# Patient Record
Sex: Male | Born: 1945 | Race: White | Hispanic: No | Marital: Married | Smoking: Never smoker
Health system: Southern US, Community
[De-identification: ages and names within clinical notes are randomized; demographics above are authoritative.]

## PROBLEM LIST (undated history)

## (undated) DIAGNOSIS — I10 Essential (primary) hypertension: Secondary | ICD-10-CM

## (undated) DIAGNOSIS — I509 Heart failure, unspecified: Secondary | ICD-10-CM

## (undated) DIAGNOSIS — K602 Anal fissure, unspecified: Secondary | ICD-10-CM

## (undated) DIAGNOSIS — K5900 Constipation, unspecified: Secondary | ICD-10-CM

## (undated) DIAGNOSIS — G4733 Obstructive sleep apnea (adult) (pediatric): Secondary | ICD-10-CM

## (undated) DIAGNOSIS — G4730 Sleep apnea, unspecified: Secondary | ICD-10-CM

## (undated) DIAGNOSIS — L4050 Arthropathic psoriasis, unspecified: Secondary | ICD-10-CM

## (undated) DIAGNOSIS — H9319 Tinnitus, unspecified ear: Secondary | ICD-10-CM

## (undated) DIAGNOSIS — Z8739 Personal history of other diseases of the musculoskeletal system and connective tissue: Secondary | ICD-10-CM

## (undated) DIAGNOSIS — R011 Cardiac murmur, unspecified: Secondary | ICD-10-CM

## (undated) DIAGNOSIS — M1990 Unspecified osteoarthritis, unspecified site: Secondary | ICD-10-CM

## (undated) DIAGNOSIS — Z85828 Personal history of other malignant neoplasm of skin: Secondary | ICD-10-CM

## (undated) HISTORY — PX: OTHER SURGICAL HISTORY: SHX169

## (undated) HISTORY — DX: Essential (primary) hypertension: I10

## (undated) HISTORY — DX: Anal fissure, unspecified: K60.2

## (undated) HISTORY — DX: Sleep apnea, unspecified: G47.30

## (undated) HISTORY — DX: Cardiac murmur, unspecified: R01.1

## (undated) HISTORY — PX: TONSILLECTOMY: SUR1361

## (undated) HISTORY — DX: Obstructive sleep apnea (adult) (pediatric): G47.33

## (undated) HISTORY — DX: Unspecified osteoarthritis, unspecified site: M19.90

## (undated) HISTORY — PX: LUNG BIOPSY: SHX232

---

## 1997-09-30 ENCOUNTER — Ambulatory Visit (HOSPITAL_COMMUNITY): Admission: RE | Admit: 1997-09-30 | Discharge: 1997-09-30 | Payer: Self-pay | Admitting: Pulmonary Disease

## 1997-09-30 ENCOUNTER — Encounter: Payer: Self-pay | Admitting: Pulmonary Disease

## 1998-01-24 ENCOUNTER — Encounter: Payer: Self-pay | Admitting: Pulmonary Disease

## 1998-01-24 ENCOUNTER — Ambulatory Visit: Admission: RE | Admit: 1998-01-24 | Discharge: 1998-01-24 | Payer: Self-pay | Admitting: Pulmonary Disease

## 2001-07-09 ENCOUNTER — Ambulatory Visit: Admission: RE | Admit: 2001-07-09 | Discharge: 2001-07-09 | Payer: Self-pay | Admitting: Internal Medicine

## 2005-01-16 ENCOUNTER — Ambulatory Visit (HOSPITAL_COMMUNITY): Admission: RE | Admit: 2005-01-16 | Discharge: 2005-01-16 | Payer: Self-pay | Admitting: Internal Medicine

## 2005-01-17 ENCOUNTER — Ambulatory Visit (HOSPITAL_COMMUNITY): Admission: RE | Admit: 2005-01-17 | Discharge: 2005-01-17 | Payer: Self-pay | Admitting: Internal Medicine

## 2006-02-22 ENCOUNTER — Encounter: Admission: RE | Admit: 2006-02-22 | Discharge: 2006-02-22 | Payer: Self-pay | Admitting: Internal Medicine

## 2006-02-28 ENCOUNTER — Encounter: Admission: RE | Admit: 2006-02-28 | Discharge: 2006-02-28 | Payer: Self-pay | Admitting: Internal Medicine

## 2006-11-21 ENCOUNTER — Encounter: Admission: RE | Admit: 2006-11-21 | Discharge: 2006-11-21 | Payer: Self-pay | Admitting: Internal Medicine

## 2008-06-14 ENCOUNTER — Ambulatory Visit (HOSPITAL_COMMUNITY): Admission: RE | Admit: 2008-06-14 | Discharge: 2008-06-14 | Payer: Self-pay | Admitting: Pulmonary Disease

## 2009-04-04 DIAGNOSIS — D869 Sarcoidosis, unspecified: Secondary | ICD-10-CM | POA: Insufficient documentation

## 2009-04-04 DIAGNOSIS — I10 Essential (primary) hypertension: Secondary | ICD-10-CM | POA: Insufficient documentation

## 2009-04-04 DIAGNOSIS — G4733 Obstructive sleep apnea (adult) (pediatric): Secondary | ICD-10-CM | POA: Insufficient documentation

## 2009-04-04 DIAGNOSIS — E119 Type 2 diabetes mellitus without complications: Secondary | ICD-10-CM | POA: Insufficient documentation

## 2009-04-05 ENCOUNTER — Ambulatory Visit: Payer: Self-pay | Admitting: Pulmonary Disease

## 2009-05-16 ENCOUNTER — Encounter: Payer: Self-pay | Admitting: Pulmonary Disease

## 2009-05-17 ENCOUNTER — Encounter: Payer: Self-pay | Admitting: Pulmonary Disease

## 2009-12-27 ENCOUNTER — Ambulatory Visit: Payer: Self-pay | Admitting: Cardiology

## 2010-01-02 ENCOUNTER — Telehealth (INDEPENDENT_AMBULATORY_CARE_PROVIDER_SITE_OTHER): Payer: Self-pay | Admitting: *Deleted

## 2010-01-03 ENCOUNTER — Encounter (HOSPITAL_COMMUNITY)
Admission: RE | Admit: 2010-01-03 | Discharge: 2010-03-14 | Payer: Self-pay | Source: Home / Self Care | Attending: Cardiology | Admitting: Cardiology

## 2010-01-03 ENCOUNTER — Encounter: Payer: Self-pay | Admitting: Internal Medicine

## 2010-01-03 ENCOUNTER — Ambulatory Visit: Payer: Self-pay

## 2010-01-03 ENCOUNTER — Ambulatory Visit: Payer: Self-pay | Admitting: Internal Medicine

## 2010-01-04 ENCOUNTER — Ambulatory Visit: Payer: Self-pay

## 2010-01-27 ENCOUNTER — Ambulatory Visit (HOSPITAL_BASED_OUTPATIENT_CLINIC_OR_DEPARTMENT_OTHER): Payer: BC Managed Care – PPO | Admitting: Oncology

## 2010-02-04 ENCOUNTER — Emergency Department (HOSPITAL_BASED_OUTPATIENT_CLINIC_OR_DEPARTMENT_OTHER)
Admission: EM | Admit: 2010-02-04 | Discharge: 2010-02-04 | Payer: Self-pay | Source: Home / Self Care | Admitting: Emergency Medicine

## 2010-02-12 HISTORY — PX: JOINT REPLACEMENT: SHX530

## 2010-02-23 LAB — CBC WITH DIFFERENTIAL/PLATELET
BASO%: 0.4 % (ref 0.0–2.0)
Basophils Absolute: 0 10*3/uL (ref 0.0–0.1)
EOS%: 3.8 % (ref 0.0–7.0)
Eosinophils Absolute: 0.3 10*3/uL (ref 0.0–0.5)
HCT: 50 % — ABNORMAL HIGH (ref 38.4–49.9)
HGB: 17.4 g/dL — ABNORMAL HIGH (ref 13.0–17.1)
LYMPH%: 15.9 % (ref 14.0–49.0)
MCH: 30.6 pg (ref 27.2–33.4)
MCHC: 34.8 g/dL (ref 32.0–36.0)
MCV: 87.9 fL (ref 79.3–98.0)
MONO#: 0.6 10*3/uL (ref 0.1–0.9)
MONO%: 7.9 % (ref 0.0–14.0)
NEUT#: 5.1 10*3/uL (ref 1.5–6.5)
NEUT%: 72 % (ref 39.0–75.0)
Platelets: 157 10*3/uL (ref 140–400)
RBC: 5.69 10*6/uL (ref 4.20–5.82)
RDW: 13.6 % (ref 11.0–14.6)
WBC: 7.1 10*3/uL (ref 4.0–10.3)
lymph#: 1.1 10*3/uL (ref 0.9–3.3)
nRBC: 0 % (ref 0–0)

## 2010-02-23 LAB — MORPHOLOGY: PLT EST: ADEQUATE

## 2010-02-23 LAB — CHCC SMEAR

## 2010-02-24 LAB — COMPREHENSIVE METABOLIC PANEL
ALT: 16 U/L (ref 0–53)
AST: 19 U/L (ref 0–37)
Albumin: 4.3 g/dL (ref 3.5–5.2)
Alkaline Phosphatase: 59 U/L (ref 39–117)
BUN: 16 mg/dL (ref 6–23)
CO2: 27 mEq/L (ref 19–32)
Calcium: 9.4 mg/dL (ref 8.4–10.5)
Chloride: 100 mEq/L (ref 96–112)
Creatinine, Ser: 1.06 mg/dL (ref 0.40–1.50)
Glucose, Bld: 111 mg/dL — ABNORMAL HIGH (ref 70–99)
Potassium: 3.8 mEq/L (ref 3.5–5.3)
Sodium: 137 mEq/L (ref 135–145)
Total Bilirubin: 1.3 mg/dL — ABNORMAL HIGH (ref 0.3–1.2)
Total Protein: 6.3 g/dL (ref 6.0–8.3)

## 2010-02-24 LAB — LACTATE DEHYDROGENASE: LDH: 171 U/L (ref 94–250)

## 2010-02-24 LAB — URIC ACID: Uric Acid, Serum: 8.1 mg/dL — ABNORMAL HIGH (ref 4.0–7.8)

## 2010-02-24 LAB — ERYTHROPOIETIN: Erythropoietin: 18.4 m[IU]/mL (ref 2.6–34.0)

## 2010-02-28 LAB — JAK-2 V617F

## 2010-03-14 NOTE — Progress Notes (Signed)
Summary: Nuclear pre procedure  Phone Note Outgoing Call Call back at [facility] Phone [phone]   Call placed by: Rea College, CMA,  January 02, 2010 4:30 PM Call placed to: Patient Summary of Call: Left message with information on [employer] Information Sheet (see scanned document for details).      Nuclear Med Background Indications for Stress Test: Evaluation for Ischemia, Surgical Clearance  Indications Comments: Pending (R) TKR by Dr. Dimas Aguas  History: Echo, Myocardial Perfusion Study  History Comments: '02 [id number], EF=51%  Symptoms: SOB    Nuclear Pre-Procedure Cardiac Risk Factors: Hypertension, NIDDM, Obesity Height (in): 71

## 2010-03-14 NOTE — Miscellaneous (Signed)
Summary: Orders Update  Clinical Lists Changes  Orders: Added new Referral order of DME Referral (DME) - Signed 

## 2010-03-14 NOTE — Letter (Signed)
Summary: External Correspondence  External Correspondence   Imported By: Valinda Hoar 05/17/2009 08:38:32  _____________________________________________________________________  External Attachment:    Type:   Image     Comment:   External Document

## 2010-03-14 NOTE — Assessment & Plan Note (Signed)
Summary: re-establish for management of osa   Copy to:  [place] Primary Provider/Referring Provider:  Marisue Brooklyn  CC:  Sleep Apnea Cons - Former pt - Pt still wears CPAP.  History of Present Illness: The pt comes in today to re-establish for management of his osa.  He was diagnosed with severe osa in 1999, and has been on cpap since that time.  He was last seen in 2005, but has been very compliant with his device.  He has kept up with supplies and mask exchanges.  He has lost 35 pounds since the last visit with me.  He feels that he is sleeping well, and that he is getting restorative sleep, but may not be sleeping quite as well as in past.   Current Medications (verified): 1)  Aspirin Low Dose 81 Mg Tabs (Aspirin) .... Take 1 Tablet By Mouth Once A Day 2)  Actos 30 Mg Tabs (Pioglitazone Hcl) .... Take 1 Tablet By Mouth Once A Day 3)  Atenolol 50 Mg Tabs (Atenolol) .... Take 1 Tablet By Mouth Once A Day 4)  Ramipril 5 Mg Caps (Ramipril) .... Take One By Mouth Once Daily 5)  Maxzide 75-50 Mg Tabs (Triamterene-Hctz) .... Take 1 Tablet By Mouth Once A Day 6)  Zocor 40 Mg Tabs (Simvastatin) .... Take 1 Tablet By Mouth Once A Day 7)  Vitamin D 2000 Unit Caps (Cholecalciferol) .... Take One By Mouth Once Daily Monday Through Friday 8)  Potassium Chloride 20 Meq Pack (Potassium Chloride) .... Take One  By Mouth Once Daily 9)  Hyalgan 20 Mg/31ml Soln (Sodium Hyaluronate (Viscosup)) .... Injection Into Knee As Needed 10)  Aleve 220 Mg Tabs (Naproxen Sodium) .... Take As Needed  Allergies (verified): 1)  ! Levaquin 2)  ! Penicillin  Past History:  Past Medical History: Last updated: 04/04/2009 Current Problems:  DM (ICD-250.00) HYPERTENSION (ICD-401.9) SARCOIDOSIS (ICD-135) OBSTRUCTIVE SLEEP APNEA (ICD-327.23)    Family History: emphysema: father clotting disorder: father  heart disease : mother  Social History: Pt retired.  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    Married  Children not living at home. Alcohol - 1 or 2 per mth  Review of Systems       The patient complains of hand/feet swelling.  The patient denies shortness of breath with activity, shortness of breath at rest, productive cough, non-productive cough, coughing up blood, chest pain, irregular heartbeats, acid heartburn, indigestion, loss of appetite, weight change, abdominal pain, difficulty swallowing, sore throat, tooth/dental problems, headaches, nasal congestion/difficulty breathing through nose, sneezing, itching, ear ache, anxiety, depression, joint stiffness or pain, rash, change in color of mucus, and fever.    Vital Signs:  Patient profile:   65 year old male Height:      71 inches Weight:      330.13 pounds BMI:     46.21 O2 Sat:      97 % on Room air Temp:     98.1 degrees F oral Pulse rate:   65 / minute BP sitting:   102 / 66  (right arm) Cuff size:   large  Vitals Entered By: Abigail Miyamoto RN (April 05, 2009 11:33 AM)  O2 Flow:  Room air  Physical Exam  General:  obese male in nad Nose:  no skin breakdown or pressure necrosis from cpap mask Lungs:  clear Heart:  rrr Extremities:  mild edema, but no cyanosis Neurologic:  alert, but mildly sleepy, moves all 4.   Impression & Recommendations:  Problem # 1:  OBSTRUCTIVE SLEEP APNEA (ICD-327.23) the pt has a known h/o severe osa, and has been wearing cpap compliantly.  He feels that he sleep well, but not as good as in the past.  His weight is actually down since the last visit here.  It is unclear if he could be losing pressure from mouth opening, or whether his pressure needs have changed.  He has not had his pressure checked in a long time, and would take this opportunity to do so with an autotitrating device at home.  I will let him know his optimal pressure.  I have also encouraged him to continue working aggressively on weight loss.  Medications Added to Medication List This Visit: 1)  Ramipril 5 Mg  Caps (Ramipril) .... Take one by mouth once daily 2)  Vitamin D 2000 Unit Caps (Cholecalciferol) .... Take one by mouth once daily monday through friday 3)  Potassium Chloride 20 Meq Pack (Potassium chloride) .... Take one  by mouth once daily 4)  Hyalgan 20 Mg/62ml Soln (Sodium hyaluronate (viscosup)) .... Injection into knee as needed 5)  Aleve 220 Mg Tabs (Naproxen sodium) .... Take as needed  Other Orders: Est. Patient Level IV (29562) DME Referral (DME)  Patient Instructions: 1)  will recheck your pressure with an auto device at home 2)  pay attention to whether you could be "mouth opening" while wearing cpap. 3)  will call you with results of autotitration. 4)  work on weight loss 5)  followup with me in one year.

## 2010-03-16 NOTE — Assessment & Plan Note (Signed)
Summary: Cardiology Nuclear Testing  Nuclear Med Background Indications for Stress Test: Evaluation for Ischemia, Surgical Clearance  Indications Comments: Pending (R) TKR by Dr. Dimas Aguas  History: Echo, Myocardial Perfusion Study  History Comments: '02 [id number], EF=51%  Symptoms: DOE, SOB    Nuclear Pre-Procedure Cardiac Risk Factors: Hypertension, NIDDM, Obesity Caffeine/Decaff Intake: none NPO After: 7:00 PM Lungs: clear IV 0.9% NS with Angio Cath: 22g     IV Site: R Hand IV Started by: Cathlyn Parsons, RN Chest Size (in) 58     Height (in): 71 Weight (lb): 324 BMI: 45.35 Tech Comments: Tenormin held x 24hrs  Nuclear Med Study 1 or 2 day study:  2 day     Stress Test Type:  Treadmill/Lexiscan Reading MD:  Marca Ancona, MD     Referring MD:  S.Tennant Resting Radionuclide:  Technetium 79m Tetrofosmin     Resting Radionuclide Dose:  33 mCi  Stress Radionuclide:  Technetium 30m Tetrofosmin     Stress Radionuclide Dose:  33 mCi   Stress Protocol  Max Systolic BP: 129 mm Hg Lexiscan: 0.4 mg   Stress Test Technologist:  Milana Na, EMT-P     Nuclear Technologist:  Doyne Keel, CNMT  Rest Procedure  Myocardial perfusion imaging was performed at rest 45 minutes following the intravenous administration of Technetium 63m Tetrofosmin.  Stress Procedure  The patient received IV Lexiscan 0.4 mg over 15-seconds.  Technetium 71m Tetrofosmin injected at 30-seconds.  There were no significant changes with infusion.  Quantitative spect images were obtained after a 45 minute delay.  QPS Raw Data Images:  Normal; no motion artifact; normal heart/lung ratio. Stress Images:  Mild thinning of the inferior wall Rest Images:  Mild thinning of the inferior wall Subtraction (SDS):  There is thinning of the inferior wall worse of rest than stress. Suspect diaphragmatic attenuation. Cannot rule out previous infarct. No ischemia. Transient Ischemic Dilatation:  1.13  (Normal  <1.22)  Lung/Heart Ratio:  0.48  (Normal <0.45)  Quantitative Gated Spect Images QGS EDV:  110 ml QGS ESV:  43 ml QGS EF:  61 % QGS cine images:  Normal  Findings Normal nuclear study      Overall Impression  Exercise Capacity: Lexiscan with no exercise. ECG Impression: No significant ST segment change suggestive of ischemia. Overall Impression: Normal stress nuclear study. Overall Impression Comments: There is thinning of the inferior wall worse on rest than stress. Suspect diaphragmatic attenuation. Cannot rule out previous infarct. No ischemia.

## 2010-03-31 ENCOUNTER — Encounter: Payer: Medicare Other | Admitting: Oncology

## 2010-03-31 DIAGNOSIS — D751 Secondary polycythemia: Secondary | ICD-10-CM

## 2010-03-31 LAB — CBC WITH DIFFERENTIAL/PLATELET
BASO%: 1.3 % (ref 0.0–2.0)
Basophils Absolute: 0.1 10*3/uL (ref 0.0–0.1)
EOS%: 3.8 % (ref 0.0–7.0)
Eosinophils Absolute: 0.2 10*3/uL (ref 0.0–0.5)
HCT: 47.7 % (ref 38.4–49.9)
HGB: 16.7 g/dL (ref 13.0–17.1)
LYMPH%: 17.9 % (ref 14.0–49.0)
MCH: 31.5 pg (ref 27.2–33.4)
MCHC: 35 g/dL (ref 32.0–36.0)
MCV: 90.1 fL (ref 79.3–98.0)
MONO#: 0.5 10*3/uL (ref 0.1–0.9)
MONO%: 7.9 % (ref 0.0–14.0)
NEUT#: 4.5 10*3/uL (ref 1.5–6.5)
NEUT%: 69.1 % (ref 39.0–75.0)
Platelets: 167 10*3/uL (ref 140–400)
RBC: 5.3 10*6/uL (ref 4.20–5.82)
RDW: 14.2 % (ref 11.0–14.6)
WBC: 6.4 10*3/uL (ref 4.0–10.3)
lymph#: 1.2 10*3/uL (ref 0.9–3.3)

## 2010-04-04 ENCOUNTER — Ambulatory Visit: Payer: Self-pay | Admitting: Pulmonary Disease

## 2010-05-17 ENCOUNTER — Emergency Department (HOSPITAL_BASED_OUTPATIENT_CLINIC_OR_DEPARTMENT_OTHER)
Admission: EM | Admit: 2010-05-17 | Discharge: 2010-05-17 | Disposition: A | Discharge status: Home / Self Care | Payer: Medicare Other | Attending: Emergency Medicine | Admitting: Emergency Medicine

## 2010-05-17 DIAGNOSIS — G4730 Sleep apnea, unspecified: Secondary | ICD-10-CM | POA: Insufficient documentation

## 2010-05-17 DIAGNOSIS — E119 Type 2 diabetes mellitus without complications: Secondary | ICD-10-CM | POA: Insufficient documentation

## 2010-05-17 DIAGNOSIS — I10 Essential (primary) hypertension: Secondary | ICD-10-CM | POA: Insufficient documentation

## 2010-05-17 DIAGNOSIS — M25579 Pain in unspecified ankle and joints of unspecified foot: Secondary | ICD-10-CM | POA: Insufficient documentation

## 2010-05-17 DIAGNOSIS — IMO0002 Reserved for concepts with insufficient information to code with codable children: Secondary | ICD-10-CM | POA: Insufficient documentation

## 2010-05-17 DIAGNOSIS — Z79899 Other long term (current) drug therapy: Secondary | ICD-10-CM | POA: Insufficient documentation

## 2010-05-23 ENCOUNTER — Other Ambulatory Visit (HOSPITAL_COMMUNITY): Payer: Self-pay | Admitting: Oncology

## 2010-05-23 ENCOUNTER — Encounter (HOSPITAL_BASED_OUTPATIENT_CLINIC_OR_DEPARTMENT_OTHER): Payer: Medicare Other | Admitting: Oncology

## 2010-05-23 DIAGNOSIS — Z96659 Presence of unspecified artificial knee joint: Secondary | ICD-10-CM

## 2010-05-23 DIAGNOSIS — D751 Secondary polycythemia: Secondary | ICD-10-CM

## 2010-05-23 LAB — COMPREHENSIVE METABOLIC PANEL
ALT: 13 U/L (ref 0–53)
AST: 18 U/L (ref 0–37)
Albumin: 3.6 g/dL (ref 3.5–5.2)
Alkaline Phosphatase: 61 U/L (ref 39–117)
BUN: 15 mg/dL (ref 6–23)
CO2: 27 mEq/L (ref 19–32)
Calcium: 9.2 mg/dL (ref 8.4–10.5)
Chloride: 102 mEq/L (ref 96–112)
Creatinine, Ser: 1.07 mg/dL (ref 0.40–1.50)
Glucose, Bld: 145 mg/dL — ABNORMAL HIGH (ref 70–99)
Potassium: 4 mEq/L (ref 3.5–5.3)
Sodium: 140 mEq/L (ref 135–145)
Total Bilirubin: 1 mg/dL (ref 0.3–1.2)
Total Protein: 5.6 g/dL — ABNORMAL LOW (ref 6.0–8.3)

## 2010-05-23 LAB — CBC WITH DIFFERENTIAL/PLATELET
BASO%: 0.6 % (ref 0.0–2.0)
Basophils Absolute: 0 10*3/uL (ref 0.0–0.1)
EOS%: 6.1 % (ref 0.0–7.0)
Eosinophils Absolute: 0.3 10*3/uL (ref 0.0–0.5)
HCT: 43.1 % (ref 38.4–49.9)
HGB: 14.5 g/dL (ref 13.0–17.1)
LYMPH%: 14.3 % (ref 14.0–49.0)
MCH: 30.4 pg (ref 27.2–33.4)
MCHC: 33.7 g/dL (ref 32.0–36.0)
MCV: 90.2 fL (ref 79.3–98.0)
MONO#: 0.3 10*3/uL (ref 0.1–0.9)
MONO%: 6.4 % (ref 0.0–14.0)
NEUT#: 3.9 10*3/uL (ref 1.5–6.5)
NEUT%: 72.6 % (ref 39.0–75.0)
Platelets: 194 10*3/uL (ref 140–400)
RBC: 4.78 10*6/uL (ref 4.20–5.82)
RDW: 14.1 % (ref 11.0–14.6)
WBC: 5.4 10*3/uL (ref 4.0–10.3)
lymph#: 0.8 10*3/uL — ABNORMAL LOW (ref 0.9–3.3)

## 2010-05-23 LAB — LACTATE DEHYDROGENASE: LDH: 164 U/L (ref 94–250)

## 2010-05-29 ENCOUNTER — Encounter: Payer: Self-pay | Admitting: Pulmonary Disease

## 2010-05-30 ENCOUNTER — Encounter: Payer: Self-pay | Admitting: Pulmonary Disease

## 2010-05-30 ENCOUNTER — Ambulatory Visit (INDEPENDENT_AMBULATORY_CARE_PROVIDER_SITE_OTHER): Payer: Medicare Other | Admitting: Pulmonary Disease

## 2010-05-30 VITALS — BP 124/70 | HR 81 | Temp 97.6°F | Ht 72.0 in | Wt 349.8 lb

## 2010-05-30 DIAGNOSIS — G4733 Obstructive sleep apnea (adult) (pediatric): Secondary | ICD-10-CM

## 2010-05-30 NOTE — Assessment & Plan Note (Signed)
The pt is wearing cpap compliantly, but has been noted to have polycythemia.  His weight has increased almost 20 pounds since last visit, and I suspect his pressure needs have increased.  He is also due for a new machine.  At this point, will get him a new machine and set on auto to reoptimize his pressure.  Once this is done, will do ONO on cpap to verify we are maintaining adequate sats.

## 2010-05-30 NOTE — Patient Instructions (Signed)
Will get you a new cpap machine, and set on auto for the next 2 weeks to optimize your pressure.  Call us when download done to give [place] a heads up. After optimizing pressure, will then check oxygen level at night. Work on weight loss followup with me in one year, or sooner if having issues.

## 2010-05-30 NOTE — Progress Notes (Signed)
  Subjective:    Patient ID: Gary Butler, male    DOB: 05-10-1945, 65 y.o.   MRN: [id number]  HPI The pt comes in today for f/u of his known osa.  He is wearing cpap compliantly, and is having no issues with mask fit or pressure.  He feels he is sleeping well, and is satisfied with his daytime alertness.  He is due for a new mask and a new machine.  A new issue for him is polycythemia, and he is being followed by hematology.  The question was raised whether his pressure may be adequate, and whether he may be having desats at night.  He also has been having some LE edema.    Review of Systems  Constitutional: Negative for fever and unexpected weight change.  HENT: Negative for ear pain, nosebleeds, congestion, sore throat, rhinorrhea, sneezing, trouble swallowing, dental problem, postnasal drip and sinus pressure.   Eyes: Negative for redness and itching.  Respiratory: Negative for cough, chest tightness, shortness of breath and wheezing.   Cardiovascular: Positive for leg swelling. Negative for palpitations.  Gastrointestinal: Negative for nausea and vomiting.  Genitourinary: Negative for dysuria.  Musculoskeletal: Positive for joint swelling.  Skin: Negative for rash.  Neurological: Negative for headaches.  Hematological: Does not bruise/bleed easily.  Psychiatric/Behavioral: Negative for dysphoric mood. The patient is not nervous/anxious.        Objective:   Physical Exam Obese male in nad No skin breakdown or pressure necrosis from cpap mask. LE with 1+ edema bilat, no cyanosis Alert and oriented, moves all 4.  Does not appear sleepy.       Assessment & Plan:

## 2010-07-18 ENCOUNTER — Encounter (HOSPITAL_BASED_OUTPATIENT_CLINIC_OR_DEPARTMENT_OTHER): Payer: Medicare Other | Admitting: Oncology

## 2010-07-18 ENCOUNTER — Other Ambulatory Visit (HOSPITAL_COMMUNITY): Payer: Self-pay | Admitting: Oncology

## 2010-07-18 DIAGNOSIS — D751 Secondary polycythemia: Secondary | ICD-10-CM

## 2010-07-18 LAB — CBC WITH DIFFERENTIAL/PLATELET
BASO%: 0.3 % (ref 0.0–2.0)
Basophils Absolute: 0 10*3/uL (ref 0.0–0.1)
EOS%: 3.5 % (ref 0.0–7.0)
Eosinophils Absolute: 0.3 10*3/uL (ref 0.0–0.5)
HCT: 45.8 % (ref 38.4–49.9)
HGB: 15.5 g/dL (ref 13.0–17.1)
LYMPH%: 15.3 % (ref 14.0–49.0)
MCH: 29.1 pg (ref 27.2–33.4)
MCHC: 33.8 g/dL (ref 32.0–36.0)
MCV: 86.1 fL (ref 79.3–98.0)
MONO#: 0.5 10*3/uL (ref 0.1–0.9)
MONO%: 6.2 % (ref 0.0–14.0)
NEUT#: 5.4 10*3/uL (ref 1.5–6.5)
NEUT%: 74.7 % (ref 39.0–75.0)
Platelets: 168 10*3/uL (ref 140–400)
RBC: 5.32 10*6/uL (ref 4.20–5.82)
RDW: 14.5 % (ref 11.0–14.6)
WBC: 7.2 10*3/uL (ref 4.0–10.3)
lymph#: 1.1 10*3/uL (ref 0.9–3.3)
nRBC: 0 % (ref 0–0)

## 2010-07-24 ENCOUNTER — Telehealth: Payer: Self-pay | Admitting: Pulmonary Disease

## 2010-07-24 NOTE — Telephone Encounter (Signed)
Spoke with pt.  He states needing CPAP download results. I advised KC out of the office this wk, and that I do not see where results are back yet, but will forward him the msg and he will address it next wk when returns. Pt okay with this.

## 2010-07-31 ENCOUNTER — Other Ambulatory Visit: Payer: Self-pay | Admitting: Pulmonary Disease

## 2010-07-31 DIAGNOSIS — G4733 Obstructive sleep apnea (adult) (pediatric): Secondary | ICD-10-CM

## 2010-07-31 NOTE — Telephone Encounter (Signed)
Let pt know that his optimal pressure is 15cm Will get cpap set to that pressure, then check ONO to make sure we are keeping his oxygen level up to an acceptable level.  Have him call us as soon as the dme picks up his ONO box, so we can stay on top of getting download in a timely fashion.

## 2010-08-01 NOTE — Telephone Encounter (Signed)
Pt aware. Jennifer Castillo, CMA  

## 2010-10-22 ENCOUNTER — Emergency Department (HOSPITAL_BASED_OUTPATIENT_CLINIC_OR_DEPARTMENT_OTHER)
Admission: EM | Admit: 2010-10-22 | Discharge: 2010-10-22 | Disposition: A | Discharge status: Home / Self Care | Payer: Medicare Other | Attending: Emergency Medicine | Admitting: Emergency Medicine

## 2010-10-22 ENCOUNTER — Encounter (HOSPITAL_BASED_OUTPATIENT_CLINIC_OR_DEPARTMENT_OTHER): Payer: Self-pay | Admitting: *Deleted

## 2010-10-22 ENCOUNTER — Emergency Department (INDEPENDENT_AMBULATORY_CARE_PROVIDER_SITE_OTHER): Payer: Medicare Other

## 2010-10-22 DIAGNOSIS — I10 Essential (primary) hypertension: Secondary | ICD-10-CM | POA: Insufficient documentation

## 2010-10-22 DIAGNOSIS — L089 Local infection of the skin and subcutaneous tissue, unspecified: Secondary | ICD-10-CM | POA: Insufficient documentation

## 2010-10-22 DIAGNOSIS — R609 Edema, unspecified: Secondary | ICD-10-CM

## 2010-10-22 DIAGNOSIS — G4733 Obstructive sleep apnea (adult) (pediatric): Secondary | ICD-10-CM | POA: Insufficient documentation

## 2010-10-22 DIAGNOSIS — E119 Type 2 diabetes mellitus without complications: Secondary | ICD-10-CM | POA: Insufficient documentation

## 2010-10-22 DIAGNOSIS — L539 Erythematous condition, unspecified: Secondary | ICD-10-CM

## 2010-10-22 DIAGNOSIS — B369 Superficial mycosis, unspecified: Secondary | ICD-10-CM

## 2010-10-22 DIAGNOSIS — M79609 Pain in unspecified limb: Secondary | ICD-10-CM | POA: Insufficient documentation

## 2010-10-22 LAB — DIFFERENTIAL
Basophils Absolute: 0 10*3/uL (ref 0.0–0.1)
Basophils Relative: 0 % (ref 0–1)
Eosinophils Absolute: 0.3 10*3/uL (ref 0.0–0.7)
Eosinophils Relative: 4 % (ref 0–5)
Lymphocytes Relative: 13 % (ref 12–46)
Lymphs Abs: 0.9 10*3/uL (ref 0.7–4.0)
Monocytes Absolute: 0.5 10*3/uL (ref 0.1–1.0)
Monocytes Relative: 7 % (ref 3–12)
Neutro Abs: 5.3 10*3/uL (ref 1.7–7.7)
Neutrophils Relative %: 75 % (ref 43–77)

## 2010-10-22 LAB — COMPREHENSIVE METABOLIC PANEL
ALT: 15 U/L (ref 0–53)
AST: 24 U/L (ref 0–37)
Albumin: 3.7 g/dL (ref 3.5–5.2)
Alkaline Phosphatase: 79 U/L (ref 39–117)
BUN: 16 mg/dL (ref 6–23)
CO2: 29 mEq/L (ref 19–32)
Calcium: 9.7 mg/dL (ref 8.4–10.5)
Chloride: 99 mEq/L (ref 96–112)
Creatinine, Ser: 0.8 mg/dL (ref 0.50–1.35)
GFR calc Af Amer: >60 mL/min (ref >60)
GFR calc non Af Amer: >60 mL/min (ref >60)
Glucose, Bld: 148 mg/dL — ABNORMAL HIGH (ref 70–99)
Potassium: 3.3 mEq/L — ABNORMAL LOW (ref 3.5–5.1)
Sodium: 138 mEq/L (ref 135–145)
Total Bilirubin: 1 mg/dL (ref 0.3–1.2)
Total Protein: 6.5 g/dL (ref 6.0–8.3)

## 2010-10-22 LAB — CBC
HCT: 47.2 % (ref 39.0–52.0)
Hemoglobin: 16.7 g/dL (ref 13.0–17.0)
MCH: 30.6 pg (ref 26.0–34.0)
MCHC: 35.4 g/dL (ref 30.0–36.0)
MCV: 86.6 fL (ref 78.0–100.0)
Platelets: 160 10*3/uL (ref 150–400)
RBC: 5.45 MIL/uL (ref 4.22–5.81)
RDW: 15.1 % (ref 11.5–15.5)
WBC: 7.1 10*3/uL (ref 4.0–10.5)

## 2010-10-22 MED ORDER — DOXYCYCLINE HYCLATE 100 MG PO TABS: 100.0000 mg | ORAL_TABLET | Freq: Once | ORAL | Status: DC

## 2010-10-22 MED ORDER — HYDROCODONE-ACETAMINOPHEN 5-325 MG PO TABS: 2.0000 | ORAL_TABLET | Freq: Once | ORAL | Status: DC

## 2010-10-22 MED ORDER — DOXYCYCLINE HYCLATE 100 MG PO CAPS: 100.0000 mg | ORAL_CAPSULE | Freq: Two times a day (BID) | ORAL | Status: AC

## 2010-10-22 MED ORDER — DEXTROSE 5 % IV SOLN
1.0000 g | INTRAVENOUS | Status: DC
  Administered 2010-10-22 (×2): 1 g via INTRAVENOUS
  Filled 2010-10-22: qty 1

## 2010-10-22 MED ORDER — CEPHALEXIN 500 MG PO CAPS: 500.0000 mg | ORAL_CAPSULE | Freq: Four times a day (QID) | ORAL | Status: DC

## 2010-10-22 MED ORDER — SODIUM CHLORIDE 0.9 % IV SOLN
Freq: Once | INTRAVENOUS | Status: AC
  Administered 2010-10-22: 17:00:00 via INTRAVENOUS

## 2010-10-22 MED ORDER — CEPHALEXIN 500 MG PO CAPS: 500.0000 mg | ORAL_CAPSULE | Freq: Four times a day (QID) | ORAL | Status: AC

## 2010-10-22 MED ORDER — HYDROCODONE-ACETAMINOPHEN 5-325 MG PO TABS: 2.0000 | ORAL_TABLET | ORAL | Status: AC | PRN

## 2010-10-22 MED ORDER — VANCOMYCIN HCL IN DEXTROSE 1-5 GM/200ML-% IV SOLN
1000.0000 mg | Freq: Once | INTRAVENOUS | Status: AC
  Administered 2010-10-22: 1000 mg via INTRAVENOUS
  Filled 2010-10-22: qty 200

## 2010-10-22 MED ORDER — CEPHALEXIN 250 MG PO CAPS: 250.0000 mg | ORAL_CAPSULE | Freq: Once | ORAL | Status: DC

## 2010-10-22 MED ORDER — DOXYCYCLINE HYCLATE 100 MG PO CAPS: 100.0000 mg | ORAL_CAPSULE | Freq: Two times a day (BID) | ORAL | Status: DC

## 2010-10-22 NOTE — ED Notes (Signed)
Orders discontinued for PO abx and additional doses of IV Rocephin q24hr.  PO abx were supposed to be discharge prescriptions and IV rocephin was one time dose.

## 2010-10-22 NOTE — ED Notes (Signed)
R great toe raised red painful to touch

## 2010-10-22 NOTE — ED Notes (Signed)
Pt states he had a fungus in his right great toe that he was treating with OTC meds. Now toe is red, swollen and painful. Pt is diabetic.

## 2010-10-22 NOTE — ED Provider Notes (Signed)
History     CSN: [id number] Arrival date & time: 10/22/2010  3:36 PM  Chief Complaint  Patient presents with  . Toe Pain   Patient is a 65 y.o. male presenting with toe pain. The history is provided by the patient.  Toe Pain This is a new problem. The current episode started 1 to 4 weeks ago. The problem occurs constantly. The problem has been gradually worsening. Associated symptoms include joint swelling. Pertinent negatives include no rash. The symptoms are aggravated by nothing. He has tried nothing for the symptoms. The treatment provided no relief.  Pt complains of redness and swelling to right 1st toe.  Pt reports area began swelling last week and progressively worsened.    Past Medical History  Diagnosis Date  . Diabetes mellitus   . Hypertension   . Sarcoidosis   . OSA (obstructive sleep apnea)     History reviewed. No pertinent past surgical history.  Family History  Problem Relation Age of Onset  . Emphysema Father   . Clotting disorder Father   . Heart disease Mother     History  Substance Use Topics  . Smoking status: Never Smoker   . Smokeless tobacco: Not on file  . Alcohol Use: Not on file      Review of Systems  Musculoskeletal: Positive for joint swelling.  Skin: Negative for rash and wound.  All other systems reviewed and are negative.    Physical Exam  BP 128/74  Pulse 68  Temp(Src) 98 F (36.7 C) (Oral)  Resp 20  Ht 5\' 11"  (1.803 m)  Wt 312 lb (141.522 kg)  BMI 43.52 kg/m2  SpO2 95%  Physical Exam  Nursing note and vitals reviewed. Constitutional: He is oriented to person, place, and time. He appears well-developed and well-nourished.  Musculoskeletal: He exhibits edema and tenderness.       Swollen red right 1st toe,  Warm to touch,  Neurological: He is alert and oriented to person, place, and time. He has normal reflexes.  Skin: There is erythema.  Psychiatric: He has a normal mood and affect.    ED Course   Procedures  MDM Pt given Rocephin and Vancomycin IV.  Pt advised to see primary Md or recheck here tommorow.      Langston Masker, [place] 10/22/10 [phone]

## 2010-10-22 NOTE — ED Notes (Signed)
Patient ambulates to restroom with assistance from his cane. He has no additional needs at this time

## 2010-10-22 NOTE — ED Notes (Signed)
Patient is resting comfortably.Sterile drsg applied to R great Toe with bacitracin ointment Wound care reviewed with pt Pulse intact brisk capillary refill

## 2010-10-23 NOTE — ED Provider Notes (Signed)
Medical screening examination/treatment/procedure(s) were performed by non-physician practitioner and as supervising physician I was immediately available for consultation/collaboration.   Leigh-Ann Belmont Valli, MD 10/23/10 0133 

## 2010-11-07 ENCOUNTER — Other Ambulatory Visit (HOSPITAL_COMMUNITY): Payer: Self-pay | Admitting: Oncology

## 2010-11-07 ENCOUNTER — Encounter (HOSPITAL_BASED_OUTPATIENT_CLINIC_OR_DEPARTMENT_OTHER): Payer: Medicare Other | Admitting: Oncology

## 2010-11-07 DIAGNOSIS — D751 Secondary polycythemia: Secondary | ICD-10-CM

## 2010-11-07 LAB — CBC WITH DIFFERENTIAL/PLATELET
BASO%: 0.4 % (ref 0.0–2.0)
Basophils Absolute: 0 10*3/uL (ref 0.0–0.1)
EOS%: 5.2 % (ref 0.0–7.0)
Eosinophils Absolute: 0.4 10*3/uL (ref 0.0–0.5)
HCT: 48 % (ref 38.4–49.9)
HGB: 16.5 g/dL (ref 13.0–17.1)
LYMPH%: 13 % — ABNORMAL LOW (ref 14.0–49.0)
MCH: 31.3 pg (ref 27.2–33.4)
MCHC: 34.5 g/dL (ref 32.0–36.0)
MCV: 90.8 fL (ref 79.3–98.0)
MONO#: 0.4 10*3/uL (ref 0.1–0.9)
MONO%: 6.3 % (ref 0.0–14.0)
NEUT#: 5 10*3/uL (ref 1.5–6.5)
NEUT%: 75.1 % — ABNORMAL HIGH (ref 39.0–75.0)
Platelets: 163 10*3/uL (ref 140–400)
RBC: 5.28 10*6/uL (ref 4.20–5.82)
RDW: 15 % — ABNORMAL HIGH (ref 11.0–14.6)
WBC: 6.7 10*3/uL (ref 4.0–10.3)
lymph#: 0.9 10*3/uL (ref 0.9–3.3)

## 2010-11-17 ENCOUNTER — Other Ambulatory Visit (HOSPITAL_COMMUNITY): Payer: Self-pay | Admitting: Oncology

## 2010-11-17 ENCOUNTER — Encounter (HOSPITAL_BASED_OUTPATIENT_CLINIC_OR_DEPARTMENT_OTHER): Payer: Medicare Other | Admitting: Oncology

## 2010-11-17 DIAGNOSIS — D751 Secondary polycythemia: Secondary | ICD-10-CM

## 2010-11-17 LAB — CBC WITH DIFFERENTIAL/PLATELET
BASO%: 0.5 % (ref 0.0–2.0)
Basophils Absolute: 0 10*3/uL (ref 0.0–0.1)
EOS%: 5.3 % (ref 0.0–7.0)
Eosinophils Absolute: 0.4 10*3/uL (ref 0.0–0.5)
HCT: 49.5 % (ref 38.4–49.9)
HGB: 17.1 g/dL (ref 13.0–17.1)
LYMPH%: 15.3 % (ref 14.0–49.0)
MCH: 30.5 pg (ref 27.2–33.4)
MCHC: 34.5 g/dL (ref 32.0–36.0)
MCV: 88.2 fL (ref 79.3–98.0)
MONO#: 0.3 10*3/uL (ref 0.1–0.9)
MONO%: 4.6 % (ref 0.0–14.0)
NEUT#: 4.9 10*3/uL (ref 1.5–6.5)
NEUT%: 74.3 % (ref 39.0–75.0)
Platelets: 141 10*3/uL (ref 140–400)
RBC: 5.61 10*6/uL (ref 4.20–5.82)
RDW: 14.4 % (ref 11.0–14.6)
WBC: 6.6 10*3/uL (ref 4.0–10.3)
lymph#: 1 10*3/uL (ref 0.9–3.3)

## 2010-11-20 ENCOUNTER — Telehealth: Payer: Self-pay | Admitting: Pulmonary Disease

## 2010-11-20 NOTE — Telephone Encounter (Signed)
[email or web address].Jennifer Castillo, CMA  

## 2010-11-21 NOTE — Telephone Encounter (Signed)
Pt states he took his chip from machine into [employer]  for a download 3 months ago and needs this report sent to dr Arline Asp at the fax number he left.  Will look for download, if not here need to call [employer] and get report have dr clance look at it and then fax to dr Arline Asp, once all this is done please call pt and let him know it has been completed.

## 2010-11-23 NOTE — Telephone Encounter (Signed)
Called and spoke with [employer] and had them refax download.  Received download and put in KC's very important look at folder for him to review.

## 2010-11-24 ENCOUNTER — Other Ambulatory Visit: Payer: Self-pay | Admitting: Pulmonary Disease

## 2010-11-24 DIAGNOSIS — G4733 Obstructive sleep apnea (adult) (pediatric): Secondary | ICD-10-CM

## 2010-11-24 NOTE — Telephone Encounter (Signed)
[facility] reviewed download and it was faxed to Dr. Mamie Levers office.  LMOM to inform pt of this.

## 2010-11-27 NOTE — Telephone Encounter (Signed)
Pt returned call & can be reached at [phone].  Gary Butler

## 2010-11-27 NOTE — Telephone Encounter (Signed)
Called, spoke with pt.  I informed him download was received and reviewed by [facility] and was faxed to Dr. Mamie Levers office.  He verbalized understanding of this.  Nothing further needed at this time.

## 2010-11-27 NOTE — Telephone Encounter (Signed)
[email or web address]  

## 2010-12-08 ENCOUNTER — Encounter: Payer: Self-pay | Admitting: Pulmonary Disease

## 2010-12-21 ENCOUNTER — Encounter: Payer: Self-pay | Admitting: Internal Medicine

## 2010-12-22 ENCOUNTER — Encounter: Payer: Self-pay | Admitting: Family Medicine

## 2010-12-22 ENCOUNTER — Ambulatory Visit (INDEPENDENT_AMBULATORY_CARE_PROVIDER_SITE_OTHER): Payer: Medicare Other | Admitting: Family Medicine

## 2010-12-22 DIAGNOSIS — N400 Enlarged prostate without lower urinary tract symptoms: Secondary | ICD-10-CM

## 2010-12-22 DIAGNOSIS — I10 Essential (primary) hypertension: Secondary | ICD-10-CM

## 2010-12-22 DIAGNOSIS — E119 Type 2 diabetes mellitus without complications: Secondary | ICD-10-CM

## 2010-12-22 DIAGNOSIS — M109 Gout, unspecified: Secondary | ICD-10-CM

## 2010-12-22 DIAGNOSIS — E669 Obesity, unspecified: Secondary | ICD-10-CM

## 2010-12-22 DIAGNOSIS — Z96659 Presence of unspecified artificial knee joint: Secondary | ICD-10-CM

## 2010-12-22 DIAGNOSIS — E785 Hyperlipidemia, unspecified: Secondary | ICD-10-CM

## 2010-12-22 DIAGNOSIS — G4733 Obstructive sleep apnea (adult) (pediatric): Secondary | ICD-10-CM

## 2010-12-22 NOTE — Progress Notes (Signed)
  Subjective:    Patient ID: Gary Butler, male    DOB: 1945/03/17, 65 y.o.   MRN: [id number]  HPI New to establish.  Previous MD- Elisabeth Most.  DM- dx'd 15 yrs ago, well controlled by pt report.  A1C running 5.1-6.1.  Following w/ Dr Sharl Ma.  On Actos.  UTD on eye exam (Dr Emily Filbert, sees yearly in June).  No retinopathy.  HTN- chronic problem, on Atenolol (for both BP and migraines), Ramipril, Maxzide.  Was seeing Dr Burgess Estelle ([facility]).  [facility] has since retired.  Typically only sees every 4-5 yrs.  Had recent normal stress test.  Denies CP, SOB, HAs, visual changes, edema.  Hyperlipidemia- chronic problem, on Zocor.  Denies abd pain, N/V, myalgias.  Typically has labs done w/ Dr Sharl Ma at same time as A1C  Gout- infrequent flares, on Allopurinol daily.  Indomethacin as needed.  BPH- on Rapaflo prn for 'flow issues'.  Following w/ Dr Vernie Ammons.  R knee pain- s/p TKR, seeing Dr Dimas Aguas in [place].  Some days requires cane to ambulate.  GI- Medoff (due for colonscopy)  OSA- sees Dr Shelle Iron, using CPAP.  Feels sxs are well controlled.  Obesity- has not seen nutrition in 5-6 yrs, difficulty w/ exercise b/c of diffuse arthritis.  Currently on weight watchers.   Review of Systems For ROS see HPI     Objective:   Physical Exam  Vitals reviewed. Constitutional: He is oriented to person, place, and time. He appears well-developed and well-nourished. No distress.       Morbidly obese  HENT:  Head: Normocephalic and atraumatic.  Eyes: Conjunctivae and EOM are normal. Pupils are equal, round, and reactive to light.  Neck: Normal range of motion. Neck supple. No thyromegaly present.  Cardiovascular: Normal rate, regular rhythm, normal heart sounds and intact distal pulses.   No murmur heard. Pulmonary/Chest: Effort normal and breath sounds normal. No respiratory distress.  Abdominal: Soft. Bowel sounds are normal. He exhibits no distension.  Musculoskeletal: He exhibits no edema.  Lymphadenopathy:    He has  no cervical adenopathy.  Neurological: He is alert and oriented to person, place, and time. No cranial nerve deficit.  Skin: Skin is warm and dry.  Psychiatric: He has a normal mood and affect. His behavior is normal.          Assessment & Plan:

## 2010-12-22 NOTE — Patient Instructions (Signed)
Schedule your complete physical at your convenience Please start to have your specialist send me their notes Call with any questions or concerns Welcome!!  We're glad to have you!!!

## 2010-12-31 ENCOUNTER — Encounter: Payer: Self-pay | Admitting: Family Medicine

## 2010-12-31 DIAGNOSIS — E669 Obesity, unspecified: Secondary | ICD-10-CM | POA: Insufficient documentation

## 2010-12-31 DIAGNOSIS — Z96659 Presence of unspecified artificial knee joint: Secondary | ICD-10-CM | POA: Insufficient documentation

## 2010-12-31 NOTE — Assessment & Plan Note (Signed)
Taking allopurinol daily.  When he has infrequent flare will take Indomethacin.

## 2010-12-31 NOTE — Assessment & Plan Note (Signed)
Chronic problem, following w/ Endo.  UTD on eye exam, taking meds as directed.  Asymptomatic.

## 2010-12-31 NOTE — Assessment & Plan Note (Signed)
Serious problem for pt.  This is compounded by his inability to exercise regularly due to knee pain.  Has recently joined [employer].  Will follow progress closely.

## 2010-12-31 NOTE — Assessment & Plan Note (Signed)
Still has pain.  Will need cane to ambulate at times.  Following w/ ortho.

## 2010-12-31 NOTE — Assessment & Plan Note (Signed)
Following w/ Dr Vernie Ammons regularly.

## 2010-12-31 NOTE — Assessment & Plan Note (Signed)
Following w/ Dr Shelle Iron.  Wearing CPAP nightly.

## 2010-12-31 NOTE — Assessment & Plan Note (Signed)
Chronic problem, on statin.  Pt reports that Endo gets labs regularly.

## 2010-12-31 NOTE — Assessment & Plan Note (Signed)
Chronic problem.  Well controlled on current meds.  Asymptomatic.  Has recently seen cards and had normal stress test.  No changes.

## 2011-01-08 ENCOUNTER — Ambulatory Visit (INDEPENDENT_AMBULATORY_CARE_PROVIDER_SITE_OTHER): Payer: Medicare Other | Admitting: Internal Medicine

## 2011-01-08 ENCOUNTER — Encounter: Payer: Self-pay | Admitting: Internal Medicine

## 2011-01-08 VITALS — BP 110/64 | HR 67 | Temp 97.8°F | Wt 330.0 lb

## 2011-01-08 DIAGNOSIS — J069 Acute upper respiratory infection, unspecified: Secondary | ICD-10-CM

## 2011-01-08 MED ORDER — AZITHROMYCIN 250 MG PO TABS: ORAL_TABLET | ORAL | Status: AC

## 2011-01-08 NOTE — Progress Notes (Signed)
  Subjective:    Patient ID: Gary Butler, male    DOB: [birth year], 65 y.o.   MRN: [id number]  HPI Sick x 3 days: Head, face congestion, sx " going to the chest",  ++ cough. Has a h/o pneumonia, no h/o asthma or tobacco use   Past Medical History  Diagnosis Date  . Diabetes mellitus   . Hypertension   . Sarcoidosis   . OSA (obstructive sleep apnea)   . Arthritis   . Bronchitis, acute   . Heart murmur   . Sleep apnea     Review of Systems No wheezing occ yellow sputum production No  N-V-D No myalgias     Objective:   Physical Exam  Constitutional: He is oriented to person, place, and time. He appears well-developed. No distress.  HENT:  Head: Normocephalic and atraumatic.  Right Ear: External ear normal.  Left Ear: External ear normal.  Mouth/Throat: No oropharyngeal exudate.       Nose congested , face NTTP  Cardiovascular: Normal rate, regular rhythm and normal heart sounds.   No murmur heard. Pulmonary/Chest:       Few ronchi w/cough, no wheezing   Neurological: He is alert and oriented to person, place, and time.  Skin: He is not diaphoretic.  Psychiatric: He has a normal mood and affect. His behavior is normal. Judgment and thought content normal.       Assessment & Plan:  URI: URI-early bronchitis, see instructions

## 2011-01-08 NOTE — Patient Instructions (Signed)
Rest, fluids , tylenol For cough, take Mucinex DM twice a day as needed  If no better in 2 - 3 days, start the antibiotic as prescribed ----> Azithromax Call if no better in few days Call anytime if the symptoms are severe, you have high fever, short of breath

## 2011-01-12 ENCOUNTER — Other Ambulatory Visit: Payer: Medicare Other | Admitting: Lab

## 2011-02-09 ENCOUNTER — Other Ambulatory Visit (HOSPITAL_BASED_OUTPATIENT_CLINIC_OR_DEPARTMENT_OTHER): Payer: Medicare Other | Admitting: Lab

## 2011-02-09 ENCOUNTER — Other Ambulatory Visit (HOSPITAL_COMMUNITY): Payer: Self-pay | Admitting: Oncology

## 2011-02-09 DIAGNOSIS — Z96659 Presence of unspecified artificial knee joint: Secondary | ICD-10-CM

## 2011-02-09 DIAGNOSIS — D751 Secondary polycythemia: Secondary | ICD-10-CM

## 2011-02-09 LAB — CBC WITH DIFFERENTIAL/PLATELET
BASO%: 0.3 % (ref 0.0–2.0)
Basophils Absolute: 0 10*3/uL (ref 0.0–0.1)
EOS%: 4.3 % (ref 0.0–7.0)
Eosinophils Absolute: 0.3 10*3/uL (ref 0.0–0.5)
HCT: 48.8 % (ref 38.4–49.9)
HGB: 16.6 g/dL (ref 13.0–17.1)
LYMPH%: 20 % (ref 14.0–49.0)
MCH: 31.1 pg (ref 27.2–33.4)
MCHC: 33.9 g/dL (ref 32.0–36.0)
MCV: 91.6 fL (ref 79.3–98.0)
MONO#: 0.7 10*3/uL (ref 0.1–0.9)
MONO%: 9.7 % (ref 0.0–14.0)
NEUT#: 4.4 10*3/uL (ref 1.5–6.5)
NEUT%: 65.7 % (ref 39.0–75.0)
Platelets: 177 10*3/uL (ref 140–400)
RBC: 5.33 10*6/uL (ref 4.20–5.82)
RDW: 14.6 % (ref 11.0–14.6)
WBC: 6.8 10*3/uL (ref 4.0–10.3)
lymph#: 1.4 10*3/uL (ref 0.9–3.3)

## 2011-03-14 ENCOUNTER — Encounter: Payer: Self-pay | Admitting: Family Medicine

## 2011-03-14 ENCOUNTER — Ambulatory Visit (INDEPENDENT_AMBULATORY_CARE_PROVIDER_SITE_OTHER): Payer: Medicare Other | Admitting: Family Medicine

## 2011-03-14 VITALS — BP 120/79 | HR 82 | Temp 98.0°F | Ht 70.75 in | Wt 328.6 lb

## 2011-03-14 DIAGNOSIS — J019 Acute sinusitis, unspecified: Secondary | ICD-10-CM

## 2011-03-14 MED ORDER — AZITHROMYCIN 250 MG PO TABS: 250.0000 mg | ORAL_TABLET | Freq: Every day | ORAL | Status: AC

## 2011-03-14 NOTE — Patient Instructions (Signed)
This is a sinus infection and early ear infection Start the Azithromycin as directed Continue the Robitussin as needed Add Mucinex to thin your congestion Drink plenty of fluids REST! Hang in there!!!

## 2011-03-14 NOTE — Progress Notes (Signed)
  Subjective:    Patient ID: Gary Butler, male    DOB: 02-10-1946, 66 y.o.   MRN: [id number]  HPI Sinusitis- sxs started 10 days ago.  + nasal congestion, PND, productive cough- particularly in AM.  + HA, mild facial pain.  No ear pain.  No fevers.  + sick contacts.  No N/V/D.   Review of Systems For ROS see HPI     Objective:   Physical Exam  Vitals reviewed. Constitutional: He appears well-developed and well-nourished. No distress.  HENT:  Head: Normocephalic and atraumatic.  Nose: Mucosal edema and rhinorrhea present. Right sinus exhibits maxillary sinus tenderness and frontal sinus tenderness. Left sinus exhibits maxillary sinus tenderness and frontal sinus tenderness.  Mouth/Throat: Mucous membranes are normal. Oropharyngeal exudate and posterior oropharyngeal erythema present. No posterior oropharyngeal edema.       + PND TMs dull, erythematous, poor landmarks bilaterally  Eyes: Conjunctivae and EOM are normal. Pupils are equal, round, and reactive to light.  Neck: Normal range of motion. Neck supple.  Cardiovascular: Normal rate, regular rhythm and normal heart sounds.   Pulmonary/Chest: Effort normal and breath sounds normal. No respiratory distress. He has no wheezes.       + hacking cough  Lymphadenopathy:    He has no cervical adenopathy.  Skin: Skin is warm and dry.          Assessment & Plan:

## 2011-03-20 NOTE — Assessment & Plan Note (Signed)
Pt's sxs and PE consistent w/ infxn.  Also shows signs of early OM.  Start abx.  Reviewed supportive care and red flags that should prompt return.  Pt expressed understanding and is in agreement w/ plan.

## 2011-04-18 ENCOUNTER — Telehealth: Payer: Self-pay | Admitting: Internal Medicine

## 2011-04-18 NOTE — Telephone Encounter (Signed)
Refill: Potassium cl er 10 meq capsule. 30 day supply. (# 120). Last fill 12.28.12  Refill: Allopurinol 300 mg tablet. #30 Last fill 1.2.13

## 2011-04-19 MED ORDER — POTASSIUM CHLORIDE CRYS ER 20 MEQ PO TBCR: 20.0000 meq | EXTENDED_RELEASE_TABLET | Freq: Every day | ORAL | Status: DC

## 2011-04-19 MED ORDER — ALLOPURINOL 100 MG PO TABS: 50.0000 mg | ORAL_TABLET | Freq: Every day | ORAL | Status: DC

## 2011-04-19 NOTE — Telephone Encounter (Signed)
rx sent to pharmacy by e-script  

## 2011-04-30 ENCOUNTER — Telehealth: Payer: Self-pay | Admitting: Family Medicine

## 2011-04-30 NOTE — Telephone Encounter (Signed)
Refills for   1-triamterene-HCTZ 75-50MG  Tab Qty 30 Last date filled 2.15.13  2-Ramipril 5MG  Capsule  Qty 30 Last filled 2.15.13

## 2011-05-01 MED ORDER — RAMIPRIL 5 MG PO CAPS: 5.0000 mg | ORAL_CAPSULE | Freq: Every day | ORAL | Status: DC

## 2011-05-01 MED ORDER — TRIAMTERENE-HCTZ 75-50 MG PO TABS: 1.0000 | ORAL_TABLET | Freq: Every day | ORAL | Status: DC

## 2011-05-18 ENCOUNTER — Other Ambulatory Visit (HOSPITAL_BASED_OUTPATIENT_CLINIC_OR_DEPARTMENT_OTHER): Payer: Medicare Other | Admitting: Lab

## 2011-05-18 ENCOUNTER — Encounter: Payer: Self-pay | Admitting: Oncology

## 2011-05-18 ENCOUNTER — Other Ambulatory Visit: Payer: Self-pay

## 2011-05-18 ENCOUNTER — Telehealth: Payer: Self-pay | Admitting: Oncology

## 2011-05-18 ENCOUNTER — Ambulatory Visit (HOSPITAL_BASED_OUTPATIENT_CLINIC_OR_DEPARTMENT_OTHER): Payer: Medicare Other | Admitting: Oncology

## 2011-05-18 VITALS — BP 130/80 | HR 64 | Temp 96.7°F | Ht 70.7 in | Wt 331.2 lb

## 2011-05-18 DIAGNOSIS — E119 Type 2 diabetes mellitus without complications: Secondary | ICD-10-CM

## 2011-05-18 DIAGNOSIS — D869 Sarcoidosis, unspecified: Secondary | ICD-10-CM

## 2011-05-18 DIAGNOSIS — D751 Secondary polycythemia: Secondary | ICD-10-CM | POA: Insufficient documentation

## 2011-05-18 DIAGNOSIS — Z96659 Presence of unspecified artificial knee joint: Secondary | ICD-10-CM

## 2011-05-18 DIAGNOSIS — D582 Other hemoglobinopathies: Secondary | ICD-10-CM

## 2011-05-18 DIAGNOSIS — D750 Familial erythrocytosis: Secondary | ICD-10-CM

## 2011-05-18 LAB — COMPREHENSIVE METABOLIC PANEL
ALT: 15 U/L (ref 0–53)
AST: 20 U/L (ref 0–37)
Albumin: 3.7 g/dL (ref 3.5–5.2)
Alkaline Phosphatase: 63 U/L (ref 39–117)
BUN: 13 mg/dL (ref 6–23)
CO2: 26 mEq/L (ref 19–32)
Calcium: 9 mg/dL (ref 8.4–10.5)
Chloride: 102 mEq/L (ref 96–112)
Creatinine, Ser: 1.01 mg/dL (ref 0.50–1.35)
Glucose, Bld: 169 mg/dL — ABNORMAL HIGH (ref 70–99)
Potassium: 3.6 mEq/L (ref 3.5–5.3)
Sodium: 140 mEq/L (ref 135–145)
Total Bilirubin: 1 mg/dL (ref 0.3–1.2)
Total Protein: 5.7 g/dL — ABNORMAL LOW (ref 6.0–8.3)

## 2011-05-18 LAB — CBC WITH DIFFERENTIAL/PLATELET
BASO%: 0.8 % (ref 0.0–2.0)
Basophils Absolute: 0 10*3/uL (ref 0.0–0.1)
EOS%: 4.7 % (ref 0.0–7.0)
Eosinophils Absolute: 0.3 10*3/uL (ref 0.0–0.5)
HCT: 50.1 % — ABNORMAL HIGH (ref 38.4–49.9)
HGB: 17 g/dL (ref 13.0–17.1)
LYMPH%: 16.2 % (ref 14.0–49.0)
MCH: 31.7 pg (ref 27.2–33.4)
MCHC: 33.9 g/dL (ref 32.0–36.0)
MCV: 93.3 fL (ref 79.3–98.0)
MONO#: 0.5 10*3/uL (ref 0.1–0.9)
MONO%: 8.5 % (ref 0.0–14.0)
NEUT#: 4.1 10*3/uL (ref 1.5–6.5)
NEUT%: 69.8 % (ref 39.0–75.0)
Platelets: 152 10*3/uL (ref 140–400)
RBC: 5.37 10*6/uL (ref 4.20–5.82)
RDW: 14.3 % (ref 11.0–14.6)
WBC: 5.9 10*3/uL (ref 4.0–10.3)
lymph#: 1 10*3/uL (ref 0.9–3.3)
nRBC: 0 % (ref 0–0)

## 2011-05-18 LAB — LACTATE DEHYDROGENASE: LDH: 162 U/L (ref 94–250)

## 2011-05-18 NOTE — Telephone Encounter (Signed)
appts made and printed for pt ans pt will have cxr on Monday 4/8    aom

## 2011-05-18 NOTE — Progress Notes (Signed)
CC:   Neena Rhymes, M.D. Tonita Cong, M.D. Barbaraann Share, MD,FCCP  PROBLEM LIST: 1. 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 2. Hypertension. 3. Dyslipidemia. 4. Diabetes mellitus type 2. 5. 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 6. Morbid obesity. 7. Obstructive sleep apnea treated with CPAP for the past 10 years. 8. BPH. 9. 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 10.Bilateral tinnitus with onset approximately 2008. 11.Vertigo.  MEDICATIONS: 1. Allopurinol 50 mg daily. 2. Aspirin 81 mg daily. 3. Atenolol 50 mg daily. 4. Vitamin D 2000 units once daily Monday through Friday. 5. Ibuprofen 400 mg 3 times a day as needed. 6. Multivitamins 1 daily. 7. Actos 30 mg daily. 8. K-Dur 20 mEq daily. 9. Altace 5 mg daily. 10.Zocor 40 mg at bedtime. 11.Maxzide 75/50 mg 1/2 tablet daily.  HISTORY:  I am seeing Gary Butler today for followup of his erythrocytosis.  Gary Butler was last seen by [place] on 11/17/2010.  He was referred to me about a year ago for evaluation of his erythrocytosis prior to right total knee replacement that he underwent on 04/03/2010. Preliminary evaluation with JAK-2 mutation was negative.  It was my suspicion that Gary Butler might be having some desaturation at night.  He does have obstructive sleep apnea but did not sleep with oxygen.  O2 sat in the office was initially 96%.  Subsequently, Gary Butler underwent a nocturnal oximetry ordered by Dr. Shelle Iron on 11/23/2010.  There were 38 desaturation events of less than 3 minutes duration, during which the mean high was 95.8% and the mean low was 91.1%.  There was no time spent with a saturation level less than or equal to 88.  These results would therefore make it seem unlikely that Gary Butler's mild erythrocytosis is secondary to nocturnal desaturation.  He does not smoke.  Our feeling was that Gary Butler did not have polycythemia vera.  We decided to  follow him conservatively.  In looking over his labs, the highest hematocrit that we see over the past year is 50.1.  The highest hemoglobin was 17.4, which occurred back on 02/23/2010.  The lowest hemoglobin and hematocrit we see were 14.5 and 43.1 on 05/23/2010.  As stated, Gary Butler was last seen by [place] approximately 6 months ago.  There have been no medical problems during this time.  His main problem is pain in his knees, particularly his right knee where he had the knee replacement.  Gary Butler remains quite active with involvement in a number of volunteer capacities with a variety of organizations.  PHYSICAL EXAMINATION:  General:  Gary Butler looks well.  He is somewhat cherubic with facial plethora.  His weight is 331.2 pounds, which is generally stable for him.  Height is 5 feet 10.7 inches.  Body surface area 2.74 sq m.  Vital Signs:  Blood pressure 130/80.  Other vital signs are normal.  HEENT:  There is no scleral icterus.  Mouth and pharynx are benign.  Lymphatic:  There is no peripheral adenopathy palpable.  Heart and Lungs:  Normal.  Abdomen:  Massively obese.  I am unable to appreciate any organ enlargement, masses, etc.  Extremities:  He has 1+ to 2+ ankle edema.  Neurologic Exam:  Grossly normal. O2 sat was 97%.  LABORATORY DATA:  Today, white count 5.9, ANC 4.1, hemoglobin 17.0, hematocrit 50.1, platelets 152,000.  On 02/09/2011, hemoglobin was 16.6, hematocrit 48.8, and on 10/05, hemoglobin was 17.1, hematocrit 49.5. These values are basically  at the upper limits of normal or minimally increased.  Chemistries from 10/22/2010 notable for a potassium of 3.3, BUN 16, creatinine 0.80, glucose 148.  Uric acid back on 02/23/2010 was 8.1.  Albumin on 10/22/2010 3.7, LDH 164.  IMAGING STUDIES:  1. Chest x-ray, 2 view, from 06/14/2008 showed no acute cardiopulmonary disease with a stable appearance when compared with the chest x-ray of 01/17/2005.  2. X-ray of the right toe, 2 views,  on 10/22/2010 showed degenerative findings of the 1st metatarsophalangeal joint; otherwise, no significant abnormalities noted.  IMPRESSION AND PLAN:  Again, our clinical impression is that Gary Butler most likely has mild erythrocytosis that is either a normal variant or perhaps secondary to something other than the polycythemia vera.  In looking over his chart, we do not have a recent chest x-ray.  I am also trying to order an ultrasound to assess spleen size, which will be very difficult to assess in this gentleman who weighs 330 pounds.  Gary Butler was reassured.  It appears that he does not have nocturnal desaturation.  We will go ahead and order a chest x-ray for today.  I am trying to order an ultrasound of the abdomen to assess spleen size; however, EPIC will not allow me to do that for the diagnosis of  erythrocytosis.  We will check CBC in 6 months and plan to see Gary Butler again in 1 year, at which time we will check CBC and chemistries.    ______________________________ Samul Dada, M.D. DSM/MEDQ  D:  05/18/2011  T:  05/18/2011  Job:  [id number]

## 2011-05-18 NOTE — Progress Notes (Signed)
This office note has been dictated.  #[id number]

## 2011-05-22 ENCOUNTER — Other Ambulatory Visit: Payer: Self-pay | Admitting: Oncology

## 2011-05-22 DIAGNOSIS — R161 Splenomegaly, not elsewhere classified: Secondary | ICD-10-CM

## 2011-05-22 DIAGNOSIS — D739 Disease of spleen, unspecified: Secondary | ICD-10-CM

## 2011-05-22 DIAGNOSIS — D751 Secondary polycythemia: Secondary | ICD-10-CM

## 2011-05-30 ENCOUNTER — Ambulatory Visit: Payer: Medicare Other | Admitting: Pulmonary Disease

## 2011-06-01 ENCOUNTER — Telehealth: Payer: Self-pay | Admitting: Oncology

## 2011-06-01 NOTE — Telephone Encounter (Signed)
called pt with u/s appt  for 4/23  aom

## 2011-06-05 ENCOUNTER — Ambulatory Visit (HOSPITAL_COMMUNITY)
Admission: RE | Admit: 2011-06-05 | Discharge: 2011-06-05 | Disposition: A | Discharge status: Home / Self Care | Payer: Medicare Other | Source: Ambulatory Visit | Attending: Oncology | Admitting: Oncology

## 2011-06-05 ENCOUNTER — Other Ambulatory Visit (HOSPITAL_COMMUNITY): Payer: Medicare Other

## 2011-06-05 DIAGNOSIS — D751 Secondary polycythemia: Secondary | ICD-10-CM | POA: Insufficient documentation

## 2011-06-05 DIAGNOSIS — R161 Splenomegaly, not elsewhere classified: Secondary | ICD-10-CM | POA: Insufficient documentation

## 2011-06-07 ENCOUNTER — Ambulatory Visit (HOSPITAL_COMMUNITY)
Admission: RE | Admit: 2011-06-07 | Discharge: 2011-06-07 | Disposition: A | Discharge status: Home / Self Care | Payer: Medicare Other | Source: Ambulatory Visit | Attending: Oncology | Admitting: Oncology

## 2011-06-07 DIAGNOSIS — D45 Polycythemia vera: Secondary | ICD-10-CM | POA: Insufficient documentation

## 2011-06-07 DIAGNOSIS — E119 Type 2 diabetes mellitus without complications: Secondary | ICD-10-CM | POA: Insufficient documentation

## 2011-06-07 DIAGNOSIS — D869 Sarcoidosis, unspecified: Secondary | ICD-10-CM | POA: Insufficient documentation

## 2011-06-07 DIAGNOSIS — I10 Essential (primary) hypertension: Secondary | ICD-10-CM | POA: Insufficient documentation

## 2011-06-07 DIAGNOSIS — D751 Secondary polycythemia: Secondary | ICD-10-CM

## 2011-06-07 NOTE — Progress Notes (Signed)
Quick Note:  Please notify patient and call/fax these results to patient's doctors. ______ 

## 2011-06-08 ENCOUNTER — Encounter: Payer: Self-pay | Admitting: Pulmonary Disease

## 2011-06-08 ENCOUNTER — Ambulatory Visit (INDEPENDENT_AMBULATORY_CARE_PROVIDER_SITE_OTHER): Payer: Medicare Other | Admitting: Pulmonary Disease

## 2011-06-08 VITALS — BP 126/70 | HR 67 | Temp 97.4°F | Ht 72.0 in | Wt 340.8 lb

## 2011-06-08 DIAGNOSIS — G4733 Obstructive sleep apnea (adult) (pediatric): Secondary | ICD-10-CM

## 2011-06-08 NOTE — Progress Notes (Signed)
  Subjective:    Patient ID: Gary Butler, male    DOB: 1945-08-10, 66 y.o.   MRN: [id number]  HPI Patient comes in today for followup of his known obstructive sleep apnea.  He is wearing CPAP compliantly, is having no issues with his mask fit or pressure.  He feels that he sleeps well, and denies any alertness issues during the day.  He got a new CPAP machine last year, and we optimized his pressure to 15 cm.   Review of Systems  Constitutional: Negative for fever and unexpected weight change.  HENT: Positive for rhinorrhea, sneezing and postnasal drip. Negative for ear pain, nosebleeds, congestion, sore throat, trouble swallowing, dental problem and sinus pressure.   Eyes: Negative for redness and itching.  Respiratory: Negative for cough, chest tightness, shortness of breath and wheezing.   Cardiovascular: Positive for leg swelling. Negative for palpitations.  Gastrointestinal: Negative for nausea and vomiting.  Genitourinary: Negative for dysuria.  Musculoskeletal: Negative for joint swelling.  Skin: Negative for rash.  Neurological: Negative for headaches.  Hematological: Does not bruise/bleed easily.  Psychiatric/Behavioral: Negative for dysphoric mood. The patient is not nervous/anxious.        Objective:   Physical Exam Obese male in no acute distress No skin breakdown or pressure necrosis from the CPAP mask Lower extremities with mild edema, no cyanosis Alert and oriented, moves all 4 extremities.      Assessment & Plan:

## 2011-06-08 NOTE — Patient Instructions (Signed)
Continue with cpap, keep up with supplies and mask changes Work on weight loss followup with me in 1 year if doing well.

## 2011-06-08 NOTE — Assessment & Plan Note (Signed)
The patient is doing very well with CPAP, and feels that he sleeps well with adequate daytime alertness.  He just received a new machine last year, and has been keeping up with his supplies and mask changes.  I've encouraged him to work aggressively on weight loss, and to followup with me in one year.

## 2011-06-18 ENCOUNTER — Other Ambulatory Visit: Payer: Self-pay | Admitting: *Deleted

## 2011-06-18 ENCOUNTER — Telehealth: Payer: Self-pay

## 2011-06-18 MED ORDER — SIMVASTATIN 40 MG PO TABS: 40.0000 mg | ORAL_TABLET | Freq: Every day | ORAL | Status: DC

## 2011-06-18 NOTE — Telephone Encounter (Signed)
Rx sent 

## 2011-06-18 NOTE — Telephone Encounter (Signed)
Pt called at [phone] requesting cxr and splenic US results. LVM that they were essentially normal with mild splenomegaly on [place]

## 2011-06-19 ENCOUNTER — Other Ambulatory Visit: Payer: Self-pay

## 2011-06-19 ENCOUNTER — Telehealth: Payer: Self-pay

## 2011-06-19 DIAGNOSIS — D751 Secondary polycythemia: Secondary | ICD-10-CM

## 2011-06-19 NOTE — Telephone Encounter (Signed)
S/w pt that DSM wants him to have labs this week. Explained this was due to enlarged spleen on ultrasound and DSM wants to evaluate more for polycythemia vera. Pt expressed understanding and will call in AM for lab appt.

## 2011-06-20 ENCOUNTER — Ambulatory Visit (INDEPENDENT_AMBULATORY_CARE_PROVIDER_SITE_OTHER): Payer: Medicare Other | Admitting: Family Medicine

## 2011-06-20 ENCOUNTER — Telehealth: Payer: Self-pay | Admitting: Oncology

## 2011-06-20 ENCOUNTER — Encounter: Payer: Self-pay | Admitting: Family Medicine

## 2011-06-20 ENCOUNTER — Other Ambulatory Visit: Payer: Self-pay | Admitting: Nurse Practitioner

## 2011-06-20 VITALS — BP 120/84 | HR 69 | Temp 98.7°F | Ht 70.5 in | Wt 335.8 lb

## 2011-06-20 DIAGNOSIS — K5792 Diverticulitis of intestine, part unspecified, without perforation or abscess without bleeding: Secondary | ICD-10-CM

## 2011-06-20 DIAGNOSIS — D751 Secondary polycythemia: Secondary | ICD-10-CM

## 2011-06-20 DIAGNOSIS — D869 Sarcoidosis, unspecified: Secondary | ICD-10-CM

## 2011-06-20 DIAGNOSIS — K5732 Diverticulitis of large intestine without perforation or abscess without bleeding: Secondary | ICD-10-CM

## 2011-06-20 MED ORDER — CIPROFLOXACIN HCL 500 MG PO TABS: 500.0000 mg | ORAL_TABLET | Freq: Two times a day (BID) | ORAL | Status: AC

## 2011-06-20 MED ORDER — METRONIDAZOLE 500 MG PO TABS: 500.0000 mg | ORAL_TABLET | Freq: Three times a day (TID) | ORAL | Status: AC

## 2011-06-20 NOTE — Telephone Encounter (Signed)
pt aware of appt on 5/10   aom

## 2011-06-20 NOTE — Patient Instructions (Signed)
We'll notify you of your lab results Start the Cipro and Flagyl as directed for presumed diverticulitis If your symptoms change or worsen- call and/or go to ER Clear liquid diet until you are pain free Miralax as needed for constipation (in clear liquid) Hang in there!

## 2011-06-20 NOTE — Progress Notes (Signed)
  Subjective:    Patient ID: Gary Butler, male    DOB: 1945/12/14, 66 y.o.   MRN: [id number]  HPI sxs started Sunday night w/ 'terrible stomach cramps'.  Has not had BM in 3 days.  Vomiting 'white thick liquid' w/ 'a lot of gagging'.  Tm 101.  + cough- productive.  No known sick contacts but does a lot of hospital and hospice visits.  No facial pain/pressure.  Abdomen is tender to touch.   Review of Systems For ROS see HPI     Objective:   Physical Exam  Vitals reviewed. Constitutional: He appears well-developed and well-nourished. He appears distressed (obviously uncomfortable).  HENT:  Head: Normocephalic and atraumatic.  Nose: Nose normal.  Mouth/Throat: Oropharynx is clear and moist. No oropharyngeal exudate.       TMs normal bilaterally No TTP over sinuses  Neck: Normal range of motion. Neck supple.  Cardiovascular: Normal rate, regular rhythm, normal heart sounds and intact distal pulses.   Pulmonary/Chest: Effort normal and breath sounds normal. No respiratory distress. He has no wheezes. He has no rales.  Abdominal: Soft. Bowel sounds are normal. He exhibits no distension. There is tenderness (diffusely tender but more prominent in LLQ). There is no rebound and no guarding.          Assessment & Plan:

## 2011-06-21 LAB — CBC WITH DIFFERENTIAL/PLATELET
Basophils Absolute: 0 10*3/uL (ref 0.0–0.1)
Basophils Relative: 0.1 % (ref 0.0–3.0)
Eosinophils Absolute: 0.2 10*3/uL (ref 0.0–0.7)
Eosinophils Relative: 2.1 % (ref 0.0–5.0)
HCT: 50.8 % (ref 39.0–52.0)
Hemoglobin: 17.4 g/dL — ABNORMAL HIGH (ref 13.0–17.0)
Lymphocytes Relative: 18 % (ref 12.0–46.0)
Lymphs Abs: 1.6 10*3/uL (ref 0.7–4.0)
MCHC: 34.3 g/dL (ref 30.0–36.0)
MCV: 95.1 fl (ref 78.0–100.0)
Monocytes Absolute: 0.8 10*3/uL (ref 0.1–1.0)
Monocytes Relative: 9.7 % (ref 3.0–12.0)
Neutro Abs: 6 10*3/uL (ref 1.4–7.7)
Neutrophils Relative %: 70.1 % (ref 43.0–77.0)
Platelets: 176 10*3/uL (ref 150.0–400.0)
RBC: 5.34 Mil/uL (ref 4.22–5.81)
RDW: 14.3 % (ref 11.5–14.6)
WBC: 8.6 10*3/uL (ref 4.5–10.5)

## 2011-06-21 LAB — HEPATIC FUNCTION PANEL
ALT: 16 U/L (ref 0–53)
AST: 26 U/L (ref 0–37)
Albumin: 3.9 g/dL (ref 3.5–5.2)
Alkaline Phosphatase: 60 U/L (ref 39–117)
Bilirubin, Direct: 0.3 mg/dL (ref 0.0–0.3)
Total Bilirubin: 1.8 mg/dL — ABNORMAL HIGH (ref 0.3–1.2)
Total Protein: 6.9 g/dL (ref 6.0–8.3)

## 2011-06-21 LAB — BASIC METABOLIC PANEL
BUN: 12 mg/dL (ref 6–23)
CO2: 26 mEq/L (ref 19–32)
Calcium: 9.4 mg/dL (ref 8.4–10.5)
Chloride: 99 mEq/L (ref 96–112)
Creatinine, Ser: 0.9 mg/dL (ref 0.4–1.5)
GFR: 90.74 mL/min (ref >60.00)
Glucose, Bld: 101 mg/dL — ABNORMAL HIGH (ref 70–99)
Potassium: 4.1 mEq/L (ref 3.5–5.1)
Sodium: 140 mEq/L (ref 135–145)

## 2011-06-22 ENCOUNTER — Other Ambulatory Visit (HOSPITAL_BASED_OUTPATIENT_CLINIC_OR_DEPARTMENT_OTHER): Payer: Medicare Other | Admitting: Lab

## 2011-06-22 DIAGNOSIS — D751 Secondary polycythemia: Secondary | ICD-10-CM

## 2011-06-22 DIAGNOSIS — Z96659 Presence of unspecified artificial knee joint: Secondary | ICD-10-CM

## 2011-06-24 NOTE — Assessment & Plan Note (Signed)
New.  Given pain out of proportion to exam in LLQ suspect diverticulitis.  Start Cipro and Flagyl, clear diet.  Check labs.  Will hold on imaging at this time- pt told previously that he has diverticula.  Reviewed supportive care and red flags that should prompt return.  Pt expressed understanding and is in agreement w/ plan.

## 2011-06-25 LAB — CARBOXYHEMOGLOBIN: Carboxyhemoglobin: 3 %TOTAL HGB (ref <=12)

## 2011-06-25 LAB — ERYTHROPOIETIN: Erythropoietin: 14.1 m[IU]/mL (ref 2.6–34.0)

## 2011-06-27 ENCOUNTER — Telehealth: Payer: Self-pay | Admitting: Family Medicine

## 2011-06-27 NOTE — Telephone Encounter (Signed)
.  left message to have patient return my call.  

## 2011-06-27 NOTE — Telephone Encounter (Signed)
Caller: Lyn/Patient; PCP: Sheliah Hatch.; CB#: [phone];  Call regarding Diarrhea Taking Rx Cipro and Metronidazole;  Onset: 06/24/11.  Reports 2-3 episodes of explosive diarrhea qd resulting in incontinence.  Afebrile. On antibiotics for diverticulitis.  Call interrupted d/t emergency.  Called back; wife/Sue reports has pt left home; unable to triage symptoms.  States blood sugars are under control but actual value unknown.  OK to leave message on phone or can reach pt after 1400.  Requests RX be changed or stopped d/t diarrhea.  Info noted and sent to Dr Beverely Low for adult with questions about RX meds not covered by available resources per Med Question Call Guideline.  Please call back.

## 2011-06-27 NOTE — Telephone Encounter (Signed)
If pt's pain has improved he can stop all abx.  If he is still having pain will need imaging and GI referral.

## 2011-06-27 NOTE — Telephone Encounter (Signed)
Noted  

## 2011-06-27 NOTE — Telephone Encounter (Signed)
Caller: Cordel/Patient is calling with a question about Abx Question in EPIC, See Notes.The medication was written by Sheliah Hatch Advised of MD message below that if pain has improved, may stop all antiboitics; but if still has pain, will need imaging and GI referral.   Reports FBS 106 this morning.  Reports no more abdominal pain since 2 days after office visit.  MD message relayed r/t follow up to recent contact, info only, no triage required per Info Only Call Guideline.

## 2011-06-27 NOTE — Telephone Encounter (Signed)
[phone] 

## 2011-06-28 ENCOUNTER — Other Ambulatory Visit: Payer: Self-pay | Admitting: Family Medicine

## 2011-06-28 ENCOUNTER — Other Ambulatory Visit: Payer: Medicare Other | Admitting: Lab

## 2011-06-28 LAB — JAK2 EXONS 12-15

## 2011-06-28 MED ORDER — RAMIPRIL 5 MG PO CAPS: 5.0000 mg | ORAL_CAPSULE | Freq: Every day | ORAL | Status: DC

## 2011-06-28 NOTE — Telephone Encounter (Signed)
Refill Ramipril 5MG  Capsule  Qty 30 take one capsule by mouth once daily Last filled 4.20.13 Last ov 5.8.13

## 2011-06-28 NOTE — Telephone Encounter (Signed)
Rx sent 

## 2011-07-02 ENCOUNTER — Encounter: Payer: Self-pay | Admitting: Oncology

## 2011-07-02 NOTE — Progress Notes (Signed)
JAK2 exon 12 region mutation analysis was negative on 06/22/11.

## 2011-07-03 ENCOUNTER — Encounter: Payer: Self-pay | Admitting: Oncology

## 2011-07-03 LAB — OTHER SOLSTAS TEST

## 2011-07-03 NOTE — Progress Notes (Signed)
P50 on 06/28/2011 was 27.1 mmHg (22.0-28.0)  EPO level was 14.1 on 06/22/2011 and 18.4 on 02/23/2010, both in the normal range, i.e. not low.  JAK2 exon 12 mutation was not detected.  Hemoglobin 17.4 and hematocrit 50.8 on 06/20/2011.   I am inclined to be conservative.  I can't explain the ultrasound showing a slightly enlarged spleen, but everything else points to secondary mild erythrocytosis.  At some point we may want to repeat the abdominal US.  We will hold off on a bone marrow for now.

## 2011-07-09 ENCOUNTER — Encounter: Payer: Self-pay | Admitting: Oncology

## 2011-07-09 NOTE — Progress Notes (Unsigned)
Carboxyhemoglobin on 06/22/2011 was 3  (average smoker 4-5).

## 2011-07-17 ENCOUNTER — Emergency Department (HOSPITAL_BASED_OUTPATIENT_CLINIC_OR_DEPARTMENT_OTHER)
Admission: EM | Admit: 2011-07-17 | Discharge: 2011-07-17 | Disposition: A | Discharge status: Home / Self Care | Payer: Medicare Other | Attending: Emergency Medicine | Admitting: Emergency Medicine

## 2011-07-17 ENCOUNTER — Encounter (HOSPITAL_BASED_OUTPATIENT_CLINIC_OR_DEPARTMENT_OTHER): Payer: Self-pay | Admitting: *Deleted

## 2011-07-17 ENCOUNTER — Emergency Department (HOSPITAL_BASED_OUTPATIENT_CLINIC_OR_DEPARTMENT_OTHER): Payer: Medicare Other

## 2011-07-17 DIAGNOSIS — G4733 Obstructive sleep apnea (adult) (pediatric): Secondary | ICD-10-CM | POA: Insufficient documentation

## 2011-07-17 DIAGNOSIS — E119 Type 2 diabetes mellitus without complications: Secondary | ICD-10-CM | POA: Insufficient documentation

## 2011-07-17 DIAGNOSIS — R05 Cough: Secondary | ICD-10-CM | POA: Insufficient documentation

## 2011-07-17 DIAGNOSIS — R059 Cough, unspecified: Secondary | ICD-10-CM | POA: Insufficient documentation

## 2011-07-17 DIAGNOSIS — Z7982 Long term (current) use of aspirin: Secondary | ICD-10-CM | POA: Insufficient documentation

## 2011-07-17 DIAGNOSIS — Z79899 Other long term (current) drug therapy: Secondary | ICD-10-CM | POA: Insufficient documentation

## 2011-07-17 DIAGNOSIS — I10 Essential (primary) hypertension: Secondary | ICD-10-CM | POA: Insufficient documentation

## 2011-07-17 DIAGNOSIS — M129 Arthropathy, unspecified: Secondary | ICD-10-CM | POA: Insufficient documentation

## 2011-07-17 MED ORDER — AZITHROMYCIN 250 MG PO TABS: 250.0000 mg | ORAL_TABLET | Freq: Every day | ORAL | Status: AC

## 2011-07-17 MED ORDER — ALBUTEROL SULFATE (5 MG/ML) 0.5% IN NEBU
5.0000 mg | INHALATION_SOLUTION | Freq: Once | RESPIRATORY_TRACT | Status: AC
  Administered 2011-07-17: 5 mg via RESPIRATORY_TRACT
  Filled 2011-07-17: qty 1

## 2011-07-17 MED ORDER — HYDROCOD POLST-CHLORPHEN POLST 10-8 MG/5ML PO LQCR: 5.0000 mL | Freq: Two times a day (BID) | ORAL | Status: DC

## 2011-07-17 NOTE — ED Provider Notes (Signed)
History     CSN: [id number]  Arrival date & time 07/17/11  1312   First MD Initiated Contact with Patient 07/17/11 1323      Chief Complaint  Patient presents with  . Cough  . Nasal Congestion    (Consider location/radiation/quality/duration/timing/severity/associated sxs/prior treatment) Patient is a 66 y.o. male presenting with cough. The history is provided by the patient. No language interpreter was used.  Cough This is a new problem. The current episode started more than 1 week ago. The problem occurs constantly. The problem has not changed since onset.The cough is productive of sputum. There has been no fever. Associated symptoms include rhinorrhea. Pertinent negatives include no headaches. He is not a smoker. His past medical history is significant for pneumonia.    Past Medical History  Diagnosis Date  . Diabetes mellitus   . Hypertension   . Sarcoidosis   . OSA (obstructive sleep apnea)   . Arthritis   . Bronchitis, acute   . Heart murmur   . Sleep apnea     Past Surgical History  Procedure Date  . Addenoids   . Replacement total knee 2012  . Joint replacement   . Tonsillectomy     Family History  Problem Relation Age of Onset  . Emphysema Father   . Clotting disorder Father   . Arthritis Father   . Heart disease Mother   . Arthritis Mother   . Diabetes Maternal Aunt   . Diabetes Paternal Aunt   . Arthritis Maternal Grandmother   . Hypertension Maternal Grandmother   . Diabetes Maternal Grandmother   . Arthritis Paternal Grandmother     History  Substance Use Topics  . Smoking status: Never Smoker   . Smokeless tobacco: Not on file  . Alcohol Use: Yes     rare      Review of Systems  Constitutional: Negative for fever.  HENT: Positive for rhinorrhea.   Respiratory: Positive for cough.   Neurological: Negative for headaches.    Allergies  Lyrica; Penicillins; and Levofloxacin  Home Medications   Current Outpatient Rx  Name Route  Sig Dispense Refill  . ALLOPURINOL 100 MG PO TABS Oral Take 0.5 tablets (50 mg total) by mouth daily. 30 tablet 3  . ASPIRIN 81 MG PO TABS Oral Take 81 mg by mouth daily.     . ATENOLOL 50 MG PO TABS Oral Take 50 mg by mouth daily.      Marland Kitchen CETIRIZINE HCL 10 MG PO TABS Oral Take 10 mg by mouth daily.    Marland Kitchen VITAMIN D 2000 UNITS PO CAPS  Take 1 tab by mouth once daily Mondays through Fridays.     Marland Kitchen CLOBETASOL PROPIONATE 0.05 % EX OINT  as needed.     . IBUPROFEN 200 MG PO TABS Oral Take 400 mg by mouth 3 (three) times daily as needed. For pain      . ICAPS PO CAPS Oral Take 1 capsule by mouth daily.      Marland Kitchen NAPROXEN SODIUM 220 MG PO CAPS  as needed.      . OXYCODONE HCL PO Oral Take by mouth as needed.    Marland Kitchen PIOGLITAZONE HCL 30 MG PO TABS Oral Take 30 mg by mouth daily.      Marland Kitchen POTASSIUM CHLORIDE CRYS ER 20 MEQ PO TBCR Oral Take 1 tablet (20 mEq total) by mouth daily. 30 tablet 3  . RAMIPRIL 5 MG PO CAPS Oral Take 1 capsule (5 mg total) by  mouth daily. 30 capsule 3  . SIMVASTATIN 40 MG PO TABS Oral Take 1 tablet (40 mg total) by mouth at bedtime. 30 tablet 0  . SODIUM HYALURONATE (VISCOSUP) 20 MG/2ML IX SOLN  Injection into the knee as needed.     Marland Kitchen TRAMADOL HCL 50 MG PO TABS  as needed.     . TRIAMTERENE-HCTZ 75-50 MG PO TABS Oral Take 0.5 tablets by mouth daily.      BP 138/77  Pulse 65  Temp 97.7 F (36.5 C)  Resp 18  Ht 5\' 11"  (1.803 m)  Wt 322 lb (146.058 kg)  BMI 44.91 kg/m2  SpO2 96%  Physical Exam  Nursing note and vitals reviewed. Constitutional: He appears well-developed and well-nourished.  HENT:  Head: Normocephalic and atraumatic.  Right Ear: External ear normal.  Left Ear: External ear normal.  Nose: Rhinorrhea present.  Mouth/Throat: Posterior oropharyngeal erythema present.  Eyes: Conjunctivae and EOM are normal.  Neck: Normal range of motion. Neck supple.  Cardiovascular: Normal rate and regular rhythm.   Pulmonary/Chest: He has wheezes.  Musculoskeletal: Normal  range of motion.    ED Course  Procedures (including critical care time)  Labs Reviewed - No data to display Dg Chest 2 View  07/17/2011  *RADIOLOGY REPORT*  Clinical Data: Cough, congestion, history of sarcoidosis  CHEST - 2 VIEW  Comparison: 06/07/2011; 06/14/2020  Findings: Grossly unchanged borderline enlarged cardiac silhouette and mediastinal contours.  Lung volumes are persistently reduced. Bilateral infrahilar heterogeneous opacities, right greater than left.  No pleural effusion or pneumothorax.  Redemonstrated multilevel flowing osteophytosis with preservation of disc spaces suggestive of DISH.  IMPRESSION: Bilateral infrahilar heterogeneous opacities, right greater than left, similar to the 01/2005 examination, favored to represent subsegmental atelectasis.  Original Report Authenticated By: Waynard Reeds, M.D.     1. Cough       MDM  Pt feeling better after the treatment:will treat with antibiotics related to findings:pt has had biopsies related to sarcoidosis, but  Pt states that they agreed his scar tissue is related to second hand smoke        Teressa Lower, NP 07/17/11 1506

## 2011-07-17 NOTE — Discharge Instructions (Signed)
Cough, Adult  A cough is a reflex. It helps you clear your throat and airways. A cough can help heal your body. A cough can last 2 or 3 weeks (acute) or may last more than 8 weeks (chronic). Some common causes of a cough can include an infection, allergy, or a cold. HOME CARE  Only take medicine as told by your doctor.   If given, take your medicines (antibiotics) as told. Finish them even if you start to feel better.   Use a cold steam vaporizer or humidier in your home. This can help loosen thick spit (secretions).   Sleep so you are almost sitting up (semi-upright). Use pillows to do this. This helps reduce coughing.   Rest as needed.   Stop smoking if you smoke.  GET HELP RIGHT AWAY IF:  You have yellowish-white fluid (pus) in your thick spit.   Your cough gets worse.   Your medicine does not reduce coughing, and you are losing sleep.   You cough up blood.   You have trouble breathing.   Your pain gets worse and medicine does not help.   You have a fever.  MAKE SURE YOU:   Understand these instructions.   Will watch your condition.   Will get help right away if you are not doing well or get worse.  Document Released: 10/12/2010 Document Revised: 01/18/2011 Document Reviewed: 10/12/2010 [employer] Patient Information 2012 [employer]. 

## 2011-07-17 NOTE — ED Notes (Signed)
Pt c/o pro cough and congestion x 3 weeks

## 2011-07-18 ENCOUNTER — Other Ambulatory Visit: Payer: Self-pay | Admitting: Family Medicine

## 2011-07-18 ENCOUNTER — Ambulatory Visit (INDEPENDENT_AMBULATORY_CARE_PROVIDER_SITE_OTHER): Payer: Medicare Other | Admitting: Family Medicine

## 2011-07-18 ENCOUNTER — Encounter: Payer: Self-pay | Admitting: Family Medicine

## 2011-07-18 VITALS — BP 122/80 | HR 70 | Temp 98.6°F | Ht 71.0 in | Wt 332.4 lb

## 2011-07-18 DIAGNOSIS — J209 Acute bronchitis, unspecified: Secondary | ICD-10-CM

## 2011-07-18 MED ORDER — ALBUTEROL SULFATE HFA 108 (90 BASE) MCG/ACT IN AERS: 2.0000 | INHALATION_SPRAY | Freq: Four times a day (QID) | RESPIRATORY_TRACT | Status: DC | PRN

## 2011-07-18 MED ORDER — SIMVASTATIN 40 MG PO TABS: 40.0000 mg | ORAL_TABLET | Freq: Every day | ORAL | Status: DC

## 2011-07-18 NOTE — Progress Notes (Signed)
  Subjective:    Patient ID: Gary Butler, male    DOB: 04/28/1945, 66 y.o.   MRN: [id number]  HPI Cough- pt went to ER yesterday for cough, SOB.  sxs initially started 3 weeks ago- unable to come in b/c was on [place] cruise.  In ER had negative CXR.  Started on Zpack, Tussionex.  Pt reports hx of similar which has progressed to PNA which is why pt didn't wait for appt after developing SOB.  Pt reports albuterol tx was very helpful in stopping coughing fit yesterday.  Did not get inhaler at time of d/c- has never used inhaler.  Denies SOB today- which is what prompted the ER visit.  No fevers.  Today is feeling much improved- 'very little coughing today'.  No N/V/D.   Review of Systems For ROS see HPI     Objective:   Physical Exam  Vitals reviewed. Constitutional: He appears well-developed and well-nourished. No distress.  HENT:  Head: Normocephalic and atraumatic.  Right Ear: Tympanic membrane normal.  Left Ear: Tympanic membrane normal.  Nose: No mucosal edema or rhinorrhea. Right sinus exhibits no maxillary sinus tenderness and no frontal sinus tenderness. Left sinus exhibits no maxillary sinus tenderness and no frontal sinus tenderness.  Mouth/Throat: Mucous membranes are normal. No oropharyngeal exudate, posterior oropharyngeal edema or posterior oropharyngeal erythema.  Eyes: Conjunctivae and EOM are normal. Pupils are equal, round, and reactive to light.  Neck: Normal range of motion. Neck supple.  Cardiovascular: Normal rate, regular rhythm and normal heart sounds.   Pulmonary/Chest: Effort normal and breath sounds normal. No respiratory distress. He has no wheezes.       Dry cough  Lymphadenopathy:    He has no cervical adenopathy.  Skin: Skin is warm and dry.          Assessment & Plan:

## 2011-07-18 NOTE — Assessment & Plan Note (Signed)
New.  Reviewed ER records, CXR.  Agree w/ Zpack and cough syrup.  Add albuterol inhaler PRN for SOB/cough/wheezing.  Reviewed supportive care and red flags that should prompt return.  Pt expressed understanding and is in agreement w/ plan.

## 2011-07-18 NOTE — Telephone Encounter (Signed)
refill simvastatin 40mg  tablet Qty 30 Take one tablet by mouth at bedtime Last fill 5.6.13 Last ov 6.5.13

## 2011-07-18 NOTE — Patient Instructions (Signed)
Continue the Zpack as directed for the bronchitis Use the cough syrup as needed Use the inhaler- 2 puffs- every 4 hrs as needed for cough, shortness of breath, or wheezing Call with any questions or concerns Hang in there!!!

## 2011-07-18 NOTE — Telephone Encounter (Signed)
rx sent to pharmacy by e-script  

## 2011-07-23 NOTE — ED Provider Notes (Signed)
Medical screening examination/treatment/procedure(s) were performed by non-physician practitioner and as supervising physician I was immediately available for consultation/collaboration.    Ranferi Clingan L Janeva Peaster, MD 07/23/11 1134 

## 2011-08-13 ENCOUNTER — Telehealth: Payer: Self-pay | Admitting: Family Medicine

## 2011-08-13 MED ORDER — ALLOPURINOL 100 MG PO TABS: 50.0000 mg | ORAL_TABLET | Freq: Every day | ORAL | Status: DC

## 2011-08-13 NOTE — Telephone Encounter (Signed)
rx sent to pharmacy by e-script  

## 2011-08-13 NOTE — Telephone Encounter (Signed)
Refill: Allopurinol 300mg  tablet. Take 1 tablet by mouth once daily. Qty 30. Last fill 07-15-11

## 2011-08-23 MED ORDER — ALLOPURINOL 300 MG PO TABS: 300.0000 mg | ORAL_TABLET | Freq: Every day | ORAL | Status: DC

## 2011-08-23 NOTE — Addendum Note (Signed)
Addended by: Derry Lory A on: 08/23/2011 01:17 PM   Modules accepted: Orders

## 2011-08-23 NOTE — Telephone Encounter (Signed)
Please send 300mg 

## 2011-08-23 NOTE — Telephone Encounter (Signed)
Pt called back stating that he was previously taking 300 mg of the allopurinol but 100 mg was sent in.Please advise

## 2011-08-23 NOTE — Telephone Encounter (Signed)
Correct dosage for 300mg  sent to pharmacy via escribe, 100mg  has been deleted in chart due to new RX

## 2011-09-12 ENCOUNTER — Encounter: Payer: Self-pay | Admitting: Family Medicine

## 2011-09-12 ENCOUNTER — Ambulatory Visit (INDEPENDENT_AMBULATORY_CARE_PROVIDER_SITE_OTHER): Payer: Medicare Other | Admitting: Family Medicine

## 2011-09-12 VITALS — BP 128/80 | HR 67 | Temp 97.6°F | Ht 71.0 in | Wt 336.8 lb

## 2011-09-12 DIAGNOSIS — H00039 Abscess of eyelid unspecified eye, unspecified eyelid: Secondary | ICD-10-CM

## 2011-09-12 DIAGNOSIS — R52 Pain, unspecified: Secondary | ICD-10-CM

## 2011-09-12 MED ORDER — DOXYCYCLINE HYCLATE 100 MG PO CAPS: 100.0000 mg | ORAL_CAPSULE | Freq: Two times a day (BID) | ORAL | Status: AC

## 2011-09-12 MED ORDER — METHYLPREDNISOLONE ACETATE 80 MG/ML IJ SUSP
80.0000 mg | Freq: Once | INTRAMUSCULAR | Status: AC
  Administered 2011-09-12: 80 mg via INTRAMUSCULAR

## 2011-09-12 MED ORDER — PREDNISONE 20 MG PO TABS: ORAL_TABLET | ORAL | Status: DC

## 2011-09-12 NOTE — Patient Instructions (Addendum)
Start the prednisone tomorrow morning- 2 tabs at the same time w/ food Start the Doxy tonight w/ dinner Cool compress to the eyes If worsening pain or redness- call me! Hang in there!!!

## 2011-09-12 NOTE — Progress Notes (Signed)
  Subjective:    Patient ID: Gary Butler, male    DOB: 01/08/1946, 66 y.o.   MRN: [id number]  HPI Eye infxn- sxs started as a scalp lesion 1 week ago that was very itchy.  Now red and bumpy.  Eyes became red and swollen 3 days ago.  + drainage- thin and watery primarily from R eye.  Nasal bridge, both eyes and forehead are swollen.  No fevers.  Eyes are painful to touch.  No visual changes.     Review of Systems For ROS see HPI     Objective:   Physical Exam  Vitals reviewed. Constitutional: He appears well-developed and well-nourished. No distress.  Eyes: Conjunctivae and EOM are normal. Pupils are equal, round, and reactive to light. Right eye exhibits no discharge. Left eye exhibits no discharge.       R eyelid markedly erythematous and swollen, TTP Globe itself is not tender Vision intact w/ exception of overhanging upper lid  Skin: Skin is warm and dry. There is erythema (erythema and swelling of R scalp at the hairline, R forehead and nasal bridge).          Assessment & Plan:

## 2011-09-25 ENCOUNTER — Telehealth: Payer: Self-pay | Admitting: *Deleted

## 2011-09-25 DIAGNOSIS — H00039 Abscess of eyelid unspecified eye, unspecified eyelid: Secondary | ICD-10-CM | POA: Insufficient documentation

## 2011-09-25 NOTE — Assessment & Plan Note (Signed)
New.  Eyes themselves are clear and unaffected but lids, forehead, and scalp are inflammed, tender, and swollen.  Start Doxy to cover for possible MRSA and prednisone to decrease the marked edema.  Reviewed supportive care and red flags that should prompt return.  Pt expressed understanding and is in agreement w/ plan.

## 2011-09-25 NOTE — Telephone Encounter (Signed)
Outgoing call to clarify his current sxs, pt declined his eye is worse, however notes this is better but does want to review the scalp that started prior to the eye incident, pt noted he is experiencing back pain and will need ortho referral as well per recent right knee replacement did not work well and wants a second opinion, advised pt we will see him during his apt, pt understood and MD Beverely Low made aware verbally

## 2011-09-26 ENCOUNTER — Ambulatory Visit (INDEPENDENT_AMBULATORY_CARE_PROVIDER_SITE_OTHER): Payer: Medicare Other | Admitting: Family Medicine

## 2011-09-26 ENCOUNTER — Encounter: Payer: Self-pay | Admitting: Family Medicine

## 2011-09-26 VITALS — BP 121/76 | HR 65 | Temp 98.2°F | Ht 69.5 in | Wt 331.4 lb

## 2011-09-26 DIAGNOSIS — Z96659 Presence of unspecified artificial knee joint: Secondary | ICD-10-CM

## 2011-09-26 NOTE — Progress Notes (Signed)
  Subjective:    Patient ID: Gary Butler, male    DOB: [birth year], 66 y.o.   MRN: [id number]  HPI Knee pain- s/p R knee replacement w/ Dr Dimas Aguas.  Pain has never stopped and swelling has never improved even though surgery was 18+ months ago.  Also needs L knee replaced but is not willing to proceed w/ additional surgery until he knows what's going on w/ R.  [facility].  Now is fearful of falling.   Review of Systems For ROS see HPI     Objective:   Physical Exam  Vitals reviewed. Constitutional: He is oriented to person, place, and time. He appears well-developed and well-nourished. No distress.       Morbidly obese  Neurological: He is alert and oriented to person, place, and time.  Psychiatric: He has a normal mood and affect. His behavior is normal.          Assessment & Plan:

## 2011-09-26 NOTE — Assessment & Plan Note (Signed)
Pain and swelling from R TKR have not improved in the 18 months since surgery.  Would like 2nd opinion on R knee and L knee assessed prior to surgery.  Referral made.  Will follow.

## 2011-09-26 NOTE — Patient Instructions (Addendum)
We'll call you with your ortho appt Call with any questions or concerns Hang in there!!!

## 2011-10-05 ENCOUNTER — Other Ambulatory Visit: Payer: Self-pay | Admitting: Family Medicine

## 2011-10-05 MED ORDER — POTASSIUM CHLORIDE CRYS ER 20 MEQ PO TBCR: 20.0000 meq | EXTENDED_RELEASE_TABLET | Freq: Every day | ORAL | Status: DC

## 2011-10-05 NOTE — Telephone Encounter (Signed)
Refill K-DUR,KLOR-CON 20 MEQ Take 1 tablet (20 mEq total) by mouth daily.#30-wt3-refills Last fill 7.31.13 Last ov 8.14.13 surgical f/u

## 2011-10-05 NOTE — Telephone Encounter (Signed)
rx sent to pharmacy by e-script  

## 2011-10-22 ENCOUNTER — Other Ambulatory Visit: Payer: Self-pay | Admitting: Family Medicine

## 2011-10-22 MED ORDER — RAMIPRIL 5 MG PO CAPS: 5.0000 mg | ORAL_CAPSULE | Freq: Every day | ORAL | Status: DC

## 2011-10-22 NOTE — Telephone Encounter (Signed)
Refill Ramipril (Cap) 5 MG Take 1 capsule (5 mg total) by mouth daily #30 wt/3-refills  Last ov 8.14.13 f/u

## 2011-10-22 NOTE — Telephone Encounter (Signed)
rx sent to pharmacy by e-script  

## 2011-11-12 ENCOUNTER — Other Ambulatory Visit: Payer: Self-pay | Admitting: Family Medicine

## 2011-11-12 NOTE — Telephone Encounter (Signed)
Ok for #30, 3 refills.  Please ask him to schedule CPE.  Also, endo is likely checking cholesterol but I need a copy of these labs if I'm going to fill his meds.

## 2011-11-12 NOTE — Telephone Encounter (Signed)
Pt was advised on 12-22-10 to schedule CPE asap, noted pt established that day and has been seen for acute OV since that OV, please advised if follow up needed prior to CPE per far out schedule per request for zocor and unable to locate indicating labs

## 2011-11-12 NOTE — Telephone Encounter (Signed)
refill Simvastatin (Tab) 40 MG Take 1 tablet (40 mg total) by mouth at bedtime # 30 wt/3-refills --last ov 8.14.13, last fill 8.31.13

## 2011-11-13 MED ORDER — SIMVASTATIN 40 MG PO TABS: 40.0000 mg | ORAL_TABLET | Freq: Every day | ORAL | Status: DC

## 2011-11-13 NOTE — Telephone Encounter (Signed)
rx sent to pharmacy by e-script, pt given fax number for the endo office to fax recent labs to MD Tabori, pt will call back to office to schedule his CPE, advised that he is still waiting on a call back from [facility] office for the apt concerning his total knee replacement, advised that someone from this office will call him concerning the apt, noted in chart several calls have been made from this office to MD Charlann Boxer and last notation advised that they will contact our office about any openings per MD Charlann Boxer is to open up schedule, MD Beverely Low made aware verbally

## 2011-11-15 ENCOUNTER — Other Ambulatory Visit: Payer: Medicare Other | Admitting: Lab

## 2011-11-27 ENCOUNTER — Other Ambulatory Visit (HOSPITAL_COMMUNITY): Payer: Self-pay | Admitting: Orthopedic Surgery

## 2011-11-27 DIAGNOSIS — M25561 Pain in right knee: Secondary | ICD-10-CM

## 2011-12-10 ENCOUNTER — Other Ambulatory Visit: Payer: Self-pay | Admitting: Family Medicine

## 2011-12-10 ENCOUNTER — Encounter (HOSPITAL_COMMUNITY)
Admission: RE | Admit: 2011-12-10 | Discharge: 2011-12-10 | Disposition: A | Discharge status: Home / Self Care | Payer: Medicare Other | Source: Ambulatory Visit | Attending: Orthopedic Surgery | Admitting: Orthopedic Surgery

## 2011-12-10 DIAGNOSIS — M25569 Pain in unspecified knee: Secondary | ICD-10-CM | POA: Insufficient documentation

## 2011-12-10 DIAGNOSIS — M25561 Pain in right knee: Secondary | ICD-10-CM

## 2011-12-10 DIAGNOSIS — Z96659 Presence of unspecified artificial knee joint: Secondary | ICD-10-CM | POA: Insufficient documentation

## 2011-12-10 MED ORDER — TECHNETIUM TC 99M MEDRONATE IV KIT
25.0000 | PACK | Freq: Once | INTRAVENOUS | Status: AC | PRN
  Administered 2011-12-10: 25 via INTRAVENOUS

## 2011-12-10 MED ORDER — TRIAMTERENE-HCTZ 75-50 MG PO TABS: 0.5000 | ORAL_TABLET | Freq: Every day | ORAL | Status: DC

## 2011-12-10 NOTE — Telephone Encounter (Signed)
Refill done.  

## 2011-12-10 NOTE — Telephone Encounter (Signed)
REFILL Triamterene-HCTZ (Tab) 75-50 MG Take 0.5 tablets by mouth daily #30 WT/1-REFILL LAST fill 09.18.13 last ov 8.14.13 follow up

## 2012-01-02 ENCOUNTER — Ambulatory Visit (INDEPENDENT_AMBULATORY_CARE_PROVIDER_SITE_OTHER): Payer: Medicare Other | Admitting: Family Medicine

## 2012-01-02 ENCOUNTER — Encounter: Payer: Self-pay | Admitting: Family Medicine

## 2012-01-02 VITALS — BP 130/68 | HR 72 | Temp 98.1°F | Wt 339.0 lb

## 2012-01-02 DIAGNOSIS — J209 Acute bronchitis, unspecified: Secondary | ICD-10-CM

## 2012-01-02 MED ORDER — AZITHROMYCIN 250 MG PO TABS: ORAL_TABLET | ORAL | Status: DC

## 2012-01-02 MED ORDER — GUAIFENESIN-CODEINE 100-10 MG/5ML PO SYRP: 10.0000 mL | ORAL_SOLUTION | Freq: Three times a day (TID) | ORAL | Status: DC | PRN

## 2012-01-02 NOTE — Progress Notes (Signed)
  Subjective:    Patient ID: Gary Butler, male    DOB: 02-Apr-1945, 66 y.o.   MRN: [id number]  HPI Cough- sxs started 2 weeks ago.  Initially thought it was allergies.  2 days ago felt that it was progressing to chest.  Denies HA, facial pain/pressure.  + chills.  + nasal congestion, sneezing, frequent tearing.  Cough is productive in the AM but otherwise dry.  No fevers.  + sick contacts.   Review of Systems For ROS see HPI     Objective:   Physical Exam  Vitals reviewed. Constitutional: He appears well-developed and well-nourished. No distress.  HENT:  Head: Normocephalic and atraumatic.  Right Ear: Tympanic membrane normal.  Left Ear: Tympanic membrane normal.  Nose: No mucosal edema or rhinorrhea. Right sinus exhibits no maxillary sinus tenderness and no frontal sinus tenderness. Left sinus exhibits no maxillary sinus tenderness and no frontal sinus tenderness.  Mouth/Throat: Mucous membranes are normal. No oropharyngeal exudate, posterior oropharyngeal edema or posterior oropharyngeal erythema.  Eyes: Conjunctivae normal and EOM are normal. Pupils are equal, round, and reactive to light.  Neck: Normal range of motion. Neck supple.  Cardiovascular: Normal rate, regular rhythm and normal heart sounds.   Pulmonary/Chest: Effort normal and breath sounds normal. No respiratory distress. He has no wheezes.       + hacking cough  Lymphadenopathy:    He has no cervical adenopathy.  Skin: Skin is warm and dry.          Assessment & Plan:

## 2012-01-02 NOTE — Patient Instructions (Addendum)
This is bronchitis Start the Zpack as directed Drink plenty of fluids Cough syrup as needed- this may make you drowsy Call with any questions or concerns Happy Holidays!!!

## 2012-01-03 NOTE — Assessment & Plan Note (Signed)
New.  Start abx due to upcoming holiday and current sxs.  Reviewed supportive care and red flags that should prompt return.  Pt expressed understanding and is in agreement w/ plan.

## 2012-01-24 ENCOUNTER — Ambulatory Visit: Payer: Medicare Other | Admitting: Family Medicine

## 2012-01-28 ENCOUNTER — Encounter: Payer: Self-pay | Admitting: Family Medicine

## 2012-01-28 ENCOUNTER — Ambulatory Visit (INDEPENDENT_AMBULATORY_CARE_PROVIDER_SITE_OTHER): Payer: Medicare Other | Admitting: Family Medicine

## 2012-01-28 VITALS — BP 100/68 | HR 68 | Temp 97.7°F | Wt 340.0 lb

## 2012-01-28 DIAGNOSIS — Z0000 Encounter for general adult medical examination without abnormal findings: Secondary | ICD-10-CM

## 2012-01-28 DIAGNOSIS — I10 Essential (primary) hypertension: Secondary | ICD-10-CM

## 2012-01-28 LAB — CBC WITH DIFFERENTIAL/PLATELET
Basophils Absolute: 0 10*3/uL (ref 0.0–0.1)
Basophils Relative: 0.2 % (ref 0.0–3.0)
Eosinophils Absolute: 0 10*3/uL (ref 0.0–0.7)
Eosinophils Relative: 0.4 % (ref 0.0–5.0)
HCT: 53.7 % — ABNORMAL HIGH (ref 39.0–52.0)
Hemoglobin: 17.8 g/dL — ABNORMAL HIGH (ref 13.0–17.0)
Lymphocytes Relative: 8.2 % — ABNORMAL LOW (ref 12.0–46.0)
Lymphs Abs: 0.9 10*3/uL (ref 0.7–4.0)
MCHC: 33.1 g/dL (ref 30.0–36.0)
MCV: 95.4 fl (ref 78.0–100.0)
Monocytes Absolute: 0.6 10*3/uL (ref 0.1–1.0)
Monocytes Relative: 5.5 % (ref 3.0–12.0)
Neutro Abs: 8.9 10*3/uL — ABNORMAL HIGH (ref 1.4–7.7)
Neutrophils Relative %: 85.7 % — ABNORMAL HIGH (ref 43.0–77.0)
Platelets: 192 10*3/uL (ref 150.0–400.0)
RBC: 5.63 Mil/uL (ref 4.22–5.81)
RDW: 14.4 % (ref 11.5–14.6)
WBC: 10.4 10*3/uL (ref 4.5–10.5)

## 2012-01-28 LAB — BASIC METABOLIC PANEL
BUN: 16 mg/dL (ref 6–23)
CO2: 27 mEq/L (ref 19–32)
Calcium: 9.4 mg/dL (ref 8.4–10.5)
Chloride: 97 mEq/L (ref 96–112)
Creatinine, Ser: 1 mg/dL (ref 0.4–1.5)
GFR: 75.67 mL/min (ref >60.00)
Glucose, Bld: 195 mg/dL — ABNORMAL HIGH (ref 70–99)
Potassium: 3.6 mEq/L (ref 3.5–5.1)
Sodium: 136 mEq/L (ref 135–145)

## 2012-01-28 NOTE — Progress Notes (Signed)
  Subjective:    Patient ID: Gary Butler, male    DOB: 20-Jul-1945, 66 y.o.   MRN: [id number]  HPI Here today for CPE.  Risk Factors: HTN- chronic problem, on Maxzide, Altace, atenolol.  Denies CP, SOB, HAs, visual changes, edema. DM- following w/ Endo regularly Hyperlipidemia- chronic problem, on Zocor daily.  No abd pain, N/V, myalgias.  Had labs done in Aug and lipids were at goal Physical Activity: limited due to severe OA Fall Risk: low risk Depression: denies current sxs Hearing: wearing hearing aides bilaterally ADL's: independent Cognitive: normal linear thought process, memory and attention intact Home Safety: safe at home, lives w/ wife Height, Weight, BMI, Visual Acuity: see vitals, vision corrected to 20/20 w/ glasses Counseling: colonoscopy UTD w/ Dr Kinnie Scales, PSA w/ urology Labs Ordered: See A&P Care Plan: See A&P   Review of Systems Patient reports no vision/hearing changes, anorexia, fever ,adenopathy, persistant/recurrent hoarseness, swallowing issues, chest pain, palpitations, edema, persistant/recurrent cough, hemoptysis, dyspnea (rest,exertional, paroxysmal nocturnal), gastrointestinal  bleeding (melena, rectal bleeding), abdominal pain, excessive heart burn, GU symptoms (dysuria, hematuria, voiding/incontinence issues) syncope, focal weakness, memory loss, numbness & tingling, skin/hair/nail changes, depression, anxiety, abnormal bruising/bleeding, musculoskeletal symptoms/signs.     Objective:   Physical Exam BP 100/68  Pulse 68  Temp 97.7 F (36.5 C) (Oral)  Wt 340 lb (154.223 kg)  SpO2 95%  General Appearance:    Alert, cooperative, no distress, appears stated age  Head:    Normocephalic, without obvious abnormality, atraumatic  Eyes:    PERRL, conjunctiva/corneas clear, EOM's intact, fundi    benign, both eyes       Ears:    Normal TM's and external ear canals, both ears  Nose:   Nares normal, septum midline, mucosa normal, no drainage   or sinus tenderness   Throat:   Lips, mucosa, and tongue normal; teeth and gums normal  Neck:   Supple, symmetrical, trachea midline, no adenopathy;       thyroid:  No enlargement/tenderness/nodules  Back:     Symmetric, no curvature, ROM normal, no CVA tenderness  Lungs:     Clear to auscultation bilaterally, respirations unlabored  Chest wall:    No tenderness or deformity  Heart:    Regular rate and rhythm, S1 and S2 normal, no murmur, rub   or gallop  Abdomen:     Soft, non-tender, bowel sounds active all four quadrants,    no masses, no organomegaly.  obese  Genitalia:    Deferred to urology  Rectal:    Extremities:   Extremities normal, atraumatic, no cyanosis or edema  Pulses:   2+ and symmetric all extremities  Skin:   Skin color, texture, turgor normal, no rashes or lesions  Lymph nodes:   Cervical, supraclavicular, and axillary nodes normal  Neurologic:   CNII-XII intact. Normal strength, sensation and reflexes      throughout          Assessment & Plan:

## 2012-01-28 NOTE — Patient Instructions (Addendum)
Follow up in 6 months- sooner if needed We'll notify you of your lab results and make any changes if needed Keep up the good work! Call with any questions or concerns Altamese Cabal Christmas!!!

## 2012-01-29 NOTE — Assessment & Plan Note (Signed)
Pt's PE WNL w/ exception of obesity.  UTD on health maintenance.  Has had labs done in August- will not repeat.  Check remaining labs.  Anticipatory guidance provided.

## 2012-01-29 NOTE — Assessment & Plan Note (Signed)
Chronic problem.  Well controlled on current meds.  Asymptomatic.  No changes. 

## 2012-02-04 ENCOUNTER — Other Ambulatory Visit: Payer: Self-pay | Admitting: Family Medicine

## 2012-02-04 MED ORDER — TRIAMTERENE-HCTZ 75-50 MG PO TABS: 0.5000 | ORAL_TABLET | Freq: Every day | ORAL | Status: DC

## 2012-02-04 NOTE — Telephone Encounter (Signed)
refill Triamterene-HCTZ (Tab) 75-50 MG Take 0.5 tablets by mouth daily. #30 wt/1-refill last fill 9.18.13

## 2012-02-04 NOTE — Telephone Encounter (Signed)
Rx was sent to pharmacy by e-script.//AB/CMA 

## 2012-02-08 ENCOUNTER — Telehealth: Payer: Self-pay | Admitting: Family Medicine

## 2012-02-08 ENCOUNTER — Telehealth: Payer: Self-pay | Admitting: *Deleted

## 2012-02-08 DIAGNOSIS — M109 Gout, unspecified: Secondary | ICD-10-CM

## 2012-02-08 DIAGNOSIS — I10 Essential (primary) hypertension: Secondary | ICD-10-CM

## 2012-02-08 MED ORDER — RAMIPRIL 5 MG PO CAPS: 5.0000 mg | ORAL_CAPSULE | Freq: Every day | ORAL | Status: DC

## 2012-02-08 MED ORDER — ALLOPURINOL 300 MG PO TABS: 300.0000 mg | ORAL_TABLET | Freq: Every day | ORAL | Status: DC

## 2012-02-08 NOTE — Telephone Encounter (Signed)
Refill: ramipril 5 mg capsule. Take 1 capsule by mouth once daily. Qty 30. Last fill 01-12-12

## 2012-02-08 NOTE — Telephone Encounter (Signed)
Message copied by Verdene Rio on Fri Feb 08, 2012  5:59 PM ------      Message from: Sheliah Hatch      Created: Mon Jan 28, 2012  5:01 PM       Hemoglobin is stable but his neutrophils are elevated and lymphocytes are slightly low.  Please have him repeat CBC in 1 week (dx abnormal WBC), lab only visit      BMP looks good w/ exception of elevated sugar

## 2012-02-08 NOTE — Telephone Encounter (Signed)
Refill: Allopurinol 300 mg tablet. Take 1 tablet by mouth once daily. Qty 30. Last fill 01-12-12

## 2012-02-08 NOTE — Telephone Encounter (Signed)
Refills for allopurinol and ramipril sent to [facility]

## 2012-02-08 NOTE — Telephone Encounter (Signed)
Please advise on RF request.//AB/CMA 

## 2012-02-08 NOTE — Telephone Encounter (Signed)
Ok for #30, 6 refills 

## 2012-02-08 NOTE — Telephone Encounter (Signed)
Left message to call office

## 2012-02-12 NOTE — Telephone Encounter (Signed)
Pt states that he spoke with someone on yesterday and already has appt scheduled. Per last access Pt was made aware by Dois Davenport.

## 2012-02-15 ENCOUNTER — Other Ambulatory Visit (INDEPENDENT_AMBULATORY_CARE_PROVIDER_SITE_OTHER): Payer: Medicare Other

## 2012-02-15 DIAGNOSIS — D72819 Decreased white blood cell count, unspecified: Secondary | ICD-10-CM

## 2012-02-15 LAB — CBC WITH DIFFERENTIAL/PLATELET
Basophils Absolute: 0 10*3/uL (ref 0.0–0.1)
Basophils Relative: 0.4 % (ref 0.0–3.0)
Eosinophils Absolute: 0.3 10*3/uL (ref 0.0–0.7)
Eosinophils Relative: 5.3 % — ABNORMAL HIGH (ref 0.0–5.0)
HCT: 48.3 % (ref 39.0–52.0)
Hemoglobin: 16.8 g/dL (ref 13.0–17.0)
Lymphocytes Relative: 15.4 % (ref 12.0–46.0)
Lymphs Abs: 0.9 10*3/uL (ref 0.7–4.0)
MCHC: 34.7 g/dL (ref 30.0–36.0)
MCV: 93.2 fl (ref 78.0–100.0)
Monocytes Absolute: 0.4 10*3/uL (ref 0.1–1.0)
Monocytes Relative: 7.7 % (ref 3.0–12.0)
Neutro Abs: 4 10*3/uL (ref 1.4–7.7)
Neutrophils Relative %: 71.2 % (ref 43.0–77.0)
Platelets: 145 10*3/uL — ABNORMAL LOW (ref 150.0–400.0)
RBC: 5.18 Mil/uL (ref 4.22–5.81)
RDW: 14.5 % (ref 11.5–14.6)
WBC: 5.6 10*3/uL (ref 4.5–10.5)

## 2012-02-18 ENCOUNTER — Encounter: Payer: Self-pay | Admitting: *Deleted

## 2012-03-06 ENCOUNTER — Telehealth: Payer: Self-pay | Admitting: Family Medicine

## 2012-03-06 MED ORDER — SIMVASTATIN 40 MG PO TABS: 40.0000 mg | ORAL_TABLET | Freq: Every day | ORAL | Status: DC

## 2012-03-06 NOTE — Telephone Encounter (Signed)
Refill: Simvastatin 40mg  tablet. Take 1 tablet by mouth at bedtime. Qty 30. Last fill 02-09-12

## 2012-03-06 NOTE — Telephone Encounter (Signed)
Had labs done at [facility], ok for #90, 3 refills

## 2012-03-06 NOTE — Telephone Encounter (Signed)
Rx sent 

## 2012-03-06 NOTE — Telephone Encounter (Signed)
Last OV 01-28-12, last filled 11-12-11 #30 3, no history of cholesterol panel done. .Please advise

## 2012-04-24 ENCOUNTER — Telehealth: Payer: Self-pay | Admitting: Oncology

## 2012-04-24 NOTE — Telephone Encounter (Signed)
LVM FOR PT NEW APPT...MAILED PT APT SCHEDULE FOR aPRIL

## 2012-05-06 ENCOUNTER — Telehealth: Payer: Self-pay | Admitting: Family Medicine

## 2012-05-06 MED ORDER — POTASSIUM CHLORIDE CRYS ER 20 MEQ PO TBCR: 20.0000 meq | EXTENDED_RELEASE_TABLET | Freq: Every day | ORAL | Status: DC

## 2012-05-06 NOTE — Telephone Encounter (Signed)
Refill: Klor-con 20 meq. Take 1 tablet by mouth once daily. Qty 30. Last fill 04-12-12

## 2012-05-06 NOTE — Telephone Encounter (Signed)
Refill done.  

## 2012-05-14 ENCOUNTER — Other Ambulatory Visit (HOSPITAL_BASED_OUTPATIENT_CLINIC_OR_DEPARTMENT_OTHER): Payer: 59 | Admitting: Lab

## 2012-05-14 ENCOUNTER — Telehealth: Payer: Self-pay | Admitting: Oncology

## 2012-05-14 ENCOUNTER — Telehealth: Payer: Self-pay | Admitting: Medical Oncology

## 2012-05-14 ENCOUNTER — Ambulatory Visit (HOSPITAL_BASED_OUTPATIENT_CLINIC_OR_DEPARTMENT_OTHER): Payer: 59 | Admitting: Physician Assistant

## 2012-05-14 VITALS — BP 119/71 | HR 63 | Temp 97.0°F | Resp 22 | Ht 69.5 in | Wt 341.0 lb

## 2012-05-14 DIAGNOSIS — L4050 Arthropathic psoriasis, unspecified: Secondary | ICD-10-CM | POA: Insufficient documentation

## 2012-05-14 DIAGNOSIS — D751 Secondary polycythemia: Secondary | ICD-10-CM

## 2012-05-14 LAB — CBC WITH DIFFERENTIAL/PLATELET
BASO%: 0.6 % (ref 0.0–2.0)
Basophils Absolute: 0 10*3/uL (ref 0.0–0.1)
EOS%: 4.8 % (ref 0.0–7.0)
Eosinophils Absolute: 0.4 10*3/uL (ref 0.0–0.5)
HCT: 47.3 % (ref 38.4–49.9)
HGB: 16.1 g/dL (ref 13.0–17.1)
LYMPH%: 13.5 % — ABNORMAL LOW (ref 14.0–49.0)
MCH: 31.9 pg (ref 27.2–33.4)
MCHC: 34.1 g/dL (ref 32.0–36.0)
MCV: 93.6 fL (ref 79.3–98.0)
MONO#: 0.5 10*3/uL (ref 0.1–0.9)
MONO%: 6.4 % (ref 0.0–14.0)
NEUT#: 5.6 10*3/uL (ref 1.5–6.5)
NEUT%: 74.7 % (ref 39.0–75.0)
Platelets: 154 10*3/uL (ref 140–400)
RBC: 5.06 10*6/uL (ref 4.20–5.82)
RDW: 14.5 % (ref 11.0–14.6)
WBC: 7.5 10*3/uL (ref 4.0–10.3)
lymph#: 1 10*3/uL (ref 0.9–3.3)

## 2012-05-14 LAB — COMPREHENSIVE METABOLIC PANEL (CC13)
ALT: 21 U/L (ref 0–55)
AST: 20 U/L (ref 5–34)
Albumin: 3.2 g/dL — ABNORMAL LOW (ref 3.5–5.0)
Alkaline Phosphatase: 96 U/L (ref 40–150)
BUN: 14 mg/dL (ref 7.0–26.0)
CO2: 28 mEq/L (ref 22–29)
Calcium: 9.2 mg/dL (ref 8.4–10.4)
Chloride: 102 mEq/L (ref 98–107)
Creatinine: 1 mg/dL (ref 0.7–1.3)
Glucose: 135 mg/dl — ABNORMAL HIGH (ref 70–99)
Potassium: 3.3 mEq/L — ABNORMAL LOW (ref 3.5–5.1)
Sodium: 140 mEq/L (ref 136–145)
Total Bilirubin: 1.6 mg/dL — ABNORMAL HIGH (ref 0.20–1.20)
Total Protein: 6 g/dL — ABNORMAL LOW (ref 6.4–8.3)

## 2012-05-14 LAB — LACTATE DEHYDROGENASE (CC13): LDH: 215 U/L (ref 125–245)

## 2012-05-14 NOTE — Progress Notes (Signed)
[facility]  [phone]    OFFICE PROGRESS NOTE CC:   Gary Butler, M.D. Gary Butler, M.D. Gary Share, MD,FCCP  PROBLEM LIST: 1. 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 2. Hypertension. 3. Dyslipidemia. 4. Diabetes mellitus type 2. 5. 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 6. Morbid obesity. 7. Obstructive sleep apnea treated with CPAP for the past 10 years. 8. BPH. 9. 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  10.Bilateral tinnitus with onset approximately 2008. 11.Vertigo. 12.New diagnosis of Psoriatic Arthritis, on Methotrexate 2.5 mg, 6 po q week    HISTORY: Gary Butler returns today for followup of his erythrocytosis.  Gary Butler was last seen by [place] on 05/18/2011. He is being followed conservatelly.  In looking over his labs, the highest hematocrit that we see over the past year is 50.1.  The highest hemoglobin was 17.4,which occurred back on 02/23/2010.  The lowest hemoglobin and hematocrit we see were 14.5 and 43.1 on 05/23/2010.  As stated, Gary Butler was last seen by [place] approximately one year ago. There have been no medical problems during this time.  His main problem ispain in his knees, particularly his right knee where he had the knee removal of scar tissue 2 weeks ago with good results. He can ambulate better, without pain. Gary Butler remains quite active with involvement in a number of volunteer capacities with a variety of organizations. 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 He was recently diagnosed with psoriatic arthritis, now taking Methotrexate. He denies any significant complaints at this time.He is up to date with his primary care issues. His next physical is in 6 months.    MEDICATIONS:  Current Outpatient  Prescriptions  Medication Sig Dispense Refill  . albuterol (PROAIR HFA) 108 (90 BASE) MCG/ACT inhaler Inhale 2 puffs into the lungs every 6 (six) hours as needed for wheezing or shortness of breath.  1 Inhaler  3  . allopurinol (ZYLOPRIM) 300 MG tablet Take 1 tablet (300 mg total) by mouth daily.  30 tablet  5  . aspirin 81 MG tablet Take 81 mg by mouth daily.       Marland Kitchen atenolol (TENORMIN) 50 MG tablet Take 50 mg by mouth daily.        Marland Kitchen azithromycin (ZITHROMAX) 250 MG tablet 2 tabs on day 1, 1 tab on day 2-5  6 tablet  0  . cetirizine (ZYRTEC) 10 MG tablet Take 10 mg by mouth daily.      . chlorpheniramine-HYDROcodone (TUSSIONEX PENNKINETIC ER) 10-8 MG/5ML LQCR Take 5 mLs by mouth every 12 (twelve) hours.  140 mL  0  . Cholecalciferol (VITAMIN D) 2000 UNITS CAPS Take 1 tab by mouth once daily Mondays through Fridays.      . Cholecalciferol (VITAMIN D-3) 1000 UNITS CAPS Take 1 capsule by mouth.      . clobetasol (TEMOVATE) 0.05 % ointment as needed.       Marland Kitchen guaiFENesin-codeine (ROBITUSSIN AC) 100-10 MG/5ML syrup Take 10 mLs by mouth 3 (three) times daily as needed for cough.  240 mL  0  . ibuprofen (ADVIL,MOTRIN) 200 MG tablet Take 400 mg by mouth 3 (three) times daily as needed. For pain        . metroNIDAZOLE (FLAGYL) 500 MG tablet       . Multiple  Vitamins-Minerals (ICAPS) CAPS Take 1 capsule by mouth daily.        . Naproxen Sodium (ALEVE) 220 MG CAPS as needed.        . OXYCODONE HCL PO Take by mouth as needed.      . pioglitazone (ACTOS) 30 MG tablet Take 30 mg by mouth daily.        . potassium chloride SA (K-DUR,KLOR-CON) 20 MEQ tablet Take 1 tablet (20 mEq total) by mouth daily.  30 tablet  3  . predniSONE (DELTASONE) 20 MG tablet 2 tabs at the same time daily x5 days  10 tablet  0  . ramipril (ALTACE) 5 MG capsule Take 1 capsule (5 mg total) by mouth daily.  30 capsule  3  . simvastatin (ZOCOR) 40 MG tablet Take 1 tablet (40 mg total) by mouth at bedtime.  90 tablet  3  . Sodium  Hyaluronate, Viscosup, (HYALGAN) 20 MG/2ML SOLN Injection into the knee as needed.       . traMADol (ULTRAM) 50 MG tablet as needed.       . triamterene-hydrochlorothiazide (MAXZIDE) 75-50 MG per tablet Take 0.5 tablets by mouth daily.  30 tablet  06  . atorvastatin (LIPITOR) 40 MG tablet       . folic acid (FOLVITE) 1 MG tablet       . HYDROcodone-acetaminophen (NORCO/VICODIN) 5-325 MG per tablet       . methotrexate (RHEUMATREX) 2.5 MG tablet        No current facility-administered medications for this visit.    ALLERGIES:   Allergies  Allergen Reactions  . Lyrica (Pregabalin) Swelling  . Penicillins Swelling  . Levofloxacin Rash     PHYSICAL EXAMINATION:   Filed Vitals:   05/14/12 1043  BP: 119/71  Pulse: 63  Temp: 97 F (36.1 C)  Resp: 22   Filed Weights   05/14/12 1043  Weight: 341 lb (154.677 kg)     General:  Gary Butler looks well.  He is somewhat cherubic with facial plethora. HEENT:  There is no scleral icterus.  Mouth and pharynx arebenign.  Lymphatic:  There is no peripheral adenopathy palpable.  Heartand Lungs:  Normal.  Abdomen:  Massively obese.  Unable toappreciate any organ enlargement, masses, etc.  Extremities:  He has 1-to 2+ ankle edema.  Neurologic Exam:  Grossly normal. O2 sat was 97%.   LABORATORY/RADIOLOGY DATA:   Recent Labs Lab 05/14/12 0856  WBC 7.5  HGB 16.1  HCT 47.3  PLT 154  MCV 93.6  MCH 31.9  MCHC 34.1  RDW 14.5  LYMPHSABS 1.0  MONOABS 0.5  EOSABS 0.4  BASOSABS 0.0    CMP    Recent Labs Lab 05/14/12 0856  NA 140  K 3.3*  CL 102  CO2 28  GLUCOSE 135*  BUN 14.0  CREATININE 1.0  CALCIUM 9.2  AST 20  ALT 21  ALKPHOS 96  BILITOT 1.60*        Component Value Date/Time   BILITOT 1.60* 05/14/2012 0856   BILITOT 1.8* 06/20/2011 1432   BILIDIR 0.3 06/20/2011 1432    Other lab data:  P50 on 06/28/2011 was 27.1 mmHg (22.0-28.0)  EPO level was 14.1 on 06/22/2011 and 18.4 on 02/23/2010, both in the normal range, i.e. not  low.  JAK2 exon 12 mutation was not detected.  Hemoglobin 17.4 and hematocrit 50.8 on 06/20/2011. On 05/18/11, white count 5.9, ANC 4.1, hemoglobin 17.0,hematocrit 50.1, platelets 152,000.  His Carboxyhemoglobin was 3.     IMAGING  STUDIES:   1. Chest x-ray, 2 view, from 06/14/2008 showed no acutecardiopulmonary disease with a stable appearance when compared with thechest x-ray of 01/17/2005.  2. X-ray of the right toe, 2 views, on09/10/2010 showed degenerative findings of the 1st metatarsophalangeal joint; otherwise, no significant abnormalities noted. 3. CXR on 05/18/11 wan negative for active disease. 4.Bone scan on 12/10/11 with focal areas of intake on the medial tibial left knee condyle likely DJD.R knee showed aseptic losening of the device.  IMPRESSION AND PLAN: erythrocytosis continues to be controlled.However, his carboxyhemoglobin was 3. Will proceed with abdominal ultrasound to assess his spleen for this cannot be fully assesed via physical exam due to body habitus. Polycytemia Dwana Curd will need to be ruled out. Will return in one year with labs, including CBC with diff, CMET, LDH.He knows to call in the interim if he has any questions or concerns.      Shakeena Kafer E, PA-C 05/14/2012, 1:20 PM

## 2012-05-14 NOTE — Patient Instructions (Signed)
Follow up in year with labs with Dr. Arline Asp  Abdominal Ultrasound within this week.

## 2012-05-14 NOTE — Telephone Encounter (Signed)
I called pt to let him know that his potassium is 3.3. I asked him if he has been taking his potassium and he states that he has just restarted. He had surgery and they asked him to hold it. He will continue to take daily as prescribed.

## 2012-05-15 ENCOUNTER — Ambulatory Visit: Payer: Medicare Other | Admitting: Oncology

## 2012-05-15 ENCOUNTER — Other Ambulatory Visit: Payer: Medicare Other | Admitting: Lab

## 2012-05-19 ENCOUNTER — Ambulatory Visit (HOSPITAL_COMMUNITY)
Admission: RE | Admit: 2012-05-19 | Discharge: 2012-05-19 | Disposition: A | Discharge status: Home / Self Care | Payer: Medicare Other | Source: Ambulatory Visit | Attending: Physician Assistant | Admitting: Physician Assistant

## 2012-05-19 ENCOUNTER — Other Ambulatory Visit: Payer: Self-pay | Admitting: Physician Assistant

## 2012-05-19 DIAGNOSIS — R161 Splenomegaly, not elsewhere classified: Secondary | ICD-10-CM | POA: Insufficient documentation

## 2012-05-19 DIAGNOSIS — D751 Secondary polycythemia: Secondary | ICD-10-CM

## 2012-06-06 ENCOUNTER — Telehealth: Payer: Self-pay | Admitting: General Practice

## 2012-06-06 NOTE — Telephone Encounter (Signed)
Atorvastatin is a stronger med and since Dr Sharl Ma recommended this, he should take this.  Hope that helps!

## 2012-06-06 NOTE — Telephone Encounter (Signed)
Pt called today and stated that Dr. Beverely Low has him on simvastatin. Pt went to Endo DR. Lafayette Dragon and they placed him on atorvastatin. Pt is now confused as to which med he is suppose to be taking. Please advise.

## 2012-06-06 NOTE — Telephone Encounter (Signed)
Pt notified to take the atorvastatin.

## 2012-06-09 ENCOUNTER — Ambulatory Visit (INDEPENDENT_AMBULATORY_CARE_PROVIDER_SITE_OTHER): Payer: Medicare Other | Admitting: Pulmonary Disease

## 2012-06-09 ENCOUNTER — Encounter: Payer: Self-pay | Admitting: Pulmonary Disease

## 2012-06-09 VITALS — BP 112/70 | HR 63 | Temp 97.0°F | Ht 71.5 in | Wt 342.4 lb

## 2012-06-09 DIAGNOSIS — G4733 Obstructive sleep apnea (adult) (pediatric): Secondary | ICD-10-CM

## 2012-06-09 NOTE — Assessment & Plan Note (Signed)
The patient is doing very well with CPAP, and feels that his sleep is excellent as well as his daytime alertness.  He is having no issues with his mask or pressure, and is due for a mask change at this time.  I have asked him to continue with CPAP, and also to work aggressively on weight loss.  He will follow up with me in one year.

## 2012-06-09 NOTE — Patient Instructions (Addendum)
Stay on cpap, and keep up with supplies and mask changes.  Work on weight loss followup with me in one year if doing well.

## 2012-06-09 NOTE — Progress Notes (Signed)
  Subjective:    Patient ID: Gary Butler, male    DOB: 10/27/1945, 67 y.o.   MRN: [id number]  HPI The patient comes in today for followup of his obstructive sleep apnea.  He is wearing CPAP compliantly, and feels that he sleeps well with excellent daytime alertness.  He has kept up with his supplies, but is due for a new mask at this time.  Of note, his weight is up 2 pounds since his last visit.   Review of Systems  Constitutional: Negative for fever and unexpected weight change.  HENT: Negative for ear pain, nosebleeds, congestion, sore throat, rhinorrhea, sneezing, trouble swallowing, dental problem, postnasal drip and sinus pressure.        Allergies   Eyes: Negative for redness and itching.  Respiratory: Negative for cough, chest tightness, shortness of breath and wheezing.   Cardiovascular: Positive for leg swelling ( right knee/leg). Negative for palpitations.  Gastrointestinal: Negative for nausea and vomiting.  Genitourinary: Negative for dysuria.  Musculoskeletal: Negative for joint swelling.  Skin: Positive for rash ( psoriatic arthritis -- tx w/Methotrexate).  Neurological: Negative for headaches.  Hematological: Does not bruise/bleed easily.  Psychiatric/Behavioral: Negative for dysphoric mood. The patient is not nervous/anxious.        Objective:   Physical Exam Obese male in no acute distress Nose without purulent discharge noted No skin breakdown or pressure necrosis from the CPAP mask Neck without lymphadenopathy or thyromegaly Lower extremities with minimal edema, no cyanosis Alert and oriented, moves all 4 extremities.       Assessment & Plan:

## 2012-06-15 ENCOUNTER — Other Ambulatory Visit: Payer: Self-pay | Admitting: Family Medicine

## 2012-08-08 ENCOUNTER — Other Ambulatory Visit: Payer: Self-pay | Admitting: Family Medicine

## 2012-09-01 ENCOUNTER — Encounter: Payer: Self-pay | Admitting: Family Medicine

## 2012-09-01 ENCOUNTER — Ambulatory Visit (INDEPENDENT_AMBULATORY_CARE_PROVIDER_SITE_OTHER): Payer: Medicare Other | Admitting: Family Medicine

## 2012-09-01 VITALS — BP 104/60 | HR 80 | Temp 98.0°F | Ht 69.75 in | Wt 337.0 lb

## 2012-09-01 DIAGNOSIS — J40 Bronchitis, not specified as acute or chronic: Secondary | ICD-10-CM

## 2012-09-01 DIAGNOSIS — J209 Acute bronchitis, unspecified: Secondary | ICD-10-CM

## 2012-09-01 DIAGNOSIS — J019 Acute sinusitis, unspecified: Secondary | ICD-10-CM

## 2012-09-01 MED ORDER — PROMETHAZINE-DM 6.25-15 MG/5ML PO SYRP: 5.0000 mL | ORAL_SOLUTION | Freq: Four times a day (QID) | ORAL | Status: DC | PRN

## 2012-09-01 MED ORDER — AZITHROMYCIN 250 MG PO TABS: ORAL_TABLET | ORAL | Status: DC

## 2012-09-01 NOTE — Assessment & Plan Note (Signed)
Pt's sxs and PE consistent w/ infxn.  Start abx.  Reviewed supportive care and red flags that should prompt return.  Pt expressed understanding and is in agreement w/ plan.  

## 2012-09-01 NOTE — Progress Notes (Signed)
  Subjective:    Patient ID: Gary Butler, male    DOB: 22-Aug-1945, 67 y.o.   MRN: [id number]  HPI URI- sxs started 3 days ago.  'just feeling bad'.  No fevers.  + sinus pressure.  Cough is productive of 'yellow crap'.  No ear pain.  + sore throat.  No N/V/D.  No known sick contacts but has been doing a lot of public appearances/events.   Review of Systems For ROS see HPI     Objective:   Physical Exam  Vitals reviewed. Constitutional: He appears well-developed and well-nourished. No distress.  HENT:  Head: Normocephalic and atraumatic.  Right Ear: Tympanic membrane normal.  Left Ear: Tympanic membrane normal.  Nose: Mucosal edema and rhinorrhea present. Right sinus exhibits maxillary sinus tenderness. Right sinus exhibits no frontal sinus tenderness. Left sinus exhibits maxillary sinus tenderness. Left sinus exhibits no frontal sinus tenderness.  Mouth/Throat: Mucous membranes are normal. Oropharyngeal exudate and posterior oropharyngeal erythema present. No posterior oropharyngeal edema.  + PND  Eyes: Conjunctivae and EOM are normal. Pupils are equal, round, and reactive to light.  Neck: Normal range of motion. Neck supple.  Cardiovascular: Normal rate, regular rhythm and normal heart sounds.   Pulmonary/Chest: Effort normal and breath sounds normal. No respiratory distress. He has no wheezes.  + hacking cough  Lymphadenopathy:    He has no cervical adenopathy.  Skin: Skin is warm and dry.          Assessment & Plan:

## 2012-09-01 NOTE — Assessment & Plan Note (Signed)
Pt w/ hx of similar.  abx for sinusitis should improve sxs.  Start cough meds prn.  Reviewed supportive care and red flags that should prompt return.  Pt expressed understanding and is in agreement w/ plan.

## 2012-09-01 NOTE — Patient Instructions (Addendum)
Start the Zpack as directed Use the cough syrup for nights and weekends- will cause drowsiness Mucinex DM for daytime cough Drink plenty of fluids REST! Hang in there!!!

## 2012-09-05 ENCOUNTER — Other Ambulatory Visit: Payer: Self-pay | Admitting: Family Medicine

## 2012-09-05 NOTE — Telephone Encounter (Signed)
Rx sent to the pharmacy by e-script.  Also sent a note that the pt is due an OV.  Pt needs f/u on HTN.//AB/CMA

## 2012-09-10 ENCOUNTER — Telehealth: Payer: Self-pay | Admitting: Family Medicine

## 2012-09-10 MED ORDER — BENZONATATE 200 MG PO CAPS: 200.0000 mg | ORAL_CAPSULE | Freq: Three times a day (TID) | ORAL | Status: DC | PRN

## 2012-09-10 NOTE — Telephone Encounter (Signed)
Ok for [employer] 200mg  TID prn.  #60. Should take OTC mucinex DM as well

## 2012-09-10 NOTE — Telephone Encounter (Signed)
OV was 09/01/12. Patient states that he feels like he is coughing all the time. It is productive and all other symptoms have subsided. Please advise

## 2012-09-10 NOTE — Telephone Encounter (Signed)
Patient came in last week Monday, 7/21, and was given medication for his symptoms. States that the majority of his symptoms have subsided after finishing his medications but he still has a cough with phlegm. Wants to know if something can be sent to his pharmacy for the cough.

## 2012-09-10 NOTE — Telephone Encounter (Signed)
Patient notified- Rx sent to pharmacy. 

## 2012-09-15 ENCOUNTER — Encounter: Payer: Self-pay | Admitting: Family Medicine

## 2012-09-17 ENCOUNTER — Other Ambulatory Visit: Payer: Self-pay

## 2012-09-18 ENCOUNTER — Other Ambulatory Visit: Payer: Self-pay | Admitting: Family Medicine

## 2012-09-18 NOTE — Telephone Encounter (Signed)
Rx sent to the pharmacy by e-script.//AB/CMA 

## 2012-10-08 ENCOUNTER — Other Ambulatory Visit: Payer: Self-pay | Admitting: Family Medicine

## 2012-10-24 ENCOUNTER — Other Ambulatory Visit: Payer: Self-pay | Admitting: Family Medicine

## 2012-10-24 NOTE — Telephone Encounter (Signed)
Needs OV.  

## 2012-11-17 ENCOUNTER — Other Ambulatory Visit: Payer: Self-pay | Admitting: Family Medicine

## 2012-11-18 NOTE — Telephone Encounter (Signed)
Med filled.  

## 2012-12-08 ENCOUNTER — Other Ambulatory Visit: Payer: Self-pay | Admitting: Family Medicine

## 2012-12-08 NOTE — Telephone Encounter (Signed)
Med filled.  

## 2013-02-12 ENCOUNTER — Other Ambulatory Visit: Payer: Self-pay | Admitting: Family Medicine

## 2013-02-12 DIAGNOSIS — Z8614 Personal history of Methicillin resistant Staphylococcus aureus infection: Secondary | ICD-10-CM | POA: Insufficient documentation

## 2013-02-13 NOTE — Telephone Encounter (Signed)
Med filled.  

## 2013-02-18 ENCOUNTER — Other Ambulatory Visit: Payer: Self-pay | Admitting: Family Medicine

## 2013-02-18 NOTE — Telephone Encounter (Signed)
Med filled.  

## 2013-03-17 ENCOUNTER — Other Ambulatory Visit: Payer: Self-pay | Admitting: Family Medicine

## 2013-03-18 NOTE — Telephone Encounter (Signed)
Pt needs ov for meds.

## 2013-03-25 LAB — LIPID PANEL
Cholesterol: 90 mg/dL (ref 0–200)
HDL: 38 mg/dL (ref 35–70)
LDL Cholesterol: 35 mg/dL
LDl/HDL Ratio: 2.4
Triglycerides: 85 mg/dL (ref 40–160)

## 2013-03-25 LAB — HEPATIC FUNCTION PANEL
Alkaline Phosphatase: 88 U/L (ref 25–125)
Bilirubin, Total: 1.3 mg/dL

## 2013-03-25 LAB — BASIC METABOLIC PANEL
BUN: 17 mg/dL (ref 4–21)
Creatinine: 1 mg/dL (ref <=1.3)
Glucose: 128 mg/dL
Sodium: 136 mmol/L — AB (ref 137–147)

## 2013-03-25 LAB — TSH: TSH: 4.27 u[IU]/mL (ref <=5.90)

## 2013-03-30 ENCOUNTER — Telehealth: Payer: Self-pay | Admitting: Internal Medicine

## 2013-03-30 NOTE — Telephone Encounter (Signed)
Talked to pt he is aware of r/s appt to 4/16 lab and MD due to MD's PAL

## 2013-04-10 ENCOUNTER — Other Ambulatory Visit (HOSPITAL_COMMUNITY): Payer: Self-pay | Admitting: Orthopedic Surgery

## 2013-04-10 DIAGNOSIS — Z96659 Presence of unspecified artificial knee joint: Secondary | ICD-10-CM

## 2013-04-15 ENCOUNTER — Ambulatory Visit (HOSPITAL_COMMUNITY)
Admission: RE | Admit: 2013-04-15 | Discharge: 2013-04-15 | Disposition: A | Discharge status: Home / Self Care | Payer: Medicare Other | Source: Ambulatory Visit | Attending: Orthopedic Surgery | Admitting: Orthopedic Surgery

## 2013-04-15 ENCOUNTER — Encounter (HOSPITAL_COMMUNITY)
Admission: RE | Admit: 2013-04-15 | Discharge: 2013-04-15 | Disposition: A | Discharge status: Home / Self Care | Payer: Medicare Other | Source: Ambulatory Visit | Attending: Orthopedic Surgery | Admitting: Orthopedic Surgery

## 2013-04-15 DIAGNOSIS — Z96659 Presence of unspecified artificial knee joint: Secondary | ICD-10-CM | POA: Insufficient documentation

## 2013-04-15 DIAGNOSIS — M1710 Unilateral primary osteoarthritis, unspecified knee: Secondary | ICD-10-CM | POA: Insufficient documentation

## 2013-04-15 DIAGNOSIS — IMO0002 Reserved for concepts with insufficient information to code with codable children: Secondary | ICD-10-CM | POA: Insufficient documentation

## 2013-04-15 MED ORDER — TECHNETIUM TC 99M MEDRONATE IV KIT
24.7000 | PACK | Freq: Once | INTRAVENOUS | Status: AC | PRN
  Administered 2013-04-15: 24.7 via INTRAVENOUS

## 2013-04-20 ENCOUNTER — Telehealth: Payer: Self-pay | Admitting: *Deleted

## 2013-04-20 NOTE — Telephone Encounter (Signed)
Patient states that he is having another episode of vertigo. He would like to know if he needs to be seen by a specialist or by Dr. Birdie Riddle. Please advise. JG//CMA

## 2013-04-20 NOTE — Telephone Encounter (Signed)
C/o vertigo, decreased hearing ability, and ringing in ears.  Wears bilateral hearing aids.  Not sure of the cause.  Appointment scheduled for Dr. Birdie Riddle on April 21, 2013 @ 0915.  ASL

## 2013-04-21 ENCOUNTER — Encounter: Payer: Self-pay | Admitting: Family Medicine

## 2013-04-21 ENCOUNTER — Ambulatory Visit (INDEPENDENT_AMBULATORY_CARE_PROVIDER_SITE_OTHER): Payer: Medicare Other | Admitting: Family Medicine

## 2013-04-21 VITALS — BP 124/76 | HR 68 | Temp 98.2°F | Resp 16 | Wt 343.4 lb

## 2013-04-21 DIAGNOSIS — H8109 Meniere's disease, unspecified ear: Secondary | ICD-10-CM | POA: Insufficient documentation

## 2013-04-21 DIAGNOSIS — H8110 Benign paroxysmal vertigo, unspecified ear: Secondary | ICD-10-CM | POA: Insufficient documentation

## 2013-04-21 MED ORDER — ONDANSETRON HCL 4 MG PO TABS: 4.0000 mg | ORAL_TABLET | Freq: Three times a day (TID) | ORAL | Status: DC | PRN

## 2013-04-21 MED ORDER — MECLIZINE HCL 25 MG PO TABS: 25.0000 mg | ORAL_TABLET | Freq: Three times a day (TID) | ORAL | Status: DC | PRN

## 2013-04-21 NOTE — Assessment & Plan Note (Signed)
New.  Start Meclizine and Zofran prn.  Reviewed supportive care and red flags that should prompt return.  Pt expressed understanding and is in agreement w/ plan.

## 2013-04-21 NOTE — Patient Instructions (Signed)
Follow up as needed Start the Meclizine as needed for dizziness Drink plenty of fluids Change positions slowly We'll call you with your ENT appt Take the Zofran as needed for nausea Call with any questions or concerns [place]!!!

## 2013-04-21 NOTE — Progress Notes (Signed)
   Subjective:    Patient ID: Gary Butler, male    DOB: 13-Dec-1945, 68 y.o.   MRN: [id number]  Dizziness   Vertigo- pt noticed decreased hearing 2 weeks ago but then developed dizziness/vertigo 3-4 days.  Now w/ increased tinnitus.  Not currently seeing ENT.  + nausea w/ the vertigo.  No ear pain, sinus pressure, drainage.   Review of Systems  Neurological: Positive for dizziness.   For ROS see HPI     Objective:   Physical Exam  Vitals reviewed. Constitutional: He is oriented to person, place, and time. He appears well-developed and well-nourished. No distress.  HENT:  Head: Normocephalic and atraumatic.  TMs WNL No TTP over sinuses  Eyes: Conjunctivae and EOM are normal. Pupils are equal, round, and reactive to light.  Neurological: He is alert and oriented to person, place, and time. No cranial nerve deficit. Coordination normal.  Skin: Skin is warm and dry. No rash noted. No erythema.  Psychiatric: He has a normal mood and affect. His behavior is normal. Thought content normal.          Assessment & Plan:

## 2013-04-21 NOTE — Assessment & Plan Note (Signed)
New to provider.  Pt has seen audiology but never ENT.  Referral made.  tx vertigo w/ meclizine.  Will follow.

## 2013-05-01 ENCOUNTER — Other Ambulatory Visit: Payer: Self-pay | Admitting: *Deleted

## 2013-05-01 MED ORDER — ALLOPURINOL 300 MG PO TABS: ORAL_TABLET | ORAL | Status: DC

## 2013-05-13 LAB — HM DIABETES EYE EXAM

## 2013-05-14 ENCOUNTER — Ambulatory Visit: Payer: Medicare Other

## 2013-05-14 ENCOUNTER — Telehealth: Payer: Self-pay | Admitting: Internal Medicine

## 2013-05-14 ENCOUNTER — Other Ambulatory Visit: Payer: Medicare Other

## 2013-05-14 NOTE — Telephone Encounter (Signed)
per pt appts r/s from 4/16 to 4/13 - pt has new d/t

## 2013-05-25 ENCOUNTER — Other Ambulatory Visit: Payer: Medicare Other

## 2013-05-25 ENCOUNTER — Other Ambulatory Visit: Payer: Self-pay | Admitting: Internal Medicine

## 2013-05-25 ENCOUNTER — Ambulatory Visit: Payer: Medicare Other

## 2013-05-25 DIAGNOSIS — D751 Secondary polycythemia: Secondary | ICD-10-CM

## 2013-05-26 ENCOUNTER — Telehealth: Payer: Self-pay | Admitting: Internal Medicine

## 2013-05-26 NOTE — Telephone Encounter (Signed)
s/w pt re new appt for 5/6

## 2013-05-28 ENCOUNTER — Other Ambulatory Visit: Payer: Medicare Other

## 2013-05-28 ENCOUNTER — Ambulatory Visit: Payer: Medicare Other

## 2013-05-29 ENCOUNTER — Telehealth: Payer: Self-pay | Admitting: *Deleted

## 2013-05-29 NOTE — Telephone Encounter (Signed)
Received surgery clearance form via fax from [place]. Patient needs surgical clearance appt. Called and let VM for patient to please return call. JG//CMA

## 2013-05-29 NOTE — Progress Notes (Signed)
Surgery on 06/22/13.  Need orders in EPIC.  Thank You.

## 2013-05-30 ENCOUNTER — Other Ambulatory Visit: Payer: Self-pay | Admitting: Family Medicine

## 2013-06-01 NOTE — Telephone Encounter (Signed)
Med filled, letter mailed to pt to schedule BP follow up.

## 2013-06-02 ENCOUNTER — Encounter: Payer: Self-pay | Admitting: General Practice

## 2013-06-05 ENCOUNTER — Encounter (HOSPITAL_COMMUNITY): Payer: Self-pay | Admitting: Pharmacy Technician

## 2013-06-09 ENCOUNTER — Ambulatory Visit (INDEPENDENT_AMBULATORY_CARE_PROVIDER_SITE_OTHER): Payer: Medicare Other | Admitting: Pulmonary Disease

## 2013-06-09 ENCOUNTER — Encounter: Payer: Self-pay | Admitting: Pulmonary Disease

## 2013-06-09 VITALS — BP 120/70 | HR 70 | Temp 97.8°F | Ht 71.5 in | Wt 345.6 lb

## 2013-06-09 DIAGNOSIS — G4733 Obstructive sleep apnea (adult) (pediatric): Secondary | ICD-10-CM

## 2013-06-09 NOTE — Patient Instructions (Signed)
Continue on CPAP, and keep up with mask changes and supplies Work on weight loss Followup with me in one year.

## 2013-06-09 NOTE — Progress Notes (Signed)
   Subjective:    Patient ID: Gary Butler, male    DOB: 1945/11/22, 68 y.o.   MRN: [id number]  HPI The patient comes in today for followup of his obstructive sleep apnea. He is wearing CPAP compliantly, and is having no issues with his mask or pressure. He feels that he is sleeping well, and has adequate daytime alertness. His weight is up 3 pounds since the last visit.   Review of Systems  Constitutional: Negative for fever and unexpected weight change.  HENT: Negative for congestion, dental problem, ear pain, nosebleeds, postnasal drip, rhinorrhea, sinus pressure, sneezing, sore throat and trouble swallowing.   Eyes: Negative for redness and itching.  Respiratory: Negative for cough, chest tightness, shortness of breath and wheezing.   Cardiovascular: Negative for palpitations and leg swelling.  Gastrointestinal: Negative for nausea and vomiting.  Genitourinary: Negative for dysuria.  Musculoskeletal: Negative for joint swelling.  Skin: Negative for rash.  Neurological: Negative for headaches.  Hematological: Does not bruise/bleed easily.  Psychiatric/Behavioral: Negative for dysphoric mood. The patient is not nervous/anxious.        Objective:   Physical Exam Obese male in no acute distress Nose without purulence or discharge noted No skin breakdown or pressure necrosis from the CPAP mask Neck without lymphadenopathy or thyromegaly Lower extremities with mild edema, no cyanosis Alert and oriented, does not appear to be sleepy, moves all 4 extremities.       Assessment & Plan:

## 2013-06-09 NOTE — Assessment & Plan Note (Signed)
The patient is wearing CPAP compliantly, and feels that he is sleeping well with the device. He is also satisfied with his daytime alertness. I've asked him to keep up with his mask changes and supplies, and to work aggressively on weight loss. I will see him back in one year doing well.

## 2013-06-10 ENCOUNTER — Ambulatory Visit (HOSPITAL_COMMUNITY)
Admission: RE | Admit: 2013-06-10 | Discharge: 2013-06-10 | Disposition: A | Discharge status: Home / Self Care | Payer: Medicare Other | Source: Ambulatory Visit | Attending: Orthopedic Surgery | Admitting: Orthopedic Surgery

## 2013-06-10 ENCOUNTER — Encounter (HOSPITAL_COMMUNITY)
Admission: RE | Admit: 2013-06-10 | Discharge: 2013-06-10 | Disposition: A | Discharge status: Home / Self Care | Payer: Medicare Other | Source: Ambulatory Visit | Attending: Orthopedic Surgery | Admitting: Orthopedic Surgery

## 2013-06-10 ENCOUNTER — Encounter (HOSPITAL_COMMUNITY): Payer: Self-pay

## 2013-06-10 DIAGNOSIS — Z01818 Encounter for other preprocedural examination: Secondary | ICD-10-CM | POA: Insufficient documentation

## 2013-06-10 DIAGNOSIS — Z01810 Encounter for preprocedural cardiovascular examination: Secondary | ICD-10-CM | POA: Insufficient documentation

## 2013-06-10 HISTORY — DX: Arthropathic psoriasis, unspecified: L40.50

## 2013-06-10 HISTORY — DX: Tinnitus, unspecified ear: H93.19

## 2013-06-10 HISTORY — DX: Personal history of other malignant neoplasm of skin: Z85.828

## 2013-06-10 HISTORY — DX: Constipation, unspecified: K59.00

## 2013-06-10 HISTORY — DX: Personal history of other diseases of the musculoskeletal system and connective tissue: Z87.39

## 2013-06-10 LAB — PROTIME-INR
INR: 1.02 (ref 0.00–1.49)
Prothrombin Time: 13.2 seconds (ref 11.6–15.2)

## 2013-06-10 LAB — BASIC METABOLIC PANEL
BUN: 18 mg/dL (ref 6–23)
CO2: 31 mEq/L (ref 19–32)
Calcium: 9.5 mg/dL (ref 8.4–10.5)
Chloride: 98 mEq/L (ref 96–112)
Creatinine, Ser: 1.01 mg/dL (ref 0.50–1.35)
GFR calc Af Amer: 86 mL/min — ABNORMAL LOW (ref >90)
GFR calc non Af Amer: 74 mL/min — ABNORMAL LOW (ref >90)
Glucose, Bld: 189 mg/dL — ABNORMAL HIGH (ref 70–99)
Potassium: 3.7 mEq/L (ref 3.7–5.3)
Sodium: 138 mEq/L (ref 137–147)

## 2013-06-10 LAB — URINALYSIS, ROUTINE W REFLEX MICROSCOPIC
Bilirubin Urine: NEGATIVE
Glucose, UA: 100 mg/dL — AB
Hgb urine dipstick: NEGATIVE
Ketones, ur: NEGATIVE mg/dL
Leukocytes, UA: NEGATIVE
Nitrite: NEGATIVE
Protein, ur: NEGATIVE mg/dL
Specific Gravity, Urine: 1.024 (ref 1.005–1.030)
Urobilinogen, UA: 1 mg/dL (ref 0.0–1.0)
pH: 6 (ref 5.0–8.0)

## 2013-06-10 LAB — CBC
HCT: 44.6 % (ref 39.0–52.0)
Hemoglobin: 15.1 g/dL (ref 13.0–17.0)
MCH: 30.8 pg (ref 26.0–34.0)
MCHC: 33.9 g/dL (ref 30.0–36.0)
MCV: 91 fL (ref 78.0–100.0)
Platelets: 179 10*3/uL (ref 150–400)
RBC: 4.9 MIL/uL (ref 4.22–5.81)
RDW: 15.5 % (ref 11.5–15.5)
WBC: 6.8 10*3/uL (ref 4.0–10.5)

## 2013-06-10 LAB — SURGICAL PCR SCREEN
MRSA, PCR: POSITIVE — AB
Staphylococcus aureus: POSITIVE — AB

## 2013-06-10 LAB — APTT: aPTT: 32 seconds (ref 24–37)

## 2013-06-10 NOTE — Patient Instructions (Addendum)
Gary Butler  06/10/2013                           YOUR PROCEDURE IS SCHEDULED ON: 06/22/13 AT 12:40 PM               PLEASE REPORT TO SHORT STAY CENTER AT : 9:45 AM               CALL THIS NUMBER IF ANY PROBLEMS THE DAY OF SURGERY :               [phone]                                REMEMBER:   Do not eat food  AFTER MIDNIGHT   May have clear liquids UNTIL 6 HOURS BEFORE SURGERY (6:45 AM)   CLEAR LIQUID DIET   Foods Allowed                                                                     Foods Excluded  Coffee and tea, regular and decaf                             liquids that you cannot  Plain Jell-O in any flavor                                             see through such as: Fruit ices (not with fruit pulp)                                     milk, soups, orange juice  Iced Popsicles                                    All solid food Carbonated beverages, regular and diet                                    Cranberry, grape and apple juices Sports drinks like Gatorade Lightly seasoned clear broth or consume(fat free) Sugar, honey syrup  _____________________________________________________________________                 Take these medicines the morning of surgery with               A SIPS OF WATER :     ALLOPURINOL / ATENOLOL              BRING C PAP MASK AND TUBING TO HOSPITAL     Do not wear jewelry, make-up   Do not wear lotions, powders, or perfumes.   Do not shave legs or underarms 12 hrs. before surgery (men may shave face)  Do not bring valuables to the hospital.  Contacts, dentures or bridgework may not be worn into  surgery.  Leave suitcase in the car. After surgery it may be brought to your room.  For patients admitted to the hospital more than one night, checkout time is            11:00 AM                                                    ________________________________________________________________________                                    [facility] - Preparing for Surgery Before surgery, you can play an important role.  Because skin is not sterile, your skin needs to be as free of germs as possible.  You can reduce the number of germs on your skin by washing with CHG (chlorahexidine gluconate) soap before surgery.  CHG is an antiseptic cleaner which kills germs and bonds with the skin to continue killing germs even after washing. Please DO NOT use if you have an allergy to CHG or antibacterial soaps.  If your skin becomes reddened/irritated stop using the CHG and inform your nurse when you arrive at Short Stay. Do not shave (including legs and underarms) for at least 48 hours prior to the first CHG shower.  You may shave your face. Please follow these instructions carefully:  1.  Shower with CHG Soap the night before surgery and the  morning of Surgery.   2.  If you choose to wash your hair, wash your hair first as usual with your  normal  Shampoo.   3.  After you shampoo, rinse your hair and body thoroughly to remove the  shampoo.                                         4.  Use CHG as you would any other liquid soap.  You can apply chg directly  to the skin and wash . Gently wash with scrungie or clean wascloth    5.  Apply the CHG Soap to your body ONLY FROM THE NECK DOWN.   Do not use on open                           Wound or open sores. Avoid contact with eyes, ears mouth and genitals (private parts).                        Genitals (private parts) with your normal soap.              6.  Wash thoroughly, paying special attention to the area where your surgery  will be performed.   7.  Thoroughly rinse your body with warm water from the neck down.   8.  DO NOT shower/wash with your normal soap after using and rinsing off  the CHG Soap .                9.  Pat yourself dry with a clean towel.             10.  Wear clean pajamas.  11.  Place clean sheets on your bed the night of your first shower  and do not  sleep with pets.  Day of Surgery : Do not apply any lotions/deodorants the morning of surgery.  Please wear clean clothes to the hospital/surgery center.  FAILURE TO FOLLOW THESE INSTRUCTIONS MAY RESULT IN THE CANCELLATION OF YOUR SURGERY    PATIENT SIGNATURE_________________________________     Incentive Spirometer  An incentive spirometer is a tool that can help keep your lungs clear and active. This tool measures how well you are filling your lungs with each breath. Taking long deep breaths may help reverse or decrease the chance of developing breathing (pulmonary) problems (especially infection) following:  A long period of time when you are unable to move or be active. BEFORE THE PROCEDURE   If the spirometer includes an indicator to show your best effort, your nurse or respiratory therapist will set it to a desired goal.  If possible, sit up straight or lean slightly forward. Try not to slouch.  Hold the incentive spirometer in an upright position. INSTRUCTIONS FOR USE  1. Sit on the edge of your bed if possible, or sit up as far as you can in bed or on a chair. 2. Hold the incentive spirometer in an upright position. 3. Breathe out normally. 4. Place the mouthpiece in your mouth and seal your lips tightly around it. 5. Breathe in slowly and as deeply as possible, raising the piston or the ball toward the top of the column. 6. Hold your breath for 3-5 seconds or for as long as possible. Allow the piston or ball to fall to the bottom of the column. 7. Remove the mouthpiece from your mouth and breathe out normally. 8. Rest for a few seconds and repeat Steps 1 through 7 at least 10 times every 1-2 hours when you are awake. Take your time and take a few normal breaths between deep breaths. 9. The spirometer may include an indicator to show your best effort. Use the indicator as a goal to work toward during each repetition. 10. After each set of 10 deep breaths,  practice coughing to be sure your lungs are clear. If you have an incision (the cut made at the time of surgery), support your incision when coughing by placing a pillow or rolled up towels firmly against it. Once you are able to get out of bed, walk around indoors and cough well. You may stop using the incentive spirometer when instructed by your caregiver.  RISKS AND COMPLICATIONS  Take your time so you do not get dizzy or light-headed.  If you are in pain, you may need to take or ask for pain medication before doing incentive spirometry. It is harder to take a deep breath if you are having pain. AFTER USE  Rest and breathe slowly and easily.  It can be helpful to keep track of a log of your progress. Your caregiver can provide you with a simple table to help with this. If you are using the spirometer at home, follow these instructions: [place] IF:   You are having difficultly using the spirometer.  You have trouble using the spirometer as often as instructed.  Your pain medication is not giving enough relief while using the spirometer.  You develop fever of 100.5 F (38.1 C) or higher. SEEK IMMEDIATE MEDICAL CARE IF:   You cough up bloody sputum that had not been present before.  You develop fever of 102 F (38.9  C) or greater.  You develop worsening pain at or near the incision site. MAKE SURE YOU:   Understand these instructions.  Will watch your condition.  Will get help right away if you are not doing well or get worse. Document Released: 06/11/2006 Document Revised: 04/23/2011 Document Reviewed: 08/12/2006 Exit[employer] Patient Information 2014 [employer], [place].   ________________________________________________________________________    WHAT IS A BLOOD TRANSFUSION? Blood Transfusion Information  A transfusion is the replacement of blood or some of its parts. Blood is made up of multiple cells which provide different functions.  Red blood cells carry  oxygen and are used for blood loss replacement.  White blood cells fight against infection.  Platelets control bleeding.  Plasma helps clot blood.  Other blood products are available for specialized needs, such as hemophilia or other clotting disorders. BEFORE THE TRANSFUSION  Who gives blood for transfusions?   Healthy volunteers who are fully evaluated to make sure their blood is safe. This is blood bank blood. Transfusion therapy is the safest it has ever been in the practice of medicine. Before blood is taken from a donor, a complete history is taken to make sure that person has no history of diseases nor engages in risky social behavior (examples are intravenous drug use or sexual activity with multiple partners). The donor's travel history is screened to minimize risk of transmitting infections, such as malaria. The donated blood is tested for signs of infectious diseases, such as HIV and hepatitis. The blood is then tested to be sure it is compatible with you in order to minimize the chance of a transfusion reaction. If you or a relative donates blood, this is often done in anticipation of surgery and is not appropriate for emergency situations. It takes many days to process the donated blood. RISKS AND COMPLICATIONS Although transfusion therapy is very safe and saves many lives, the main dangers of transfusion include:   Getting an infectious disease.  Developing a transfusion reaction. This is an allergic reaction to something in the blood you were given. Every precaution is taken to prevent this. The decision to have a blood transfusion has been considered carefully by your caregiver before blood is given. Blood is not given unless the benefits outweigh the risks. AFTER THE TRANSFUSION  Right after receiving a blood transfusion, you will usually feel much better and more energetic. This is especially true if your red blood cells have gotten low (anemic). The transfusion raises the  level of the red blood cells which carry oxygen, and this usually causes an energy increase.  The nurse administering the transfusion will monitor you carefully for complications. HOME CARE INSTRUCTIONS  No special instructions are needed after a transfusion. You may find your energy is better. Speak with your caregiver about any limitations on activity for underlying diseases you may have. SEEK MEDICAL CARE IF:   Your condition is not improving after your transfusion.  You develop redness or irritation at the intravenous (IV) site. SEEK IMMEDIATE MEDICAL CARE IF:  Any of the following symptoms occur over the next 12 hours:  Shaking chills.  You have a temperature by mouth above 102 F (38.9 C), not controlled by medicine.  Chest, back, or muscle pain.  People around you feel you are not acting correctly or are confused.  Shortness of breath or difficulty breathing.  Dizziness and fainting.  You get a rash or develop hives.  You have a decrease in urine output.  Your urine turns a dark color or changes  to pink, red, or brown. Any of the following symptoms occur over the next 10 days:  You have a temperature by mouth above 102 F (38.9 C), not controlled by medicine.  Shortness of breath.  Weakness after normal activity.  The white part of the eye turns yellow (jaundice).  You have a decrease in the amount of urine or are urinating less often.  Your urine turns a dark color or changes to pink, red, or brown. Document Released: 01/27/2000 Document Revised: 04/23/2011 Document Reviewed: 09/15/2007 [facility] Patient Information 2014 [place], [place].  _______________________________________________________________________

## 2013-06-11 NOTE — Progress Notes (Signed)
RX Mupuricin called to [place] notified

## 2013-06-12 NOTE — Progress Notes (Signed)
[facility] please review the order in [facility] for the OP permit. It does not say anything about surgery on the Right knee but the pt said he is having surgery on the Rt knee also. He won't be able to sign the permit until it is corrected. Thanks for your help with this

## 2013-06-15 ENCOUNTER — Ambulatory Visit (INDEPENDENT_AMBULATORY_CARE_PROVIDER_SITE_OTHER): Payer: Medicare Other | Admitting: Family Medicine

## 2013-06-15 ENCOUNTER — Encounter: Payer: Self-pay | Admitting: Family Medicine

## 2013-06-15 VITALS — BP 116/68 | HR 64 | Temp 98.0°F | Resp 16 | Ht 71.0 in | Wt 343.2 lb

## 2013-06-15 DIAGNOSIS — Z01818 Encounter for other preprocedural examination: Secondary | ICD-10-CM

## 2013-06-15 NOTE — Patient Instructions (Signed)
Follow up as needed I'll send the clearance note to Dr Alvan Dame At this time, there is no reason not to proceed Call with any questions or concerns GOOD LUCK!!!

## 2013-06-15 NOTE — Progress Notes (Signed)
Subjective:    Gary Butler is a 68 y.o. male who presents to the office today for a preoperative consultation at the request of surgeon Alvan Dame who plans on performing L TKR on May 11. This consultation is requested for the specific conditions prompting preoperative evaluation (i.e. because of potential affect on operative risk): DM, OSA. Planned anesthesia: general. The patient has the following known anesthesia issues: no previous problems w/ anesthesia. Patients bleeding risk: no recent abnormal bleeding, no remote history of abnormal bleeding and no use of Ca-channel blockers. Patient does not have objections to receiving blood products if needed.  The following portions of the patient's history were reviewed and updated as appropriate: allergies, current medications, past family history, past medical history, past social history, past surgical history and problem list.  Review of Systems A comprehensive review of systems was negative.    Objective:    BP 116/68  Pulse 64  Temp(Src) 98 F (36.7 C) (Oral)  Resp 16  Ht 5\' 11"  (1.803 m)  Wt 343 lb 4 oz (155.697 kg)  BMI 47.89 kg/m2  SpO2 94%  General Appearance:    Alert, cooperative, no distress, appears stated age  Head:    Normocephalic, without obvious abnormality, atraumatic  Eyes:    PERRL, conjunctiva/corneas clear, EOM's intact, fundi    benign, both eyes       Ears:    Normal TM's and external ear canals, both ears  Nose:   Nares normal, septum midline, mucosa normal, no drainage   or sinus tenderness  Throat:   Lips, mucosa, and tongue normal; teeth and gums normal  Neck:   Supple, symmetrical, trachea midline, no adenopathy;       thyroid:  No enlargement/tenderness/nodules  Back:     Symmetric, no curvature, ROM normal, no CVA tenderness  Lungs:     Clear to auscultation bilaterally, respirations unlabored  Chest wall:    No tenderness or deformity  Heart:    Regular rate and rhythm, S1 and S2 normal, no murmur, rub   or  gallop  Abdomen:     Soft, non-tender, bowel sounds active all four quadrants,    no masses, no organomegaly  Genitalia:    Deferred  Rectal:    Extremities:   Extremities normal, atraumatic, no cyanosis or edema  Pulses:   2+ and symmetric all extremities  Skin:   Skin color, texture, turgor normal, no rashes or lesions  Lymph nodes:   Cervical, supraclavicular, and axillary nodes normal  Neurologic:   CNII-XII intact. Normal strength, sensation and reflexes      throughout     Predictors of intubation difficulty:  Morbid obesity? yes  Anatomically abnormal facies? no  Prominent incisors? no  Receding mandible? no  Short, thick neck? yes   Neck range of motion: normal  Dentition: No chipped, loose, or missing teeth.  Cardiographics ECG: normal sinus rhythm, no blocks or conduction defects, no ischemic changes Echocardiogram: not done  Imaging Chest x-ray: NA   Lab Review  Hospital Outpatient Visit on 06/10/2013  Component Date Value  . aPTT 06/10/2013 32   . Sodium 06/10/2013 138   . Potassium 06/10/2013 3.7   . Chloride 06/10/2013 98   . CO2 06/10/2013 31   . Glucose, Bld 06/10/2013 189*  . BUN 06/10/2013 18   . Creatinine, Ser 06/10/2013 1.01   . Calcium 06/10/2013 9.5   . GFR calc non Af Amer 06/10/2013 74*  . GFR calc Af Amer 06/10/2013 86*  .  WBC 06/10/2013 6.8   . RBC 06/10/2013 4.90   . Hemoglobin 06/10/2013 15.1   . HCT 06/10/2013 44.6   . MCV 06/10/2013 91.0   . [facility] 06/10/2013 30.8   . MCHC 06/10/2013 33.9   . RDW 06/10/2013 15.5   . Platelets 06/10/2013 179   . Prothrombin Time 06/10/2013 13.2   . INR 06/10/2013 1.02   . Color, Urine 06/10/2013 YELLOW   . APPearance 06/10/2013 CLEAR   . Specific Gravity, Urine 06/10/2013 1.024   . pH 06/10/2013 6.0   . Glucose, UA 06/10/2013 100*  . Hgb urine dipstick 06/10/2013 NEGATIVE   . Bilirubin Urine 06/10/2013 NEGATIVE   . Ketones, ur 06/10/2013 NEGATIVE   . Protein, ur 06/10/2013 NEGATIVE   .  Urobilinogen, UA 06/10/2013 1.0   . Nitrite 06/10/2013 NEGATIVE   . Leukocytes, UA 06/10/2013 NEGATIVE   . MRSA, PCR 06/10/2013 POSITIVE*  . Staphylococcus aureus 06/10/2013 POSITIVE*      Assessment:      68 y.o. male with planned surgery as above.   Known risk factors for perioperative complications: Diabetes mellitus   Difficulty with intubation is not anticipated.  Cardiac Risk Estimation: low  Current medications which may produce withdrawal symptoms if withheld perioperatively: atenolol      Plan:    1. Preoperative workup as follows ECG, hemoglobin, hematocrit, electrolytes, creatinine, glucose, coagulation studies. 2. Change in medication regimen before surgery: none, continue medication regimen including morning of surgery, with sip of water. 3. Prophylaxis for cardiac events with perioperative beta-blockers: already on beta blockers. 4. Invasive hemodynamic monitoring perioperatively: at the discretion of anesthesiologist. 5. Deep vein thrombosis prophylaxis postoperatively:regimen to be chosen by surgical team. 6. Surveillance for postoperative MI with ECG immediately postoperatively and on postoperative days 1 and 2 AND troponin levels 24 hours postoperatively and on day 4 or hospital discharge (whichever comes first): at the discretion of anesthesiologist. 7. Other measures: none

## 2013-06-15 NOTE — Progress Notes (Signed)
Pre visit review using our clinic review tool, if applicable. No additional management support is needed unless otherwise documented below in the visit note. 

## 2013-06-17 ENCOUNTER — Encounter: Payer: Self-pay | Admitting: Internal Medicine

## 2013-06-17 ENCOUNTER — Other Ambulatory Visit (HOSPITAL_BASED_OUTPATIENT_CLINIC_OR_DEPARTMENT_OTHER): Payer: Medicare Other

## 2013-06-17 ENCOUNTER — Ambulatory Visit (HOSPITAL_BASED_OUTPATIENT_CLINIC_OR_DEPARTMENT_OTHER): Payer: Medicare Other | Admitting: Internal Medicine

## 2013-06-17 VITALS — BP 144/68 | HR 66 | Temp 97.5°F | Resp 18 | Ht 71.0 in | Wt 342.2 lb

## 2013-06-17 DIAGNOSIS — D751 Secondary polycythemia: Secondary | ICD-10-CM

## 2013-06-17 LAB — CBC WITH DIFFERENTIAL/PLATELET
BASO%: 0.5 % (ref 0.0–2.0)
Basophils Absolute: 0 10*3/uL (ref 0.0–0.1)
EOS%: 5.2 % (ref 0.0–7.0)
Eosinophils Absolute: 0.3 10*3/uL (ref 0.0–0.5)
HCT: 43.5 % (ref 38.4–49.9)
HGB: 14.5 g/dL (ref 13.0–17.1)
LYMPH%: 16.3 % (ref 14.0–49.0)
MCH: 30.7 pg (ref 27.2–33.4)
MCHC: 33.3 g/dL (ref 32.0–36.0)
MCV: 92 fL (ref 79.3–98.0)
MONO#: 0.3 10*3/uL (ref 0.1–0.9)
MONO%: 5.4 % (ref 0.0–14.0)
NEUT#: 4.4 10*3/uL (ref 1.5–6.5)
NEUT%: 72.6 % (ref 39.0–75.0)
Platelets: 163 10*3/uL (ref 140–400)
RBC: 4.73 10*6/uL (ref 4.20–5.82)
RDW: 15.7 % — ABNORMAL HIGH (ref 11.0–14.6)
WBC: 6.1 10*3/uL (ref 4.0–10.3)
lymph#: 1 10*3/uL (ref 0.9–3.3)

## 2013-06-17 LAB — COMPREHENSIVE METABOLIC PANEL (CC13)
ALT: 18 U/L (ref 0–55)
AST: 19 U/L (ref 5–34)
Albumin: 3.2 g/dL — ABNORMAL LOW (ref 3.5–5.0)
Alkaline Phosphatase: 101 U/L (ref 40–150)
Anion Gap: 8 mEq/L (ref 3–11)
BUN: 13.8 mg/dL (ref 7.0–26.0)
CO2: 27 mEq/L (ref 22–29)
Calcium: 9.3 mg/dL (ref 8.4–10.4)
Chloride: 105 mEq/L (ref 98–109)
Creatinine: 1.1 mg/dL (ref 0.7–1.3)
Glucose: 184 mg/dl — ABNORMAL HIGH (ref 70–140)
Potassium: 3.5 mEq/L (ref 3.5–5.1)
Sodium: 139 mEq/L (ref 136–145)
Total Bilirubin: 1.54 mg/dL — ABNORMAL HIGH (ref 0.20–1.20)
Total Protein: 5.7 g/dL — ABNORMAL LOW (ref 6.4–8.3)

## 2013-06-17 NOTE — Progress Notes (Signed)
[facility] OFFICE PROGRESS NOTE  Annye [place]a, [place] [phone] [id number]  DIAGNOSIS: Erythrocytosis - Plan: CBC with Differential, Comprehensive metabolic panel (Cmet) - [place]  Chief Complaint  Patient presents with  . Erythrocytosis    CURRENT TREATMENT: Observation.   INTERVAL HISTORY: Jabir Dahlem 68 y.o. male with a history of erythrocytosis is here for follow-up.  He was last seen by Sharene Butters, PA on 05/14/2012.  He reports doing fine.  He is scheduled for for Left TKR on Monday, Jun 22, 2013 by Dr. Noralee Chars of [facility] orthopedic.  He had preoperative EKGs which were normal.  He visited Dr. Birdie Riddle on yesterday for preoperative clearance.  He continues to manage his psoriatic arthritis on Methotrexate (6mg  once weekly on Saturday) managed by his rheumatologist Dr. Ludger Nutting. He was last seen by his office 6 months ago.  His next colonoscopy is due in a few years with Dr. Thana Farr.  He denies bleeding or hematochezia or melena.  He does report a small anal fissure.    Mikale remains quite active with involvement in a number of volunteer capacities with a variety of organizations. 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  He was referred to Dr. Ralene Ok about three years ago for evaluation of his erythrocytosis prior to right total knee replacement that he underwent on 04/03/2010. Preliminary evaluation with JAK-2 mutation was negative.  He does have obstructive sleep apnea but did not sleep with oxygen. O2 sat in the office was initially 96%. Subsequently, Quita Skye underwent a nocturnal oximetry ordered by Dr. Gwenette Greet on 11/23/2010. There were 38 desaturation events of less than 3 minutes duration, during which the  mean high was 95.8% and the mean low was 91.1%. There was no time spent with a saturation level less than or equal to 88. These results would therefore make it seem unlikely that Joni's mild erythrocytosis is secondary to nocturnal desaturation. He does not smoke.    MEDICAL HISTORY: Past Medical History  Diagnosis Date  . Diabetes mellitus   . Hypertension   . OSA (obstructive sleep apnea)   . Arthritis   . Heart murmur   . Sleep apnea   . History of gout   . Psoriatic arthritis   . History of skin cancer   . Constipation   . Tinnitus     INTERIM HISTORY: has DM; OBSTRUCTIVE SLEEP APNEA; HYPERTENSION; Hyperlipidemia; BPH (benign prostatic hyperplasia); Gout; S/P TKR (total knee replacement); Obesity; Sinusitis, acute; Erythrocytosis; Diverticulitis; Cellulitis of eyelid; General medical exam; Psoriatic arthritis; Meniere disease; and Benign paroxysmal positional vertigo on his problem list.    ALLERGIES:  is allergic to lyrica; penicillins; and levofloxacin.  MEDICATIONS: has a current medication list which includes the following prescription(s): allopurinol, aspirin, atenolol, atorvastatin, vitamin d, folic acid, methotrexate, naproxen sodium, pioglitazone, potassium chloride, ramipril, and triamterene-hydrochlorothiazide.  SURGICAL HISTORY:  Past Surgical History  Procedure Laterality Date  . Addenoids    . Tonsillectomy    . Joint replacement  2012    RT KNEE  . Lung biopsy      20 YRS AGO   PROBLEM LIST:  1. 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 JAK2 exon 12 region mutation analysis was negative on 06/22/11.  2. Hypertension.  3. Dyslipidemia.  4. Diabetes mellitus type 2.  5. 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  6. Morbid obesity.  7. Obstructive sleep apnea treated with CPAP for the past 10 years.  8. BPH.  9.  Osteoarthritis involving the knees, status post right total knee replacement on 04/03/2010.He also had scar tissue repair in mid March 2014 on the same knee without any complications.  10.Bilateral tinnitus with onset approximately 2008.  11.Vertigo.  12.New diagnosis of Psoriatic Arthritis, on Methotrexate 2.5 mg, 6 po q week   REVIEW OF SYSTEMS:   Constitutional: Denies fevers,  chills or abnormal weight loss Eyes: Denies blurriness of vision Ears, nose, mouth, throat, and face: Denies mucositis or sore throat Respiratory: Denies cough, dyspnea or wheezes Cardiovascular: Denies palpitation, chest discomfort or lower extremity swelling Gastrointestinal:  Denies nausea, heartburn or change in bowel habits Skin: Denies abnormal skin rashes Lymphatics: Denies new lymphadenopathy or easy bruising Neurological:Denies numbness, tingling or new weaknesses Behavioral/Psych: Mood is stable, no new changes  All other systems were reviewed with the patient and are negative.  PHYSICAL EXAMINATION: ECOG PERFORMANCE STATUS: 0 - Asymptomatic  Blood pressure 144/68, pulse 66, temperature 97.5 F (36.4 C), temperature source Oral, resp. rate 18, height 5\' 11"  (1.803 m), weight 342 lb 3.2 oz (155.221 kg), SpO2 96.00%.  GENERAL:alert, no distress and comfortable; morbidly obese, ambulates with a cane.  SKIN: skin color, texture, turgor are normal, no rashes or significant lesions EYES: normal, Conjunctiva are pink and non-injected, sclera clear OROPHARYNX:no exudate, no erythema and lips, buccal mucosa, and tongue normal  NECK: supple, thyroid normal size, non-tender, without nodularity LYMPH:  no palpable lymphadenopathy in the cervical, axillary or supraclavicular LUNGS: clear to auscultation with normal breathing effort, no wheezes or rhonchi HEART: regular rate & rhythm and no murmurs and trace lower extremity edema ABDOMEN:abdomen soft, non-tender and normal bowel sounds Musculoskeletal:no cyanosis of digits and no clubbing  NEURO: alert & oriented x 3 with fluent speech, no focal motor/sensory deficits  Labs:  Lab Results  Component Value Date   WBC 6.1 06/17/2013   HGB 14.5 06/17/2013   HCT 43.5 06/17/2013   MCV 92.0 06/17/2013   PLT 163 06/17/2013   NEUTROABS 4.4 06/17/2013      Chemistry      Component Value Date/Time   NA 138 06/10/2013 1000   NA 140 05/14/2012 0856   K  3.7 06/10/2013 1000   K 3.3* 05/14/2012 0856   CL 98 06/10/2013 1000   CL 102 05/14/2012 0856   CO2 31 06/10/2013 1000   CO2 28 05/14/2012 0856   BUN 18 06/10/2013 1000   BUN 14.0 05/14/2012 0856   CREATININE 1.01 06/10/2013 1000   CREATININE 1.0 05/14/2012 0856      Component Value Date/Time   CALCIUM 9.5 06/10/2013 1000   CALCIUM 9.2 05/14/2012 0856   ALKPHOS 96 05/14/2012 0856   ALKPHOS 60 06/20/2011 1432   AST 20 05/14/2012 0856   AST 26 06/20/2011 1432   ALT 21 05/14/2012 0856   ALT 16 06/20/2011 1432   BILITOT 1.60* 05/14/2012 0856   BILITOT 1.8* 06/20/2011 1432      CBC:  Recent Labs Lab 06/17/13 0947  WBC 6.1  NEUTROABS 4.4  HGB 14.5  HCT 43.5  MCV 92.0  PLT 163    RADIOGRAPHIC STUDIES: Dg Chest 2 View  06/10/2013   CLINICAL DATA:  PREOP L TOTAL KNEE / HX HBP  EXAM: CHEST  2 VIEW  COMPARISON:  DG CHEST 2 VIEW dated 07/17/2011  FINDINGS: Low lung volumes. Cardiac silhouette is enlarged. There no focal regions of consolidation or focal infiltrates. No acute osseous abnormalities.  IMPRESSION: No active cardiopulmonary disease, stable cardiomegaly.   Electronically Signed  By: Margaree Mackintosh M.D.   On: 06/10/2013 13:25   US Abdomen limited 05/19/2012 RADIOLOGY REPORT*  Clinical Data: Erythrocytosis. Evaluate for splenomegaly.  LIMITED ABDOMINAL ULTRASOUND  Comparison: Prior limited examination 06/05/2011.  Findings: Current splenic measurements are 13.0 x 6.9 x 12.0 cm for  an estimated volume of 540 ml. This is similar to the prior  examination. No focal abnormalities are identified.  IMPRESSION:  Stable mild splenomegaly.   ASSESSMENT: Nickey Kloepfer 68 y.o. male with a history of Erythrocytosis - Plan: CBC with Differential, Comprehensive metabolic panel (Cmet) - [facility]   PLAN:  1. Erythrocytosis.  -Again, our clinical impression is that Mychael most likely has mild erythrocytosis that is either a normal variant or perhaps secondary to something other than the polycythemia vera. CXR and ultrasound  of the abdomen is as noted above.  He has mild splenomegaly.   2. Follow up.  --We will  plan to see Mat again in 1 year, at which time we will check CBC and chemistries.  All questions were answered. The patient knows to call the clinic with any problems, questions or concerns. We can certainly see the patient much sooner if necessary.  I spent 10 minutes counseling the patient face to face. The total time spent in the appointment was 15 minutes.    Concha Norway, MD 06/17/2013 10:45 AM

## 2013-06-21 ENCOUNTER — Other Ambulatory Visit: Payer: Self-pay | Admitting: Family Medicine

## 2013-06-21 NOTE — H&P (Signed)
TOTAL KNEE ADMISSION H&P  Patient is being admitted for left total knee arthroplasty and right knee soft tissue exploration, saphenous neurectomy.  Subjective:  Chief Complaint:      Bilateral knee pain, left knee OA / pain and right knee persistent medial knee pain / neuroma.  HPI: Gary Butler, 68 y.o. male, has a history of pain and functional disability in the bilateral knee due to arthritis and has failed non-surgical conservative treatments for greater than 12 weeks to include NSAID's and/or analgesics, corticosteriod injections, viscosupplementation injections, supervised PT with diminished ADL's post treatment, use of assistive devices and activity modification.  Onset of symptoms was gradual, starting 5-6 years ago with gradually worsening course since that time in the left knee. The patient noted prior procedures on the knee to include  arthroplasty on the right knee done 3 years ago in [facility].  Patient currently rates pain in the left and right knee(s) at 6 out of 10 with activity. Patient has worsening of pain with activity and weight bearing, pain that interferes with activities of daily living, pain with passive range of motion, crepitus and joint swelling.  Patient has evidence of periarticular osteophytes and joint space narrowing by imaging studies.  There is no active infection.  Risks, benefits and expectations were discussed with the patient.  Risks including but not limited to the risk of anesthesia, blood clots, nerve damage, blood vessel damage, failure of the prosthesis, infection and up to and including death.  Patient understand the risks, benefits and expectations and wishes to proceed with surgery.   D/C Plans:     SNF   [facility] Flock [facility])  Post-op Meds:    No Rx given   Tranexamic Acid:   To be given  Decadron:      Not to be given  FYI:    ASA post-op  Norco post-op   Patient Active Problem List   Diagnosis Date Noted  . Meniere disease 04/21/2013  . Benign  paroxysmal positional vertigo 04/21/2013  . Psoriatic arthritis 05/14/2012  . General medical exam 01/28/2012  . Cellulitis of eyelid 09/25/2011  . Diverticulitis 06/20/2011  . Erythrocytosis 05/18/2011  . Sinusitis, acute 03/14/2011  . S/P TKR (total knee replacement) 12/31/2010  . Obesity 12/31/2010  . Hyperlipidemia 12/22/2010  . BPH (benign prostatic hyperplasia) 12/22/2010  . Gout 12/22/2010  . DM 04/04/2009  . OBSTRUCTIVE SLEEP APNEA 04/04/2009  . HYPERTENSION 04/04/2009   Past Medical History  Diagnosis Date  . Diabetes mellitus   . Hypertension   . OSA (obstructive sleep apnea)   . Arthritis   . Heart murmur   . Sleep apnea   . History of gout   . Psoriatic arthritis   . History of skin cancer   . Constipation   . Tinnitus     Past Surgical History  Procedure Laterality Date  . Addenoids    . Tonsillectomy    . Joint replacement  2012    RT KNEE  . Lung biopsy      20 YRS AGO    No prescriptions prior to admission   Allergies  Allergen Reactions  . Lyrica [Pregabalin] Swelling  . Penicillins Swelling  . Levofloxacin Rash    History  Substance Use Topics  . Smoking status: Never Smoker   . Smokeless tobacco: Not on file  . Alcohol Use: Yes     Comment: rare    Family History  Problem Relation Age of Onset  . Emphysema Father   .  Clotting disorder Father   . Arthritis Father   . Heart disease Mother   . Arthritis Mother   . Diabetes Maternal Aunt   . Diabetes Paternal Aunt   . Arthritis Maternal Grandmother   . Hypertension Maternal Grandmother   . Diabetes Maternal Grandmother   . Arthritis Paternal Grandmother      Review of Systems  Constitutional: Negative.   HENT: Positive for tinnitus.   Eyes: Negative.   Respiratory: Negative.   Cardiovascular: Negative.   Gastrointestinal: Positive for constipation.  Genitourinary: Positive for frequency.  Musculoskeletal: Positive for back pain, joint pain and myalgias.  Skin: Negative.    Neurological: Negative.   Endo/Heme/Allergies: Negative.   Psychiatric/Behavioral: Negative.     Objective:  Physical Exam  Constitutional: He is oriented to person, place, and time. He appears well-developed and well-nourished.  HENT:  Head: Normocephalic and atraumatic.  Mouth/Throat: Oropharynx is clear and moist.  Eyes: Pupils are equal, round, and reactive to light.  Neck: Neck supple. No JVD present. No tracheal deviation present. No thyromegaly present.  Cardiovascular: Normal rate, regular rhythm and intact distal pulses.   Murmur heard. Respiratory: Effort normal and breath sounds normal. No stridor. No respiratory distress. He has no wheezes.  GI: Soft. There is no tenderness. There is no guarding.  Musculoskeletal:       Right knee: He exhibits decreased range of motion and laceration (healed). He exhibits no effusion, no ecchymosis, no deformity and no erythema. Tenderness found.       Left knee: He exhibits decreased range of motion, swelling and bony tenderness. He exhibits no ecchymosis, no deformity, no laceration and no erythema. Tenderness found.  Lymphadenopathy:    He has no cervical adenopathy.  Neurological: He is alert and oriented to person, place, and time.  Skin: Skin is warm and dry.  Psychiatric: He has a normal mood and affect.     Labs:  Estimated body mass index is 49.60 kg/(m^2) as calculated from the following:   Height as of 09/01/12: 5' 9.75" (1.772 m).   Weight as of 04/21/13: 155.754 kg (343 lb 6 oz).   Imaging Review Plain radiographs demonstrate severe degenerative joint disease of the left knee(s). The overall alignment is neutral. The bone quality appears to be good for age and reported activity level.  The right knee demonstartes previous total knee arthroplasty.  Assessment/Plan:  End stage arthritis, left knee and right knee persistent medial knee pain / neuroma.  The patient history, physical examination, clinical judgment of the  provider and imaging studies are consistent with end stage degenerative joint disease of the left knee(s) and total knee arthroplasty is deemed medically necessary and well as right knee soft tissue exploration, saphenous neurectomy. . The treatment options including medical management, injection therapy arthroscopy and arthroplasty were discussed at length. The risks and benefits of total knee arthroplasty were presented and reviewed. The risks due to aseptic loosening, infection, stiffness, patella tracking problems, thromboembolic complications and other imponderables were discussed. The patient acknowledged the explanation, agreed to proceed with the plan and consent was signed. Patient is being admitted for inpatient treatment for surgery, pain control, PT, OT, prophylactic antibiotics, VTE prophylaxis, progressive ambulation and ADL's and discharge planning. The patient is planning to be discharged to skilled nursing facility.     West Pugh Bashar Milam   PAC  06/21/2013, 7:15 PM

## 2013-06-22 ENCOUNTER — Inpatient Hospital Stay (HOSPITAL_COMMUNITY)
Admission: RE | Admit: 2013-06-22 | Discharge: 2013-06-25 | DRG: 470 | Disposition: A | Discharge status: Skilled Nursing Facility | Payer: Medicare Other | Source: Ambulatory Visit | Attending: Orthopedic Surgery | Admitting: Orthopedic Surgery

## 2013-06-22 ENCOUNTER — Telehealth: Payer: Self-pay | Admitting: Internal Medicine

## 2013-06-22 ENCOUNTER — Encounter (HOSPITAL_COMMUNITY)
Admission: RE | Disposition: A | Discharge status: Skilled Nursing Facility | Payer: Self-pay | Source: Ambulatory Visit | Attending: Orthopedic Surgery

## 2013-06-22 ENCOUNTER — Encounter (HOSPITAL_COMMUNITY): Payer: Medicare Other | Admitting: Certified Registered Nurse Anesthetist

## 2013-06-22 ENCOUNTER — Inpatient Hospital Stay (HOSPITAL_COMMUNITY): Payer: Medicare Other | Admitting: Certified Registered Nurse Anesthetist

## 2013-06-22 ENCOUNTER — Encounter (HOSPITAL_COMMUNITY): Payer: Self-pay | Admitting: *Deleted

## 2013-06-22 DIAGNOSIS — H9319 Tinnitus, unspecified ear: Secondary | ICD-10-CM | POA: Diagnosis present

## 2013-06-22 DIAGNOSIS — D2120 Benign neoplasm of connective and other soft tissue of unspecified lower limb, including hip: Secondary | ICD-10-CM | POA: Diagnosis present

## 2013-06-22 DIAGNOSIS — Z881 Allergy status to other antibiotic agents status: Secondary | ICD-10-CM

## 2013-06-22 DIAGNOSIS — Z888 Allergy status to other drugs, medicaments and biological substances status: Secondary | ICD-10-CM

## 2013-06-22 DIAGNOSIS — L4050 Arthropathic psoriasis, unspecified: Secondary | ICD-10-CM | POA: Diagnosis present

## 2013-06-22 DIAGNOSIS — R011 Cardiac murmur, unspecified: Secondary | ICD-10-CM | POA: Diagnosis present

## 2013-06-22 DIAGNOSIS — E785 Hyperlipidemia, unspecified: Secondary | ICD-10-CM | POA: Diagnosis present

## 2013-06-22 DIAGNOSIS — H8109 Meniere's disease, unspecified ear: Secondary | ICD-10-CM | POA: Diagnosis present

## 2013-06-22 DIAGNOSIS — M109 Gout, unspecified: Secondary | ICD-10-CM | POA: Diagnosis present

## 2013-06-22 DIAGNOSIS — I10 Essential (primary) hypertension: Secondary | ICD-10-CM | POA: Diagnosis present

## 2013-06-22 DIAGNOSIS — Z6841 Body Mass Index (BMI) 40.0 and over, adult: Secondary | ICD-10-CM

## 2013-06-22 DIAGNOSIS — Z880 Allergy status to penicillin: Secondary | ICD-10-CM

## 2013-06-22 DIAGNOSIS — E119 Type 2 diabetes mellitus without complications: Secondary | ICD-10-CM | POA: Diagnosis present

## 2013-06-22 DIAGNOSIS — Z85828 Personal history of other malignant neoplasm of skin: Secondary | ICD-10-CM

## 2013-06-22 DIAGNOSIS — Z833 Family history of diabetes mellitus: Secondary | ICD-10-CM

## 2013-06-22 DIAGNOSIS — G4733 Obstructive sleep apnea (adult) (pediatric): Secondary | ICD-10-CM | POA: Diagnosis present

## 2013-06-22 DIAGNOSIS — Z96652 Presence of left artificial knee joint: Secondary | ICD-10-CM

## 2013-06-22 DIAGNOSIS — Z8249 Family history of ischemic heart disease and other diseases of the circulatory system: Secondary | ICD-10-CM

## 2013-06-22 DIAGNOSIS — M1710 Unilateral primary osteoarthritis, unspecified knee: Principal | ICD-10-CM | POA: Diagnosis present

## 2013-06-22 DIAGNOSIS — K5900 Constipation, unspecified: Secondary | ICD-10-CM | POA: Diagnosis present

## 2013-06-22 HISTORY — PX: TOTAL KNEE ARTHROPLASTY: SHX125

## 2013-06-22 LAB — ABO/RH: ABO/RH(D): O POS

## 2013-06-22 LAB — GLUCOSE, CAPILLARY
Glucose-Capillary: 125 mg/dL — ABNORMAL HIGH (ref 70–99)
Glucose-Capillary: 127 mg/dL — ABNORMAL HIGH (ref 70–99)
Glucose-Capillary: 137 mg/dL — ABNORMAL HIGH (ref 70–99)
Glucose-Capillary: 147 mg/dL — ABNORMAL HIGH (ref 70–99)
Glucose-Capillary: 137 mg/dL — ABNORMAL HIGH (ref 70–99)

## 2013-06-22 LAB — TYPE AND SCREEN
ABO/RH(D): O POS
Antibody Screen: NEGATIVE

## 2013-06-22 SURGERY — ARTHROPLASTY, KNEE, TOTAL
Anesthesia: General | Site: Knee | Laterality: Bilateral

## 2013-06-22 MED ORDER — RAMIPRIL 5 MG PO CAPS
5.0000 mg | ORAL_CAPSULE | Freq: Every day | ORAL | Status: DC
  Administered 2013-06-23 – 2013-06-25 (×3): 5 mg via ORAL
  Filled 2013-06-22 (×4): qty 1

## 2013-06-22 MED ORDER — MIDAZOLAM HCL 2 MG/2ML IJ SOLN
INTRAMUSCULAR | Status: AC
  Filled 2013-06-22: qty 2

## 2013-06-22 MED ORDER — BUPIVACAINE-EPINEPHRINE (PF) 0.25% -1:200000 IJ SOLN
INTRAMUSCULAR | Status: AC
  Filled 2013-06-22: qty 30

## 2013-06-22 MED ORDER — SODIUM CHLORIDE 0.9 % IJ SOLN
INTRAMUSCULAR | Status: AC
  Filled 2013-06-22: qty 50

## 2013-06-22 MED ORDER — DEXAMETHASONE SODIUM PHOSPHATE 10 MG/ML IJ SOLN
10.0000 mg | Freq: Once | INTRAMUSCULAR | Status: AC
  Administered 2013-06-23: 10 mg via INTRAVENOUS
  Filled 2013-06-22 (×2): qty 1

## 2013-06-22 MED ORDER — BUPIVACAINE LIPOSOME 1.3 % IJ SUSP
20.0000 mL | Freq: Once | INTRAMUSCULAR | Status: AC
Start: 2013-06-22 — End: 2013-06-22
  Administered 2013-06-22: 20 mL
  Filled 2013-06-22: qty 20

## 2013-06-22 MED ORDER — POTASSIUM CHLORIDE 2 MEQ/ML IV SOLN
INTRAVENOUS | Status: DC
  Administered 2013-06-22 – 2013-06-23 (×2): via INTRAVENOUS
  Filled 2013-06-22 (×7): qty 1000

## 2013-06-22 MED ORDER — HYDROMORPHONE HCL PF 1 MG/ML IJ SOLN
INTRAMUSCULAR | Status: DC | PRN
  Administered 2013-06-22: 0.5 mg via INTRAVENOUS
  Administered 2013-06-22: 1 mg via INTRAVENOUS

## 2013-06-22 MED ORDER — POTASSIUM CHLORIDE CRYS ER 10 MEQ PO TBCR
10.0000 meq | EXTENDED_RELEASE_TABLET | Freq: Every day | ORAL | Status: DC
  Administered 2013-06-22 – 2013-06-25 (×4): 10 meq via ORAL
  Filled 2013-06-22 (×5): qty 1

## 2013-06-22 MED ORDER — POLYETHYLENE GLYCOL 3350 17 G PO PACK
17.0000 g | PACK | Freq: Two times a day (BID) | ORAL | Status: DC
  Administered 2013-06-22 – 2013-06-24 (×5): 17 g via ORAL

## 2013-06-22 MED ORDER — CLINDAMYCIN PHOSPHATE 900 MG/50ML IV SOLN
INTRAVENOUS | Status: AC
  Filled 2013-06-22: qty 50

## 2013-06-22 MED ORDER — ATENOLOL 50 MG PO TABS
50.0000 mg | ORAL_TABLET | Freq: Every morning | ORAL | Status: DC
  Administered 2013-06-23 – 2013-06-25 (×3): 50 mg via ORAL
  Filled 2013-06-22 (×3): qty 1

## 2013-06-22 MED ORDER — MEPERIDINE HCL 50 MG/ML IJ SOLN: 6.2500 mg | INTRAMUSCULAR | Status: DC | PRN

## 2013-06-22 MED ORDER — HYDROCODONE-ACETAMINOPHEN 7.5-325 MG PO TABS
1.0000 | ORAL_TABLET | Freq: Four times a day (QID) | ORAL | Status: DC
  Administered 2013-06-22 – 2013-06-23 (×3): 1 via ORAL
  Filled 2013-06-22 (×3): qty 1

## 2013-06-22 MED ORDER — PROPOFOL 10 MG/ML IV BOLUS
INTRAVENOUS | Status: DC | PRN
  Administered 2013-06-22: 20 mg via INTRAVENOUS
  Administered 2013-06-22: 250 mg via INTRAVENOUS
  Administered 2013-06-22: 20 mg via INTRAVENOUS

## 2013-06-22 MED ORDER — CLINDAMYCIN PHOSPHATE 600 MG/50ML IV SOLN
600.0000 mg | Freq: Four times a day (QID) | INTRAVENOUS | Status: AC
  Administered 2013-06-22 (×2): 600 mg via INTRAVENOUS
  Filled 2013-06-22 (×2): qty 50

## 2013-06-22 MED ORDER — HYDROMORPHONE HCL PF 1 MG/ML IJ SOLN
INTRAMUSCULAR | Status: AC
  Filled 2013-06-22: qty 1

## 2013-06-22 MED ORDER — METOCLOPRAMIDE HCL 10 MG PO TABS: 5.0000 mg | ORAL_TABLET | Freq: Three times a day (TID) | ORAL | Status: DC | PRN

## 2013-06-22 MED ORDER — NEOSTIGMINE METHYLSULFATE 10 MG/10ML IV SOLN
INTRAVENOUS | Status: DC | PRN
  Administered 2013-06-22: 5 mg via INTRAVENOUS

## 2013-06-22 MED ORDER — ALLOPURINOL 300 MG PO TABS
300.0000 mg | ORAL_TABLET | Freq: Every day | ORAL | Status: DC
  Administered 2013-06-23 – 2013-06-25 (×3): 300 mg via ORAL
  Filled 2013-06-22 (×3): qty 1

## 2013-06-22 MED ORDER — ONDANSETRON HCL 4 MG/2ML IJ SOLN
INTRAMUSCULAR | Status: DC | PRN
  Administered 2013-06-22: 4 mg via INTRAVENOUS

## 2013-06-22 MED ORDER — GLYCOPYRROLATE 0.2 MG/ML IJ SOLN
INTRAMUSCULAR | Status: DC | PRN
  Administered 2013-06-22: .8 mg via INTRAVENOUS

## 2013-06-22 MED ORDER — ONDANSETRON HCL 4 MG/2ML IJ SOLN
INTRAMUSCULAR | Status: AC
  Filled 2013-06-22: qty 2

## 2013-06-22 MED ORDER — FENTANYL CITRATE 0.05 MG/ML IJ SOLN
INTRAMUSCULAR | Status: AC
  Filled 2013-06-22: qty 2

## 2013-06-22 MED ORDER — METHOCARBAMOL 1000 MG/10ML IJ SOLN
500.0000 mg | Freq: Four times a day (QID) | INTRAVENOUS | Status: DC | PRN
  Administered 2013-06-22: 500 mg via INTRAVENOUS
  Filled 2013-06-22: qty 5

## 2013-06-22 MED ORDER — VITAMIN D 50 MCG (2000 UT) PO TABS: 2000.0000 [IU] | ORAL_TABLET | Freq: Every day | ORAL | Status: DC

## 2013-06-22 MED ORDER — MENTHOL 3 MG MT LOZG: 1.0000 | LOZENGE | OROMUCOSAL | Status: DC | PRN

## 2013-06-22 MED ORDER — CLINDAMYCIN PHOSPHATE 900 MG/50ML IV SOLN
900.0000 mg | INTRAVENOUS | Status: AC
  Administered 2013-06-22: 900 mg via INTRAVENOUS

## 2013-06-22 MED ORDER — KETOROLAC TROMETHAMINE 30 MG/ML IJ SOLN
INTRAMUSCULAR | Status: AC
  Filled 2013-06-22: qty 1

## 2013-06-22 MED ORDER — ACETAMINOPHEN 325 MG PO TABS: 650.0000 mg | ORAL_TABLET | Freq: Four times a day (QID) | ORAL | Status: DC | PRN

## 2013-06-22 MED ORDER — RIVAROXABAN 10 MG PO TABS
10.0000 mg | ORAL_TABLET | Freq: Every day | ORAL | Status: DC
  Administered 2013-06-23 – 2013-06-25 (×3): 10 mg via ORAL
  Filled 2013-06-22 (×5): qty 1

## 2013-06-22 MED ORDER — PHENOL 1.4 % MT LIQD: 1.0000 | OROMUCOSAL | Status: DC | PRN

## 2013-06-22 MED ORDER — FOLIC ACID 1 MG PO TABS
1.0000 mg | ORAL_TABLET | Freq: Every day | ORAL | Status: DC
  Administered 2013-06-22 – 2013-06-25 (×4): 1 mg via ORAL
  Filled 2013-06-22 (×5): qty 1

## 2013-06-22 MED ORDER — METOCLOPRAMIDE HCL 5 MG/ML IJ SOLN: 5.0000 mg | Freq: Three times a day (TID) | INTRAMUSCULAR | Status: DC | PRN

## 2013-06-22 MED ORDER — DOCUSATE SODIUM 100 MG PO CAPS
100.0000 mg | ORAL_CAPSULE | Freq: Two times a day (BID) | ORAL | Status: DC
  Administered 2013-06-22 – 2013-06-25 (×6): 100 mg via ORAL

## 2013-06-22 MED ORDER — BUPIVACAINE-EPINEPHRINE (PF) 0.25% -1:200000 IJ SOLN
INTRAMUSCULAR | Status: DC | PRN
  Administered 2013-06-22: 30 mL

## 2013-06-22 MED ORDER — GLYCOPYRROLATE 0.2 MG/ML IJ SOLN
INTRAMUSCULAR | Status: AC
  Filled 2013-06-22: qty 4

## 2013-06-22 MED ORDER — PROPOFOL 10 MG/ML IV BOLUS
INTRAVENOUS | Status: AC
  Filled 2013-06-22: qty 40

## 2013-06-22 MED ORDER — LACTATED RINGERS IV SOLN: INTRAVENOUS | Status: DC

## 2013-06-22 MED ORDER — ATORVASTATIN CALCIUM 40 MG PO TABS
40.0000 mg | ORAL_TABLET | Freq: Every evening | ORAL | Status: DC
  Administered 2013-06-22 – 2013-06-24 (×3): 40 mg via ORAL
  Filled 2013-06-22 (×5): qty 1

## 2013-06-22 MED ORDER — FENTANYL CITRATE 0.05 MG/ML IJ SOLN
INTRAMUSCULAR | Status: DC | PRN
Start: 2013-06-22 — End: 2013-06-22
  Administered 2013-06-22: 50 ug via INTRAVENOUS
  Administered 2013-06-22: 100 ug via INTRAVENOUS
  Administered 2013-06-22: 50 ug via INTRAVENOUS

## 2013-06-22 MED ORDER — LACTATED RINGERS IV SOLN
INTRAVENOUS | Status: DC | PRN
  Administered 2013-06-22 (×2): via INTRAVENOUS

## 2013-06-22 MED ORDER — HYDROMORPHONE HCL PF 1 MG/ML IJ SOLN
0.5000 mg | INTRAMUSCULAR | Status: DC | PRN
  Administered 2013-06-22: 0.5 mg via INTRAVENOUS
  Administered 2013-06-23: 1 mg via INTRAVENOUS
  Filled 2013-06-22 (×2): qty 1

## 2013-06-22 MED ORDER — LIDOCAINE HCL (CARDIAC) 20 MG/ML IV SOLN
INTRAVENOUS | Status: DC | PRN
  Administered 2013-06-22: 100 mg via INTRAVENOUS

## 2013-06-22 MED ORDER — INSULIN ASPART 100 UNIT/ML ~~LOC~~ SOLN
0.0000 [IU] | Freq: Three times a day (TID) | SUBCUTANEOUS | Status: DC
  Administered 2013-06-23: 3 [IU] via SUBCUTANEOUS

## 2013-06-22 MED ORDER — PIOGLITAZONE HCL 30 MG PO TABS
30.0000 mg | ORAL_TABLET | Freq: Every day | ORAL | Status: DC
  Administered 2013-06-23 – 2013-06-25 (×3): 30 mg via ORAL
  Filled 2013-06-22 (×3): qty 1

## 2013-06-22 MED ORDER — NEOSTIGMINE METHYLSULFATE 10 MG/10ML IV SOLN
INTRAVENOUS | Status: AC
Start: 2013-06-22 — End: 2013-06-22
  Filled 2013-06-22: qty 1

## 2013-06-22 MED ORDER — METHOCARBAMOL 500 MG PO TABS
500.0000 mg | ORAL_TABLET | Freq: Four times a day (QID) | ORAL | Status: DC | PRN
  Administered 2013-06-23 – 2013-06-24 (×3): 500 mg via ORAL
  Filled 2013-06-22 (×3): qty 1

## 2013-06-22 MED ORDER — SODIUM CHLORIDE 0.9 % IV SOLN
1000.0000 mg | Freq: Once | INTRAVENOUS | Status: AC
  Administered 2013-06-22: 1000 mg via INTRAVENOUS
  Filled 2013-06-22: qty 10

## 2013-06-22 MED ORDER — ONDANSETRON HCL 4 MG PO TABS: 4.0000 mg | ORAL_TABLET | Freq: Four times a day (QID) | ORAL | Status: DC | PRN

## 2013-06-22 MED ORDER — OXYCODONE HCL 5 MG/5ML PO SOLN
5.0000 mg | Freq: Once | ORAL | Status: DC | PRN
  Filled 2013-06-22: qty 5

## 2013-06-22 MED ORDER — FERROUS SULFATE 325 (65 FE) MG PO TABS
325.0000 mg | ORAL_TABLET | Freq: Two times a day (BID) | ORAL | Status: DC
  Administered 2013-06-23 – 2013-06-25 (×5): 325 mg via ORAL
  Filled 2013-06-22 (×9): qty 1

## 2013-06-22 MED ORDER — ONDANSETRON HCL 4 MG/2ML IJ SOLN: 4.0000 mg | Freq: Four times a day (QID) | INTRAMUSCULAR | Status: DC | PRN

## 2013-06-22 MED ORDER — ROCURONIUM BROMIDE 100 MG/10ML IV SOLN
INTRAVENOUS | Status: DC | PRN
  Administered 2013-06-22: 50 mg via INTRAVENOUS

## 2013-06-22 MED ORDER — MAGNESIUM CITRATE PO SOLN: 1.0000 | Freq: Once | ORAL | Status: AC | PRN

## 2013-06-22 MED ORDER — SUCCINYLCHOLINE CHLORIDE 20 MG/ML IJ SOLN
INTRAMUSCULAR | Status: DC | PRN
  Administered 2013-06-22: 160 mg via INTRAVENOUS

## 2013-06-22 MED ORDER — PROMETHAZINE HCL 25 MG/ML IJ SOLN: 6.2500 mg | INTRAMUSCULAR | Status: DC | PRN

## 2013-06-22 MED ORDER — SODIUM CHLORIDE 0.9 % IJ SOLN
INTRAMUSCULAR | Status: DC | PRN
  Administered 2013-06-22: 9 mL

## 2013-06-22 MED ORDER — HYDROMORPHONE HCL PF 1 MG/ML IJ SOLN
0.2500 mg | INTRAMUSCULAR | Status: DC | PRN
  Administered 2013-06-22 (×3): 0.5 mg via INTRAVENOUS

## 2013-06-22 MED ORDER — FUROSEMIDE 10 MG/ML IJ SOLN
INTRAMUSCULAR | Status: AC
  Filled 2013-06-22: qty 2

## 2013-06-22 MED ORDER — ROCURONIUM BROMIDE 100 MG/10ML IV SOLN
INTRAVENOUS | Status: AC
  Filled 2013-06-22: qty 1

## 2013-06-22 MED ORDER — MIDAZOLAM HCL 5 MG/5ML IJ SOLN
INTRAMUSCULAR | Status: DC | PRN
  Administered 2013-06-22: 2 mg via INTRAVENOUS

## 2013-06-22 MED ORDER — HYDROMORPHONE HCL PF 2 MG/ML IJ SOLN
INTRAMUSCULAR | Status: AC
  Filled 2013-06-22: qty 1

## 2013-06-22 MED ORDER — KETOROLAC TROMETHAMINE 30 MG/ML IJ SOLN
INTRAMUSCULAR | Status: DC | PRN
  Administered 2013-06-22: 30 mg

## 2013-06-22 MED ORDER — ALUM & MAG HYDROXIDE-SIMETH 200-200-20 MG/5ML PO SUSP: 30.0000 mL | ORAL | Status: DC | PRN

## 2013-06-22 MED ORDER — TRIAMTERENE-HCTZ 75-50 MG PO TABS
0.5000 | ORAL_TABLET | Freq: Every morning | ORAL | Status: DC
  Administered 2013-06-23: 0.5 via ORAL
  Filled 2013-06-22: qty 0.5

## 2013-06-22 MED ORDER — ACETAMINOPHEN 10 MG/ML IV SOLN
1000.0000 mg | Freq: Once | INTRAVENOUS | Status: AC
  Administered 2013-06-22: 1000 mg via INTRAVENOUS
  Filled 2013-06-22: qty 100

## 2013-06-22 MED ORDER — CHLORHEXIDINE GLUCONATE 4 % EX LIQD: 60.0000 mL | Freq: Once | CUTANEOUS | Status: DC

## 2013-06-22 MED ORDER — ACETAMINOPHEN 650 MG RE SUPP: 650.0000 mg | Freq: Four times a day (QID) | RECTAL | Status: DC | PRN

## 2013-06-22 MED ORDER — OXYCODONE HCL 5 MG PO TABS: 5.0000 mg | ORAL_TABLET | Freq: Once | ORAL | Status: DC | PRN

## 2013-06-22 MED ORDER — VITAMIN D3 25 MCG (1000 UNIT) PO TABS
2000.0000 [IU] | ORAL_TABLET | Freq: Every day | ORAL | Status: DC
  Administered 2013-06-23 – 2013-06-25 (×3): 2000 [IU] via ORAL
  Filled 2013-06-22 (×3): qty 2

## 2013-06-22 MED ORDER — FUROSEMIDE 10 MG/ML IJ SOLN
INTRAMUSCULAR | Status: DC | PRN
  Administered 2013-06-22: 10 mg via INTRAMUSCULAR

## 2013-06-22 SURGICAL SUPPLY — 74 items
ADH SKN CLS APL DERMABOND .7 (GAUZE/BANDAGES/DRESSINGS) ×2
BAG SPEC THK2 15X12 ZIP CLS (MISCELLANEOUS) ×1
BAG ZIPLOCK 12X15 (MISCELLANEOUS) ×2 IMPLANT
BANDAGE ELASTIC 6 VELCRO ST LF (GAUZE/BANDAGES/DRESSINGS) ×4 IMPLANT
BANDAGE ESMARK 6X9 LF (GAUZE/BANDAGES/DRESSINGS) ×1 IMPLANT
BLADE 10 SAFETY STRL DISP (BLADE) ×2 IMPLANT
BLADE SAW SGTL 13.0X1.19X90.0M (BLADE) ×2 IMPLANT
BNDG CMPR 9X6 STRL LF SNTH (GAUZE/BANDAGES/DRESSINGS) ×3
BNDG COHESIVE 6X5 TAN STRL LF (GAUZE/BANDAGES/DRESSINGS) ×1 IMPLANT
BNDG ESMARK 6X9 LF (GAUZE/BANDAGES/DRESSINGS) ×6
BONE CEMENT GENTAMICIN (Cement) ×4 IMPLANT
BOWL SMART MIX CTS (DISPOSABLE) ×2 IMPLANT
CAPT RP KNEE ×2 IMPLANT
CEMENT BONE GENTAMICIN 40 (Cement) ×2 IMPLANT
CUFF TOURN SGL QUICK 34 (TOURNIQUET CUFF) ×4
CUFF TRNQT CYL 34X4X40X1 (TOURNIQUET CUFF) ×1 IMPLANT
DECANTER SPIKE VIAL GLASS SM (MISCELLANEOUS) ×2 IMPLANT
DERMABOND ADVANCED (GAUZE/BANDAGES/DRESSINGS) ×2
DERMABOND ADVANCED .7 DNX12 (GAUZE/BANDAGES/DRESSINGS) ×2 IMPLANT
DRAPE EXTREMITY BILATERAL (DRAPE) ×2 IMPLANT
DRAPE EXTREMITY T 121X128X90 (DRAPE) ×2 IMPLANT
DRAPE INCISE IOBAN 66X45 STRL (DRAPES) ×2 IMPLANT
DRAPE LG THREE QUARTER DISP (DRAPES) ×2 IMPLANT
DRAPE POUCH INSTRU U-SHP 10X18 (DRAPES) ×2 IMPLANT
DRAPE U-SHAPE 47X51 STRL (DRAPES) ×4 IMPLANT
DRSG AQUACEL AG ADV 3.5X10 (GAUZE/BANDAGES/DRESSINGS) ×4 IMPLANT
DRSG TEGADERM 4X4.75 (GAUZE/BANDAGES/DRESSINGS) IMPLANT
DURAPREP 26ML APPLICATOR (WOUND CARE) ×8 IMPLANT
ELECT REM PT RETURN 9FT ADLT (ELECTROSURGICAL) ×2
ELECTRODE REM PT RTRN 9FT ADLT (ELECTROSURGICAL) ×1 IMPLANT
EVACUATOR 1/8 PVC DRAIN (DRAIN) IMPLANT
FACESHIELD WRAPAROUND (MASK) ×10 IMPLANT
GAUZE SPONGE 2X2 8PLY STRL LF (GAUZE/BANDAGES/DRESSINGS) IMPLANT
GLOVE BIO SURGEON STRL SZ7 (GLOVE) ×2 IMPLANT
GLOVE BIOGEL PI IND STRL 6.5 (GLOVE) ×1 IMPLANT
GLOVE BIOGEL PI IND STRL 7.5 (GLOVE) ×2 IMPLANT
GLOVE BIOGEL PI IND STRL 8 (GLOVE) ×1 IMPLANT
GLOVE BIOGEL PI INDICATOR 6.5 (GLOVE) ×1
GLOVE BIOGEL PI INDICATOR 7.5 (GLOVE) ×2
GLOVE BIOGEL PI INDICATOR 8 (GLOVE) ×2
GLOVE ECLIPSE 7.5 STRL STRAW (GLOVE) ×2 IMPLANT
GLOVE ECLIPSE 8.0 STRL XLNG CF (GLOVE) ×2 IMPLANT
GLOVE ORTHO TXT STRL SZ7.5 (GLOVE) ×4 IMPLANT
GLOVE SURG SS PI 6.5 STRL IVOR (GLOVE) ×2 IMPLANT
GOWN SPEC L3 XXLG W/TWL (GOWN DISPOSABLE) ×4 IMPLANT
GOWN STRL NON-REIN LRG LVL3 (GOWN DISPOSABLE) ×2 IMPLANT
GOWN STRL REUS W/TWL LRG LVL3 (GOWN DISPOSABLE) ×4 IMPLANT
HANDPIECE INTERPULSE COAX TIP (DISPOSABLE) ×2
KIT BASIN OR (CUSTOM PROCEDURE TRAY) ×2 IMPLANT
MANIFOLD NEPTUNE II (INSTRUMENTS) ×2 IMPLANT
NDL SAFETY ECLIPSE 18X1.5 (NEEDLE) ×1 IMPLANT
NEEDLE HYPO 18GX1.5 SHARP (NEEDLE) ×2
NS IRRIG 1000ML POUR BTL (IV SOLUTION) ×2 IMPLANT
PACK TOTAL JOINT (CUSTOM PROCEDURE TRAY) ×2 IMPLANT
POSITIONER SURGICAL ARM (MISCELLANEOUS) ×2 IMPLANT
SET HNDPC FAN SPRY TIP SCT (DISPOSABLE) ×1 IMPLANT
SET PAD KNEE POSITIONER (MISCELLANEOUS) ×3 IMPLANT
SPONGE GAUZE 2X2 STER 10/PKG (GAUZE/BANDAGES/DRESSINGS)
SPONGE LAP 18X18 X RAY DECT (DISPOSABLE) ×2 IMPLANT
STOCKINETTE 6  STRL (DRAPES) ×1
STOCKINETTE 6 STRL (DRAPES) ×1 IMPLANT
SUCTION FRAZIER 12FR DISP (SUCTIONS) ×2 IMPLANT
SUT MNCRL AB 4-0 PS2 18 (SUTURE) ×4 IMPLANT
SUT VIC AB 1 CT1 36 (SUTURE) ×4 IMPLANT
SUT VIC AB 2-0 CT1 27 (SUTURE) ×8
SUT VIC AB 2-0 CT1 TAPERPNT 27 (SUTURE) ×4 IMPLANT
SUT VLOC 180 0 24IN GS25 (SUTURE) ×2 IMPLANT
SYR 50ML LL SCALE MARK (SYRINGE) ×2 IMPLANT
TOWEL OR 17X26 10 PK STRL BLUE (TOWEL DISPOSABLE) ×2 IMPLANT
TOWEL OR NON WOVEN STRL DISP B (DISPOSABLE) IMPLANT
TRAY FOLEY CATH 14FRSI W/METER (CATHETERS) IMPLANT
TRAY FOLEY CATH 16FRSI W/METER (SET/KITS/TRAYS/PACK) ×2 IMPLANT
WATER STERILE IRR 1500ML POUR (IV SOLUTION) ×2 IMPLANT
WRAP KNEE MAXI GEL POST OP (GAUZE/BANDAGES/DRESSINGS) ×2 IMPLANT

## 2013-06-22 NOTE — Telephone Encounter (Signed)
Med filled.  

## 2013-06-22 NOTE — Interval H&P Note (Signed)
History and Physical Interval Note:  06/22/2013 10:28 AM  Gary Butler  has presented today for surgery, with the diagnosis of left knee osteoarthritis, pain, persistent right medial questional neuroma  The various methods of treatment have been discussed with the patient and family. After consideration of risks, benefits and other options for treatment, the patient has consented to  Procedure(s): LEFT TOTAL KNEE ARTHROPLASTY WITH POSSIBLE MEDIAL SOFT TISSUE EXPLORATION SAPHENOUS NEURECTOMY, right knee as a surgical intervention .  The patient's history has been reviewed, patient examined, no change in status, stable for surgery.  I have reviewed the patient's chart and labs.  Questions were answered to the patient's satisfaction.     Mauri Pole

## 2013-06-22 NOTE — Brief Op Note (Signed)
06/22/2013  2:02 PM  PATIENT:  Gary Butler  68 y.o. male  PRE-OPERATIVE DIAGNOSIS:  1.  left knee osteoarthritis,  2.   persistent right knee medial based pain, related to painful neuroma  POST-OPERATIVE DIAGNOSIS: 1.  left knee osteoarthritis,  2.   persistent right knee medial based pain, related to painful neuroma  PROCEDURE:  Procedure(s): 1.  LEFT TOTAL KNEE ARTHROPLASTY 2.  Right knee saphenous nerve branch neurectomy  SURGEON:  Surgeon(s) and Role:    * Mauri Pole, MD - Primary  PHYSICIAN ASSISTANT: Danae Orleans, PA-C  ASSISTANTS: None  ANESTHESIA:   spinal and general  EBL:  Total I/O In: 1000 [I.V.:1000] Out: 600 [Urine:500; Blood:100]  BLOOD ADMINISTERED:none  DRAINS: none   LOCAL MEDICATIONS USED:  Exparel/marcaine combination, left knee only  SPECIMEN:  No Specimen  DISPOSITION OF SPECIMEN:  N/A  COUNTS:  YES  TOURNIQUET:   Total Tourniquet Time Documented: Thigh (Left) - 40 minutes Total: Thigh (Left) - 40 minutes  Thigh (Right) - 8 minutes Total: Thigh (Right) - 8 minutes   DICTATION: .Other Dictation: Dictation Number [id number]  PLAN OF CARE: Admit to inpatient   PATIENT DISPOSITION:  PACU - hemodynamically stable.   Delay start of Pharmacological VTE agent (>24hrs) due to surgical blood loss or risk of bleeding: no

## 2013-06-22 NOTE — Anesthesia Preprocedure Evaluation (Signed)
Anesthesia Evaluation  Patient identified by MRN, date of birth, ID band Patient awake    Reviewed: Allergy & Precautions, H&P , NPO status , Patient's Chart, lab work & pertinent test results, reviewed documented beta blocker date and time   Airway Mallampati: II TM Distance: >3 FB Neck ROM: Full    Dental  (+) Dental Advisory Given   Pulmonary sleep apnea ,  breath sounds clear to auscultation        Cardiovascular hypertension, Pt. on medications and Pt. on home beta blockers + Valvular Problems/Murmurs Rhythm:Regular Rate:Normal     Neuro/Psych negative neurological ROS  negative psych ROS   GI/Hepatic negative GI ROS, Neg liver ROS,   Endo/Other  diabetes, Type 2, Oral Hypoglycemic AgentsMorbid obesity  Renal/GU negative Renal ROS     Musculoskeletal negative musculoskeletal ROS (+)   Abdominal (+) + obese,   Peds  Hematology negative hematology ROS (+)   Anesthesia Other Findings   Reproductive/Obstetrics                           Anesthesia Physical Anesthesia Plan  ASA: III  Anesthesia Plan:    Post-op Pain Management:    Induction:   Airway Management Planned:   Additional Equipment:   Intra-op Plan:   Post-operative Plan:   Informed Consent: I have reviewed the patients History and Physical, chart, labs and discussed the procedure including the risks, benefits and alternatives for the proposed anesthesia with the patient or authorized representative who has indicated his/her understanding and acceptance.   Dental advisory given  Plan Discussed with: CRNA  Anesthesia Plan Comments:         Anesthesia Quick Evaluation

## 2013-06-22 NOTE — Anesthesia Postprocedure Evaluation (Signed)
Anesthesia Post Note  Patient: Gary Butler  Procedure(s) Performed: Procedure(s) (LRB): LEFT TOTAL KNEE ARTHROPLASTY;  MEDIAL SOFT TISSUE EXPLORATION SAPHENOUS NEURECTOMY RIGHT KNEE (Bilateral)  Anesthesia type: General  Patient location: PACU  Post pain: Pain level controlled  Post assessment: Post-op Vital signs reviewed  Last Vitals: BP 122/74  Pulse 75  Temp(Src) 34.7 C (Oral)  Resp 20  Ht 5' 11.5" (1.816 m)  Wt 345 lb 10.1 oz (156.777 kg)  BMI 47.54 kg/m2  SpO2 96%  Post vital signs: Reviewed  Level of consciousness: sedated  Complications: No apparent anesthesia complications

## 2013-06-22 NOTE — Transfer of Care (Signed)
Immediate Anesthesia Transfer of Care Note  Patient: Gary Butler  Procedure(s) Performed: Procedure(s) (LRB): LEFT TOTAL KNEE ARTHROPLASTY;  MEDIAL SOFT TISSUE EXPLORATION SAPHENOUS NEURECTOMY RIGHT KNEE (Bilateral)  Patient Location: PACU  Anesthesia Type: General  Level of Consciousness: sedated, patient cooperative and responds to stimulation  Airway & Oxygen Therapy: Patient Spontanous Breathing and Patient connected to face mask oxgen  Post-op Assessment: Report given to PACU RN and Post -op Vital signs reviewed and stable  Post vital signs: Reviewed and stable  Complications: No apparent anesthesia complications

## 2013-06-22 NOTE — Progress Notes (Signed)
RT went to set up patient home CPAP machine. Patient home setting is 15 cmH2O. Patient stated he will put his own mask on when he is ready. Water chamber is filled with sterile water for humidification. RT will monitor as needed.

## 2013-06-22 NOTE — Progress Notes (Signed)
Utilization review completed.  

## 2013-06-22 NOTE — Telephone Encounter (Signed)
lmonvm advising the pt of his next f/u appts in may.

## 2013-06-22 NOTE — Anesthesia Procedure Notes (Signed)
Spinal  Start time: 06/22/2013 11:35 AM End time: 06/22/2013 11:41 AM Staffing Anesthesiologist: Nickie Retort CRNA/Resident: Darlys Gales R Performed by: resident/CRNA and anesthesiologist  Preanesthetic Checklist Completed: patient identified, site marked, surgical consent, pre-op evaluation, timeout performed, IV checked, risks and benefits discussed and monitors and equipment checked Spinal Block Patient position: sitting Prep: Betadine Patient monitoring: heart rate, cardiac monitor, continuous pulse ox and blood pressure Approach: midline Location: L3-4 Needle Needle gauge: 22 G Needle length: 12.7 cm Needle insertion depth: 11 cm Assessment Events: failed spinal Additional Notes CSF return. Block failed to produce motor/sensory block, some numbness noted. Converted to general.

## 2013-06-23 LAB — CBC
HCT: 43.5 % (ref 39.0–52.0)
Hemoglobin: 14.7 g/dL (ref 13.0–17.0)
MCH: 31 pg (ref 26.0–34.0)
MCHC: 33.8 g/dL (ref 30.0–36.0)
MCV: 91.8 fL (ref 78.0–100.0)
Platelets: 154 10*3/uL (ref 150–400)
RBC: 4.74 MIL/uL (ref 4.22–5.81)
RDW: 15.5 % (ref 11.5–15.5)
WBC: 10.2 10*3/uL (ref 4.0–10.5)

## 2013-06-23 LAB — BASIC METABOLIC PANEL
BUN: 20 mg/dL (ref 6–23)
CO2: 28 mEq/L (ref 19–32)
Calcium: 8.8 mg/dL (ref 8.4–10.5)
Chloride: 98 mEq/L (ref 96–112)
Creatinine, Ser: 1.04 mg/dL (ref 0.50–1.35)
GFR calc Af Amer: 83 mL/min — ABNORMAL LOW (ref >90)
GFR calc non Af Amer: 72 mL/min — ABNORMAL LOW (ref >90)
Glucose, Bld: 122 mg/dL — ABNORMAL HIGH (ref 70–99)
Potassium: 3.9 mEq/L (ref 3.7–5.3)
Sodium: 136 mEq/L — ABNORMAL LOW (ref 137–147)

## 2013-06-23 LAB — GLUCOSE, CAPILLARY
Glucose-Capillary: 112 mg/dL — ABNORMAL HIGH (ref 70–99)
Glucose-Capillary: 131 mg/dL — ABNORMAL HIGH (ref 70–99)
Glucose-Capillary: 199 mg/dL — ABNORMAL HIGH (ref 70–99)
Glucose-Capillary: 220 mg/dL — ABNORMAL HIGH (ref 70–99)

## 2013-06-23 MED ORDER — HYDROCODONE-ACETAMINOPHEN 7.5-325 MG PO TABS
1.0000 | ORAL_TABLET | ORAL | Status: DC
  Administered 2013-06-23 – 2013-06-25 (×11): 2 via ORAL
  Filled 2013-06-23 (×11): qty 2

## 2013-06-23 MED ORDER — TRIAMTERENE-HCTZ 37.5-25 MG PO TABS
1.0000 | ORAL_TABLET | Freq: Every day | ORAL | Status: DC
  Administered 2013-06-24 – 2013-06-25 (×2): 1 via ORAL
  Filled 2013-06-23 (×2): qty 1

## 2013-06-23 NOTE — Op Note (Signed)
NAMEMORGEN, RITACCO                   ACCOUNT NO.:  [id number]  MEDICAL RECORD NO.:  [id number]  LOCATION:  5409                         FACILITY:  [facility]  PHYSICIAN:  Pietro Cassis. Alvan Dame, M.D.  DATE OF BIRTH:  [birth year]  DATE OF PROCEDURE:  06/22/2013 DATE OF DISCHARGE:                              OPERATIVE REPORT   PREOPERATIVE DIAGNOSES: 1. 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 2. Painful right saphenous nerve branch over the proximal medial     tibia.  POSTOPERATIVE DIAGNOSES: 1. 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 2. Painful right saphenous nerve branch over the proximal medial     tibia.  PROCEDURES: 1. Left total knee arthroplasty, components used DePuy rotating     platform posterior stabilized knee system with a size 4 femur, a     size 5 tibial tray, 12.5 posterior stabilized insert to match the 4     femur and a 41 patellar button. 2. Right knee saphenous nerve branch neurectomy.  SURGEON:  Pietro Cassis. Alvan Dame, M.D.  ASSISTANT:  Danae Orleans, PA-C.  Note that, [name] was present for the entirety of the case from preoperative positioning to perioperative management of the operative extremity, general facilitation of the case, and primary wound closure.  ANESTHESIA:  Spinal plus a general.  DRAINS:  None.  COMPLICATIONS:  None.  TOURNIQUET TIME:  Eight minutes on the right knee at 250 mmHg and 40 minutes on the left knee at 250 mmHg.  DRAINS:  None.  INDICATIONS FOR PROCEDURE:  [name] is a pleasant 68 year old male who had presented to the office a few years back after his right total knee arthroplasty.  He has had persistent pain.  We have worked his knee up in the past to rule out infection.  He has had persistent medial based pain with findings of hypersensitivity over the skin area  that had responded well to a diagnostic Marcaine injection in the office.  He had had some relief from the symptoms here.  In addition, to what is going on his right knee, his left knee arthritis has progressed and he had failed conservative measures.  Based on his results of the right knee, he has been hesitant to have his left knee replaced.  However, at this point, he recognizes he is failing all conservative measures and needed something more definitively done.  We discussed the risks of persistent pain, as he experienced with his right knee and the risks of infection, DVT, component failure.  We discussed his medical comorbidities.  Risks and benefits were discussed with both knees with regard to persistent pain.  Consent was obtained.  PROCEDURE IN DETAIL:  The patient was brought to the operative theater. Once adequate anesthesia was established, preoperative antibiotics, 900 mg of Cleocin administered.  He was positioned supine.  Bilateral thigh tourniquets were applied.  Both lower extremities were then prepped and draped in sterile fashion  simultaneously.  A time-out was performed identifying the patient, planned procedures and the extremities both being identified and marked  separately.  Attention was first directed to the right lower extremity.  The right lower extremity is exsanguinated. Tourniquet elevated to 250 mmHg.  I preoperatively had identified and marked area as point sensitivity and identified this on the skin.  I then made a midline incision through his old incision and created soft tissue planes down to the capsule medially and dissected medially from the midline incisions on the capsule as well as in the subcutaneous tissue.  As I narrowed my focus more medially in the location where he was identified to be most hypersensitive, I was able to identify the main trunk of this infrapatellar branch saphenous nerve, and then excised this near the sagittal  midline.  Once this was done and I was satisfied with the debridement based on palpation and direct visual inspection, we let the tourniquet down after 8 minutes.  Hemostasis was obtained as much as possible, minor punctate bleeding persists.  We irrigated the wound out with normal saline solution.  Then we reapproximated the subcu layer with 2-0 Vicryl and then a running 4-0 Monocryl.  The knee was then cleaned, dried, and dressed sterilely with Dermabond and Aquacel dressing and an Ace wrap.  As this right knee was being closed, attention was now directed to the left knee.  The left lower extremity was exsanguinated.  Tourniquet elevated to 250 mmHg.  The midline incision was made followed by a soft tissue dissection and exposure.  Median parapatellar arthrotomy was then made.  Following initial exposure and synovectomy with a proximal medial peel, retractors were placed.  Attention was first directed to the patella.  Precut measurement was noted to be about 25 mm.  I resected down to about 14-15 mm.  I used a 41 patellar button to cover the cut surface and restore patellar height. A metal shim was placed on in the lug holes to protect the patella from retractors and saw blades.  At this point, attention was directed to the femur for a distal femoral cut.  The intramedullary rod was opened with a drill, irrigated to prevent fat emboli.  An intramedullary rod was then passed and at 5 degrees of valgus resected 10 mm of bone off the distal femur. Following this resection, the tibia was subluxated anteriorly and using extramedullary guide with the stylus at 2 mm, I resected proximal tibial bone.  I confirmed that the gap would be stable with at least a 10 mm insert and as the cut was perpendicular in the coronal plane using alignment rod and the tibial tray.  Once this was done, we sized the femur.  The femur was sized to be a size 4 in the anterior-posterior dimension and the size  4 cutting block was then pinned into position with rotation set off the C-clamp tensioning the medial and lateral ligaments.  The 4 in 1 cutting block was positioned, anterior and posterior chamfer cuts were then made without difficulty, nor notching based on the anterior reference.  Following this, the final box cut was made for the size 4 femur based on the lateral aspect of the femur.  The tibia was then subluxated anteriorly and using the size 5 tibial tray, it was pinned in position through the medial third of the tubercle, drilled and then keel punched.  A trial reduction was now carried out with a 4 femur, 5 tibial tray in place, and a 12.5 insert.  With this, the knee came to full extension.  With good flexion, the patella tracking was  noted to track through the trochlea without application of pressure.  Given these findings, I injected the synovial capsule junction of the knee at this point with 20 mL of Exparel, 30 mL of 0.25% Marcaine with epinephrine, and 1 mL of Toradol.  We removed the trial components, irrigated the knee out with normal saline solution.  Final components were opened and cement was mixed. The final components were then cemented into position.  The knee was brought to extension with a 12.5 insert and allowed to stay in extension until the cement cured.  Excessive cement was removed.  Once the cement had finally cured, excessive cement was removed throughout the knee.  We then selected the final size 4 x 12.5 insert which was placed in the knee.  The knee was reduced.  We irrigated the knee again at this point with normal saline solution.  The tourniquet had been let down after 41 minutes.  The extensor mechanism was then reapproximated using #1 Vicryl and 0 V-Loc sutures.  The remainder of wound was closed with 2-0 Vicryl and a running 4-0 Monocryl.  The knee was cleaned, dried, and dressed sterilely using Dermabond and Aquacel dressing.  The patient was  then brought to the recovery room in stable condition, extubated, tolerating the procedure well.  Findings reviewed with family and then with Layla Barter.     Pietro Cassis Alvan Dame, M.D.     MDO/MEDQ  D:  06/22/2013  T:  06/23/2013  Job:  [id number]

## 2013-06-23 NOTE — Evaluation (Signed)
Physical Therapy Evaluation Patient Details Name: Gary Butler MRN: [id number] DOB: 1945-05-15 Today's Date: 06/23/2013   History of Present Illness  Pt is a 68 year old male s/p L TKR and Right knee saphenous nerve branch neurectomy.  Clinical Impression  Patient is s/p above surgeries resulting in functional limitations due to the deficits listed below (see PT Problem List).  Patient will benefit from skilled PT to increase their independence and safety with mobility to allow discharge to the venue listed below.  Pt reports he plans to d/c to SNF.  Pt happy to be up ambulating however still with increased pain limiting distance.     Follow Up Recommendations SNF    Equipment Recommendations  None recommended by PT    Recommendations for Other Services       Precautions / Restrictions Precautions Precautions: Knee Restrictions Other Position/Activity Restrictions: WBAT bilaterally      Mobility  Bed Mobility Overal bed mobility: Needs Assistance Bed Mobility: Supine to Sit     Supine to sit: Min assist;HOB elevated     General bed mobility comments: verbal cues for technique, assist for LEs support over bed  Transfers Overall transfer level: Needs assistance Equipment used: Rolling walker (2 wheeled) Transfers: Sit to/from Stand Sit to Stand: Mod assist;From elevated surface;+2 safety/equipment;+2 physical assistance         General transfer comment: verbal cues for technique, assist to rise  Ambulation/Gait Ambulation/Gait assistance: Min guard Ambulation Distance (Feet): 40 Feet Assistive device: Rolling walker (2 wheeled) Gait Pattern/deviations: Step-to pattern;Antalgic;Trunk flexed Gait velocity: decreased   General Gait Details: pt reports more pain on L LE, verbal cues for sequence, posture, step length, increased bilateral trunk lean observed (likely due to both obesity and bil knee surgeries)  Stairs            Wheelchair Mobility    Modified  Rankin (Stroke Patients Only)       Balance                                             Pertinent Vitals/Pain Pt reported pain issues earlier this morning however was ready for therapy upon visit, premedicated, reports feeling better with movement however ambulation limited by pain, activity to tolerance    Home Living Family/patient expects to be discharged to:: Skilled nursing facility Living Arrangements: Spouse/significant other                    Prior Function Level of Independence: Independent               Hand Dominance        Extremity/Trunk Assessment               Lower Extremity Assessment: RLE deficits/detail;LLE deficits/detail RLE Deficits / Details: good knee flexion observed for sit to stand LLE Deficits / Details: good quad contraction, ROM TBA     Communication   Communication: HOH  Cognition Arousal/Alertness: Awake/alert Behavior During Therapy: WFL for tasks assessed/performed Overall Cognitive Status: Within Functional Limits for tasks assessed                      General Comments      Exercises        Assessment/Plan    PT Assessment Patient needs continued PT services  PT Diagnosis Difficulty walking;Acute pain   PT  Problem List Decreased strength;Decreased range of motion;Decreased mobility;Decreased knowledge of precautions;Obesity  PT Treatment Interventions Functional mobility training;Gait training;DME instruction;Patient/family education;Therapeutic activities;Therapeutic exercise   PT Goals (Current goals can be found in the Care Plan section) Acute Rehab PT Goals PT Goal Formulation: With patient Time For Goal Achievement: 06/27/13 Potential to Achieve Goals: Good    Frequency 7X/week   Barriers to discharge        Co-evaluation               End of Session   Activity Tolerance: Patient limited by pain Patient left: in chair;with call bell/phone within reach;with  nursing/sitter in room           Time: 6045-4098 PT Time Calculation (min): 16 min   Charges:   PT Evaluation $Initial PT Evaluation Tier I: 1 Procedure PT Treatments $Gait Training: 8-22 mins   PT G CodesJunius Argyle 06/23/2013, 1:35 PM Carmelia Bake, PT, DPT 06/23/2013 Pager: [phone]

## 2013-06-23 NOTE — Discharge Instructions (Signed)
Information on my medicine - XARELTO® (Rivaroxaban) ° °This medication education was reviewed with me or my healthcare representative as part of my discharge preparation.  The pharmacist that spoke with me during my hospital stay was:  Zeinab Rodwell Marshall Rhemi Balbach, RPH ° °Why was Xarelto® prescribed for you? °Xarelto® was prescribed for you to reduce the risk of blood clots forming after orthopedic surgery. The medical term for these abnormal blood clots is venous thromboembolism (VTE). ° °What do you need to know about xarelto® ? °Take your Xarelto® ONCE DAILY at the same time every day. °You may take it either with or without food. ° °If you have difficulty swallowing the tablet whole, you may crush it and mix in applesauce just prior to taking your dose. ° °Take Xarelto® exactly as prescribed by your doctor and DO NOT stop taking Xarelto® without talking to the doctor who prescribed the medication.  Stopping without other VTE prevention medication to take the place of Xarelto® may increase your risk of developing a clot. ° °After discharge, you should have regular check-up appointments with your healthcare provider that is prescribing your Xarelto®.   ° °What do you do if you miss a dose? °If you miss a dose, take it as soon as you remember on the same day then continue your regularly scheduled once daily regimen the next day. Do not take two doses of Xarelto® on the same day.  ° °Important Safety Information °A possible side effect of Xarelto® is bleeding. You should call your healthcare provider right away if you experience any of the following: °  Bleeding from an injury or your nose that does not stop. °  Unusual colored urine (red or dark brown) or unusual colored stools (red or black). °  Unusual bruising for unknown reasons. °  A serious fall or if you hit your head (even if there is no bleeding). ° °Some medicines may interact with Xarelto® and might increase your risk of bleeding while on Xarelto®. To help  avoid this, consult your healthcare provider or pharmacist prior to using any new prescription or non-prescription medications, including herbals, vitamins, non-steroidal anti-inflammatory drugs (NSAIDs) and supplements. ° °This website has more information on Xarelto®: www.xarelto.com. ° ° ° °

## 2013-06-23 NOTE — Care Management Note (Addendum)
    Page 1 of 1   06/25/2013     1:13:28 PM CARE MANAGEMENT NOTE 06/25/2013  Patient:  [facility]   Account Number:  [id number]  Date Initiated:  06/23/2013  Documentation initiated by:  [facility]  Subjective/Objective Assessment:   adm: L knee pain, arthritis, neuroma; L TOTAL KNEE ARTHROPLASTY; MEDIAL SOFT TISSUE EXP SAPHENOUS NEURECTOMY R KNEE     Action/Plan:   discharge planning; SNF for rehab   Anticipated DC Date:  06/23/2013   Anticipated DC Plan:  [place]  CM consult      Choice offered to / List presented to:             Status of service:  Completed, signed off Medicare Important Message given?  YES (If response is "NO", the following Medicare IM given date fields will be blank) Date Medicare IM given:  06/10/2013 Date Additional Medicare IM given:  06/25/2013  Discharge Disposition:  [place]  Per UR Regulation:    If discussed at Long Length of Stay Meetings, dates discussed:    Comments:  06/25/13 [facility] RN,BSN NCM [phone] D/C SNF.  06/23/13 14:00 CSW arranged for pt to go to [facility].  IM message given to pt preoperatively.  No other Cm needs were communicated.  Mariane Masters, BSN, CM [phone].

## 2013-06-23 NOTE — Progress Notes (Signed)
OT Cancellation Note  Patient Details Name: Trayvion Embleton MRN: [id number] DOB: Jun 23, 1945   Cancelled Treatment:    Reason Eval/Treat Not Completed: Other (comment).  Spoke to SW and OT eval is likely not needed for admission to SNF.  If something changes, please page me.  Thank you.  Lesle Chris 06/23/2013, 2:20 PM Lesle Chris, OTR/L [phone] 06/23/2013

## 2013-06-23 NOTE — Progress Notes (Signed)
06/23/13 2000 Nursing  Patient's cbg 200 at this time. Patient refusing any insulin coverage at this time.

## 2013-06-23 NOTE — Progress Notes (Signed)
   Subjective: 1 Day Post-Op Procedure(s) (LRB): LEFT TOTAL KNEE ARTHROPLASTY;  MEDIAL SOFT TISSUE EXPLORATION SAPHENOUS NEURECTOMY RIGHT KNEE (Bilateral)   Patient reports pain as mild, controlled, with medication in both knees. No events throughout the night. Ready to start doing PT, feels like he will do better once moving he knees.    Objective:   VITALS:   Filed Vitals:   06/23/13  BP: 112/68  Pulse: 75  Temp: 97.7 F (36.5 C)   Resp: 18    Neurovascular intact Dorsiflexion/Plantar flexion intact Incision: dressing C/D/I No cellulitis present Compartment soft  LABS  Recent Labs  06/23/13 0504  HGB 14.7  HCT 43.5  WBC 10.2  PLT 154     Recent Labs  06/23/13 0504  NA 136*  K 3.9  BUN 20  CREATININE 1.04  GLUCOSE 122*     Assessment/Plan: 1 Day Post-Op Procedure(s) (LRB): LEFT TOTAL KNEE ARTHROPLASTY;  MEDIAL SOFT TISSUE EXPLORATION SAPHENOUS NEURECTOMY RIGHT KNEE (Bilateral) Foley cath d/c'ed Advance diet Up with therapy D/C IV fluids Discharge to SNF eventually, when ready  Morbid Obesity (BMI >40)  Estimated body mass index is 47.54 kg/(m^2) as calculated from the following:   Height as of this encounter: 5' 11.5" (1.816 m).   Weight as of this encounter: 156.777 kg (345 lb 10.1 oz). Patient also counseled that weight may inhibit the healing process Patient counseled that losing weight will help with future health issues       [place] P[place]. Franke Menter   PAC  06/23/2013, 8:13 AM

## 2013-06-23 NOTE — Progress Notes (Signed)
Physical Therapy Treatment Note   06/23/13 1600  PT Visit Information  Last PT Received On 06/23/13  Assistance Needed +2  History of Present Illness Pt is a 68 year old male s/p L TKR and Right knee saphenous nerve branch neurectomy.  PT Time Calculation  PT Start Time 1509  PT Stop Time 1524  PT Time Calculation (min) 15 min  Subjective Data  Subjective Pt just back from trying to use bathroom so performed exercises in recliner for bilateral LEs.  Pt very pleasant and motivated.  Precautions  Precautions Knee  Restrictions  Other Position/Activity Restrictions WBAT bilaterally  Cognition  Arousal/Alertness Awake/alert  Behavior During Therapy WFL for tasks assessed/performed  Overall Cognitive Status Within Functional Limits for tasks assessed  Exercises  Exercises Total Joint  Total Joint Exercises  Ankle Circles/Pumps AROM;15 reps;Both  Quad Sets AROM;Both;20 reps  Short Arc [employer];Both;20 reps (AAROM for L LE)  Heel Slides AAROM;Both;AROM;15 reps (AAROM for L LE)  Hip ABduction/ADduction AROM;Both;20 reps  Goniometric ROM good resting extension observed bilaterally, L knee AAROM flexion 50* and R 75*  PT - End of Session  Activity Tolerance Patient tolerated treatment well  Patient left in chair;with call bell/phone within reach  PT - Assessment/Plan  PT Plan Current plan remains appropriate  PT Frequency 7X/week  Follow Up Recommendations SNF  PT equipment None recommended by PT  PT Goal Progression  Progress towards PT goals Progressing toward goals  PT General Charges  $$ ACUTE PT VISIT 1 Procedure  PT Treatments  $Therapeutic Exercise 8-22 mins   Carmelia Bake, PT, DPT 06/23/2013 Pager: [phone]

## 2013-06-23 NOTE — Progress Notes (Signed)
Clinical Social Work Department CLINICAL SOCIAL WORK PLACEMENT NOTE 06/23/2013  Patient:  Gary Butler, Gary Butler  Account Number:  [id number] Admit date:  06/22/2013  Clinical Social Worker:  Werner Lean, LCSW  Date/time:  06/23/2013 01:47 PM  Clinical Social Work is seeking post-discharge placement for this patient at the following level of care:   SKILLED NURSING   (*CSW will update this form in Epic as items are completed)     Patient/family provided with [place] Department of Clinical Social Work's list of facilities offering this level of care within the geographic area requested by the patient (or if unable, by the patient's family).  06/23/2013  Patient/family informed of their freedom to choose among providers that offer the needed level of care, that participate in Medicare, Medicaid or managed care program needed by the patient, have an available bed and are willing to accept the patient.    Patient/family informed of [facility]' ownership interest in [facility], as well as of the fact that they are under no obligation to receive care at this facility.  PASARR submitted to EDS on 06/22/2013 PASARR number received from EDS on 06/22/2013  FL2 transmitted to all facilities in geographic area requested by pt/family on  06/23/2013 FL2 transmitted to all facilities within larger geographic area on   Patient informed that his/her managed care company has contracts with or will negotiate with  certain facilities, including the following:     Patient/family informed of bed offers received:  06/23/2013 Patient chooses bed at [facility] Physician recommends and patient chooses bed at    Patient to be transferred to  on   Patient to be transferred to facility by   The following physician request were entered in Epic:   Additional Comments:  Werner Lean LCSW [phone]

## 2013-06-23 NOTE — Progress Notes (Signed)
Clinical Social Work Department BRIEF PSYCHOSOCIAL ASSESSMENT 06/23/2013  Patient:  Gary Butler     Account Number:  [id number]     Admit date:  06/22/2013  Clinical Social Worker:  Lacie Scotts  Date/Time:  06/23/2013 01:37 PM  Referred by:  Physician  Date Referred:  06/23/2013 Referred for  SNF Placement   Other Referral:   Interview type:  Patient Other interview type:    PSYCHOSOCIAL DATA Living Status:  WIFE Admitted from facility:   Level of care:   Primary support name:  Gary Butler Primary support relationship to patient:  SPOUSE Degree of support available:   supportive    CURRENT CONCERNS Current Concerns  Post-Acute Placement   Other Concerns:    SOCIAL WORK ASSESSMENT / PLAN Pt is a 68 yr old gentleman living at home with spouse prior to hospitalization. CSW met with pt / spouse to assist with d/c planning. This is a planned admission. Pt has made prior arrangements to have ST Rehab at [facility] following Butler d/c. CSW has contacted SNF and d/c plans have been confirmed. CSW will continue to follow to assist with d/c planning to SNF.   Assessment/plan status:  Psychosocial Support/Ongoing Assessment of Needs Other assessment/ plan:   Information/referral to community resources:   Insurance coverage for SNF and Ambulance transport reviewed.    PATIENT'S/FAMILY'S RESPONSE TO PLAN OF CARE: Pt slept fairly well last night following surgery. Pain is being controlled. Pt is motivated to work with therapist and is looking forward to rehab at [facility].   Werner Lean LCSW [phone]

## 2013-06-24 LAB — GLUCOSE, CAPILLARY
Glucose-Capillary: 154 mg/dL — ABNORMAL HIGH (ref 70–99)
Glucose-Capillary: 151 mg/dL — ABNORMAL HIGH (ref 70–99)
Glucose-Capillary: 127 mg/dL — ABNORMAL HIGH (ref 70–99)
Glucose-Capillary: 158 mg/dL — ABNORMAL HIGH (ref 70–99)

## 2013-06-24 NOTE — Progress Notes (Signed)
Physical Therapy Treatment Note   06/24/13 1500  PT Visit Information  Last PT Received On 06/24/13  Assistance Needed +1  History of Present Illness Pt is a 68 year old male s/p L TKR and Right knee saphenous nerve branch neurectomy.  PT Time Calculation  PT Start Time 1415  PT Stop Time 1425  PT Time Calculation (min) 10 min  Subjective Data  Subjective Pt ambulated in hallway and able to tolerate increase in distance.  Precautions  Precautions Knee  Restrictions  Other Position/Activity Restrictions WBAT bilaterally  Cognition  Arousal/Alertness Awake/alert  Behavior During Therapy WFL for tasks assessed/performed  Overall Cognitive Status Within Functional Limits for tasks assessed  Transfers  Overall transfer level Needs assistance  Equipment used Rolling walker (2 wheeled)  Transfers Sit to/from Stand  Sit to Stand Min guard  General transfer comment verbal cues for technique  Ambulation/Gait  Ambulation/Gait assistance Min guard  Ambulation Distance (Feet) 180 Feet  Assistive device Rolling walker (2 wheeled)  Gait Pattern/deviations Step-to pattern;Antalgic;Trunk flexed  General Gait Details increased bilateral trunk lean observed (likely due to both obesity and bil knee surgeries), verbal cues for heel strike  PT - End of Session  Activity Tolerance Patient tolerated treatment well  Patient left in chair;with call bell/phone within reach  PT - Assessment/Plan  PT Plan Current plan remains appropriate  PT Frequency 7X/week  Follow Up Recommendations SNF  PT equipment None recommended by PT  PT Goal Progression  Progress towards PT goals Progressing toward goals  PT General Charges  $$ ACUTE PT VISIT 1 Procedure  PT Treatments  $Gait Training 8-22 mins   Carmelia Bake, PT, DPT 06/24/2013 Pager: [phone]

## 2013-06-24 NOTE — Progress Notes (Signed)
Physical Therapy Treatment Patient Details Name: Gary Butler MRN: [id number] DOB: Jul 28, 1945 Today's Date: 06/24/2013    History of Present Illness Pt is a 68 year old male s/p L TKR and Right knee saphenous nerve branch neurectomy.    PT Comments    Pt ambulated in hallway and then performed LE exercises in recliner.  Pt plans to d/c to SNF tomorrow.  Follow Up Recommendations  SNF     Equipment Recommendations  None recommended by PT    Recommendations for Other Services       Precautions / Restrictions Precautions Precautions: Knee Restrictions Other Position/Activity Restrictions: WBAT bilaterally    Mobility  Bed Mobility                  Transfers Overall transfer level: Needs assistance Equipment used: Rolling walker (2 wheeled) Transfers: Sit to/from Stand Sit to Stand: Min guard         General transfer comment: verbal cues for technique  Ambulation/Gait Ambulation/Gait assistance: Min guard Ambulation Distance (Feet): 80 Feet Assistive device: Rolling walker (2 wheeled) Gait Pattern/deviations: Step-to pattern;Antalgic;Trunk flexed Gait velocity: decreased   General Gait Details: increased bilateral trunk lean observed (likely due to both obesity and bil knee surgeries)   Stairs            Wheelchair Mobility    Modified Rankin (Stroke Patients Only)       Balance                                    Cognition Arousal/Alertness: Awake/alert Behavior During Therapy: WFL for tasks assessed/performed Overall Cognitive Status: Within Functional Limits for tasks assessed                      Exercises Total Joint Exercises Ankle Circles/Pumps: AROM;Both;20 reps Quad Sets: AROM;Both;20 reps Towel Squeeze: AROM;Both;20 reps Short Arc Quad: AROM;AAROM;Both;20 reps (AAROM for L LE) Heel Slides: AAROM;Both;AROM;20 reps;Seated (AAROM for L LE) Hip ABduction/ADduction: AROM;Both;20 reps Straight Leg Raises:  AROM;AAROM;Both;10 reps (AAROM for L LE) Goniometric ROM: L knee AAROM 85* and L 110*    General Comments        Pertinent Vitals/Pain Activity to tolerance, ice packs applied    Home Living                      Prior Function            PT Goals (current goals can now be found in the care plan section) Progress towards PT goals: Progressing toward goals    Frequency  7X/week    PT Plan Current plan remains appropriate    Co-evaluation             End of Session   Activity Tolerance: Patient tolerated treatment well Patient left: in chair;with call bell/phone within reach     Time: 0258-5277 PT Time Calculation (min): 24 min  Charges:  $Gait Training: 8-22 mins $Therapeutic Exercise: 8-22 mins                    G Codes:      Junius Argyle 06/24/2013, 1:12 PM Carmelia Bake, PT, DPT 06/24/2013 Pager: [phone]

## 2013-06-24 NOTE — Progress Notes (Signed)
Pt is using his home CPAP machine and mask. Pt states he does not need any assistance with his machine or mask application. Pt was made aware if he should need any assistance to contact RT. RT will continue to monitor as needed.

## 2013-06-24 NOTE — Progress Notes (Signed)
   Subjective: 2 Days Post-Op Procedure(s) (LRB): LEFT TOTAL KNEE ARTHROPLASTY;  MEDIAL SOFT TISSUE EXPLORATION SAPHENOUS NEURECTOMY RIGHT KNEE (Bilateral)   Patient reports pain as mild, pain controlled. No events throughout the night. He knows that the left knee will be the typical pain after a knee replacement, but he is surprised about how well the right knee feels already.  Objective:   VITALS:   Filed Vitals:   06/24/13 0456  BP: 112/68  Pulse: 79  Temp: 98.2 F (36.8 C)  Resp: 18    Neurovascular intact Dorsiflexion/Plantar flexion intact Incision: dressing C/D/I No cellulitis present Compartment soft  LABS  Recent Labs  06/23/13 0504  HGB 14.7  HCT 43.5  WBC 10.2  PLT 154     Recent Labs  06/23/13 0504  NA 136*  K 3.9  BUN 20  CREATININE 1.04  GLUCOSE 122*     Assessment/Plan: 2 Days Post-Op Procedure(s) (LRB): LEFT TOTAL KNEE ARTHROPLASTY;  MEDIAL SOFT TISSUE EXPLORATION SAPHENOUS NEURECTOMY RIGHT KNEE (Bilateral) Up with therapy Discharge to SNF [facility]) eventually, when ready   Morbid Obesity (BMI >40)  Estimated body mass index is 47.54 kg/(m^2) as calculated from the following:      Height as of this encounter: 5' 11.5" (1.816 m).      Weight as of this encounter: 156.777 kg (345 lb 10.1 oz).  Patient also counseled that weight may inhibit the healing process  Patient counseled that losing weight will help with future health issues       West Pugh. Jadamarie Butson   PAC  06/24/2013, 9:17 AM

## 2013-06-25 LAB — GLUCOSE, CAPILLARY
Glucose-Capillary: 148 mg/dL — ABNORMAL HIGH (ref 70–99)
Glucose-Capillary: 164 mg/dL — ABNORMAL HIGH (ref 70–99)

## 2013-06-25 MED ORDER — HYDROCODONE-ACETAMINOPHEN 7.5-325 MG PO TABS: 1.0000 | ORAL_TABLET | ORAL | Status: DC

## 2013-06-25 MED ORDER — TIZANIDINE HCL 4 MG PO CAPS: 4.0000 mg | ORAL_CAPSULE | Freq: Three times a day (TID) | ORAL | Status: DC | PRN

## 2013-06-25 MED ORDER — FERROUS SULFATE 325 (65 FE) MG PO TABS: 325.0000 mg | ORAL_TABLET | Freq: Two times a day (BID) | ORAL | Status: DC

## 2013-06-25 MED ORDER — BISACODYL 10 MG RE SUPP
10.0000 mg | Freq: Once | RECTAL | Status: AC
  Administered 2013-06-25: 10 mg via RECTAL
  Filled 2013-06-25: qty 1

## 2013-06-25 MED ORDER — POLYETHYLENE GLYCOL 3350 17 G PO PACK: 17.0000 g | PACK | Freq: Two times a day (BID) | ORAL | Status: DC

## 2013-06-25 MED ORDER — RIVAROXABAN 10 MG PO TABS: 10.0000 mg | ORAL_TABLET | Freq: Every day | ORAL | Status: DC

## 2013-06-25 MED ORDER — DSS 100 MG PO CAPS: 100.0000 mg | ORAL_CAPSULE | Freq: Two times a day (BID) | ORAL | Status: DC

## 2013-06-25 NOTE — Progress Notes (Signed)
   Subjective: 3 Days Post-Op Procedure(s) (LRB): LEFT TOTAL KNEE ARTHROPLASTY;  MEDIAL SOFT TISSUE EXPLORATION SAPHENOUS NEURECTOMY RIGHT KNEE (Bilateral)   Seen by Dr. Alvan Dame Patient reports pain as mild, pain controlled well. No events throughout the night. Denies any CP or SOB. Ready to be discharged to skilled nursing facility.  Objective:   VITALS:   Filed Vitals:   06/25/13 0601  BP: 130/79  Pulse: 70  Temp: 98.1 F (36.7 C)  Resp: 20    Neurovascular intact Dorsiflexion/Plantar flexion intact Incision: dressing C/D/I No cellulitis present Compartment soft  LABS  Recent Labs  06/23/13 0504  HGB 14.7  HCT 43.5  WBC 10.2  PLT 154     Recent Labs  06/23/13 0504  NA 136*  K 3.9  BUN 20  CREATININE 1.04  GLUCOSE 122*     Assessment/Plan: 3 Days Post-Op Procedure(s) (LRB): LEFT TOTAL KNEE ARTHROPLASTY;  MEDIAL SOFT TISSUE EXPLORATION SAPHENOUS NEURECTOMY RIGHT KNEE (Bilateral) Up with therapy Discharge to SNF Follow up in 2 weeks at [facility]. Follow up with OLIN,Henrick Mcgue D in 2 weeks.  Contact information:  [facility] [address] [place] [address] [phone]    Morbid Obesity (BMI >40)  Estimated body mass index is 47.54 kg/(m^2) as calculated from the following:       Height as of this encounter: 5' 11.5" (1.816 m).       Weight as of this encounter: 156.777 kg (345 lb 10.1 oz).  Patient also counseled that weight may inhibit the healing process  Patient counseled that losing weight will help with future health issues       West Pugh. Tashana Haberl   PAC  06/25/2013, 7:33 AM

## 2013-06-25 NOTE — Progress Notes (Signed)
Patient is set to discharge to [facility] SNF today. Patient aware. Discharge packet in [place], [place] aware. PTAR scheduled for 1:30pm pickup (Service Request Id: [id number]).   Clinical Social Work Department CLINICAL SOCIAL WORK PLACEMENT NOTE 06/25/2013  Patient:  Gary Butler, Gary Butler  Account Number:  [id number] Admit date:  06/22/2013  Clinical Social Worker:  Werner Lean, LCSW  Date/time:  06/23/2013 01:47 PM  Clinical Social Work is seeking post-discharge placement for this patient at the following level of care:   SKILLED NURSING   (*CSW will update this form in Epic as items are completed)     Patient/family provided with [place] Department of Clinical Social Work's list of facilities offering this level of care within the geographic area requested by the patient (or if unable, by the patient's family).  06/23/2013  Patient/family informed of their freedom to choose among providers that offer the needed level of care, that participate in Medicare, Medicaid or managed care program needed by the patient, have an available bed and are willing to accept the patient.    Patient/family informed of [facility]' ownership interest in [facility], as well as of the fact that they are under no obligation to receive care at this facility.  PASARR submitted to EDS on 06/22/2013 PASARR number received from EDS on 06/22/2013  FL2 transmitted to all facilities in geographic area requested by pt/family on  06/23/2013 FL2 transmitted to all facilities within larger geographic area on   Patient informed that his/her managed care company has contracts with or will negotiate with  certain facilities, including the following:     Patient/family informed of bed offers received:  06/23/2013 Patient chooses bed at [facility] Physician recommends and patient chooses bed at    Patient to be transferred to [facility] on  06/25/2013 Patient to be transferred to facility by  PTAR  The following physician request were entered in Epic:   Additional Comments:   Raynaldo Opitz, [place] Worker cell #: [phone]

## 2013-06-25 NOTE — Progress Notes (Signed)
Attempted to call report to shannon grey, left on hold for 5 minutes. No response  D Clinical research associate

## 2013-06-25 NOTE — Discharge Summary (Signed)
Physician Discharge Summary  Patient ID: Gary Butler MRN: [id number] DOB/AGE: May 27, 1945 68 y.o.  Admit date: 06/22/2013 Discharge date:  06/25/2013  Procedures:  Procedure(s) (LRB): LEFT TOTAL KNEE ARTHROPLASTY;  MEDIAL SOFT TISSUE EXPLORATION SAPHENOUS NEURECTOMY RIGHT KNEE (Bilateral)  Attending Physician:  Dr. Paralee Cancel   Admission Diagnoses:   Bilateral knee pain, left knee OA / pain and right knee persistent medial knee pain / neuroma.  Discharge Diagnoses:  Principal Problem:   S/P left TKA Active Problems:   Morbid obesity  Past Medical History  Diagnosis Date  . Diabetes mellitus   . Hypertension   . OSA (obstructive sleep apnea)   . Arthritis   . Heart murmur   . Sleep apnea   . History of gout   . Psoriatic arthritis   . History of skin cancer   . Constipation   . Tinnitus     HPI:     Gary Butler, 68 y.o. male, has a history of pain and functional disability in the bilateral knee due to arthritis and has failed non-surgical conservative treatments for greater than 12 weeks to include NSAID's and/or analgesics, corticosteriod injections, viscosupplementation injections, supervised PT with diminished ADL's post treatment, use of assistive devices and activity modification. Onset of symptoms was gradual, starting 5-6 years ago with gradually worsening course since that time in the left knee. The patient noted prior procedures on the knee to include arthroplasty on the right knee done 3 years ago in [facility]. Patient currently rates pain in the left and right knee(s) at 6 out of 10 with activity. Patient has worsening of pain with activity and weight bearing, pain that interferes with activities of daily living, pain with passive range of motion, crepitus and joint swelling. Patient has evidence of periarticular osteophytes and joint space narrowing by imaging studies. There is no active infection. Risks, benefits and expectations were discussed with the patient. Risks  including but not limited to the risk of anesthesia, blood clots, nerve damage, blood vessel damage, failure of the prosthesis, infection and up to and including death. Patient understand the risks, benefits and expectations and wishes to proceed with surgery.  PCP: Annye Asa, MD   Discharged Condition: good  Hospital Course:  Patient underwent the above stated procedure on 06/22/2013. Patient tolerated the procedure well and brought to the recovery room in good condition and subsequently to the floor.  POD #1 BP: 112/68 ; Pulse: 75 ; Temp: 97.7 F (36.5 C) ; Resp: 18  Patient reports pain as mild, controlled, with medication in both knees. No events throughout the night. Ready to start doing PT, feels like he will do better once moving he knees.  Neurovascular intact, dorsiflexion/plantar flexion intact, incision: dressing C/D/I, no cellulitis present and compartment soft.   LABS  Basename    HGB  14.7  HCT  43.5   POD #2  BP: 112/68 ; Pulse: 79 ; Temp: 98.2 F (36.8 C) ; Resp: 18  Patient reports pain as mild, pain controlled. No events throughout the night. He knows that the left knee will be the typical pain after a knee replacement, but he is surprised about how well the right knee feels already. Neurovascular intact, dorsiflexion/plantar flexion intact, incision: dressing C/D/I, no cellulitis present and compartment soft.   LABS   No new labs  POD #3  BP: 130/79 ; Pulse: 70 ; Temp: 98.1 F (36.7 C) ; Resp: 20 Patient reports pain as mild, pain controlled well. No events throughout  the night. Denies any CP or SOB. Ready to be discharged to skilled nursing facility. Neurovascular intact, dorsiflexion/plantar flexion intact, incision: dressing C/D/I, no cellulitis present and compartment soft.   LABS   No new labs   Discharge Exam: General appearance: alert, cooperative and no distress Extremities: Homans sign is negative, no sign of DVT, no edema, redness or tenderness  in the calves or thighs and no ulcers, gangrene or trophic changes  Disposition: Skilled nursing facility  with follow up in 2 weeks   Follow-up Information   Follow up with Mauri Pole, MD. Schedule an appointment as soon as possible for a visit in 2 weeks.   Specialty:  Orthopedic Surgery   Contact information:   [address] [place] [address] [phone]       Discharge Orders   Future Appointments Provider Department Dept Phone   06/10/2014 9:15 AM Kathee Delton, MD [facility] [phone]   06/23/2014 9:30 AM [facility] [phone]   06/23/2014 10:00 AM [facility] Covering Provider [facility] [phone]   Future Orders Complete By Expires   Call MD / Call 911  As directed    Scheduling Instructions:   If you experience chest pain or shortness of breath, CALL 911 and be transported to the hospital emergency room.  If you develope a fever above 101 F, pus (white drainage) or increased drainage or redness at the wound, or calf pain, call your surgeon's office.   Change dressing  As directed    Scheduling Instructions:   Maintain surgical dressing for 10-14 days, or until follow up in the clinic.   Constipation Prevention  As directed    Scheduling Instructions:   Drink plenty of fluids.  Prune juice may be helpful.  You may use a stool softener, such as Colace (over the counter) 100 mg twice a day.  Use MiraLax (over the counter) for constipation as needed.   Diet - low sodium heart healthy  As directed    Discharge instructions  As directed    Scheduling Instructions:   Maintain surgical dressing for 10-14 days, or until follow up in the clinic. Follow up in 2 weeks at [facility]. Call with any questions or concerns.   Increase activity slowly as tolerated  As directed    TED hose  As directed    Scheduling Instructions:   Use stockings (TED  hose) for 2 weeks on both leg(s).  You may remove them at night for sleeping.   Weight bearing as tolerated  As directed    Questions:     Laterality:     Extremity:          Medication List    STOP taking these medications       aspirin 81 MG tablet     methotrexate 2.5 MG tablet  Commonly known as:  RHEUMATREX     naproxen sodium 220 MG tablet  Commonly known as:  ANAPROX     traMADol 50 MG tablet  Commonly known as:  ULTRAM      TAKE these medications       allopurinol 300 MG tablet  Commonly known as:  ZYLOPRIM  Take 300 mg by mouth daily.     atenolol 50 MG tablet  Commonly known as:  TENORMIN  Take 50 mg by mouth every morning.     atorvastatin 40 MG tablet  Commonly known as:  LIPITOR  Take 40 mg by mouth every evening.     DSS 100 MG Caps  Take 100 mg by mouth 2 (two) times daily.     ferrous sulfate 325 (65 FE) MG tablet  Take 1 tablet (325 mg total) by mouth 2 (two) times daily with a meal.     folic acid 1 MG tablet  Commonly known as:  FOLVITE  Take 1 mg by mouth daily.     HYDROcodone-acetaminophen 7.5-325 MG per tablet  Commonly known as:  NORCO  Take 1-2 tablets by mouth every 4 (four) hours while awake.     pioglitazone 30 MG tablet  Commonly known as:  ACTOS  Take 30 mg by mouth daily.     polyethylene glycol packet  Commonly known as:  MIRALAX / GLYCOLAX  Take 17 g by mouth 2 (two) times daily.     potassium chloride 10 MEQ CR capsule  Commonly known as:  MICRO-K  Take 1 capsule by mouth daily.     ramipril 5 MG capsule  Commonly known as:  ALTACE  TAKE 1 CAPSULE BY MOUTH ONCE DAILY     rivaroxaban 10 MG Tabs tablet  Commonly known as:  XARELTO  Take 1 tablet (10 mg total) by mouth daily with breakfast.     tiZANidine 4 MG capsule  Commonly known as:  ZANAFLEX  Take 1 capsule (4 mg total) by mouth 3 (three) times daily as needed for muscle spasms.     triamterene-hydrochlorothiazide 75-50 MG per tablet  Commonly known as:   MAXZIDE  Take 0.5 tablets by mouth every morning.     Vitamin D 2000 UNITS tablet  Take 2,000 Units by mouth daily.         Signed: West Pugh. Merary Garguilo   PAC  06/25/2013, 7:38 AM

## 2013-07-08 ENCOUNTER — Telehealth: Payer: Self-pay | Admitting: Family Medicine

## 2013-07-08 NOTE — Telephone Encounter (Signed)
Verbal ok given, fax with POC will be faxed in next couple of days.

## 2013-07-08 NOTE — Telephone Encounter (Signed)
Caller name: Roselie Awkward Relation to pt: Roselie Awkward, PT for [facility] Call back [place]   Reason for call:  Roselie Awkward Needs verbal order to continue PT care.  2x for 1 week, 3x for 2 weeks, 2x week for 1 week.  Pt had knee replacement and came out of skilled nursing on 5/25.

## 2013-07-21 ENCOUNTER — Telehealth: Payer: Self-pay | Admitting: *Deleted

## 2013-07-21 NOTE — Telephone Encounter (Signed)
Received home health certification and plan of care via fax from [facility]. Billing sheet attached and placed in folder for Dr. Birdie Riddle. JG//CMA

## 2013-07-22 DIAGNOSIS — Z4789 Encounter for other orthopedic aftercare: Secondary | ICD-10-CM

## 2013-07-22 DIAGNOSIS — E119 Type 2 diabetes mellitus without complications: Secondary | ICD-10-CM

## 2013-07-22 DIAGNOSIS — Z471 Aftercare following joint replacement surgery: Secondary | ICD-10-CM

## 2013-07-22 DIAGNOSIS — R269 Unspecified abnormalities of gait and mobility: Secondary | ICD-10-CM

## 2013-07-22 NOTE — Telephone Encounter (Signed)
Signed paperwork faxed to [facility]. JG//CMA

## 2013-08-05 ENCOUNTER — Telehealth: Payer: Self-pay | Admitting: *Deleted

## 2013-08-05 NOTE — Telephone Encounter (Signed)
Caller name:  Shaquill Relation to pt:  Self  Call back number: [phone]  Pharmacy:  [facility] [address] in [place]  Reason for call:   Pt had knee surgery 06/22/2013 and since has been having severe constipation.  He has used OTC and home remedy's with no relief.  Pt wanted to know if you have any suggestions.  Please advise.  bw

## 2013-08-05 NOTE — Telephone Encounter (Signed)
Spoke with pt who advised he has been using miralax, magnesium citrate, and increased his fluid intake. Per tabori pt was advised to cut back on pain meds if possible and continue the OTC medications. If problem is no better by Monday, pt was instructed to call our office to schedule an appt.

## 2013-08-18 ENCOUNTER — Encounter: Payer: Self-pay | Admitting: Family Medicine

## 2013-08-18 ENCOUNTER — Ambulatory Visit (INDEPENDENT_AMBULATORY_CARE_PROVIDER_SITE_OTHER): Payer: Medicare Other | Admitting: Family Medicine

## 2013-08-18 ENCOUNTER — Telehealth: Payer: Self-pay | Admitting: Family Medicine

## 2013-08-18 VITALS — BP 120/70 | HR 81 | Temp 97.9°F | Resp 16 | Wt 333.1 lb

## 2013-08-18 DIAGNOSIS — K5901 Slow transit constipation: Secondary | ICD-10-CM

## 2013-08-18 MED ORDER — BISACODYL 5 MG PO TBEC: 10.0000 mg | DELAYED_RELEASE_TABLET | ORAL | Status: DC | PRN

## 2013-08-18 MED ORDER — POLYETHYLENE GLYCOL 3350 17 G PO PACK: 17.0000 g | PACK | Freq: Two times a day (BID) | ORAL | Status: DC

## 2013-08-18 NOTE — Progress Notes (Signed)
Pre visit review using our clinic review tool, if applicable. No additional management support is needed unless otherwise documented below in the visit note. 

## 2013-08-18 NOTE — Progress Notes (Signed)
   Subjective:    Patient ID: Arkeem Harts, male    DOB: [birth year], 68 y.o.   MRN: [id number]  HPI Constipation- chronic problem for pt but recently worsened w/ use of pain meds after knee surgery.  Pt was on stool softeners in the hospital.  Started Miralax daily w/o relief.  Calcium Citrate w/o relief.  Started doing Enemas w/ some relief.  5 regular BMs in last month.  Pt is frustrated and feeling bloated and uncomfortable.  No N/V.   Review of Systems For ROS see HPI     Objective:   Physical Exam  Vitals reviewed. Constitutional: He is oriented to person, place, and time. He appears well-developed and well-nourished. No distress.  HENT:  Head: Normocephalic and atraumatic.  Abdominal: Soft. Bowel sounds are normal. He exhibits distension (mild). There is no tenderness. There is no rebound and no guarding.  Neurological: He is alert and oriented to person, place, and time.  Skin: Skin is warm and dry.  Psychiatric: He has a normal mood and affect. His behavior is normal. Thought content normal.          Assessment & Plan:

## 2013-08-18 NOTE — Patient Instructions (Signed)
Follow up as needed Start the Miralax twice daily Add the Bisacodyl every other day as needed for BM Drink plenty of fluids Call with any questions or concerns Hang in there!

## 2013-08-18 NOTE — Assessment & Plan Note (Signed)
Deteriorated.  Reviewed need for regular bowel regimen.  After discussion of options, pt agreeable to Miralax BID w/ Bisacodyl every other day as needed.  If no improvement, pt to let me know so we can send him to GI for additional evaluation.  Pt expressed understanding and is in agreement w/ plan.

## 2013-08-18 NOTE — Telephone Encounter (Signed)
[facility] pharmacy tech called stating that they did not have the  polyethylene glycol (MIRALAX / GLYCOLAX) packet in stock. Per Janett Billow T (CMA) it was okay to give it in a bottle form. [facility] pharmacy stated that they will fill it for one month.

## 2013-08-26 ENCOUNTER — Telehealth: Payer: Self-pay | Admitting: Family Medicine

## 2013-08-26 MED ORDER — TRIAMTERENE-HCTZ 75-50 MG PO TABS: 0.5000 | ORAL_TABLET | Freq: Every morning | ORAL | Status: DC

## 2013-08-26 NOTE — Telephone Encounter (Signed)
Spoke with pt and advised that his meds were filled.

## 2013-08-26 NOTE — Telephone Encounter (Signed)
Caller name: Lar[place]  Relation to pt: Call back [place]: [facility] [place]  Reason for call:  Pt states pharmacy has been trying to get the Rx triamterene-hydrochlorothiazide (MAXZIDE) 75-50 MG per tablet filled.  Pt needs this filled please

## 2013-09-25 LAB — HEMOGLOBIN A1C: Hgb A1c MFr Bld: 6.2 % — AB (ref 4.0–6.0)

## 2013-09-28 ENCOUNTER — Encounter: Payer: Self-pay | Admitting: General Practice

## 2013-10-16 ENCOUNTER — Other Ambulatory Visit: Payer: Self-pay | Admitting: Internal Medicine

## 2013-11-05 ENCOUNTER — Telehealth: Payer: Self-pay | Admitting: Family Medicine

## 2013-11-05 NOTE — Telephone Encounter (Signed)
Unfortunately I am not able to prescribe meds w/o OV (office policy).  I do understand his symptoms and understand that this is an inconvenience but I have to follow the rules.

## 2013-11-05 NOTE — Telephone Encounter (Signed)
Called pt and lmovm to schedule an appt.

## 2013-11-05 NOTE — Telephone Encounter (Signed)
Pt would like to know if dr. Birdie Riddle would call him in a z-pak, states she is aware of his recurrent sinus issues and chest colds. States if he has to be seen he will but he his prone to this every year. Send to [facility] [address]

## 2013-11-06 ENCOUNTER — Encounter: Payer: Self-pay | Admitting: Family Medicine

## 2013-11-06 ENCOUNTER — Ambulatory Visit (INDEPENDENT_AMBULATORY_CARE_PROVIDER_SITE_OTHER): Payer: Medicare Other | Admitting: Family Medicine

## 2013-11-06 VITALS — BP 122/72 | HR 66 | Temp 98.1°F | Resp 17 | Wt 324.5 lb

## 2013-11-06 DIAGNOSIS — J209 Acute bronchitis, unspecified: Secondary | ICD-10-CM

## 2013-11-06 DIAGNOSIS — J0111 Acute recurrent frontal sinusitis: Secondary | ICD-10-CM

## 2013-11-06 DIAGNOSIS — J0110 Acute frontal sinusitis, unspecified: Secondary | ICD-10-CM

## 2013-11-06 MED ORDER — PROMETHAZINE-DM 6.25-15 MG/5ML PO SYRP: 5.0000 mL | ORAL_SOLUTION | Freq: Four times a day (QID) | ORAL | Status: DC | PRN

## 2013-11-06 MED ORDER — AZITHROMYCIN 250 MG PO TABS: ORAL_TABLET | ORAL | Status: DC

## 2013-11-06 MED ORDER — BENZONATATE 200 MG PO CAPS: 200.0000 mg | ORAL_CAPSULE | Freq: Three times a day (TID) | ORAL | Status: DC | PRN

## 2013-11-06 NOTE — Telephone Encounter (Signed)
Pt has appt at 11:00 on 11/06/13

## 2013-11-06 NOTE — Progress Notes (Signed)
Pre visit review using our clinic review tool, if applicable. No additional management support is needed unless otherwise documented below in the visit note. 

## 2013-11-06 NOTE — Patient Instructions (Signed)
Follow up as needed Start the [place].  Refill as needed Use the Tessalon for daytime cough Promethazine cough syrup for night Drink plenty of fluids REST! Call with any questions or concerns Have a safe trip!!!

## 2013-11-06 NOTE — Progress Notes (Signed)
   Subjective:    Patient ID: Gary Butler, male    DOB: [birth year], 68 y.o.   MRN: [id number]  HPI URI- sxs started 1 week ago.  sxs have worsened.  Cough is now productive of white sputum.  Mild sinus pressure.  + HA.  No ear pain.  No sore throat.  No N/V/D.  No fevers.  + sick contacts.  + SOB w/ some wheezing.  Due to pt's allergies, he typically takes Zpack w/ 1 refill   Review of Systems For ROS see HPI     Objective:   Physical Exam  Vitals reviewed. Constitutional: He appears well-developed and well-nourished. No distress.  HENT:  Head: Normocephalic and atraumatic.  Right Ear: Tympanic membrane normal.  Left Ear: Tympanic membrane normal.  Nose: Mucosal edema and rhinorrhea present. Right sinus exhibits maxillary sinus tenderness and frontal sinus tenderness. Left sinus exhibits maxillary sinus tenderness and frontal sinus tenderness.  Mouth/Throat: Mucous membranes are normal. Oropharyngeal exudate and posterior oropharyngeal erythema present. No posterior oropharyngeal edema.  + PND  Eyes: Conjunctivae and EOM are normal. Pupils are equal, round, and reactive to light.  Neck: Normal range of motion. Neck supple.  Cardiovascular: Normal rate, regular rhythm and normal heart sounds.   Pulmonary/Chest: Effort normal and breath sounds normal. No respiratory distress. He has no wheezes.  + hacking cough  Lymphadenopathy:    He has no cervical adenopathy.  Skin: Skin is warm and dry.          Assessment & Plan:

## 2013-11-07 NOTE — Assessment & Plan Note (Signed)
Pt's sxs and PE consistent w/ infxn.  Start abx.  Reviewed supportive care and red flags that should prompt return.  Pt expressed understanding and is in agreement w/ plan.  

## 2013-11-07 NOTE — Assessment & Plan Note (Addendum)
Pt has hx of similar.  Start Zpack.  Cough meds prn.  Reviewed supportive care and red flags that should prompt return.  Pt expressed understanding and is in agreement w/ plan.

## 2013-11-27 ENCOUNTER — Ambulatory Visit: Payer: Medicare Other | Admitting: Physician Assistant

## 2013-11-27 ENCOUNTER — Telehealth: Payer: Self-pay | Admitting: *Deleted

## 2013-11-27 DIAGNOSIS — Z0289 Encounter for other administrative examinations: Secondary | ICD-10-CM

## 2013-11-27 NOTE — Telephone Encounter (Signed)
Pt did not show for appointment 11/27/2013 at 10:45am for AWV

## 2013-11-27 NOTE — Telephone Encounter (Signed)
Scheduled AWV today at 11:40am with Percell Miller for 12/04/2013

## 2013-12-04 ENCOUNTER — Ambulatory Visit (INDEPENDENT_AMBULATORY_CARE_PROVIDER_SITE_OTHER): Payer: Medicare Other | Admitting: Medical

## 2013-12-04 ENCOUNTER — Encounter: Payer: Self-pay | Admitting: Medical

## 2013-12-04 VITALS — BP 120/74 | HR 63 | Temp 97.6°F | Ht 71.0 in | Wt 328.6 lb

## 2013-12-04 DIAGNOSIS — E785 Hyperlipidemia, unspecified: Secondary | ICD-10-CM

## 2013-12-04 DIAGNOSIS — I10 Essential (primary) hypertension: Secondary | ICD-10-CM | POA: Diagnosis not present

## 2013-12-04 DIAGNOSIS — Z0000 Encounter for general adult medical examination without abnormal findings: Secondary | ICD-10-CM | POA: Insufficient documentation

## 2013-12-04 DIAGNOSIS — Z0189 Encounter for other specified special examinations: Secondary | ICD-10-CM | POA: Diagnosis not present

## 2013-12-04 DIAGNOSIS — Z23 Encounter for immunization: Secondary | ICD-10-CM

## 2013-12-04 LAB — CBC WITH DIFFERENTIAL/PLATELET
Basophils Absolute: 0 K/uL (ref 0.0–0.1)
Basophils Relative: 0.3 % (ref 0.0–3.0)
Eosinophils Absolute: 0.3 K/uL (ref 0.0–0.7)
Eosinophils Relative: 4.7 % (ref 0.0–5.0)
HCT: 44.6 % (ref 39.0–52.0)
Hemoglobin: 14.6 g/dL (ref 13.0–17.0)
Lymphocytes Relative: 12.6 % (ref 12.0–46.0)
Lymphs Abs: 0.9 K/uL (ref 0.7–4.0)
MCHC: 32.6 g/dL (ref 30.0–36.0)
MCV: 93.6 fl (ref 78.0–100.0)
Monocytes Absolute: 0.4 K/uL (ref 0.1–1.0)
Monocytes Relative: 6.2 % (ref 3.0–12.0)
Neutro Abs: 5.5 K/uL (ref 1.4–7.7)
Neutrophils Relative %: 76.2 % (ref 43.0–77.0)
Platelets: 203 K/uL (ref 150.0–400.0)
RBC: 4.76 Mil/uL (ref 4.22–5.81)
RDW: 17 % — ABNORMAL HIGH (ref 11.5–15.5)
WBC: 7.2 K/uL (ref 4.0–10.5)

## 2013-12-04 LAB — COMPREHENSIVE METABOLIC PANEL
ALT: 20 U/L (ref 0–53)
AST: 23 U/L (ref 0–37)
Albumin: 3.1 g/dL — ABNORMAL LOW (ref 3.5–5.2)
Alkaline Phosphatase: 91 U/L (ref 39–117)
BUN: 15 mg/dL (ref 6–23)
CO2: 25 mEq/L (ref 19–32)
Calcium: 8.9 mg/dL (ref 8.4–10.5)
Chloride: 101 mEq/L (ref 96–112)
Creatinine, Ser: 0.9 mg/dL (ref 0.4–1.5)
GFR: 91.25 mL/min (ref >60.00)
Glucose, Bld: 129 mg/dL — ABNORMAL HIGH (ref 70–99)
Potassium: 3.5 mEq/L (ref 3.5–5.1)
Sodium: 136 mEq/L (ref 135–145)
Total Bilirubin: 1.5 mg/dL — ABNORMAL HIGH (ref 0.2–1.2)
Total Protein: 6.5 g/dL (ref 6.0–8.3)

## 2013-12-04 LAB — LIPID PANEL
Cholesterol: 95 mg/dL (ref 0–200)
HDL: 40.4 mg/dL (ref >39.00)
LDL Cholesterol: 43 mg/dL (ref 0–99)
NonHDL: 54.6
Total CHOL/HDL Ratio: 2
Triglycerides: 59 mg/dL (ref 0.0–149.0)
VLDL: 11.8 mg/dL (ref 0.0–40.0)

## 2013-12-04 MED ORDER — TETANUS-DIPHTH-ACELL PERTUSSIS 5-2-15.5 LF-MCG/0.5 IM SUSP: 0.5000 mL | Freq: Once | INTRAMUSCULAR | Status: DC

## 2013-12-04 NOTE — Progress Notes (Signed)
Pre visit review using our clinic review tool, if applicable. No additional management support is needed unless otherwise documented below in the visit note. 

## 2013-12-04 NOTE — Progress Notes (Signed)
Subjective:    Patient ID: Gary Butler, male    DOB: 05/16/1945, 68 y.o.   MRN: [id number]  HPI  Pt in for AVS.  Colonoscopy done 05-26-2012. Repeat in 5 yrs. Colon ademonas. Banding hemorrhoids.  Lipid 03-2013 Ldl 35 and hdl 35.  Diabetes- Last a1-c 6.2(09-25-2013). BS level 136.  CT of chest 10-2010. Screening normal.  BMI 47.89.  Pt up to date on vaccines. We will print out tetanus order to be done with pharmacy.   No smoker.  Pt has no problems urinating. No obstructive flow.  Last bmp in may.  Pt saw urologist around June or July. Psa per pt normal and prostate exam was negative.        Review of Systems See ros medicare smart set.    Objective:   Physical Exam  See pe medicare smart set.        Assessment & Plan:   Subjective:    Gary Butler is a 68 y.o. male who presents for Medicare Annual/Subsequent preventive examination.   Preventive Screening-Counseling & Management  Tobacco History  Smoking status  . Never Smoker   Smokeless tobacco  . Not on file    Problems Prior to Visit 1. None reported  Current Problems (verified) Patient Active Problem List   Diagnosis Date Noted  . Constipation due to slow transit 08/18/2013  . Morbid obesity 06/23/2013  . S/P left TKA 06/22/2013  . Meniere disease 04/21/2013  . Benign paroxysmal positional vertigo 04/21/2013  . Psoriatic arthritis 05/14/2012  . General medical exam 01/28/2012  . Cellulitis of eyelid 09/25/2011  . Diverticulitis 06/20/2011  . Erythrocytosis 05/18/2011  . Sinusitis, acute 03/14/2011  . S/P TKR (total knee replacement) 12/31/2010  . Obesity 12/31/2010  . Hyperlipidemia 12/22/2010  . BPH (benign prostatic hyperplasia) 12/22/2010  . Gout 12/22/2010  . DM 04/04/2009  . OBSTRUCTIVE SLEEP APNEA 04/04/2009  . HYPERTENSION 04/04/2009    Medications Prior to Visit Current Outpatient Prescriptions on File Prior to Visit  Medication Sig Dispense Refill  . allopurinol  (ZYLOPRIM) 300 MG tablet Take 300 mg by mouth daily.      Marland Kitchen allopurinol (ZYLOPRIM) 300 MG tablet TAKE 1 TABLET BY MOUTH DAILY  30 tablet  1  . atenolol (TENORMIN) 50 MG tablet Take 50 mg by mouth every morning.       Marland Kitchen atorvastatin (LIPITOR) 40 MG tablet Take 40 mg by mouth every evening.       . bisacodyl (DULCOLAX) 5 MG EC tablet Take 2 tablets (10 mg total) by mouth every other day as needed for moderate constipation.  60 tablet  1  . Cholecalciferol (VITAMIN D) 2000 UNITS tablet Take 2,000 Units by mouth daily.      . folic acid (FOLVITE) 1 MG tablet Take 1 mg by mouth daily.       Marland Kitchen HYDROcodone-acetaminophen (NORCO) 7.5-325 MG per tablet Take 1-2 tablets by mouth every 4 (four) hours while awake.  30 tablet  0  . methotrexate (RHEUMATREX) 2.5 MG tablet       . pioglitazone (ACTOS) 30 MG tablet Take 30 mg by mouth daily.        . potassium chloride (MICRO-K) 10 MEQ CR capsule Take 1 capsule by mouth daily.      . ramipril (ALTACE) 5 MG capsule TAKE 1 CAPSULE BY MOUTH ONCE DAILY  30 capsule  6  . triamterene-hydrochlorothiazide (MAXZIDE) 75-50 MG per tablet Take 0.5 tablets by mouth every morning.  30 tablet  6   No current facility-administered medications on file prior to visit.    Current Medications (verified) Current Outpatient Prescriptions  Medication Sig Dispense Refill  . allopurinol (ZYLOPRIM) 300 MG tablet Take 300 mg by mouth daily.      Marland Kitchen allopurinol (ZYLOPRIM) 300 MG tablet TAKE 1 TABLET BY MOUTH DAILY  30 tablet  1  . atenolol (TENORMIN) 50 MG tablet Take 50 mg by mouth every morning.       Marland Kitchen atorvastatin (LIPITOR) 40 MG tablet Take 40 mg by mouth every evening.       . bisacodyl (DULCOLAX) 5 MG EC tablet Take 2 tablets (10 mg total) by mouth every other day as needed for moderate constipation.  60 tablet  1  . Cholecalciferol (VITAMIN D) 2000 UNITS tablet Take 2,000 Units by mouth daily.      . folic acid (FOLVITE) 1 MG tablet Take 1 mg by mouth daily.       Marland Kitchen  HYDROcodone-acetaminophen (NORCO) 7.5-325 MG per tablet Take 1-2 tablets by mouth every 4 (four) hours while awake.  30 tablet  0  . methotrexate (RHEUMATREX) 2.5 MG tablet       . pioglitazone (ACTOS) 30 MG tablet Take 30 mg by mouth daily.        . potassium chloride (MICRO-K) 10 MEQ CR capsule Take 1 capsule by mouth daily.      . ramipril (ALTACE) 5 MG capsule TAKE 1 CAPSULE BY MOUTH ONCE DAILY  30 capsule  6  . triamterene-hydrochlorothiazide (MAXZIDE) 75-50 MG per tablet Take 0.5 tablets by mouth every morning.  30 tablet  6   No current facility-administered medications for this visit.     Allergies (verified) Lyrica; Penicillins; and Levofloxacin   PAST HISTORY  Family History Family History  Problem Relation Age of Onset  . Emphysema Father   . Clotting disorder Father   . Arthritis Father   . Heart disease Mother   . Arthritis Mother   . Diabetes Maternal Aunt   . Diabetes Paternal Aunt   . Arthritis Maternal Grandmother   . Hypertension Maternal Grandmother   . Diabetes Maternal Grandmother   . Arthritis Paternal Grandmother     Social History History  Substance Use Topics  . Smoking status: Never Smoker   . Smokeless tobacco: Not on file  . Alcohol Use: Yes     Comment: rare    Are there smokers in your home (other than you)?  No  Risk Factors Current exercise habits: Pt walks around house. Occasional silver sneakers. 2-3 times a week. Dietary issues discussed:   Cardiac risk factors: diabetes, hyperlipidemia. htn, age..  Depression Screen (Note: if answer to either of the following is "Yes", a more complete depression screening is indicated)   Q1: Over the past two weeks, have you felt down, depressed or hopeless? No  Q2: Over the past two weeks, have you felt little interest or pleasure in doing things? No  Have you lost interest or pleasure in daily life? No  Do you often feel hopeless? No  Do you cry easily over simple problems? No  Activities  of Daily Living In your present state of health, do you have any difficulty performing the following activities?:  Driving? No Managing money?  No Feeding yourself? No Getting from bed to chair? No Climbing a flight of stairs? Yes.Has to go slow due to recent knee surgery. Preparing food and eating?: No Bathing or showering? No Getting dressed: No Getting to  the toilet? No Using the toilet:No Moving around from place to place: No In the past year have you fallen or had a near fall?:No   Are you sexually active?  Yes  Do you have more than one partner?  No  Hearing Difficulties: No(when he uses his hearing aids.) Do you often ask people to speak up or repeat themselves? No Do you experience ringing or noises in your ears? Yes Do you have difficulty understanding soft or whispered voices? No   Do you feel that you have a problem with memory? Yes  Do you often misplace items? No  Do you feel safe at home?  Yes  Cognitive Testing  Alert? Yes  Normal Appearance?Yes  Oriented to person? Yes  Place? Yes   Time? Yes  Recall of three objects?  Yes  Can perform simple calculations? Yes  Displays appropriate judgment?Yes  Can read the correct time from a watch face?Yes   Advanced Directives have been discussed with the patient? Yes   List the Names of Other Physician/Practitioners you currently use: 1.  Derm- Dr. Renda Rolls.  Endocrinologist-[facility]  Podiatrist- [facility]. Audiologist- [facility]. [facility]. Opthalmologist- Dr. Ozella Almond any recent Medical Services you may have received from other than [facility] providers in the past year (date may be approximate).  Immunization History  Administered Date(s) Administered  . Influenza,inj,Quad PF,36+ Mos 12/04/2013  . Influenza-Unspecified 11/22/2010, 12/14/2011, 11/11/2012  . Pneumococcal Polysaccharide-23 03/18/2013  . Pneumococcal-Unspecified 10/27/2008  . Zoster 11/23/2009    Screening Tests Health Maintenance  Topic  Date Due  . Tetanus/tdap  12/05/2014 (Originally 11/21/1964)  . Urine Microalbumin  03/26/2014  . Hemoglobin A1c  03/28/2014  . Ophthalmology Exam  05/14/2014  . Influenza Vaccine  09/13/2014  . Foot Exam  09/26/2014  . Colonoscopy  05/27/2022  . Pneumococcal Polysaccharide Vaccine Age 55 And Over  Completed  . Zostavax  Completed    All answers were reviewed with the patient and necessary referrals were made:  Mackie Pai, PA-C   12/04/2013   History reviewed: allergies, current medications, past family history, past medical history, past social history, past surgical history and problem list  Review of Systems A comprehensive review of systems was negative.    Objective:    Vision by Snellen chart: right VWU:[id number] declines measurement, left NWG:NFAOZHY declines measurement Blood pressure 120/74, pulse 63, temperature 97.6 F (36.4 C), temperature source Oral, height 5\' 11"  (1.803 m), weight 328 lb 9.6 oz (149.052 kg), SpO2 97.00%. Body mass index is 45.85 kg/(m^2).  {Exam, QMVH:84696}   General Mental Status- Alert. Orientation- Oriented x3.  Build and Nutrition- Well nourished and Well Developed.  Skin General:-Normal. Color- Normal color. Moisture- Normal. Temperature-Warm.  HEENT  Ears- Normal. Auditory Canal- Bilateral-Normal. Tympanic Membrane- Bilateral-Normal. Eye Fundi-Bilateral-Normal. Pupil- bilateral- Direct reaction to light normal. Nose & Sinuses- Normal. Nostrils-Bilateral- Normal. Mouth & Throat-Normal.  Neck Neck- No Bruits or Masses. Trachea midline.  Thyroid- Normal.  Chest and Lung Exam Percussion: Quality and Intensity-Percussion normal. Percussion of the chest reveals- No Dullness.  Palpation: Palpation of the chest reveals- Non-tender- No dullness. Auscultation: Breath Sounds- Normal.  Adventitous Sounds:-No adventitious sounds.  Cardiovascular Inspection:- No Heaves. Auscultation:-Normal sinus rhythm without murmur gallop, S1  WNL and S2 WNL.  Abdomen Inspection:-Inspection Normal. Inspection of the abdomen reveals- No hernias Palpation/Percussion:- Palpation and Percussion of the Abdomen reveal- Non Tender and No Palpable abdominal masses. Liver: Other Characteristics- No hepatomegaly. Spleen:Other Characteristics- No Splenomegaly. Auscultation:- Auscultation of the abdomen reveals- Bowel  sounds normal and No Abdominal bruits.  Male Genitourinary Saw urologist early summer.  Rectal Pt up to date on colonoscopy and saw urologist recently. He has fissure that makes exam painful. He deferred exam and I think is reasonable.  Peripheral  Vascular Lower Extremity:Inspection- Bilateral-Inspection Normal  Palpation: Femoral pulse- Bilateral- 2+. Popliteal pulse- Bilateral-2+. Dorsalis pedis pulse- Bilateral- 1/2+. Edema- Bilateral- No edema.  Neurologic Mental Status:- Normal. Cranial Nerves:-Normal Bilaterally. Motor:-Normal. Strength:5/5 normal muscle strength-All Muscles. General Assessment of Reflexes: Right Knee-2+. Left Knee- 2+. Coordination-Normal. Gait- Normal.  Meningeal Signs- None.  Musculoskeletal Global Assessment General-Joints show full range of motion without obvious deformity and Normal muscle mass. Strength in upper and lower extremities.  Lymphatics General lymphatics Description- No generalized lymphadenopathy.   Assessment:    Patient presents for yearly preventative medicine examination. Medicare questionnaire was completed  All immunizations and health maintenance protocols were reviewed with the patient and needed orders were placed.  Appropriate screening laboratory values were ordered for the patient including screening of hyperlipidemia, renal function and hepatic function. If indicated by BPH, a PSA was ordered.  Medication reconciliation,  past medical history, social history, problem list and allergies were reviewed in detail with the patient  Goals were established  with regard to weight loss, exercise, and  diet in compliance with medications  End of life planning was discussed.     Plan:    During the course of the visit the patient was educated and counseled about appropriate screening and preventive services including:    Influenza vaccine  Td vaccine  Diet review for nutrition referral? Yes ____  Not Indicated _x__   Patient Instructions (the written plan) was given to the patient.  Medicare Attestation I have personally reviewed: The patient's medical and social history Their use of alcohol, tobacco or illicit drugs Their current medications and supplements The patient's functional ability including ADLs,fall risks, home safety risks, cognitive, and hearing and visual impairment Diet and physical activities Evidence for depression or mood disorders  The patient's weight, height, BMI, and visual acuity have been recorded in the chart.  I have made referrals, counseling, and provided education to the patient based on review of the above and I have provided the patient with a written personalized care plan for preventive services.     Mackie Pai, PA-C   12/04/2013

## 2013-12-04 NOTE — Assessment & Plan Note (Signed)
cmp and lipid panel today and will review results to see if needs change.

## 2013-12-04 NOTE — Assessment & Plan Note (Signed)
Will get cmp, cbc and lipid panel fasting. Pt given tetanus order to have done at pharmacy. Encouraged get more exercise as tolerated Fluvaccine given today. [id number] done as well.

## 2013-12-04 NOTE — Patient Instructions (Addendum)
Preventive Care for Adults A healthy lifestyle and preventive care can promote health and wellness. Preventive health guidelines for men include the following key practices:  A routine yearly physical is a good way to check with your health care provider about your health and preventative screening. It is a chance to share any concerns and updates on your health and to receive a thorough exam.  Visit your dentist for a routine exam and preventative care every 6 months. Brush your teeth twice a day and floss once a day. Good oral hygiene prevents tooth decay and gum disease.  The frequency of eye exams is based on your age, health, family medical history, use of contact lenses, and other factors. Follow your health care provider's recommendations for frequency of eye exams.  Eat a healthy diet. Foods such as vegetables, fruits, whole grains, low-fat dairy products, and lean protein foods contain the nutrients you need without too many calories. Decrease your intake of foods high in solid fats, added sugars, and salt. Eat the right amount of calories for you.Get information about a proper diet from your health care provider, if necessary.  Regular physical exercise is one of the most important things you can do for your health. Most adults should get at least 150 minutes of moderate-intensity exercise (any activity that increases your heart rate and causes you to sweat) each week. In addition, most adults need muscle-strengthening exercises on 2 or more days a week.  Maintain a healthy weight. The body mass index (BMI) is a screening tool to identify possible weight problems. It provides an estimate of body fat based on height and weight. Your health care provider can find your BMI and can help you achieve or maintain a healthy weight.For adults 20 years and older:  A BMI below 18.5 is considered underweight.  A BMI of 18.5 to 24.9 is normal.  A BMI of 25 to 29.9 is considered overweight.  A BMI  of 30 and above is considered obese.  Maintain normal blood lipids and cholesterol levels by exercising and minimizing your intake of saturated fat. Eat a balanced diet with plenty of fruit and vegetables. Blood tests for lipids and cholesterol should begin at age 50 and be repeated every 5 years. If your lipid or cholesterol levels are high, you are over 50, or you are at high risk for heart disease, you may need your cholesterol levels checked more frequently.Ongoing high lipid and cholesterol levels should be treated with medicines if diet and exercise are not working.  If you smoke, find out from your health care provider how to quit. If you do not use tobacco, do not start.  Lung cancer screening is recommended for adults aged 73-80 years who are at high risk for developing lung cancer because of a history of smoking. A yearly low-dose CT scan of the lungs is recommended for people who have at least a 30-pack-year history of smoking and are a current smoker or have quit within the past 15 years. A pack year of smoking is smoking an average of 1 pack of cigarettes a day for 1 year (for example: 1 pack a day for 30 years or 2 packs a day for 15 years). Yearly screening should continue until the smoker has stopped smoking for at least 15 years. Yearly screening should be stopped for people who develop a health problem that would prevent them from having lung cancer treatment.  If you choose to drink alcohol, do not have more than  2 drinks per day. One drink is considered to be 12 ounces (355 mL) of beer, 5 ounces (148 mL) of wine, or 1.5 ounces (44 mL) of liquor.  Avoid use of street drugs. Do not share needles with anyone. Ask for help if you need support or instructions about stopping the use of drugs.  High blood pressure causes heart disease and increases the risk of stroke. Your blood pressure should be checked at least every 1-2 years. Ongoing high blood pressure should be treated with  medicines, if weight loss and exercise are not effective.  If you are 45-79 years old, ask your health care provider if you should take aspirin to prevent heart disease.  Diabetes screening involves taking a blood sample to check your fasting blood sugar level. This should be done once every 3 years, after age 45, if you are within normal weight and without risk factors for diabetes. Testing should be considered at a younger age or be carried out more frequently if you are overweight and have at least 1 risk factor for diabetes.  Colorectal cancer can be detected and often prevented. Most routine colorectal cancer screening begins at the age of 50 and continues through age 75. However, your health care provider may recommend screening at an earlier age if you have risk factors for colon cancer. On a yearly basis, your health care provider may provide home test kits to check for hidden blood in the stool. Use of a small camera at the end of a tube to directly examine the colon (sigmoidoscopy or colonoscopy) can detect the earliest forms of colorectal cancer. Talk to your health care provider about this at age 50, when routine screening begins. Direct exam of the colon should be repeated every 5-10 years through age 75, unless early forms of precancerous polyps or small growths are found.  People who are at an increased risk for hepatitis B should be screened for this virus. You are considered at high risk for hepatitis B if:  You were born in a country where hepatitis B occurs often. Talk with your health care provider about which countries are considered high risk.  Your parents were born in a high-risk country and you have not received a shot to protect against hepatitis B (hepatitis B vaccine).  You have HIV or AIDS.  You use needles to inject street drugs.  You live with, or have sex with, someone who has hepatitis B.  You are a man who has sex with other men (MSM).  You get hemodialysis  treatment.  You take certain medicines for conditions such as cancer, organ transplantation, and autoimmune conditions.  Hepatitis C blood testing is recommended for all people born from 1945 through 1965 and any individual with known risks for hepatitis C.  Practice safe sex. Use condoms and avoid high-risk sexual practices to reduce the spread of sexually transmitted infections (STIs). STIs include gonorrhea, chlamydia, syphilis, trichomonas, herpes, HPV, and human immunodeficiency virus (HIV). Herpes, HIV, and HPV are viral illnesses that have no cure. They can result in disability, cancer, and death.  If you are at risk of being infected with HIV, it is recommended that you take a prescription medicine daily to prevent HIV infection. This is called preexposure prophylaxis (PrEP). You are considered at risk if:  You are a man who has sex with other men (MSM) and have other risk factors.  You are a heterosexual man, are sexually active, and are at increased risk for HIV infection.    You take drugs by injection.  You are sexually active with a partner who has HIV.  Talk with your health care provider about whether you are at high risk of being infected with HIV. If you choose to begin PrEP, you should first be tested for HIV. You should then be tested every 3 months for as long as you are taking PrEP.  A one-time screening for abdominal aortic aneurysm (AAA) and surgical repair of large AAAs by ultrasound are recommended for men ages 32 to 67 years who are current or former smokers.  Healthy men should no longer receive prostate-specific antigen (PSA) blood tests as part of routine cancer screening. Talk with your health care provider about prostate cancer screening.  Testicular cancer screening is not recommended for adult males who have no symptoms. Screening includes self-exam, a health care provider exam, and other screening tests. Consult with your health care provider about any symptoms  you have or any concerns you have about testicular cancer.  Use sunscreen. Apply sunscreen liberally and repeatedly throughout the day. You should seek shade when your shadow is shorter than you. Protect yourself by wearing long sleeves, pants, a wide-brimmed hat, and sunglasses year round, whenever you are outdoors.  Once a month, do a whole-body skin exam, using a mirror to look at the skin on your back. Tell your health care provider about new moles, moles that have irregular borders, moles that are larger than a pencil eraser, or moles that have changed in shape or color.  Stay current with required vaccines (immunizations).  Influenza vaccine. All adults should be immunized every year.  Tetanus, diphtheria, and acellular pertussis (Td, Tdap) vaccine. An adult who has not previously received Tdap or who does not know his vaccine status should receive 1 dose of Tdap. This initial dose should be followed by tetanus and diphtheria toxoids (Td) booster doses every 10 years. Adults with an unknown or incomplete history of completing a 3-dose immunization series with Td-containing vaccines should begin or complete a primary immunization series including a Tdap dose. Adults should receive a Td booster every 10 years.  Varicella vaccine. An adult without evidence of immunity to varicella should receive 2 doses or a second dose if he has previously received 1 dose.  Human papillomavirus (HPV) vaccine. Males aged 68-21 years who have not received the vaccine previously should receive the 3-dose series. Males aged 22-26 years may be immunized. Immunization is recommended through the age of 6 years for any male who has sex with males and did not get any or all doses earlier. Immunization is recommended for any person with an immunocompromised condition through the age of 49 years if he did not get any or all doses earlier. During the 3-dose series, the second dose should be obtained 4-8 weeks after the first  dose. The third dose should be obtained 24 weeks after the first dose and 16 weeks after the second dose.  Zoster vaccine. One dose is recommended for adults aged 50 years or older unless certain conditions are present.  Measles, mumps, and rubella (MMR) vaccine. Adults born before [birth year] generally are considered immune to measles and mumps. Adults born in [birth year] or later should have 1 or more doses of MMR vaccine unless there is a contraindication to the vaccine or there is laboratory evidence of immunity to each of the three diseases. A routine second dose of MMR vaccine should be obtained at least 28 days after the first dose for students attending postsecondary  schools, health care workers, or international travelers. People who received inactivated measles vaccine or an unknown type of measles vaccine during 1963-1967 should receive 2 doses of MMR vaccine. People who received inactivated mumps vaccine or an unknown type of mumps vaccine before 1979 and are at high risk for mumps infection should consider immunization with 2 doses of MMR vaccine. Unvaccinated health care workers born before 1957 who lack laboratory evidence of measles, mumps, or rubella immunity or laboratory confirmation of disease should consider measles and mumps immunization with 2 doses of MMR vaccine or rubella immunization with 1 dose of MMR vaccine.  Pneumococcal 13-valent conjugate (PCV13) vaccine. When indicated, a person who is uncertain of his immunization history and has no record of immunization should receive the PCV13 vaccine. An adult aged 19 years or older who has certain medical conditions and has not been previously immunized should receive 1 dose of PCV13 vaccine. This PCV13 should be followed with a dose of pneumococcal polysaccharide (PPSV23) vaccine. The PPSV23 vaccine dose should be obtained at least 8 weeks after the dose of PCV13 vaccine. An adult aged 19 years or older who has certain medical conditions and  previously received 1 or more doses of PPSV23 vaccine should receive 1 dose of PCV13. The PCV13 vaccine dose should be obtained 1 or more years after the last PPSV23 vaccine dose.  Pneumococcal polysaccharide (PPSV23) vaccine. When PCV13 is also indicated, PCV13 should be obtained first. All adults aged 65 years and older should be immunized. An adult younger than age 65 years who has certain medical conditions should be immunized. Any person who resides in a nursing home or long-term care facility should be immunized. An adult smoker should be immunized. People with an immunocompromised condition and certain other conditions should receive both PCV13 and PPSV23 vaccines. People with human immunodeficiency virus (HIV) infection should be immunized as soon as possible after diagnosis. Immunization during chemotherapy or radiation therapy should be avoided. Routine use of PPSV23 vaccine is not recommended for American Indians, [place] Natives, or people younger than 65 years unless there are medical conditions that require PPSV23 vaccine. When indicated, people who have unknown immunization and have no record of immunization should receive PPSV23 vaccine. One-time revaccination 5 years after the first dose of PPSV23 is recommended for people aged 19-64 years who have chronic kidney failure, nephrotic syndrome, asplenia, or immunocompromised conditions. People who received 1-2 doses of PPSV23 before age 65 years should receive another dose of PPSV23 vaccine at age 65 years or later if at least 5 years have passed since the previous dose. Doses of PPSV23 are not needed for people immunized with PPSV23 at or after age 65 years.  Meningococcal vaccine. Adults with asplenia or persistent complement component deficiencies should receive 2 doses of quadrivalent meningococcal conjugate (MenACWY-D) vaccine. The doses should be obtained at least 2 months apart. Microbiologists working with certain meningococcal bacteria,  military recruits, people at risk during an outbreak, and people who travel to or live in countries with a high rate of meningitis should be immunized. A first-year college student up through age 21 years who is living in a residence hall should receive a dose if he did not receive a dose on or after his 16th birthday. Adults who have certain high-risk conditions should receive one or more doses of vaccine.  Hepatitis A vaccine. Adults who wish to be protected from this disease, have certain high-risk conditions, work with hepatitis A-infected animals, work in hepatitis A research labs, or   travel to or work in countries with a high rate of hepatitis A should be immunized. Adults who were previously unvaccinated and who anticipate close contact with an international adoptee during the first 60 days after arrival in the [place] from a country with a high rate of hepatitis A should be immunized.  Hepatitis B vaccine. Adults should be immunized if they wish to be protected from this disease, have certain high-risk conditions, may be exposed to blood or other infectious body fluids, are household contacts or sex partners of hepatitis B positive people, are clients or workers in certain care facilities, or travel to or work in countries with a high rate of hepatitis B.  Haemophilus influenzae type b (Hib) vaccine. A previously unvaccinated person with asplenia or sickle cell disease or having a scheduled splenectomy should receive 1 dose of Hib vaccine. Regardless of previous immunization, a recipient of a hematopoietic stem cell transplant should receive a 3-dose series 6-12 months after his successful transplant. Hib vaccine is not recommended for adults with HIV infection. Preventive Service / Frequency Ages 52 to 17  Blood pressure check.** / Every 1 to 2 years.  Lipid and cholesterol check.** / Every 5 years beginning at age 69.  Hepatitis C blood test.** / For any individual with known risks for  hepatitis C.  Skin self-exam. / Monthly.  Influenza vaccine. / Every year.  Tetanus, diphtheria, and acellular pertussis (Tdap, Td) vaccine.** / Consult your health care provider. 1 dose of Td every 10 years.  Varicella vaccine.** / Consult your health care provider.  HPV vaccine. / 3 doses over 6 months, if 72 or younger.  Measles, mumps, rubella (MMR) vaccine.** / You need at least 1 dose of MMR if you were born in 1957 or later. You may also need a second dose.  Pneumococcal 13-valent conjugate (PCV13) vaccine.** / Consult your health care provider.  Pneumococcal polysaccharide (PPSV23) vaccine.** / 1 to 2 doses if you smoke cigarettes or if you have certain conditions.  Meningococcal vaccine.** / 1 dose if you are age 35 to 60 years and a Market researcher living in a residence hall, or have one of several medical conditions. You may also need additional booster doses.  Hepatitis A vaccine.** / Consult your health care provider.  Hepatitis B vaccine.** / Consult your health care provider.  Haemophilus influenzae type b (Hib) vaccine.** / Consult your health care provider. Ages 35 to 8  Blood pressure check.** / Every 1 to 2 years.  Lipid and cholesterol check.** / Every 5 years beginning at age 57.  Lung cancer screening. / Every year if you are aged 44-80 years and have a 30-pack-year history of smoking and currently smoke or have quit within the past 15 years. Yearly screening is stopped once you have quit smoking for at least 15 years or develop a health problem that would prevent you from having lung cancer treatment.  Fecal occult blood test (FOBT) of stool. / Every year beginning at age 55 and continuing until age 73. You may not have to do this test if you get a colonoscopy every 10 years.  Flexible sigmoidoscopy** or colonoscopy.** / Every 5 years for a flexible sigmoidoscopy or every 10 years for a colonoscopy beginning at age 28 and continuing until age  1.  Hepatitis C blood test.** / For all people born from [birth year] through 1965 and any individual with known risks for hepatitis C.  Skin self-exam. / Monthly.  Influenza vaccine. / Every  year.  Tetanus, diphtheria, and acellular pertussis (Tdap/Td) vaccine.** / Consult your health care provider. 1 dose of Td every 10 years.  Varicella vaccine.** / Consult your health care provider.  Zoster vaccine.** / 1 dose for adults aged 77 years or older.  Measles, mumps, rubella (MMR) vaccine.** / You need at least 1 dose of MMR if you were born in 1957 or later. You may also need a second dose.  Pneumococcal 13-valent conjugate (PCV13) vaccine.** / Consult your health care provider.  Pneumococcal polysaccharide (PPSV23) vaccine.** / 1 to 2 doses if you smoke cigarettes or if you have certain conditions.  Meningococcal vaccine.** / Consult your health care provider.  Hepatitis A vaccine.** / Consult your health care provider.  Hepatitis B vaccine.** / Consult your health care provider.  Haemophilus influenzae type b (Hib) vaccine.** / Consult your health care provider. Ages 77 and over  Blood pressure check.** / Every 1 to 2 years.  Lipid and cholesterol check.**/ Every 5 years beginning at age 40.  Lung cancer screening. / Every year if you are aged 20-80 years and have a 30-pack-year history of smoking and currently smoke or have quit within the past 15 years. Yearly screening is stopped once you have quit smoking for at least 15 years or develop a health problem that would prevent you from having lung cancer treatment.  Fecal occult blood test (FOBT) of stool. / Every year beginning at age 71 and continuing until age 32. You may not have to do this test if you get a colonoscopy every 10 years.  Flexible sigmoidoscopy** or colonoscopy.** / Every 5 years for a flexible sigmoidoscopy or every 10 years for a colonoscopy beginning at age 53 and continuing until age 73.  Hepatitis C blood  test.** / For all people born from [birth year] through 1965 and any individual with known risks for hepatitis C.  Abdominal aortic aneurysm (AAA) screening.** / A one-time screening for ages 21 to 82 years who are current or former smokers.  Skin self-exam. / Monthly.  Influenza vaccine. / Every year.  Tetanus, diphtheria, and acellular pertussis (Tdap/Td) vaccine.** / 1 dose of Td every 10 years.  Varicella vaccine.** / Consult your health care provider.  Zoster vaccine.** / 1 dose for adults aged 68 years or older.  Pneumococcal 13-valent conjugate (PCV13) vaccine.** / Consult your health care provider.  Pneumococcal polysaccharide (PPSV23) vaccine.** / 1 dose for all adults aged 74 years and older.  Meningococcal vaccine.** / Consult your health care provider.  Hepatitis A vaccine.** / Consult your health care provider.  Hepatitis B vaccine.** / Consult your health care provider.  Haemophilus influenzae type b (Hib) vaccine.** / Consult your health care provider. **Family history and personal history of risk and conditions may change your health care provider's recommendations. Document Released: 03/27/2001 Document Revised: 02/03/2013 Document Reviewed: 06/26/2010 [facility] Patient Information 2015 [place], [place]. This information is not intended to replace advice given to you by your health care provider. Make sure you discuss any questions you have with your health care provider.  Lipid panel fasting today with cbc and cmp. Fluvaccine done today. Order to do  tdap given today/printed. Follow up 3 months or as needed

## 2014-01-11 ENCOUNTER — Other Ambulatory Visit: Payer: Self-pay | Admitting: Internal Medicine

## 2014-01-11 NOTE — Telephone Encounter (Signed)
Med filled.  

## 2014-02-11 ENCOUNTER — Other Ambulatory Visit: Payer: Self-pay | Admitting: Family Medicine

## 2014-02-15 NOTE — Telephone Encounter (Signed)
Med filled.  

## 2014-02-17 ENCOUNTER — Ambulatory Visit (INDEPENDENT_AMBULATORY_CARE_PROVIDER_SITE_OTHER): Payer: Medicare Other | Admitting: Family Medicine

## 2014-02-17 ENCOUNTER — Encounter: Payer: Self-pay | Admitting: Family Medicine

## 2014-02-17 VITALS — BP 124/70 | HR 68 | Temp 98.0°F | Resp 16 | Wt 336.1 lb

## 2014-02-17 DIAGNOSIS — Z23 Encounter for immunization: Secondary | ICD-10-CM

## 2014-02-17 DIAGNOSIS — H9313 Tinnitus, bilateral: Secondary | ICD-10-CM

## 2014-02-17 DIAGNOSIS — N400 Enlarged prostate without lower urinary tract symptoms: Secondary | ICD-10-CM

## 2014-02-17 LAB — PSA: PSA: 0.9 ng/mL (ref 0.10–4.00)

## 2014-02-17 NOTE — Progress Notes (Signed)
Pre visit review using our clinic review tool, if applicable. No additional management support is needed unless otherwise documented below in the visit note. 

## 2014-02-17 NOTE — Progress Notes (Signed)
   Subjective:    Patient ID: Gary Butler, male    DOB: 1945/05/06, 69 y.o.   MRN: [id number]  HPI Tinnitus- worsening recently.  Has caused hearing to deteriorate.  Has audiologist for hearing aides but has not seen ENT recently.  Pt prefers [facility] provider.  Some dizziness.  BPH- pt sees urology ([facility]) who recommended that he have PSA done at PCP office.  Will do today.   Review of Systems For ROS see HPI     Objective:   Physical Exam  Constitutional: He is oriented to person, place, and time. He appears well-developed and well-nourished. No distress.  obese  HENT:  Head: Normocephalic and atraumatic.  Right Ear: External ear normal.  Left Ear: External ear normal.  Eyes: Conjunctivae and EOM are normal. Pupils are equal, round, and reactive to light.  Musculoskeletal: He exhibits no edema.  Neurological: He is alert and oriented to person, place, and time. He has normal reflexes. No cranial nerve deficit. Coordination normal.  Skin: Skin is warm and dry. No erythema.  Psychiatric: He has a normal mood and affect. His behavior is normal. Thought content normal.  Vitals reviewed.         Assessment & Plan:

## 2014-02-17 NOTE — Patient Instructions (Signed)
Schedule your complete physical for April We'll notify you of your lab results and make any changes if needed We'll call you with your ENT appt Call with any questions or concerns Happy New Year!!

## 2014-02-21 NOTE — Assessment & Plan Note (Signed)
Chronic problem.  Following w/ [facility] who recommended pt get PSA done at PCP office.  Labs ordered.

## 2014-02-21 NOTE — Assessment & Plan Note (Signed)
Pt reports recent notable worsening of sxs.  Due to this, will need ENT referral to r/o nerve lesion or other abnormality.  Pt expressed understanding and is in agreement w/ plan.

## 2014-03-18 ENCOUNTER — Other Ambulatory Visit: Payer: Self-pay | Admitting: Family Medicine

## 2014-03-19 NOTE — Telephone Encounter (Signed)
Med filled.  

## 2014-03-23 LAB — HEMOGLOBIN A1C: Hgb A1c MFr Bld: 6 % (ref 4.0–6.0)

## 2014-03-24 ENCOUNTER — Encounter: Payer: Self-pay | Admitting: General Practice

## 2014-04-21 LAB — HM DIABETES EYE EXAM

## 2014-04-29 ENCOUNTER — Encounter: Payer: Self-pay | Admitting: Family Medicine

## 2014-04-29 ENCOUNTER — Ambulatory Visit (INDEPENDENT_AMBULATORY_CARE_PROVIDER_SITE_OTHER): Payer: Medicare Other | Admitting: Family Medicine

## 2014-04-29 VITALS — BP 122/76 | HR 65 | Temp 98.2°F | Resp 16 | Wt 332.4 lb

## 2014-04-29 DIAGNOSIS — R413 Other amnesia: Secondary | ICD-10-CM

## 2014-04-29 LAB — CBC WITH DIFFERENTIAL/PLATELET
Basophils Absolute: 0 10*3/uL (ref 0.0–0.1)
Basophils Relative: 0.5 % (ref 0.0–3.0)
Eosinophils Absolute: 0.2 10*3/uL (ref 0.0–0.7)
Eosinophils Relative: 4.3 % (ref 0.0–5.0)
HCT: 44.4 % (ref 39.0–52.0)
Hemoglobin: 15.1 g/dL (ref 13.0–17.0)
Lymphocytes Relative: 15.6 % (ref 12.0–46.0)
Lymphs Abs: 0.9 10*3/uL (ref 0.7–4.0)
MCHC: 34.1 g/dL (ref 30.0–36.0)
MCV: 91.7 fl (ref 78.0–100.0)
Monocytes Absolute: 0.5 10*3/uL (ref 0.1–1.0)
Monocytes Relative: 8.4 % (ref 3.0–12.0)
Neutro Abs: 3.9 10*3/uL (ref 1.4–7.7)
Neutrophils Relative %: 71.2 % (ref 43.0–77.0)
Platelets: 196 10*3/uL (ref 150.0–400.0)
RBC: 4.84 Mil/uL (ref 4.22–5.81)
RDW: 17.1 % — ABNORMAL HIGH (ref 11.5–15.5)
WBC: 5.5 10*3/uL (ref 4.0–10.5)

## 2014-04-29 LAB — HEPATIC FUNCTION PANEL
ALT: 27 U/L (ref 0–53)
AST: 26 U/L (ref 0–37)
Albumin: 3.7 g/dL (ref 3.5–5.2)
Alkaline Phosphatase: 85 U/L (ref 39–117)
Bilirubin, Direct: 0.3 mg/dL (ref 0.0–0.3)
Total Bilirubin: 1.1 mg/dL (ref 0.2–1.2)
Total Protein: 6 g/dL (ref 6.0–8.3)

## 2014-04-29 LAB — B12 AND FOLATE PANEL
Folate: >23.3 ng/mL (ref >5.9)
Vitamin B-12: 238 pg/mL (ref 211–911)

## 2014-04-29 LAB — BASIC METABOLIC PANEL
BUN: 14 mg/dL (ref 6–23)
CO2: 29 mEq/L (ref 19–32)
Calcium: 9.3 mg/dL (ref 8.4–10.5)
Chloride: 99 mEq/L (ref 96–112)
Creatinine, Ser: 0.97 mg/dL (ref 0.40–1.50)
GFR: 81.46 mL/min (ref >60.00)
Glucose, Bld: 189 mg/dL — ABNORMAL HIGH (ref 70–99)
Potassium: 3.4 mEq/L — ABNORMAL LOW (ref 3.5–5.1)
Sodium: 136 mEq/L (ref 135–145)

## 2014-04-29 LAB — VITAMIN D 25 HYDROXY (VIT D DEFICIENCY, FRACTURES): VITD: 31.79 ng/mL (ref 30.00–100.00)

## 2014-04-29 LAB — RPR

## 2014-04-29 LAB — TSH: TSH: 2 u[IU]/mL (ref 0.35–4.50)

## 2014-04-29 NOTE — Assessment & Plan Note (Signed)
New.  Wife is concerned about this b/c it is impacting their relationship.  Pt seems less concerned about it.  Discussed normal age related memory loss and rigid thinking.  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  Will check labs to r/o underlying metabolic cause.  Discussed neuro referral w/ possible neuropsych testing.  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  Pt and wife want to discuss this and will decide once lab results are available.  Total time spent w/ pt, 30+ minutes, >50% spent counseling.

## 2014-04-29 NOTE — Progress Notes (Signed)
Pre visit review using our clinic review tool, if applicable. No additional management support is needed unless otherwise documented below in the visit note. 

## 2014-04-29 NOTE — Patient Instructions (Signed)
Follow up in 6 weeks to recheck memory We'll notify you of your lab results and make any changes if needed Think about starting Zoloft/Sertraline for underlying depression Call with any questions or concerns Hang in there!  We'll figure this out!

## 2014-04-29 NOTE — Progress Notes (Signed)
   Subjective:    Patient ID: Gary Butler, male    DOB: [birth year], 69 y.o.   MRN: [id number]  HPI Memory loss- wife is here today b/c she is concerned.  Pt reports he has to 'really concentrate' to remember how to get where he's going.  Having difficulty w/ names.  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  Is mis-naming common items.  Wife reports he is missing 2-3 appts/month.  Reports short term memory is most impacted.  sxs started 1-2 yrs ago.  Wife feels sxs are worsening.  Pt is having difficulty w/ multi-step directions/commands.  Able to follow a list w/o difficulty.  Wife is finding it difficult to communicate- which is very upsetting to him.  Wife reports quick to anger.   Review of Systems For ROS see HPI     Objective:   Physical Exam  Constitutional: He is oriented to person, place, and time. He appears well-developed and well-nourished. No distress.  obese  HENT:  Head: Normocephalic and atraumatic.  Neurological: He is alert and oriented to person, place, and time. No cranial nerve deficit. Coordination normal.  Skin: Skin is warm and dry.  Psychiatric: He has a normal mood and affect. His behavior is normal. Thought content normal.  Vitals reviewed.         Assessment & Plan:

## 2014-04-30 ENCOUNTER — Telehealth: Payer: Self-pay | Admitting: Family Medicine

## 2014-04-30 MED ORDER — SERTRALINE HCL 50 MG PO TABS: ORAL_TABLET | ORAL | Status: DC

## 2014-04-30 NOTE — Telephone Encounter (Signed)
Med filled, will notify pt.  

## 2014-04-30 NOTE — Telephone Encounter (Signed)
Ok for Zoloft 50mg - 1/2 tab daily x2 weeks and then increase to 1 tab daily, #30, 3 refills

## 2014-04-30 NOTE — Telephone Encounter (Signed)
Caller name: Ranvir, Renovato Relation to pt: self  Call back number: [phone] Pharmacy: [id number] - [place], [place] [phone] (Phone)         Reason for call:  Pt states MD advised him to call in if he was ready to start the zoloft, pt request RX

## 2014-05-11 ENCOUNTER — Encounter: Payer: Self-pay | Admitting: General Practice

## 2014-05-18 ENCOUNTER — Other Ambulatory Visit: Payer: Self-pay | Admitting: Family Medicine

## 2014-05-18 NOTE — Telephone Encounter (Signed)
Med filled.  

## 2014-05-26 ENCOUNTER — Telehealth: Payer: Self-pay | Admitting: Hematology and Oncology

## 2014-05-26 NOTE — Telephone Encounter (Signed)
Spoke with patient and he will come in and see dr Nicholes Mango

## 2014-06-10 ENCOUNTER — Encounter: Payer: Self-pay | Admitting: Family Medicine

## 2014-06-10 ENCOUNTER — Ambulatory Visit: Payer: Medicare Other | Admitting: Pulmonary Disease

## 2014-06-10 ENCOUNTER — Ambulatory Visit (INDEPENDENT_AMBULATORY_CARE_PROVIDER_SITE_OTHER): Payer: Medicare Other | Admitting: Family Medicine

## 2014-06-10 VITALS — BP 106/62 | HR 69 | Temp 98.0°F | Resp 16 | Wt 333.5 lb

## 2014-06-10 DIAGNOSIS — R413 Other amnesia: Secondary | ICD-10-CM

## 2014-06-10 NOTE — Progress Notes (Signed)
   Subjective:    Patient ID: Gary Butler, male    DOB: [birth year], 69 y.o.   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  Pt reports 'mood has smoothed out'.  Pt reports pt is less irritable and has better communication w/ wife.  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  Pt has no side effects from current medication- Zoloft.  Denies fatigue, insomnia, anorexia.   Review of Systems For ROS see HPI     Objective:   Physical Exam  Constitutional: He is oriented to person, place, and time. He appears well-developed and well-nourished. No distress.  HENT:  Head: Normocephalic and atraumatic.  Neurological: He is alert and oriented to person, place, and time.  Psychiatric: He has a normal mood and affect. His behavior is normal. Thought content normal.  Vitals reviewed.         Assessment & Plan:

## 2014-06-10 NOTE — Progress Notes (Signed)
Pre visit review using our clinic review tool, if applicable. No additional management support is needed unless otherwise documented below in the visit note. 

## 2014-06-10 NOTE — Assessment & Plan Note (Signed)
Improved.  Pt's sxs more consistent w/ pseudo-dementia.  This has improved since starting Zoloft.  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  No med changes at this time.  Will follow.

## 2014-06-10 NOTE — Patient Instructions (Signed)
Schedule your complete physical for after Oct 23 Keep up the good work!!  You're doing great! Call with any questions or concerns Happy Spring!!!

## 2014-06-11 ENCOUNTER — Encounter: Payer: Self-pay | Admitting: Pulmonary Disease

## 2014-06-11 ENCOUNTER — Ambulatory Visit (INDEPENDENT_AMBULATORY_CARE_PROVIDER_SITE_OTHER): Payer: Medicare Other | Admitting: Pulmonary Disease

## 2014-06-11 VITALS — BP 114/62 | HR 65 | Temp 97.6°F | Ht 71.5 in | Wt 333.6 lb

## 2014-06-11 DIAGNOSIS — G4733 Obstructive sleep apnea (adult) (pediatric): Secondary | ICD-10-CM | POA: Diagnosis not present

## 2014-06-11 NOTE — Assessment & Plan Note (Signed)
The patient feels that he is doing very well with his C Pap device, but is concerned about some memory issues during the day. He is wanting to know if this has anything to do with his sleep apnea control. We will get a download off his device to see if his apnea is adequately controlled, and if it is, we will then check overnight oximetry to make sure he is not having significant desaturation. I have encouraged him to work aggressively on weight loss, and to keep up with his mask cushion changes and supplies.

## 2014-06-11 NOTE — Patient Instructions (Signed)
Please check and see if you have a resmed cpap machine.  If so, please bring your SD card by for a 20mos download.  If you have a respironics machine, will need to have you home care company do the 82mos download, and fax to me. Keep working on weight loss, and keep up with mask cushions and supplies. followup with Dr. Halford Chessman in one year.

## 2014-06-11 NOTE — Progress Notes (Signed)
   Subjective:    Patient ID: Gary Butler, male    DOB: [birth year], 69 y.o.   MRN: [id number]  HPI The patient comes in today for follow-up of his obstructive sleep apnea. Wearing C Pap compliantly, and is having no issues with his mask fit or pressure. He feels that he sleeps well with the device, and denies any daytime alertness issues. He is concerned about some memory issues during the day, and wants to make sure it is not related to his sleep apnea.   Review of Systems  Constitutional: Negative for fever and unexpected weight change.  HENT: Negative for congestion, dental problem, ear pain, nosebleeds, postnasal drip, rhinorrhea, sinus pressure, sneezing, sore throat and trouble swallowing.   Eyes: Negative for redness and itching.  Respiratory: Negative for cough, chest tightness, shortness of breath and wheezing.   Cardiovascular: Negative for palpitations and leg swelling.  Gastrointestinal: Negative for nausea and vomiting.  Genitourinary: Negative for dysuria.  Musculoskeletal: Negative for joint swelling.  Skin: Negative for rash.  Neurological: Negative for headaches.  Hematological: Does not bruise/bleed easily.  Psychiatric/Behavioral: Negative for dysphoric mood. The patient is not nervous/anxious.        Objective:   Physical Exam Obese male in no acute distress Nose without purulence or discharge noted No skin breakdown or pressure necrosis from the C Pap mask Neck without lymphadenopathy or thyromegaly Lower extremities with mild edema, no cyanosis Alert and oriented, moves all 4 extremities.       Assessment & Plan:

## 2014-06-23 ENCOUNTER — Ambulatory Visit: Payer: Medicare Other

## 2014-06-23 ENCOUNTER — Other Ambulatory Visit: Payer: Medicare Other

## 2014-06-28 ENCOUNTER — Ambulatory Visit (INDEPENDENT_AMBULATORY_CARE_PROVIDER_SITE_OTHER): Payer: Medicare Other | Admitting: Family

## 2014-06-28 ENCOUNTER — Telehealth: Payer: Self-pay | Admitting: Family

## 2014-06-28 ENCOUNTER — Encounter: Payer: Self-pay | Admitting: Family

## 2014-06-28 VITALS — BP 130/78 | HR 65 | Temp 97.5°F | Resp 16 | Ht 71.0 in | Wt 333.8 lb

## 2014-06-28 DIAGNOSIS — H6590 Unspecified nonsuppurative otitis media, unspecified ear: Secondary | ICD-10-CM | POA: Insufficient documentation

## 2014-06-28 DIAGNOSIS — R011 Cardiac murmur, unspecified: Secondary | ICD-10-CM

## 2014-06-28 DIAGNOSIS — J209 Acute bronchitis, unspecified: Secondary | ICD-10-CM | POA: Diagnosis not present

## 2014-06-28 DIAGNOSIS — H65192 Other acute nonsuppurative otitis media, left ear: Secondary | ICD-10-CM | POA: Diagnosis not present

## 2014-06-28 DIAGNOSIS — I350 Nonrheumatic aortic (valve) stenosis: Secondary | ICD-10-CM | POA: Insufficient documentation

## 2014-06-28 MED ORDER — AZITHROMYCIN 250 MG PO TABS: ORAL_TABLET | ORAL | Status: DC

## 2014-06-28 NOTE — Telephone Encounter (Signed)
Please let pt know that I reviewed his chart as it relates to his murmur and I see that his last echo was in 2006. I would like to repeat this to check his heart valves.  Order pended below.

## 2014-06-28 NOTE — Progress Notes (Addendum)
Subjective:    Patient ID: Gary Butler, male    DOB: 1945/04/16, 69 y.o.   MRN: [id number]  HPI  Mr. Dhanani is a 69 yr old male who presents today with complaint of cough x 2 weeks. Cough is very productive of clear sputum. Cough is worsening. Reports hx of pneumonia in the past.  Reports that he initially thought sxs secondary to allergies, tried zyrtec without improve.  Reports no fever.  Some days has been dragging.  Denies post nasal drip, ear pain or sore throat. Reports some lung discomfort with coughing.     Review of Systems    see HPI  Past Medical History  Diagnosis Date  . Diabetes mellitus   . Hypertension   . OSA (obstructive sleep apnea)   . Arthritis   . Heart murmur   . Sleep apnea   . History of gout   . Psoriatic arthritis   . History of skin cancer   . Constipation   . Tinnitus     History   Social History  . Marital Status: Married    Spouse Name: N/A  . Number of Children: Y  . Years of Education: N/A   Occupational History  . retired      still works part-time for [employer] as a Therapist, occupational History Main Topics  . Smoking status: Never Smoker   . Smokeless tobacco: Not on file  . Alcohol Use: Yes     Comment: rare  . Drug Use: No  . Sexual Activity: No   Other Topics Concern  . Not on file   Social History Narrative   Alcohol- 1 to 2 per month.     Past Surgical History  Procedure Laterality Date  . Addenoids    . Tonsillectomy    . Joint replacement  2012    RT KNEE  . Lung biopsy      20 YRS AGO  . Total knee arthroplasty Bilateral 06/22/2013    Procedure: LEFT TOTAL KNEE ARTHROPLASTY;  MEDIAL SOFT TISSUE EXPLORATION SAPHENOUS NEURECTOMY RIGHT KNEE;  Surgeon: Mauri Pole, MD;  Location: [facility];  Service: Orthopedics;  Laterality: Bilateral;    Family History  Problem Relation Age of Onset  . Emphysema Father   . Clotting disorder Father   . Arthritis Father   . Heart disease Mother   . Arthritis Mother   .  Diabetes Maternal Aunt   . Diabetes Paternal Aunt   . Arthritis Maternal Grandmother   . Hypertension Maternal Grandmother   . Diabetes Maternal Grandmother   . Arthritis Paternal Grandmother     Allergies  Allergen Reactions  . Lyrica [Pregabalin] Swelling  . Penicillins Swelling  . Levofloxacin Rash    Current Outpatient Prescriptions on File Prior to Visit  Medication Sig Dispense Refill  . allopurinol (ZYLOPRIM) 300 MG tablet TAKE 1 TABLET BY MOUTH DAILY 30 tablet 3  . atenolol (TENORMIN) 50 MG tablet Take 50 mg by mouth every morning.     Marland Kitchen atorvastatin (LIPITOR) 40 MG tablet Take 40 mg by mouth every evening.     . Cholecalciferol (VITAMIN D) 2000 UNITS tablet Take 2,000 Units by mouth daily.    . Cyanocobalamin (B-12) 2000 MCG TABS Take by mouth.    . folic acid (FOLVITE) 1 MG tablet Take 1 mg by mouth daily.     Marland Kitchen HYDROcodone-acetaminophen (NORCO) 7.5-325 MG per tablet Take 1-2 tablets by mouth every 4 (four) hours while awake. [place]  tablet 0  . methotrexate (RHEUMATREX) 2.5 MG tablet     . pioglitazone (ACTOS) 30 MG tablet Take 30 mg by mouth daily.      . potassium chloride (MICRO-K) 10 MEQ CR capsule Take 1 capsule by mouth daily.    . ramipril (ALTACE) 5 MG capsule TAKE ONE CAPSULE BY MOUTH EVERY DAY 30 capsule 3  . sertraline (ZOLOFT) 50 MG tablet 1/2 tab daily x2 weeks and then increase to 1 tab daily. 30 tablet 3  . triamterene-hydrochlorothiazide (MAXZIDE) 75-50 MG per tablet Take 0.5 tablets by mouth every morning. 30 tablet 6   No current facility-administered medications on file prior to visit.    BP 130/78 mmHg  Pulse 65  Temp(Src) 97.5 F (36.4 C) (Oral)  Resp 16  Ht 5\' 11"  (1.803 m)  Wt 333 lb 12.8 oz (151.411 kg)  BMI 46.58 kg/m2  SpO2 95%    Objective:   Physical Exam  Constitutional: He is oriented to person, place, and time. He appears well-developed and well-nourished. No distress.  HENT:  Head: Normocephalic and atraumatic.  Right Ear:  Tympanic membrane and ear canal normal.  Left Ear: Ear canal normal. Tympanic membrane is erythematous.  Cardiovascular: Normal rate and regular rhythm.   Murmur heard.  Systolic murmur is present with a grade of 1/6  Pulmonary/Chest: Effort normal and breath sounds normal. No respiratory distress. He has no wheezes. He has no rales.  Musculoskeletal: He exhibits no edema.  Lymphadenopathy:    He has no cervical adenopathy.  Neurological: He is alert and oriented to person, place, and time.  Skin: Skin is warm and dry.  Psychiatric: He has a normal mood and affect. His behavior is normal. Thought content normal.          Assessment & Plan:

## 2014-06-28 NOTE — Assessment & Plan Note (Signed)
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  Will obtain follow up 2D echo.  See phone note.

## 2014-06-28 NOTE — Assessment & Plan Note (Signed)
Pen allergic, rx with zpak.

## 2014-06-28 NOTE — Assessment & Plan Note (Signed)
Will rx with zpak. Pt to follow up if symptoms worsen or do not improve.

## 2014-06-28 NOTE — Patient Instructions (Signed)
Start zpak for bronchitis and ear infection. Call if symptoms worsen, if you develop fever or if not improved in 3 days.

## 2014-06-28 NOTE — Progress Notes (Signed)
Pre visit review using our clinic review tool, if applicable. No additional management support is needed unless otherwise documented below in the visit note. 

## 2014-06-29 NOTE — Telephone Encounter (Signed)
Notified pt and he is agreeable to proceed with echo. Order signed.

## 2014-07-07 ENCOUNTER — Ambulatory Visit (HOSPITAL_BASED_OUTPATIENT_CLINIC_OR_DEPARTMENT_OTHER)
Admission: RE | Admit: 2014-07-07 | Discharge: 2014-07-07 | Disposition: A | Discharge status: Home / Self Care | Payer: Medicare Other | Source: Ambulatory Visit | Attending: Family | Admitting: Family

## 2014-07-07 DIAGNOSIS — R011 Cardiac murmur, unspecified: Secondary | ICD-10-CM

## 2014-07-07 DIAGNOSIS — R010 Benign and innocent cardiac murmurs: Secondary | ICD-10-CM | POA: Diagnosis not present

## 2014-07-07 DIAGNOSIS — I350 Nonrheumatic aortic (valve) stenosis: Secondary | ICD-10-CM | POA: Insufficient documentation

## 2014-07-07 NOTE — Progress Notes (Signed)
*  PRELIMINARY RESULTS* Echocardiogram 2D Echocardiogram has been performed.  Gary Butler 07/07/2014, 2:55 PM

## 2014-07-09 ENCOUNTER — Encounter: Payer: Self-pay | Admitting: Family

## 2014-07-15 ENCOUNTER — Other Ambulatory Visit: Payer: Self-pay | Admitting: Family Medicine

## 2014-07-15 NOTE — Telephone Encounter (Signed)
Med filled.  

## 2014-07-16 ENCOUNTER — Other Ambulatory Visit: Payer: Self-pay | Admitting: Hematology and Oncology

## 2014-07-16 DIAGNOSIS — D751 Secondary polycythemia: Secondary | ICD-10-CM

## 2014-07-19 ENCOUNTER — Encounter: Payer: Self-pay | Admitting: Hematology and Oncology

## 2014-07-19 ENCOUNTER — Ambulatory Visit (HOSPITAL_BASED_OUTPATIENT_CLINIC_OR_DEPARTMENT_OTHER): Payer: Medicare Other | Admitting: Hematology and Oncology

## 2014-07-19 ENCOUNTER — Other Ambulatory Visit (HOSPITAL_BASED_OUTPATIENT_CLINIC_OR_DEPARTMENT_OTHER): Payer: Medicare Other

## 2014-07-19 VITALS — BP 103/54 | HR 59 | Temp 97.5°F | Resp 18 | Ht 71.0 in | Wt 334.7 lb

## 2014-07-19 DIAGNOSIS — D751 Secondary polycythemia: Secondary | ICD-10-CM

## 2014-07-19 DIAGNOSIS — G4730 Sleep apnea, unspecified: Secondary | ICD-10-CM | POA: Diagnosis not present

## 2014-07-19 DIAGNOSIS — Z7982 Long term (current) use of aspirin: Secondary | ICD-10-CM | POA: Diagnosis not present

## 2014-07-19 LAB — CBC WITH DIFFERENTIAL/PLATELET
BASO%: 0.4 % (ref 0.0–2.0)
Basophils Absolute: 0 10*3/uL (ref 0.0–0.1)
EOS%: 3.7 % (ref 0.0–7.0)
Eosinophils Absolute: 0.3 10*3/uL (ref 0.0–0.5)
HCT: 46.4 % (ref 38.4–49.9)
HGB: 15.9 g/dL (ref 13.0–17.1)
LYMPH%: 13.4 % — ABNORMAL LOW (ref 14.0–49.0)
MCH: 31.7 pg (ref 27.2–33.4)
MCHC: 34.3 g/dL (ref 32.0–36.0)
MCV: 92.6 fL (ref 79.3–98.0)
MONO#: 0.6 10*3/uL (ref 0.1–0.9)
MONO%: 8.3 % (ref 0.0–14.0)
NEUT#: 5.6 10*3/uL (ref 1.5–6.5)
NEUT%: 74.2 % (ref 39.0–75.0)
Platelets: 165 10*3/uL (ref 140–400)
RBC: 5.01 10*6/uL (ref 4.20–5.82)
RDW: 15.8 % — ABNORMAL HIGH (ref 11.0–14.6)
WBC: 7.6 10*3/uL (ref 4.0–10.3)
lymph#: 1 10*3/uL (ref 0.9–3.3)

## 2014-07-19 NOTE — Assessment & Plan Note (Signed)
He had close follow-up for the last 4 years with stable hemoglobin. The last time he had high hemoglobin detected was in December 2013, up of 17 but since then has been stable. He is taking aspirin therapy and is compliant. He is also taking methotrexate to control gout and I believe the methotrexate in the long run will also help control his hemoglobin. At present time, there is no indication for him to return. The cause of the erythrocytosis is secondary to obstructive sleep apnea and he would not need any further workup or treatment unless his hemoglobin is consistently above 17 g. He is comfortable with follow-up with primary care provider only from now on and I recommend only annual CBC to track his hemoglobin in the future.

## 2014-07-19 NOTE — Progress Notes (Signed)
[facility] progress notes  Patient Care Team: [name], MD as PCP - General (Family Medicine) [name], MD as Attending Physician (Family Medicine) Delrae Rend, MD as Consulting Physician (Endocrinology) Devra Dopp, MD as Referring Physician (Dermatology) Sharyne Peach, MD as Consulting Physician (Ophthalmology)  CHIEF COMPLAINTS/PURPOSE OF VISIT:  Chronic erythrocytosis, secondary to obstructive sleep apnea  HISTORY OF PRESENTING ILLNESS:  Gary Butler 69 y.o. male was transferred to my care after his prior physician has left.  I reviewed the patient's records extensive and collaborated the history with the patient. Summary of his history is as follows:  This patient was followed closely by another hematologist in the past with extensive workup for erythrocytosis. Peripheral blood for JAK2 mutation was negative. The patient was observed. From 2012 to present, he has a few occasion when his hemoglobin is greater than 16 g. He denies history of blood clot. He is currently taking methotrexate long-term to control gout.  He is compliant taking aspirin He denies recent chest pain or shortness of breath. No new diagnosis of blood clots.  MEDICAL HISTORY:  Past Medical History  Diagnosis Date  . Diabetes mellitus   . Hypertension   . OSA (obstructive sleep apnea)   . Arthritis   . Heart murmur   . Sleep apnea   . History of gout   . Psoriatic arthritis   . History of skin cancer   . Constipation   . Tinnitus     SURGICAL HISTORY: Past Surgical History  Procedure Laterality Date  . Addenoids    . Tonsillectomy    . Joint replacement  2012    RT KNEE  . Lung biopsy      20 YRS AGO  . Total knee arthroplasty Bilateral 06/22/2013    Procedure: LEFT TOTAL KNEE ARTHROPLASTY;  MEDIAL SOFT TISSUE EXPLORATION SAPHENOUS NEURECTOMY RIGHT KNEE;  Surgeon: Mauri Pole, MD;  Location: [facility];  Service: Orthopedics;  Laterality:  Bilateral;    SOCIAL HISTORY: History   Social History  . Marital Status: Married    Spouse Name: N/A  . Number of Children: Y  . Years of Education: N/A   Occupational History  . retired      still works part-time for [employer] as a Therapist, occupational History Main Topics  . Smoking status: Never Smoker   . Smokeless tobacco: Never Used  . Alcohol Use: Yes     Comment: rare  . Drug Use: No  . Sexual Activity: No   Other Topics Concern  . Not on file   Social History Narrative   Alcohol- 1 to 2 per month.     FAMILY HISTORY: Family History  Problem Relation Age of Onset  . Emphysema Father   . Clotting disorder Father   . Arthritis Father   . Heart disease Mother   . Arthritis Mother   . Diabetes Maternal Aunt   . Diabetes Paternal Aunt   . Arthritis Maternal Grandmother   . Hypertension Maternal Grandmother   . Diabetes Maternal Grandmother   . Arthritis Paternal Grandmother     ALLERGIES:  is allergic to lyrica; penicillins; and levofloxacin.  MEDICATIONS:  Current Outpatient Prescriptions  Medication Sig Dispense Refill  . allopurinol (ZYLOPRIM) 300 MG tablet TAKE 1 TABLET BY MOUTH DAILY 30 tablet 3  . aspirin 81 MG tablet Take 81 mg by mouth daily.    Marland Kitchen atenolol (TENORMIN) 50 MG tablet Take 50 mg by mouth  every morning.     Marland Kitchen atorvastatin (LIPITOR) 40 MG tablet Take 40 mg by mouth every evening.     Marland Kitchen azithromycin (ZITHROMAX) 250 MG tablet 2 tabs by mouth today, then one tab by mouth daily for 4 more days 6 tablet 0  . Cholecalciferol (VITAMIN D) 2000 UNITS tablet Take 2,000 Units by mouth daily.    . Cyanocobalamin (B-12) 2000 MCG TABS Take by mouth.    . folic acid (FOLVITE) 1 MG tablet Take 1 mg by mouth daily.     . methotrexate (RHEUMATREX) 2.5 MG tablet     . pioglitazone (ACTOS) 30 MG tablet Take 30 mg by mouth daily.      . potassium chloride (MICRO-K) 10 MEQ CR capsule Take 1 capsule by mouth daily.    . ramipril (ALTACE) 5 MG capsule TAKE  ONE CAPSULE BY MOUTH DAILY 30 capsule 6  . sertraline (ZOLOFT) 50 MG tablet 1/2 tab daily x2 weeks and then increase to 1 tab daily. 30 tablet 3  . triamterene-hydrochlorothiazide (MAXZIDE) 75-50 MG per tablet Take 0.5 tablets by mouth every morning. 30 tablet 6   No current facility-administered medications for this visit.    REVIEW OF SYSTEMS:   Constitutional: Denies fevers, chills or abnormal night sweats Eyes: Denies blurriness of vision, double vision or watery eyes Ears, nose, mouth, throat, and face: Denies mucositis or sore throat Respiratory: Denies cough, dyspnea or wheezes Cardiovascular: Denies palpitation, chest discomfort or lower extremity swelling Gastrointestinal:  Denies nausea, heartburn or change in bowel habits Skin: Denies abnormal skin rashes Lymphatics: Denies new lymphadenopathy or easy bruising Neurological:Denies numbness, tingling or new weaknesses Behavioral/Psych: Mood is stable, no new changes  All other systems were reviewed with the patient and are negative.  PHYSICAL EXAMINATION: ECOG PERFORMANCE STATUS: 0 - Asymptomatic  Filed Vitals:   07/19/14 1147  BP: 103/54  Pulse: 59  Temp: 97.5 F (36.4 C)  Resp: 18   Filed Weights   07/19/14 1147  Weight: 334 lb 11.2 oz (151.819 kg)    GENERAL:alert, no distress and comfortable. He is morbidly obese SKIN: skin color, texture, turgor are normal, no rashes or significant lesions EYES: normal, conjunctiva are pink and non-injected, sclera clear  PSYCH: alert & oriented x 3 with fluent speech NEURO: no focal motor/sensory deficits  LABORATORY DATA:  I have reviewed the data as listed Lab Results  Component Value Date   WBC 7.6 07/19/2014   HGB 15.9 07/19/2014   HCT 46.4 07/19/2014   MCV 92.6 07/19/2014   PLT 165 07/19/2014    Recent Labs  12/04/13 1031 04/29/14 0919  NA 136 136  K 3.5 3.4*  CL 101 99  CO2 25 29  GLUCOSE 129* 189*  BUN 15 14  CREATININE 0.9 0.97  CALCIUM 8.9 9.3   PROT 6.5 6.0  ALBUMIN 3.1* 3.7  AST 23 26  ALT 20 27  ALKPHOS 91 85  BILITOT 1.5* 1.1  BILIDIR  --  0.3   ASSESSMENT & PLAN:  Erythrocytosis He had close follow-up for the last 4 years with stable hemoglobin. The last time he had high hemoglobin detected was in December 2013, up of 17 but since then has been stable. He is taking aspirin therapy and is compliant. He is also taking methotrexate to control gout and I believe the methotrexate in the long run will also help control his hemoglobin. At present time, there is no indication for him to return. The cause of the erythrocytosis is secondary  to obstructive sleep apnea and he would not need any further workup or treatment unless his hemoglobin is consistently above 17 g. He is comfortable with follow-up with primary care provider only from now on and I recommend only annual CBC to track his hemoglobin in the future.    No orders of the defined types were placed in this encounter.    All questions were answered. The patient knows to call the clinic with any problems, questions or concerns. I spent 15 minutes counseling the patient face to face. The total time spent in the appointment was 20 minutes and more than 50% was on counseling.     [facility] [place], [place], MD 07/19/2014 12:16 PM

## 2014-09-04 ENCOUNTER — Other Ambulatory Visit: Payer: Self-pay | Admitting: Family Medicine

## 2014-09-06 NOTE — Telephone Encounter (Signed)
Sertraline refilled per protocol. JG//CMA 

## 2014-09-18 ENCOUNTER — Other Ambulatory Visit: Payer: Self-pay | Admitting: Family Medicine

## 2014-09-20 NOTE — Telephone Encounter (Signed)
Medication filled to pharmacy as requested.   

## 2014-11-20 ENCOUNTER — Other Ambulatory Visit: Payer: Self-pay | Admitting: Family Medicine

## 2014-11-22 ENCOUNTER — Ambulatory Visit (INDEPENDENT_AMBULATORY_CARE_PROVIDER_SITE_OTHER): Payer: Medicare Other | Admitting: Family Medicine

## 2014-11-22 ENCOUNTER — Encounter: Payer: Self-pay | Admitting: Family Medicine

## 2014-11-22 VITALS — BP 123/65 | HR 58 | Temp 97.7°F | Resp 18 | Ht 71.0 in | Wt 346.1 lb

## 2014-11-22 DIAGNOSIS — J0100 Acute maxillary sinusitis, unspecified: Secondary | ICD-10-CM | POA: Diagnosis not present

## 2014-11-22 DIAGNOSIS — R413 Other amnesia: Secondary | ICD-10-CM

## 2014-11-22 MED ORDER — PROMETHAZINE-DM 6.25-15 MG/5ML PO SYRP: 5.0000 mL | ORAL_SOLUTION | Freq: Four times a day (QID) | ORAL | Status: DC | PRN

## 2014-11-22 MED ORDER — AZITHROMYCIN 250 MG PO TABS: ORAL_TABLET | ORAL | Status: DC

## 2014-11-22 NOTE — Progress Notes (Signed)
   Subjective:    Patient ID: Gary Butler, male    DOB: [birth year], 69 y.o.   MRN: [id number]  HPI URI- sxs started ~1 week ago but acutely worsened in the last 2 days.  + chest congestion, nasal congestion, maxillary sinus pressure.  No fevers.  No known sick contacts.  No ear pain.  No N/V.  Cough- productive of thick white sputum.  Memory loss- 'how do i get tested for dementia?'.  Reports Zoloft has improved mood but he is having difficulty w/ short term memory.  Review of Systems For ROS see HPI     Objective:   Physical Exam  Constitutional: He appears well-developed and well-nourished. No distress.  HENT:  Head: Normocephalic and atraumatic.  Right Ear: Tympanic membrane normal.  Left Ear: Tympanic membrane normal.  Nose: Mucosal edema and rhinorrhea present. Right sinus exhibits maxillary sinus tenderness and frontal sinus tenderness. Left sinus exhibits maxillary sinus tenderness and frontal sinus tenderness.  Mouth/Throat: Mucous membranes are normal. Oropharyngeal exudate and posterior oropharyngeal erythema present. No posterior oropharyngeal edema.  + PND  Eyes: Conjunctivae and EOM are normal. Pupils are equal, round, and reactive to light.  Neck: Normal range of motion. Neck supple.  Cardiovascular: Normal rate, regular rhythm and normal heart sounds.   Pulmonary/Chest: Effort normal and breath sounds normal. No respiratory distress. He has no wheezes.  + hacking cough  Lymphadenopathy:    He has no cervical adenopathy.  Skin: Skin is warm and dry.  Vitals reviewed.         Assessment & Plan:

## 2014-11-22 NOTE — Patient Instructions (Signed)
Follow up as needed Start the Zpack as directed Use the cough syrup as needed- will cause drowsiness Mucinex DM for daytime cough Drink plenty of fluids We'll call you with your neuro referral Call with any questions or concerns Hang in there!! Happy [birth year][employer]day!!!

## 2014-11-22 NOTE — Progress Notes (Signed)
Pre visit review using our clinic review tool, if applicable. No additional management support is needed unless otherwise documented below in the visit note/SLS  

## 2014-11-22 NOTE — Telephone Encounter (Signed)
Refill sent per [facility] refill protocol/SLS  

## 2014-11-26 ENCOUNTER — Telehealth: Payer: Self-pay | Admitting: Family Medicine

## 2014-11-26 MED ORDER — AZITHROMYCIN 250 MG PO TABS: ORAL_TABLET | ORAL | Status: DC

## 2014-11-26 NOTE — Telephone Encounter (Signed)
Relation to KK:DPTE Call back number:[phone] Pharmacy: [id number] - [place], [place] [phone] (Phone) [phone] (Fax)         Reason for call:  Patient requesting a refill azithromycin (ZITHROMAX) 250 MG tablet

## 2014-11-26 NOTE — Telephone Encounter (Signed)
Med filled per provider, ir no better pt needs a follow up appt.

## 2014-11-28 ENCOUNTER — Other Ambulatory Visit: Payer: Self-pay | Admitting: Family Medicine

## 2014-11-28 NOTE — Assessment & Plan Note (Signed)
Pt feels zoloft has improved mood but he is still struggling w/ memory loss.  Would like evaluation for dementia.  Referral to neurology placed.

## 2014-11-28 NOTE — Assessment & Plan Note (Signed)
Recurrent issue for pt.  sxs and PE consistent w/ infxn.  Start abx.  Reviewed supportive care and red flags that should prompt return.  Pt expressed understanding and is in agreement w/ plan.  

## 2014-11-29 NOTE — Telephone Encounter (Signed)
Medication filled to pharmacy as requested.   

## 2014-12-02 ENCOUNTER — Telehealth: Payer: Self-pay | Admitting: Family Medicine

## 2014-12-02 NOTE — Telephone Encounter (Signed)
Pt scheduled for tomorrow 10/21 at 11:00am

## 2014-12-02 NOTE — Telephone Encounter (Signed)
Caller name: Treyton Slimp  Relationship to patient: Self   Can be reached: [phone]  Reason for call: pt says that he is still not feeling better from his last visit. He says that he has finished the medication prescribed. He want to know if he should make an appointment or could Dr. Darene Lamer prescribe more of the medication.

## 2014-12-02 NOTE — Telephone Encounter (Signed)
Please schedule pt for an appt. Ok to use a SDA.

## 2014-12-03 ENCOUNTER — Telehealth: Payer: Self-pay | Admitting: Family Medicine

## 2014-12-03 ENCOUNTER — Ambulatory Visit: Payer: Medicare Other | Admitting: Family Medicine

## 2014-12-03 NOTE — Telephone Encounter (Signed)
Caller name: Keisean Skowron  Relationship to patient: Self  Can be reached: [phone]  Reason for call: Pt has an 11:30 appt today, he called in to let Dr. Birdie Riddle know that he will not be in to his appt today because he is in the hospital for his back pain.

## 2014-12-15 NOTE — Telephone Encounter (Signed)
No charge as pt was in the hospital

## 2014-12-15 NOTE — Telephone Encounter (Signed)
Charge or no charge for 12/03/14? Per below pt was in the hospital.

## 2014-12-18 ENCOUNTER — Other Ambulatory Visit: Payer: Self-pay | Admitting: Family Medicine

## 2014-12-20 NOTE — Telephone Encounter (Signed)
Medication filled to pharmacy as requested.   

## 2014-12-27 ENCOUNTER — Encounter: Payer: Self-pay | Admitting: Neurology

## 2014-12-27 ENCOUNTER — Ambulatory Visit (INDEPENDENT_AMBULATORY_CARE_PROVIDER_SITE_OTHER): Payer: Medicare Other | Admitting: Neurology

## 2014-12-27 VITALS — BP 114/70 | HR 81 | Wt 342.0 lb

## 2014-12-27 DIAGNOSIS — I10 Essential (primary) hypertension: Secondary | ICD-10-CM

## 2014-12-27 DIAGNOSIS — E1165 Type 2 diabetes mellitus with hyperglycemia: Secondary | ICD-10-CM | POA: Insufficient documentation

## 2014-12-27 DIAGNOSIS — E785 Hyperlipidemia, unspecified: Secondary | ICD-10-CM

## 2014-12-27 DIAGNOSIS — R413 Other amnesia: Secondary | ICD-10-CM

## 2014-12-27 DIAGNOSIS — E119 Type 2 diabetes mellitus without complications: Secondary | ICD-10-CM | POA: Diagnosis not present

## 2014-12-27 NOTE — Patient Instructions (Addendum)
1. Schedule MRI brain without contrast 2. Control of BP, cholesterol, diabetes, as well as physical exercise and brain stimulation exercises are important for brain health 3. Follow-up in 6 months  4. You have been scheduled at [facility] for MRI on 12/31/14. Please arrive @ 8:15 am.   Location: [place], [place] [address]  Phone: [phone]

## 2014-12-27 NOTE — Progress Notes (Signed)
NEUROLOGY CONSULTATION NOTE  Gary Butler MRN: [id number] DOB: 04/03/1945  Referring provider: Dr. Annye Asa Primary care provider: Dr. Annye Asa  Reason for consult:  Memory loss  Dear Dr Birdie Riddle:  Thank you for your kind referral of Gary Butler for consultation of the above symptoms. Although his history is well known to you, please allow me to reiterate it for the purpose of our medical record. The patient was accompanied to the clinic by his wife who also provides collateral information. Records and images were personally reviewed where available.  HISTORY OF PRESENT ILLNESS: This is a very pleasant 69 year old right-handed man with vascular risk factors including hypertension, hyperlipidemia, diabetes, presenting for evaluation of worsening short-term memory. He and his wife started noticing memory changes around 2 years ago, he forgets names of people he has known for 40 years, he needs to keep a notepad in his car for reminders, he has word-finding difficulties and says the wrong word sometimes. He has more difficulties multi-tasking. His wife expressed frustration that she has to repeat everything she has told him, he would ask questions about something she had previously explained thoroughly to him. She reports "little details go through his head." Around 3 months ago, he was getting easily angry all the time, even when getting dressed. He was started on Sertraline, and his wife does not notice much of the aggravation he used to have. There is no family history of dementia. He denies any history of significant head injuries, he rarely drinks alcohol (once a month).  He denies any headaches, dizziness, diplopia, dysarthria, dysphagia, neck pain, bowel/bladder dysfunction. No anosmia, tremors. He has bilateral knee pain and recently had back pain from right-sided sciatica. He has occasional numbness in both feet. He fell at the end of September when the stool went out from under  him, no injuries.   Laboratory Data: Lab Results  Component Value Date   WBC 7.6 07/19/2014   HGB 15.9 07/19/2014   HCT 46.4 07/19/2014   MCV 92.6 07/19/2014   PLT 165 07/19/2014     Chemistry      Component Value Date/Time   NA 136 04/29/2014 0919   NA 139 06/17/2013 0946   NA 136* 03/25/2013   K 3.4* 04/29/2014 0919   K 3.5 06/17/2013 0946   CL 99 04/29/2014 0919   CL 102 05/14/2012 0856   CO2 29 04/29/2014 0919   CO2 27 06/17/2013 0946   BUN 14 04/29/2014 0919   BUN 13.8 06/17/2013 0946   BUN 17 03/25/2013   CREATININE 0.97 04/29/2014 0919   CREATININE 1.1 06/17/2013 0946   CREATININE 1.0 03/25/2013   GLU 128 03/25/2013      Component Value Date/Time   CALCIUM 9.3 04/29/2014 0919   CALCIUM 9.3 06/17/2013 0946   ALKPHOS 85 04/29/2014 0919   ALKPHOS 101 06/17/2013 0946   AST 26 04/29/2014 0919   AST 19 06/17/2013 0946   ALT 27 04/29/2014 0919   ALT 18 06/17/2013 0946   BILITOT 1.1 04/29/2014 0919   BILITOT 1.54* 06/17/2013 0946     Lab Results  Component Value Date   TSH 2.00 04/29/2014   Lab Results  Component Value Date   VITAMINB12 238 04/29/2014   Lab Results  Component Value Date   HGBA1C 6.0 03/23/2014     PAST MEDICAL HISTORY: Past Medical History  Diagnosis Date  . Diabetes mellitus   . Hypertension   . OSA (obstructive sleep apnea)   . Arthritis   .  Heart murmur   . Sleep apnea   . History of gout   . Psoriatic arthritis ([place])   . History of skin cancer   . Constipation   . Tinnitus     PAST SURGICAL HISTORY: Past Surgical History  Procedure Laterality Date  . Addenoids    . Tonsillectomy    . Joint replacement  2012    RT KNEE  . Lung biopsy      20 YRS AGO  . Total knee arthroplasty Bilateral 06/22/2013    Procedure: LEFT TOTAL KNEE ARTHROPLASTY;  MEDIAL SOFT TISSUE EXPLORATION SAPHENOUS NEURECTOMY RIGHT KNEE;  Surgeon: Mauri Pole, MD;  Location: [facility];  Service: Orthopedics;  Laterality: Bilateral;     MEDICATIONS: Current Outpatient Prescriptions on File Prior to Visit  Medication Sig Dispense Refill  . allopurinol (ZYLOPRIM) 300 MG tablet TAKE 1 TABLET BY MOUTH DAILY 30 tablet 2  . aspirin 81 MG tablet Take 81 mg by mouth daily.    Marland Kitchen atenolol (TENORMIN) 50 MG tablet Take 50 mg by mouth every morning.     Marland Kitchen atorvastatin (LIPITOR) 40 MG tablet Take 40 mg by mouth every evening.     . Cholecalciferol (VITAMIN D) 2000 UNITS tablet Take 2,000 Units by mouth daily.    . Cyanocobalamin (B-12) 2000 MCG TABS Take by mouth.    . folic acid (FOLVITE) 1 MG tablet Take 1 mg by mouth daily.     . methotrexate (RHEUMATREX) 2.5 MG tablet Take 15 mg by mouth once a week.     . pioglitazone (ACTOS) 30 MG tablet Take 30 mg by mouth daily.      . potassium chloride (MICRO-K) 10 MEQ CR capsule Take 1 capsule by mouth daily.    . sertraline (ZOLOFT) 50 MG tablet Take 1 tablet (50 mg total) by mouth daily. 30 tablet 3  . triamterene-hydrochlorothiazide (MAXZIDE) 75-50 MG tablet TAKE 1/2 TABLET BY MOUTH EVERY MORNING 30 tablet 6   No current facility-administered medications on file prior to visit.    ALLERGIES: Allergies  Allergen Reactions  . Lyrica [Pregabalin] Swelling  . Penicillins Swelling  . Levofloxacin Rash    FAMILY HISTORY: Family History  Problem Relation Age of Onset  . Emphysema Father   . Clotting disorder Father   . Arthritis Father   . Heart disease Mother   . Arthritis Mother   . Diabetes Maternal Aunt   . Diabetes Paternal Aunt   . Arthritis Maternal Grandmother   . Hypertension Maternal Grandmother   . Diabetes Maternal Grandmother   . Arthritis Paternal Grandmother     SOCIAL HISTORY: Social History   Social History  . Marital Status: Married    Spouse Name: N/A  . Number of Children: 2  . Years of Education: N/A   Occupational History  . retired      still works part-time for [employer] as a Therapist, occupational History Main Topics  . Smoking status:  Never Smoker   . Smokeless tobacco: Never Used  . Alcohol Use: 0.0 oz/week    0 Standard drinks or equivalent per week     Comment: Once a month  . Drug Use: No  . Sexual Activity: No   Other Topics Concern  . Not on file   Social History Narrative   Alcohol- 1 to 2 per month.     REVIEW OF SYSTEMS: Constitutional: No fevers, chills, or sweats, no generalized fatigue, change in appetite Eyes: No visual changes, double  vision, eye pain Ear, nose and throat: No hearing loss, ear pain, nasal congestion, sore throat Cardiovascular: No chest pain, palpitations Respiratory:  No shortness of breath at rest or with exertion, wheezes GastrointestinaI: No nausea, vomiting, diarrhea, abdominal pain, fecal incontinence Genitourinary:  No dysuria, urinary retention or frequency Musculoskeletal:  No neck pain,+ back pain Integumentary: No rash, pruritus,+skin lesions (bilateral shin discoloration) Neurological: as above Psychiatric: No depression, insomnia, anxiety Endocrine: No palpitations, fatigue, diaphoresis, mood swings, change in appetite, change in weight, increased thirst Hematologic/Lymphatic:  No anemia, purpura, petechiae. Allergic/Immunologic: no itchy/runny eyes, nasal congestion, recent allergic reactions, rashes  PHYSICAL EXAM: Filed Vitals:   12/27/14 0844  BP: 114/70  Pulse: 81   General: No acute distress Head:  Normocephalic/atraumatic Eyes: Fundoscopic exam shows bilateral sharp discs, no vessel changes, exudates, or hemorrhages Neck: supple, no paraspinal tenderness, full range of motion Back: No paraspinal tenderness Heart: regular rate and rhythm Lungs: Clear to auscultation bilaterally. Vascular: No carotid bruits. Skin/Extremities: No rash, no edema. +discoloration over both shins Neurological Exam: Mental status: alert and oriented to person, place, and time, no dysarthria or aphasia, Fund of knowledge is appropriate.  Recent and remote memory are intact.   Attention and concentration are normal.    Able to name objects and repeat phrases. Named 8 words that begin with F within 1 minute (N > 11). Montreal Cognitive Assessment  12/27/2014  Visuospatial/ Executive (0/5) 5  Naming (0/3) 3  Attention: Read list of digits (0/2) 2  Attention: Read list of letters (0/1) 1  Attention: Serial 7 subtraction starting at 100 (0/3) 3  Language: Repeat phrase (0/2) 2  Language : Fluency (0/1) 0  Abstraction (0/2) 2  Delayed Recall (0/5) 4  Orientation (0/6) 6  Total 28  Adjusted Score (based on education) 28   Cranial nerves: CN I: not tested CN II: pupils equal, round and reactive to light, visual fields intact, fundi unremarkable. CN III, IV, VI:  full range of motion, no nystagmus, no ptosis CN V: facial sensation intact CN VII: upper and lower face symmetric CN VIII: hearing intact to finger rub CN IX, X: gag intact, uvula midline CN XI: sternocleidomastoid and trapezius muscles intact CN XII: tongue midline Bulk & Tone: normal, no fasciculations. Motor: 5/5 throughout with no pronator drift. Sensation: decreased cold on right shin, decreased pin on right foot, otherwise intact to all modalities on both UE, intact vibration and joint position sense.  No extinction to double simultaneous stimulation.  Romberg test negative Deep Tendon Reflexes: +1 throughout except for absent ankle jerks bilaterally, no ankle clonus Plantar responses: downgoing bilaterally Cerebellar: no incoordination on finger to nose testing. No dysdiadochokinesia Gait: narrow-based and steady, mild difficulty with tandem walk but able Tremor: none  IMPRESSION: This is a pleasant 69 year old right-handed man with vascular risk factors including hypertension, hyperlipidemia, diabetes, presenting for evaluation of worsening short-term memory. Neurological exam shows decreased sensation over right LE, otherwise no significant focal abnormalities. MOCA score normal 28/30. We  discussed different causes of memory loss. TSH and B12 normal. MRI brain without contrast will be ordered to assess for underlying structural abnormality and assess vascular load. There is no clear indication to start cholinesterase inhibitors at this time. We discussed the importance of control of vascular risk factors, physical exercise, and brain stimulation exercises for brain health. His wife reported increased irritability that has improved with starting an SSRI. Continue to monitor mood, we also discussed effects of mood on memory. He  will follow-up in 6 months.   Thank you for allowing me to participate in the care of this patient. Please do not hesitate to call for any questions or concerns.   Ellouise Newer, M.D.  CC: Dr. Birdie Riddle

## 2015-01-05 ENCOUNTER — Telehealth: Payer: Self-pay | Admitting: Family Medicine

## 2015-01-05 NOTE — Telephone Encounter (Signed)
Patient was notified of result. 

## 2015-01-05 NOTE — Telephone Encounter (Signed)
-----   Message from Cameron Sprang, MD sent at 01/05/2015 10:30 AM EST ----- Regarding: MRI results Pls let him know I reviewed MRI brain, it looks good, no evidence of tumor, stroke, or bleed. It does show age-related changes, and changes seen in patients with diabetes and hypertension. Continue better control of these conditions. Thanks

## 2015-02-10 ENCOUNTER — Ambulatory Visit (INDEPENDENT_AMBULATORY_CARE_PROVIDER_SITE_OTHER): Payer: Medicare Other

## 2015-02-10 DIAGNOSIS — Z23 Encounter for immunization: Secondary | ICD-10-CM

## 2015-02-15 ENCOUNTER — Ambulatory Visit: Payer: Medicare Other | Admitting: Family Medicine

## 2015-02-15 ENCOUNTER — Encounter: Payer: Self-pay | Admitting: Family Medicine

## 2015-02-15 ENCOUNTER — Ambulatory Visit (INDEPENDENT_AMBULATORY_CARE_PROVIDER_SITE_OTHER): Payer: Medicare Other | Admitting: Family Medicine

## 2015-02-15 VITALS — BP 120/70 | HR 76 | Temp 98.0°F | Resp 16 | Ht 71.0 in | Wt 351.0 lb

## 2015-02-15 DIAGNOSIS — R05 Cough: Secondary | ICD-10-CM | POA: Diagnosis not present

## 2015-02-15 DIAGNOSIS — R058 Other specified cough: Secondary | ICD-10-CM

## 2015-02-15 MED ORDER — ALBUTEROL SULFATE (2.5 MG/3ML) 0.083% IN NEBU
2.5000 mg | INHALATION_SOLUTION | Freq: Once | RESPIRATORY_TRACT | Status: AC
  Administered 2015-02-15: 2.5 mg via RESPIRATORY_TRACT

## 2015-02-15 MED ORDER — ALBUTEROL SULFATE HFA 108 (90 BASE) MCG/ACT IN AERS: 2.0000 | INHALATION_SPRAY | Freq: Four times a day (QID) | RESPIRATORY_TRACT | Status: DC | PRN

## 2015-02-15 MED ORDER — PREDNISONE 10 MG PO TABS: ORAL_TABLET | ORAL | Status: DC

## 2015-02-15 NOTE — Progress Notes (Signed)
Pre visit review using our clinic review tool, if applicable. No additional management support is needed unless otherwise documented below in the visit note. 

## 2015-02-15 NOTE — Assessment & Plan Note (Signed)
New.  Pt's cough- in absence of other sxs- is consistent w/ a post-viral cough.  Cough and air movement improved s/p neb tx.  No need for abx.  Pt to start Pred taper- cautioned him about elevated sugars due to his diabetes, albuterol inhaler, and he is to resume daily anti-histamine.  Reviewed supportive care and red flags that should prompt return.  Pt expressed understanding and is in agreement w/ plan.

## 2015-02-15 NOTE — Patient Instructions (Signed)
Follow up as needed Start the Prednisone taper today- take w/ food Use the inhaler if you have a coughing fit Restart Claritin or Zyrtec daily to decrease your congestion and post-nasal drip Call with any questions or concerns Hang in there!!!

## 2015-02-15 NOTE — Progress Notes (Signed)
   Subjective:    Patient ID: Gary Butler, male    DOB: [birth year], 70 y.o.   MRN: [id number]  HPI URI- 'i feel absolutely fine but I am coughing until this horrible stuff comes up'.  Not interfering w/ sleep.  No fevers.  Denies SOB.  Pt was sick earlier this fall- Oct- and improved w/ 2 rounds of Azithromycin.  + nasal congestion, sneezing.  Taking Zyrtec w/o improvement earlier in cough so pt stopped.   Review of Systems For ROS see HPI     Objective:   Physical Exam  Constitutional: He is oriented to person, place, and time. He appears well-developed and well-nourished. No distress.  obese  HENT:  Head: Normocephalic and atraumatic.  Right Ear: Tympanic membrane normal.  Left Ear: Tympanic membrane normal.  Nose: No mucosal edema or rhinorrhea. Right sinus exhibits no maxillary sinus tenderness and no frontal sinus tenderness. Left sinus exhibits no maxillary sinus tenderness and no frontal sinus tenderness.  Mouth/Throat: Mucous membranes are normal. No oropharyngeal exudate, posterior oropharyngeal edema or posterior oropharyngeal erythema.  Eyes: Conjunctivae and EOM are normal. Pupils are equal, round, and reactive to light.  Neck: Normal range of motion. Neck supple.  Cardiovascular: Normal rate, regular rhythm and normal heart sounds.   Pulmonary/Chest: Effort normal and breath sounds normal. No respiratory distress. He has no wheezes.  + hacking cough Decreased air movement- improved s/p neb tx, along w/ cough  Lymphadenopathy:    He has no cervical adenopathy.  Neurological: He is alert and oriented to person, place, and time.  Skin: Skin is warm and dry.  Vitals reviewed.         Assessment & Plan:

## 2015-02-16 ENCOUNTER — Ambulatory Visit: Payer: Medicare Other | Admitting: Family Medicine

## 2015-02-20 ENCOUNTER — Other Ambulatory Visit: Payer: Self-pay | Admitting: Family Medicine

## 2015-02-22 NOTE — Telephone Encounter (Signed)
Advise not on current medication list.

## 2015-03-09 ENCOUNTER — Ambulatory Visit (INDEPENDENT_AMBULATORY_CARE_PROVIDER_SITE_OTHER): Payer: Medicare Other | Admitting: Family Medicine

## 2015-03-09 ENCOUNTER — Encounter: Payer: Self-pay | Admitting: Family Medicine

## 2015-03-09 VITALS — BP 119/61 | HR 64 | Temp 97.6°F | Ht 71.0 in | Wt 353.4 lb

## 2015-03-09 DIAGNOSIS — M5416 Radiculopathy, lumbar region: Secondary | ICD-10-CM | POA: Diagnosis not present

## 2015-03-09 MED ORDER — TIZANIDINE HCL 4 MG PO TABS
4.0000 mg | ORAL_TABLET | Freq: Four times a day (QID) | ORAL | Status: DC | PRN
Start: 2015-03-09 — End: 2016-07-26

## 2015-03-09 MED ORDER — MELOXICAM 15 MG PO TABS: 15.0000 mg | ORAL_TABLET | Freq: Every day | ORAL | Status: DC

## 2015-03-09 NOTE — Progress Notes (Signed)
Pre visit review using our clinic review tool, if applicable. No additional management support is needed unless otherwise documented below in the visit note. 

## 2015-03-09 NOTE — Patient Instructions (Signed)
Follow up as needed Start the Mobic once daily for inflammation (take w/ food) Avoid other anti-inflammatories such as Aleve, ibuprofen, excedrin- tylenol is ok to take Use the Tizanidine for spasm Heating pad as needed for pain/spasm We'll call you with your physical therapy appt Call with any questions or concerns Hang in there!!!

## 2015-03-09 NOTE — Assessment & Plan Note (Signed)
New.  Pt's sxs and PE consistent w/ lumbar radiculopathy.  Discussed contribution of abdominal obesity to pt's sxs.  Start scheduled NSAIDs, muscle relaxer prn.  Heat.  Refer to PT for core strengthening and stabilization.  No red flags on hx or PE.  Will follow closely and may need to refer if no improvement.  Pt expressed understanding and is in agreement w/ plan.

## 2015-03-09 NOTE — Progress Notes (Signed)
   Subjective:    Patient ID: Gary Butler, male    DOB: February 08, 1946, 70 y.o.   MRN: [id number]  HPI Back pain- pt reports hx of similar on R sided that sent him to the hospital (12/03/14).  Pt was dx'd w/ sciatica, tx'd in ER w/ dilaudid, valium, phenergan.  D/c'd home w/ pain meds and prednisone.  Yesterday pain was on L side.  Today is better.  Pt doesn't recall doing heavy lifting or injury.  Radiation of pain is less than yesterday.  Pain now more localized.  Took Aleve w/ some relief last night.   Review of Systems For ROS see HPI     Objective:   Physical Exam  Constitutional: He is oriented to person, place, and time. He appears well-developed and well-nourished. No distress.  Morbidly obese  HENT:  Head: Normocephalic and atraumatic.  Cardiovascular: Intact distal pulses.   Musculoskeletal: He exhibits tenderness (TTP over L SI joint, no TTP over lumbar spine). He exhibits no edema.  Neurological: He is alert and oriented to person, place, and time. He has normal reflexes. Coordination normal.  (+) SLR bilaterally  Skin: Skin is warm and dry.  Psychiatric: He has a normal mood and affect. His behavior is normal. Thought content normal.  Vitals reviewed.         Assessment & Plan:

## 2015-03-15 ENCOUNTER — Ambulatory Visit: Payer: 59 | Attending: Family Medicine | Admitting: Physical Therapy

## 2015-03-15 DIAGNOSIS — R269 Unspecified abnormalities of gait and mobility: Secondary | ICD-10-CM | POA: Diagnosis present

## 2015-03-15 DIAGNOSIS — R293 Abnormal posture: Secondary | ICD-10-CM | POA: Diagnosis present

## 2015-03-15 DIAGNOSIS — M5442 Lumbago with sciatica, left side: Secondary | ICD-10-CM | POA: Insufficient documentation

## 2015-03-15 DIAGNOSIS — R29898 Other symptoms and signs involving the musculoskeletal system: Secondary | ICD-10-CM | POA: Diagnosis present

## 2015-03-15 DIAGNOSIS — M5441 Lumbago with sciatica, right side: Secondary | ICD-10-CM | POA: Insufficient documentation

## 2015-03-15 NOTE — Patient Instructions (Signed)
Hamstring Stretch (Standing)   **PERFORM THIS EXERCISE SITTING*    Sitting, place one heel on chair or bench. Use one or both hands on thigh for support. Keeping torso straight, lean forward slowly until a stretch is felt in back of same thigh. Hold __20-30__ seconds. Repeat with other leg.  Repeat 2-3 times on each leg.  Perform 2-3 sessions per day.   Copyright  VHI. All rights reserved.   Stretching: Calf - Towel    Sit with knee straight and towel looped around left foot. Gently pull on towel until stretch is felt in calf. Hold _20-30_ seconds.  Repeat with other leg. Repeat __2-3__ times per set. Do _1___ sets per session. Do _2-3___ sessions per day.  http://orth.exer.us/706   Copyright  VHI. All rights reserved.   Piriformis Stretch, Sitting    Sit, one ankle propped up on edge of bed or sofa, same-side hand on crossed knee. Push down on knee, keeping spine straight. Lean torso forward, with flat back, until tension is felt in hamstrings and gluteals of crossed-leg side. Hold _20-30__ seconds. Repeat to other side. Repeat _2-3__ times per session. Do _2-3__ sessions per day.  Copyright  VHI. All rights reserved.

## 2015-03-15 NOTE — Therapy (Signed)
[facility] [address]  [place], [place], [address] Phone: [phone]   Fax:  [phone]  Physical Therapy Evaluation  Patient Details  Name: Gary Butler MRN: [id number] Date of Birth: Mar 18, 1945 Referring Provider: Dr. Annye Asa  Encounter Date: 03/15/2015      PT End of Session - 03/15/15 1135    Visit Number 1   Number of Visits 12   Date for PT Re-Evaluation 04/26/15   PT Start Time 1032   PT Stop Time 1112   PT Time Calculation (min) 40 min   Activity Tolerance Patient tolerated treatment well   Behavior During Therapy [facility] for tasks assessed/performed      Past Medical History  Diagnosis Date  . Diabetes mellitus   . Hypertension   . OSA (obstructive sleep apnea)   . Arthritis   . Heart murmur   . Sleep apnea   . History of gout   . Psoriatic arthritis ([place])   . History of skin cancer   . Constipation   . Tinnitus     Past Surgical History  Procedure Laterality Date  . Addenoids    . Tonsillectomy    . Joint replacement  2012    RT KNEE  . Lung biopsy      20 YRS AGO  . Total knee arthroplasty Bilateral 06/22/2013    Procedure: LEFT TOTAL KNEE ARTHROPLASTY;  MEDIAL SOFT TISSUE EXPLORATION SAPHENOUS NEURECTOMY RIGHT KNEE;  Surgeon: Mauri Pole, MD;  Location: [facility];  Service: Orthopedics;  Laterality: Bilateral;    There were no vitals filed for this visit.  Visit Diagnosis:  Bilateral low back pain with sciatica, sciatica laterality unspecified - Plan: PT plan of care cert/re-cert  Weakness of both lower extremities - Plan: PT plan of care cert/re-cert  Abnormal posture - Plan: PT plan of care cert/re-cert  Abnormality of gait - Plan: PT plan of care cert/re-cert      Subjective Assessment - 03/15/15 1033    Subjective Pt is a 70 y/o male who presents to OPPT with LBP radiating down R side and progressing now to L side.  Pt reports hx of LBP x 1 year; onset of RLE pain  in Dec 2016 (went to ED due to pain); and LLE pain began last week.     Pertinent History obesity, DM, HTN, OA, heart murmur, gout   Limitations Sitting;Lifting;Standing;Walking;House hold activities   How long can you sit comfortably? unlimited in supportive chait; 20-30 min in unsupportive chair   How long can you stand comfortably? 20 min   How long can you walk comfortably? 30 min   Diagnostic tests none   Patient Stated Goals avoid repeat of pain that sent him to ED   Currently in Pain? No/denies   Pain Score --  up to 10/10   Pain Location Back   Pain Orientation Lower   Pain Descriptors / Indicators Sharp   Pain Type Chronic pain;Acute pain   Pain Radiating Towards bil LE   Pain Onset More than a month ago   Pain Frequency Intermittent   Aggravating Factors  turning; reaching; standing   Pain Relieving Factors tylenol            [facility] PT Assessment - 03/15/15 1029    Assessment   Medical Diagnosis LBP   Referring Provider Dr. Annye Asa   Onset Date/Surgical Date --  1 year ago   Next MD Visit PRN  Prior Therapy none for back, hx of bil TKA   Precautions   Precautions None   Restrictions   Weight Bearing Restrictions No   Balance Screen   Has the patient fallen in the past 6 months Yes   How many times? 1-rolling stool fell out from under pt   Has the patient had a decrease in activity level because of a fear of falling?  No   Is the patient reluctant to leave their home because of a fear of falling?  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)   Cognition   Overall Cognitive  Status Within Functional Limits for tasks assessed   Observation/Other Assessments   Focus on Therapeutic Outcomes (FOTO)  49 (51% limited; predicted 40% limited)   Posture/Postural Control   Posture/Postural Control Postural limitations   Postural Limitations Rounded Shoulders;Forward head;Increased thoracic kyphosis   AROM   Overall AROM Comments lumbar sidebending and rotation WNL; flexion and extension limited 50% with increased pain forward flexion   Strength   Overall Strength Comments other muscle groups not formally tested due to limited tolerance to testing postions, but suspect hip abduction and extension weakness and core weakness   Strength Assessment Site Hip;Knee;Ankle   Right Hip Flexion 4/5   Left Hip Flexion 4/5   Right/Left Knee Right;Left   Right Knee Extension 4+/5   Left Knee Extension 4+/5   Right Ankle Dorsiflexion 4/5   Left Ankle Dorsiflexion 4/5   Flexibility   Soft Tissue Assessment /Muscle Length yes  bil gastroc tightness   Hamstrings tightness bil   Piriformis tightness bil   Palpation   Spinal mobility tenderness L3/4-S1/2 with hypomobility noted; limited assessment due to limited ability to lie prone   SI assessment  tightness and tenderness along SIJ R>L   Special Tests    Special Tests Lumbar   Lumbar Tests Straight Leg Raise   Straight Leg Raise   Findings Negative   Side  --  bil   Ambulation/Gait   Gait Pattern Trendelenburg;Wide base of support                   [facility] Adult PT Treatment/Exercise - 03/15/15 1029    Exercises   Exercises Lumbar;Knee/Hip   Lumbar Exercises: Stretches   Passive Hamstring Stretch 1 rep;30 seconds   Passive Hamstring Stretch Limitations seated with mod cues for correct technique; bil   Piriformis Stretch 1 rep;30 seconds   Piriformis Stretch Limitations seated and modified due to inablility to bring foot up to knee; pt positioned turned on mat   Knee/Hip Exercises: Stretches   Gastroc Stretch  Both;1 rep;30 seconds   Gastroc Stretch Limitations seated with sheet and with hamstring stretch                PT Education - 03/15/15 1135    Education provided Yes   Education Details HEP, clinical findings and POC/goals of care   Person(s) Educated Patient   Methods Explanation;Demonstration;Handout   Comprehension Verbalized understanding;Returned demonstration;Need further instruction             PT  Long Term Goals - 03-23-15 1153    PT LONG TERM GOAL #1   Title independent with HEP (04/26/15)   Time 6   Period Weeks   Status New   PT LONG TERM GOAL #2   Title report ability to stand/ambulate > 45 min without increase in pain for improved function and ability to perform job/volunteer responsibilities (04/26/15)   Time 6   Period Weeks   Status New   PT LONG TERM GOAL #3   Title report ability to sit > 30 min without increase in pain for improved tolerance (04/26/15)   Time 6   Period Weeks   Status New   PT LONG TERM GOAL #4   Title report no pain into either lower extremity for improved function (04/26/15)   Time 6   Period Weeks   Status New               Plan - 03/23/15 1140    Clinical Impression Statement Pt is a 70 y/o male who presents to OPPT for a moderate complexity evaluation for LBP with recent onset of radicular symptoms into R and LLE.  Pt demonstrates decreased activity tolerance with difficulty with ADLs due to pain, decreased strength and ROM.  Pt will benefit from PT to maximize function.  Unable to reproduce radicular symptoms today in clinc but recommended trial of repeated extension if pt has symptoms.  Advised to stop exercise if pain increases.  Pt demonstrated tightness in lower extremities and therefore HEP initiated to address this.     Pt will benefit from skilled therapeutic intervention in order to improve on the following deficits Abnormal gait;Postural dysfunction;Pain;Obesity;Decreased range of motion;Impaired  flexibility;Decreased mobility;Decreased activity tolerance;Decreased strength   Rehab Potential Good   PT Frequency 2x / week   PT Duration 6 weeks   PT Treatment/Interventions ADLs/Self Care Home Management;Cryotherapy;Electrical Stimulation;Moist Heat;Traction;Ultrasound;Patient/family education;Neuromuscular re-education;Therapeutic exercise;Therapeutic activities;Functional mobility training;Gait training;Manual techniques   PT Next Visit Plan review HEP; reassess pain; hip/core strengthening; modalities and traction PRN   Consulted and Agree with Plan of Care Patient          G-Codes - Mar 23, 2015 1157    Functional Assessment Tool Used FOTO 49 (51% limited)   Functional Limitation Mobility: Walking and moving around   Mobility: Walking and Moving Around Current Status [id number]) At least 40 percent but less than 60 percent impaired, limited or restricted   Mobility: Walking and Moving Around Goal Status [id number]) At least 40 percent but less than 60 percent impaired, limited or restricted       Problem List Patient Active Problem List   Diagnosis Date Noted  . Lumbar radiculopathy, acute 03/09/2015  . Post-viral cough syndrome 02/15/2015  . Essential hypertension 12/27/2014  . Type 2 diabetes mellitus without complication, without long-term current use of insulin ([place]) 12/27/2014  . Non-suppurative otitis media 06/28/2014  . Acute bronchitis 06/28/2014  . Mild aortic stenosis 06/28/2014  . Memory loss 04/29/2014  . Tinnitus of both ears 02/17/2014  . Wellness examination 12/04/2013  . Constipation due to slow transit 08/18/2013  . Morbid obesity ([place]) 06/23/2013  . S/P left TKA 06/22/2013  . Meniere disease 04/21/2013  . Benign paroxysmal positional vertigo 04/21/2013  . Psoriatic arthritis ([place]) 05/14/2012  . General medical exam 01/28/2012  . Cellulitis of eyelid 09/25/2011  . Diverticulitis 06/20/2011  . Erythrocytosis 05/18/2011  . Sinusitis, acute 03/14/2011  . S/P  TKR (total knee replacement) 12/31/2010  . Obesity 12/31/2010  . Hyperlipidemia 12/22/2010  .  BPH (benign prostatic hyperplasia) 12/22/2010  . Gout 12/22/2010  . DM 04/04/2009  . Obstructive sleep apnea 04/04/2009  . HYPERTENSION 04/04/2009   Laureen Abrahams, PT, DPT 03/15/2015 12:00 PM  [facility] [address]  [place], [place], [address] Phone: [phone]   Fax:  [phone]  Name: Froylan Marolt MRN: [id number] Date of Birth: 09/03/1945

## 2015-03-21 ENCOUNTER — Ambulatory Visit: Payer: 59 | Attending: Family Medicine | Admitting: Physical Therapy

## 2015-03-21 DIAGNOSIS — R29898 Other symptoms and signs involving the musculoskeletal system: Secondary | ICD-10-CM | POA: Diagnosis present

## 2015-03-21 DIAGNOSIS — M5442 Lumbago with sciatica, left side: Secondary | ICD-10-CM | POA: Diagnosis present

## 2015-03-21 DIAGNOSIS — R269 Unspecified abnormalities of gait and mobility: Secondary | ICD-10-CM | POA: Diagnosis present

## 2015-03-21 DIAGNOSIS — R293 Abnormal posture: Secondary | ICD-10-CM | POA: Diagnosis present

## 2015-03-21 DIAGNOSIS — M5441 Lumbago with sciatica, right side: Secondary | ICD-10-CM

## 2015-03-21 NOTE — Patient Instructions (Signed)
Pelvic Tilt    Flatten back by tightening stomach muscles and buttocks. Hold for 3 seconds Repeat _10___ times per set. Do __1__ sets per session. Do _2-3___ sessions per day.  http://orth.exer.us/134   Copyright  VHI. All rights reserved.   Bridging    Slowly raise buttocks from floor, keeping stomach tight. Hold for 3 seconds. Repeat __10__ times per set. Do __1__ sets per session. Do __2-3__ sessions per day.  http://orth.exer.us/1096   Copyright  VHI. All rights reserved.

## 2015-03-21 NOTE — Therapy (Signed)
[facility] [address]  [place], [place], [address] Phone: [phone]   Fax:  [phone]  Physical Therapy Treatment  Patient Details  Name: Gary Butler MRN: [id number] Date of Birth: Feb 12, 1946 Referring Provider: Dr. Annye Asa  Encounter Date: 03/21/2015      PT End of Session - 03/21/15 1115    Visit Number 2   Number of Visits 12   Date for PT Re-Evaluation 04/26/15   PT Start Time 1030   PT Stop Time 1112   PT Time Calculation (min) 42 min   Activity Tolerance Patient tolerated treatment well   Behavior During Therapy [facility] for tasks assessed/performed      Past Medical History  Diagnosis Date  . Diabetes mellitus   . Hypertension   . OSA (obstructive sleep apnea)   . Arthritis   . Heart murmur   . Sleep apnea   . History of gout   . Psoriatic arthritis ([place])   . History of skin cancer   . Constipation   . Tinnitus     Past Surgical History  Procedure Laterality Date  . Addenoids    . Tonsillectomy    . Joint replacement  2012    RT KNEE  . Lung biopsy      20 YRS AGO  . Total knee arthroplasty Bilateral 06/22/2013    Procedure: LEFT TOTAL KNEE ARTHROPLASTY;  MEDIAL SOFT TISSUE EXPLORATION SAPHENOUS NEURECTOMY RIGHT KNEE;  Surgeon: Mauri Pole, MD;  Location: [facility];  Service: Orthopedics;  Laterality: Bilateral;    There were no vitals filed for this visit.  Visit Diagnosis:  Bilateral low back pain with sciatica, sciatica laterality unspecified  Weakness of both lower extremities  Abnormal posture  Abnormality of gait      Subjective Assessment - 03/21/15 1035    Subjective Doing well, went to show last night for ~ 2 hours; sat the whole time without pain (only had to reposition).  Feels lumbar extension is really helping   Patient Stated Goals avoid repeat of pain that sent him to ED   Currently in Pain? Yes   Pain Score 5    Pain Location Back   Pain Orientation  Lower   Pain Descriptors / Indicators Aching   Pain Type Chronic pain;Acute pain   Pain Onset More than a month ago   Pain Frequency Intermittent   Aggravating Factors  turning, reaching, standing   Pain Relieving Factors tylenol, lumbar extension                         [facility] Adult PT Treatment/Exercise - 03/21/15 1038    Lumbar Exercises: Stretches   Passive Hamstring Stretch 2 reps;30 seconds   Passive Hamstring Stretch Limitations seated with mod cues for correct technique; bil   Piriformis Stretch 2 reps;30 seconds   Piriformis Stretch Limitations seated and modified due to inablility to bring foot up to knee; pt positioned turned on mat   Lumbar Exercises: Aerobic   Stationary Bike NuStep L 3 x 5 min   Lumbar Exercises: Standing   Other Standing Lumbar Exercises standing extension x 15 at counter   Lumbar Exercises: Seated   Long Arc Quad on Chair Both;10 reps   Sit to Stand 10 reps   Sit to Stand Limitations without UE support   Lumbar Exercises: Supine   Ab Set 10 reps;3 seconds   AB Set Limitations mod cues for  technique   Clam 10 reps   Clam Limitations with ab set and green theraband   Bridge 10 reps;3 seconds   Other Supine Lumbar Exercises isometric hip extension x 10 bil alternating   Knee/Hip Exercises: Seated   Long Arc Quad --   Sit to [employer] --                PT Education - 03/21/15 1115    Education provided Yes   Education Details HEP   Person(s) Educated Patient   Methods Explanation;Demonstration;Handout   Comprehension Verbalized understanding;Returned demonstration;Need further instruction             PT Long Term Goals - 03/15/15 1153    PT LONG TERM GOAL #1   Title independent with HEP (04/26/15)   Time 6   Period Weeks   Status New   PT LONG TERM GOAL #2   Title report ability to stand/ambulate > 45 min without increase in pain for improved function and ability to perform job/volunteer responsibilities (04/26/15)    Time 6   Period Weeks   Status New   PT LONG TERM GOAL #3   Title report ability to sit > 30 min without increase in pain for improved tolerance (04/26/15)   Time 6   Period Weeks   Status New   PT LONG TERM GOAL #4   Title report no pain into either lower extremity for improved function (04/26/15)   Time 6   Period Weeks   Status New               Plan - 03/21/15 1115    Clinical Impression Statement Pt reports centralization of back pain and no radicular symptoms since evaluation.  Pt feels repeated extension helpful.  Added exercises for core and hip strengthening to HEP today which pt tolerated well.   PT Next Visit Plan review HEP; reassess pain; hip/core strengthening; modalities and traction PRN   Consulted and Agree with Plan of Care Patient        Problem List Patient Active Problem List   Diagnosis Date Noted  . Lumbar radiculopathy, acute 03/09/2015  . Post-viral cough syndrome 02/15/2015  . Essential hypertension 12/27/2014  . Type 2 diabetes mellitus without complication, without long-term current use of insulin ([place]) 12/27/2014  . Non-suppurative otitis media 06/28/2014  . Acute bronchitis 06/28/2014  . Mild aortic stenosis 06/28/2014  . Memory loss 04/29/2014  . Tinnitus of both ears 02/17/2014  . Wellness examination 12/04/2013  . Constipation due to slow transit 08/18/2013  . Morbid obesity ([place]) 06/23/2013  . S/P left TKA 06/22/2013  . Meniere disease 04/21/2013  . Benign paroxysmal positional vertigo 04/21/2013  . Psoriatic arthritis ([place]) 05/14/2012  . General medical exam 01/28/2012  . Cellulitis of eyelid 09/25/2011  . Diverticulitis 06/20/2011  . Erythrocytosis 05/18/2011  . Sinusitis, acute 03/14/2011  . S/P TKR (total knee replacement) 12/31/2010  . Obesity 12/31/2010  . Hyperlipidemia 12/22/2010  . BPH (benign prostatic hyperplasia) 12/22/2010  . Gout 12/22/2010  . DM 04/04/2009  . Obstructive sleep apnea 04/04/2009  .  HYPERTENSION 04/04/2009   Laureen Abrahams, PT, DPT 03/21/2015 11:28 AM  [facility] [address]  [place], [place], [address] Phone: [phone]   Fax:  [phone]  Name: Gary Butler MRN: [id number] Date of Birth: 1945/09/24

## 2015-03-28 ENCOUNTER — Ambulatory Visit: Payer: 59 | Admitting: Physical Therapy

## 2015-03-28 DIAGNOSIS — M5441 Lumbago with sciatica, right side: Secondary | ICD-10-CM

## 2015-03-28 DIAGNOSIS — M5442 Lumbago with sciatica, left side: Principal | ICD-10-CM

## 2015-03-28 DIAGNOSIS — R293 Abnormal posture: Secondary | ICD-10-CM

## 2015-03-28 DIAGNOSIS — R269 Unspecified abnormalities of gait and mobility: Secondary | ICD-10-CM

## 2015-03-28 DIAGNOSIS — R29898 Other symptoms and signs involving the musculoskeletal system: Secondary | ICD-10-CM

## 2015-03-28 NOTE — Therapy (Signed)
[facility] [address]  [place], [place], [address] Phone: [phone]   Fax:  [phone]  Physical Therapy Treatment  Patient Details  Name: Gary Butler MRN: [id number] Date of Birth: [birth year] Referring Provider: Dr. Annye Asa  Encounter Date: 03/28/2015      PT End of Session - 03/28/15 1107    Visit Number 3   Number of Visits 12   Date for PT Re-Evaluation 04/26/15   PT Start Time 1030   PT Stop Time 1110   PT Time Calculation (min) 40 min   Activity Tolerance Patient tolerated treatment well   Behavior During Therapy [facility] for tasks assessed/performed      Past Medical History  Diagnosis Date  . Diabetes mellitus   . Hypertension   . OSA (obstructive sleep apnea)   . Arthritis   . Heart murmur   . Sleep apnea   . History of gout   . Psoriatic arthritis ([place])   . History of skin cancer   . Constipation   . Tinnitus     Past Surgical History  Procedure Laterality Date  . Addenoids    . Tonsillectomy    . Joint replacement  2012    RT KNEE  . Lung biopsy      20 YRS AGO  . Total knee arthroplasty Bilateral 06/22/2013    Procedure: LEFT TOTAL KNEE ARTHROPLASTY;  MEDIAL SOFT TISSUE EXPLORATION SAPHENOUS NEURECTOMY RIGHT KNEE;  Surgeon: Mauri Pole, MD;  Location: [facility];  Service: Orthopedics;  Laterality: Bilateral;    There were no vitals filed for this visit.  Visit Diagnosis:  Bilateral low back pain with sciatica, sciatica laterality unspecified  Weakness of both lower extremities  Abnormal posture  Abnormality of gait      Subjective Assessment - 03/28/15 1032    Subjective Pt reported losing exercise sheet and requested a reprint. Pt also reported "doing it in" this weekend after going to the circus and walking around the entire time.    Currently in Pain? Yes   Pain Score 7    Pain Location Knee   Pain Orientation Right;Left   Pain Descriptors / Indicators Sharp   Pain Type Chronic pain   Pain Onset More than a month ago   Multiple Pain Sites Yes   Pain Score 5   Pain Location Back   Pain Orientation Left   Pain Descriptors / Indicators Dull                         [facility] Adult PT Treatment/Exercise - 03/28/15 1044    Lumbar Exercises: Stretches   Active Hamstring Stretch --   Active Hamstring Stretch Limitations --   Lumbar Exercises: Aerobic   Stationary Bike NuStep L 4 x 6 min   Lumbar Exercises: Standing   Other Standing Lumbar Exercises lumbar extension over counter x 20   Knee/Hip Exercises: Stretches   Passive Hamstring Stretch Both;3 reps;30 seconds   Passive Hamstring Stretch Limitations with belt    Gastroc Stretch --   Gastroc Stretch Limitations --   Knee/Hip Exercises: Supine   Short Arc Quad Sets Strengthening;Both;1 set;15 reps   Short Arc Quad Sets Limitations 2#   Bridges Limitations 15 reps    Other Supine Knee/Hip Exercises pelvic tilts x 15 each way with cue to go further with the anterior tilt    Other Supine Knee/Hip Exercises transverse abdominus contraction with marching in  hooklying                PT Education - 03/28/15 1112    Education provided Yes   Education Details transverse abdominus contraction technique x 10, cues for breathing   Person(s) Educated Patient   Methods Explanation   Comprehension Verbalized understanding;Returned demonstration             PT Long Term Goals - 03/15/15 1153    PT LONG TERM GOAL #1   Title independent with HEP (04/26/15)   Time 6   Period Weeks   Status New   PT LONG TERM GOAL #2   Title report ability to stand/ambulate > 45 min without increase in pain for improved function and ability to perform job/volunteer responsibilities (04/26/15)   Time 6   Period Weeks   Status New   PT LONG TERM GOAL #3   Title report ability to sit > 30 min without increase in pain for improved tolerance (04/26/15)   Time 6   Period Weeks   Status New    PT LONG TERM GOAL #4   Title report no pain into either lower extremity for improved function (04/26/15)   Time 6   Period Weeks   Status New               Plan - 03/28/15 1212    Clinical Impression Statement Pt increased activity this weekend and reported increased knee pain to 7/10 and back pain 5/10. Pt also lost HEP so reported not doing any of the exercises and asked for a new copy so that he could begin doing them as recommended. Session focused on core and knee strengthening. Pt had some difficulty with core stabilization techniques especially with maintaining proper breathing techniques. Pt would continue to benefit from progressed core strengthening in supine and eccentric quad control. Overall pt tolerated session well.    PT Next Visit Plan review HEP; reassess pain; hip/core/knee strengthening; modalities and traction PRN   Consulted and Agree with Plan of Care Patient        Problem List Patient Active Problem List   Diagnosis Date Noted  . Lumbar radiculopathy, acute 03/09/2015  . Post-viral cough syndrome 02/15/2015  . Essential hypertension 12/27/2014  . Type 2 diabetes mellitus without complication, without long-term current use of insulin ([place]) 12/27/2014  . Non-suppurative otitis media 06/28/2014  . Acute bronchitis 06/28/2014  . Mild aortic stenosis 06/28/2014  . Memory loss 04/29/2014  . Tinnitus of both ears 02/17/2014  . Wellness examination 12/04/2013  . Constipation due to slow transit 08/18/2013  . Morbid obesity ([place]) 06/23/2013  . S/P left TKA 06/22/2013  . Meniere disease 04/21/2013  . Benign paroxysmal positional vertigo 04/21/2013  . Psoriatic arthritis ([place]) 05/14/2012  . General medical exam 01/28/2012  . Cellulitis of eyelid 09/25/2011  . Diverticulitis 06/20/2011  . Erythrocytosis 05/18/2011  . Sinusitis, acute 03/14/2011  . S/P TKR (total knee replacement) 12/31/2010  . Obesity 12/31/2010  . Hyperlipidemia 12/22/2010  . BPH  (benign prostatic hyperplasia) 12/22/2010  . Gout 12/22/2010  . DM 04/04/2009  . Obstructive sleep apnea 04/04/2009  . HYPERTENSION 04/04/2009    Ammie Ferrier, SPT 03/28/2015  12:57 PM  [facility] [address]  [place], [place], [address] Phone: [phone]   Fax:  [phone]  Name: Gary Butler MRN: [id number] Date of Birth: 12/29/1945

## 2015-03-29 ENCOUNTER — Other Ambulatory Visit: Payer: Self-pay

## 2015-03-29 MED ORDER — ALLOPURINOL 300 MG PO TABS: 300.0000 mg | ORAL_TABLET | Freq: Every day | ORAL | Status: DC

## 2015-03-31 ENCOUNTER — Ambulatory Visit: Payer: 59 | Admitting: Physical Therapy

## 2015-03-31 DIAGNOSIS — M5441 Lumbago with sciatica, right side: Secondary | ICD-10-CM | POA: Diagnosis not present

## 2015-03-31 DIAGNOSIS — R29898 Other symptoms and signs involving the musculoskeletal system: Secondary | ICD-10-CM

## 2015-03-31 DIAGNOSIS — R293 Abnormal posture: Secondary | ICD-10-CM

## 2015-03-31 DIAGNOSIS — M5442 Lumbago with sciatica, left side: Principal | ICD-10-CM

## 2015-03-31 DIAGNOSIS — R269 Unspecified abnormalities of gait and mobility: Secondary | ICD-10-CM

## 2015-03-31 NOTE — Therapy (Signed)
[facility] [place] [facility] [address]  [place], [place], [address] Phone: [phone]   Fax:  [phone]  Physical Therapy Treatment  Patient Details  Name: Gary Butler MRN: [id number] Date of Birth: 28-Aug-1945 Referring Provider: Dr. Annye Asa  Encounter Date: 03/31/2015      PT End of Session - 03/31/15 1538    Visit Number 4   Number of Visits 12   Date for PT Re-Evaluation 04/26/15   PT Start Time [id number]   PT Stop Time 1526   PT Time Calculation (min) 42 min   Activity Tolerance Patient tolerated treatment well   Behavior During Therapy [facility] for tasks assessed/performed      Past Medical History  Diagnosis Date  . Diabetes mellitus   . Hypertension   . OSA (obstructive sleep apnea)   . Arthritis   . Heart murmur   . Sleep apnea   . History of gout   . Psoriatic arthritis ([place])   . History of skin cancer   . Constipation   . Tinnitus     Past Surgical History  Procedure Laterality Date  . Addenoids    . Tonsillectomy    . Joint replacement  2012    RT KNEE  . Lung biopsy      20 YRS AGO  . Total knee arthroplasty Bilateral 06/22/2013    Procedure: LEFT TOTAL KNEE ARTHROPLASTY;  MEDIAL SOFT TISSUE EXPLORATION SAPHENOUS NEURECTOMY RIGHT KNEE;  Surgeon: Mauri Pole, MD;  Location: [facility];  Service: Orthopedics;  Laterality: Bilateral;    There were no vitals filed for this visit.  Visit Diagnosis:  Bilateral low back pain with sciatica, sciatica laterality unspecified  Weakness of both lower extremities  Abnormal posture  Abnormality of gait      Subjective Assessment - 03/31/15 1447    Subjective Pt reports that knees continue to be more painful than the back. "There are little pains here and there".   Currently in Pain? Yes   Pain Score 6    Pain Location Knee   Pain Orientation Right;Left   Pain Descriptors / Indicators Sharp   Pain Type Chronic pain   Pain Onset More than a month ago    Pain Frequency Intermittent   Pain Score 2   Pain Location Back                         [facility] Adult PT Treatment/Exercise - 03/31/15 1458    Lumbar Exercises: Stretches   Passive Hamstring Stretch 3 reps;30 seconds   Passive Hamstring Stretch Limitations manually performed pt hooklying and SLR   Single Knee to Chest Stretch 3 reps;30 seconds   Single Knee to Chest Stretch Limitations manually performed, pain with knee flexion and knees tender to touch   Lower Trunk Rotation 10 seconds;5 reps   Lower Trunk Rotation Limitations pt had to be directed on going all the way from Lt to Rt   Piriformis Stretch 3 reps;30 seconds   Piriformis Stretch Limitations manually performed   Lumbar Exercises: Aerobic   Elliptical --   Lumbar Exercises: Seated   Long Arc Quad on Chair Strengthening;Both;1 set;10 reps   LAQ on Chair Weights (lbs) 2   LAQ on Chair Limitations with ball squeeze   Lumbar Exercises: Supine   Ab Set 20 reps   AB Set Limitations with hip flexion marches, cue to breathe in through nose and out through mouth, attempted  holding hip flexion bil which increased LBP so stopped at 8 reps   Knee/Hip Exercises: Stretches   Gastroc Stretch 3 reps;20 seconds   Gastroc Stretch Limitations standing on 2" step with bil UE support on prostretch, cue to stand up tall   Knee/Hip Exercises: Aerobic   Nustep L3 x 2 min, L4 x 2 min   Knee/Hip Exercises: Supine   Short Arc Quad Sets Strengthening;Both;1 set;10 reps   Short Arc Quad Sets Limitations 2#                PT Education - 03/31/15 1537    Education provided Yes   Education Details lower trunk rotations for HEP   Person(s) Educated Patient   Methods Explanation   Comprehension Verbalized understanding;Returned demonstration;Need further instruction             PT Long Term Goals - 03/31/15 1547    PT LONG TERM GOAL #1   Title independent with HEP (04/26/15)   Time 6   Period Weeks   Status  On-going   PT LONG TERM GOAL #2   Title report ability to stand/ambulate > 45 min without increase in pain for improved function and ability to perform job/volunteer responsibilities (04/26/15)   Time 6   Period Weeks   Status On-going   PT LONG TERM GOAL #3   Title report ability to sit > 30 min without increase in pain for improved tolerance (04/26/15)   Time 6   Period Weeks   Status On-going   PT LONG TERM GOAL #4   Title report no pain into either lower extremity for improved function (04/26/15)   Time 6   Period Weeks   Status On-going               Plan - 03/31/15 1538    Clinical Impression Statement Pt had decrease tolerance to aerobic exercise due to knee pain and decreased tolerance to progressed core strengthening due to increasing LBP. Focused session on manually stretching bil LE and lower back region to decrease tightness of musculature. Pt reported no questions with HEP and that the exercises seem to be helping.   PT Next Visit Plan reassess pain; hip/core/knee strengthening; modalities and traction PRN   Consulted and Agree with Plan of Care Patient        Problem List Patient Active Problem List   Diagnosis Date Noted  . Lumbar radiculopathy, acute 03/09/2015  . Post-viral cough syndrome 02/15/2015  . Essential hypertension 12/27/2014  . Type 2 diabetes mellitus without complication, without long-term current use of insulin ([place]) 12/27/2014  . Non-suppurative otitis media 06/28/2014  . Acute bronchitis 06/28/2014  . Mild aortic stenosis 06/28/2014  . Memory loss 04/29/2014  . Tinnitus of both ears 02/17/2014  . Wellness examination 12/04/2013  . Constipation due to slow transit 08/18/2013  . Morbid obesity ([place]) 06/23/2013  . S/P left TKA 06/22/2013  . Meniere disease 04/21/2013  . Benign paroxysmal positional vertigo 04/21/2013  . Psoriatic arthritis ([place]) 05/14/2012  . General medical exam 01/28/2012  . Cellulitis of eyelid 09/25/2011  .  Diverticulitis 06/20/2011  . Erythrocytosis 05/18/2011  . Sinusitis, acute 03/14/2011  . S/P TKR (total knee replacement) 12/31/2010  . Obesity 12/31/2010  . Hyperlipidemia 12/22/2010  . BPH (benign prostatic hyperplasia) 12/22/2010  . Gout 12/22/2010  . DM 04/04/2009  . Obstructive sleep apnea 04/04/2009  . HYPERTENSION 04/04/2009    Ammie Ferrier, SPT 03/31/2015  4:14 PM   [facility]  [place] [facility] [address]  [place], [place], [address] Phone: [phone]   Fax:  [phone]  Name: Gary Butler MRN: [id number] Date of Birth: 12-17-1945

## 2015-04-05 ENCOUNTER — Ambulatory Visit: Payer: 59 | Admitting: Physical Therapy

## 2015-04-05 DIAGNOSIS — R293 Abnormal posture: Secondary | ICD-10-CM

## 2015-04-05 DIAGNOSIS — R269 Unspecified abnormalities of gait and mobility: Secondary | ICD-10-CM

## 2015-04-05 DIAGNOSIS — M5441 Lumbago with sciatica, right side: Secondary | ICD-10-CM

## 2015-04-05 DIAGNOSIS — R29898 Other symptoms and signs involving the musculoskeletal system: Secondary | ICD-10-CM

## 2015-04-05 DIAGNOSIS — M5442 Lumbago with sciatica, left side: Principal | ICD-10-CM

## 2015-04-05 NOTE — Therapy (Signed)
[facility] [facility] [address]  [place], [place], [address] Phone: [phone]   Fax:  [phone]  Physical Therapy Treatment  Patient Details  Name: Gary Butler MRN: [id number] Date of Birth: [birth year] Referring Provider: Dr. Annye Asa  Encounter Date: 04/05/2015      PT End of Session - 04/05/15 1119    Visit Number 5   Number of Visits 12   Date for PT Re-Evaluation 04/26/15   PT Start Time 1030   PT Stop Time 1108   PT Time Calculation (min) 38 min   Activity Tolerance Patient tolerated treatment well   Behavior During Therapy [facility] for tasks assessed/performed      Past Medical History  Diagnosis Date  . Diabetes mellitus   . Hypertension   . OSA (obstructive sleep apnea)   . Arthritis   . Heart murmur   . Sleep apnea   . History of gout   . Psoriatic arthritis ([place])   . History of skin cancer   . Constipation   . Tinnitus     Past Surgical History  Procedure Laterality Date  . Addenoids    . Tonsillectomy    . Joint replacement  2012    RT KNEE  . Lung biopsy      20 YRS AGO  . Total knee arthroplasty Bilateral 06/22/2013    Procedure: LEFT TOTAL KNEE ARTHROPLASTY;  MEDIAL SOFT TISSUE EXPLORATION SAPHENOUS NEURECTOMY RIGHT KNEE;  Surgeon: Mauri Pole, MD;  Location: [facility];  Service: Orthopedics;  Laterality: Bilateral;    There were no vitals filed for this visit.  Visit Diagnosis:  Bilateral low back pain with sciatica, sciatica laterality unspecified  Weakness of both lower extremities  Abnormal posture  Abnormality of gait      Subjective Assessment - 04/05/15 1033    Subjective Pt reported that he has minimal pain today and that he has lost 10 pounds in his first week of a new diet program.   Currently in Pain? Yes   Pain Score 3    Pain Location Knee   Pain Orientation Right;Left   Pain Descriptors / Indicators Aching   Pain Type Chronic pain   Pain Onset More than a  month ago                         [facility] Adult PT Treatment/Exercise - 04/05/15 1035    Lumbar Exercises: Supine   Bridge 20 reps;3 seconds   Bridge Limitations pt cued to hold during second set of 10 and to breathe throughout   Knee/Hip Exercises: Seated   Long Arc Quad Strengthening;Both;2 sets;10 reps  in chair with back support to prevent lumbar extension   Long Arc Quad Weight 3 lbs.   Other Seated Knee/Hip Exercises fitter with 2 black single leg press bil 2x10  pt reported that felt good    Hamstring Curl Strengthening;Both;2 sets;10 reps   Hamstring Limitations green TB, pain along middle of leg   Knee/Hip Exercises: Supine   Straight Leg Raises Strengthening;Both;2 sets;10 reps   Straight Leg Raises Limitations some extensor lag, correctable with cueing at beginning though continuous with more reps   Straight Leg Raise with External Rotation Strengthening;Both;1 set;10 reps   Straight Leg Raise with External Rotation Limitations pt unable to maintain bil ER and greater extensor lag than with previous SLR set   Other Supine Knee/Hip Exercises pelvic tilts x 20 each way  with cue to go further with the anterior tilt    Other Supine Knee/Hip Exercises single leg clam bil 2x10 each, green TB and trans ab contraction                PT Education - 04/05/15 1116    Education provided No             PT Long Term Goals - 03/31/15 1547    PT LONG TERM GOAL #1   Title independent with HEP (04/26/15)   Time 6   Period Weeks   Status On-going   PT LONG TERM GOAL #2   Title report ability to stand/ambulate > 45 min without increase in pain for improved function and ability to perform job/volunteer responsibilities (04/26/15)   Time 6   Period Weeks   Status On-going   PT LONG TERM GOAL #3   Title report ability to sit > 30 min without increase in pain for improved tolerance (04/26/15)   Time 6   Period Weeks   Status On-going   PT LONG TERM GOAL #4    Title report no pain into either lower extremity for improved function (04/26/15)   Time 6   Period Weeks   Status On-going               Plan - 04/05/15 1120    Clinical Impression Statement Pt tolerated all exercises well though required a lot of cueing to slow down throughout all exercises. Pt still demonstrates breath holding with the more strenuous exercises. Knee pain increased during session to a 5/10. Knee pain limits aerobic and strengthening exercises. Discussed trying e-stim for the knees next session to aid with pain control and exercise endurance.    PT Next Visit Plan core/hip/knee strengthening; progress HEP; e-stim for the knees PRN   Consulted and Agree with Plan of Care Patient        Problem List Patient Active Problem List   Diagnosis Date Noted  . Lumbar radiculopathy, acute 03/09/2015  . Post-viral cough syndrome 02/15/2015  . Essential hypertension 12/27/2014  . Type 2 diabetes mellitus without complication, without long-term current use of insulin ([place]) 12/27/2014  . Non-suppurative otitis media 06/28/2014  . Acute bronchitis 06/28/2014  . Mild aortic stenosis 06/28/2014  . Memory loss 04/29/2014  . Tinnitus of both ears 02/17/2014  . Wellness examination 12/04/2013  . Constipation due to slow transit 08/18/2013  . Morbid obesity ([place]) 06/23/2013  . S/P left TKA 06/22/2013  . Meniere disease 04/21/2013  . Benign paroxysmal positional vertigo 04/21/2013  . Psoriatic arthritis ([place]) 05/14/2012  . General medical exam 01/28/2012  . Cellulitis of eyelid 09/25/2011  . Diverticulitis 06/20/2011  . Erythrocytosis 05/18/2011  . Sinusitis, acute 03/14/2011  . S/P TKR (total knee replacement) 12/31/2010  . Obesity 12/31/2010  . Hyperlipidemia 12/22/2010  . BPH (benign prostatic hyperplasia) 12/22/2010  . Gout 12/22/2010  . DM 04/04/2009  . Obstructive sleep apnea 04/04/2009  . HYPERTENSION 04/04/2009    Ammie Ferrier, SPT 04/05/2015  11:26  AM  [facility] [place] [address]  [place], [place], [address] Phone: [phone]   Fax:  [phone]  Name: Gary Butler MRN: [id number] Date of Birth: [birth year]

## 2015-04-07 ENCOUNTER — Ambulatory Visit: Payer: 59 | Admitting: Physical Therapy

## 2015-04-07 DIAGNOSIS — M5441 Lumbago with sciatica, right side: Secondary | ICD-10-CM | POA: Diagnosis not present

## 2015-04-07 DIAGNOSIS — R269 Unspecified abnormalities of gait and mobility: Secondary | ICD-10-CM

## 2015-04-07 DIAGNOSIS — R293 Abnormal posture: Secondary | ICD-10-CM

## 2015-04-07 DIAGNOSIS — M5442 Lumbago with sciatica, left side: Principal | ICD-10-CM

## 2015-04-07 DIAGNOSIS — R29898 Other symptoms and signs involving the musculoskeletal system: Secondary | ICD-10-CM

## 2015-04-07 NOTE — Therapy (Signed)
[facility] [facility] [address]  [place], [place], [address] Phone: [phone]   Fax:  [phone]  Physical Therapy Treatment  Patient Details  Name: Gary Butler MRN: [id number] Date of Birth: [birth year] Referring Provider: Dr. Annye Asa  Encounter Date: 04/07/2015      PT End of Session - 04/07/15 1115    Visit Number 6   Number of Visits 12   Date for PT Re-Evaluation 04/26/15   PT Start Time 1036   PT Stop Time 1127   PT Time Calculation (min) 51 min   Activity Tolerance Patient tolerated treatment well   Behavior During Therapy [facility] for tasks assessed/performed      Past Medical History  Diagnosis Date  . Diabetes mellitus   . Hypertension   . OSA (obstructive sleep apnea)   . Arthritis   . Heart murmur   . Sleep apnea   . History of gout   . Psoriatic arthritis ([place])   . History of skin cancer   . Constipation   . Tinnitus     Past Surgical History  Procedure Laterality Date  . Addenoids    . Tonsillectomy    . Joint replacement  2012    RT KNEE  . Lung biopsy      20 YRS AGO  . Total knee arthroplasty Bilateral 06/22/2013    Procedure: LEFT TOTAL KNEE ARTHROPLASTY;  MEDIAL SOFT TISSUE EXPLORATION SAPHENOUS NEURECTOMY RIGHT KNEE;  Surgeon: Mauri Pole, MD;  Location: [facility];  Service: Orthopedics;  Laterality: Bilateral;    There were no vitals filed for this visit.  Visit Diagnosis:  Bilateral low back pain with sciatica, sciatica laterality unspecified  Weakness of both lower extremities  Abnormal posture  Abnormality of gait      Subjective Assessment - 04/07/15 1038    Subjective Pt reports feeling really sore after last session "but I feel like it was for the better good". Pt reports being very active this morning with running errands. No back pain.    Currently in Pain? Yes   Pain Score 3    Pain Location Knee   Pain Orientation Right;Left   Pain Descriptors / Indicators  Aching   Pain Type Chronic pain   Pain Onset More than a month ago                         [facility] Adult PT Treatment/Exercise - 04/07/15 1048    Lumbar Exercises: Supine   Other Supine Lumbar Exercises orange physioball x10 each way, 90/90 hip/knee with lumbar rotations with tran ab activation    Other Supine Lumbar Exercises peanut physioball HS curl x 20 with tran ab activation   Knee/Hip Exercises: Aerobic   Nustep L4 x 6 min   Knee/Hip Exercises: Standing   Hip Flexion Stengthening;Both;1 set;10 reps;Knee straight;Knee bent  10 reps straight, 10 bent    Hip Flexion Limitations 2#   Hip Abduction Stengthening;Both;1 set;10 reps;Knee straight   Abduction Limitations 2#   Hip Extension Stengthening;Both;1 set;10 reps;Knee straight   Knee/Hip Exercises: Seated   Long Arc Quad Strengthening;Both;1 set;Weights;15 reps   Long Arc Quad Weight 3 lbs.   Long [employer] Limitations cue to slow   Other Seated Knee/Hip Exercises fitter with 2 black 1 bluesingle leg press bil 2x10   Knee/Hip Exercises: Supine   Bridges Limitations 2x10, cues to slow and hold for 3 secs   Modalities  Modalities Microbiologist Action premod   Electrical Stimulation Parameters to tolerance   Electrical Stimulation Goals Pain                PT Education - 04/07/15 1115    Education provided Yes   Education Details electrical stimulation   Person(s) Educated Patient   Methods Explanation   Comprehension Verbalized understanding             PT Long Term Goals - 03/31/15 1547    PT LONG TERM GOAL #1   Title independent with HEP (04/26/15)   Time 6   Period Weeks   Status On-going   PT LONG TERM GOAL #2   Title report ability to stand/ambulate > 45 min without increase in pain for improved function and ability to perform job/volunteer responsibilities (04/26/15)   Time 6    Period Weeks   Status On-going   PT LONG TERM GOAL #3   Title report ability to sit > 30 min without increase in pain for improved tolerance (04/26/15)   Time 6   Period Weeks   Status On-going   PT LONG TERM GOAL #4   Title report no pain into either lower extremity for improved function (04/26/15)   Time 6   Period Weeks   Status On-going               Plan - 04/07/15 1121    Clinical Impression Statement Pt tolerated all exercise well but continues to need cueing to slow down with all exercises. Pt will benefit from continued education on posture and bending techniques in order to prevent reinjury. E-stim used at end of session to try and decrease knee pain in order to tolerate standing and dynamic exercises. Pt reported that e-stim  felt fine and that he was in no pain currently but will have to assess its effect after standing and walking on it and will report about it next session.    PT Next Visit Plan core/hip/knee strengthening; progress HEP; e-stim for the knees PRN, posture/bending education   Consulted and Agree with Plan of Care Patient        Problem List Patient Active Problem List   Diagnosis Date Noted  . Lumbar radiculopathy, acute 03/09/2015  . Post-viral cough syndrome 02/15/2015  . Essential hypertension 12/27/2014  . Type 2 diabetes mellitus without complication, without long-term current use of insulin ([place]) 12/27/2014  . Non-suppurative otitis media 06/28/2014  . Acute bronchitis 06/28/2014  . Mild aortic stenosis 06/28/2014  . Memory loss 04/29/2014  . Tinnitus of both ears 02/17/2014  . Wellness examination 12/04/2013  . Constipation due to slow transit 08/18/2013  . Morbid obesity ([place]) 06/23/2013  . S/P left TKA 06/22/2013  . Meniere disease 04/21/2013  . Benign paroxysmal positional vertigo 04/21/2013  . Psoriatic arthritis ([place]) 05/14/2012  . General medical exam 01/28/2012  . Cellulitis of eyelid 09/25/2011  . Diverticulitis 06/20/2011   . Erythrocytosis 05/18/2011  . Sinusitis, acute 03/14/2011  . S/P TKR (total knee replacement) 12/31/2010  . Obesity 12/31/2010  . Hyperlipidemia 12/22/2010  . BPH (benign prostatic hyperplasia) 12/22/2010  . Gout 12/22/2010  . DM 04/04/2009  . Obstructive sleep apnea 04/04/2009  . HYPERTENSION 04/04/2009    Ammie Ferrier, SPT 04/07/2015  11:37 AM   [facility] [facility] [address]  [place], [place], [address] Phone: [phone]  Fax:  [phone]  Name: Gary Butler MRN: [id number] Date of Birth: 1945-07-31

## 2015-04-12 ENCOUNTER — Ambulatory Visit: Payer: 59 | Admitting: Physical Therapy

## 2015-04-12 DIAGNOSIS — R269 Unspecified abnormalities of gait and mobility: Secondary | ICD-10-CM

## 2015-04-12 DIAGNOSIS — M5441 Lumbago with sciatica, right side: Secondary | ICD-10-CM

## 2015-04-12 DIAGNOSIS — M5442 Lumbago with sciatica, left side: Principal | ICD-10-CM

## 2015-04-12 DIAGNOSIS — R293 Abnormal posture: Secondary | ICD-10-CM

## 2015-04-12 DIAGNOSIS — R29898 Other symptoms and signs involving the musculoskeletal system: Secondary | ICD-10-CM

## 2015-04-12 NOTE — Therapy (Addendum)
[facility] [address]  [place], [place], [address] Phone: [phone]   Fax:  [phone]  Physical Therapy Treatment  Patient Details  Name: Gary Butler MRN: [id number] Date of Birth: [birth year] Referring Provider: Dr. Annye Asa  Encounter Date: 04/12/2015      PT End of Session - 04/12/15 1519    Visit Number 7   Number of Visits 12   Date for PT Re-Evaluation 04/26/15   PT Start Time [id number]   PT Stop Time 1535   PT Time Calculation (min) 50 min   Activity Tolerance Patient tolerated treatment well   Behavior During Therapy [facility] for tasks assessed/performed      Past Medical History  Diagnosis Date  . Diabetes mellitus   . Hypertension   . OSA (obstructive sleep apnea)   . Arthritis   . Heart murmur   . Sleep apnea   . History of gout   . Psoriatic arthritis ([place])   . History of skin cancer   . Constipation   . Tinnitus     Past Surgical History  Procedure Laterality Date  . Addenoids    . Tonsillectomy    . Joint replacement  2012    RT KNEE  . Lung biopsy      20 YRS AGO  . Total knee arthroplasty Bilateral 06/22/2013    Procedure: LEFT TOTAL KNEE ARTHROPLASTY;  MEDIAL SOFT TISSUE EXPLORATION SAPHENOUS NEURECTOMY RIGHT KNEE;  Surgeon: Mauri Pole, MD;  Location: [facility];  Service: Orthopedics;  Laterality: Bilateral;    There were no vitals filed for this visit.  Visit Diagnosis:  Bilateral low back pain with sciatica, sciatica laterality unspecified  Weakness of both lower extremities  Abnormal posture  Abnormality of gait      Subjective Assessment - 04/12/15 1447    Subjective Had a lot of back pain yesterday but went and laid down on bed and did some exercises; no pain or problems today.   How long can you sit comfortably? 2 hours in supportive chair   How long can you stand comfortably? 7-10 min (previously 20 min)   How long can you walk comfortably? 10 min (previously  30 min)   Patient Stated Goals avoid repeat of pain that sent him to ED   Currently in Pain? No/denies                         [facility] Adult PT Treatment/Exercise - 04/12/15 1449    Lumbar Exercises: Standing   Other Standing Lumbar Exercises lumbar extension over counter x 20   Lumbar Exercises: Seated   Long Arc Quad on [place] Both;1 set;10 reps;Weights   LAQ on [employer] (lbs) 2   LAQ on [place] Limitations on sit fit   Hip Flexion on [employer] reps   Hip Flexion on Ball Limitations 2#; on sit fit   Lumbar Exercises: Supine   Bridge 5 seconds;5 reps   Bridge Limitations on orange physioball; stopped due to increased pain; performed x 10 without ball and decreased pain   Other Supine Lumbar Exercises orange physioball x10 each way, 90/90 hip/knee with lumbar rotations with tran ab activation    Other Supine Lumbar Exercises seated pelvic rocking ant/post and laterally on sit fit x 20 bil   Knee/Hip Exercises: Aerobic   Nustep L6 x 6 min   Knee/Hip Exercises: Standing   Hip Abduction Stengthening;Both;2 sets;10 reps;Knee straight  Hip Extension Stengthening;Both;10 reps;Knee straight;2 sets   Modalities   Modalities Retail buyer Location bil knees   Electrical Stimulation Action premod x 15 min   Electrical Stimulation Parameters to tolerance   Electrical Stimulation Goals Pain                     PT Long Term Goals - 03/31/15 1547    PT LONG TERM GOAL #1   Title independent with HEP (04/26/15)   Time 6   Period Weeks   Status On-going   PT LONG TERM GOAL #2   Title report ability to stand/ambulate > 45 min without increase in pain for improved function and ability to perform job/volunteer responsibilities (04/26/15)   Time 6   Period Weeks   Status On-going   PT LONG TERM GOAL #3   Title report ability to sit > 30 min without increase in pain for improved tolerance (04/26/15)   Time  6   Period Weeks   Status On-going   PT LONG TERM GOAL #4   Title report no pain into either lower extremity for improved function (04/26/15)   Time 6   Period Weeks   Status On-going               Plan - 04/12/15 1519    Clinical Impression Statement Pt planning to go on cruise in next 1-2 weeks therefore requesting d/c at next visit.  Pt also feels comfortable with continuing HEP and able to manage pain and symptoms should pain return.   PT Next Visit Plan check goals, d/c PT next visit   Consulted and Agree with Plan of Care Patient        Problem List Patient Active Problem List   Diagnosis Date Noted  . Lumbar radiculopathy, acute 03/09/2015  . Post-viral cough syndrome 02/15/2015  . Essential hypertension 12/27/2014  . Type 2 diabetes mellitus without complication, without long-term current use of insulin ([place]) 12/27/2014  . Non-suppurative otitis media 06/28/2014  . Acute bronchitis 06/28/2014  . Mild aortic stenosis 06/28/2014  . Memory loss 04/29/2014  . Tinnitus of both ears 02/17/2014  . Wellness examination 12/04/2013  . Constipation due to slow transit 08/18/2013  . Morbid obesity ([place]) 06/23/2013  . S/P left TKA 06/22/2013  . Meniere disease 04/21/2013  . Benign paroxysmal positional vertigo 04/21/2013  . Psoriatic arthritis ([place]) 05/14/2012  . General medical exam 01/28/2012  . Cellulitis of eyelid 09/25/2011  . Diverticulitis 06/20/2011  . Erythrocytosis 05/18/2011  . Sinusitis, acute 03/14/2011  . S/P TKR (total knee replacement) 12/31/2010  . Obesity 12/31/2010  . Hyperlipidemia 12/22/2010  . BPH (benign prostatic hyperplasia) 12/22/2010  . Gout 12/22/2010  . DM 04/04/2009  . Obstructive sleep apnea 04/04/2009  . HYPERTENSION 04/04/2009   Laureen Abrahams, PT, DPT 04/12/2015 3:36 PM  [facility] [address]  [place], [place], [address] Phone: [phone]   Fax:   [phone]  Name: Gary Butler MRN: [id number] Date of Birth: 06-10-1945

## 2015-04-14 ENCOUNTER — Ambulatory Visit: Payer: 59 | Attending: Family Medicine | Admitting: Physical Therapy

## 2015-04-14 ENCOUNTER — Other Ambulatory Visit: Payer: Self-pay | Admitting: Family Medicine

## 2015-04-14 DIAGNOSIS — M5442 Lumbago with sciatica, left side: Secondary | ICD-10-CM | POA: Insufficient documentation

## 2015-04-14 DIAGNOSIS — R269 Unspecified abnormalities of gait and mobility: Secondary | ICD-10-CM

## 2015-04-14 DIAGNOSIS — R293 Abnormal posture: Secondary | ICD-10-CM | POA: Diagnosis present

## 2015-04-14 DIAGNOSIS — R29898 Other symptoms and signs involving the musculoskeletal system: Secondary | ICD-10-CM | POA: Diagnosis present

## 2015-04-14 DIAGNOSIS — M5441 Lumbago with sciatica, right side: Secondary | ICD-10-CM | POA: Insufficient documentation

## 2015-04-14 NOTE — Therapy (Signed)
[facility] [address]  [place], [place], [address] Phone: [phone]   Fax:  [phone]  Physical Therapy Treatment  Patient Details  Name: Gary Butler MRN: [id number] Date of Birth: [birth year] Referring Provider: Dr. Annye Asa  Encounter Date: 04/14/2015      PT End of Session - 04/14/15 1117    Visit Number 8   Number of Visits 12   Date for PT Re-Evaluation 04/26/15   PT Start Time 1025   PT Stop Time 1105   PT Time Calculation (min) 40 min   Activity Tolerance Patient tolerated treatment well   Behavior During Therapy [facility] for tasks assessed/performed      Past Medical History  Diagnosis Date  . Diabetes mellitus   . Hypertension   . OSA (obstructive sleep apnea)   . Arthritis   . Heart murmur   . Sleep apnea   . History of gout   . Psoriatic arthritis ([place])   . History of skin cancer   . Constipation   . Tinnitus     Past Surgical History  Procedure Laterality Date  . Addenoids    . Tonsillectomy    . Joint replacement  2012    RT KNEE  . Lung biopsy      20 YRS AGO  . Total knee arthroplasty Bilateral 06/22/2013    Procedure: LEFT TOTAL KNEE ARTHROPLASTY;  MEDIAL SOFT TISSUE EXPLORATION SAPHENOUS NEURECTOMY RIGHT KNEE;  Surgeon: Mauri Pole, MD;  Location: [facility];  Service: Orthopedics;  Laterality: Bilateral;    There were no vitals filed for this visit.  Visit Diagnosis:  Bilateral low back pain with sciatica, sciatica laterality unspecified  Weakness of both lower extremities  Abnormal posture  Abnormality of gait      Subjective Assessment - 04/14/15 1026    Subjective Doing well; no complaints today.  Wants to d/c today; doing well and going on 2 week cruise next week.   How long can you sit comfortably? 2 hours in supportive chair   How long can you stand comfortably? 20-30 min   How long can you walk comfortably? 20 min   Patient Stated Goals avoid repeat of  pain that sent him to ED   Currently in Pain? Yes  knees only   Pain Score 5    Pain Location Knee   Pain Orientation Right;Left   Pain Descriptors / Indicators Aching   Pain Type Chronic pain   Pain Onset More than a month ago            [facility] PT Assessment - 04/14/15 1105    Observation/Other Assessments   Focus on Therapeutic Outcomes (FOTO)  54 (46% limited)                     [facility] Adult PT Treatment/Exercise - 04/14/15 1030    Self-Care   Self-Care ADL's;Lifting;Posture   ADL's reviewed safe techniques to decrease risk of reinjury   Lifting reviewed "golfer's lift", squatting and use of reacher to decrease risk of injury   Posture educated proper sitting posture; use of breaks at computer   Lumbar Exercises: Stretches   Passive Hamstring Stretch 30 seconds;2 reps   Passive Hamstring Stretch Limitations seated with towel for gastroc stretch   Piriformis Stretch 2 reps;30 seconds   Lumbar Exercises: Standing   Other Standing Lumbar Exercises lumbar extension over counter x 20   Lumbar Exercises: Supine   Ab  Set 10 reps;5 seconds   Bridge 5 seconds;10 reps   Knee/Hip Exercises: Aerobic   Nustep L6 x 6 min   Knee/Hip Exercises: Standing   Hip Abduction Stengthening;Both;10 reps;Knee straight   Hip Extension Stengthening;Both;10 reps;Knee straight;2 sets                PT Education - 2015-05-11 1117    Education provided Yes   Education Details posture/body International aid/development worker) Educated Patient   Methods Explanation;Handout   Comprehension Verbalized understanding             PT Long Term Goals - 11-May-2015 1031    PT LONG TERM GOAL #1   Title independent with HEP (04/26/15)   Status Achieved   PT LONG TERM GOAL #2   Title report ability to stand/ambulate > 45 min without increase in pain for improved function and ability to perform job/volunteer responsibilities (04/26/15)   Status Not Met   PT LONG TERM GOAL #3   Title report  ability to sit > 30 min without increase in pain for improved tolerance (04/26/15)   Status Achieved   PT LONG TERM GOAL #4   Title report no pain into either lower extremity for improved function (04/26/15)   Baseline c/o OA pain in bil knees unrelated to LBP   Status Achieved               Plan - 05-11-15 1118    Clinical Impression Statement Pt has met 3 of 4 LTGs.  Has not met standing tolerance goal but at this time pt is pleased with current level of function and verbalizes understanding to decrease risk of reinjury.  Pt requesting d/c as he is going on a cruise next week for 2 weeks.   PT Next Visit Plan d/c PT           G-Codes - 05/11/15 1120    Functional Assessment Tool Used FOTO 54 (46% limited)   Functional Limitation Mobility: Walking and moving around   Mobility: Walking and Moving Around Goal Status [id number]) At least 40 percent but less than 60 percent impaired, limited or restricted   Mobility: Walking and Moving Around Discharge Status [phone]) At least 40 percent but less than 60 percent impaired, limited or restricted      Problem List Patient Active Problem List   Diagnosis Date Noted  . Lumbar radiculopathy, acute 03/09/2015  . Post-viral cough syndrome 02/15/2015  . Essential hypertension 12/27/2014  . Type 2 diabetes mellitus without complication, without long-term current use of insulin ([place]) 12/27/2014  . Non-suppurative otitis media 06/28/2014  . Acute bronchitis 06/28/2014  . Mild aortic stenosis 06/28/2014  . Memory loss 04/29/2014  . Tinnitus of both ears 02/17/2014  . Wellness examination 12/04/2013  . Constipation due to slow transit 08/18/2013  . Morbid obesity ([place]) 06/23/2013  . S/P left TKA 06/22/2013  . Meniere disease 04/21/2013  . Benign paroxysmal positional vertigo 04/21/2013  . Psoriatic arthritis ([place]) 05/14/2012  . General medical exam 01/28/2012  . Cellulitis of eyelid 09/25/2011  . Diverticulitis 06/20/2011  .  Erythrocytosis 05/18/2011  . Sinusitis, acute 03/14/2011  . S/P TKR (total knee replacement) 12/31/2010  . Obesity 12/31/2010  . Hyperlipidemia 12/22/2010  . BPH (benign prostatic hyperplasia) 12/22/2010  . Gout 12/22/2010  . DM 04/04/2009  . Obstructive sleep apnea 04/04/2009  . HYPERTENSION 04/04/2009   Laureen Abrahams, PT, DPT 2015/05/11 11:21 AM  [facility] [address]  [place], [place], [address] Phone: [phone]   Fax:  [phone]  Name: Ebert Forrester MRN: [id number] Date of Birth: 1945/06/01     PHYSICAL THERAPY DISCHARGE SUMMARY  Visits from Start of Care: 8  Current functional level related to goals / functional outcomes: See above   Remaining deficits: Pt still reports limited standing tolerance; but at this time not affecting function   Education / Equipment: HEP, posture/body mechanics  Plan: Patient agrees to discharge.  Patient goals were partially met. Patient is being discharged due to being pleased with the current functional level.  ?????    Laureen Abrahams, PT, DPT 04/14/2015 11:22 AM  [facility] [place], [place] [address]  [phone] (office) [phone] (fax)

## 2015-04-14 NOTE — Patient Instructions (Addendum)

## 2015-04-14 NOTE — Telephone Encounter (Signed)
Medication filled to pharmacy as requested.   

## 2015-05-04 LAB — HM DIABETES EYE EXAM

## 2015-05-18 ENCOUNTER — Encounter: Payer: Self-pay | Admitting: General Practice

## 2015-06-13 ENCOUNTER — Ambulatory Visit: Payer: Medicare Other | Admitting: Neurology

## 2015-06-20 ENCOUNTER — Ambulatory Visit (HOSPITAL_BASED_OUTPATIENT_CLINIC_OR_DEPARTMENT_OTHER)
Admission: RE | Admit: 2015-06-20 | Discharge: 2015-06-20 | Disposition: A | Discharge status: Home / Self Care | Payer: Medicare Other | Source: Ambulatory Visit | Attending: Family Medicine | Admitting: Family Medicine

## 2015-06-20 ENCOUNTER — Encounter: Payer: Self-pay | Admitting: Family Medicine

## 2015-06-20 ENCOUNTER — Telehealth: Payer: Self-pay | Admitting: Family Medicine

## 2015-06-20 ENCOUNTER — Ambulatory Visit (INDEPENDENT_AMBULATORY_CARE_PROVIDER_SITE_OTHER): Payer: Medicare Other | Admitting: Family Medicine

## 2015-06-20 ENCOUNTER — Other Ambulatory Visit: Payer: Self-pay | Admitting: Family Medicine

## 2015-06-20 VITALS — BP 134/88 | HR 65 | Temp 98.1°F | Resp 17 | Wt 359.4 lb

## 2015-06-20 DIAGNOSIS — J90 Pleural effusion, not elsewhere classified: Secondary | ICD-10-CM | POA: Diagnosis not present

## 2015-06-20 DIAGNOSIS — R600 Localized edema: Secondary | ICD-10-CM | POA: Diagnosis not present

## 2015-06-20 DIAGNOSIS — I509 Heart failure, unspecified: Secondary | ICD-10-CM | POA: Diagnosis not present

## 2015-06-20 DIAGNOSIS — R609 Edema, unspecified: Secondary | ICD-10-CM | POA: Insufficient documentation

## 2015-06-20 DIAGNOSIS — R0602 Shortness of breath: Secondary | ICD-10-CM

## 2015-06-20 LAB — CBC WITH DIFFERENTIAL/PLATELET
Basophils Absolute: 0 10*3/uL (ref 0.0–0.1)
Basophils Relative: 0.3 % (ref 0.0–3.0)
Eosinophils Absolute: 0.3 10*3/uL (ref 0.0–0.7)
Eosinophils Relative: 5.2 % — ABNORMAL HIGH (ref 0.0–5.0)
HCT: 41.4 % (ref 39.0–52.0)
Hemoglobin: 14 g/dL (ref 13.0–17.0)
Lymphocytes Relative: 10.8 % — ABNORMAL LOW (ref 12.0–46.0)
Lymphs Abs: 0.7 10*3/uL (ref 0.7–4.0)
MCHC: 33.7 g/dL (ref 30.0–36.0)
MCV: 95.3 fl (ref 78.0–100.0)
Monocytes Absolute: 0.3 10*3/uL (ref 0.1–1.0)
Monocytes Relative: 5 % (ref 3.0–12.0)
Neutro Abs: 5.1 10*3/uL (ref 1.4–7.7)
Neutrophils Relative %: 78.7 % — ABNORMAL HIGH (ref 43.0–77.0)
Platelets: 209 10*3/uL (ref 150.0–400.0)
RBC: 4.35 Mil/uL (ref 4.22–5.81)
RDW: 16.3 % — ABNORMAL HIGH (ref 11.5–15.5)
WBC: 6.5 10*3/uL (ref 4.0–10.5)

## 2015-06-20 LAB — HEPATIC FUNCTION PANEL
ALT: 17 U/L (ref 0–53)
AST: 19 U/L (ref 0–37)
Albumin: 3.4 g/dL — ABNORMAL LOW (ref 3.5–5.2)
Alkaline Phosphatase: 81 U/L (ref 39–117)
Bilirubin, Direct: 0.3 mg/dL (ref 0.0–0.3)
Total Bilirubin: 1.2 mg/dL (ref 0.2–1.2)
Total Protein: 5.7 g/dL — ABNORMAL LOW (ref 6.0–8.3)

## 2015-06-20 LAB — BASIC METABOLIC PANEL
BUN: 13 mg/dL (ref 6–23)
CO2: 27 mEq/L (ref 19–32)
Calcium: 9 mg/dL (ref 8.4–10.5)
Chloride: 103 mEq/L (ref 96–112)
Creatinine, Ser: 0.87 mg/dL (ref 0.40–1.50)
GFR: 92.05 mL/min (ref >60.00)
Glucose, Bld: 127 mg/dL — ABNORMAL HIGH (ref 70–99)
Potassium: 3.8 mEq/L (ref 3.5–5.1)
Sodium: 137 mEq/L (ref 135–145)

## 2015-06-20 LAB — BRAIN NATRIURETIC PEPTIDE: Pro B Natriuretic peptide (BNP): 141 pg/mL — ABNORMAL HIGH (ref 0.0–100.0)

## 2015-06-20 LAB — TSH: TSH: 2.9 u[IU]/mL (ref 0.35–4.50)

## 2015-06-20 MED ORDER — FUROSEMIDE 20 MG PO TABS: 20.0000 mg | ORAL_TABLET | Freq: Every day | ORAL | Status: DC

## 2015-06-20 NOTE — Telephone Encounter (Signed)
Patient Name: Gary Butler  DOB: 18-Jan-1946    Initial Comment Caller states SOB, leg swelling.   Nurse Assessment  Nurse: Leilani Merl, RN, Heather Date/Time (Eastern Time): 06/20/2015 9:18:47 AM  Confirm and document reason for call. If symptomatic, describe symptoms. You must click the next button to save text entered. ---caller states that he has both legs with swelling that started about 3 days ago with some SOB that started about a week ago.  Has the patient traveled out of the country within the last 30 days? ---Not Applicable  Does the patient have any new or worsening symptoms? ---Yes  Will a triage be completed? ---Yes  Related visit to physician within the last 2 weeks? ---No  Does the PT have any chronic conditions? (i.e. diabetes, asthma, etc.) ---Yes  List chronic conditions. ---See MR  Is this a behavioral health or substance abuse call? ---No     Guidelines    Guideline Title Affirmed Question Affirmed Notes  Leg Swelling and Edema [1] Difficulty breathing with exertion (e.g., walking) AND [2] new onset or worsening    Final Disposition User   Go to ED Now (or PCP triage) Leilani Merl, RN, Heather    Comments  Appt made with Dr. Birdie Riddle at 10:30 am this morning.   Referrals  REFERRED TO PCP OFFICE   Disagree/Comply: Comply

## 2015-06-20 NOTE — Telephone Encounter (Signed)
Pt scheduled with PCP today

## 2015-06-20 NOTE — Patient Instructions (Signed)
Follow up in 1 week to recheck swelling and shortness of breath Get your xray done at the [facility] notify you of your lab results and make any changes if needed Try and increase your water intake and limit your salt intake Elevate your legs when you are able If your shortness of breath changes or worsens, please let me know or go to the ER Call with any questions or concerns Hang in there!!!

## 2015-06-20 NOTE — Progress Notes (Signed)
Pre visit review using our clinic review tool, if applicable. No additional management support is needed unless otherwise documented below in the visit note. 

## 2015-06-20 NOTE — Progress Notes (Signed)
   Subjective:    Patient ID: Gary Butler, male    DOB: 01/18/1946, 70 y.o.   MRN: [id number]  HPI Leg swelling/SOB- SOB started ~5 days ago.  Pt has audible wheezing.  SOB occurs w/ minimal exertion.  + cough- productive in the AM.  No fevers.  Wearing CPAP nightly.  Leg swelling started 2 days ago.  Pt reports he is eating out more but no 'radical changes'.  Dr Amil Amen increased methotrexate to 10mg  weekly at visit 2 weeks ago.  Legs are not painful.  Swelling is bilateral.   Review of Systems For ROS see HPI     Objective:   Physical Exam  Constitutional: He is oriented to person, place, and time. He appears well-developed and well-nourished. No distress.  Morbidly obese  HENT:  Head: Normocephalic and atraumatic.  Eyes: Conjunctivae and EOM are normal. Pupils are equal, round, and reactive to light.  Neck: Normal range of motion. Neck supple. No thyromegaly present.  Cardiovascular: Normal rate, regular rhythm, normal heart sounds and intact distal pulses.   Pulmonary/Chest: Effort normal. No respiratory distress. He has no wheezes. He has no rales.  Decreased BS over bases bilaterally- no wheezes or crackles  Musculoskeletal: He exhibits edema (2+ LE edema bilaterally) and tenderness (mild TTP over bilateral LEs).  Lymphadenopathy:    He has no cervical adenopathy.  Neurological: He is alert and oriented to person, place, and time.  Skin: Skin is warm and dry. No erythema.  Psychiatric: He has a normal mood and affect. His behavior is normal. Thought content normal.  Vitals reviewed.         Assessment & Plan:

## 2015-06-27 ENCOUNTER — Encounter: Payer: Self-pay | Admitting: Family Medicine

## 2015-06-27 ENCOUNTER — Ambulatory Visit (INDEPENDENT_AMBULATORY_CARE_PROVIDER_SITE_OTHER): Payer: Medicare Other | Admitting: Family Medicine

## 2015-06-27 ENCOUNTER — Ambulatory Visit: Payer: Medicare Other | Admitting: Neurology

## 2015-06-27 VITALS — BP 128/86 | HR 84 | Temp 98.0°F | Resp 16 | Ht 71.0 in | Wt 340.2 lb

## 2015-06-27 DIAGNOSIS — I10 Essential (primary) hypertension: Secondary | ICD-10-CM

## 2015-06-27 DIAGNOSIS — B369 Superficial mycosis, unspecified: Secondary | ICD-10-CM

## 2015-06-27 DIAGNOSIS — E785 Hyperlipidemia, unspecified: Secondary | ICD-10-CM | POA: Diagnosis not present

## 2015-06-27 DIAGNOSIS — E119 Type 2 diabetes mellitus without complications: Secondary | ICD-10-CM

## 2015-06-27 DIAGNOSIS — I509 Heart failure, unspecified: Secondary | ICD-10-CM

## 2015-06-27 LAB — CBC WITH DIFFERENTIAL/PLATELET
Basophils Absolute: 0.1 10*3/uL (ref 0.0–0.1)
Basophils Relative: 0.6 % (ref 0.0–3.0)
Eosinophils Absolute: 0.3 10*3/uL (ref 0.0–0.7)
Eosinophils Relative: 3.7 % (ref 0.0–5.0)
HCT: 45.5 % (ref 39.0–52.0)
Hemoglobin: 15.2 g/dL (ref 13.0–17.0)
Lymphocytes Relative: 12.6 % (ref 12.0–46.0)
Lymphs Abs: 1.1 10*3/uL (ref 0.7–4.0)
MCHC: 33.5 g/dL (ref 30.0–36.0)
MCV: 95.8 fl (ref 78.0–100.0)
Monocytes Absolute: 0.5 10*3/uL (ref 0.1–1.0)
Monocytes Relative: 6.4 % (ref 3.0–12.0)
Neutro Abs: 6.5 10*3/uL (ref 1.4–7.7)
Neutrophils Relative %: 76.7 % (ref 43.0–77.0)
Platelets: 275 10*3/uL (ref 150.0–400.0)
RBC: 4.76 Mil/uL (ref 4.22–5.81)
RDW: 16.1 % — ABNORMAL HIGH (ref 11.5–15.5)
WBC: 8.4 10*3/uL (ref 4.0–10.5)

## 2015-06-27 LAB — HEPATIC FUNCTION PANEL
ALT: 33 U/L (ref 0–53)
AST: 24 U/L (ref 0–37)
Albumin: 4 g/dL (ref 3.5–5.2)
Alkaline Phosphatase: 106 U/L (ref 39–117)
Bilirubin, Direct: 0.3 mg/dL (ref 0.0–0.3)
Total Bilirubin: 1.1 mg/dL (ref 0.2–1.2)
Total Protein: 6.5 g/dL (ref 6.0–8.3)

## 2015-06-27 LAB — LIPID PANEL
Cholesterol: 97 mg/dL (ref 0–200)
HDL: 36.6 mg/dL — ABNORMAL LOW (ref >39.00)
LDL Cholesterol: 42 mg/dL (ref 0–99)
NonHDL: 59.96
Total CHOL/HDL Ratio: 3
Triglycerides: 88 mg/dL (ref 0.0–149.0)
VLDL: 17.6 mg/dL (ref 0.0–40.0)

## 2015-06-27 LAB — BASIC METABOLIC PANEL
BUN: 14 mg/dL (ref 6–23)
CO2: 30 mEq/L (ref 19–32)
Calcium: 9.7 mg/dL (ref 8.4–10.5)
Chloride: 99 mEq/L (ref 96–112)
Creatinine, Ser: 0.86 mg/dL (ref 0.40–1.50)
GFR: 93.28 mL/min (ref >60.00)
Glucose, Bld: 169 mg/dL — ABNORMAL HIGH (ref 70–99)
Potassium: 3.2 mEq/L — ABNORMAL LOW (ref 3.5–5.1)
Sodium: 136 mEq/L (ref 135–145)

## 2015-06-27 LAB — TSH: TSH: 2.96 u[IU]/mL (ref 0.35–4.50)

## 2015-06-27 LAB — HEMOGLOBIN A1C: Hgb A1c MFr Bld: 6.7 % — ABNORMAL HIGH (ref 4.6–6.5)

## 2015-06-27 MED ORDER — NYSTATIN 100000 UNIT/GM EX OINT: 1.0000 "application " | TOPICAL_OINTMENT | Freq: Two times a day (BID) | CUTANEOUS | Status: DC

## 2015-06-27 NOTE — Assessment & Plan Note (Signed)
New.  Severe under pannus and groin creases bilaterally.  Start topical Nystatin.  Discussed w/ pt that if not better in the next 5-7 days to let me know so we can add oral Diflucan.  Pt expressed understanding and is in agreement w/ plan.

## 2015-06-27 NOTE — Assessment & Plan Note (Signed)
Chronic problem.  Well controlled.  Asymptomatic.  Check labs.  No anticipated med changes. 

## 2015-06-27 NOTE — Progress Notes (Signed)
   Subjective:    Patient ID: Gary Butler, male    DOB: Mar 28, 1945, 70 y.o.   MRN: [id number]  HPI HTN- chronic problem.  Adequate control on ACE, atenolol, Lasiz, Maxzide.  No CP, SOB, HAs, visual changes.  Hyperlipidemia- chronic problem, on Lipitor.  Due for labs.  Denies abd pain, N/V.  DM- chronic problem.  Pt indicates that Endo has released him and he is overdue for A1C.  On Actos.  UTD on eye exam.  On ACE for renal protection.  No numbness or tingling of hands/feet.  CHF- new at last visit.  Pt was started on Lasix and has lost 19 lbs since 5/8.  On beta blocker and ACE.  Has appt w/ Cards 5/22.  No longer having ankle swelling or SOB.    Skin breakdown- sxs started ~1 week ago under pannus.  Has been using Neosporin.  Very raw but denies infxn.     Review of Systems For ROS see HPI     Objective:   Physical Exam  Constitutional: He is oriented to person, place, and time. He appears well-developed and well-nourished. No distress.  HENT:  Head: Normocephalic and atraumatic.  Eyes: Conjunctivae and EOM are normal. Pupils are equal, round, and reactive to light.  Neck: Normal range of motion. Neck supple. No thyromegaly present.  Cardiovascular: Normal rate, regular rhythm, normal heart sounds and intact distal pulses.   No murmur heard. Pulmonary/Chest: Effort normal and breath sounds normal. No respiratory distress.  Abdominal: Soft. Bowel sounds are normal. He exhibits no distension.  Musculoskeletal: He exhibits no edema.  Lymphadenopathy:    He has no cervical adenopathy.  Neurological: He is alert and oriented to person, place, and time. No cranial nerve deficit.  Skin: Skin is warm and dry. Rash (fungal dermatitis under pannus and of groin bilaterally) noted. There is erythema.  Psychiatric: He has a normal mood and affect. His behavior is normal.  Vitals reviewed.         Assessment & Plan:

## 2015-06-27 NOTE — Progress Notes (Signed)
Pre visit review using our clinic review tool, if applicable. No additional management support is needed unless otherwise documented below in the visit note. 

## 2015-06-27 NOTE — Patient Instructions (Signed)
Follow up in 3-4 months to recheck diabetes We'll notify you of your lab results and make any changes if needed Continue to work on healthy diet and regular exercise- you can do it! Apply the Nystatin twice daily.  If no improvement after 5-7 days, let me know so we can add oral medication Call with any questions or concerns Hang in there!!!

## 2015-06-27 NOTE — Assessment & Plan Note (Signed)
Chronic problem.  Pt has been released from Endo due to well controlled sugars.  Due for A1C.  UTD on eye exam.  Foot exam done today.  On ACE for renal protection.  Check labs.  Adjust meds prn

## 2015-06-27 NOTE — Assessment & Plan Note (Signed)
Chronic problem.  Tolerating statin w/o difficulty.  Check labs.  Adjust meds prn  

## 2015-06-27 NOTE — Assessment & Plan Note (Signed)
New.  Pt has lost 19 lbs since 5/8 when he started Lasix.  No longer having leg swelling or SOB.  Has appt upcoming w/ Cards.  On beta blocker, ACE, lasix.  Will follow along and assist as able.

## 2015-06-28 ENCOUNTER — Other Ambulatory Visit: Payer: Self-pay | Admitting: General Practice

## 2015-06-28 DIAGNOSIS — E876 Hypokalemia: Secondary | ICD-10-CM

## 2015-06-28 MED ORDER — POTASSIUM CHLORIDE CRYS ER 20 MEQ PO TBCR: 20.0000 meq | EXTENDED_RELEASE_TABLET | Freq: Every day | ORAL | Status: DC

## 2015-06-29 NOTE — Assessment & Plan Note (Signed)
New.  Pt's SOB on exertion coupled w/ his edema and reported wheezing is concerning for CHF despite normal ECHO May 2016.  Get CXR.  Labs to assess volume status.  Start Lasix.  Refer to cards for complete evaluation and tx.  Pt expressed understanding and is in agreement w/ plan.

## 2015-06-29 NOTE — Assessment & Plan Note (Signed)
New.  Pt's edema and SOB are concerning for CHF (see above)

## 2015-07-01 ENCOUNTER — Other Ambulatory Visit: Payer: Self-pay | Admitting: Family Medicine

## 2015-07-04 ENCOUNTER — Encounter: Payer: Self-pay | Admitting: Cardiovascular Disease

## 2015-07-04 ENCOUNTER — Ambulatory Visit (INDEPENDENT_AMBULATORY_CARE_PROVIDER_SITE_OTHER): Payer: Medicare Other | Admitting: Cardiovascular Disease

## 2015-07-04 VITALS — BP 102/62 | HR 62 | Ht 71.5 in | Wt 335.2 lb

## 2015-07-04 DIAGNOSIS — G4733 Obstructive sleep apnea (adult) (pediatric): Secondary | ICD-10-CM

## 2015-07-04 DIAGNOSIS — R011 Cardiac murmur, unspecified: Secondary | ICD-10-CM

## 2015-07-04 DIAGNOSIS — I350 Nonrheumatic aortic (valve) stenosis: Secondary | ICD-10-CM

## 2015-07-04 DIAGNOSIS — Z794 Long term (current) use of insulin: Secondary | ICD-10-CM

## 2015-07-04 DIAGNOSIS — E119 Type 2 diabetes mellitus without complications: Secondary | ICD-10-CM

## 2015-07-04 DIAGNOSIS — Z79899 Other long term (current) drug therapy: Secondary | ICD-10-CM

## 2015-07-04 DIAGNOSIS — I10 Essential (primary) hypertension: Secondary | ICD-10-CM | POA: Diagnosis not present

## 2015-07-04 DIAGNOSIS — I509 Heart failure, unspecified: Secondary | ICD-10-CM

## 2015-07-04 DIAGNOSIS — R600 Localized edema: Secondary | ICD-10-CM

## 2015-07-04 MED ORDER — FUROSEMIDE 40 MG PO TABS: 40.0000 mg | ORAL_TABLET | Freq: Every day | ORAL | Status: DC

## 2015-07-04 MED ORDER — MELOXICAM 15 MG PO TABS: 15.0000 mg | ORAL_TABLET | ORAL | Status: DC | PRN

## 2015-07-04 NOTE — Patient Instructions (Signed)
Your physician recommends that you return for lab work.  Your physician has requested that you have an echocardiogram. Echocardiography is a painless test that uses sound waves to create images of your heart. It provides your doctor with information about the size and shape of your heart and how well your heart's chambers and valves are working. This procedure takes approximately one hour. There are no restrictions for this procedure.  Your physician has recommended you make the following change in your medication:   1.) DR Claiborne Billings RECOMMENDS THAT YOU USE THE MOBIC ONLY AS NEEDED.  2.) the furosemide has been increased to 40 mg daily.  STOP the [place] .  Your physician recommends that you schedule a follow-up appointment in: 4--6 weeks.

## 2015-07-04 NOTE — Telephone Encounter (Signed)
Medication filled to pharmacy as requested.   

## 2015-07-05 ENCOUNTER — Other Ambulatory Visit: Payer: Self-pay | Admitting: *Deleted

## 2015-07-05 ENCOUNTER — Telehealth: Payer: Self-pay | Admitting: Family Medicine

## 2015-07-05 DIAGNOSIS — I509 Heart failure, unspecified: Secondary | ICD-10-CM

## 2015-07-05 NOTE — Telephone Encounter (Signed)
i'm ok with the recommendations made by Dr Claiborne Billings.  Mobic only as needed rather than daily for pain/inflammation and stopping the Maxzide

## 2015-07-05 NOTE — Telephone Encounter (Signed)
Pt states that he went to his cardo yesterday and that Dr wants to change his diabetes meds, pt feels unsure about this and asking for a call back.

## 2015-07-05 NOTE — Telephone Encounter (Signed)
Called and spoke with patient he advised that he is concerned with changing his meds. The changes are: using the meloxicam for pain (pt did not know he was taking this), stop the maxide and increase the lasix from 10mg  to 40mg . Patient would like to make sure you are ok with these changes before he takes the new meds.

## 2015-07-05 NOTE — Telephone Encounter (Signed)
Patient notified of PCP recommendations and is agreement and expresses an understanding.  

## 2015-07-06 ENCOUNTER — Encounter: Payer: Self-pay | Admitting: Cardiovascular Disease

## 2015-07-06 DIAGNOSIS — R600 Localized edema: Secondary | ICD-10-CM | POA: Insufficient documentation

## 2015-07-06 NOTE — Progress Notes (Signed)
Patient ID: Gary Butler, male   DOB: [birth year], 70 y.o.   MRN: [id number]     Primary MD: Dr. Denton Meek.   PATIENT PROFILE: Gary Butler is a 70 y.o. male who is referred through the courtesy of Dr. Gareth Eagle.  He for evaluation of leg swelling and evaluation of his heart murmur.   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  He has a long-standing history of obstructive sleep apnea and utilizes CPAP.  Remotely he had seen Dr. Mirian Capuchin and had not 15 cm pressure.  In 2012.  He had undergone a myocardial perfusion study prior to right knee replacement surgery.  This revealed normal perfusion with mild thinning in the inferior wall felt to be due to diaphragmatic attenuation.  He underwent venous Dopplers studies at that time for persistent leg edema and there was no evidence for DVT and he was noted to have reflux in the left great saphenous vein in the upper calf only.  An echo Doppler study in May 2016 revealed an EF of 50-55%.  He had a thickened mildly calcified aortic valve with mild aortic stenosis.  His mean gradient was 12 and peak gradient 21 mm.  There was mild left atrial dilatation.  Recently, he has had one-month history of increasing leg swelling associated with shortness of breath.  The patient was put on Lasix and lost 19 pounds.  He denies any episodes of chest pain or palpitations.  He admits to using his CPAP with 100% compliance.  He denies daytime sleepiness.  He denies presyncope or syncope.  He is referred for cardiology evaluation.  History is also notable for gout for which he takes allopurinol.  He also has been taken meloxicam.  He has been on atenolol 50 mg daily in the morning, Remeron, Toprol 5 mg and Maxide one half pill daily and recently Lasix 20 mg was instituted.  He has a history of psoriatic arthritis for which he takes methotrexate.  He has a history of hyperlipidemia on Lipitor.  He takes Llinzess for periodic  constipation.  He presents for evaluation.  Past Medical History  Diagnosis Date  . Diabetes mellitus   . Hypertension   . OSA (obstructive sleep apnea)   . Arthritis   . Heart murmur   . Sleep apnea   . History of gout   . Psoriatic arthritis ([place])   . History of skin cancer   . Constipation   . Tinnitus     Past Surgical History  Procedure Laterality Date  . Addenoids    . Tonsillectomy    . Joint replacement  2012    RT KNEE  . Lung biopsy      20 YRS AGO  . Total knee arthroplasty Bilateral 06/22/2013    Procedure: LEFT TOTAL KNEE ARTHROPLASTY;  MEDIAL SOFT TISSUE EXPLORATION SAPHENOUS NEURECTOMY RIGHT KNEE;  Surgeon: Mauri Pole, MD;  Location: [facility];  Service: Orthopedics;  Laterality: Bilateral;    Allergies  Allergen Reactions  . Lyrica [Pregabalin] Swelling  . Penicillins Swelling  . Levofloxacin Rash    Current Outpatient Prescriptions  Medication Sig Dispense Refill  . allopurinol (ZYLOPRIM) 300 MG tablet TAKE 1 TABLET(300 MG) BY MOUTH DAILY 30 tablet 3  . aspirin 81 MG tablet Take 81 mg by mouth daily.    Marland Kitchen atenolol (TENORMIN) 50 MG tablet Take 25 mg by mouth 2 (two) times daily.    Marland Kitchen atorvastatin (LIPITOR) 40 MG  tablet Take 40 mg by mouth every evening.     . Cholecalciferol (VITAMIN D) 2000 UNITS tablet Take 2,000 Units by mouth daily.    . Cyanocobalamin (B-12) 2000 MCG TABS Take by mouth.    . folic acid (FOLVITE) 1 MG tablet Take 1 mg by mouth daily.     . Linaclotide (LINZESS) 145 MCG CAPS capsule Take 145 mcg by mouth. Reported on 03/09/2015    . meloxicam (MOBIC) 15 MG tablet Take 1 tablet (15 mg total) by mouth as needed for pain. 30 tablet 0  . methotrexate (RHEUMATREX) 2.5 MG tablet Take 15 mg by mouth once a week.     Marland Kitchen MYRBETRIQ 25 MG TB24 tablet     . nystatin ointment (MYCOSTATIN) Apply 1 application topically 2 (two) times daily. 90 g 1  . pioglitazone (ACTOS) 30 MG tablet Take 30 mg by mouth daily.      . potassium chloride (MICRO-K)  10 MEQ CR capsule Take 1 capsule by mouth daily.    . potassium chloride SA (K-DUR,KLOR-CON) 20 MEQ tablet Take 1 tablet (20 mEq total) by mouth daily. 30 tablet 6  . ramipril (ALTACE) 5 MG capsule TAKE 1 CAPSULE BY MOUTH DAILY 30 capsule 6  . sertraline (ZOLOFT) 50 MG tablet TAKE 1 TABLET(50 MG) BY MOUTH DAILY 30 tablet 6  . tiZANidine (ZANAFLEX) 4 MG tablet Take 1 tablet (4 mg total) by mouth every 6 (six) hours as needed for muscle spasms. 30 tablet 0  . furosemide (LASIX) 40 MG tablet Take 1 tablet (40 mg total) by mouth daily. 30 tablet 6   No current facility-administered medications for this visit.    Social History   Social History  . Marital Status: Married    Spouse Name: N/A  . Number of Children: 2  . Years of Education: N/A   Occupational History  . retired      still works part-time for [employer] as a Therapist, occupational History Main Topics  . Smoking status: Never Smoker   . Smokeless tobacco: Never Used  . Alcohol Use: 0.0 oz/week    0 Standard drinks or equivalent per week     Comment: Once a month  . Drug Use: No  . Sexual Activity: No   Other Topics Concern  . Not on file   Social History Narrative   Alcohol- 1 to 2 per month.     Family History  Problem Relation Age of Onset  . Emphysema Father   . Clotting disorder Father   . Arthritis Father   . Heart disease Mother   . Arthritis Mother   . Diabetes Maternal Aunt   . Diabetes Paternal Aunt   . Arthritis Maternal Grandmother   . Hypertension Maternal Grandmother   . Diabetes Maternal Grandmother   . Arthritis Paternal Grandmother     ROS General: Negative; No fevers, chills, or night sweats HEENT: Negative; No changes in vision or hearing, sinus congestion, difficulty swallowing Pulmonary: Negative; No cough, wheezing, shortness of breath, hemoptysis Cardiovascular:  See HPI; No  GI: Negative; No nausea, vomiting, diarrhea, or abdominal pain GU: Negative; No dysuria, hematuria, or  difficulty voiding Musculoskeletal: Negative; no myalgias, joint pain, or weakness Hematologic/Oncologic: Negative; no easy bruising, bleeding Rheumatologic: Psoriatic arthritis, gout Endocrine: Positive for gout Neuro: Negative; no changes in balance, headaches Skin: Negative; No rashes or skin lesions Psychiatric: Negative; No behavioral problems, depression Sleep: Positive for obstructive sleep apnea on CPAP therapy; no hypersomnolence, bruxism, restless  legs, hypnogagnic hallucinations Other comprehensive 14 point system review is negative   Physical Exam BP 102/62 mmHg  Pulse 62  Ht 5' 11.5" (1.816 m)  Wt 335 lb 3.2 oz (152.046 kg)  BMI 46.10 kg/m2  SpO2 96%  Wt Readings from Last 3 Encounters:  07/04/15 335 lb 3.2 oz (152.046 kg)  06/27/15 340 lb 4 oz (154.336 kg)  06/20/15 359 lb 6 oz (163.011 kg)   General: Alert, oriented, no distress.  Skin: normal turgor, no rashes, warm and dry HEENT: Normocephalic, atraumatic. Pupils equal round and reactive to light; sclera anicteric; extraocular muscles intact; Fundi Mild arteriolar narrowing.  Disc flat. Nose without nasal septal hypertrophy Mouth/Parynx benign; Mallinpatti scale 3/4 Neck: No JVD, no carotid bruits; normal carotid upstroke Lungs: clear to ausculatation and percussion; no wheezing or rales Chest wall: without tenderness to palpitation Heart: PMI not displaced, RRR, s1 s2 normal, 1/6 systolic murmur in the aortic area, no diastolic murmur, no rubs, gallops, thrills, or heaves Abdomen: soft, nontender; no hepatosplenomehaly, BS+; abdominal aorta nontender and not dilated by palpation. Back: no CVA tenderness Pulses 2+ Musculoskeletal: full range of motion, normal strength, no joint deformities Extremities: Bilateral 1-2+ tense lower extremity edema. Neurologic: grossly nonfocal; Cranial nerves grossly wnl Psychologic: Normal mood and affect   ECG (independently read by me): Sinus rhythm at 62 bpm with PAC and  mild sinus arrhythmia.  QTc interval 432 ms.  No significant ST-T changes.  Q wave in lead 3.  LABS:  BMP Latest Ref Rng 06/27/2015 06/20/2015 04/29/2014  Glucose 70 - 99 mg/dL 169(H) 127(H) 189(H)  BUN 6 - 23 mg/dL _0 Creatinine 0.40 - 1.50 mg/dL 0.86 0.87 0.97  Sodium 135 - 145 mEq/L 136 137 136  Potassium 3.5 - 5.1 mEq/L 3.2(L) 3.8 3.4(L)  Chloride 96 - 112 mEq/L 99 103 99  CO2 19 - 32 mEq/L _1 Calcium 8.4 - 10.5 mg/dL 9.7 9.0 9.3     Hepatic Function Latest Ref Rng 06/27/2015 06/20/2015 04/29/2014  Total Protein 6.0 - 8.3 g/dL 6.5 5.7(L) 6.0  Albumin 3.5 - 5.2 g/dL 4.0 3.4(L) 3.7  AST 0 - 37 U/L _2 ALT 0 - 53 U/L 33 17 27  Alk Phosphatase 39 - 117 U/L 106 81 85  Total Bilirubin 0.2 - 1.2 mg/dL 1.1 1.2 1.1  Bilirubin, Direct 0.0 - 0.3 mg/dL 0.3 0.3 0.3    CBC Latest Ref Rng 06/27/2015 06/20/2015 07/19/2014  WBC 4.0 - 10.5 K/uL 8.4 6.5 7.6  Hemoglobin 13.0 - 17.0 g/dL 15.2 14.0 15.9  Hematocrit 39.0 - 52.0 % 45.5 41.4 46.4  Platelets 150.0 - 400.0 K/uL 275.0 209.0 165   Lab Results  Component Value Date   MCV 95.8 06/27/2015   MCV 95.3 06/20/2015   MCV 92.6 07/19/2014   Lab Results  Component Value Date   TSH 2.96 06/27/2015   Lab Results  Component Value Date   HGBA1C 6.7* 06/27/2015     BNP No results found for: BNP  ProBNP    Component Value Date/Time   PROBNP 141.0* 06/20/2015 1105     Lipid Panel     Component Value Date/Time   CHOL 97 06/27/2015 1120   TRIG 88.0 06/27/2015 1120   HDL 36.60* 06/27/2015 1120   CHOLHDL 3 06/27/2015 1120   VLDL 17.6 06/27/2015 1120   LDLCALC 42 06/27/2015 1120    RADIOLOGY: Dg Chest 2 View  06/20/2015  CLINICAL DATA:  Shortness of breath  with mild exertion 5 days ago ; the patient reports audible wheezing with productive cough in the mornings, onset of peripheral edema twos days ago. ; history of morbid obesity, diabetes. EXAM: CHEST  2 VIEW COMPARISON:  PA and lateral chest x-ray of June 10, 2013  FINDINGS: The lungs are adequately inflated. There are new small bilateral pleural effusions. The pulmonary interstitial markings are increased. The cardiac silhouette remains enlarged. The central pulmonary vascularity is mildly engorged. The trachea is midline. There is multilevel degenerative disc disease of the thoracic spine. No compression fracture is observed. IMPRESSION: CHF with mild interstitial edema and new small bilateral pleural effusions. There is no pneumonia. Electronically Signed   By: David  Martinique M.D.   On: 06/20/2015 16:10     ASSESSMENT AND PLAN: Mr. Artha Chiasson is a 70 year old Caucasian male who has a history of morbid obesity with a body mass index of 46.1, a history of hypertension, hyperlipidemia, type 2 diabetes mellitus, psoriatic arthritis, gout, and has developed progressive lower extremity tense edema.  He was recently started on Lasix 20 mg and admits to a 19 pound weight loss since institution of furosemide.  In 2016 an echo Doppler study showed normal systolic function and evidence for mild aortic valve stenosis with a mean gradient of 12 and peak gradient of 21 mm.  He presents with a history of progressive recurrent tense leg swelling.  He denies any chest pain or palpitations.  I have recommended adjustment of his atenolol dose such that he will take 25 mg twice a day.  I am discontinuing Maxide and in its place II have increased Lasix to 40 mg daily.  I have recommended that he try to discontinue Mobic since this may also contribute to edema and potential renal insufficiency on his increased diuretic regimen.  He has a cardiac murmur suggestive of mild aortic stenosis.  I will repeat an echo Doppler study to reassess his systolic and diastolic function as well as severity of AS.  Laboratory will be checked including a BNP, comprehensive metabolic panel, CBC, TSH, magnesium level, and lipid studies.  He is on potassium supplementation.  He has obstructive sleep apnea and  continues to use CPAP.  He is unaware of breakthrough snoring.  In the past he had been set at a 15 cm water pressure.  Calculus an Epworth Sleepiness Scale score today in the office and this endorsed at 8 arguing against excessive daytime sleepiness.  I will see him in 4-6 weeks for cardiology reevaluation and further recommendations will be made at that time.  Troy Sine, MD, [facility] 07/06/2015 5:57 PM

## 2015-07-14 LAB — TSH: TSH: 1.81 mIU/L (ref 0.40–4.50)

## 2015-07-14 LAB — COMPREHENSIVE METABOLIC PANEL
ALT: 19 U/L (ref 9–46)
AST: 25 U/L (ref 10–35)
Albumin: 3.5 g/dL — ABNORMAL LOW (ref 3.6–5.1)
Alkaline Phosphatase: 100 U/L (ref 40–115)
BUN: 17 mg/dL (ref 7–25)
CO2: 30 mmol/L (ref 20–31)
Calcium: 8.6 mg/dL (ref 8.6–10.3)
Chloride: 99 mmol/L (ref 98–110)
Creat: 0.95 mg/dL (ref 0.70–1.18)
Glucose, Bld: 131 mg/dL — ABNORMAL HIGH (ref 65–99)
Potassium: 3.5 mmol/L (ref 3.5–5.3)
Sodium: 140 mmol/L (ref 135–146)
Total Bilirubin: 1.2 mg/dL (ref 0.2–1.2)
Total Protein: 5.6 g/dL — ABNORMAL LOW (ref 6.1–8.1)

## 2015-07-14 LAB — LIPID PANEL
Cholesterol: 96 mg/dL — ABNORMAL LOW (ref 125–200)
HDL: 39 mg/dL — ABNORMAL LOW (ref >=40)
LDL Cholesterol: 37 mg/dL (ref <130)
Total CHOL/HDL Ratio: 2.5 Ratio (ref <=5.0)
Triglycerides: 101 mg/dL (ref <150)
VLDL: 20 mg/dL (ref <30)

## 2015-07-14 LAB — HEMOGLOBIN A1C
Hgb A1c MFr Bld: 6.8 % — ABNORMAL HIGH (ref <5.7)
Mean Plasma Glucose: 148 mg/dL

## 2015-07-14 LAB — MAGNESIUM: Magnesium: 1.6 mg/dL (ref 1.5–2.5)

## 2015-07-15 LAB — BRAIN NATRIURETIC PEPTIDE: Brain Natriuretic Peptide: 102.7 pg/mL — ABNORMAL HIGH (ref <100)

## 2015-08-01 ENCOUNTER — Other Ambulatory Visit: Payer: Self-pay

## 2015-08-01 ENCOUNTER — Ambulatory Visit (HOSPITAL_COMMUNITY): Payer: Medicare Other | Attending: Internal Medicine

## 2015-08-01 DIAGNOSIS — I358 Other nonrheumatic aortic valve disorders: Secondary | ICD-10-CM | POA: Insufficient documentation

## 2015-08-01 DIAGNOSIS — I110 Hypertensive heart disease with heart failure: Secondary | ICD-10-CM | POA: Diagnosis not present

## 2015-08-01 DIAGNOSIS — E119 Type 2 diabetes mellitus without complications: Secondary | ICD-10-CM | POA: Diagnosis not present

## 2015-08-01 DIAGNOSIS — I509 Heart failure, unspecified: Secondary | ICD-10-CM | POA: Insufficient documentation

## 2015-08-01 DIAGNOSIS — G4733 Obstructive sleep apnea (adult) (pediatric): Secondary | ICD-10-CM | POA: Insufficient documentation

## 2015-08-01 DIAGNOSIS — Z6841 Body Mass Index (BMI) 40.0 and over, adult: Secondary | ICD-10-CM | POA: Diagnosis not present

## 2015-08-01 LAB — ECHOCARDIOGRAM COMPLETE
AO mean calculated velocity dopler: 182 cm/s
AV Area VTI index: 0.77 cm2/m2
AV Area VTI: 2.01 cm2
AV Area mean vel: 2.01 cm2
AV Mean grad: 15 mmHg
AV Peak grad: 24 mmHg
AV VEL mean LVOT/AV: 0.44
AV area mean vel ind: 0.76 cm2/m2
AV peak Index: 0.76
AV pk vel: 245 cm/s
AV vel: 2.03
Ao pk vel: 0.44 m/s
Ao-asc: 44 cm
E decel time: 232 msec
E/e' ratio: 22.7
FS: 32 % (ref 28–44)
IVS/LV PW RATIO, ED: 1.04
LA ID, A-P, ES: 46 mm
LA diam end sys: 46 mm
LA diam index: 1.74 cm/m2
LA vol A4C: 53 ml
LA vol index: 21.2 mL/m2
LA vol: 56 mL
LV E/e' medial: 22.7
LV E/e'average: 22.7
LV PW d: 14.1 mm — AB (ref 0.6–1.1)
LV e' LATERAL: 4.89 cm/s
LVOT SV: 119 mL
LVOT VTI: 26.4 cm
LVOT area: 4.52 cm2
LVOT diameter: 24 mm
LVOT peak VTI: 0.45 cm
LVOT peak grad rest: 5 mmHg
LVOT peak vel: 109 cm/s
MV Dec: 232
MV Peak grad: 5 mmHg
MV pk A vel: 73.5 m/s
MV pk E vel: 111 m/s
TDI e' lateral: 4.89
TDI e' medial: 4.96
VTI: 58.8 cm
Valve area index: 0.77
Valve area: 2.03 cm2

## 2015-08-04 ENCOUNTER — Telehealth: Payer: Self-pay | Admitting: Cardiovascular Disease

## 2015-08-04 NOTE — Telephone Encounter (Signed)
New Message  Pt call requesting to speak with RN. Pt states he had Echo on 6/19 and wants to know if he needs to make a f/u appt. Please call back to discuss

## 2015-08-04 NOTE — Telephone Encounter (Signed)
Returned call - discussed echo results and recommendation for 4-6 week f/u  Will send request to scheduling for help getting pt on waitlist, and soonest available next appt.

## 2015-08-23 ENCOUNTER — Other Ambulatory Visit: Payer: Self-pay | Admitting: Family Medicine

## 2015-08-24 NOTE — Telephone Encounter (Signed)
Medication filled to pharmacy as requested.   

## 2015-09-02 ENCOUNTER — Encounter: Payer: Self-pay | Admitting: Neurology

## 2015-09-02 ENCOUNTER — Ambulatory Visit (INDEPENDENT_AMBULATORY_CARE_PROVIDER_SITE_OTHER): Payer: Medicare Other | Admitting: Neurology

## 2015-09-02 ENCOUNTER — Ambulatory Visit: Payer: Medicare Other | Admitting: Neurology

## 2015-09-02 VITALS — BP 118/66 | HR 77 | Ht 71.0 in | Wt 347.0 lb

## 2015-09-02 DIAGNOSIS — I10 Essential (primary) hypertension: Secondary | ICD-10-CM

## 2015-09-02 DIAGNOSIS — E119 Type 2 diabetes mellitus without complications: Secondary | ICD-10-CM

## 2015-09-02 DIAGNOSIS — E785 Hyperlipidemia, unspecified: Secondary | ICD-10-CM | POA: Diagnosis not present

## 2015-09-02 DIAGNOSIS — R413 Other amnesia: Secondary | ICD-10-CM

## 2015-09-02 MED ORDER — DONEPEZIL HCL 5 MG PO TABS: ORAL_TABLET | ORAL | Status: DC

## 2015-09-02 NOTE — Progress Notes (Signed)
NEUROLOGY FOLLOW UP OFFICE NOTE  Gary Butler [id number]  HISTORY OF PRESENT ILLNESS: I had the pleasure of seeing Gary Butler in follow-up in the neurology clinic on 09/02/2015. He is again accompanied by his wife who helps supplement the history today. The patient was last seen 8 months ago for worsening short-term memory. His MOCA score in November 2016  was normal 28/30. TSH and B12 normal. Records and images were personally reviewed where available.  I personally reviewed MRI brain without contrast which showed moderate chronic microvascular disease, no acute changes. Since his last visit, they both feel his memory has been worsening. He has not gotten lost driving, but takes longer to get places because he "goes bizarre ways" per wife. He has to stop in his driveway to think of where he is going. His wife is getting frustrated that he would say he would do something then forget to do it if he does not do it right away. She has to remind him that she told him something earlier. He denies any missed medications or missed bill payments. Word-finding difficulties unchanged. His mood is not any different, but his wife reports he is more even-tempered than before. 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 No difficulties with ADLs. He denies any headaches, dizziness, diplopia, dysarthria, dysphagia, neck pain, bowel/bladder dysfunction. No anosmia, tremors. No falls. He had some shortness of breath and was diagnosed with CHF.  HPI 12/27/2014: This is a very pleasant 70 yo RH man with vascular risk factors including hypertension, hyperlipidemia, diabetes, with worsening short-term memory. He and his wife started noticing memory changes around 2014, he forgets names of people he has known for 40 years, he needs to keep a notepad in his car for reminders, he has word-finding difficulties and says the wrong word sometimes. He has more difficulties multi-tasking. His wife expressed frustration that  she has to repeat everything she has told him, he would ask questions about something she had previously explained thoroughly to him. She reports "little details go through his head." Around 3 months ago, he was getting easily angry all the time, even when getting dressed. He was started on Sertraline, and his wife does not notice much of the aggravation he used to have. There is no family history of dementia. He denies any history of significant head injuries, he rarely drinks alcohol (once a month).   PAST MEDICAL HISTORY: Past Medical History  Diagnosis Date  . Diabetes mellitus   . Hypertension   . OSA (obstructive sleep apnea)   . Arthritis   . Heart murmur   . Sleep apnea   . History of gout   . Psoriatic arthritis ([place])   . History of skin cancer   . Constipation   . Tinnitus     MEDICATIONS: Current Outpatient Prescriptions on File Prior to Visit  Medication Sig Dispense Refill  . allopurinol (ZYLOPRIM) 300 MG tablet TAKE 1 TABLET(300 MG) BY MOUTH DAILY 30 tablet 3  . aspirin 81 MG tablet Take 81 mg by mouth daily.    Marland Kitchen atenolol (TENORMIN) 50 MG tablet Take 25 mg by mouth 2 (two) times daily.    Marland Kitchen atorvastatin (LIPITOR) 40 MG tablet Take 40 mg by mouth every evening.     . Cholecalciferol (VITAMIN D) 2000 UNITS tablet Take 2,000 Units by mouth daily.    . Cyanocobalamin (B-12) 2000 MCG TABS Take by mouth.    . folic acid (FOLVITE) 1 MG tablet Take 1 mg by  mouth daily.     . furosemide (LASIX) 40 MG tablet Take 1 tablet (40 mg total) by mouth daily. 30 tablet 6  . Linaclotide (LINZESS) 145 MCG CAPS capsule Take 145 mcg by mouth. Reported on 03/09/2015    . meloxicam (MOBIC) 15 MG tablet Take 1 tablet (15 mg total) by mouth as needed for pain. 30 tablet 0  . methotrexate (RHEUMATREX) 2.5 MG tablet Take 15 mg by mouth once a week.     Marland Kitchen MYRBETRIQ 25 MG TB24 tablet     . nystatin ointment (MYCOSTATIN) Apply 1 application topically 2 (two) times daily. 90 g 1  . pioglitazone  (ACTOS) 30 MG tablet TAKE 1 TABLET BY MOUTH DAILY 30 tablet 6  . potassium chloride (MICRO-K) 10 MEQ CR capsule Take 1 capsule by mouth daily.    . potassium chloride SA (K-DUR,KLOR-CON) 20 MEQ tablet Take 1 tablet (20 mEq total) by mouth daily. 30 tablet 6  . ramipril (ALTACE) 5 MG capsule TAKE 1 CAPSULE BY MOUTH DAILY 30 capsule 6  . sertraline (ZOLOFT) 50 MG tablet TAKE 1 TABLET(50 MG) BY MOUTH DAILY 30 tablet 6  . tiZANidine (ZANAFLEX) 4 MG tablet Take 1 tablet (4 mg total) by mouth every 6 (six) hours as needed for muscle spasms. 30 tablet 0   No current facility-administered medications on file prior to visit.    ALLERGIES: Allergies  Allergen Reactions  . Lyrica [Pregabalin] Swelling  . Penicillins Swelling  . Levofloxacin Rash    FAMILY HISTORY: Family History  Problem Relation Age of Onset  . Emphysema Father   . Clotting disorder Father   . Arthritis Father   . Heart disease Mother   . Arthritis Mother   . Diabetes Maternal Aunt   . Diabetes Paternal Aunt   . Arthritis Maternal Grandmother   . Hypertension Maternal Grandmother   . Diabetes Maternal Grandmother   . Arthritis Paternal Grandmother     SOCIAL HISTORY: Social History   Social History  . Marital Status: Married    Spouse Name: N/A  . Number of Children: 2  . Years of Education: N/A   Occupational History  . retired      still works part-time for [employer] as a Therapist, occupational History Main Topics  . Smoking status: Never Smoker   . Smokeless tobacco: Never Used  . Alcohol Use: 0.0 oz/week    0 Standard drinks or equivalent per week     Comment: Once a month  . Drug Use: No  . Sexual Activity: No   Other Topics Concern  . Not on file   Social History Narrative   Alcohol- 1 to 2 per month.     REVIEW OF SYSTEMS: Constitutional: No fevers, chills, or sweats, no generalized fatigue, change in appetite Eyes: No visual changes, double vision, eye pain Ear, nose and throat: No hearing  loss, ear pain, nasal congestion, sore throat Cardiovascular: No chest pain, palpitations Respiratory:  No shortness of breath at rest or with exertion, wheezes GastrointestinaI: No nausea, vomiting, diarrhea, abdominal pain, fecal incontinence Genitourinary:  No dysuria, urinary retention or frequency Musculoskeletal:  No neck pain, back pain Integumentary: No rash, pruritus, skin lesions Neurological: as above Psychiatric: No depression, insomnia, anxiety Endocrine: No palpitations, fatigue, diaphoresis, mood swings, change in appetite, change in weight, increased thirst Hematologic/Lymphatic:  No anemia, purpura, petechiae. Allergic/Immunologic: no itchy/runny eyes, nasal congestion, recent allergic reactions, rashes  PHYSICAL EXAM: Filed Vitals:   09/02/15 0856  BP:  118/66  Pulse: 77   General: No acute distress Head:  Normocephalic/atraumatic Neck: supple, no paraspinal tenderness, full range of motion Heart:  Regular rate and rhythm Lungs:  Clear to auscultation bilaterally Back: No paraspinal tenderness Skin/Extremities: No rash, no edema Neurological Exam: alert and oriented to person, place, and time. No aphasia or dysarthria. Fund of knowledge is appropriate.  Recent and remote memory are intact.  Attention and concentration are normal.    Able to name objects and repeat phrases. Cranial nerves: Pupils equal, round, reactive to light. No facial asymmetry. Tongue, uvula, palate midline.  Motor: Moves all extremities symmetrically.  Gait narrow-based and steady, no ataxia.  Montreal Cognitive Assessment  09/02/2015 12/27/2014  Visuospatial/ Executive (0/5) 5 5  Naming (0/3) 3 3  Attention: Read list of digits (0/2) 2 2  Attention: Read list of letters (0/1) 1 1  Attention: Serial 7 subtraction starting at 100 (0/3) 3 3  Language: Repeat phrase (0/2) 2 2  Language : Fluency (0/1) 1 0  Abstraction (0/2) 2 2  Delayed Recall (0/5) 3 4  Orientation (0/6) 6 6  Total 28 28    Adjusted Score (based on education) 28 28   IMPRESSION: This is a pleasant 70 yo RH man with vascular risk factors including hypertension, hyperlipidemia, diabetes, who presented with worsening short-term memory. MOCA score today again normal 28/30 (similar to November 2016). He and his wife feel memory is worsening. We discussed the option of starting low dose Aricept, we discussed expectations from the medication and side effects. He would like to start the medication. We also discussed the importance of control of vascular risk factors, physical exercise, and brain stimulation exercises for brain health. Continue to monitor driving. He will follow-up in 6 months and knows to call for any changes.   Thank you for allowing me to participate in his care.  Please do not hesitate to call for any questions or concerns.  The duration of this appointment visit was 25 minutes of face-to-face time with the patient.  Greater than 50% of this time was spent in counseling, explanation of diagnosis, planning of further management, and coordination of care.   Ellouise Newer, M.D.   CC: Dr. Birdie Riddle

## 2015-09-02 NOTE — Patient Instructions (Signed)
1. Start Aricept 5mg : Take 1/2 tablet daily for 1 month, then increase to 1 tablet daily 2. Control of BP, cholesterol, sugars, as well as physical exercise and brain stimulation exercises are important for brain health 3. Follow-up in 6 months, call for any problems

## 2015-09-14 ENCOUNTER — Encounter: Payer: Self-pay | Admitting: Family Medicine

## 2015-09-14 ENCOUNTER — Ambulatory Visit (INDEPENDENT_AMBULATORY_CARE_PROVIDER_SITE_OTHER): Payer: Medicare Other | Admitting: Family Medicine

## 2015-09-14 VITALS — BP 122/78 | HR 59 | Temp 97.9°F | Resp 16 | Ht 71.0 in | Wt 349.2 lb

## 2015-09-14 DIAGNOSIS — W5503XA Scratched by cat, initial encounter: Secondary | ICD-10-CM

## 2015-09-14 DIAGNOSIS — S60511A Abrasion of right hand, initial encounter: Secondary | ICD-10-CM | POA: Diagnosis not present

## 2015-09-14 NOTE — Patient Instructions (Signed)
Follow up as scheduled/needed Continue to keep area clean and dry.  Apply peroxide/alcohol once daily and then antibiotic ointment If there is any worsening redness, drainage or other concerns- let me know! Call with any questions or concerns Enjoy the rest of the summer!!!

## 2015-09-14 NOTE — Progress Notes (Signed)
Pre visit review using our clinic review tool, if applicable. No additional management support is needed unless otherwise documented below in the visit note. 

## 2015-09-14 NOTE — Progress Notes (Signed)
   Subjective:    Patient ID: Gary Butler, male    DOB: 09-06-1945, 70 y.o.   MRN: [id number]  HPI Cat scratch- dorsum of R hand, occurred 1 week ago.  Area is red, TTP.  No drainage from area.  Will continue to bleed periodically.  No warmth, less red than previously.  Was on abx last week due to dental appt.   Review of Systems For ROS see HPI     Objective:   Physical Exam  Constitutional: He appears well-developed and well-nourished. No distress.  Skin: Skin is warm and dry. There is erythema (only mild erythema on dorsum of R hand w/ central scab).  Psychiatric: He has a normal mood and affect. His behavior is normal. Thought content normal.  Vitals reviewed.         Assessment & Plan:  Cat scratch- no evidence of infxn today.  Reviewed supportive care and red flags that should prompt return.  Pt expressed understanding and is in agreement w/ plan.

## 2015-09-24 ENCOUNTER — Other Ambulatory Visit: Payer: Self-pay | Admitting: Family Medicine

## 2015-09-27 ENCOUNTER — Ambulatory Visit (INDEPENDENT_AMBULATORY_CARE_PROVIDER_SITE_OTHER): Payer: Medicare Other | Admitting: Family Medicine

## 2015-09-27 ENCOUNTER — Encounter: Payer: Self-pay | Admitting: Family Medicine

## 2015-09-27 VITALS — BP 123/83 | HR 57 | Temp 98.0°F | Resp 16 | Ht 71.0 in | Wt 348.1 lb

## 2015-09-27 DIAGNOSIS — E119 Type 2 diabetes mellitus without complications: Secondary | ICD-10-CM

## 2015-09-27 MED ORDER — ATORVASTATIN CALCIUM 40 MG PO TABS: 40.0000 mg | ORAL_TABLET | Freq: Every evening | ORAL | 1 refills | Status: DC

## 2015-09-27 NOTE — Progress Notes (Signed)
Pre visit review using our clinic review tool, if applicable. No additional management support is needed unless otherwise documented below in the visit note. 

## 2015-09-27 NOTE — Patient Instructions (Signed)
Schedule your complete physical in 3-4 months No need to repeat labs at this time- things look good!! Continue to work on healthy diet and regular exercise- you can do it!!! You are up to date on foot exam and eye exam- great news!!! Call with any questions or concerns Have a wonderful trip!!!

## 2015-09-27 NOTE — Progress Notes (Signed)
   Subjective:    Patient ID: Gary Butler, male    DOB: Sep 02, 1945, 70 y.o.   MRN: [id number]  HPI DM- chronic problem.  Pt is currently on Actos daily.  Hx of good control.  A1C 2 months ago was 6.8.  UTD on eye exam, foot exam.  On ACE for renal protection.  Pt just had labs done by cardiology.  No need to repeat today.  Denies symptomatic lows.  No numbness/tingling of hands/feet.  Home CBGs 100-110- pt checks once weekly.  Pt is attempting low carb diet, no regular exercise.  No CP, rare intermittent SOB, HAs, visual changes, edema.   Review of Systems For ROS see HPI     Objective:   Physical Exam  Constitutional: He is oriented to person, place, and time. He appears well-developed and well-nourished. No distress.  HENT:  Head: Normocephalic and atraumatic.  Eyes: Conjunctivae and EOM are normal. Pupils are equal, round, and reactive to light.  Neck: Normal range of motion. Neck supple. No thyromegaly present.  Cardiovascular: Normal rate, regular rhythm, normal heart sounds and intact distal pulses.   No murmur heard. Pulmonary/Chest: Effort normal and breath sounds normal. No respiratory distress.  Abdominal: Soft. Bowel sounds are normal. He exhibits no distension.  Musculoskeletal: He exhibits no edema.  Lymphadenopathy:    He has no cervical adenopathy.  Neurological: He is alert and oriented to person, place, and time. No cranial nerve deficit.  Skin: Skin is warm and dry.  Psychiatric: He has a normal mood and affect. His behavior is normal.  Vitals reviewed.         Assessment & Plan:

## 2015-09-27 NOTE — Assessment & Plan Note (Signed)
Chronic problem.  On Actos daily.  Pt reports home CBGs are well controlled.  Currently asymptomatic.  UTD on foot exam, eye exam.  Stressed need for healthy diet and exercise as able.  On ACE for renal protection.  No need to repeat labs so soon (done by Cardiology 2 months ago).  Will continue to follow.

## 2015-10-21 ENCOUNTER — Other Ambulatory Visit: Payer: Self-pay | Admitting: Family Medicine

## 2015-10-28 ENCOUNTER — Other Ambulatory Visit: Payer: Self-pay | Admitting: Family Medicine

## 2015-11-19 ENCOUNTER — Other Ambulatory Visit: Payer: Self-pay | Admitting: Family Medicine

## 2015-12-29 ENCOUNTER — Other Ambulatory Visit: Payer: Self-pay | Admitting: Family Medicine

## 2016-01-03 ENCOUNTER — Other Ambulatory Visit: Payer: Self-pay | Admitting: Family Medicine

## 2016-02-20 ENCOUNTER — Encounter: Payer: Self-pay | Admitting: Family Medicine

## 2016-02-20 ENCOUNTER — Ambulatory Visit (INDEPENDENT_AMBULATORY_CARE_PROVIDER_SITE_OTHER): Payer: Medicare Other | Admitting: Family Medicine

## 2016-02-20 VITALS — BP 124/74 | HR 76 | Temp 98.1°F | Resp 16 | Ht 71.0 in | Wt 353.5 lb

## 2016-02-20 DIAGNOSIS — Z125 Encounter for screening for malignant neoplasm of prostate: Secondary | ICD-10-CM | POA: Diagnosis not present

## 2016-02-20 DIAGNOSIS — Z0000 Encounter for general adult medical examination without abnormal findings: Secondary | ICD-10-CM | POA: Diagnosis not present

## 2016-02-20 DIAGNOSIS — E785 Hyperlipidemia, unspecified: Secondary | ICD-10-CM | POA: Diagnosis not present

## 2016-02-20 DIAGNOSIS — I10 Essential (primary) hypertension: Secondary | ICD-10-CM

## 2016-02-20 DIAGNOSIS — Z23 Encounter for immunization: Secondary | ICD-10-CM

## 2016-02-20 DIAGNOSIS — E119 Type 2 diabetes mellitus without complications: Secondary | ICD-10-CM | POA: Diagnosis not present

## 2016-02-20 LAB — BASIC METABOLIC PANEL
BUN: 17 mg/dL (ref 6–23)
CO2: 34 mEq/L — ABNORMAL HIGH (ref 19–32)
Calcium: 9.3 mg/dL (ref 8.4–10.5)
Chloride: 98 mEq/L (ref 96–112)
Creatinine, Ser: 1.06 mg/dL (ref 0.40–1.50)
GFR: 73.15 mL/min (ref >60.00)
Glucose, Bld: 196 mg/dL — ABNORMAL HIGH (ref 70–99)
Potassium: 3.4 mEq/L — ABNORMAL LOW (ref 3.5–5.1)
Sodium: 139 mEq/L (ref 135–145)

## 2016-02-20 LAB — TSH: TSH: 2.78 u[IU]/mL (ref 0.35–4.50)

## 2016-02-20 LAB — CBC WITH DIFFERENTIAL/PLATELET
Basophils Absolute: 0.1 10*3/uL (ref 0.0–0.1)
Basophils Relative: 0.7 % (ref 0.0–3.0)
Eosinophils Absolute: 0.4 10*3/uL (ref 0.0–0.7)
Eosinophils Relative: 4.4 % (ref 0.0–5.0)
HCT: 42.9 % (ref 39.0–52.0)
Hemoglobin: 14.4 g/dL (ref 13.0–17.0)
Lymphocytes Relative: 11.9 % — ABNORMAL LOW (ref 12.0–46.0)
Lymphs Abs: 1.1 10*3/uL (ref 0.7–4.0)
MCHC: 33.7 g/dL (ref 30.0–36.0)
MCV: 95.5 fl (ref 78.0–100.0)
Monocytes Absolute: 0.5 10*3/uL (ref 0.1–1.0)
Monocytes Relative: 5.6 % (ref 3.0–12.0)
Neutro Abs: 7 10*3/uL (ref 1.4–7.7)
Neutrophils Relative %: 77.4 % — ABNORMAL HIGH (ref 43.0–77.0)
Platelets: 208 10*3/uL (ref 150.0–400.0)
RBC: 4.49 Mil/uL (ref 4.22–5.81)
RDW: 17.3 % — ABNORMAL HIGH (ref 11.5–15.5)
WBC: 9 10*3/uL (ref 4.0–10.5)

## 2016-02-20 LAB — LIPID PANEL
Cholesterol: 97 mg/dL (ref 0–200)
HDL: 39.2 mg/dL (ref >39.00)
LDL Cholesterol: 35 mg/dL (ref 0–99)
NonHDL: 58.16
Total CHOL/HDL Ratio: 2
Triglycerides: 114 mg/dL (ref 0.0–149.0)
VLDL: 22.8 mg/dL (ref 0.0–40.0)

## 2016-02-20 LAB — PSA, MEDICARE: PSA: 1.07 ng/ml (ref 0.10–4.00)

## 2016-02-20 LAB — HEPATIC FUNCTION PANEL
ALT: 23 U/L (ref 0–53)
AST: 20 U/L (ref 0–37)
Albumin: 3.8 g/dL (ref 3.5–5.2)
Alkaline Phosphatase: 102 U/L (ref 39–117)
Bilirubin, Direct: 0.3 mg/dL (ref 0.0–0.3)
Total Bilirubin: 1.1 mg/dL (ref 0.2–1.2)
Total Protein: 6 g/dL (ref 6.0–8.3)

## 2016-02-20 LAB — HEMOGLOBIN A1C: Hgb A1c MFr Bld: 6.5 % (ref 4.6–6.5)

## 2016-02-20 NOTE — Progress Notes (Signed)
   Subjective:    Patient ID: Gary Butler, male    DOB: [birth year], 71 y.o.   MRN: [id number]  HPI Here today for CPE.  Risk Factors: DM- chronic problem, on Actos daily w/ hx of good control.  UTD on foot exam, eye exam, on ACE for renal protection Hyperlipidemia- chronic problem, on Lipitor. HTN- chronic problem, on Triamterene-HCTZ, atenolol, Lasix, Ramipril w/ good control Obesity- chronic problem.  Pt's BMI is approaching 50.  He has gained 5 lbs since last visit.  Not getting any regular exercise Physical Activity: limited due to Rheumatoid arthritis Fall Risk: low Depression: denies current sxs, on Zoloft Hearing: decreased despite bilateral hearing aides ADL's: independent Cognitive: normal linear thought process, some memory deficits Home Safety: safe at home, lives w/ wife Height, Weight, BMI, Visual Acuity: see vitals, vision corrected to 20/20 w/ glasses Counseling: UTD on urology (Dr Karsten Ro), UTD on colonoscopy.  Due for Pneumovax today Care team reviewed and updated w/ pt Labs Ordered: See A&P Care Plan: See A&P    Review of Systems Patient reports no vision/hearing changes, anorexia, fever ,adenopathy, persistant/recurrent hoarseness, swallowing issues, chest pain, palpitations, edema, persistant/recurrent cough, hemoptysis, dyspnea (rest,exertional, paroxysmal nocturnal), gastrointestinal  bleeding (melena, rectal bleeding), abdominal pain, excessive heart burn, GU symptoms (dysuria, hematuria, voiding/incontinence issues) syncope, focal weakness, memory loss, numbness & tingling, skin/hair/nail changes, depression, anxiety, abnormal bruising/bleeding, musculoskeletal symptoms/signs.     Objective:   Physical Exam General Appearance:    Alert, cooperative, no distress, appears stated age  Head:    Normocephalic, without obvious abnormality, atraumatic  Eyes:    PERRL, conjunctiva/corneas clear, EOM's intact, fundi    benign, both eyes       Ears:    Normal TM's and  external ear canals, both ears  Nose:   Nares normal, septum midline, mucosa normal, no drainage   or sinus tenderness  Throat:   Lips, mucosa, and tongue normal; teeth and gums normal  Neck:   Supple, symmetrical, trachea midline, no adenopathy;       thyroid:  No enlargement/tenderness/nodules  Back:     Symmetric, no curvature, ROM normal, no CVA tenderness  Lungs:     Clear to auscultation bilaterally, respirations unlabored  Chest wall:    No tenderness or deformity  Heart:    Regular rate and rhythm, S1 and S2 normal, no murmur, rub   or gallop  Abdomen:     Soft, non-tender, bowel sounds active all four quadrants,    no masses, no organomegaly  Genitalia:    Deferred to urology  Rectal:    Extremities:   Extremities normal, atraumatic, no cyanosis or edema  Pulses:   2+ and symmetric all extremities  Skin:   Skin color, texture, turgor normal, no rashes or lesions  Lymph nodes:   Cervical, supraclavicular, and axillary nodes normal  Neurologic:   CNII-XII intact. Normal strength, sensation and reflexes      throughout          Assessment & Plan:

## 2016-02-20 NOTE — Patient Instructions (Signed)
Follow up in 3-4 months to recheck diabetes We'll notify you of your lab results and make any changes if needed Continue to work on healthy diet and walk as you are able You are up to date on colonoscopy until 2024- yay!!! After today's Prevnar you are up to date on immunizations- yay!!! Call Dr Karsten Ro about the bladder Call with any questions or concerns Happy New Year!!!

## 2016-02-20 NOTE — Progress Notes (Signed)
Pre visit review using our clinic review tool, if applicable. No additional management support is needed unless otherwise documented below in the visit note. 

## 2016-02-26 NOTE — Assessment & Plan Note (Signed)
Chronic problem.  Tolerating statin w/o difficulty.  Stressed need for healthy diet and regular exercise (as able).  Check labs.  Adjust meds prn

## 2016-02-26 NOTE — Assessment & Plan Note (Signed)
Chronic problem.  On Actos daily w/ hx of good control.  UTD on foot exam, eye exam.  On ACE for renal protection.  Check labs.  Adjust meds prn

## 2016-02-26 NOTE — Assessment & Plan Note (Signed)
Pt's PE unchanged from previous and WNL w/ exception of obesity.  UTD on urology and colonoscopy.  Pneumovax given today.  Written screening schedule updated and given to pt.  Check labs.  Anticipatory guidance provided.

## 2016-02-26 NOTE — Assessment & Plan Note (Signed)
Chronic problem.  Well controlled on current regimen.  Asymptomatic at this time.  Check labs.  No anticipated med changes.

## 2016-02-26 NOTE — Assessment & Plan Note (Signed)
Ongoing issue for pt.  Stressed need for healthy diet and exercise as able to improve overall health.  Check labs to risk stratify.  Will follow.

## 2016-03-05 ENCOUNTER — Ambulatory Visit (INDEPENDENT_AMBULATORY_CARE_PROVIDER_SITE_OTHER): Payer: Medicare Other | Admitting: Neurology

## 2016-03-05 ENCOUNTER — Encounter: Payer: Self-pay | Admitting: Neurology

## 2016-03-05 VITALS — BP 124/60 | HR 73 | Ht 71.0 in | Wt 360.0 lb

## 2016-03-05 DIAGNOSIS — R413 Other amnesia: Secondary | ICD-10-CM

## 2016-03-05 NOTE — Progress Notes (Signed)
NEUROLOGY FOLLOW UP OFFICE NOTE  Bucky Lenkiewicz [id number]  HISTORY OF PRESENT ILLNESS: I had the pleasure of seeing Mikhai Hanser in follow-up in the neurology clinic on 03/05/2016. He is again accompanied by his wife who helps supplement the history today. The patient was last seen 5 months ago for worsening short-term memory. His MOCA score in July 2017 was 28/30 (similar to November 2016), however he and his wife reported worsening of memory, it takes him longer to get to places, he takes several turns "bizarre ways" to get there. He is getting more frustrated with himself. He opted to start Donepezil, which he is tolerating without side effects. He returns today with his wife reporting that he is continuing to struggle, especially with directions. He has to stop at the end of their driveway to think where he is going. He forgets to take the trash out. His wife states they fight all the time to get to places, it is getting harder and harder on a daily basis. He feels his short term memory is just terrible, he forgets what he walked into a room for. He missed a bill payment this month. No missed medications. His wife is not sure if it is her, but he is having a harder time understanding what she is saying. She states his personality has changed, every little thing is a problem, he is more negative. He gets frustrated very easily and throws things (ie remote control). Little things that are not really a problem become frustrations for him. No difficulties with ADLs. He denies any headaches, dizziness, diplopia, dysarthria, dysphagia, neck pain, bowel/bladder dysfunction. No anosmia, tremors. No falls.   HPI 12/27/2014: This is a very pleasant 71 yo RH man with vascular risk factors including hypertension, hyperlipidemia, diabetes, with worsening short-term memory. He and his wife started noticing memory changes around 2014, he forgets names of people he has known for 40 years, he needs to keep a notepad in his  car for reminders, he has word-finding difficulties and says the wrong word sometimes. He has more difficulties multi-tasking. His wife expressed frustration that she has to repeat everything she has told him, he would ask questions about something she had previously explained thoroughly to him. She reports "little details go through his head." Around 3 months ago, he was getting easily angry all the time, even when getting dressed. He was started on Sertraline, and his wife does not notice much of the aggravation he used to have. There is no family history of dementia. He denies any history of significant head injuries, he rarely drinks alcohol (once a month).  Diagnostic Data: TSH and B12 normal. MRI brain without contrast which showed moderate chronic microvascular disease, no acute changes.   PAST MEDICAL HISTORY: Past Medical History:  Diagnosis Date  . Arthritis   . Constipation   . Diabetes mellitus   . Heart murmur   . History of gout   . History of skin cancer   . Hypertension   . OSA (obstructive sleep apnea)   . Psoriatic arthritis ([place])   . Sleep apnea   . Tinnitus     MEDICATIONS: Current Outpatient Prescriptions on File Prior to Visit  Medication Sig Dispense Refill  . allopurinol (ZYLOPRIM) 300 MG tablet TAKE 1 TABLET(300 MG) BY MOUTH DAILY 30 tablet 6  . aspirin 81 MG tablet Take 81 mg by mouth daily.    Marland Kitchen atenolol (TENORMIN) 50 MG tablet TAKE 1 TABLET BY MOUTH EVERY DAY 90 tablet  1  . atorvastatin (LIPITOR) 40 MG tablet Take 1 tablet (40 mg total) by mouth every evening. 90 tablet 1  . Cholecalciferol (VITAMIN D) 2000 UNITS tablet Take 2,000 Units by mouth daily.    . Cyanocobalamin (B-12) 2000 MCG TABS Take by mouth.    . diclofenac (VOLTAREN) 75 MG EC tablet TK 1 T PO BID WF  1  . donepezil (ARICEPT) 5 MG tablet Take 1/2 tablet daily for 1 month, then increase to 1 tablet daily 30 tablet 11  . folic acid (FOLVITE) 1 MG tablet Take 1 mg by mouth daily.     .  furosemide (LASIX) 20 MG tablet TAKE 1 TABLET(20 MG) BY MOUTH DAILY 30 tablet 6  . Linaclotide (LINZESS) 145 MCG CAPS capsule Take 145 mcg by mouth. Reported on 03/09/2015    . meloxicam (MOBIC) 15 MG tablet Take 1 tablet (15 mg total) by mouth as needed for pain. 30 tablet 0  . methotrexate (RHEUMATREX) 2.5 MG tablet Take 15 mg by mouth once a week.     Marland Kitchen MYRBETRIQ 25 MG TB24 tablet     . nystatin ointment (MYCOSTATIN) Apply 1 application topically 2 (two) times daily. 90 g 1  . pioglitazone (ACTOS) 30 MG tablet TAKE 1 TABLET BY MOUTH DAILY 30 tablet 6  . potassium chloride (MICRO-K) 10 MEQ CR capsule TAKE ONE CAPSULE BY MOUTH EVERY DAY 90 capsule 1  . potassium chloride SA (K-DUR,KLOR-CON) 20 MEQ tablet Take 1 tablet (20 mEq total) by mouth daily. 30 tablet 6  . ramipril (ALTACE) 5 MG capsule TAKE 1 CAPSULE BY MOUTH DAILY 30 capsule 6  . sertraline (ZOLOFT) 50 MG tablet TAKE 1 TABLET(50 MG) BY MOUTH DAILY 90 tablet 1  . tiZANidine (ZANAFLEX) 4 MG tablet Take 1 tablet (4 mg total) by mouth every 6 (six) hours as needed for muscle spasms. 30 tablet 0  . triamterene-hydrochlorothiazide (MAXZIDE) 75-50 MG tablet TAKE 1/2 TABLET BY MOUTH EVERY MORNING 30 tablet 6   No current facility-administered medications on file prior to visit.     ALLERGIES: Allergies  Allergen Reactions  . Lyrica [Pregabalin] Swelling  . Penicillins Swelling  . Levofloxacin Rash    FAMILY HISTORY: Family History  Problem Relation Age of Onset  . Emphysema Father   . Clotting disorder Father   . Arthritis Father   . Heart disease Mother   . Arthritis Mother   . Arthritis Maternal Grandmother   . Hypertension Maternal Grandmother   . Diabetes Maternal Grandmother   . Arthritis Paternal Grandmother   . Diabetes Maternal Aunt   . Diabetes Paternal Aunt     SOCIAL HISTORY: Social History   Social History  . Marital status: Married    Spouse name: N/A  . Number of children: 2  . Years of education: N/A    Occupational History  . retired      still works part-time for [employer] as a Therapist, occupational History Main Topics  . Smoking status: Never Smoker  . Smokeless tobacco: Never Used  . Alcohol use 0.0 oz/week     Comment: Once a month  . Drug use: No  . Sexual activity: No   Other Topics Concern  . Not on file   Social History Narrative   Alcohol- 1 to 2 per month.     REVIEW OF SYSTEMS: Constitutional: No fevers, chills, or sweats, no generalized fatigue, change in appetite Eyes: No visual changes, double vision, eye pain Ear, nose and  throat: No hearing loss, ear pain, nasal congestion, sore throat Cardiovascular: No chest pain, palpitations Respiratory:  No shortness of breath at rest or with exertion, wheezes GastrointestinaI: No nausea, vomiting, diarrhea, abdominal pain, fecal incontinence Genitourinary:  No dysuria, urinary retention or frequency Musculoskeletal:  No neck pain, back pain Integumentary: No rash, pruritus, skin lesions Neurological: as above Psychiatric: No depression, insomnia, anxiety Endocrine: No palpitations, fatigue, diaphoresis, mood swings, change in appetite, change in weight, increased thirst Hematologic/Lymphatic:  No anemia, purpura, petechiae. Allergic/Immunologic: no itchy/runny eyes, nasal congestion, recent allergic reactions, rashes  PHYSICAL EXAM: Vitals:   03/05/16 1126  BP: 124/60  Pulse: 73   General: No acute distress Head:  Normocephalic/atraumatic Neck: supple, no paraspinal tenderness, full range of motion Heart:  Regular rate and rhythm Lungs:  Clear to auscultation bilaterally Back: No paraspinal tenderness Skin/Extremities: No rash, no edema Neurological Exam: alert and oriented to person, place, and time. No aphasia or dysarthria. Fund of knowledge is appropriate.  Recent and remote memory are intact. 2/3 delayed recall. Attention and concentration are normal.    Able to name objects and repeat phrases. Cranial  nerves: Pupils equal, round, reactive to light. No facial asymmetry. Tongue, uvula, palate midline.  Motor: Moves all extremities symmetrically.  Gait narrow-based and steady, no ataxia.  IMPRESSION: This is a pleasant 71 yo RH man with vascular risk factors including hypertension, hyperlipidemia, diabetes, who presented with worsening short-term memory. MOCA score in July 2017 was 28/30, similar to November 2016, however he and his wife continue to notice worsening memory. His wife is having a harder time dealing with his forgetfulness and wonders how she can help him better. He was started on donepezil on his last visit, no side effects. His wife has noticed a change in personality, he gets more frustrated easily and is "more negative." He will benefit from Neurocognitive testing to further evaluate memory complaints and assess contribution of mood on memory. Continue current medications. We again discussed the importance of control of vascular risk factors, physical exercise, and brain stimulation exercises for brain health. Continue to monitor driving. He will follow-up in 3 months and knows to call for any changes.   Thank you for allowing me to participate in his care.  Please do not hesitate to call for any questions or concerns.  The duration of this appointment visit was 15 minutes of face-to-face time with the patient.  Greater than 50% of this time was spent in counseling, explanation of diagnosis, planning of further management, and coordination of care.   Ellouise Newer, M.D.   CC: Dr. Birdie Riddle

## 2016-03-05 NOTE — Patient Instructions (Signed)
1. Continue all your medications 2. Schedule Neurocognitive testing with Dr. Si Raider 3. Follow-up in 3 months  You have been referred for a neurocognitive evaluation in our office.   The evaluation consists of three appointments.   1. The first appointment is about 45 minutes and is a clinical interview with the neuropsychologist (Dr. Macarthur Critchley). Please bring someone with you to this appointment if possible, as it is helpful for Dr. Si Raider to hear from both you and another adult who knows you well.   2. The second appointment is 2-3 hours long and is with the psychometrician Milana Kidney). You will complete a variety of tasks- mostly question-and-answer, some paper-and-pencil. There is nothing you need to do to prepare for this appointment, but having a good night's sleep prior to the testing, and bringing eyeglasses and hearing aids (if you wear them), is advised.   3. The final appointment is a follow-up with Dr. Si Raider where she will go over the test results with you and provide recommendations and a plan of care. This appointment is about 30 minutes.  If you would like a family member to receive this information as well, please bring them to the appointment.   We have to reserve several hours of the neuropsychologist's time and the psychometrician's time for your appointment. As such, please note that there is a No-Show fee of $100. If you are unable to attend any of your appointments, please contact our office as soon as possible to reschedule.

## 2016-03-27 ENCOUNTER — Other Ambulatory Visit: Payer: Self-pay | Admitting: Family Medicine

## 2016-03-30 ENCOUNTER — Telehealth: Payer: Self-pay | Admitting: Family Medicine

## 2016-03-30 NOTE — Telephone Encounter (Signed)
Could you see what information they might have?

## 2016-03-30 NOTE — Telephone Encounter (Signed)
Rep from [facility] calling RE annual wellness visit for patient as well as blood pressure results for the patient.  Thank you,  -LL

## 2016-03-30 NOTE — Telephone Encounter (Signed)
Left message with [employer] to call back to discuss

## 2016-04-02 NOTE — Telephone Encounter (Signed)
[employer] needed: Date of last CPE/AWV -     02/20/16 BP at that appointment - 124/74 Next appointment - 05/21/16  I have left a detailed message on a secure line with [facility] with this information.  Should they call back, please give them the updated information.

## 2016-04-15 ENCOUNTER — Other Ambulatory Visit: Payer: Self-pay | Admitting: Family Medicine

## 2016-04-17 ENCOUNTER — Encounter: Payer: Self-pay | Admitting: Psychology

## 2016-04-17 ENCOUNTER — Ambulatory Visit (INDEPENDENT_AMBULATORY_CARE_PROVIDER_SITE_OTHER): Payer: Medicare Other | Admitting: Psychology

## 2016-04-17 DIAGNOSIS — R413 Other amnesia: Secondary | ICD-10-CM

## 2016-04-17 NOTE — Progress Notes (Signed)
NEUROPSYCHOLOGICAL INTERVIEW (CPT: [id number])  Name: Gary Butler Date of Birth: [birth year] Date of Interview: 04/17/2016  Reason for Referral:  Gary Butler is a 71 y.o. right handed male who is referred for neuropsychological evaluation by Gary Butler of [facility] due to concerns about memory loss. This patient is unaccompanied in the office for today's visit.  History of Presenting Problem:  Gary Butler has been followed by Dr. Delice Butler for memory loss since November 2016. MoCA in July 2017 was 28/30, similar to November 2016; however, he and his wife have continued to report worsening memory. MRI of the brain completed on 01/04/2015 reportedly revealed no acute intracranial abnormality but a moderate degree of nonspecific white matter signal hyperintensities, likely sequela of chronic small vessel ischemic change. He was started on donepezil in July of last year and has been tolerating this well. According to Gary Butler most recent visit note dated 03/05/2016, his wife is having a harder time dealing with his forgetfulness and wonders how she can help him better.  Gary Butler reports gradual onset of memory decline around 2014, with progressive worsening over time. Specific symptoms reported include forgetfulness for recent conversations/events, more frequently misplacing items, starting but not finishing tasks, distractibility, word substitution errors with some word finding difficulty, difficulty retrieving names of people he has known a long time, and more uncertainty about driving directions. Just today he was 30 mins late to his appointment due to going to the wrong doctor's office. He reports that he needs to do more pre-planning, double checking of information and writing things down. He does not think he is repeating statements/questions, he is not missing appointments because he uses a calendar, and he is not missing medications. He denies difficulty concentrating. He denies any language  comprehension difficulties. He is an avid reader. He has not gotten lost when driving.  He has sleep apnea but has worn his CPAP faithfully every night for years. He recently got an updated machine and new supplies.  Family history of dementia was denied.  Psychiatric history was denied. 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   Current Functioning: Gary Butler lives with his wife Gary Butler in their own home. He continues to manage all instrumental ADLs including driving, medications, finances (no missed bills), and appointments.  Physically, he complains of sciatica at the present time, necessitating use of a cane to ambulate. Otherwise, he feels quite well. He has not had any falls. He denies any sleep difficulties. He feels his diabetes is well controlled. He wears hearing aids for hearing loss. He does have tinnitus.  He feels his mood is good, but he notes that his wife would probably say it is easier for him to fly off the handle. He does not think he actually flies off the handle, but he does get "grumpy" more often. He denies any significant depression or anxiety. He has always been an optimistic and jovial person.   Social History: The patient was born in [place] and raised in [place] and [place] area. He and his wife moved to [place] over 40 years ago, after he completed four years in the [employer]. Highest level of education is a master's degree in special education. 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 He has  been married for almost 79 years. He and his wife have a 66yo daughter and they adopted a son who is now 61yo. The patient drinks an occasional scotch but is not a regular alcohol user. He was never a smoker or tobacco user. No recreational drug use.    Medical History: Past Medical History:  Diagnosis Date  . Arthritis   . Constipation   . Diabetes mellitus   . Heart murmur   . History of gout   . History of skin cancer   . Hypertension   . OSA (obstructive sleep apnea)   . Psoriatic arthritis ([place])   . Sleep apnea   . Tinnitus      Current Medications:  Outpatient Encounter Prescriptions as of 04/17/2016  Medication Sig  . allopurinol (ZYLOPRIM) 300 MG tablet TAKE 1 TABLET(300 MG) BY MOUTH DAILY  . aspirin 81 MG tablet Take 81 mg by mouth daily.  Marland Kitchen atenolol (TENORMIN) 50 MG tablet TAKE 1 TABLET BY MOUTH EVERY DAY  . atorvastatin (LIPITOR) 40 MG tablet TAKE 1 TABLET(40 MG) BY MOUTH EVERY EVENING  . Cholecalciferol (VITAMIN D) 2000 UNITS tablet Take 2,000 Units by mouth daily.  . Cyanocobalamin (B-12) 2000 MCG TABS Take by mouth.  . diclofenac (VOLTAREN) 75 MG EC tablet TK 1 T PO BID WF  . donepezil (ARICEPT) 5 MG tablet Take 1/2 tablet daily for 1 month, then increase to 1 tablet daily  . folic acid (FOLVITE) 1 MG tablet Take 1 mg by mouth daily.   . furosemide (LASIX) 20 MG tablet TAKE 1 TABLET(20 MG) BY MOUTH DAILY  . Linaclotide (LINZESS) 145 MCG CAPS capsule Take 145 mcg by mouth. Reported on 03/09/2015  . meloxicam (MOBIC) 15 MG tablet TAKE 1 TABLET(15 MG) BY MOUTH DAILY  . methotrexate (RHEUMATREX) 2.5 MG tablet Take 15 mg by mouth once a week.   Marland Kitchen MYRBETRIQ 25 MG TB24 tablet   . nystatin ointment (MYCOSTATIN) Apply 1 application topically 2 (two) times daily.  . pioglitazone (ACTOS) 30 MG tablet TAKE 1 TABLET BY MOUTH DAILY  . potassium chloride (MICRO-K) 10 MEQ CR capsule TAKE ONE CAPSULE BY MOUTH EVERY DAY  . potassium chloride SA (K-DUR,KLOR-CON) 20 MEQ tablet Take 1  tablet (20 mEq total) by mouth daily.  . ramipril (ALTACE) 5 MG capsule TAKE 1 CAPSULE BY MOUTH DAILY  . sertraline (ZOLOFT) 50 MG tablet TAKE 1 TABLET(50 MG) BY MOUTH DAILY  . tiZANidine (ZANAFLEX) 4 MG tablet Take 1 tablet (4 mg total) by mouth every 6 (six) hours as needed for muscle spasms.  Marland Kitchen triamterene-hydrochlorothiazide (MAXZIDE) 75-50 MG tablet TAKE 1/2 TABLET BY MOUTH EVERY MORNING   No facility-administered encounter medications on file as of 04/17/2016.      Behavioral Observations:   Appearance: Neatly and appropriately dressed and groomed Gait: Ambulated with a cane, stiffness of gait due to pain Speech: Fluent; normal rate, rhythm and volume. Mild word finding difficulty. Thought process: Linear, goal directed Affect: Full, jovial Interpersonal: Very pleasant, friendly, socially appropriate   TESTING: There is medical necessity to proceed with neuropsychological assessment as the results will be used to aid in differential diagnosis and clinical decision-making and to inform specific treatment recommendations. Per the patient,  his wife and medical records reviewed, there has been a change in cognitive functioning and a reasonable suspicion of MCI versus dementia.   PLAN: The patient will return for a full battery of neuropsychological testing with a psychometrician under my supervision. Education regarding testing procedures was provided. Subsequently, the patient will see this provider for a follow-up session at which time his test performances and my impressions and treatment recommendations will be reviewed in detail.   Full neuropsychological evaluation report to follow.

## 2016-04-23 LAB — HM DIABETES EYE EXAM

## 2016-04-24 ENCOUNTER — Ambulatory Visit (INDEPENDENT_AMBULATORY_CARE_PROVIDER_SITE_OTHER): Payer: Medicare Other | Admitting: Physician Assistant

## 2016-04-24 ENCOUNTER — Encounter: Payer: Self-pay | Admitting: Physician Assistant

## 2016-04-24 VITALS — BP 120/62 | HR 59 | Temp 98.0°F | Resp 16 | Ht 71.0 in | Wt 355.0 lb

## 2016-04-24 DIAGNOSIS — J0100 Acute maxillary sinusitis, unspecified: Secondary | ICD-10-CM | POA: Diagnosis not present

## 2016-04-24 MED ORDER — BENZONATATE 100 MG PO CAPS: 100.0000 mg | ORAL_CAPSULE | Freq: Two times a day (BID) | ORAL | 0 refills | Status: DC | PRN

## 2016-04-24 MED ORDER — AZITHROMYCIN 250 MG PO TABS: ORAL_TABLET | ORAL | 0 refills | Status: DC

## 2016-04-24 NOTE — Patient Instructions (Signed)
Please take antibiotic as directed. Call your rheumatologist to see if he wants you to delay methotrexate dosing while on antibiotic.  Increase fluid intake.  Use Saline nasal spray.  Take a daily multivitamin. Use cough medication given as directed.  Place a humidifier in the bedroom.  Please call or return clinic if symptoms are not improving.  Sinusitis Sinusitis is redness, soreness, and swelling (inflammation) of the paranasal sinuses. Paranasal sinuses are air pockets within the bones of your face (beneath the eyes, the middle of the forehead, or above the eyes). In healthy paranasal sinuses, mucus is able to drain out, and air is able to circulate through them by way of your nose. However, when your paranasal sinuses are inflamed, mucus and air can become trapped. This can allow bacteria and other germs to grow and cause infection. Sinusitis can develop quickly and last only a short time (acute) or continue over a long period (chronic). Sinusitis that lasts for more than 12 weeks is considered chronic.  CAUSES  Causes of sinusitis include:  Allergies.  Structural abnormalities, such as displacement of the cartilage that separates your nostrils (deviated septum), which can decrease the air flow through your nose and sinuses and affect sinus drainage.  Functional abnormalities, such as when the small hairs (cilia) that line your sinuses and help remove mucus do not work properly or are not present. SYMPTOMS  Symptoms of acute and chronic sinusitis are the same. The primary symptoms are pain and pressure around the affected sinuses. Other symptoms include:  Upper toothache.  Earache.  Headache.  Bad breath.  Decreased sense of smell and taste.  A cough, which worsens when you are lying flat.  Fatigue.  Fever.  Thick drainage from your nose, which often is green and may contain pus (purulent).  Swelling and warmth over the affected sinuses. DIAGNOSIS  Your caregiver will  perform a physical exam. During the exam, your caregiver may:  Look in your nose for signs of abnormal growths in your nostrils (nasal polyps).  Tap over the affected sinus to check for signs of infection.  View the inside of your sinuses (endoscopy) with a special imaging device with a light attached (endoscope), which is inserted into your sinuses. If your caregiver suspects that you have chronic sinusitis, one or more of the following tests may be recommended:  Allergy tests.  Nasal culture A sample of mucus is taken from your nose and sent to a lab and screened for bacteria.  Nasal cytology A sample of mucus is taken from your nose and examined by your caregiver to determine if your sinusitis is related to an allergy. TREATMENT  Most cases of acute sinusitis are related to a viral infection and will resolve on their own within 10 days. Sometimes medicines are prescribed to help relieve symptoms (pain medicine, decongestants, nasal steroid sprays, or saline sprays).  However, for sinusitis related to a bacterial infection, your caregiver will prescribe antibiotic medicines. These are medicines that will help kill the bacteria causing the infection.  Rarely, sinusitis is caused by a fungal infection. In theses cases, your caregiver will prescribe antifungal medicine. For some cases of chronic sinusitis, surgery is needed. Generally, these are cases in which sinusitis recurs more than 3 times per year, despite other treatments. HOME CARE INSTRUCTIONS   Drink plenty of water. Water helps thin the mucus so your sinuses can drain more easily.  Use a humidifier.  Inhale steam 3 to 4 times a day (for example, sit  in the bathroom with the shower running).  Apply a warm, moist washcloth to your face 3 to 4 times a day, or as directed by your caregiver.  Use saline nasal sprays to help moisten and clean your sinuses.  Take over-the-counter or prescription medicines for pain, discomfort, or  fever only as directed by your caregiver. SEEK IMMEDIATE MEDICAL CARE IF:  You have increasing pain or severe headaches.  You have nausea, vomiting, or drowsiness.  You have swelling around your face.  You have vision problems.  You have a stiff neck.  You have difficulty breathing. MAKE SURE YOU:   Understand these instructions.  Will watch your condition.  Will get help right away if you are not doing well or get worse. Document Released: 01/29/2005 Document Revised: 04/23/2011 Document Reviewed: 02/13/2011 [facility] Patient Information 2014 [employer], [place].

## 2016-04-24 NOTE — Progress Notes (Signed)
Pt presents to clinic today complaining of sinus pressure x 10 days. Pt admits runny nose, cough (productive of white mucus), SOB, tinnitus, and chest congestion. States cough is worse in the morning, and better at night when lying down. Pt has been taking Naproxen 50 mg TID for tooth pain from prior dental procedure. Admits wife as sick contact. Recently traveled on cruise to [place] (3 weeks ago). Denies fever, chills, ear pressure, ear pain, N/V, chest pain, palpitations, diarrhea, constipation.  10 mg -- methotrexate tomorrow.   Past Medical History:  Diagnosis Date  . Arthritis   . Constipation   . Diabetes mellitus   . Heart murmur   . History of gout   . History of skin cancer   . Hypertension   . OSA (obstructive sleep apnea)   . Psoriatic arthritis ([place])   . Sleep apnea   . Tinnitus     Current Outpatient Prescriptions on File Prior to Visit  Medication Sig Dispense Refill  . allopurinol (ZYLOPRIM) 300 MG tablet TAKE 1 TABLET(300 MG) BY MOUTH DAILY 30 tablet 6  . aspirin 81 MG tablet Take 81 mg by mouth daily.    Marland Kitchen atenolol (TENORMIN) 50 MG tablet TAKE 1 TABLET BY MOUTH EVERY DAY 90 tablet 1  . atorvastatin (LIPITOR) 40 MG tablet TAKE 1 TABLET(40 MG) BY MOUTH EVERY EVENING 90 tablet 0  . Cholecalciferol (VITAMIN D) 2000 UNITS tablet Take 2,000 Units by mouth daily.    . Cyanocobalamin (B-12) 2000 MCG TABS Take by mouth.    . diclofenac (VOLTAREN) 75 MG EC tablet TK 1 T PO BID WF  1  . donepezil (ARICEPT) 5 MG tablet Take 1/2 tablet daily for 1 month, then increase to 1 tablet daily 30 tablet 11  . folic acid (FOLVITE) 1 MG tablet Take 1 mg by mouth daily.     . furosemide (LASIX) 20 MG tablet TAKE 1 TABLET(20 MG) BY MOUTH DAILY 30 tablet 6  . Linaclotide (LINZESS) 145 MCG CAPS capsule Take 145 mcg by mouth. Reported on 03/09/2015    . meloxicam (MOBIC) 15 MG tablet TAKE 1 TABLET(15 MG) BY MOUTH DAILY 30 tablet 0  . methotrexate (RHEUMATREX) 2.5 MG tablet Take 15 mg by mouth  once a week.     Marland Kitchen MYRBETRIQ 25 MG TB24 tablet     . nystatin ointment (MYCOSTATIN) Apply 1 application topically 2 (two) times daily. 90 g 1  . pioglitazone (ACTOS) 30 MG tablet TAKE 1 TABLET BY MOUTH DAILY 30 tablet 0  . potassium chloride (MICRO-K) 10 MEQ CR capsule TAKE ONE CAPSULE BY MOUTH EVERY DAY 90 capsule 1  . potassium chloride SA (K-DUR,KLOR-CON) 20 MEQ tablet Take 1 tablet (20 mEq total) by mouth daily. 30 tablet 6  . ramipril (ALTACE) 5 MG capsule TAKE 1 CAPSULE BY MOUTH DAILY 30 capsule 6  . sertraline (ZOLOFT) 50 MG tablet TAKE 1 TABLET(50 MG) BY MOUTH DAILY 90 tablet 1  . tiZANidine (ZANAFLEX) 4 MG tablet Take 1 tablet (4 mg total) by mouth every 6 (six) hours as needed for muscle spasms. 30 tablet 0  . triamterene-hydrochlorothiazide (MAXZIDE) 75-50 MG tablet TAKE 1/2 TABLET BY MOUTH EVERY MORNING 30 tablet 6   No current facility-administered medications on file prior to visit.     Allergies  Allergen Reactions  . Lyrica [Pregabalin] Swelling  . Penicillins Swelling  . Levofloxacin Rash    Family History  Problem Relation Age of Onset  . Emphysema Father   .  Clotting disorder Father   . Arthritis Father   . Heart disease Mother   . Arthritis Mother   . Arthritis Maternal Grandmother   . Hypertension Maternal Grandmother   . Diabetes Maternal Grandmother   . Arthritis Paternal Grandmother   . Diabetes Maternal Aunt   . Diabetes Paternal Aunt     Social History   Social History  . Marital status: Married    Spouse name: N/A  . Number of children: 2  . Years of education: N/A   Occupational History  . retired      still works part-time for [employer] as a Therapist, occupational History Main Topics  . Smoking status: Never Smoker  . Smokeless tobacco: Never Used  . Alcohol use 0.0 oz/week     Comment: Once a month  . Drug use: No  . Sexual activity: No   Other Topics Concern  . None   Social History Narrative   Alcohol- 1 to 2 per month.     Review of Systems - See HPI.  All other ROS are negative.  BP 120/62   Pulse (!) 59   Temp 98 F (36.7 C) (Oral)   Resp 16   Ht 5\' 11"  (1.803 m)   Wt (!) 355 lb (161 kg)   SpO2 94%   BMI 49.51 kg/m   Physical Exam  Constitutional: He is well-developed, well-nourished, and in no distress. No distress.  HENT:  Right Ear: External ear normal. Tympanic membrane is not erythematous. A middle ear effusion (serous - scant) is present.  Left Ear: External ear normal. Tympanic membrane is not erythematous. A middle ear effusion (serous) is present.  Nose: Mucosal edema present. Right sinus exhibits maxillary sinus tenderness. Right sinus exhibits no frontal sinus tenderness. Left sinus exhibits maxillary sinus tenderness. Left sinus exhibits no frontal sinus tenderness.  Mouth/Throat: Oropharynx is clear and moist. No oropharyngeal exudate.  Eyes: Pupils are equal, round, and reactive to light. Right eye exhibits no discharge. Left eye exhibits no discharge. No scleral icterus.  Neck: Neck supple.  Cardiovascular: Normal rate, regular rhythm and normal heart sounds.  Exam reveals no gallop and no friction rub.   No murmur heard. Pulmonary/Chest: Effort normal. No respiratory distress. He has no wheezes. He has no rales.  Lymphadenopathy:       Head (right side): No submental, no submandibular, no tonsillar, no preauricular, no posterior auricular and no occipital adenopathy present.       Head (left side): Submental adenopathy present. No submandibular, no tonsillar, no preauricular, no posterior auricular and no occipital adenopathy present.    He has no cervical adenopathy.  Skin: He is not diaphoretic.   Recent Results (from the past 2160 hour(s))  Hemoglobin A1c     Status: None   Collection Time: 02/20/16 10:52 AM  Result Value Ref Range   Hgb A1c MFr Bld 6.5 4.6 - 6.5 %    Comment: Glycemic Control Guidelines for People with Diabetes:Non Diabetic:  <6%Goal of Therapy:  <7%Additional Action Suggested:  >8%   Lipid panel     Status: None   Collection Time: 02/20/16 10:52 AM  Result Value Ref Range   Cholesterol 97 0 - 200 mg/dL    Comment: ATP III Classification       Desirable:  < 200 mg/dL               Borderline High:  200 - 239 mg/dL  High:  > = 240 mg/dL   Triglycerides 114.0 0.0 - 149.0 mg/dL    Comment: Normal:  <150 mg/dLBorderline High:  150 - 199 mg/dL   HDL 39.20 >39.00 mg/dL   VLDL 22.8 0.0 - 40.0 mg/dL   LDL Cholesterol 35 0 - 99 mg/dL   Total CHOL/HDL Ratio 2     Comment:                Men          Women1/2 Average Risk     3.4          3.3Average Risk          5.0          4.42X Average Risk          9.6          7.13X Average Risk          15.0          11.0                       NonHDL 58.16     Comment: NOTE:  Non-HDL goal should be 30 mg/dL higher than patient's LDL goal (i.e. LDL goal of < 70 mg/dL, would have non-HDL goal of < 100 mg/dL)  Basic metabolic panel     Status: Abnormal   Collection Time: 02/20/16 10:52 AM  Result Value Ref Range   Sodium 139 135 - 145 mEq/L   Potassium 3.4 (L) 3.5 - 5.1 mEq/L   Chloride 98 96 - 112 mEq/L   CO2 34 (H) 19 - 32 mEq/L   Glucose, Bld 196 (H) 70 - 99 mg/dL   BUN 17 6 - 23 mg/dL   Creatinine, Ser 1.06 0.40 - 1.50 mg/dL   Calcium 9.3 8.4 - 10.5 mg/dL   GFR 73.15 >60.00 mL/min  TSH     Status: None   Collection Time: 02/20/16 10:52 AM  Result Value Ref Range   TSH 2.78 0.35 - 4.50 uIU/mL  Hepatic function panel     Status: None   Collection Time: 02/20/16 10:52 AM  Result Value Ref Range   Total Bilirubin 1.1 0.2 - 1.2 mg/dL   Bilirubin, Direct 0.3 0.0 - 0.3 mg/dL   Alkaline Phosphatase 102 39 - 117 U/L   AST 20 0 - 37 U/L   ALT 23 0 - 53 U/L   Total Protein 6.0 6.0 - 8.3 g/dL   Albumin 3.8 3.5 - 5.2 g/dL  CBC with Differential/Platelet     Status: Abnormal   Collection Time: 02/20/16 10:52 AM  Result Value Ref Range   WBC 9.0 4.0 - 10.5 K/uL   RBC 4.49 4.22 - 5.81 Mil/uL    Hemoglobin 14.4 13.0 - 17.0 g/dL   HCT 42.9 39.0 - 52.0 %   MCV 95.5 78.0 - 100.0 fl   MCHC 33.7 30.0 - 36.0 g/dL   RDW 17.3 (H) 11.5 - 15.5 %   Platelets 208.0 150.0 - 400.0 K/uL   Neutrophils Relative % 77.4 (H) 43.0 - 77.0 %   Lymphocytes Relative 11.9 (L) 12.0 - 46.0 %   Monocytes Relative 5.6 3.0 - 12.0 %   Eosinophils Relative 4.4 0.0 - 5.0 %   Basophils Relative 0.7 0.0 - 3.0 %   Neutro Abs 7.0 1.4 - 7.7 K/uL   Lymphs Abs 1.1 0.7 - 4.0 K/uL   Monocytes Absolute 0.5 0.1 - 1.0 K/uL   Eosinophils  Absolute 0.4 0.0 - 0.7 K/uL   Basophils Absolute 0.1 0.0 - 0.1 K/uL  PSA, Medicare     Status: None   Collection Time: 02/20/16 10:52 AM  Result Value Ref Range   PSA 1.07 0.10 - 4.00 ng/ml    Assessment/Plan: 1. Acute non-recurrent maxillary sinusitis Rx Azithromycin due to multiple allergies to ABX and trying to avoid tetracycline giving methotrexate use. Patient has taken Zpack in the past and does very well with these. Rx Tessalon.  Increase fluids.  Rest.  Saline nasal spray.  Probiotic.  Mucinex as directed.  Humidifier in bedroom. He will speak with his specialist regarding this week's methotrexate dosing.  Call or return to clinic if symptoms are not improving.    Leeanne Rio, PA-C

## 2016-04-24 NOTE — Progress Notes (Signed)
Pre visit review using our clinic review tool, if applicable. No additional management support is needed unless otherwise documented below in the visit note. 

## 2016-04-25 ENCOUNTER — Ambulatory Visit (INDEPENDENT_AMBULATORY_CARE_PROVIDER_SITE_OTHER): Payer: Medicare Other | Admitting: Psychology

## 2016-04-25 DIAGNOSIS — R413 Other amnesia: Secondary | ICD-10-CM

## 2016-04-25 NOTE — Progress Notes (Signed)
   Neuropsychology Note  Jalyn Rosero returned today for 2 hours of neuropsychological testing with technician, Milana Kidney, BS, under the supervision of Dr. Macarthur Critchley. The patient did not appear overtly distressed by the testing session, per behavioral observation or via self-report to the technician. Rest breaks were offered. Gary Butler will return within 2 weeks for a feedback session with Dr. Si Raider at which time his test performances, clinical impressions and treatment recommendations will be reviewed in detail. The patient understands he can contact our office should he require our assistance before this time.  Full report to follow.

## 2016-04-30 ENCOUNTER — Telehealth: Payer: Self-pay | Admitting: Family Medicine

## 2016-04-30 NOTE — Telephone Encounter (Signed)
LMOVM advising patient what symptoms has not improved? Has he finished the abx?

## 2016-04-30 NOTE — Telephone Encounter (Signed)
Pt states that the zpac he received has not helped completely and asking for a 2nd one to be called in. Pt states that sometimes it takes two rounds of abx to get rid of all sinus infection. [facility] in [place]

## 2016-05-03 NOTE — Telephone Encounter (Signed)
LMOVM advising of current symptoms and if need additional abx.

## 2016-05-04 NOTE — Telephone Encounter (Signed)
Some of his symptoms has improved He is still getting up sputum with the cough, the Tessalon Pearls is helping with the cough but still not feeling any better with the first Z-pak. Please advise

## 2016-05-05 NOTE — Telephone Encounter (Signed)
I apologize as I was not in office to receive this message before the weekend. Sorry he is still feeling under the weather. If symptoms improved on z-pack, we could repeat course. Other antibiotics are options as well but we have to be careful with choice due to z-pack.  If he notes improvement on the z-pack, we can send in repeat fill.

## 2016-05-07 ENCOUNTER — Encounter: Payer: Self-pay | Admitting: Emergency Medicine

## 2016-05-07 MED ORDER — AZITHROMYCIN 250 MG PO TABS: ORAL_TABLET | ORAL | 0 refills | Status: DC

## 2016-05-07 NOTE — Addendum Note (Signed)
Addended by: Leonidas Romberg on: 05/07/2016 12:29 PM   Modules accepted: Orders

## 2016-05-07 NOTE — Telephone Encounter (Signed)
Pt LMOVM stating that he has been waiting a week for a second round for abx and asking for someone to please send it in and call him and let him knoe this has been done. [facility] in [place]

## 2016-05-07 NOTE — Telephone Encounter (Signed)
Rx was sent to the pharmacy of patient preference.

## 2016-05-07 NOTE — Telephone Encounter (Signed)
Pt calling checking status on this. °

## 2016-05-08 ENCOUNTER — Encounter: Payer: Self-pay | Admitting: General Practice

## 2016-05-14 ENCOUNTER — Other Ambulatory Visit: Payer: Self-pay | Admitting: Family Medicine

## 2016-05-14 NOTE — Progress Notes (Signed)
NEUROPSYCHOLOGICAL EVALUATION   Name:    Gary Butler  Date of Birth:   Jul 08, 1945 Date of Interview:  04/17/2016 Date of Testing:  04/25/2016   Date of Feedback:  05/15/2016       Background Information:  Reason for Referral:  Gary Butler is a 71 y.o. right handed male referred by Dr. Ellouise Newer to assess his current level of cognitive functioning and assist in differential diagnosis. The current evaluation consisted of a review of available medical records, an interview with the patient, and the completion of a neuropsychological testing battery. Informed consent was obtained.  History of Presenting Problem:  [name] has been followed by Dr. Delice Lesch for memory loss since November 2016. MoCA in July 2017 was 28/30, similar to November 2016; however, he and his wife have continued to report worsening memory. MRI of the brain completed on 01/04/2015 reportedly revealed no acute intracranial abnormality but a moderate degree of nonspecific white matter signal hyperintensities, likely sequela of chronic small vessel ischemic change. He was started on donepezil in July of last year and has been tolerating this well. According to Dr. Amparo Bristol most recent visit note dated 03/05/2016, his wife is having a harder time dealing with his forgetfulness and wonders how she can help him better.  [name] reports gradual onset of memory decline around 2014, with progressive worsening over time. Specific symptoms reported include forgetfulness for recent conversations/events, more frequently misplacing items, starting but not finishing tasks, distractibility, word substitution errors with some word finding difficulty, difficulty retrieving names of people he has known a long time, and more uncertainty about driving directions. Just today he was 30 mins late to his appointment due to going to the wrong doctor's office. He reports that he needs to do more pre-planning, double checking of information and writing things  down. He does not think he is repeating statements/questions, he is not missing appointments because he uses a calendar, and he is not missing medications. He denies difficulty concentrating. He denies any language comprehension difficulties. He is an avid reader. He has not gotten lost when driving.  He has sleep apnea but has worn his CPAP faithfully every night for years. He recently got an updated machine and new supplies.  Family history of dementia was denied.  Psychiatric history was denied. 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   Current Functioning: [name] lives with his wife Collie Siad in their own home. He continues to manage all instrumental ADLs including driving, medications, finances (no missed bills), and appointments.  Physically, he complains of sciatica at the present time, necessitating use of a cane to ambulate. Otherwise, he feels quite well. He has not had any falls. He denies any sleep difficulties. He feels his diabetes is well controlled. He wears hearing aids for hearing loss. He does have tinnitus.  He feels his mood is good, but he notes that his wife would probably say it is easier for him to fly off the handle. He does not think he actually flies off the handle, but he does get "grumpy" more often. He denies any significant depression or anxiety. He has always been an optimistic and jovial person.   Social History: The patient was born in [place] and raised in [place]  and [place] area. He and his wife moved to [place] over 40 years ago, after he completed four years in the [employer]. Highest level of education is a master's degree in special education. 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 He has been married for almost 35 years. He and his wife have a 7yo daughter and they adopted a son who is now 65yo. The patient drinks an occasional scotch but is not a regular alcohol drinker. He was never a smoker or tobacco user. No recreational drug use.    Medical History:  Past Medical History:  Diagnosis Date  . Arthritis   . Constipation   . Diabetes mellitus   . Heart murmur   . History of gout   . History of skin cancer   . Hypertension   . OSA (obstructive sleep apnea)   . Psoriatic arthritis ([place])   . Sleep apnea   . Tinnitus     Current medications:  Outpatient Encounter Prescriptions as of 05/15/2016  Medication Sig  . allopurinol (ZYLOPRIM) 300 MG tablet TAKE 1 TABLET(300 MG) BY MOUTH DAILY  . aspirin 81 MG tablet Take 81 mg by mouth daily.  Marland Kitchen atenolol (TENORMIN) 50 MG tablet TAKE 1 TABLET BY MOUTH EVERY DAY  . atorvastatin (LIPITOR) 40 MG tablet TAKE 1 TABLET(40 MG) BY MOUTH EVERY EVENING  . azithromycin (ZITHROMAX) 250 MG tablet Take 2 tablets on Day 1. Then take 1 tablet daily.  . benzonatate (TESSALON) 100 MG capsule Take 1 capsule (100 mg total) by mouth 2 (two) times daily as needed for cough.  . Cholecalciferol (VITAMIN D) 2000 UNITS tablet Take 2,000 Units by mouth daily.  . Cyanocobalamin (B-12) 2000 MCG TABS Take by mouth.  . diclofenac (VOLTAREN) 75 MG EC tablet TK 1 T PO BID WF  . donepezil (ARICEPT) 5 MG tablet Take 1/2 tablet daily for 1 month, then increase to 1 tablet daily  . folic acid (FOLVITE) 1 MG tablet Take 1 mg by mouth daily.   . furosemide (LASIX) 20 MG tablet TAKE 1 TABLET(20 MG) BY MOUTH DAILY  . Linaclotide (LINZESS) 145 MCG CAPS capsule Take 145 mcg by mouth. Reported on 03/09/2015  . meloxicam (MOBIC) 15 MG tablet TAKE 1  TABLET(15 MG) BY MOUTH DAILY  . methotrexate (RHEUMATREX) 2.5 MG tablet Take 15 mg by mouth once a week.   Marland Kitchen MYRBETRIQ 25 MG TB24 tablet   . nystatin ointment (MYCOSTATIN) Apply 1 application topically 2 (two) times daily.  . pioglitazone (ACTOS) 30 MG tablet TAKE 1 TABLET BY MOUTH DAILY  . potassium chloride (MICRO-K) 10 MEQ CR capsule TAKE ONE CAPSULE BY MOUTH EVERY DAY  . potassium chloride SA (K-DUR,KLOR-CON) 20 MEQ tablet Take 1 tablet (20 mEq total) by mouth daily.  . ramipril (ALTACE) 5 MG capsule TAKE 1 CAPSULE BY MOUTH DAILY  . sertraline (ZOLOFT) 50 MG tablet TAKE 1 TABLET(50 MG) BY MOUTH DAILY  . tiZANidine (ZANAFLEX) 4 MG tablet Take 1 tablet (4 mg total) by mouth every 6 (six) hours as needed for muscle spasms.  Marland Kitchen triamterene-hydrochlorothiazide (MAXZIDE) 75-50 MG tablet TAKE 1/2 TABLET BY MOUTH EVERY MORNING   No facility-administered encounter medications on file as of 05/15/2016.  Current Examination:  Behavioral Observations:   Appearance: Neatly and appropriately dressed and groomed Gait: Ambulated with a cane, stiffness of gait due to pain Speech: Fluent; normal rate, rhythm and volume. Mild word finding difficulty. Thought process: Linear, goal directed Affect: Full, jovial Interpersonal: Very pleasant, friendly, socially appropriate Orientation: Oriented to all spheres. Accurately named the current President and his predecessor.   Tests Administered: . Test of Premorbid Functioning (TOPF) . Wechsler Adult Intelligence Scale-Fourth Edition (WAIS-IV): Similarities, Block Design, Matrix Reasoning, Arithmetic, Symbol Search, Coding and Digit Span subtests . Wechsler Memory Scale-Fourth Edition (WMS-IV) Older Adult Version (ages 88-90): Logical Memory I, II and Recognition subtests  . Repeatable Battery for the Assessment of Neuropsychological Status (RBANS) Form A:  Figure Copy and Recall subtests and Semantic Fluency subtest . [place] Verbal Learning Test - 2nd  Edition (CVLT-2) Short Form . Neuropsychological Assessment Battery (NAB) Language Module, Form 1: Naming Subtest . Controlled Oral Word Association Test (COWAT) . Trail Making Test A and B . Clock drawing test . [facility]) . Generalized Anxiety Disorder - 7 item screener (GAD-7) . Beck Depression Inventory - Second edition (BDI-II)   Test Results: Note: Standardized scores are presented only for use by appropriately trained professionals and to allow for any future test-retest comparison. These scores should not be interpreted without consideration of all the information that is contained in the rest of the report. The most recent standardization samples from the test publisher or other sources were used whenever possible to derive standard scores; scores were corrected for age, gender, ethnicity and education when available.   Test Scores:  Test Name Raw Score Standardized Score Descriptor  TOPF 51/70 SS= 108 Average  WAIS-IV Subtests     Similarities 22/36 ss= 9 Average  Block Design 28/66 ss= 9 Average  Matrix Reasoning 10/26 ss= 9 Average  Arithmetic 12/22 ss= 9 Average  Symbol Search 23/60 ss= 10 Average  Coding 43/135 ss= 9 Average  Digit Span Forward 10/16 ss= 10 Average  Digit Span Backward 6/16 ss= 8 Average  WMS-IV Subtests     LM I 34/53 ss= 11 Average  LM II 17/39 ss= 10 Average  LM II Recognition 22/23 Cum %: >75 Above average  RBANS Subtests     Figure Copy 18/20 Z= 0.1 Average  Figure Recall 12/20 Z= -0.1 Average  Semantic Fluency 13/40 Z= -1.3 Low average  CVLT-II Scores     Trial 1 4/9 Z= -1 Low average  Trial 4 8/9 Z= 0.5 Average  Trials 1-4 total 25/36 T= 53 Average  SD Free Recall 8/9 Z= 1.5 Superior  LD Free Recall 6/9 Z= 0.5 Average  LD Cued Recall 6/9 Z= 0 Average  Recognition Discriminability 9/9 hits, 1 false positive Z= 1 High average  Forced Choice Recognition 9/9  WNL  NAB Naming 31/31 T= 57 High average  COWAT-FAS 27 T=  38 Low average  COWAT-Animals 13 T= 38 Low average  Trail Making Test A  46" 1 error T= 47 Average  Trail Making Test B  145" 0 errors T= 45 Average  Clock Drawing   WNL   WCST     Total errors 23 T= 44 Average  Perseverative responses 13 T= 46 Average  Perseverative errors 10 T= 51 Average  Conceptual level responses 36 T= 46 Average  Categories completed 3 >16% WNL  Trials to complete 1st category 13 >16% WNL  Failure to maintain set 0  WNL  GAD-7 1/21  WNL  BDI-II 8/63  WNL      Description of Test Results:  Premorbid verbal intellectual abilities were estimated to have been within the average range based on a test of word reading. Psychomotor processing speed was average. Auditory attention and working memory were average. Visual-spatial construction was average. Language abilities were within normal limits. Specifically, confrontation naming was high average with 100% accuracy, and semantic verbal fluency was low average. With regard to verbal memory, encoding and acquisition of non-contextual information (i.e., word list) was average across four learning trials. After a brief distracter task, free recall was superior (8/9 items recalled). After a delay, free recall was average (6/9 items recalled). He did not recall any additional words with semantic cueing (average). Performance on a yes/no recognition task was high average. On another verbal memory test, encoding and acquisition of contextual auditory information (i.e., short stories) was average. After a delay, free recall was average. Performance on a yes/no recognition task was above average. With regard to non-verbal memory, delayed free recall of visual information was average. Performances on tests measuring executive functioning were within normal limits. Mental flexibility and set-shifting were average on Trails B. Verbal fluency with phonemic search restrictions was low average. Verbal abstract reasoning was average.  Non-verbal abstract reasoning was average. Deductive reasoning and problem solving were average. Performance on a clock drawing task was intact. On self-report questionnaires, the patient's responses were not indicative of clinically significant depression or anxiety at the present time.    Clinical Impressions: Diagnosis deferred. Results of cognitive testing were entirely within normal limits and commensurate with estimated premorbid intellectual abilities. Specifically, performances on tests of processing speed, auditory attention, language, visual spatial skills, learning and memory, and executive functioning were all normal, with most scores falling in the average to high average range. There were no impaired performances on testing. There is no evidence to suggest the presence of a neurocognitive disorder, including MCI or dementia, at this time. There is also no evidence of a primary psychiatric disorder. It is most likely that the patient's subjective cognitive complaints are secondary to normal aging.   Recommendations/Plan: 1. In order to maintain brain health, the patient should engage in safe cardiovascular exercise, mental stimulation and social engagement. Optimal control of vascular risk factors is highly recommended. 2. The patient needs to implement compensatory strategies that can be used in everyday life to enhance cognitive function. 3. These results will serve as a nice baseline for future comparison if needed at any time.    Feedback to Patient: Farid Grigorian and his wife returned for a feedback appointment on 05/15/2016 to review the results of his neuropsychological evaluation with this provider. 35 minutes face-to-face time was spent reviewing his test results, my impressions and my recommendations.  The patient was reassured that his testing results are entirely within normal limits and not indicative of any underlying cognitive disorder or dementia. I spent time reviewing  strategies that can be used in everyday life to enhance cognitive function. The patient's wife shared her frustration with coping with the patient's attention/memory difficulties, and in talking this through more, it seemed clear that part of this is reflective of an adjustment to the two of them being together all the time when they were used to being very busy and driven with their own careers in the past. I think that with increased structure and compensatory strategies, the patient will be able to be more productive and his wife will not have to take on as much  responsibility for reminding him of tasks, etc.  The patient would like to undergo re-evaluation in one year and I think this is reasonable.   Total time spent on this patient's case: [id number] unit for interview with psychologist; [phone] units of testing by psychometrician under psychologist's supervision; [phone] units for medical record review, scoring of neuropsychological tests, interpretation of test results, preparation of this report, and review of results to the patient by psychologist.      Thank you for your referral of Johnta Couts. Please feel free to contact me if you have any questions or concerns regarding this report.

## 2016-05-15 ENCOUNTER — Encounter: Payer: Self-pay | Admitting: Psychology

## 2016-05-15 ENCOUNTER — Ambulatory Visit (INDEPENDENT_AMBULATORY_CARE_PROVIDER_SITE_OTHER): Payer: Medicare Other | Admitting: Psychology

## 2016-05-15 DIAGNOSIS — R413 Other amnesia: Secondary | ICD-10-CM

## 2016-05-15 NOTE — Patient Instructions (Signed)
Clinical Impressions: Diagnosis deferred. Results of cognitive testing were entirely within normal limits and commensurate with estimated premorbid intellectual abilities. Specifically, performances on tests of processing speed, auditory attention, language, visual spatial skills, learning and memory, and executive functioning were all normal, with most scores falling in the average to high average range. There were no impaired performances on testing. There is no evidence to suggest the presence of a neurocognitive disorder, including MCI or dementia, at this time. There is also no evidence of a primary psychiatric disorder. It is most likely that the patient's subjective cognitive complaints are secondary to normal aging.    Strategies to enhance cognitive functioning Attention, concentration, memory encoding and consolidation    . Make a plan and be prepared o If you find that you are more attentive at certain times of the day (i.e., the morning), plan important activities and discussions at that time o Determine which activities take the most time and which are most important, then prioritize your "to do list" based on this information o Break tasks into simpler parts, understand the steps involved before starting o Rehearse the steps mentally or write them down. If you write them down, you can use this as a checklist to check off as you complete them. o Visualize completing the task  . Use external aids  o Write everything down that you do not need to know or work with right now. Don't store extra information in your brain that you don't need right now.  o Use a calendar or planner to make checklists, due dates and "to do" lists. o Use ONLY ONE calendar or planner and look at it frequently o Set alarms for important deadlines or appointments  . Minimize interruptions and distractions  o Find a good work environment, e.g., quiet room with a desk, close curtains, use earplugs, mask sounds  with a fan or white noise machine o Turn off cell phone and/or email alerts during important tasks. In fact, it is helpful to schedule a block of time each day where you limit phone and email interruptions and focus on just the more detailed work you have. o Try to minimize the amount of background noise (i.e., television, music) when engaged in important tasks or conversations with others (note that some individuals find soft background music helpful in minimize distraction, so you may need to experiment with optimal level of noise for specific situations)  . Use active effort = consciously attending to details, closely analyzing o Failures of encoding may reflect failure to attend to one's own actions o Be prepared to work more slowly than you usually do  o When reading, allow time for re-reading sections  o Check your work for errors  . Avoid multitasking o Do not attempt to complete more than one task at one time. Focus on one task until it is completed and then move on to the next one. o Avoid other activities while engaged in important tasks, such as talking on the phone while balancing the checkbook, making a shopping list during a meeting.   . Use self-talk during tasks o Repeat the steps of the activity to yourself as you complete them o Talk to yourself about your progress o This helps you maintain focus on the task and makes it easier to remember completing the task (Similar to "active effort" above)  . Conserve energy o Conserve energy to avoid fatigue and its effects on cognition - Get enough sleep - Pace yourself  and make  sure to take breaks - Be open to receiving help - Exercise for increased energy  . Conversational vigilance = paying attention during a conversation  o Listen actively: focus on the speaker and position yourself so that you can clearly hear the him/her, and have open/relaxed posture  o Eye contact: Maintaining eye contact with the person you are speaking  with may increase the likelihood that important information is properly received  o Ask questions: Ask questions for clarification (e.g., request that the speaker explain something in a different way) or ask for information to be repeated if you become distracted, or if you do not hear or understand something during a conversation  o Paraphrase: Summarize or repeat back important information from a conversation in your own words to facilitate communication and ensure that you have heard correctly and understand

## 2016-05-21 ENCOUNTER — Encounter: Payer: Self-pay | Admitting: Family Medicine

## 2016-05-21 ENCOUNTER — Ambulatory Visit (INDEPENDENT_AMBULATORY_CARE_PROVIDER_SITE_OTHER): Payer: Medicare Other | Admitting: Family Medicine

## 2016-05-21 VITALS — BP 123/64 | HR 69 | Temp 98.2°F | Resp 17 | Ht 71.0 in | Wt 356.5 lb

## 2016-05-21 DIAGNOSIS — E119 Type 2 diabetes mellitus without complications: Secondary | ICD-10-CM

## 2016-05-21 LAB — BASIC METABOLIC PANEL
BUN: 16 mg/dL (ref 6–23)
CO2: 32 mEq/L (ref 19–32)
Calcium: 9.3 mg/dL (ref 8.4–10.5)
Chloride: 98 mEq/L (ref 96–112)
Creatinine, Ser: 1.04 mg/dL (ref 0.40–1.50)
GFR: 74.72 mL/min (ref >60.00)
Glucose, Bld: 178 mg/dL — ABNORMAL HIGH (ref 70–99)
Potassium: 3.7 mEq/L (ref 3.5–5.1)
Sodium: 138 mEq/L (ref 135–145)

## 2016-05-21 LAB — HEMOGLOBIN A1C: Hgb A1c MFr Bld: 6.6 % — ABNORMAL HIGH (ref 4.6–6.5)

## 2016-05-21 NOTE — Progress Notes (Signed)
   Subjective:    Patient ID: Gary Butler, male    DOB: [birth year], 71 y.o.   MRN: [id number]  HPI DM- chronic problem, on Actos w/ hx of good control.  On ACE for renal protection.  UTD on eye exam.  No CP, SOB, HAs, visual changes, edema.  Denies symptomatic lows.  No numbness/tingling of hands/feet.  Seeing podiatry.   Review of Systems For ROS see HPI     Objective:   Physical Exam  Constitutional: He is oriented to person, place, and time. He appears well-developed and well-nourished. No distress.  Morbidly obese  HENT:  Head: Normocephalic and atraumatic.  Eyes: Conjunctivae and EOM are normal. Pupils are equal, round, and reactive to light.  Neck: Normal range of motion. Neck supple. No thyromegaly present.  Cardiovascular: Normal rate, regular rhythm, normal heart sounds and intact distal pulses.   No murmur heard. Pulmonary/Chest: Effort normal and breath sounds normal. No respiratory distress.  Abdominal: Soft. Bowel sounds are normal. He exhibits no distension.  Musculoskeletal: He exhibits no edema.  Lymphadenopathy:    He has no cervical adenopathy.  Neurological: He is alert and oriented to person, place, and time. No cranial nerve deficit.  Skin: Skin is warm and dry.  Psychiatric: He has a normal mood and affect. His behavior is normal.  Vitals reviewed.         Assessment & Plan:

## 2016-05-21 NOTE — Assessment & Plan Note (Signed)
Chronic problem.  On Actos w/ good control.  On ACE for renal protection.  UTD on eye exam.  Foot exam done today.  Check labs.  If still well controlled, will decrease Actos to 15mg  daily.  Pt expressed understanding and is in agreement w/ plan.

## 2016-05-21 NOTE — Progress Notes (Signed)
Pre visit review using our clinic review tool, if applicable. No additional management support is needed unless otherwise documented below in the visit note. 

## 2016-05-21 NOTE — Patient Instructions (Signed)
Follow up in 3-4 months to recheck diabetes, BP, and cholesterol We'll notify you of your lab results and make any changes if needed Continue to work on healthy diet and regular exercise- you can do it! Call with any questions or concerns [place]!!!

## 2016-05-29 ENCOUNTER — Ambulatory Visit: Payer: Medicare Other | Admitting: Neurology

## 2016-06-05 ENCOUNTER — Ambulatory Visit (INDEPENDENT_AMBULATORY_CARE_PROVIDER_SITE_OTHER): Payer: Medicare Other | Admitting: Neurology

## 2016-06-05 ENCOUNTER — Encounter: Payer: Self-pay | Admitting: Neurology

## 2016-06-05 VITALS — BP 126/64 | HR 72 | Resp 16 | Ht 71.0 in | Wt 356.0 lb

## 2016-06-05 DIAGNOSIS — R413 Other amnesia: Secondary | ICD-10-CM | POA: Diagnosis not present

## 2016-06-05 NOTE — Patient Instructions (Addendum)
Stop Donepezil. Continue with recommendations given by Dr. Si Raider. Control of blood pressure, cholesterol, diabetes, as well as physical exercises and brain stimulation exercises are important for brain health. Follow-up in 1 year.

## 2016-06-05 NOTE — Progress Notes (Signed)
NEUROLOGY FOLLOW UP OFFICE NOTE  Gary Butler [id number]  HISTORY OF PRESENT ILLNESS: I had the pleasure of seeing Gary Butler in follow-up in the neurology clinic on 06/09/2016. He is again accompanied by his wife who helps supplement the history today. The patient was last seen 3 months ago for worsening short-term memory. His MOCA score in July 2017 was 28/30 (similar to November 2016), however he and his wife continued to report worsening of memory. He opted to start Donepezil, which he is tolerating without side effects. He underwent Neuropsychological testing which was entirely within normal limits. There were no impaired performances on testing. It was felt that most likely the patient's subjective cognitive complaints were secondary to normal aging. Findings were discussed in the office today. His wife reports that it is frustrating that he would forget appointments at least every 2 weeks. He has started doing Dr. Marcia Brash recommendations and feels they are helping him. He is trying to be more organized. His wife is asking about counseling for herself to deal with age-related changes.   HPI 12/27/2014: This is a very pleasant 71 yo RH man with vascular risk factors including hypertension, hyperlipidemia, diabetes, with worsening short-term memory. He and his wife started noticing memory changes around 2014, he forgets names of people he has known for 40 years, he needs to keep a notepad in his car for reminders, he has word-finding difficulties and says the wrong word sometimes. He has more difficulties multi-tasking. His wife expressed frustration that she has to repeat everything she has told him, he would ask questions about something she had previously explained thoroughly to him. She reports "little details go through his head." Around 3 months ago, he was getting easily angry all the time, even when getting dressed. He was started on Sertraline, and his wife does not notice much of the  aggravation he used to have. There is no family history of dementia. He denies any history of significant head injuries, he rarely drinks alcohol (once a month).  Diagnostic Data: TSH and B12 normal. MRI brain without contrast which showed moderate chronic microvascular disease, no acute changes.   PAST MEDICAL HISTORY: Past Medical History:  Diagnosis Date  . Arthritis   . Constipation   . Diabetes mellitus   . Heart murmur   . History of gout   . History of skin cancer   . Hypertension   . OSA (obstructive sleep apnea)   . Psoriatic arthritis ([place])   . Sleep apnea   . Tinnitus     MEDICATIONS: Current Outpatient Prescriptions on File Prior to Visit  Medication Sig Dispense Refill  . allopurinol (ZYLOPRIM) 300 MG tablet TAKE 1 TABLET(300 MG) BY MOUTH DAILY 30 tablet 6  . aspirin 81 MG tablet Take 81 mg by mouth daily.    Marland Kitchen atenolol (TENORMIN) 50 MG tablet TAKE 1 TABLET BY MOUTH EVERY DAY 90 tablet 1  . atorvastatin (LIPITOR) 40 MG tablet TAKE 1 TABLET(40 MG) BY MOUTH EVERY EVENING 90 tablet 0  . Cholecalciferol (VITAMIN D) 2000 UNITS tablet Take 2,000 Units by mouth daily.    . Cyanocobalamin (B-12) 2000 MCG TABS Take by mouth.    . diclofenac (VOLTAREN) 75 MG EC tablet TK 1 T PO BID WF  1  . donepezil (ARICEPT) 5 MG tablet Take 1/2 tablet daily for 1 month, then increase to 1 tablet daily 30 tablet 11  . folic acid (FOLVITE) 1 MG tablet Take 1 mg by mouth daily.     Marland Kitchen  furosemide (LASIX) 20 MG tablet TAKE 1 TABLET(20 MG) BY MOUTH DAILY 30 tablet 6  . Linaclotide (LINZESS) 145 MCG CAPS capsule Take 145 mcg by mouth. Reported on 03/09/2015    . meloxicam (MOBIC) 15 MG tablet TAKE 1 TABLET(15 MG) BY MOUTH DAILY 30 tablet 0  . methotrexate (RHEUMATREX) 2.5 MG tablet Take 15 mg by mouth once a week.     Marland Kitchen MYRBETRIQ 25 MG TB24 tablet     . nystatin ointment (MYCOSTATIN) Apply 1 application topically 2 (two) times daily. 90 g 1  . pioglitazone (ACTOS) 30 MG tablet TAKE 1 TABLET BY  MOUTH DAILY 30 tablet 3  . potassium chloride (MICRO-K) 10 MEQ CR capsule TAKE ONE CAPSULE BY MOUTH EVERY DAY 90 capsule 1  . potassium chloride SA (K-DUR,KLOR-CON) 20 MEQ tablet Take 1 tablet (20 mEq total) by mouth daily. 30 tablet 6  . ramipril (ALTACE) 5 MG capsule TAKE 1 CAPSULE BY MOUTH DAILY 30 capsule 3  . sertraline (ZOLOFT) 50 MG tablet TAKE 1 TABLET(50 MG) BY MOUTH DAILY 90 tablet 1  . tiZANidine (ZANAFLEX) 4 MG tablet Take 1 tablet (4 mg total) by mouth every 6 (six) hours as needed for muscle spasms. 30 tablet 0  . triamterene-hydrochlorothiazide (MAXZIDE) 75-50 MG tablet TAKE 1/2 TABLET BY MOUTH EVERY MORNING 30 tablet 6   No current facility-administered medications on file prior to visit.     ALLERGIES: Allergies  Allergen Reactions  . Lyrica [Pregabalin] Swelling  . Penicillins Swelling  . Levofloxacin Rash    FAMILY HISTORY: Family History  Problem Relation Age of Onset  . Emphysema Father   . Clotting disorder Father   . Arthritis Father   . Heart disease Mother   . Arthritis Mother   . Arthritis Maternal Grandmother   . Hypertension Maternal Grandmother   . Diabetes Maternal Grandmother   . Arthritis Paternal Grandmother   . Diabetes Maternal Aunt   . Diabetes Paternal Aunt     SOCIAL HISTORY: Social History   Social History  . Marital status: Married    Spouse name: N/A  . Number of children: 2  . Years of education: N/A   Occupational History  . retired      still works part-time for [employer] as a Therapist, occupational History Main Topics  . Smoking status: Never Smoker  . Smokeless tobacco: Never Used  . Alcohol use 0.0 oz/week     Comment: Once a month  . Drug use: No  . Sexual activity: No   Other Topics Concern  . Not on file   Social History Narrative   Alcohol- 1 to 2 per month.     REVIEW OF SYSTEMS: Constitutional: No fevers, chills, or sweats, no generalized fatigue, change in appetite Eyes: No visual changes, double  vision, eye pain Ear, nose and throat: No hearing loss, ear pain, nasal congestion, sore throat Cardiovascular: No chest pain, palpitations Respiratory:  No shortness of breath at rest or with exertion, wheezes GastrointestinaI: No nausea, vomiting, diarrhea, abdominal pain, fecal incontinence Genitourinary:  No dysuria, urinary retention or frequency Musculoskeletal:  No neck pain, back pain Integumentary: No rash, pruritus, skin lesions Neurological: as above Psychiatric: No depression, insomnia, anxiety Endocrine: No palpitations, fatigue, diaphoresis, mood swings, change in appetite, change in weight, increased thirst Hematologic/Lymphatic:  No anemia, purpura, petechiae. Allergic/Immunologic: no itchy/runny eyes, nasal congestion, recent allergic reactions, rashes  PHYSICAL EXAM: Vitals:   06/05/16 0938  BP: 126/64  Pulse: 72  Resp:  16   General: No acute distress Head:  Normocephalic/atraumatic Skin/Extremities: No rash, no edema Neurological Exam: alert and oriented to person, place, and time. No aphasia or dysarthria. Fund of knowledge is appropriate.  Recent and remote memory are intact. Attention and concentration are normal.    Able to name objects and repeat phrases. Cranial nerves: Pupils equal, round. No facial asymmetry. Tongue, uvula, palate midline.  Motor: Moves all extremities symmetrically.  Gait narrow-based and steady, no ataxia.  IMPRESSION: This is a pleasant 71 yo RH man with vascular risk factors including hypertension, hyperlipidemia, diabetes, who presented with worsening short-term memory. MOCA score in July 2017 was 28/30, similar to November 2016, however he and his wife continued to notice worsening memory. He underwent Neuropsychological testing which was entirely within normal limits, subjective cognitive complaints likely due to normal aging. We discussed discontinuing Donepezil. He still gets frustrated with himself with the memory changes, continue  Sertraline. His wife is having a harder time dealing with his forgetfulness and asks about counseling for herself. Continue recommendations for brain health given by Dr. Si Raider. We again discussed the importance of control of vascular risk factors, physical exercise, and brain stimulation exercises for brain health. Continue to monitor driving. He will follow-up in 1 year and knows to call for any changes.   Thank you for allowing me to participate in his care.  Please do not hesitate to call for any questions or concerns.  The duration of this appointment visit was 15 minutes of face-to-face time with the patient.  Greater than 50% of this time was spent in counseling, explanation of diagnosis, planning of further management, and coordination of care.   Ellouise Newer, M.D.   CC: Dr. Birdie Riddle

## 2016-06-08 ENCOUNTER — Telehealth: Payer: Self-pay | Admitting: Neurology

## 2016-06-08 NOTE — Telephone Encounter (Signed)
VM-PT said he requested on my chart a name of a counselor for his wife that he had sent to Dr Delice Lesch and wanted to follow up with that/Dawn

## 2016-06-11 ENCOUNTER — Other Ambulatory Visit: Payer: Self-pay | Admitting: Family Medicine

## 2016-06-18 ENCOUNTER — Other Ambulatory Visit: Payer: Self-pay | Admitting: Family Medicine

## 2016-06-29 ENCOUNTER — Ambulatory Visit (HOSPITAL_BASED_OUTPATIENT_CLINIC_OR_DEPARTMENT_OTHER)
Admission: RE | Admit: 2016-06-29 | Discharge: 2016-06-29 | Disposition: A | Discharge status: Home / Self Care | Payer: Medicare Other | Source: Ambulatory Visit | Attending: Medical | Admitting: Medical

## 2016-06-29 ENCOUNTER — Ambulatory Visit (INDEPENDENT_AMBULATORY_CARE_PROVIDER_SITE_OTHER): Payer: Medicare Other | Admitting: Medical

## 2016-06-29 VITALS — BP 117/64 | HR 57 | Temp 97.6°F | Resp 16 | Ht 71.0 in | Wt 360.2 lb

## 2016-06-29 DIAGNOSIS — R062 Wheezing: Secondary | ICD-10-CM | POA: Diagnosis not present

## 2016-06-29 DIAGNOSIS — J9811 Atelectasis: Secondary | ICD-10-CM | POA: Diagnosis not present

## 2016-06-29 DIAGNOSIS — Z8679 Personal history of other diseases of the circulatory system: Secondary | ICD-10-CM

## 2016-06-29 DIAGNOSIS — R059 Cough, unspecified: Secondary | ICD-10-CM

## 2016-06-29 DIAGNOSIS — R05 Cough: Secondary | ICD-10-CM

## 2016-06-29 LAB — CBC WITH DIFFERENTIAL/PLATELET
Basophils Absolute: 0 10*3/uL (ref 0.0–0.1)
Basophils Relative: 0.7 % (ref 0.0–3.0)
Eosinophils Absolute: 0.3 10*3/uL (ref 0.0–0.7)
Eosinophils Relative: 4.7 % (ref 0.0–5.0)
HCT: 42.8 % (ref 39.0–52.0)
Hemoglobin: 14.4 g/dL (ref 13.0–17.0)
Lymphocytes Relative: 12.2 % (ref 12.0–46.0)
Lymphs Abs: 0.7 10*3/uL (ref 0.7–4.0)
MCHC: 33.8 g/dL (ref 30.0–36.0)
MCV: 94.3 fl (ref 78.0–100.0)
Monocytes Absolute: 0.3 10*3/uL (ref 0.1–1.0)
Monocytes Relative: 5.3 % (ref 3.0–12.0)
Neutro Abs: 4.5 10*3/uL (ref 1.4–7.7)
Neutrophils Relative %: 77.1 % — ABNORMAL HIGH (ref 43.0–77.0)
Platelets: 186 10*3/uL (ref 150.0–400.0)
RBC: 4.53 Mil/uL (ref 4.22–5.81)
RDW: 16 % — ABNORMAL HIGH (ref 11.5–15.5)
WBC: 5.8 10*3/uL (ref 4.0–10.5)

## 2016-06-29 LAB — BRAIN NATRIURETIC PEPTIDE: Pro B Natriuretic peptide (BNP): 93 pg/mL (ref 0.0–100.0)

## 2016-06-29 MED ORDER — AZITHROMYCIN 250 MG PO TABS
ORAL_TABLET | ORAL | 0 refills | Status: DC
Start: 2016-06-29 — End: 2016-09-27

## 2016-06-29 MED ORDER — FLUTICASONE PROPIONATE HFA 110 MCG/ACT IN AERO: 2.0000 | INHALATION_SPRAY | Freq: Two times a day (BID) | RESPIRATORY_TRACT | 1 refills | Status: DC

## 2016-06-29 MED ORDER — BENZONATATE 100 MG PO CAPS: 100.0000 mg | ORAL_CAPSULE | Freq: Three times a day (TID) | ORAL | 0 refills | Status: DC | PRN

## 2016-06-29 NOTE — Patient Instructions (Addendum)
You appear to have bronchitis. Rest hydrate and tylenol for fever. I am prescribing cough medicine benzonatate, and azithromycin antibiotic.   For wheezing use albuterol but if using frequently as discussed then add flovent.  Do want to get cxr to evaluate if early pneumonia and evaluate if any degree of chf.  Please get cbc and bnp today as well.  Follow up in 7-10 days or as needed

## 2016-06-29 NOTE — Progress Notes (Signed)
Subjective:    Patient ID: Gary Butler, male    DOB: 17-Jun-1945, 71 y.o.   MRN: [id number]  HPI  Pt in states sick for one week.  Pt states coughing a lot. Productive cough and some chest congestion. Pt had pneumonia twice in the past. Not a lot of nasal congestion recently No fever, no chills or sweats. Moderate fatigue.   Pt states last 2 days he has been wheezing. More so last night.   Pt states his weight has been stable recently. No swelling in his legs more than usual. Pt does have history of chf in the past.  Pt not on lasix presently.   Review of Systems  Constitutional: Negative for chills, diaphoresis and fatigue.  HENT: Negative for congestion, ear pain, mouth sores, sinus pain and sinus pressure.   Respiratory: Positive for cough and wheezing. Negative for chest tightness and shortness of breath.        Chest congestion.  Cardiovascular: Negative for chest pain and palpitations.  Gastrointestinal: Negative for abdominal pain.  Musculoskeletal: Negative for back pain.  Neurological: Negative for headaches.  Hematological: Negative for adenopathy. Does not bruise/bleed easily.  Psychiatric/Behavioral: Negative for agitation, confusion and dysphoric mood.   Past Medical History:  Diagnosis Date  . Arthritis   . Constipation   . Diabetes mellitus   . Heart murmur   . History of gout   . History of skin cancer   . Hypertension   . OSA (obstructive sleep apnea)   . Psoriatic arthritis ([place])   . Sleep apnea   . Tinnitus      Social History   Social History  . Marital status: Married    Spouse name: N/A  . Number of children: 2  . Years of education: N/A   Occupational History  . retired      still works part-time for [employer] as a Therapist, occupational History Main Topics  . Smoking status: Never Smoker  . Smokeless tobacco: Never Used  . Alcohol use 0.0 oz/week     Comment: Once a month  . Drug use: No  . Sexual activity: No   Other Topics  Concern  . Not on file   Social History Narrative   Alcohol- 1 to 2 per month.     Past Surgical History:  Procedure Laterality Date  . addenoids    . JOINT REPLACEMENT  2012   RT KNEE  . LUNG BIOPSY     20 YRS AGO  . TONSILLECTOMY    . 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 Emphysema Father   . Clotting disorder Father   . Arthritis Father   . Heart disease Mother   . Arthritis Mother   . Arthritis Maternal Grandmother   . Hypertension Maternal Grandmother   . Diabetes Maternal Grandmother   . Arthritis Paternal Grandmother   . Diabetes Maternal Aunt   . Diabetes Paternal Aunt     Allergies  Allergen Reactions  . Lyrica [Pregabalin] Swelling  . Penicillins Swelling  . Levofloxacin Rash    Current Outpatient Prescriptions on File Prior to Visit  Medication Sig Dispense Refill  . allopurinol (ZYLOPRIM) 300 MG tablet TAKE 1 TABLET(300 MG) BY MOUTH DAILY 30 tablet  3  . aspirin 81 MG tablet Take 81 mg by mouth daily.    Marland Kitchen atenolol (TENORMIN) 50 MG tablet TAKE 1 TABLET BY MOUTH EVERY DAY 90 tablet 1  . atorvastatin (LIPITOR) 40 MG tablet TAKE 1 TABLET(40 MG) BY MOUTH EVERY EVENING 90 tablet 0  . Cholecalciferol (VITAMIN D) 2000 UNITS tablet Take 2,000 Units by mouth daily.    . Cyanocobalamin (B-12) 2000 MCG TABS Take by mouth.    . diclofenac (VOLTAREN) 75 MG EC tablet TK 1 T PO BID WF  1  . donepezil (ARICEPT) 5 MG tablet Take 1/2 tablet daily for 1 month, then increase to 1 tablet daily 30 tablet 11  . folic acid (FOLVITE) 1 MG tablet Take 1 mg by mouth daily.     . furosemide (LASIX) 20 MG tablet TAKE 1 TABLET(20 MG) BY MOUTH DAILY 30 tablet 6  . Linaclotide (LINZESS) 145 MCG CAPS capsule Take 145 mcg by  mouth. Reported on 03/09/2015    . meloxicam (MOBIC) 15 MG tablet TAKE 1 TABLET(15 MG) BY MOUTH DAILY 30 tablet 0  . methotrexate (RHEUMATREX) 2.5 MG tablet Take 15 mg by mouth once a week.     Marland Kitchen MYRBETRIQ 25 MG TB24 tablet     . nystatin ointment (MYCOSTATIN) Apply 1 application topically 2 (two) times daily. 90 g 1  . pioglitazone (ACTOS) 30 MG tablet TAKE 1 TABLET BY MOUTH DAILY 30 tablet 3  . potassium chloride (MICRO-K) 10 MEQ CR capsule TAKE ONE CAPSULE BY MOUTH EVERY DAY 90 capsule 1  . potassium chloride SA (K-DUR,KLOR-CON) 20 MEQ tablet Take 1 tablet (20 mEq total) by mouth daily. 30 tablet 6  . ramipril (ALTACE) 5 MG capsule TAKE 1 CAPSULE BY MOUTH DAILY 30 capsule 3  . sertraline (ZOLOFT) 50 MG tablet TAKE 1 TABLET(50 MG) BY MOUTH DAILY 90 tablet 0  . tiZANidine (ZANAFLEX) 4 MG tablet Take 1 tablet (4 mg total) by mouth every 6 (six) hours as needed for muscle spasms. 30 tablet 0  . triamterene-hydrochlorothiazide (MAXZIDE) 75-50 MG tablet TAKE 1/2 TABLET BY MOUTH EVERY MORNING 30 tablet 6   No current facility-administered medications on file prior to visit.     BP 117/64 (BP Location: Right Arm, Patient Position: Sitting, Cuff Size: Large)   Pulse (!) 57   Temp 97.6 F (36.4 C) (Oral)   Resp 16   Ht 5\' 11"  (1.803 m)   Wt (!) 360 lb 3.2 oz (163.4 kg)   SpO2 97%   BMI 50.24 kg/m      Objective:   Physical Exam  General  Mental Status - Alert. General Appearance - Well groomed. Not in acute distress.  Skin Rashes- No Rashes.  HEENT Head- Normal. Ear Auditory Canal - Left- Normal. Right - Normal.Tympanic Membrane- Left- Normal. Right- Normal. Eye Sclera/Conjunctiva- Left- Normal. Right- Normal. Nose & Sinuses Nasal Mucosa- Left-  Boggy and Congested. Right-  Boggy and  Congested.Bilateral maxillary and frontal sinus pressure. Mouth & Throat Lips: Upper Lip- Normal: no dryness, cracking, pallor, cyanosis, or vesicular eruption. Lower Lip-Normal: no dryness,  cracking, pallor, cyanosis or vesicular eruption. Buccal Mucosa- Bilateral- No Aphthous ulcers. Oropharynx- No Discharge or Erythema. Tonsils: Characteristics- Bilateral- No Erythema or Congestion. Size/Enlargement- Bilateral- No enlargement. Discharge- bilateral-None.  Neck Neck- Supple. No Masses.   Chest and Lung Exam Auscultation: Breath Sounds:-Clear even and unlabored.(;aying supine no sob. O2 Sat steady at 98%)  Cardiovascular Auscultation:Rythm- Regular, rate and rhythm. Murmurs & Other Heart Sounds:Ausculatation of the  heart reveal- No Murmurs.  Lymphatic Head & Neck General Head & Neck Lymphatics: Bilateral: Description- No Localized lymphadenopathy.  Lower ext- faint 1+ pedal edema at best. Symmetric calves       Assessment & Plan:  You appear to have bronchitis. Rest hydrate and tylenol for fever. I am prescribing cough medicine benzonatate, and azithromycin antibiotic.   For wheezing use albuterol but if using frequently as discussed then add flovent.  Do want to get cxr to evaluate if early pneumonia and evaluate if any degree of chf.  Please get cbc and bnp today as well.  Follow up in 7-10 days or as needed

## 2016-06-30 ENCOUNTER — Other Ambulatory Visit: Payer: Self-pay | Admitting: Family Medicine

## 2016-07-25 ENCOUNTER — Ambulatory Visit (INDEPENDENT_AMBULATORY_CARE_PROVIDER_SITE_OTHER): Payer: Medicare Other | Admitting: Family Medicine

## 2016-07-25 ENCOUNTER — Encounter: Payer: Self-pay | Admitting: Family Medicine

## 2016-07-25 VITALS — BP 136/88 | HR 61 | Temp 97.8°F | Resp 16 | Ht 71.0 in | Wt 364.0 lb

## 2016-07-25 DIAGNOSIS — T148XXA Other injury of unspecified body region, initial encounter: Secondary | ICD-10-CM

## 2016-07-25 NOTE — Progress Notes (Signed)
Pre visit review using our clinic review tool, if applicable. No additional management support is needed unless otherwise documented below in the visit note. 

## 2016-07-25 NOTE — Patient Instructions (Signed)
Follow up as needed/scheduled Start the Tizanidine for muscle spasm- may cause drowsiness Continue the Meloxicam once daily- take w/ food Heating pad as needed for spasm If no improvement by Friday at lunch, let me know and we'll send in a prednisone taper Call with any questions or concerns Hang in there!!!

## 2016-07-25 NOTE — Progress Notes (Signed)
   Subjective:    Patient ID: Gary Butler, male    DOB: March 16, 1945, 71 y.o.   MRN: [id number]  HPI Pt having R sided chest and abdominal pain and also R shoulder pain radiating into R arm.  sxs started ~2 weeks ago.  This started in the setting of the severe cough due to recent bronchitis.  Has been unable to lie on R side unable to touch certain areas w/o pain.  Difficulty changing positions w/o discomfort.  No fevers.  Cough is improving but not resolved.  Tessalon is helpful.  Some improvement w/ ibuprofen.  Taking Mobic once daily.   Review of Systems For ROS see HPI     Objective:   Physical Exam  Constitutional: He is oriented to person, place, and time. He appears well-developed and well-nourished. No distress.  Morbidly obese  HENT:  Head: Normocephalic and atraumatic.  Abdominal: Soft. Bowel sounds are normal. Tenderness: mild TTP over R ribs and upper abdomen.  Musculoskeletal: He exhibits tenderness (TTP over R trap w/ spasm noted.  TTP over R scapula and R upper arm muscles).  Neurological: He is alert and oriented to person, place, and time.  Skin: Skin is warm and dry.  Psychiatric: He has a normal mood and affect. His behavior is normal. Thought content normal.  Vitals reviewed.         Assessment & Plan:  Muscle strain- new.  Pt has strained R trap, R intercostals from recent coughing.  Start muscle relaxer.  He is already on daily NSAID- told him to avoid taking extra NSAIDs.  Discussed possibility of Prednisone taper but want to hold off due to his diabetes.  If no improvement, will add Pred taper by end of week.

## 2016-07-26 ENCOUNTER — Telehealth: Payer: Self-pay | Admitting: Family Medicine

## 2016-07-26 MED ORDER — TIZANIDINE HCL 4 MG PO TABS: 4.0000 mg | ORAL_TABLET | Freq: Four times a day (QID) | ORAL | 0 refills | Status: DC | PRN

## 2016-07-26 NOTE — Telephone Encounter (Signed)
Medication has been ordered.   LM for patient to let him know.

## 2016-07-26 NOTE — Telephone Encounter (Signed)
Pt states that the Rx for tizanidine was not received by pharmacy and asking that we resend it.

## 2016-07-27 ENCOUNTER — Telehealth: Payer: Self-pay | Admitting: Family Medicine

## 2016-07-27 MED ORDER — PREDNISONE 10 MG (21) PO TBPK
ORAL_TABLET | ORAL | 0 refills | Status: DC
Start: 2016-07-27 — End: 2016-09-27

## 2016-07-27 NOTE — Telephone Encounter (Signed)
Medication filled to pharmacy as requested.  Pt informed.   

## 2016-07-27 NOTE — Telephone Encounter (Signed)
Reviewed PCP noted. Ok to send in prednisone taper pack.

## 2016-07-27 NOTE — Telephone Encounter (Signed)
Please advise? Ok to send in pred taper?

## 2016-07-27 NOTE — Telephone Encounter (Signed)
Pt states that he was to call if he was not feeling any better from 6/13 visit  And Tabori said.(If no improvement by Friday at lunch, let me know and we'll send in a prednisone taper) [facility] in [place]

## 2016-08-09 ENCOUNTER — Other Ambulatory Visit: Payer: Self-pay | Admitting: Family Medicine

## 2016-08-21 ENCOUNTER — Other Ambulatory Visit: Payer: Self-pay | Admitting: Family Medicine

## 2016-08-24 ENCOUNTER — Other Ambulatory Visit: Payer: Self-pay | Admitting: Family Medicine

## 2016-09-10 ENCOUNTER — Other Ambulatory Visit: Payer: Self-pay | Admitting: Neurology

## 2016-09-10 ENCOUNTER — Other Ambulatory Visit: Payer: Self-pay | Admitting: Family Medicine

## 2016-09-10 DIAGNOSIS — R413 Other amnesia: Secondary | ICD-10-CM

## 2016-09-18 ENCOUNTER — Ambulatory Visit: Payer: Medicare Other | Admitting: Family Medicine

## 2016-09-25 ENCOUNTER — Ambulatory Visit: Payer: Medicare Other | Admitting: Family Medicine

## 2016-09-27 ENCOUNTER — Encounter: Payer: Self-pay | Admitting: Family Medicine

## 2016-09-27 ENCOUNTER — Encounter: Payer: Self-pay | Admitting: General Practice

## 2016-09-27 ENCOUNTER — Ambulatory Visit (INDEPENDENT_AMBULATORY_CARE_PROVIDER_SITE_OTHER): Payer: Medicare Other | Admitting: Family Medicine

## 2016-09-27 VITALS — BP 130/82 | HR 109 | Temp 98.1°F | Resp 16 | Ht 71.0 in | Wt 364.5 lb

## 2016-09-27 DIAGNOSIS — I5032 Chronic diastolic (congestive) heart failure: Secondary | ICD-10-CM | POA: Diagnosis not present

## 2016-09-27 DIAGNOSIS — E785 Hyperlipidemia, unspecified: Secondary | ICD-10-CM

## 2016-09-27 DIAGNOSIS — L4050 Arthropathic psoriasis, unspecified: Secondary | ICD-10-CM | POA: Diagnosis not present

## 2016-09-27 DIAGNOSIS — E119 Type 2 diabetes mellitus without complications: Secondary | ICD-10-CM

## 2016-09-27 DIAGNOSIS — I10 Essential (primary) hypertension: Secondary | ICD-10-CM | POA: Diagnosis not present

## 2016-09-27 LAB — CBC WITH DIFFERENTIAL/PLATELET
Basophils Absolute: 0 10*3/uL (ref 0.0–0.1)
Basophils Relative: 0.6 % (ref 0.0–3.0)
Eosinophils Absolute: 0.3 10*3/uL (ref 0.0–0.7)
Eosinophils Relative: 4.9 % (ref 0.0–5.0)
HCT: 45.2 % (ref 39.0–52.0)
Hemoglobin: 15.1 g/dL (ref 13.0–17.0)
Lymphocytes Relative: 13.5 % (ref 12.0–46.0)
Lymphs Abs: 0.9 10*3/uL (ref 0.7–4.0)
MCHC: 33.4 g/dL (ref 30.0–36.0)
MCV: 94.9 fl (ref 78.0–100.0)
Monocytes Absolute: 0.5 10*3/uL (ref 0.1–1.0)
Monocytes Relative: 7.8 % (ref 3.0–12.0)
Neutro Abs: 4.8 10*3/uL (ref 1.4–7.7)
Neutrophils Relative %: 73.2 % (ref 43.0–77.0)
Platelets: 178 10*3/uL (ref 150.0–400.0)
RBC: 4.76 Mil/uL (ref 4.22–5.81)
RDW: 16.5 % — ABNORMAL HIGH (ref 11.5–15.5)
WBC: 6.6 10*3/uL (ref 4.0–10.5)

## 2016-09-27 LAB — BASIC METABOLIC PANEL
BUN: 15 mg/dL (ref 6–23)
CO2: 34 mEq/L — ABNORMAL HIGH (ref 19–32)
Calcium: 9.2 mg/dL (ref 8.4–10.5)
Chloride: 100 mEq/L (ref 96–112)
Creatinine, Ser: 1.01 mg/dL (ref 0.40–1.50)
GFR: 77.21 mL/min (ref >60.00)
Glucose, Bld: 124 mg/dL — ABNORMAL HIGH (ref 70–99)
Potassium: 4.2 mEq/L (ref 3.5–5.1)
Sodium: 139 mEq/L (ref 135–145)

## 2016-09-27 LAB — LIPID PANEL
Cholesterol: 85 mg/dL (ref 0–200)
HDL: 35.9 mg/dL — ABNORMAL LOW (ref >39.00)
LDL Cholesterol: 32 mg/dL (ref 0–99)
NonHDL: 48.89
Total CHOL/HDL Ratio: 2
Triglycerides: 86 mg/dL (ref 0.0–149.0)
VLDL: 17.2 mg/dL (ref 0.0–40.0)

## 2016-09-27 LAB — HEPATIC FUNCTION PANEL
ALT: 17 U/L (ref 0–53)
AST: 18 U/L (ref 0–37)
Albumin: 3.5 g/dL (ref 3.5–5.2)
Alkaline Phosphatase: 90 U/L (ref 39–117)
Bilirubin, Direct: 0.4 mg/dL — ABNORMAL HIGH (ref 0.0–0.3)
Total Bilirubin: 1.5 mg/dL — ABNORMAL HIGH (ref 0.2–1.2)
Total Protein: 5.4 g/dL — ABNORMAL LOW (ref 6.0–8.3)

## 2016-09-27 LAB — TSH: TSH: 3.23 u[IU]/mL (ref 0.35–4.50)

## 2016-09-27 LAB — HEMOGLOBIN A1C: Hgb A1c MFr Bld: 6.9 % — ABNORMAL HIGH (ref 4.6–6.5)

## 2016-09-27 NOTE — Progress Notes (Signed)
   Subjective:    Patient ID: Gary Butler, male    DOB: 03-11-1945, 71 y.o.   MRN: [id number]  HPI DM- chronic problem, on Actos 30mg  daily w/ hx of good control.  UTD on eye exam, foot exam.  On ACE for renal protection.  Denies symptomatic lows.  No numbness/tingling of hands/feet.  HTN- chronic problem, on Atenolol, Lasix, Ramipril, Triamterene HCTZ w/ adequate control.  No CP, HAs, visual changes, edema.  + SOB w/ exertion  Hyperlipidemia- chronic problem, on Lipitor 40mg  daily.  Denies abd pain, N/V.  Psoriatic Arthritis- following w/ Dr Amil Amen.  Needs Methotrexate level   Review of Systems For ROS see HPI     Objective:   Physical Exam  Constitutional: He is oriented to person, place, and time. He appears well-developed and well-nourished. No distress.  Morbidly obese  HENT:  Head: Normocephalic and atraumatic.  Eyes: Pupils are equal, round, and reactive to light. Conjunctivae and EOM are normal.  Neck: Normal range of motion. Neck supple. No thyromegaly present.  Cardiovascular: Normal rate, regular rhythm, normal heart sounds and intact distal pulses.   No murmur heard. Pulmonary/Chest: Effort normal and breath sounds normal. No respiratory distress.  Abdominal: Soft. Bowel sounds are normal. He exhibits no distension.  Musculoskeletal: He exhibits no edema.  Lymphadenopathy:    He has no cervical adenopathy.  Neurological: He is alert and oriented to person, place, and time. No cranial nerve deficit.  Skin: Skin is warm and dry.  Psychiatric: He has a normal mood and affect. His behavior is normal.  Vitals reviewed.         Assessment & Plan:

## 2016-09-27 NOTE — Assessment & Plan Note (Signed)
Following w/ Dr Amil Amen.  Pt reports Rheum needs methotrexate level.  Order placed

## 2016-09-27 NOTE — Assessment & Plan Note (Signed)
Chronic problem.  On Actos w/ hx of good control.  UTD on eye exam, foot exam, and on ACE for renal protection.  Stressed need for healthy diet and regular exercise.  Check labs.  Adjust meds prn

## 2016-09-27 NOTE — Assessment & Plan Note (Signed)
Chronic problem.  Well controlled today.  Asymptomatic w/ exception of SOB which is likely a combination of CHF and morbid obesity.  Discussed need for healthy diet and regular exercise.  Check labs.  No anticipated med changes.

## 2016-09-27 NOTE — Assessment & Plan Note (Signed)
Chronic problem.  Pt had recent ECHO w/ Dr Claiborne Billings.  Results given during today's appt.  Pt appreciative.

## 2016-09-27 NOTE — Patient Instructions (Signed)
Follow up in 3 months to recheck diabetes We'll notify you of your lab results (and fax them to Dr Amil Amen) and make any changes if needed Continue to work on healthy diet and exercise as you are able Call with any questions or concerns Enjoy the rest of your summer!!!

## 2016-09-27 NOTE — Progress Notes (Signed)
Pre visit review using our clinic review tool, if applicable. No additional management support is needed unless otherwise documented below in the visit note. 

## 2016-09-27 NOTE — Assessment & Plan Note (Signed)
Chronic problem.  On Lipitor 40mg  daily w/o difficulty.  Again stressed the need for healthy diet and regular exercise.  Check labs.  Adjust meds prn

## 2016-09-28 ENCOUNTER — Other Ambulatory Visit: Payer: Self-pay | Admitting: Family Medicine

## 2016-09-29 LAB — METHOTREXATE LEVEL: Methotrexate: 0.1 umol/L

## 2016-10-14 ENCOUNTER — Other Ambulatory Visit: Payer: Self-pay | Admitting: Family Medicine

## 2016-11-01 ENCOUNTER — Other Ambulatory Visit: Payer: Self-pay | Admitting: Family Medicine

## 2016-11-01 NOTE — Telephone Encounter (Signed)
Medication filled to pharmacy as requested.   

## 2016-11-15 ENCOUNTER — Other Ambulatory Visit: Payer: Self-pay | Admitting: Neurology

## 2016-11-15 DIAGNOSIS — R413 Other amnesia: Secondary | ICD-10-CM

## 2016-11-19 ENCOUNTER — Telehealth: Payer: Self-pay | Admitting: Neurology

## 2016-11-19 NOTE — Telephone Encounter (Signed)
Patient called regarding his Donepezil medication. He needed a refill, but no refills? Please Advise. Thnaks

## 2016-11-19 NOTE — Telephone Encounter (Signed)
Spoke with pt.  Clarified that per Dr. Amparo Bristol LOV notes he was to stop Domepezil but continue brain exercises given by Dr. Si Raider.  He says that he forgot about stopping but appreciated my call reminding him.  He says that he does have some issues, but "they are age related and I must accept that" he seemed in good spirits when we talked, even noting that tomorrow is his birthday.

## 2016-12-20 ENCOUNTER — Other Ambulatory Visit: Payer: Self-pay | Admitting: Family Medicine

## 2017-01-02 ENCOUNTER — Other Ambulatory Visit: Payer: Self-pay | Admitting: Family Medicine

## 2017-01-08 ENCOUNTER — Other Ambulatory Visit: Payer: Self-pay | Admitting: Family Medicine

## 2017-01-09 ENCOUNTER — Other Ambulatory Visit: Payer: Self-pay | Admitting: Family Medicine

## 2017-01-17 ENCOUNTER — Encounter: Payer: Self-pay | Admitting: Family Medicine

## 2017-01-17 ENCOUNTER — Ambulatory Visit: Payer: Medicare Other | Admitting: Family Medicine

## 2017-01-17 ENCOUNTER — Other Ambulatory Visit: Payer: Self-pay

## 2017-01-17 VITALS — BP 120/78 | HR 80 | Temp 98.0°F | Resp 17 | Ht 71.0 in | Wt 358.1 lb

## 2017-01-17 DIAGNOSIS — J069 Acute upper respiratory infection, unspecified: Secondary | ICD-10-CM

## 2017-01-17 DIAGNOSIS — R0602 Shortness of breath: Secondary | ICD-10-CM

## 2017-01-17 MED ORDER — OMEPRAZOLE 40 MG PO CPDR: 40.0000 mg | DELAYED_RELEASE_CAPSULE | Freq: Every day | ORAL | 3 refills | Status: DC

## 2017-01-17 MED ORDER — FLUTICASONE PROPIONATE 50 MCG/ACT NA SUSP: 2.0000 | Freq: Every day | NASAL | 6 refills | Status: DC

## 2017-01-17 MED ORDER — CETIRIZINE HCL 10 MG PO TABS: 10.0000 mg | ORAL_TABLET | Freq: Every day | ORAL | 11 refills | Status: DC

## 2017-01-17 NOTE — Assessment & Plan Note (Signed)
Deteriorated.  Pt's SOB is worsening- now having sxs w/ minimal exertion and now with eating.  Will need to stop after a few bites to catch his breath and he also develops cough w/ eating.  Due to the cough, will start PPI.  Refer to Pulmonary for complete evaluation.  Get CXR to assess for acute issue.  Reviewed supportive care and red flags that should prompt return.  Pt expressed understanding and is in agreement w/ plan.

## 2017-01-17 NOTE — Patient Instructions (Addendum)
Follow up as needed or as scheduled [facility] at your convenience and get your chest xray We'll call you with your pulmonary appt Start the Zyrtec daily to decrease your congestion (I sent a prescription) Use the Flonase- 2 sprays each nostril daily (I sent a prescription) Start the Omeprazole once daily to decrease acid production and help with your symptoms while eating Call with any questions or concerns Angela Nevin Christmas!!!

## 2017-01-17 NOTE — Progress Notes (Signed)
   Subjective:    Patient ID: Gary Butler, male    DOB: 15-Nov-1945, 71 y.o.   MRN: [id number]  HPI SOB w/ eating- 'i'm really congested'.  'getting progressively harder to breathe'.  Wife reports prior to the congestion, he was unable to eat and breathe.  Will have to stop eating and starts coughing.  Unable to walk w/o SOB.  No SOB at rest.  + multiple sick contacts.  Denies sinus pain/pressure.  + cough- intermittently productive.  Pt's Rheumatology note in September recommended pulmonary evaluation.  Denies CP or pressure.   Review of Systems For ROS see HPI     Objective:   Physical Exam  Constitutional: He is oriented to person, place, and time. He appears well-developed and well-nourished. No distress.  Morbidly obese  HENT:  Head: Normocephalic and atraumatic.  No TTP over sinuses + turbinate edema + PND TMs normal bilaterally  Eyes: Conjunctivae and EOM are normal. Pupils are equal, round, and reactive to light.  Neck: Normal range of motion. Neck supple. No thyromegaly present.  Cardiovascular: Normal rate, regular rhythm, normal heart sounds and intact distal pulses.  No murmur heard. Pulmonary/Chest: Effort normal and breath sounds normal. No respiratory distress. He has no wheezes.  Abdominal: Soft. Bowel sounds are normal. He exhibits no distension.  Musculoskeletal: He exhibits no edema.  Lymphadenopathy:    He has no cervical adenopathy.  Neurological: He is alert and oriented to person, place, and time. No cranial nerve deficit.  Skin: Skin is warm and dry.  Psychiatric: He has a normal mood and affect. His behavior is normal.  Vitals reviewed.         Assessment & Plan:  Viral URI- new.  No evidence of bacterial infxn.  Appears to be viral/allergy combo.  Restart Zyrtec and Flonase.  Reviewed supportive care and red flags that should prompt return.  Pt expressed understanding and is in agreement w/ plan.

## 2017-01-24 ENCOUNTER — Ambulatory Visit (HOSPITAL_BASED_OUTPATIENT_CLINIC_OR_DEPARTMENT_OTHER)
Admission: RE | Admit: 2017-01-24 | Discharge: 2017-01-24 | Disposition: A | Discharge status: Home / Self Care | Payer: Medicare Other | Source: Ambulatory Visit | Attending: Family Medicine | Admitting: Family Medicine

## 2017-01-24 DIAGNOSIS — R918 Other nonspecific abnormal finding of lung field: Secondary | ICD-10-CM | POA: Insufficient documentation

## 2017-01-24 DIAGNOSIS — R0602 Shortness of breath: Secondary | ICD-10-CM | POA: Diagnosis present

## 2017-01-24 DIAGNOSIS — I517 Cardiomegaly: Secondary | ICD-10-CM | POA: Insufficient documentation

## 2017-01-24 DIAGNOSIS — M47814 Spondylosis without myelopathy or radiculopathy, thoracic region: Secondary | ICD-10-CM | POA: Insufficient documentation

## 2017-02-19 ENCOUNTER — Ambulatory Visit: Payer: Medicare Other | Admitting: Internal Medicine

## 2017-02-19 ENCOUNTER — Encounter: Payer: Self-pay | Admitting: Internal Medicine

## 2017-02-19 VITALS — BP 124/74 | HR 77 | Ht 71.5 in | Wt 361.0 lb

## 2017-02-19 DIAGNOSIS — R0600 Dyspnea, unspecified: Secondary | ICD-10-CM | POA: Insufficient documentation

## 2017-02-19 DIAGNOSIS — G4733 Obstructive sleep apnea (adult) (pediatric): Secondary | ICD-10-CM

## 2017-02-19 DIAGNOSIS — R0609 Other forms of dyspnea: Secondary | ICD-10-CM | POA: Diagnosis not present

## 2017-02-19 DIAGNOSIS — I10 Essential (primary) hypertension: Secondary | ICD-10-CM

## 2017-02-19 MED ORDER — TELMISARTAN 40 MG PO TABS: 40.0000 mg | ORAL_TABLET | Freq: Every day | ORAL | 11 refills | Status: DC

## 2017-02-19 NOTE — Assessment & Plan Note (Addendum)
NPSG 1999:  AHI 79/hr Auto 06/2010:  Optimal cpap 15cm  Referred back to Dr Gwenette Greet at the [facility] in [place] as pt is eligible and wants to see Clance - if not able to see then will need new sleep medicine doc in this clinic

## 2017-02-19 NOTE — Progress Notes (Signed)
Subjective:     Patient ID: Gary Butler, male   DOB: 09-03-1945,     MRN: [id number]  HPI  21 yowm never smoker grew up in [place] with MO complicated by OSA last eval by Clance 2016 referred to pulmonary clinic 02/19/2017 by Dr Birdie Riddle for unexplained sob x summer 2018    02/19/2017 1st [facility] office visit/ Lovene Maret   Chief Complaint  Patient presents with  . Pulmonary Consult    Referred by Dr. Birdie Riddle. Pt c/o SOB for the past 6 months. He states he wakes up in the am with SOB and coughing. He states that he coughs up clear to yellow sputum. He states he gets SOB walking just to his mailbox. He also gets SOB when eating and has to stop and take breaks.   retired Firefighter very sedentary with  Baseline = doe x 50 ft slt decline to MB and mild doe on way back same x 4 years and then x summer of 2018 much  more difficult to get up the incline to the house and also sob  with eating / assoc with wt gain of about 40 lb and gagging x 2 months unless uses cpap at hs.  Already tried on ppi s improvent Also limited by knees and carries dx of RA on MTX Also on actos/ ACEi and tenormin x years   No obvious day to day or daytime variability or assoc excess/ purulent sputum or mucus plugs or hemoptysis or cp or chest tightness, subjective wheeze or overt sinus or hb symptoms. No unusual exposure hx or h/o childhood pna/ asthma or knowledge of premature birth.  Sleeping ok on capap and 2 pillows  without nocturnal  or early am exacerbation  of respiratory  c/o's or need for noct saba. Also denies any obvious fluctuation of symptoms with weather or environmental changes or other aggravating or alleviating factors except as outlined above   Current Allergies, Complete Past Medical History, Past Surgical History, Family History, and Social History were reviewed in [employer] record.  ROS  The following are not active complaints unless bolded Hoarseness, sore throat,  dysphagia, dental problems, itching, sneezing,  nasal congestion or discharge of excess mucus or purulent secretions, ear ache,   fever, chills, sweats, unintended wt loss or wt gain, classically pleuritic or exertional cp,  orthopnea pnd or leg swelling, presyncope, palpitations, abdominal pain, anorexia, nausea, vomiting, diarrhea  or change in bowel habits or change in bladder habits, change in stools or change in urine, dysuria, hematuria,  rash, arthralgias, visual complaints, headache, numbness, weakness or ataxia or problems with walking or coordination,  change in mood/affect or memory.        Current Meds  Medication Sig  . allopurinol (ZYLOPRIM) 300 MG tablet TAKE 1 TABLET(300 MG) BY MOUTH DAILY  . aspirin 81 MG tablet Take 81 mg by mouth daily.  Marland Kitchen atenolol (TENORMIN) 50 MG tablet TAKE 1 TABLET BY MOUTH EVERY DAY  . atorvastatin (LIPITOR) 40 MG tablet TAKE 1 TABLET(40 MG) BY MOUTH EVERY EVENING  . cetirizine (ZYRTEC) 10 MG tablet Take 1 tablet (10 mg total) by mouth daily.  . Cholecalciferol (VITAMIN D) 2000 UNITS tablet Take 2,000 Units by mouth daily.  . Cyanocobalamin (B-12) 2000 MCG TABS Take by mouth.  . donepezil (ARICEPT) 5 MG tablet Take 1/2 tablet daily for 1 month, then increase to 1 tablet daily  . etodolac (LODINE) 400 MG tablet TK 1 T PO BID PRN  P  . fluticasone (FLONASE) 50 MCG/ACT nasal spray Place 2 sprays into both nostrils daily.  . folic acid (FOLVITE) 1 MG tablet Take 1 mg by mouth daily.   . furosemide (LASIX) 20 MG tablet TAKE 1 TABLET(20 MG) BY MOUTH DAILY  . meloxicam (MOBIC) 15 MG tablet TAKE 1 TABLET(15 MG) BY MOUTH DAILY  . MYRBETRIQ 25 MG TB24 tablet   . nystatin ointment (MYCOSTATIN) Apply 1 application topically 2 (two) times daily.  Marland Kitchen omeprazole (PRILOSEC) 40 MG capsule Take 1 capsule (40 mg total) by mouth daily. (Patient taking differently: Take 40 mg by mouth daily as needed. )  . pioglitazone (ACTOS) 30 MG tablet TAKE 1 TABLET BY MOUTH DAILY  .  potassium chloride (MICRO-K) 10 MEQ CR capsule TAKE 1 CAPSULE BY MOUTH EVERY DAY  . sertraline (ZOLOFT) 50 MG tablet TAKE 1 TABLET(50 MG) BY MOUTH DAILY  . tiZANidine (ZANAFLEX) 4 MG tablet Take 1 tablet (4 mg total) by mouth every 6 (six) hours as needed for muscle spasms.  Marland Kitchen triamterene-hydrochlorothiazide (MAXZIDE) 75-50 MG tablet TAKE 1/2 TABLET BY MOUTH EVERY MORNING  . UNABLE TO FIND Med Name: CPAP with [facility]  .   ramipril (ALTACE) 5 MG capsule TAKE 1 CAPSULE BY MOUTH DAILY        Review of Systems     Objective:   Physical Exam    amb obese wm  nad    Wt Readings from Last 3 Encounters:  02/19/17 (!) 361 lb (163.7 kg)  01/17/17 (!) 358 lb 2 oz (162.4 kg)  09/27/16 (!) 364 lb 8 oz (165.3 kg)     Vital signs reviewed - Note on arrival 02 sats  94% on RA       HEENT: nl dentition, turbinates bilaterally, and oropharynx. Nl external ear canals without cough reflex - Modified Mallampati Score =  3    NECK :  without JVD/Nodes/TM/ nl carotid upstrokes bilaterally   LUNGS: no acc muscle use,  Nl contour chest which is clear to A and P bilaterally without cough on insp or exp maneuvers   CV:  RRR  no s3 or murmur or increase in P2, and   trace pitting bilateral lower ext edema   ABD:  soft and nontender with nl inspiratory excursion in the supine position. No bruits or organomegaly appreciated, bowel sounds nl  MS:  Nl gait/ ext warm without deformities, calf tenderness, cyanosis or clubbing No obvious joint restrictions   SKIN: warm and dry without lesions    NEURO:  alert, approp, nl sensorium with  no motor or cerebellar deficits apparent.       I personally reviewed images and agree with radiology impression as follows:  CXR:   01/24/17 1. Stable linear scarring at the left lung base. No definite active process. 2. Stable borderline cardiomegaly.    Labs reviewed 02/19/2017 :  Lab Results  Component Value Date   HGB 15.1 09/27/2016   HGB 14.4  06/29/2016   HGB 14.4 02/20/2016   HGB 15.9 07/19/2014   HGB 14.5 06/17/2013   HGB 16.1 05/14/2012      Lab Results  Component Value Date   TSH 3.23 09/27/2016      BNP    Component Value Date/Time   BNP 102.7 (H) 07/14/2015 1019    ProBNP    Component Value Date/Time   PROBNP 93.0 06/29/2016 1242    Assessment:

## 2017-02-19 NOTE — Assessment & Plan Note (Signed)
Body mass index is 49.65 kg/m.  -  trending up Lab Results  Component Value Date   TSH 3.23 09/27/2016     Contributing to gerd risk/ doe/reviewed the need and the process to achieve and maintain neg calorie balance > defer f/u primary care including intermittently monitoring thyroid status

## 2017-02-19 NOTE — Assessment & Plan Note (Signed)
02/19/2017  Walked RA x 2 laps @ 185 ft each stopped due to  Sob no desats  - trial off acei 02/19/2017 >>>   Symptoms are markedly disproportionate to objective findings and not clear this is actually much of a  lung problem but pt does appear to have difficult to sort out respiratory symptoms of unknown origin for which  DDX  = almost all start with A and  include Adherence, Ace Inhibitors, Acid Reflux, Active Sinus Disease, Alpha 1 Antitripsin deficiency, Anxiety masquerading as Airways dz,  ABPA,  Allergy(esp in young), Aspiration (esp in elderly), Adverse effects of meds,  Active smokers, A bunch of PE's (a small clot burden can't cause this syndrome unless there is already severe underlying pulm or vascular dz with poor reserve)  Anemia/thyroid dz plus two Bs  = Bronchiectasis and Beta blocker use..and one C= CHF  ACEi adverse effects at the  top of the usual list of suspects and the only way to rule it out is a trial off > see a/p  .   ? Acid (or non-acid) GERD > always difficult to exclude as up to 75% of pts in some series report no assoc GI/ Heartburn symptoms> rec continue max (24h)  acid suppression   ? Allergy/ asthma > not enough variability to suggest   ? Adverse effects of meds:  Actos/ mtx always a concern in pt with unexplained sob  ? Anemia/ thyroid dz > excluded by recent labs   ? Anxiety > usually at the bottom of this list of usual suspects  but note already on psychotropics - at the very least he suffers from obesity/deconditioning  ? chf > excluded by bnp so low   Will see back in 6 weeks for full pfts off acei in meantime  Total time devoted to counseling  > 50 % of initial 60 min office visit:  review case with pt/ discussion of options/alternatives/ personally creating written customized instructions  in presence of pt  then going over those specific  Instructions directly with the pt including how to use all of the meds but in particular covering each new medication in  detail and the difference between the maintenance= "automatic" meds and the prns using an action plan format for the latter (If this problem/symptom => do that organization reading Left to right).  Please see AVS from this visit for a full list of these instructions which I personally wrote for this pt and  are unique to this visit.  Marland Kitchen

## 2017-02-19 NOTE — Assessment & Plan Note (Signed)
In the best review of chronic cough to date ( NEJM 2016 375 [phone]) ,  ACEi are now felt to cause cough in up to  20% of pts which is a 4 fold increase from previous reports and does not include the variety of non-specific complaints we see in pulmonary clinic in pts on ACEi but previously attributed to another dx like  Copd/asthma and  include PNDS, throat and chest congestion, "bronchitis", unexplained dyspnea and noct "strangling" sensations, and hoarseness, but also  atypical /refractory GERD symptoms like dysphagia and "bad heartburn"   The only way I know  to prove this is not an "ACEi Case" is a trial off ACEi x a minimum of 6 weeks then regroup.   rec micardix 40 mg daily and f/u in 6 weeks to regroup

## 2017-02-19 NOTE — Patient Instructions (Addendum)
Try off altace (ramapril) but no other change in medications   micardis 40 mg one daily and your symptoms should gradually improve   Please schedule a follow up office visit in 6 weeks, call sooner if needed with pfts on return

## 2017-03-11 ENCOUNTER — Other Ambulatory Visit: Payer: Self-pay | Admitting: Family Medicine

## 2017-03-18 ENCOUNTER — Encounter: Payer: Self-pay | Admitting: Physician Assistant

## 2017-03-18 ENCOUNTER — Other Ambulatory Visit: Payer: Self-pay

## 2017-03-18 ENCOUNTER — Ambulatory Visit: Payer: Medicare Other | Admitting: Physician Assistant

## 2017-03-18 VITALS — BP 100/60 | HR 62 | Temp 98.1°F | Resp 16 | Ht 71.0 in | Wt 357.0 lb

## 2017-03-18 DIAGNOSIS — A084 Viral intestinal infection, unspecified: Secondary | ICD-10-CM | POA: Diagnosis not present

## 2017-03-18 LAB — CBC WITH DIFFERENTIAL/PLATELET
Basophils Absolute: 0 10*3/uL (ref 0.0–0.1)
Basophils Relative: 0.6 % (ref 0.0–3.0)
Eosinophils Absolute: 0.2 10*3/uL (ref 0.0–0.7)
Eosinophils Relative: 4.2 % (ref 0.0–5.0)
HCT: 43.9 % (ref 39.0–52.0)
Hemoglobin: 14.5 g/dL (ref 13.0–17.0)
Lymphocytes Relative: 18.2 % (ref 12.0–46.0)
Lymphs Abs: 0.9 10*3/uL (ref 0.7–4.0)
MCHC: 33.1 g/dL (ref 30.0–36.0)
MCV: 95.2 fl (ref 78.0–100.0)
Monocytes Absolute: 0.4 10*3/uL (ref 0.1–1.0)
Monocytes Relative: 8.2 % (ref 3.0–12.0)
Neutro Abs: 3.4 10*3/uL (ref 1.4–7.7)
Neutrophils Relative %: 68.8 % (ref 43.0–77.0)
Platelets: 178 10*3/uL (ref 150.0–400.0)
RBC: 4.61 Mil/uL (ref 4.22–5.81)
RDW: 15.4 % (ref 11.5–15.5)
WBC: 4.9 10*3/uL (ref 4.0–10.5)

## 2017-03-18 LAB — BASIC METABOLIC PANEL
BUN: 13 mg/dL (ref 6–23)
CO2: 30 mEq/L (ref 19–32)
Calcium: 8.4 mg/dL (ref 8.4–10.5)
Chloride: 99 mEq/L (ref 96–112)
Creatinine, Ser: 0.99 mg/dL (ref 0.40–1.50)
GFR: 78.91 mL/min (ref >60.00)
Glucose, Bld: 140 mg/dL — ABNORMAL HIGH (ref 70–99)
Potassium: 3.5 mEq/L (ref 3.5–5.1)
Sodium: 138 mEq/L (ref 135–145)

## 2017-03-18 NOTE — Progress Notes (Signed)
Patient presents to clinic today c/o 3 days of abdominal cramping with nausea, vomiting and diarrhea. Denies any bloody emesis or bloody diarrhea. Notes these symptoms have resolved within the past 24 hours. Still having nausea and fatigue. Is hydrating well and keeping a bland diet. Notes some mild LUQ pain. Denies fever, chills. Denies change to urinary habits. Patient with DM II. Is checking sugar daily and noting them to be running"good".  Past Medical History:  Diagnosis Date  . Arthritis   . Constipation   . Diabetes mellitus   . Heart murmur   . History of gout   . History of skin cancer   . Hypertension   . OSA (obstructive sleep apnea)   . Psoriatic arthritis ([place])   . Sleep apnea   . Tinnitus     Current Outpatient Medications on File Prior to Visit  Medication Sig Dispense Refill  . allopurinol (ZYLOPRIM) 300 MG tablet TAKE 1 TABLET(300 MG) BY MOUTH DAILY 30 tablet 3  . aspirin 81 MG tablet Take 81 mg by mouth daily.    Marland Kitchen atenolol (TENORMIN) 50 MG tablet TAKE 1 TABLET BY MOUTH EVERY DAY 90 tablet 0  . atorvastatin (LIPITOR) 40 MG tablet TAKE 1 TABLET(40 MG) BY MOUTH EVERY EVENING 90 tablet 0  . cetirizine (ZYRTEC) 10 MG tablet Take 1 tablet (10 mg total) by mouth daily. 30 tablet 11  . Cholecalciferol (VITAMIN D) 2000 UNITS tablet Take 2,000 Units by mouth daily.    . Cyanocobalamin (B-12) 2000 MCG TABS Take by mouth.    . donepezil (ARICEPT) 5 MG tablet Take 1/2 tablet daily for 1 month, then increase to 1 tablet daily 30 tablet 11  . etodolac (LODINE) 400 MG tablet TK 1 T PO BID PRN P  1  . fluticasone (FLONASE) 50 MCG/ACT nasal spray Place 2 sprays into both nostrils daily. 16 g 6  . folic acid (FOLVITE) 1 MG tablet Take 1 mg by mouth daily.     . furosemide (LASIX) 20 MG tablet TAKE 1 TABLET(20 MG) BY MOUTH DAILY 30 tablet 6  . meloxicam (MOBIC) 15 MG tablet TAKE 1 TABLET(15 MG) BY MOUTH DAILY 30 tablet 0  . methotrexate (RHEUMATREX) 2.5 MG tablet Take 15 mg by  mouth once a week.     Marland Kitchen MYRBETRIQ 25 MG TB24 tablet     . nystatin ointment (MYCOSTATIN) Apply 1 application topically 2 (two) times daily. 90 g 1  . omeprazole (PRILOSEC) 40 MG capsule Take 1 capsule (40 mg total) by mouth daily. (Patient taking differently: Take 40 mg by mouth daily as needed. ) 30 capsule 3  . pioglitazone (ACTOS) 30 MG tablet TAKE 1 TABLET BY MOUTH DAILY 90 tablet 3  . potassium chloride (MICRO-K) 10 MEQ CR capsule TAKE 1 CAPSULE BY MOUTH EVERY DAY 90 capsule 0  . sertraline (ZOLOFT) 50 MG tablet TAKE 1 TABLET(50 MG) BY MOUTH DAILY 90 tablet 0  . telmisartan (MICARDIS) 40 MG tablet Take 1 tablet (40 mg total) by mouth daily. 30 tablet 11  . tiZANidine (ZANAFLEX) 4 MG tablet Take 1 tablet (4 mg total) by mouth every 6 (six) hours as needed for muscle spasms. 30 tablet 0  . triamterene-hydrochlorothiazide (MAXZIDE) 75-50 MG tablet TAKE 1/2 TABLET BY MOUTH EVERY MORNING 90 tablet 0  . UNABLE TO FIND Med Name: CPAP with [facility]     No current facility-administered medications on file prior to visit.     Allergies  Allergen Reactions  . Lyrica [  Pregabalin] Swelling  . Penicillins Swelling  . Levofloxacin Rash    Family History  Problem Relation Age of Onset  . Emphysema Father   . Clotting disorder Father   . Arthritis Father   . Heart disease Mother   . Arthritis Mother   . Arthritis Maternal Grandmother   . Hypertension Maternal Grandmother   . Diabetes Maternal Grandmother   . Arthritis Paternal Grandmother   . Diabetes Maternal Aunt   . Diabetes Paternal Aunt     Social History   Socioeconomic History  . Marital status: Married    Spouse name: None  . Number of children: 2  . Years of education: None  . Highest education level: None  Social Needs  . Financial resource strain: None  . Food insecurity - worry: None  . Food insecurity - inability: None  . Transportation needs - medical: None  . Transportation needs - non-medical: None  Occupational  History  . 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 Smoking status: Never Smoker  . Smokeless tobacco: Never Used  Substance and Sexual Activity  . Alcohol use: Yes    Alcohol/week: 0.0 oz    Comment: Once a month  . Drug use: No  . Sexual activity: No  Other Topics Concern  . None  Social History Narrative   Alcohol- 1 to 2 per month.    Review of Systems - See HPI.  All other ROS are negative.  BP 100/60   Pulse 62   Temp 98.1 F (36.7 C) (Oral)   Resp 16   Ht 5\' 11"  (1.803 m)   Wt (!) 357 lb (161.9 kg)   SpO2 95%   BMI 49.79 kg/m   Physical Exam  Constitutional: He is oriented to person, place, and time and well-developed, well-nourished, and in no distress.  HENT:  Head: Normocephalic and atraumatic.  Eyes: Conjunctivae are normal.  Neck: Neck supple.  Cardiovascular: Normal rate, regular rhythm, normal heart sounds and intact distal pulses.  Pulmonary/Chest: Effort normal and breath sounds normal. No respiratory distress. He has no wheezes. He has no rales. He exhibits no tenderness.  Abdominal: Soft. He exhibits no distension and no mass. Bowel sounds are hyperactive. There is generalized tenderness. There is no rebound and no guarding.  Neurological: He is alert and oriented to person, place, and time.  Skin: Skin is warm and dry. No rash noted.  Psychiatric: Affect normal.  Vitals reviewed.  Assessment/Plan: 1. Viral gastroenteritis Seems clinically hydrated. Symptoms resolving. Patient with history of GERD and is taking PPI on PRN basis. Giving mild gastritis for viral infection, he is to start daily and limit NSAID use. BRAT diet reviewed. Continue gentle hydration. Will check labs today. - CBC w/Diff - Basic metabolic panel   Leeanne Rio, PA-C

## 2017-03-18 NOTE — Patient Instructions (Addendum)
Please stay well-hydrated and get plenty of rest.  Continue bland diet --see dietary handout below.  Take the omeprazole 40 mg daily over the next week.  Stop the Melocxicam for a few days.   Follow-up if there is any recurrence of symptoms.

## 2017-03-19 ENCOUNTER — Telehealth: Payer: Self-pay | Admitting: Family Medicine

## 2017-03-19 NOTE — Telephone Encounter (Signed)
Results  Were  Already in   Result   Notes  Pool     -   Results  Were  Given  And  Message  Was  Routed  To practice   Pt stated  He was  Feeling  Good

## 2017-03-19 NOTE — Telephone Encounter (Signed)
Copied from [place] [id number]. Topic: Quick Communication - See Telephone Encounter >> Mar 19, 2017 11:47 AM Ahmed Prima L wrote: CRM for notification. See Telephone encounter for:   03/19/17. Patient missed call from office for labs. PEC line was busy. Please call back [phone]

## 2017-04-02 ENCOUNTER — Other Ambulatory Visit: Payer: Self-pay | Admitting: Family Medicine

## 2017-04-04 ENCOUNTER — Ambulatory Visit: Payer: Medicare Other | Admitting: Internal Medicine

## 2017-04-15 ENCOUNTER — Ambulatory Visit (INDEPENDENT_AMBULATORY_CARE_PROVIDER_SITE_OTHER): Payer: Medicare Other | Admitting: Internal Medicine

## 2017-04-15 ENCOUNTER — Ambulatory Visit: Payer: Medicare Other | Admitting: Internal Medicine

## 2017-04-15 ENCOUNTER — Encounter: Payer: Self-pay | Admitting: Internal Medicine

## 2017-04-15 VITALS — BP 124/64 | HR 71 | Ht 71.0 in | Wt 363.0 lb

## 2017-04-15 DIAGNOSIS — I10 Essential (primary) hypertension: Secondary | ICD-10-CM

## 2017-04-15 DIAGNOSIS — R0609 Other forms of dyspnea: Secondary | ICD-10-CM

## 2017-04-15 DIAGNOSIS — R0600 Dyspnea, unspecified: Secondary | ICD-10-CM

## 2017-04-15 LAB — PULMONARY FUNCTION TEST
DL/VA % pred: 73 %
DL/VA: 3.41 ml/min/mmHg/L
DLCO cor % pred: 55 %
DLCO cor: 18.77 ml/min/mmHg
DLCO unc % pred: 55 %
DLCO unc: 18.72 ml/min/mmHg
FEF 25-75 Post: 1.4 L/sec
FEF 25-75 Pre: 1.56 L/sec
FEF2575-%Change-Post: -10 %
FEF2575-%Pred-Post: 57 %
FEF2575-%Pred-Pre: 63 %
FEV1-%Change-Post: 4 %
FEV1-%Pred-Post: 61 %
FEV1-%Pred-Pre: 58 %
FEV1-Post: 2.02 L
FEV1-Pre: 1.94 L
FEV1FVC-%Change-Post: 6 %
FEV1FVC-%Pred-Pre: 86 %
FEV6-%Change-Post: 0 %
FEV6-%Pred-Post: 68 %
FEV6-%Pred-Pre: 69 %
FEV6-Post: 2.93 L
FEV6-Pre: 2.94 L
FEV6FVC-%Pred-Post: 106 %
FEV6FVC-%Pred-Pre: 106 %
FVC-%Change-Post: -1 %
FVC-%Pred-Post: 66 %
FVC-%Pred-Pre: 67 %
FVC-Post: 3 L
FVC-Pre: 3.06 L
Post FEV1/FVC ratio: 67 %
Post FEV6/FVC ratio: 100 %
Pre FEV1/FVC ratio: 63 %
Pre FEV6/FVC Ratio: 100 %
RV % pred: 96 %
RV: 2.45 L
TLC % pred: 85 %
TLC: 6.18 L

## 2017-04-15 MED ORDER — BISOPROLOL FUMARATE 5 MG PO TABS: 5.0000 mg | ORAL_TABLET | Freq: Every day | ORAL | 11 refills | Status: DC

## 2017-04-15 NOTE — Patient Instructions (Addendum)
Stop tenormin  Start Bisoprolol 5 mg daily in place of tenormin  May need a substitute for actos if breathing problems continue.   Strongly recommend you return to water aerobics    Please schedule a follow up visit in 3 months but call sooner if needed

## 2017-04-15 NOTE — Progress Notes (Addendum)
Subjective:     Patient ID: Gary Butler, male   DOB: 1945/11/01,     MRN: [id number]    Brief patient profile:  47 yowm never smoker grew up in [place] with Or[place] complicated by OSA last eval by [facility] 2016 referred to pulmonary clinic 02/19/2017 by Dr Birdie Riddle for unexplained sob x summer 2018.    History of Present Illness  02/19/2017 1st [facility] office visit/ Debborah Alonge   Chief Complaint  Patient presents with  . Pulmonary Consult    Referred by Dr. Birdie Riddle. Pt c/o SOB for the past 6 months. He states he wakes up in the am with SOB and coughing. He states that he coughs up clear to yellow sputum. He states he gets SOB walking just to his mailbox. He also gets SOB when eating and has to stop and take breaks.   retired Firefighter very sedentary with  Baseline = doe x 50 ft slt decline to MB and mild doe on way back same x 4 years and then x summer of 2018 much  more difficult to get up the incline to the house and also sob  with eating / assoc with wt gain of about 40 lb and gagging x 2 months unless uses cpap at hs.  Already tried on ppi s improvent Also limited by knees and carries dx of RA on MTX Also on actos/ ACEi and tenormin x years  rec Try off altace (ramapril) but no other change in medications  micardis 40 mg one daily and your symptoms should gradually improve Please schedule a follow up office visit in 6 weeks, call sooner if needed with pfts on return      04/15/2017  f/u ov/Marsh Heckler re: MO ? RA bronchiolitis?  Chief Complaint  Patient presents with  . Follow-up    PFT's done. His breathing is unchanged. No new co's.   Dyspnea:  Same severity walking to mb and back  Cough: 1st thing x 30 min p clearing mucus mostly clear  Sleep: ok cpap SABA use:  None  Gagging less of problem, still sometimes sob eating  But not choking on food off acei   No obvious day to day or daytime variability or assoc excess/ purulent sputum or mucus plugs or hemoptysis or cp or chest  tightness, subjective wheeze or overt sinus or hb symptoms. No unusual exposure hx or h/o childhood pna/ asthma or knowledge of premature birth.  Sleeping ok on cpap without nocturnal  or early am exacerbation  of respiratory  c/o's or need for noct saba. Also denies any obvious fluctuation of symptoms with weather or environmental changes or other aggravating or alleviating factors except as outlined above   Current Allergies, Complete Past Medical History, Past Surgical History, Family History, and Social History were reviewed in [employer] record.  ROS  The following are not active complaints unless bolded Hoarseness, sore throat, dysphagia, dental problems, itching, sneezing,  nasal congestion or discharge of excess mucus or purulent secretions, ear ache,   fever, chills, sweats, unintended wt loss or wt gain, classically pleuritic or exertional cp,  orthopnea pnd or leg swelling, presyncope, palpitations, abdominal pain, anorexia, nausea, vomiting, diarrhea  or change in bowel habits or change in bladder habits, change in stools or change in urine, dysuria, hematuria,  rash, arthralgias, visual complaints, headache, numbness, weakness or ataxia or problems with walking or coordination,  change in mood/affect or memory.        Current Meds  Medication Sig  . allopurinol (ZYLOPRIM) 300 MG tablet TAKE 1 TABLET(300 MG) BY MOUTH DAILY  . aspirin 81 MG tablet Take 81 mg by mouth daily.  Marland Kitchen atorvastatin (LIPITOR) 40 MG tablet TAKE 1 TABLET(40 MG) BY MOUTH EVERY EVENING  . cetirizine (ZYRTEC) 10 MG tablet Take 1 tablet (10 mg total) by mouth daily.  . Cholecalciferol (VITAMIN D) 2000 UNITS tablet Take 2,000 Units by mouth daily.  . Cyanocobalamin (B-12) 2000 MCG TABS Take by mouth.  . donepezil (ARICEPT) 5 MG tablet Take 1/2 tablet daily for 1 month, then increase to 1 tablet daily  . etodolac (LODINE) 400 MG tablet TK 1 T PO BID PRN P  . fluticasone (FLONASE) 50 MCG/ACT nasal  spray Place 2 sprays into both nostrils daily.  . folic acid (FOLVITE) 1 MG tablet Take 1 mg by mouth daily.   . furosemide (LASIX) 20 MG tablet TAKE 1 TABLET(20 MG) BY MOUTH DAILY  . meloxicam (MOBIC) 15 MG tablet TAKE 1 TABLET(15 MG) BY MOUTH DAILY  . methotrexate (RHEUMATREX) 2.5 MG tablet Take 15 mg by mouth once a week.   Marland Kitchen MYRBETRIQ 25 MG TB24 tablet   . nystatin ointment (MYCOSTATIN) Apply 1 application topically 2 (two) times daily. (Patient taking differently: Apply 1 application topically 2 (two) times daily as needed. )  . omeprazole (PRILOSEC) 40 MG capsule Take 1 capsule (40 mg total) by mouth daily. (Patient taking differently: Take 40 mg by mouth daily as needed. )  . pioglitazone (ACTOS) 30 MG tablet TAKE 1 TABLET BY MOUTH DAILY  . potassium chloride (MICRO-K) 10 MEQ CR capsule TAKE 1 CAPSULE BY MOUTH EVERY DAY  . sertraline (ZOLOFT) 50 MG tablet TAKE 1 TABLET(50 MG) BY MOUTH DAILY  . telmisartan (MICARDIS) 40 MG tablet Take 1 tablet (40 mg total) by mouth daily.  Marland Kitchen tiZANidine (ZANAFLEX) 4 MG tablet Take 1 tablet (4 mg total) by mouth every 6 (six) hours as needed for muscle spasms.  Marland Kitchen triamterene-hydrochlorothiazide (MAXZIDE) 75-50 MG tablet TAKE 1/2 TABLET BY MOUTH EVERY MORNING  . UNABLE TO FIND Med Name: CPAP with [facility]  . [ atenolol (TENORMIN) 50 MG tablet TAKE 1 TABLET BY MOUTH EVERY DAY             Objective:   Physical Exam  amb obese pleasant wm nad    04/15/2017            363   02/19/17 (!) 361 lb (163.7 kg)  01/17/17 (!) 358 lb 2 oz (162.4 kg)  09/27/16 (!) 364 lb 8 oz (165.3 kg)    Vital signs reviewed - Note on arrival 02 sats  95% on RA        HEENT: nl dentition, turbinates bilaterally, and oropharynx. Nl external ear canals without cough reflex Modified Mallampati Score =   3  NECK :  without JVD/Nodes/TM/ nl carotid upstrokes bilaterally   LUNGS: no acc muscle use,  Nl contour chest which is clear to A and P bilaterally without cough on insp  or exp maneuvers   CV:  RRR  no s3 or murmur or increase in P2, and trace bilateral pedal edema  ABD:  Massively obese with limited  inspiratory excursion in the supine position. No bruits or organomegaly appreciated, bowel sounds nl  MS:  Nl gait/ ext warm without deformities, calf tenderness, cyanosis or clubbing No obvious joint restrictions   SKIN: warm and dry without lesions    NEURO:  alert, approp, nl sensorium with  no motor or cerebellar deficits apparent.           Assessment:

## 2017-04-15 NOTE — Progress Notes (Signed)
PFT done today. 

## 2017-04-16 ENCOUNTER — Encounter: Payer: Self-pay | Admitting: Internal Medicine

## 2017-04-16 ENCOUNTER — Other Ambulatory Visit: Payer: Self-pay | Admitting: Family Medicine

## 2017-04-16 NOTE — Assessment & Plan Note (Addendum)
Body mass index is 50.63 kg/m.  -  trending up still  Lab Results  Component Value Date   TSH 3.23 09/27/2016     Contributing to gerd risk/ doe/ very low ERV/reviewed the need and the process to achieve and maintain neg calorie balance > defer f/u primary care including intermittently monitoring thyroid status

## 2017-04-16 NOTE — Assessment & Plan Note (Signed)
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   Try bisprolol 5 mg daily

## 2017-04-16 NOTE — Assessment & Plan Note (Signed)
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 Could he have resp bronchiolitis / airflow obst on this basis  2) ? Could this be related to use of tenormin > see hbp  3) ? Could some of his sob / edema be related to Actos > would consider trial off next  I had an extended discussion with the patient reviewing all relevant studies completed to date and  lasting 15 to 20 minutes of a 25 minute visit    Each maintenance medication was reviewed in detail including most importantly the difference between maintenance and prns and under what circumstances the prns are to be triggered using an action plan format that is not reflected in the computer generated alphabetically organized AVS.    Please see AVS for specific instructions unique to this visit that I personally wrote and verbalized to the the pt in detail and then reviewed with pt  by my nurse highlighting any  changes in therapy recommended at today's visit to their plan of care.

## 2017-04-18 ENCOUNTER — Other Ambulatory Visit: Payer: Self-pay | Admitting: Family Medicine

## 2017-04-18 NOTE — Telephone Encounter (Signed)
Please advise, per the chart it says Dr. Melvyn Novas canceled this on 04/15/17

## 2017-04-20 ENCOUNTER — Other Ambulatory Visit: Payer: Self-pay | Admitting: Family Medicine

## 2017-04-23 ENCOUNTER — Other Ambulatory Visit: Payer: Self-pay | Admitting: Family Medicine

## 2017-04-23 ENCOUNTER — Other Ambulatory Visit: Payer: Self-pay | Admitting: General Practice

## 2017-04-23 MED ORDER — OMEPRAZOLE 40 MG PO CPDR: 40.0000 mg | DELAYED_RELEASE_CAPSULE | Freq: Every day | ORAL | 1 refills | Status: DC

## 2017-04-23 NOTE — Telephone Encounter (Signed)
Please advise if okay to refill? I do not see this medication on current medication list. Thank you!

## 2017-04-26 LAB — HM DIABETES EYE EXAM

## 2017-05-03 ENCOUNTER — Telehealth: Payer: Self-pay | Admitting: Family Medicine

## 2017-05-03 MED ORDER — FUROSEMIDE 20 MG PO TABS: 20.0000 mg | ORAL_TABLET | Freq: Every day | ORAL | 3 refills | Status: DC

## 2017-05-03 NOTE — Telephone Encounter (Signed)
Copied from [place] [id number]. Topic: General - Other >> May 03, 2017 12:27 PM Darl Householder, RMA wrote: Reason for CRM: Patient is requesting a call back concerning medication furosemide (LASIX) 20 MG tablet pt wants to know if he needs to continue taking this medication

## 2017-05-03 NOTE — Telephone Encounter (Signed)
Patient was needing refill sent in.   Medication sent in for him.

## 2017-05-07 ENCOUNTER — Encounter: Payer: Self-pay | Admitting: General Practice

## 2017-05-15 ENCOUNTER — Other Ambulatory Visit: Payer: Self-pay

## 2017-05-15 ENCOUNTER — Telehealth: Payer: Self-pay | Admitting: Family Medicine

## 2017-05-15 ENCOUNTER — Encounter: Payer: Self-pay | Admitting: Physician Assistant

## 2017-05-15 ENCOUNTER — Ambulatory Visit: Payer: Medicare Other | Admitting: Family Medicine

## 2017-05-15 ENCOUNTER — Ambulatory Visit: Payer: Medicare Other | Admitting: Physician Assistant

## 2017-05-15 VITALS — BP 110/60 | HR 68 | Temp 98.6°F | Resp 16 | Ht 71.0 in | Wt 359.0 lb

## 2017-05-15 DIAGNOSIS — J208 Acute bronchitis due to other specified organisms: Secondary | ICD-10-CM

## 2017-05-15 DIAGNOSIS — B9689 Other specified bacterial agents as the cause of diseases classified elsewhere: Secondary | ICD-10-CM

## 2017-05-15 DIAGNOSIS — Z0289 Encounter for other administrative examinations: Secondary | ICD-10-CM

## 2017-05-15 MED ORDER — AZITHROMYCIN 250 MG PO TABS: ORAL_TABLET | ORAL | 0 refills | Status: DC

## 2017-05-15 MED ORDER — BENZONATATE 100 MG PO CAPS: 100.0000 mg | ORAL_CAPSULE | Freq: Two times a day (BID) | ORAL | 0 refills | Status: DC | PRN

## 2017-05-15 NOTE — Telephone Encounter (Signed)
Will need office visit in order to get medication- productive cough and body aches may be flu (we are still seeing this).  We need to make sure we are treating appropriately

## 2017-05-15 NOTE — Telephone Encounter (Signed)
Called pt, he advised that he is currently having severe nasal drainage, productive cough, and body aches. He says that he is worried because he feels as though it is settling in his chest. He has seen Dr. Melvyn Novas lately due to breathing issues. I asked pt if he would like to come in today to have PCP listen to his airway to make sure he did not need a breathing treatment. He declined asking if he could just have medication sent in for him first.   Please advise?

## 2017-05-15 NOTE — Progress Notes (Signed)
Patient presents to clinic today c/o 1 week of progressively worsening cough and chest congestion, now with productive cough of thick yellow sputum. Notes chest tightness and wheezing. Denies fever, chills but notes fatigue. Is noting SOB with exertion just since cold started. Denies chest pain. Denies recent travel but is around children so notes multiple sick contacts. Has taken OTC Zyrtec and cold meds. Has used rescue inhaler twice in the past 48 hours.   Past Medical History:  Diagnosis Date  . Arthritis   . Constipation   . Diabetes mellitus   . Heart murmur   . History of gout   . History of skin cancer   . Hypertension   . OSA (obstructive sleep apnea)   . Psoriatic arthritis ([place])   . Sleep apnea   . Tinnitus     Current Outpatient Medications on File Prior to Visit  Medication Sig Dispense Refill  . allopurinol (ZYLOPRIM) 300 MG tablet TAKE 1 TABLET(300 MG) BY MOUTH DAILY 30 tablet 3  . aspirin 81 MG tablet Take 81 mg by mouth daily.    Marland Kitchen atorvastatin (LIPITOR) 40 MG tablet TAKE 1 TABLET(40 MG) BY MOUTH EVERY EVENING 90 tablet 0  . bisoprolol (ZEBETA) 5 MG tablet Take 1 tablet (5 mg total) by mouth daily. 30 tablet 11  . cetirizine (ZYRTEC) 10 MG tablet Take 1 tablet (10 mg total) by mouth daily. 30 tablet 11  . Cholecalciferol (VITAMIN D) 2000 UNITS tablet Take 2,000 Units by mouth daily.    . Cyanocobalamin (B-12) 2000 MCG TABS Take by mouth.    . donepezil (ARICEPT) 5 MG tablet Take 1/2 tablet daily for 1 month, then increase to 1 tablet daily 30 tablet 11  . etodolac (LODINE) 400 MG tablet TK 1 T PO BID PRN P  1  . fluticasone (FLONASE) 50 MCG/ACT nasal spray Place 2 sprays into both nostrils daily. 16 g 6  . folic acid (FOLVITE) 1 MG tablet Take 1 mg by mouth daily.     . furosemide (LASIX) 20 MG tablet Take 1 tablet (20 mg total) by mouth daily. 90 tablet 3  . meloxicam (MOBIC) 15 MG tablet TAKE 1 TABLET(15 MG) BY MOUTH DAILY 30 tablet 0  . methotrexate  (RHEUMATREX) 2.5 MG tablet Take 15 mg by mouth once a week.     Marland Kitchen MYRBETRIQ 25 MG TB24 tablet     . nystatin ointment (MYCOSTATIN) Apply 1 application topically 2 (two) times daily. (Patient taking differently: Apply 1 application topically 2 (two) times daily as needed. ) 90 g 1  . omeprazole (PRILOSEC) 40 MG capsule Take 1 capsule (40 mg total) by mouth daily. 90 capsule 1  . pioglitazone (ACTOS) 30 MG tablet TAKE 1 TABLET BY MOUTH DAILY 90 tablet 3  . potassium chloride (MICRO-K) 10 MEQ CR capsule TAKE 1 CAPSULE BY MOUTH EVERY DAY 90 capsule 0  . sertraline (ZOLOFT) 50 MG tablet TAKE 1 TABLET(50 MG) BY MOUTH DAILY 90 tablet 0  . telmisartan (MICARDIS) 40 MG tablet Take 1 tablet (40 mg total) by mouth daily. 30 tablet 11  . tiZANidine (ZANAFLEX) 4 MG tablet Take 1 tablet (4 mg total) by mouth every 6 (six) hours as needed for muscle spasms. 30 tablet 0  . triamterene-hydrochlorothiazide (MAXZIDE) 75-50 MG tablet TAKE 1/2 TABLET BY MOUTH EVERY MORNING 30 tablet 0  . UNABLE TO FIND Med Name: CPAP with [facility]     No current facility-administered medications on file prior to visit.  Allergies  Allergen Reactions  . Lyrica [Pregabalin] Swelling  . Penicillins Swelling  . Levofloxacin Rash    Family History  Problem Relation Age of Onset  . Emphysema Father   . Clotting disorder Father   . Arthritis Father   . Heart disease Mother   . Arthritis Mother   . Arthritis Maternal Grandmother   . Hypertension Maternal Grandmother   . Diabetes Maternal Grandmother   . Arthritis Paternal Grandmother   . Diabetes Maternal Aunt   . Diabetes Paternal Aunt     Social History   Socioeconomic History  . Marital status: Married    Spouse name: Not on file  . Number of children: 2  . Years of education: Not on file  . Highest education level: Not on file  Occupational History  . 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  Financial resource strain: Not on file  . Food insecurity:    Worry: Not on file    Inability: Not on file  . Transportation needs:    Medical: Not on file    Non-medical: Not on file  Tobacco Use  . Smoking status: Never Smoker  . Smokeless tobacco: Never Used  Substance and Sexual Activity  . Alcohol use: Yes    Alcohol/week: 0.0 oz    Comment: Once a month  . Drug use: No  . Sexual activity: Never  Lifestyle  . Physical activity:    Days per week: Not on file    Minutes per session: Not on file  . Stress: Not on file  Relationships  . Social connections:    Talks on phone: Not on file    Gets together: Not on file    Attends religious service: Not on file    Active member of club or organization: Not on file    Attends meetings of clubs or organizations: Not on file    Relationship status: Not on file  Other Topics Concern  . Not on file  Social History Narrative   Alcohol- 1 to 2 per month.    Review of Systems - See HPI.  All other ROS are negative.  BP 110/60   Pulse 68   Temp 98.6 F (37 C) (Oral)   Resp 16   Ht 5\' 11"  (1.803 m)   Wt (!) 359 lb (162.8 kg)   SpO2 92%   BMI 50.07 kg/m   Physical Exam  Constitutional: He is oriented to person, place, and time and well-developed, well-nourished, and in no distress.  HENT:  Head: Normocephalic and atraumatic.  Eyes: Conjunctivae are normal.  Cardiovascular: Normal rate, regular rhythm, normal heart sounds and intact distal pulses.  Pulmonary/Chest: Effort normal and breath sounds normal. No respiratory distress. He has no wheezes. He has no rales. He exhibits no tenderness.  Neurological: He is alert and oriented to person, place, and time.  Skin: Skin is warm and dry. No rash noted.  Psychiatric: Affect normal.  Vitals reviewed.   Recent Results (from the past 2160 hour(s))  CBC w/Diff     Status: None   Collection Time: 03/18/17 12:20 PM  Result Value Ref Range   WBC 4.9 4.0 - 10.5 K/uL   RBC 4.61  4.22 - 5.81 Mil/uL   Hemoglobin 14.5 13.0 - 17.0 g/dL   HCT 43.9 39.0 - 52.0 %   MCV 95.2 78.0 - 100.0 fl   MCHC 33.1 30.0 -  36.0 g/dL   RDW 15.4 11.5 - 15.5 %   Platelets 178.0 150.0 - 400.0 K/uL   Neutrophils Relative % 68.8 43.0 - 77.0 %   Lymphocytes Relative 18.2 12.0 - 46.0 %   Monocytes Relative 8.2 3.0 - 12.0 %   Eosinophils Relative 4.2 0.0 - 5.0 %   Basophils Relative 0.6 0.0 - 3.0 %   Neutro Abs 3.4 1.4 - 7.7 K/uL   Lymphs Abs 0.9 0.7 - 4.0 K/uL   Monocytes Absolute 0.4 0.1 - 1.0 K/uL   Eosinophils Absolute 0.2 0.0 - 0.7 K/uL   Basophils Absolute 0.0 0.0 - 0.1 K/uL  Basic metabolic panel     Status: Abnormal   Collection Time: 03/18/17 12:20 PM  Result Value Ref Range   Sodium 138 135 - 145 mEq/L   Potassium 3.5 3.5 - 5.1 mEq/L   Chloride 99 96 - 112 mEq/L   CO2 30 19 - 32 mEq/L   Glucose, Bld 140 (H) 70 - 99 mg/dL   BUN 13 6 - 23 mg/dL   Creatinine, Ser 0.99 0.40 - 1.50 mg/dL   Calcium 8.4 8.4 - 10.5 mg/dL   GFR 78.91 >60.00 mL/min  Pulmonary function test     Status: None   Collection Time: 04/15/17 11:11 AM  Result Value Ref Range   FVC-Pre 3.06 L   FVC-%Pred-Pre 67 %   FVC-Post 3.00 L   FVC-%Pred-Post 66 %   FVC-%Change-Post -1 %   FEV1-Pre 1.94 L   FEV1-%Pred-Pre 58 %   FEV1-Post 2.02 L   FEV1-%Pred-Post 61 %   FEV1-%Change-Post 4 %   FEV6-Pre 2.94 L   FEV6-%Pred-Pre 69 %   FEV6-Post 2.93 L   FEV6-%Pred-Post 68 %   FEV6-%Change-Post 0 %   Pre FEV1/FVC ratio 63 %   FEV1FVC-%Pred-Pre 86 %   Post FEV1/FVC ratio 67 %   FEV1FVC-%Change-Post 6 %   Pre FEV6/FVC Ratio 100 %   FEV6FVC-%Pred-Pre 106 %   Post FEV6/FVC ratio 100 %   FEV6FVC-%Pred-Post 106 %   FEF 25-75 Pre 1.56 L/sec   FEF2575-%Pred-Pre 63 %   FEF 25-75 Post 1.40 L/sec   FEF2575-%Pred-Post 57 %   FEF2575-%Change-Post -10 %   RV 2.45 L   RV % pred 96 %   TLC 6.18 L   TLC % pred 85 %   DLCO unc 18.72 ml/min/mmHg   DLCO unc % pred 55 %   DLCO cor 18.77 ml/min/mmHg   DLCO cor % pred  55 %   DL/VA 3.41 ml/min/mmHg/L   DL/VA % pred 73 %  HM DIABETES EYE EXAM     Status: None   Collection Time: 04/26/17 12:00 AM  Result Value Ref Range   HM Diabetic Eye Exam No Retinopathy No Retinopathy    Assessment/Plan: 1. Acute bacterial bronchitis Rx Azithromycin and Tessalon.  Increase fluids.  Rest.  Saline nasal spray.  Probiotic.  Mucinex as directed.  Humidifier in bedroom.  Call or return to clinic if symptoms are not improving.  - azithromycin (ZITHROMAX) 250 MG tablet; Take 2 tablets on Day 1. Then take 1 tablet daily.  Dispense: 6 tablet; Refill: 0 - benzonatate (TESSALON) 100 MG capsule; Take 1 capsule (100 mg total) by mouth 2 (two) times daily as needed for cough.  Dispense: 20 capsule; Refill: 0   Leeanne Rio, PA-C

## 2017-05-15 NOTE — Telephone Encounter (Signed)
Copied from [place] [id number]. Topic: Inquiry >> May 15, 2017  8:44 AM Margot Ables wrote: Reason for CRM: pt has a really bad cold and 2 x in the past this developed into pneumonia. He states he took 2 zpacks back to back. He is requesting to do this again. Please advise.  [facility] [place], [place] - [address] AT [address] [phone] (Phone) [phone] (Fax)

## 2017-05-15 NOTE — Telephone Encounter (Signed)
Spoke with patient. He is agreeable to come in to be seen.  Scheduled appointment for 2:15 today with Dr. Birdie Riddle.

## 2017-05-15 NOTE — Patient Instructions (Signed)
Take antibiotic (Azithromycin) as directed.  Increase fluids.  Get plenty of rest. Use Mucinex for congestion. Tessalon for cough. Take a daily probiotic (I recommend Align or Culturelle, but even Activia Yogurt may be beneficial).  A humidifier placed in the bedroom may offer some relief for a dry, scratchy throat of nasal irritation.  Read information below on acute bronchitis. Please call or return to clinic if symptoms are not improving.  Acute Bronchitis Bronchitis is when the airways that extend from the windpipe into the lungs get red, puffy, and painful (inflamed). Bronchitis often causes thick spit (mucus) to develop. This leads to a cough. A cough is the most common symptom of bronchitis. In acute bronchitis, the condition usually begins suddenly and goes away over time (usually in 2 weeks). Smoking, allergies, and asthma can make bronchitis worse. Repeated episodes of bronchitis may cause more lung problems.  HOME CARE  Rest.  Drink enough fluids to keep your pee (urine) clear or pale yellow (unless you need to limit fluids as told by your doctor).  Only take over-the-counter or prescription medicines as told by your doctor.  Avoid smoking and secondhand smoke. These can make bronchitis worse. If you are a smoker, think about using nicotine gum or skin patches. Quitting smoking will help your lungs heal faster.  Reduce the chance of getting bronchitis again by:  Washing your hands often.  Avoiding people with cold symptoms.  Trying not to touch your hands to your mouth, nose, or eyes.  Follow up with your doctor as told.  GET HELP IF: Your symptoms do not improve after 1 week of treatment. Symptoms include:  Cough.  Fever.  Coughing up thick spit.  Body aches.  Chest congestion.  Chills.  Shortness of breath.  Sore throat.  GET HELP RIGHT AWAY IF:   You have an increased fever.  You have chills.  You have severe shortness of breath.  You have bloody  thick spit (sputum).  You throw up (vomit) often.  You lose too much body fluid (dehydration).  You have a severe headache.  You faint.  MAKE SURE YOU:   Understand these instructions.  Will watch your condition.  Will get help right away if you are not doing well or get worse. Document Released: 07/18/2007 Document Revised: 10/01/2012 Document Reviewed: 07/22/2012 [facility] Patient Information 2015 [place], [place]. This information is not intended to replace advice given to you by your health care provider. Make sure you discuss any questions you have with your health care provider.

## 2017-06-17 ENCOUNTER — Other Ambulatory Visit: Payer: Self-pay

## 2017-06-17 ENCOUNTER — Other Ambulatory Visit: Payer: Self-pay | Admitting: Family Medicine

## 2017-06-17 ENCOUNTER — Ambulatory Visit: Payer: Medicare Other | Admitting: Family Medicine

## 2017-06-17 ENCOUNTER — Encounter: Payer: Self-pay | Admitting: Family Medicine

## 2017-06-17 VITALS — BP 118/82 | HR 66 | Temp 98.2°F | Resp 17 | Ht 71.0 in | Wt 362.4 lb

## 2017-06-17 DIAGNOSIS — B9689 Other specified bacterial agents as the cause of diseases classified elsewhere: Secondary | ICD-10-CM | POA: Diagnosis not present

## 2017-06-17 DIAGNOSIS — J329 Chronic sinusitis, unspecified: Secondary | ICD-10-CM | POA: Diagnosis not present

## 2017-06-17 MED ORDER — DOXYCYCLINE HYCLATE 100 MG PO TABS: 100.0000 mg | ORAL_TABLET | Freq: Two times a day (BID) | ORAL | 0 refills | Status: DC

## 2017-06-17 NOTE — Progress Notes (Signed)
   Subjective:    Patient ID: Gary Butler, male    DOB: 1946-01-06, 72 y.o.   MRN: [id number]  HPI URI- 'this is a cycle, i've been going through this for years'.  Pt reports he typically requires 2 Zpacks to improve his sxs.  Pt was seen 4/3 and tx'd w/ Zpack for bronchitis.  He reports sxs initially improved but productive cough has returned.  No fevers.  + SOB.  + sinus congestion.  + HA.  Bilateral ear pain and muffled hearing.   Review of Systems For ROS see HPI     Objective:   Physical Exam  Constitutional: He appears well-developed and well-nourished. No distress.  HENT:  Head: Normocephalic and atraumatic.  Right Ear: Tympanic membrane normal.  Left Ear: Tympanic membrane normal.  Nose: Mucosal edema and rhinorrhea present. Right sinus exhibits maxillary sinus tenderness and frontal sinus tenderness. Left sinus exhibits maxillary sinus tenderness and frontal sinus tenderness.  Mouth/Throat: Mucous membranes are normal. Oropharyngeal exudate and posterior oropharyngeal erythema present. No posterior oropharyngeal edema.  + PND  Eyes: Pupils are equal, round, and reactive to light. Conjunctivae and EOM are normal.  Neck: Normal range of motion. Neck supple.  Cardiovascular: Normal rate, regular rhythm and normal heart sounds.  Pulmonary/Chest: Effort normal and breath sounds normal. No respiratory distress. He has no wheezes.  + hacking cough  Lymphadenopathy:    He has no cervical adenopathy.  Skin: Skin is warm and dry.  Vitals reviewed.         Assessment & Plan:  Sinusitis- new.  Pt's sxs and PE consistent w/ infxn.  Start abx but not Zpack due to high risk of ineffectiveness.  Start Doxy.  Reviewed supportive care and red flags that should prompt return.  Pt expressed understanding and is in agreement w/ plan.

## 2017-06-17 NOTE — Patient Instructions (Signed)
Follow up as needed or as scheduled START the Doxycycline twice daily- take w/ food Use OTC Robitussin or Delsym as needed for cough REST! Call with any questions or concerns Hang in there!!!

## 2017-07-11 ENCOUNTER — Other Ambulatory Visit: Payer: Self-pay | Admitting: Family Medicine

## 2017-07-12 ENCOUNTER — Other Ambulatory Visit: Payer: Self-pay | Admitting: Family Medicine

## 2017-07-16 ENCOUNTER — Ambulatory Visit: Payer: Medicare Other | Admitting: Internal Medicine

## 2017-07-16 ENCOUNTER — Encounter: Payer: Self-pay | Admitting: Internal Medicine

## 2017-07-16 DIAGNOSIS — I10 Essential (primary) hypertension: Secondary | ICD-10-CM

## 2017-07-16 DIAGNOSIS — R0609 Other forms of dyspnea: Secondary | ICD-10-CM

## 2017-07-16 DIAGNOSIS — G4733 Obstructive sleep apnea (adult) (pediatric): Secondary | ICD-10-CM

## 2017-07-16 DIAGNOSIS — R0600 Dyspnea, unspecified: Secondary | ICD-10-CM

## 2017-07-16 NOTE — Progress Notes (Signed)
Subjective:     Patient ID: Gary Butler, male   DOB: [birth year],     MRN: [id number]    Brief patient profile:  31 yowm never smoker grew up in [place] with [place] complicated by OSA last eval by Clance 2016 referred to pulmonary clinic 02/19/2017 by Dr Birdie Riddle for unexplained sob x summer 2018.    History of Present Illness  02/19/2017 1st [facility] office visit/ Evalin Shawhan   Chief Complaint  Patient presents with  . Pulmonary Consult    Referred by Dr. Birdie Riddle. Pt c/o SOB for the past 6 months. He states he wakes up in the am with SOB and coughing. He states that he coughs up clear to yellow sputum. He states he gets SOB walking just to his mailbox. He also gets SOB when eating and has to stop and take breaks.   retired Firefighter very sedentary with  Baseline = doe x 50 ft slt decline to MB and mild doe on way back same x 4 years and then x summer of 2018 much  more difficult to get up the incline to the house and also sob  with eating / assoc with wt gain of about 40 lb and gagging x 2 months unless uses cpap at hs.  Already tried on ppi s improvent Also limited by knees and carries dx of RA on MTX Also on actos/ ACEi and tenormin x years  rec Try off altace (ramapril) but no other change in medications  micardis 40 mg one daily and your symptoms should gradually improve Please schedule a follow up office visit in 6 weeks, call sooner if needed with pfts on return      04/15/2017  f/u ov/Joann Jorge re: MO ? RA bronchiolitis?  Chief Complaint  Patient presents with  . Follow-up    PFT's done. His breathing is unchanged. No new co's.   Dyspnea:  Same severity walking to mb and back  Cough: 1st thing x 30 min p clearing mucus mostly clear  Sleep: ok cpap SABA use:  None  Gagging less of problem, still sometimes sob eating  But not choking on food off acei rec Stop tenormin Start Bisoprolol 5 mg daily in place of tenormin May need a substitute for actos if breathing problems continue.   Strongly recommend you return to water aerobics     07/16/2017  f/u ov/Lynn Sissel re:  MO/ sob  Chief Complaint  Patient presents with  . Follow-up    Breathing has improved alot but not quite back to his normal baseline.    Dyspnea:  mb and back is easier now  Cough: none Sleep: on cpap fine  SABA use:  None    No obvious day to day or daytime variability or assoc excess/ purulent sputum or mucus plugs or hemoptysis or cp or chest tightness, subjective wheeze or overt sinus or hb symptoms. No unusual exposure hx or h/o childhood pna/ asthma or knowledge of premature birth.  Sleeping  On cpap  without nocturnal  or early am exacerbation  of respiratory  c/o's or need for noct saba. Also denies any obvious fluctuation of symptoms with weather or environmental changes or other aggravating or alleviating factors except as outlined above   Current Allergies, Complete Past Medical History, Past Surgical History, Family History, and Social History were reviewed in [employer] record.  ROS  The following are not active complaints unless bolded Hoarseness, sore throat, dysphagia, dental problems, itching, sneezing,  nasal  congestion or discharge of excess mucus or purulent secretions, ear ache,   fever, chills, sweats, unintended wt loss or wt gain, classically pleuritic or exertional cp,  orthopnea pnd or arm/hand swelling  or leg swelling, presyncope, palpitations, abdominal pain, anorexia, nausea, vomiting, diarrhea  or change in bowel habits or change in bladder habits, change in stools or change in urine, dysuria, hematuria,  rash, arthralgias, visual complaints, headache, numbness, weakness or ataxia or problems with walking or coordination,  change in mood or  memory.        Current Meds  Medication Sig  . aspirin 81 MG tablet Take 81 mg by mouth daily.  Marland Kitchen atorvastatin (LIPITOR) 40 MG tablet TAKE 1 TABLET(40 MG) BY MOUTH EVERY EVENING  . bisoprolol (ZEBETA) 5 MG tablet  Take 1 tablet (5 mg total) by mouth daily.  . cetirizine (ZYRTEC) 10 MG tablet Take 1 tablet (10 mg total) by mouth daily.  . Cholecalciferol (VITAMIN D) 2000 UNITS tablet Take 2,000 Units by mouth daily.  . Cyanocobalamin (B-12) 2000 MCG TABS Take by mouth.  . donepezil (ARICEPT) 5 MG tablet Take 1/2 tablet daily for 1 month, then increase to 1 tablet daily  . fluticasone (FLONASE) 50 MCG/ACT nasal spray Place 2 sprays into both nostrils daily.  . folic acid (FOLVITE) 1 MG tablet Take 1 mg by mouth daily.   . furosemide (LASIX) 20 MG tablet Take 1 tablet (20 mg total) by mouth daily.  . meloxicam (MOBIC) 15 MG tablet TAKE 1 TABLET(15 MG) BY MOUTH DAILY  . methotrexate (RHEUMATREX) 2.5 MG tablet Take 15 mg by mouth once a week.   Marland Kitchen MYRBETRIQ 25 MG TB24 tablet   . nystatin ointment (MYCOSTATIN) Apply 1 application topically 2 (two) times daily. (Patient taking differently: Apply 1 application topically 2 (two) times daily as needed. )  . omeprazole (PRILOSEC) 40 MG capsule Take 1 capsule (40 mg total) by mouth daily.  . pioglitazone (ACTOS) 30 MG tablet TAKE 1 TABLET BY MOUTH DAILY  . potassium chloride (MICRO-K) 10 MEQ CR capsule TAKE 1 CAPSULE BY MOUTH EVERY DAY  . sertraline (ZOLOFT) 50 MG tablet TAKE 1 TABLET(50 MG) BY MOUTH DAILY  . telmisartan (MICARDIS) 40 MG tablet Take 1 tablet (40 mg total) by mouth daily.  Marland Kitchen tiZANidine (ZANAFLEX) 4 MG tablet Take 1 tablet (4 mg total) by mouth every 6 (six) hours as needed for muscle spasms.  Marland Kitchen triamterene-hydrochlorothiazide (MAXZIDE) 75-50 MG tablet TAKE 1/2 TABLET BY MOUTH EVERY MORNING  . UNABLE TO FIND Med Name: CPAP with [facility]  . [DISCONTINUED] allopurinol (ZYLOPRIM) 300 MG tablet TAKE 1 TABLET(300 MG) BY MOUTH DAILY                 Objective:   Physical Exam  Obese amb wm nad   07/16/2017             357   04/15/2017            363   02/19/17 (!) 361 lb (163.7 kg)  01/17/17 (!) 358 lb 2 oz (162.4 kg)  09/27/16 (!) 364 lb 8 oz  (165.3 kg)    Vital signs reviewed - Note on arrival 02 sats  96% on RA  And bp 118//72          HEENT: nl dentition, turbinates bilaterally, and oropharynx. Nl external ear canals without cough reflex Modified Mallampati Score =   3    NECK :  without JVD/Nodes/TM/ nl carotid upstrokes bilaterally  LUNGS: no acc muscle use,  Nl contour chest which is clear to A and P bilaterally without cough on insp or exp maneuvers   CV:  RRR  no s3 or murmur or increase in P2, and trace bilateral pedal edema  ABD: massively obese    and nontender with limited  inspiratory excursion in the supine position. No bruits or organomegaly appreciated, bowel sounds nl  MS:  Nl gait/ ext warm without deformities, calf tenderness, cyanosis or clubbing No obvious joint restrictions   SKIN: warm and dry without lesions    NEURO:  alert, approp, nl sensorium with  no motor or cerebellar deficits apparent.          Assessment:

## 2017-07-16 NOTE — Patient Instructions (Signed)
Keep working on paced exercise and diet as you are   Seriously consider water aerobics to spare your joints   Next step if not happy with breathing would be trial off actos and on a substitute per Dr Birdie Riddle    . If you are satisfied with your treatment plan,  let your doctor know and he/she can either refill your medications or you can return here when your prescription runs out.     If in any way you are not 100% satisfied,  please tell us.  If 100% better, tell your friends!  Pulmonary follow up is as needed

## 2017-07-17 ENCOUNTER — Other Ambulatory Visit: Payer: Self-pay | Admitting: Family Medicine

## 2017-07-22 ENCOUNTER — Encounter: Payer: Self-pay | Admitting: Internal Medicine

## 2017-07-22 NOTE — Assessment & Plan Note (Signed)
ERV 3% at pfts 04/15/2017   Body mass index is 49.85 kg/m.  -  Trending down slightly, encouraged Lab Results  Component Value Date   TSH 3.23 09/27/2016     Contributing to gerd risk/ doe/OSA/ reviewed the need and the process to achieve and maintain neg calorie balance > defer f/u primary care including intermittently monitoring thyroid status

## 2017-07-22 NOTE — Assessment & Plan Note (Signed)
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   Refills per Dr Birdie Riddle

## 2017-07-22 NOTE — Assessment & Plan Note (Signed)
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  Please see AVS for specific  Instructions which are unique to this visit and I personally typed out  which were reviewed in detail in writing with the patient and a copy provided.

## 2017-07-22 NOTE — Assessment & Plan Note (Signed)
NPSG 1999:  AHI 79/hr Auto 06/2010:  Optimal cpap 15cm> referred back to DR Clance at [facility] 02/19/17   Appears well compensated at present > F/u here in our sleep medicine clinic is prn

## 2017-07-29 ENCOUNTER — Other Ambulatory Visit: Payer: Self-pay | Admitting: Family Medicine

## 2017-08-16 ENCOUNTER — Telehealth: Payer: Self-pay | Admitting: Family Medicine

## 2017-08-16 NOTE — Telephone Encounter (Signed)
Copied from [place] [id number]. Topic: Quick Communication - See Telephone Encounter >> Aug 16, 2017  1:23 PM Mylinda Latina, NT wrote: CRM for notification. See Telephone encounter for: 08/16/17. Shida calling from [employer] states the patient was prescribed Bactrim DS today by a hospitalist and that medication will interfere with the methotrexate (RHEUMATREX) 2.5 MG tablet. Patient told the pharmacist that she needed to contact the PCP to discuss this . Please call the pharmacist. CB# [phone]. She is requesting a call back today is possible.

## 2017-08-16 NOTE — Telephone Encounter (Signed)
Spoke with Pharmacist. She is aware that she will need to call his Rheumatologist.

## 2017-08-16 NOTE — Telephone Encounter (Signed)
I don't have any record of recent hospitalization and I don't know what the Bactrim was prescribed for.  I cannot speak to any medication changes b/c I don't have any information.  And if it involves the Methotrexate, I would recommend asking his Rheumatologist (as I don't manage this medication)

## 2017-08-20 ENCOUNTER — Other Ambulatory Visit: Payer: Self-pay | Admitting: Family Medicine

## 2017-08-20 ENCOUNTER — Other Ambulatory Visit: Payer: Self-pay | Admitting: General Practice

## 2017-08-20 MED ORDER — MELOXICAM 15 MG PO TABS: ORAL_TABLET | ORAL | 0 refills | Status: DC

## 2017-08-20 NOTE — Telephone Encounter (Signed)
Last OV 06/17/17 meloxicam last filled 08-20-17 #30 with 0, previously filled 04/22/17 #30 with 3  Pt would like a 90 day supply ok to fill?

## 2017-09-02 ENCOUNTER — Other Ambulatory Visit: Payer: Self-pay | Admitting: Family Medicine

## 2017-09-09 NOTE — Progress Notes (Signed)
Patient presents to clinic today for follow-up of hypertension and hyperlipidemia in PCP absence. Patient is currently on a regimen of Bisoprolol 5 mg QD, Telmisartan 40 mg QD, and Triamterene-Maxide 75-50 mg Qd. ENdorses taking daily as directed. Patient denies chest pain, palpitations, lightheadedness, dizziness, vision changes or frequent headaches. Is also taking his 40 mg Atorvastatin daily.   BP Readings from Last 3 Encounters:  09/10/17 100/62  07/16/17 118/72  06/17/17 118/82   On review of chart, patient also with DM II diet controlled. A1C at 6.9 last checked 1 year ago. Is trying to keep a well-balanced diet. Per HM is up-to-date on eye exam and immunizations.   Past Medical History:  Diagnosis Date  . Arthritis   . Constipation   . Diabetes mellitus   . Heart murmur   . History of gout   . History of skin cancer   . Hypertension   . OSA (obstructive sleep apnea)   . Psoriatic arthritis ([place])   . Rectal fissure   . Sleep apnea   . Tinnitus     Current Outpatient Medications on File Prior to Visit  Medication Sig Dispense Refill  . allopurinol (ZYLOPRIM) 300 MG tablet TAKE 1 TABLET(300 MG) BY MOUTH DAILY 30 tablet 6  . aspirin 81 MG tablet Take 81 mg by mouth daily.    Marland Kitchen atorvastatin (LIPITOR) 40 MG tablet TAKE 1 TABLET(40 MG) BY MOUTH EVERY EVENING 90 tablet 0  . bisoprolol (ZEBETA) 5 MG tablet Take 1 tablet (5 mg total) by mouth daily. 30 tablet 11  . cetirizine (ZYRTEC) 10 MG tablet Take 1 tablet (10 mg total) by mouth daily. 30 tablet 11  . Cholecalciferol (VITAMIN D) 2000 UNITS tablet Take 2,000 Units by mouth daily.    . Cyanocobalamin (B-12) 2000 MCG TABS Take by mouth.    . donepezil (ARICEPT) 5 MG tablet Take 1/2 tablet daily for 1 month, then increase to 1 tablet daily 30 tablet 11  . etodolac (LODINE) 400 MG tablet Take by mouth.    . fluticasone (FLONASE) 50 MCG/ACT nasal spray Place 2 sprays into both nostrils daily. 16 g 6  . folic acid (FOLVITE) 1 MG  tablet Take 1 mg by mouth daily.     . furosemide (LASIX) 20 MG tablet Take 1 tablet (20 mg total) by mouth daily. 90 tablet 3  . meloxicam (MOBIC) 15 MG tablet TAKE 1 TABLET(15 MG) BY MOUTH DAILY 90 tablet 0  . MYRBETRIQ 25 MG TB24 tablet     . nystatin ointment (MYCOSTATIN) Apply 1 application topically 2 (two) times daily. (Patient taking differently: Apply 1 application topically 2 (two) times daily as needed. ) 90 g 1  . omeprazole (PRILOSEC) 40 MG capsule Take 1 capsule (40 mg total) by mouth daily. 90 capsule 1  . pioglitazone (ACTOS) 30 MG tablet TAKE 1 TABLET BY MOUTH DAILY 90 tablet 0  . potassium chloride (MICRO-K) 10 MEQ CR capsule TAKE 1 CAPSULE BY MOUTH EVERY DAY 90 capsule 0  . ramipril (ALTACE) 5 MG capsule TK 1 C PO D  1  . sertraline (ZOLOFT) 50 MG tablet TAKE 1 TABLET(50 MG) BY MOUTH DAILY 90 tablet 0  . telmisartan (MICARDIS) 40 MG tablet Take 1 tablet (40 mg total) by mouth daily. 30 tablet 11  . tiZANidine (ZANAFLEX) 4 MG tablet Take 1 tablet (4 mg total) by mouth every 6 (six) hours as needed for muscle spasms. 30 tablet 0  . triamterene-hydrochlorothiazide (MAXZIDE) 75-50 MG tablet  TAKE 1/2 TABLET BY MOUTH EVERY MORNING 30 tablet 0  . UNABLE TO FIND Med Name: CPAP with [facility]    . methotrexate (RHEUMATREX) 2.5 MG tablet Take 15 mg by mouth once a week.      No current facility-administered medications on file prior to visit.     Allergies  Allergen Reactions  . Lyrica [Pregabalin] Swelling  . Penicillins Swelling  . Levofloxacin Rash    Family History  Problem Relation Age of Onset  . Emphysema Father   . Clotting disorder Father   . Arthritis Father   . Heart disease Mother   . Arthritis Mother   . Arthritis Maternal Grandmother   . Hypertension Maternal Grandmother   . Diabetes Maternal Grandmother   . Arthritis Paternal Grandmother   . Diabetes Maternal Aunt   . Diabetes Paternal Aunt     Social History   Socioeconomic History  . Marital status:  Married    Spouse name: Not on file  . Number of children: 2  . Years of education: Not on file  . Highest education level: Not on file  Occupational History  . 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 Financial resource strain: Not on file  . Food insecurity:    Worry: Not on file    Inability: Not on file  . Transportation needs:    Medical: Not on file    Non-medical: Not on file  Tobacco Use  . Smoking status: Never Smoker  . Smokeless tobacco: Never Used  Substance and Sexual Activity  . Alcohol use: Yes    Alcohol/week: 0.0 oz    Comment: Once a month  . Drug use: No  . Sexual activity: Never  Lifestyle  . Physical activity:    Days per week: Not on file    Minutes per session: Not on file  . Stress: Not on file  Relationships  . Social connections:    Talks on phone: Not on file    Gets together: Not on file    Attends religious service: Not on file    Active member of club or organization: Not on file    Attends meetings of clubs or organizations: Not on file    Relationship status: Not on file  Other Topics Concern  . Not on file  Social History Narrative   Alcohol- 1 to 2 per month.    Review of Systems - See HPI.  All other ROS are negative.  BP 100/62   Pulse 61   Temp 97.6 F (36.4 C) (Oral)   Resp 15   Ht '5\' 11"'  (1.803 m)   Wt (!) 359 lb 3.2 oz (162.9 kg)   SpO2 93%   BMI 50.10 kg/m   Physical Exam  Constitutional: He is oriented to person, place, and time. He appears well-developed and well-nourished.  HENT:  Head: Normocephalic and atraumatic.  Eyes: Conjunctivae are normal.  Neck: Neck supple.  Cardiovascular: Normal rate, regular rhythm, normal heart sounds and intact distal pulses.  Pulmonary/Chest: Effort normal and breath sounds normal. No stridor. No respiratory distress. He has no wheezes. He has no rales. He exhibits no tenderness.  Neurological: He is alert and  oriented to person, place, and time.  Psychiatric: He has a normal mood and affect.  Vitals reviewed.  Assessment/Plan: 1. Essential hypertension BP stable today. Asymptomatic. Will check CMP. Continue current regimen. Follow-up with PCP next month  for CPE and MWV. - Comp Met (CMET)  2. Hyperlipidemia, unspecified hyperlipidemia type Taking medications as directed. Notes tolerating well. Continue current regimen. Will repeat fasting labs today. - Comp Met (CMET) - Lipid panel  3. Type 2 diabetes mellitus without complication, without long-term current use of insulin (HCC) Diet controlled. Will check A1C and CMP today.  - Hemoglobin A1c   Leeanne Rio, [place]

## 2017-09-10 ENCOUNTER — Encounter: Payer: Self-pay | Admitting: Physician Assistant

## 2017-09-10 ENCOUNTER — Other Ambulatory Visit: Payer: Self-pay

## 2017-09-10 ENCOUNTER — Ambulatory Visit: Payer: Medicare Other | Admitting: Physician Assistant

## 2017-09-10 VITALS — BP 100/62 | HR 61 | Temp 97.6°F | Resp 15 | Ht 71.0 in | Wt 359.2 lb

## 2017-09-10 DIAGNOSIS — I10 Essential (primary) hypertension: Secondary | ICD-10-CM | POA: Diagnosis not present

## 2017-09-10 DIAGNOSIS — E119 Type 2 diabetes mellitus without complications: Secondary | ICD-10-CM

## 2017-09-10 DIAGNOSIS — E785 Hyperlipidemia, unspecified: Secondary | ICD-10-CM | POA: Diagnosis not present

## 2017-09-10 LAB — COMPREHENSIVE METABOLIC PANEL
ALT: 12 U/L (ref 0–53)
AST: 13 U/L (ref 0–37)
Albumin: 3.5 g/dL (ref 3.5–5.2)
Alkaline Phosphatase: 108 U/L (ref 39–117)
BUN: 21 mg/dL (ref 6–23)
CO2: 31 mEq/L (ref 19–32)
Calcium: 9 mg/dL (ref 8.4–10.5)
Chloride: 99 mEq/L (ref 96–112)
Creatinine, Ser: 1.33 mg/dL (ref 0.40–1.50)
GFR: 56.05 mL/min — ABNORMAL LOW (ref >60.00)
Glucose, Bld: 181 mg/dL — ABNORMAL HIGH (ref 70–99)
Potassium: 3.9 mEq/L (ref 3.5–5.1)
Sodium: 137 mEq/L (ref 135–145)
Total Bilirubin: 0.9 mg/dL (ref 0.2–1.2)
Total Protein: 5.6 g/dL — ABNORMAL LOW (ref 6.0–8.3)

## 2017-09-10 LAB — LIPID PANEL
Cholesterol: 96 mg/dL (ref 0–200)
HDL: 34.3 mg/dL — ABNORMAL LOW (ref >39.00)
LDL Cholesterol: 36 mg/dL (ref 0–99)
NonHDL: 61.25
Total CHOL/HDL Ratio: 3
Triglycerides: 127 mg/dL (ref 0.0–149.0)
VLDL: 25.4 mg/dL (ref 0.0–40.0)

## 2017-09-10 LAB — HEMOGLOBIN A1C: Hgb A1c MFr Bld: 7.2 % — ABNORMAL HIGH (ref 4.6–6.5)

## 2017-09-10 MED ORDER — TRIAMTERENE-HCTZ 75-50 MG PO TABS: 0.5000 | ORAL_TABLET | Freq: Every morning | ORAL | 1 refills | Status: DC

## 2017-09-10 NOTE — Patient Instructions (Signed)
Please go to the lab today for blood work.  I will call you with your results. We will alter treatment regimen(s) if indicated by your results.   Please continue medications as directed. Check BP at home 2-3 x week and record.  Follow-up next month for your annual physical with Dr. Birdie Riddle and wellness with Maudie Mercury.

## 2017-09-11 ENCOUNTER — Other Ambulatory Visit: Payer: Self-pay

## 2017-09-11 DIAGNOSIS — R7309 Other abnormal glucose: Secondary | ICD-10-CM

## 2017-09-15 ENCOUNTER — Other Ambulatory Visit: Payer: Self-pay | Admitting: Family Medicine

## 2017-10-04 ENCOUNTER — Ambulatory Visit (INDEPENDENT_AMBULATORY_CARE_PROVIDER_SITE_OTHER)
Admission: RE | Admit: 2017-10-04 | Discharge: 2017-10-04 | Disposition: A | Discharge status: Home / Self Care | Payer: Medicare Other | Source: Ambulatory Visit | Attending: Internal Medicine | Admitting: Internal Medicine

## 2017-10-04 ENCOUNTER — Ambulatory Visit: Payer: Medicare Other | Admitting: Internal Medicine

## 2017-10-04 ENCOUNTER — Other Ambulatory Visit (INDEPENDENT_AMBULATORY_CARE_PROVIDER_SITE_OTHER): Payer: Medicare Other

## 2017-10-04 ENCOUNTER — Encounter: Payer: Self-pay | Admitting: Internal Medicine

## 2017-10-04 ENCOUNTER — Other Ambulatory Visit: Payer: Self-pay | Admitting: Internal Medicine

## 2017-10-04 VITALS — BP 132/90 | HR 87 | Ht 71.5 in | Wt 379.0 lb

## 2017-10-04 DIAGNOSIS — G4733 Obstructive sleep apnea (adult) (pediatric): Secondary | ICD-10-CM

## 2017-10-04 DIAGNOSIS — R0600 Dyspnea, unspecified: Secondary | ICD-10-CM

## 2017-10-04 DIAGNOSIS — R0609 Other forms of dyspnea: Secondary | ICD-10-CM

## 2017-10-04 DIAGNOSIS — I10 Essential (primary) hypertension: Secondary | ICD-10-CM

## 2017-10-04 LAB — CBC WITH DIFFERENTIAL/PLATELET
Basophils Absolute: 0 10*3/uL (ref 0.0–0.1)
Basophils Relative: 0.6 % (ref 0.0–3.0)
Eosinophils Absolute: 0.3 10*3/uL (ref 0.0–0.7)
Eosinophils Relative: 4.4 % (ref 0.0–5.0)
HCT: 42.2 % (ref 39.0–52.0)
Hemoglobin: 14.1 g/dL (ref 13.0–17.0)
Lymphocytes Relative: 10.9 % — ABNORMAL LOW (ref 12.0–46.0)
Lymphs Abs: 0.8 10*3/uL (ref 0.7–4.0)
MCHC: 33.3 g/dL (ref 30.0–36.0)
MCV: 94.6 fl (ref 78.0–100.0)
Monocytes Absolute: 0.4 10*3/uL (ref 0.1–1.0)
Monocytes Relative: 5 % (ref 3.0–12.0)
Neutro Abs: 5.7 10*3/uL (ref 1.4–7.7)
Neutrophils Relative %: 79.1 % — ABNORMAL HIGH (ref 43.0–77.0)
Platelets: 229 10*3/uL (ref 150.0–400.0)
RBC: 4.46 Mil/uL (ref 4.22–5.81)
RDW: 16.9 % — ABNORMAL HIGH (ref 11.5–15.5)
WBC: 7.2 10*3/uL (ref 4.0–10.5)

## 2017-10-04 LAB — SEDIMENTATION RATE: Sed Rate: 29 mm/hr — ABNORMAL HIGH (ref 0–20)

## 2017-10-04 LAB — BASIC METABOLIC PANEL
BUN: 15 mg/dL (ref 6–23)
CO2: 27 mEq/L (ref 19–32)
Calcium: 9.2 mg/dL (ref 8.4–10.5)
Chloride: 103 mEq/L (ref 96–112)
Creatinine, Ser: 1.09 mg/dL (ref 0.40–1.50)
GFR: 70.51 mL/min (ref >60.00)
Glucose, Bld: 161 mg/dL — ABNORMAL HIGH (ref 70–99)
Potassium: 3.7 mEq/L (ref 3.5–5.1)
Sodium: 138 mEq/L (ref 135–145)

## 2017-10-04 LAB — TSH: TSH: 2.89 u[IU]/mL (ref 0.35–4.50)

## 2017-10-04 LAB — BRAIN NATRIURETIC PEPTIDE: Pro B Natriuretic peptide (BNP): 274 pg/mL — ABNORMAL HIGH (ref 0.0–100.0)

## 2017-10-04 MED ORDER — OMEPRAZOLE 40 MG PO CPDR: DELAYED_RELEASE_CAPSULE | ORAL | 1 refills | Status: DC

## 2017-10-04 MED ORDER — FUROSEMIDE 40 MG PO TABS: 40.0000 mg | ORAL_TABLET | Freq: Every day | ORAL | 0 refills | Status: DC

## 2017-10-04 NOTE — Patient Instructions (Addendum)
Stop actos  > check sugars fasting and let your doctor know if fasting is above 140   Change prilosec to 40 mg Take 30- 60 min before your first and last meals of the day   Please see patient coordinator before you leave today  to schedule CPAP titation  And f/u with DR Duard Brady next available    Please remember to go to the lab and x-ray department downstairs in the basement  for your tests - we will call you with the results when they are available. - add needs echo

## 2017-10-04 NOTE — Progress Notes (Signed)
Subjective:     Patient ID: Gary Butler, male   DOB: 1945/12/10,     MRN: [id number]    Brief patient profile:  18 yowm never smoker grew up in [place] with [place] complicated by OSA last eval by [facility] 2016 referred to pulmonary clinic 02/19/2017 by Dr Birdie Riddle for unexplained sob x summer 2018.    History of Present Illness  02/19/2017 1st [facility] office visit/ Gary Butler   Chief Complaint  Patient presents with  . Pulmonary Consult    Referred by Dr. Birdie Riddle. Pt c/o SOB for the past 6 months. He states he wakes up in the am with SOB and coughing. He states that he coughs up clear to yellow sputum. He states he gets SOB walking just to his mailbox. He also gets SOB when eating and has to stop and take breaks.   retired Firefighter very sedentary with  Baseline = doe x 50 ft slt decline to MB and mild doe on way back same x 4 years and then x summer of 2018 much  more difficult to get up the incline to the house and also sob  with eating / assoc with wt gain of about 40 lb and gagging x 2 months unless uses cpap at hs.  Already tried on ppi s improvent Also limited by knees and carries dx of RA on MTX Also on actos/ ACEi and tenormin x years  rec Try off altace (ramapril) but no other change in medications  micardis 40 mg one daily and your symptoms should gradually improve Please schedule a follow up office visit in 6 weeks, call sooner if needed with pfts on return      04/15/2017  f/u ov/Gary Butler re: MO ? RA bronchiolitis?  Chief Complaint  Patient presents with  . Follow-up    PFT's done. His breathing is unchanged. No new co's.   Dyspnea:  Same severity walking to mb and back  Cough: 1st thing x 30 min p clearing mucus mostly clear  Sleep: ok cpap SABA use:  None  Gagging less of problem, still sometimes sob eating  But not choking on food off acei rec Stop tenormin Start Bisoprolol 5 mg daily in place of tenormin May need a substitute for actos if breathing problems continue.   Strongly recommend you return to water aerobics       07/16/2017  f/u ov/Gary Butler re:  MO/ sob  Chief Complaint  Patient presents with  . Follow-up    Breathing has improved alot but not quite back to his normal baseline.    Dyspnea:  mb and back is easier now  Cough: none Sleep: on cpap fine  SABA use:  None  Sleeping  On cpap    rec Keep working on paced exercise and diet as you are  Seriously consider water aerobics to spare your joints Next step if not happy with breathing would be trial off actos and on a substitute per Dr Birdie Riddle F/u  [facility]ance at [facility] did not go     10/04/2017 acute extended ov/Gary Butler re:   Worsening sob/ still On actos  Chief Complaint  Patient presents with  . Acute Visit    SOB worse ,Cough in the morning with mucus clear,and after eatting.   sleeping on cpap on side on   2 pillows  Ok but feeling lots of "am congestion" with lots of clear mucus and also worse again after eating. Doe = MMRC3 = can't walk 100 yards  even at a slow pace at a flat grade s stopping due to sob     No obvious day to day or daytime variability or assoc  purulent sputum or mucus plugs or hemoptysis or cp or chest tightness, subjective wheeze or overt sinus or hb symptoms.   Sleeping as above  without nocturnal   exacerbation  of respiratory  c/o's or need for noct saba. Also denies any obvious fluctuation of symptoms with weather or environmental changes or other aggravating or alleviating factors except as outlined above   No unusual exposure hx or h/o childhood pna/ asthma or knowledge of premature birth.  Current Allergies, Complete Past Medical History, Past Surgical History, Family History, and Social History were reviewed in [employer] record.  ROS  The following are not active complaints unless bolded Hoarseness, sore throat, dysphagia, dental problems, itching, sneezing,  nasal congestion or discharge of excess mucus or purulent secretions, ear ache,    fever, chills, sweats, unintended wt loss or wt gain, classically pleuritic or exertional cp,  orthopnea pnd or arm/hand swelling  or leg swelling, presyncope, palpitations, abdominal pain, anorexia, nausea, vomiting, diarrhea  or change in bowel habits or change in bladder habits, change in stools or change in urine, dysuria, hematuria,  rash, arthralgias, visual complaints, headache, numbness, weakness or ataxia or problems with walking or coordination,  change in mood or  memory.        Current Meds  Medication Sig  . allopurinol (ZYLOPRIM) 300 MG tablet TAKE 1 TABLET(300 MG) BY MOUTH DAILY  . aspirin 81 MG tablet Take 81 mg by mouth daily.  Marland Kitchen atorvastatin (LIPITOR) 40 MG tablet TAKE 1 TABLET(40 MG) BY MOUTH EVERY EVENING  . bisoprolol (ZEBETA) 5 MG tablet Take 1 tablet (5 mg total) by mouth daily.  . cetirizine (ZYRTEC) 10 MG tablet Take 1 tablet (10 mg total) by mouth daily.  . Cholecalciferol (VITAMIN D) 2000 UNITS tablet Take 2,000 Units by mouth daily.  . Cyanocobalamin (B-12) 2000 MCG TABS Take by mouth.  . donepezil (ARICEPT) 5 MG tablet Take 1/2 tablet daily for 1 month, then increase to 1 tablet daily  . etodolac (LODINE) 400 MG tablet Take by mouth.  . fluticasone (FLONASE) 50 MCG/ACT nasal spray Place 2 sprays into both nostrils daily.  . folic acid (FOLVITE) 1 MG tablet Take 1 mg by mouth daily.   . meloxicam (MOBIC) 15 MG tablet TAKE 1 TABLET(15 MG) BY MOUTH DAILY  . methotrexate (RHEUMATREX) 2.5 MG tablet Take 15 mg by mouth once a week.   Marland Kitchen MYRBETRIQ 25 MG TB24 tablet   . nystatin ointment (MYCOSTATIN) Apply 1 application topically 2 (two) times daily. (Patient taking differently: Apply 1 application topically 2 (two) times daily as needed. )  . omeprazole (PRILOSEC) 40 MG capsule Take 30- 60 min before your first and last meals of the day  . potassium chloride (MICRO-K) 10 MEQ CR capsule TAKE 1 CAPSULE BY MOUTH EVERY DAY  . sertraline (ZOLOFT) 50 MG tablet TAKE 1 TABLET(50  MG) BY MOUTH DAILY  . telmisartan (MICARDIS) 40 MG tablet Take 1 tablet (40 mg total) by mouth daily.  Marland Kitchen tiZANidine (ZANAFLEX) 4 MG tablet Take 1 tablet (4 mg total) by mouth every 6 (six) hours as needed for muscle spasms.  Marland Kitchen triamterene-hydrochlorothiazide (MAXZIDE) 75-50 MG tablet Take 0.5 tablets by mouth every morning.  Marland Kitchen UNABLE TO FIND Med Name: CPAP with [employer]  .   furosemide (LASIX) 20 MG tablet Take  1 tablet (20 mg total) by mouth daily.  Marland Kitchen  ] omeprazole (PRILOSEC) 40 MG capsule Take 1 capsule (40 mg total) by mouth daily.  .   pioglitazone (ACTOS) 30 MG tablet TAKE 1 TABLET BY MOUTH DAILY                  Objective:   Physical Exam  Obese amb mw    10/04/2017             379  07/16/2017             357   04/15/2017            363   02/19/17 (!) 361 lb (163.7 kg)  01/17/17 (!) 358 lb 2 oz (162.4 kg)  09/27/16 (!) 364 lb 8 oz (165.3 kg)    Vital signs reviewed - Note on arrival 02 sats  90% on RA        HEENT: nl dentition, turbinates bilaterally, and oropharynx. Nl external ear canals without cough reflex - Modified Mallampati Score =   3    NECK :  without JVD/Nodes/TM/ nl carotid upstrokes bilaterally   LUNGS: no acc muscle use,  Nl contour chest which is clear to A and P bilaterally without cough on insp or exp maneuvers   CV:  RRR  no s3 or murmur or increase in P2, and  1+ bilateral lower ext pitting edema   ABD:  Tensely obese/ very poor inspiratory excursion in the supine position. No bruits or organomegaly appreciated, bowel sounds nl  MS:  Nl gait/ ext warm without deformities, calf tenderness, cyanosis or clubbing No obvious joint restrictions   SKIN: warm and dry without lesions    NEURO:  alert, approp, nl sensorium with  no motor or cerebellar deficits apparent.     CXR PA and Lateral:   10/04/2017 :    I personally reviewed images and agree with radiology impression as follows:    New small bilateral pleural effusions.   Labs ordered/  reviewed:      Chemistry      Component Value Date/Time   NA 138 10/04/2017 1550   NA 139 06/17/2013 0946   K 3.7 10/04/2017 1550   K 3.5 06/17/2013 0946   CL 103 10/04/2017 1550   CL 102 05/14/2012 0856   CO2 27 10/04/2017 1550   CO2 27 06/17/2013 0946   BUN 15 10/04/2017 1550   BUN 13.8 06/17/2013 0946   CREATININE 1.09 10/04/2017 1550   CREATININE 0.95 07/14/2015 1019   CREATININE 1.1 06/17/2013 0946   GLU 128 03/25/2013      Component Value Date/Time   CALCIUM 9.2 10/04/2017 1550   CALCIUM 9.3 06/17/2013 0946   ALKPHOS 108 09/10/2017 0947   ALKPHOS 101 06/17/2013 0946   AST 13 09/10/2017 0947   AST 19 06/17/2013 0946   ALT 12 09/10/2017 0947   ALT 18 06/17/2013 0946   BILITOT 0.9 09/10/2017 0947   BILITOT 1.54 (H) 06/17/2013 0946        Lab Results  Component Value Date   WBC 7.2 10/04/2017   HGB 14.1 10/04/2017   HCT 42.2 10/04/2017   MCV 94.6 10/04/2017   PLT 229.0 10/04/2017      EOS  0.3                                     10/04/2017     Lab Results  Component Value Date   TSH 2.89 10/04/2017     Lab Results  Component Value Date   PROBNP 274.0 (H) 10/04/2017       Lab Results  Component Value Date   ESRSEDRATE 29 (H) 10/04/2017                  Assessment:

## 2017-10-05 ENCOUNTER — Encounter: Payer: Self-pay | Admitting: Internal Medicine

## 2017-10-05 NOTE — Assessment & Plan Note (Signed)
ERV 3% at pfts 04/15/2017   Body mass index is 52.12 kg/m.  -  trending up some of which is water wt gain  Lab Results  Component Value Date   TSH 2.89 10/04/2017     Contributing to gerd risk/ doe/reviewed the need and the process to achieve and maintain neg calorie balance > defer f/u primary care including intermittently monitoring thyroid status

## 2017-10-05 NOTE — Assessment & Plan Note (Addendum)
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 I reviewed this with the patient and emphasized importance of follow-up with primary care and doubled lasix dose today

## 2017-10-05 NOTE — Assessment & Plan Note (Signed)
NPSG 1999:  AHI 79/hr Auto 06/2010:  Optimal cpap 15cm> referred back to Dr Gwenette Greet at [facility] 02/19/17 but did not go  CPAP titration ordered, f/u Dr Carolin Coy if not going to return to [facility] this needs to be done before he'll qualify for 02

## 2017-10-05 NOTE — Assessment & Plan Note (Addendum)
Echo 08/01/15 mild LVH. Systolic function was normal.   The estimated ejection fraction was in the range of 60% to 65%.   Features are consistent with a pseudonormal left ventricular   filling pattern, with concomitant abnormal relaxation and   increased filling pressure (grade 2 diastolic dysfunction).   Doppler parameters are consistent with high ventricular filling   pressure. - Aortic valve: AV is thickened, calcified Peak and mean gradients   through the valve are 27 and 15 mm Hg respectively consistent   with mild AS 02/19/2017  Walked RA x 2 laps @ 185 ft each stopped due to  Sob no desats  - trial off acei 02/19/2017 >>> gagging improved / doe did not  - 04/15/2017  Walked RA x 2 laps @ 185 ft each stopped due to sob nl pace  - PFT's  04/15/2017  FEV1 2.02  (61 % ) ratio 67  p 4 % improvement from saba p nothing  prior to study with minimal curvature  with DLCO  55/55c  % corrects to 73  % for alv volume  With ERV 3% c/w effects of obesity  - 04/15/2017 try change tenormin to bisoprol due to 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  - 07/16/2017  Walked RA x 3 laps @ 185 ft each stopped due to  End of study, nl pace, no sob or desat  - Stop actos and double lasix to 40 mg daily 10/04/2017 with bilateral effusions on cxr     Symptoms are markedly disproportionate to objective findings but  pt does appear to have difficult to sort out respiratory symptoms of unknown origin for which  DDX  = almost all start with A and  include Adherence, Ace Inhibitors, Acid Reflux, Active Sinus Disease, Alpha 1 Antitripsin deficiency, Anxiety masquerading as Airways dz,  ABPA,  Allergy(esp in young), Aspiration (esp in elderly), Adverse effects of meds,  Active smokers, A bunch of PE's/clot burden (a few small clots can't cause this syndrome unless there is already severe underlying pulm or vascular dz with poor reserve),  Anemia or thyroid disorder, plus two Bs  = Bronchiectasis and Beta blocker use..and one C= CHF     Adherence is always the initial "prime suspect" and is a multilayered concern that requires a "trust but verify" approach in every patient - starting with knowing how to use medications, especially inhalers, correctly, keeping up with refills and understanding the fundamental difference between maintenance and prns vs those medications only taken for a very short course and then stopped and not refilled.  - advised return with all meds in hand using a trust but verify approach to confirm accurate Medication  Reconciliation The principal here is that until we are certain that the  patients are doing what we've asked, it makes no sense to ask them to do more.   ? Acid (or non-acid) GERD > always difficult to exclude as up to 75% of pts in some series report no assoc GI/ Heartburn symptoms> rec max (24h)  acid suppression and diet restrictions/ reviewed and instructions given in writing.       ? Adverse effects of meds > d/c actos  ? Allergy/ asthma > nothing to suggest but difficult to exclude component of cardiac asthma > see below  Anemia and thryoid dz > ruled out today    ? Beta blocker effects > very unlikely on bisoprolol, the most selective generic on the market  ? CHF > bnp trending up and tends to  be falsely low in MO - last echo 2017 c/w AS/  Grade 2 diastolic dysfunction  as above so will need repeat echo and double lasix in meantime to 40 mg daily / off actos as above.    I had an extended discussion with the patient reviewing all relevant studies completed to date and  lasting 25 minutes of a 40  minute acute office  visit addressing  severe non-specific but potentially very serious refractory respiratory symptoms of uncertain and potentially multiple  etiologies.   Each maintenance medication was reviewed in detail including most importantly the difference between maintenance and prns and under what circumstances the prns are to be triggered using an action plan format that is  not reflected in the computer generated alphabetically organized AVS.    Please see AVS for specific instructions unique to this office visit that I personally wrote and verbalized to the the pt in detail and then reviewed with pt  by my nurse highlighting any changes in therapy/plan of care  recommended at today's visit.

## 2017-10-08 ENCOUNTER — Telehealth: Payer: Self-pay | Admitting: *Deleted

## 2017-10-08 DIAGNOSIS — R0600 Dyspnea, unspecified: Secondary | ICD-10-CM

## 2017-10-08 DIAGNOSIS — R0609 Other forms of dyspnea: Principal | ICD-10-CM

## 2017-10-08 NOTE — Telephone Encounter (Signed)
-----   Message from Tanda Rockers, MD sent at 10/05/2017  7:39 AM EDT ----- Advise needs echo repeated for doe

## 2017-10-09 ENCOUNTER — Encounter: Payer: Medicare Other | Attending: Family Medicine | Admitting: *Deleted

## 2017-10-09 DIAGNOSIS — Z713 Dietary counseling and surveillance: Secondary | ICD-10-CM | POA: Insufficient documentation

## 2017-10-09 DIAGNOSIS — E119 Type 2 diabetes mellitus without complications: Secondary | ICD-10-CM | POA: Insufficient documentation

## 2017-10-09 NOTE — Patient Instructions (Addendum)
Plan:  Aim for 3-4 Carb Choices per meal (45-60 grams)  Aim for 0-2 Carbs per snack if hungry  Include protein in moderation with your meals and snacks Consider reading food labels for Total Carbohydrate of foods When able consider  increasing your activity level by water walking or Arm Chair Exercises as tolerated Consider checking BG at alternate times per day 2-3 days a week

## 2017-10-09 NOTE — Progress Notes (Signed)
Diabetes Self-Management Education  Visit Type: First/Initial  Appt. Start Time: 0950 Appt. End Time: 1115  10/09/2017  [name], identified by name and date of birth, is a 72 y.o. male with a diagnosis of Diabetes: Type 2. Patient has had Diabetes for almost 20 years, his A1c just rose to 7.2% and he evidently is not on any diabetes medication. He states he is getting an EKG later this week due to some recent shortness of breath. 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 He states he has a meter but tests infrequently.   ASSESSMENT  There were no vitals taken for this visit. There is no height or weight on file to calculate BMI.  Diabetes Self-Management Education - 10/09/17 1003      Visit Information   Visit Type  First/Initial      Initial Visit   Diabetes Type  Type 2    Are you currently following a meal plan?  No    Are you taking your medications as prescribed?  Yes    Date Diagnosed  1990      Health Coping   How would you rate your overall health?  Fair      Psychosocial Assessment   Patient Belief/Attitude about Diabetes  Motivated to manage diabetes    Self-care barriers  None    Self-management support  Family    Other persons present  Patient    Patient Concerns  Nutrition/Meal planning;Weight Control;Glycemic Control    Special Needs  None    How often do you need to have someone help you when you read instructions, pamphlets, or other written materials from your doctor or pharmacy?  1 - Never    What is the last grade level you completed in school?  Ph D      Pre-Education Assessment   Patient understands the diabetes disease and treatment process.  Needs Instruction    Patient understands incorporating nutritional management into lifestyle.  Needs Instruction    Patient undertands incorporating physical activity into lifestyle.  Needs Instruction    Patient understands using  medications safely.  Needs Instruction    Patient understands monitoring blood glucose, interpreting and using results  Needs Instruction    Patient understands prevention, detection, and treatment of acute complications.  Needs Instruction    Patient understands prevention, detection, and treatment of chronic complications.  Needs Instruction    Patient understands how to develop strategies to address psychosocial issues.  Needs Instruction    Patient understands how to develop strategies to promote health/change behavior.  Needs Instruction      Complications   Last HgB A1C per patient/outside source  7.2 %    How often do you check your blood sugar?  0 times/day (not testing)    Have you had a dilated eye exam in the past 12 months?  Yes    Have you had a dental exam in the past 12 months?  Yes    Are you checking your feet?  Yes    How many days per week are you checking your feet?  7      Dietary Intake   Breakfast  cereal (sweetened  or unsweetened) & 2% milk  OR frozen waffle with bacon    Snack (morning)  no    Lunch  at home; hot dog or hamburger with fresh fruit OR skips 2-3 days a week  Snack (afternoon)  not hungry    Dinner  mac and cheese casserole with ham OR hot dogs OR home made soup with crackers and fresh fruit OR chicken, starch, vegetables    Snack (evening)  ice cream x 1 cream sicle or sandwich    Beverage(s)  hot tea with sweetener and milk, diet soda, water, iced tea plain      Exercise   Exercise Type  ADL's   stretches with band every AM   How many days per week to you exercise?  7    How many minutes per day do you exercise?  10    Total minutes per week of exercise  70      Patient Education   Previous Diabetes Education  Yes (please comment)    Disease state   Definition of diabetes, type 1 and 2, and the diagnosis of diabetes;Factors that contribute to the development of diabetes    Nutrition management   Role of diet in the treatment of diabetes and  the relationship between the three main macronutrients and blood glucose level;Food label reading, portion sizes and measuring food.;Carbohydrate counting;Reviewed blood glucose goals for pre and post meals and how to evaluate the patients' food intake on their blood glucose level.    Physical activity and exercise   Role of exercise on diabetes management, blood pressure control and cardiac health.;Identified with patient nutritional and/or medication changes necessary with exercise.    Medications  Reviewed patients medication for diabetes, action, purpose, timing of dose and side effects.    Monitoring  Purpose and frequency of SMBG.;Identified appropriate SMBG and/or A1C goals.    Psychosocial adjustment  Role of stress on diabetes      Individualized Goals (developed by patient)   Nutrition  Follow meal plan discussed    Physical Activity  Not Applicable    Monitoring   test blood glucose pre and post meals as discussed      Post-Education Assessment   Patient understands the diabetes disease and treatment process.  Demonstrates understanding / competency    Patient understands incorporating nutritional management into lifestyle.  Demonstrates understanding / competency    Patient undertands incorporating physical activity into lifestyle.  Demonstrates understanding / competency    Patient understands using medications safely.  Demonstrates understanding / competency    Patient understands monitoring blood glucose, interpreting and using results  Demonstrates understanding / competency    Patient understands prevention, detection, and treatment of acute complications.  Demonstrates understanding / competency    Patient understands prevention, detection, and treatment of chronic complications.  Demonstrates understanding / competency    Patient understands how to develop strategies to address psychosocial issues.  Demonstrates understanding / competency    Patient understands how to develop  strategies to promote health/change behavior.  Demonstrates understanding / competency      Outcomes   Expected Outcomes  Demonstrated interest in learning. Expect positive outcomes    Future DMSE  PRN    Program Status  Completed       Individualized Plan for Diabetes Self-Management Training:   Learning Objective:  Patient will have a greater understanding of diabetes self-management. Patient education plan is to attend individual and/or group sessions per assessed needs and concerns.   Plan:   Patient Instructions  Plan:  Aim for 3-4 Carb Choices per meal (45-60 grams)  Aim for 0-2 Carbs per snack if hungry  Include protein in moderation with your meals and snacks Consider reading food labels  for Total Carbohydrate of foods When able consider  increasing your activity level by water walking or Arm Chair Exercises as tolerated Consider checking BG at alternate times per day 2-3 days a week   Expected Outcomes:  Demonstrated interest in learning. Expect positive outcomes  Education material provided: Food label handouts, A1C conversion sheet, Meal plan card and Carbohydrate counting sheet, Arm chair Exercise handout  If problems or questions, patient to contact team via:  Phone  Future DSME appointment: PRN

## 2017-10-11 ENCOUNTER — Other Ambulatory Visit: Payer: Self-pay

## 2017-10-11 ENCOUNTER — Other Ambulatory Visit (HOSPITAL_COMMUNITY): Payer: Medicare Other

## 2017-10-11 ENCOUNTER — Ambulatory Visit (HOSPITAL_COMMUNITY): Payer: Medicare Other | Attending: Cardiology

## 2017-10-11 DIAGNOSIS — I4891 Unspecified atrial fibrillation: Secondary | ICD-10-CM | POA: Diagnosis not present

## 2017-10-11 DIAGNOSIS — R609 Edema, unspecified: Secondary | ICD-10-CM | POA: Diagnosis not present

## 2017-10-11 DIAGNOSIS — E119 Type 2 diabetes mellitus without complications: Secondary | ICD-10-CM | POA: Insufficient documentation

## 2017-10-11 DIAGNOSIS — R0609 Other forms of dyspnea: Secondary | ICD-10-CM | POA: Insufficient documentation

## 2017-10-11 DIAGNOSIS — I10 Essential (primary) hypertension: Secondary | ICD-10-CM | POA: Diagnosis not present

## 2017-10-11 DIAGNOSIS — E785 Hyperlipidemia, unspecified: Secondary | ICD-10-CM | POA: Insufficient documentation

## 2017-10-11 DIAGNOSIS — Z6841 Body Mass Index (BMI) 40.0 and over, adult: Secondary | ICD-10-CM | POA: Insufficient documentation

## 2017-10-11 DIAGNOSIS — I2510 Atherosclerotic heart disease of native coronary artery without angina pectoris: Secondary | ICD-10-CM | POA: Insufficient documentation

## 2017-10-11 DIAGNOSIS — G4733 Obstructive sleep apnea (adult) (pediatric): Secondary | ICD-10-CM | POA: Diagnosis not present

## 2017-10-11 DIAGNOSIS — R0600 Dyspnea, unspecified: Secondary | ICD-10-CM

## 2017-10-11 MED ORDER — PERFLUTREN LIPID MICROSPHERE
1.0000 mL | INTRAVENOUS | Status: AC | PRN
  Administered 2017-10-11: 2 mL via INTRAVENOUS

## 2017-10-15 ENCOUNTER — Other Ambulatory Visit: Payer: Self-pay | Admitting: Family Medicine

## 2017-10-15 ENCOUNTER — Telehealth: Payer: Self-pay | Admitting: Internal Medicine

## 2017-10-15 NOTE — Telephone Encounter (Signed)
LMTCB for echo results 

## 2017-10-15 NOTE — Progress Notes (Signed)
ATC, NA and no option to leave msg 

## 2017-10-16 ENCOUNTER — Telehealth: Payer: Self-pay | Admitting: Internal Medicine

## 2017-10-16 ENCOUNTER — Other Ambulatory Visit (HOSPITAL_COMMUNITY): Payer: Medicare Other

## 2017-10-16 DIAGNOSIS — R0609 Other forms of dyspnea: Principal | ICD-10-CM

## 2017-10-16 DIAGNOSIS — R0600 Dyspnea, unspecified: Secondary | ICD-10-CM

## 2017-10-16 NOTE — Telephone Encounter (Signed)
Spoke with pt and notified of results per Dr. Wert. Pt verbalized understanding and denied any questions. 

## 2017-10-16 NOTE — Telephone Encounter (Signed)
Called and spoke to pt. Pt is requesting to go ahead and proceed with cardiology referral.  MW please advise. Thanks.     Notes recorded by Tanda Rockers, MD on 10/14/2017 at 5:53 AM EDT Call patient : Study shows no surprises but was technically not the best quality due to wt factors - if breathing improves off actos we can review at next ov, if not we should prob go ahead with formal cards consult for doe

## 2017-10-16 NOTE — Progress Notes (Signed)
Spoke with pt and notified of results per Dr. Wert. Pt verbalized understanding and denied any questions. 

## 2017-10-16 NOTE — Telephone Encounter (Signed)
Order has been placed. Will close this encounter.  

## 2017-10-16 NOTE — Telephone Encounter (Signed)
Fine with me dx doe 

## 2017-10-22 ENCOUNTER — Encounter: Payer: Self-pay | Admitting: Pulmonary Disease

## 2017-10-22 ENCOUNTER — Ambulatory Visit: Payer: Medicare Other | Admitting: Pulmonary Disease

## 2017-10-22 VITALS — BP 120/70 | HR 67 | Ht 71.5 in | Wt 350.0 lb

## 2017-10-22 DIAGNOSIS — G4733 Obstructive sleep apnea (adult) (pediatric): Secondary | ICD-10-CM | POA: Diagnosis not present

## 2017-10-22 NOTE — Progress Notes (Signed)
Gary Butler    [id number]    [birth year]  Primary Care Physician:Tabori, Aundra Millet, MD  Referring Physician: [name], MD [address] [place], [place] [address]  Chief complaint:   Patient with obstructive sleep apnea, compliant with CPAP therapy  HPI:  Patient with a history of obstructive sleep apnea which appears well treated with CPAP therapy Has been seen Dr. Melvyn Novas for shortness of breath-feels much better overall  Has no problems with his CPAP  Download however does show elevated AHI  History of rheumatoid arthritis  Pets: dog Occupation: Teacher/principal  No significant recent travel  Outpatient Encounter Medications as of 10/22/2017  Medication Sig  . allopurinol (ZYLOPRIM) 300 MG tablet TAKE 1 TABLET(300 MG) BY MOUTH DAILY  . aspirin 81 MG tablet Take 81 mg by mouth daily.  Marland Kitchen atorvastatin (LIPITOR) 40 MG tablet TAKE 1 TABLET(40 MG) BY MOUTH EVERY EVENING  . bisoprolol (ZEBETA) 5 MG tablet Take 1 tablet (5 mg total) by mouth daily.  . cetirizine (ZYRTEC) 10 MG tablet Take 1 tablet (10 mg total) by mouth daily.  . Cholecalciferol (VITAMIN D) 2000 UNITS tablet Take 2,000 Units by mouth daily.  . Cyanocobalamin (B-12) 2000 MCG TABS Take by mouth.  . donepezil (ARICEPT) 5 MG tablet Take 1/2 tablet daily for 1 month, then increase to 1 tablet daily  . etodolac (LODINE) 400 MG tablet Take by mouth.  . fluticasone (FLONASE) 50 MCG/ACT nasal spray Place 2 sprays into both nostrils daily.  . folic acid (FOLVITE) 1 MG tablet Take 1 mg by mouth daily.   . furosemide (LASIX) 40 MG tablet Take 1 tablet (40 mg total) by mouth daily. (Patient taking differently: Take 40 mg by mouth every other day. )  . meloxicam (MOBIC) 15 MG tablet TAKE 1 TABLET(15 MG) BY MOUTH DAILY  . methotrexate (RHEUMATREX) 2.5 MG tablet Take 15 mg by mouth once a week.   Marland Kitchen MYRBETRIQ 25 MG TB24 tablet   . nystatin ointment (MYCOSTATIN) Apply 1 application topically 2 (two) times  daily. (Patient taking differently: Apply 1 application topically 2 (two) times daily as needed. )  . omeprazole (PRILOSEC) 40 MG capsule Take 30- 60 min before your first and last meals of the day  . potassium chloride (MICRO-K) 10 MEQ CR capsule TAKE 1 CAPSULE BY MOUTH EVERY DAY  . sertraline (ZOLOFT) 50 MG tablet TAKE 1 TABLET(50 MG) BY MOUTH DAILY  . telmisartan (MICARDIS) 40 MG tablet Take 1 tablet (40 mg total) by mouth daily.  Marland Kitchen tiZANidine (ZANAFLEX) 4 MG tablet Take 1 tablet (4 mg total) by mouth every 6 (six) hours as needed for muscle spasms.  Marland Kitchen triamterene-hydrochlorothiazide (MAXZIDE) 75-50 MG tablet Take 0.5 tablets by mouth every morning.  Marland Kitchen UNABLE TO FIND Med Name: CPAP with [facility]   No facility-administered encounter medications on file as of 10/22/2017.     Allergies as of 10/22/2017 - Review Complete 10/22/2017  Allergen Reaction Noted  . Lyrica [pregabalin] Swelling 10/22/2010  . Penicillins Swelling   . Levofloxacin Rash     Past Medical History:  Diagnosis Date  . Arthritis   . Constipation   . Diabetes mellitus   . Heart murmur   . History of gout   . History of skin cancer   . Hypertension   . OSA (obstructive sleep apnea)   . Psoriatic arthritis ([place])   . Rectal fissure   . Sleep apnea   .  Tinnitus     Past Surgical History:  Procedure Laterality Date  . addenoids    . JOINT REPLACEMENT  2012   RT KNEE  . LUNG BIOPSY     20 YRS AGO  . TONSILLECTOMY    . 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 Emphysema Father   . Clotting disorder Father   . Arthritis Father   . Heart disease Mother   . Arthritis Mother   . Arthritis Maternal Grandmother   . Hypertension Maternal Grandmother   .  Diabetes Maternal Grandmother   . Arthritis Paternal Grandmother   . Diabetes Maternal Aunt   . Diabetes Paternal Aunt     Social History   Socioeconomic History  . Marital status: Married    Spouse name: Not on file  . Number of children: 2  . Years of education: Not on file  . Highest education level: Not on file  Occupational History  . 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 Financial resource strain: Not on file  . Food insecurity:    Worry: Not on file    Inability: Not on file  . Transportation needs:    Medical: Not on file    Non-medical: Not on file  Tobacco Use  . Smoking status: Never Smoker  . Smokeless tobacco: Never Used  Substance and Sexual Activity  . Alcohol use: Yes    Alcohol/week: 0.0 standard drinks    Comment: Once a month  . Drug use: No  . Sexual activity: Never  Lifestyle  . Physical activity:    Days per week: Not on file    Minutes per session: Not on file  . Stress: Not on file  Relationships  . Social connections:    Talks on phone: Not on file    Gets together: Not on file    Attends religious service: Not on file    Active member of club or organization: Not on file    Attends meetings of clubs or organizations: Not on file    Relationship status: Not on file  . Intimate partner violence:    Fear of current or ex partner: Not on file    Emotionally abused: Not on file    Physically abused: Not on file    Forced sexual activity: Not on file  Other Topics Concern  . Not on file  Social History Narrative   Alcohol- 1 to 2 per month.     Review of Systems  Respiratory: Positive for apnea and shortness of breath. Negative for cough.   Musculoskeletal: Positive for arthralgias and back pain.  Psychiatric/Behavioral: Positive for sleep disturbance.  All other systems reviewed and are negative.   Vitals:   10/22/17 1116  BP: 120/70  Pulse: 67  SpO2: 93%      Physical Exam  Constitutional: He appears well-nourished.  Obesity  HENT:  Head: Normocephalic and atraumatic.  Eyes: Pupils are equal, round, and reactive to light. Conjunctivae and EOM are normal. Right eye exhibits no discharge.  Neck: Normal range of motion. Neck supple. No tracheal deviation present. No thyromegaly present.  Cardiovascular: Normal rate and regular rhythm.  Pulmonary/Chest: Effort normal.  No respiratory distress. He has no wheezes.  Abdominal: Soft. Bowel sounds are normal. He exhibits no distension. There is no tenderness.  Musculoskeletal: Normal range of motion. He exhibits edema. He exhibits no deformity.  Neurological: He is alert.  Skin: Skin is warm and dry. No erythema.  Psychiatric: He has a normal mood and affect.     Data Reviewed: Compliance data reveals 100% compliance however AHI is 28.4  Records reviewed Recent chest x-ray reviewed by myself showing small effusion  Assessment:  Obstructive sleep apnea-very good compliance, suboptimally treated  He does have a titration study scheduled for 11/14/2017  Morbid obesity-working on weight loss  Importance of regular exercises discussed  Plan/Recommendations:  Importance of continuing to aggressively treat his sleep disordered breathing was discussed  Regular exercise is encouraged  We will increase his CPAP pressure to 16 from 15  We will follow-up with his titration study and adjust his CPAP further as recommended by the study  Encouraged to give [place] a call if any significant concerns  Encouraged to follow-up with cardiology as his most recent echocardiogram does reveal diastolic heart failure-risk for profile modification  We will schedule a follow-up in 3 months  Sherrilyn Rist MD [facility] 10/22/2017, 11:19 AM  CC: [name], MD

## 2017-10-22 NOTE — Patient Instructions (Signed)
Obstructive sleep apnea on CPAP therapy  AHI is increased on the download--28.4  Titration study scheduled for 10/3  We will go ahead and increase CPAP to 16 pending his study  Follow-up appointment in 3 months  Call if any significant concerns

## 2017-11-06 ENCOUNTER — Encounter (HOSPITAL_BASED_OUTPATIENT_CLINIC_OR_DEPARTMENT_OTHER): Payer: Medicare Other

## 2017-11-13 ENCOUNTER — Other Ambulatory Visit: Payer: Self-pay | Admitting: Family Medicine

## 2017-11-14 ENCOUNTER — Ambulatory Visit (HOSPITAL_BASED_OUTPATIENT_CLINIC_OR_DEPARTMENT_OTHER): Payer: Medicare Other | Attending: Internal Medicine | Admitting: Pulmonary Disease

## 2017-11-14 ENCOUNTER — Other Ambulatory Visit: Payer: Self-pay | Admitting: Family Medicine

## 2017-11-14 ENCOUNTER — Telehealth: Payer: Self-pay | Admitting: Family Medicine

## 2017-11-14 DIAGNOSIS — G4733 Obstructive sleep apnea (adult) (pediatric): Secondary | ICD-10-CM

## 2017-11-14 NOTE — Telephone Encounter (Signed)
Copied from [place] [id number]. Topic: Quick Communication - See Telephone Encounter >> Nov 14, 2017  8:41 AM Bea Graff, NT wrote: CRM for notification. See Telephone encounter for: 11/14/17. YKDXIP with [facility] calling to see if Dr. Birdie Riddle knows that pts pulmonologist called in telmisartan and they received a refill for ramipril. They want to clarify which one the patient should be taking as normally they do not take these together. Please advise.

## 2017-11-14 NOTE — Telephone Encounter (Signed)
Called and informed pharmacy. They are deleting the ramipril out of the system completely.

## 2017-11-14 NOTE — Telephone Encounter (Signed)
Please advise 

## 2017-11-14 NOTE — Telephone Encounter (Signed)
Pt should STOP Ramipril and CONTINUE Telmisartan.  This must have been refilled as an auto-request b/c this was removed in January

## 2017-11-15 DIAGNOSIS — G4733 Obstructive sleep apnea (adult) (pediatric): Secondary | ICD-10-CM | POA: Diagnosis not present

## 2017-11-15 NOTE — Procedures (Signed)
  Patient Name: Gary Butler, Space Date: 11/14/2017   Gender: Male  D.O.B: Jun 12, 1945  Age (years): 71  Referring Provider: Tanda Rockers  Height (inches): 71  Interpreting Physician: Kara Mead MD, ABSM  Weight (lbs): 350  RPSGT: Lanae Boast  BMI: 49  MRN: [id number]  Neck Size: 20.50  <br> <br>  CLINICAL INFORMATION  The patient is referred for a CPAP titration to treat sleep apnea. Date of NPSG: 09/1997, severe AHI 79/h corrected by CPAP 11 cm - wt 310 lbs SLEEP STUDY TECHNIQUE  As per the AASM Manual for the Scoring of Sleep and Associated Events v2.3 (April 2016) with a hypopnea requiring 4% desaturations. The channels recorded and monitored were frontal, central and occipital EEG, electrooculogram (EOG), submentalis EMG (chin), nasal and oral airflow, thoracic and abdominal wall motion, anterior tibialis EMG, snore microphone, electrocardiogram, and pulse oximetry. Continuous positive airway pressure (CPAP) was initiated at the beginning of the study and titrated to treat sleep-disordered breathing. MEDICATIONS  Medications self-administered by patient taken the night of the study : N/A TECHNICIAN COMMENTS  Comments added by technician: Patient had difficulty initiating sleep.  RESPIRATORY PARAMETERS  Optimal PAP Pressure (cm): 21 AHI at Optimal Pressure (/hr): 8.7  Overall Minimal O2 (%): 87.0 Supine % at Optimal Pressure (%): 0  Minimal O2 at Optimal Pressure (%): 90.0      SLEEP ARCHITECTURE  The study was initiated at 10:14:27 PM and ended at 6:01:44 AM. Sleep onset time was 75.4 minutes and the sleep efficiency was 73.1%%. The total sleep time was 341.5 minutes. The patient spent 6.9%% of the night in stage N1 sleep, 82.1%% in stage N2 sleep, 0.0%% in stage N3 and 11% in REM.Stage REM latency was 123.0 minutes Wake after sleep onset was 50.4. Alpha intrusion was absent. Supine sleep was 8.59%. CARDIAC DATA  The 2 lead EKG demonstrated sinus rhythm. The mean  heart rate was 67.5 beats per minute. Other EKG findings include: PVCs.  LEG MOVEMENT DATA  The total Periodic Limb Movements of Sleep (PLMS) were 0. The PLMS index was 0.0. A PLMS index of <15 is considered normal in adults. IMPRESSIONS  - The optimal PAP pressure was 21/17 cm of water. - Central sleep apnea was not noted during this titration (CAI = 1.4/h).  - Mild oxygen desaturations were observed during this titration (min O2 = 87.0%).  - The patient snored with soft snoring volume during this titration study.  - 2-lead EKG demonstrated: PVCs  - Clinically significant periodic limb movements were not noted during this study. Arousals associated with PLMs were rare. DIAGNOSIS  - Obstructive Sleep Apnea (327.23 [G47.33 ICD-10]) RECOMMENDATIONS  - Trial of biPAP therapy on 21/17 cm H2O with a Large size Resmed Nasal Mask Airfit N20 mask and heated humidification. Alternatively auto bilevel could be tried.Note that CPAP 20 cm was not adequate to control obstructive events - Avoid alcohol, sedatives and other CNS depressants that may worsen sleep apnea and disrupt normal sleep architecture.  - Sleep hygiene should be reviewed to assess factors that may improve sleep quality.  - Weight management and regular exercise should be initiated or continued.  - Return to [facility] for re-evaluation after 4 weeks of therapy   Kara Mead MD Board Certified in [place]

## 2017-11-18 ENCOUNTER — Other Ambulatory Visit: Payer: Self-pay | Admitting: Internal Medicine

## 2017-11-18 DIAGNOSIS — G4733 Obstructive sleep apnea (adult) (pediatric): Secondary | ICD-10-CM

## 2017-11-18 NOTE — Progress Notes (Signed)
Spoke with pt and notified of results per Dr. Melvyn Novas. Pt verbalized understanding and denied any questions. Order to [facility]

## 2017-11-20 ENCOUNTER — Telehealth: Payer: Self-pay | Admitting: Pulmonary Disease

## 2017-11-20 NOTE — Telephone Encounter (Signed)
Spoke with Gary Butler  She states that the pt recently received a cpap  She wants to know if the order for BIPAP is correct  I advised that we ordered the BIPAP based on the most recent sleep test  She verbalized understanding

## 2017-12-06 ENCOUNTER — Ambulatory Visit: Payer: Medicare Other | Admitting: Physician Assistant

## 2017-12-07 ENCOUNTER — Other Ambulatory Visit: Payer: Self-pay | Admitting: Family Medicine

## 2017-12-09 ENCOUNTER — Ambulatory Visit (INDEPENDENT_AMBULATORY_CARE_PROVIDER_SITE_OTHER): Payer: Medicare Other | Admitting: General Practice

## 2017-12-09 DIAGNOSIS — Z23 Encounter for immunization: Secondary | ICD-10-CM

## 2017-12-09 NOTE — Progress Notes (Signed)
Gary Butler is a 72 y.o. male presents to the office today for fluzone injections, per physician's orders. Original order: 12/09/17 Fluzone (med), 0.39mL(dose),  IM(route) was administered LD (location) today. Patient tolerated injection.  Kassity Woodson L [place] Drum

## 2017-12-10 ENCOUNTER — Other Ambulatory Visit: Payer: Self-pay | Admitting: Family Medicine

## 2017-12-12 ENCOUNTER — Encounter: Payer: Self-pay | Admitting: Cardiovascular Disease

## 2017-12-12 ENCOUNTER — Encounter: Payer: Self-pay | Admitting: Physician Assistant

## 2017-12-12 ENCOUNTER — Ambulatory Visit: Payer: Medicare Other | Admitting: Physician Assistant

## 2017-12-12 VITALS — BP 100/58 | HR 65 | Resp 16 | Ht 71.5 in | Wt 327.0 lb

## 2017-12-12 DIAGNOSIS — I350 Nonrheumatic aortic (valve) stenosis: Secondary | ICD-10-CM

## 2017-12-12 DIAGNOSIS — R0609 Other forms of dyspnea: Secondary | ICD-10-CM | POA: Diagnosis not present

## 2017-12-12 DIAGNOSIS — R0600 Dyspnea, unspecified: Secondary | ICD-10-CM

## 2017-12-12 DIAGNOSIS — G4733 Obstructive sleep apnea (adult) (pediatric): Secondary | ICD-10-CM | POA: Diagnosis not present

## 2017-12-12 DIAGNOSIS — E119 Type 2 diabetes mellitus without complications: Secondary | ICD-10-CM

## 2017-12-12 DIAGNOSIS — I10 Essential (primary) hypertension: Secondary | ICD-10-CM

## 2017-12-12 DIAGNOSIS — I5032 Chronic diastolic (congestive) heart failure: Secondary | ICD-10-CM

## 2017-12-12 NOTE — Patient Instructions (Addendum)
Medication Instructions:   HOLD Bisoprolol the morning of your stress test.  If you need a refill on your cardiac medications before your next appointment, please call your pharmacy.   Lab work: none  Testing/Procedures: Your physician has requested that you have a 2 day exercise stress myoview. For further information please visit [email or web address]. Please follow instruction sheet, as given. This test will be FASTING--nothing to eat or drink the morning of your stress test, except water to take medications.  Follow-Up: At [facility], you and your health needs are our priority.  As part of our continuing mission to provide you with exceptional heart care, we have created designated Provider Care Teams.  These Care Teams include your primary Cardiologist (physician) and Advanced Practice Providers (APPs -  Physician Assistants and Nurse Practitioners) who all work together to provide you with the care you need, when you need it. You will need a follow up appointment in 4-6 months with Dr. Claiborne Billings.     Any Other Special Instructions Will Be Listed Below (If Applicable). none

## 2017-12-12 NOTE — Progress Notes (Signed)
Cardiology Office Note    Date:  12/12/2017   ID:  Gary Butler, DOB [birth year], MRN [id number]  PCP:  Gary Minium, MD  Cardiologist:  Gary Butler   Chief Complaint  Patient presents with  . Shortness of Breath    History of Present Illness:  Gary Butler is a 72 y.o. 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  He has PMH of OSA on BiPAP, morbid obesity, history of heart murmur, DM 2 and HTN.  He has no prior history of CAD.  Myoview in 2012 was normal.  Echo obtained in May 2016 showed EF of 50 to 55%, thickened aortic valve with mild aortic stenosis, mild left atrial dilatation.  Patient was previously seen by Gary Butler on 07/04/2015 for evaluation of lower extremity edema.  His Maxide was discontinued and Lasix increased to 40 mg daily.  Echocardiogram obtained on 08/01/2015 showed EF 60 to 65%, grade 2 DD, thickened aortic valve with mild aortic stenosis.  His obstructive sleep apnea is managed by pulmonology.  Echocardiogram was ordered by Gary Butler on 10/11/2017 which showed EF 60 to 65%, suboptimal image quality due to body size even with Definity contrast, moderate LVH, EF 60 to 65%, no regional wall motion abnormality, grade 2 DD, mild aortic stenosis, mildly dilated ascending aorta measuring 40 mm.  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  Most recent sleep study obtained in October 2019, he was placed on a trial of BiPAP therapy.  Patient presents today for cardiology office visit.  In the past 6 months, he has been noticing progressive dyspnea on exertion.  He also had significant fluid retention as well and managed to lose 50 pounds since August.  Some of that is fluid, however some of that is likely true weight loss as well.  He appears to be euvolemic on physical exam.  Previous lipid panel obtained in July 2019 showed chronic low HDL, but otherwise very well-controlled cholesterol.  He does not have  any anemia based on the recent lab work.  He denies any chest pain, dizziness associated with exertion.  The only complaint he has is dyspnea after he walks several 100 feet.  He does not get short of breath with normal everyday activity at home however only gets short of breath with more extreme activity.  Even without the chest pain, I am concerned we may be dealing with anginal equivalent given the dyspnea on exertion.  I recommended to do a treadmill nuclear stress test.  If negative, however I would recommend focus on pulmonary and weight loss.   Past Medical History:  Diagnosis Date  . Arthritis   . Constipation   . Diabetes mellitus   . Heart murmur   . History of gout   . History of skin cancer   . Hypertension   . OSA (obstructive sleep apnea)   . Psoriatic arthritis ([place])   . Rectal fissure   . Sleep apnea   . Tinnitus     Past Surgical History:  Procedure Laterality Date  . addenoids    . JOINT REPLACEMENT  2012   RT KNEE  . LUNG BIOPSY     20 YRS AGO  . TONSILLECTOMY    . TOTAL KNEE ARTHROPLASTY Bilateral 06/22/2013   Procedure: LEFT TOTAL KNEE ARTHROPLASTY;  MEDIAL SOFT TISSUE EXPLORATION SAPHENOUS NEURECTOMY RIGHT KNEE;  Surgeon: Mauri Pole, MD;  Location: [facility];  Service:  Orthopedics;  Laterality: Bilateral;    Current Medications: Outpatient Medications Prior to Visit  Medication Sig Dispense Refill  . allopurinol (ZYLOPRIM) 300 MG tablet TAKE 1 TABLET(300 MG) BY MOUTH DAILY 30 tablet 6  . aspirin 81 MG tablet Take 81 mg by mouth daily.    Marland Kitchen atorvastatin (LIPITOR) 40 MG tablet TAKE 1 TABLET(40 MG) BY MOUTH EVERY EVENING 90 tablet 0  . bisoprolol (ZEBETA) 5 MG tablet Take 1 tablet (5 mg total) by mouth daily. 30 tablet 11  . cetirizine (ZYRTEC) 10 MG tablet TAKE 1 TABLET(10 MG) BY MOUTH DAILY 30 tablet 0  . Cholecalciferol (VITAMIN D) 2000 UNITS tablet Take 2,000 Units by mouth daily.    . Cyanocobalamin (B-12) 2000 MCG TABS Take by mouth.    . donepezil  (ARICEPT) 5 MG tablet Take 1/2 tablet daily for 1 month, then increase to 1 tablet daily 30 tablet 11  . etodolac (LODINE) 400 MG tablet Take by mouth.    . fluticasone (FLONASE) 50 MCG/ACT nasal spray Place 2 sprays into both nostrils daily. 16 g 6  . folic acid (FOLVITE) 1 MG tablet Take 1 mg by mouth daily.     . furosemide (LASIX) 40 MG tablet Take 1 tablet (40 mg total) by mouth daily. (Patient taking differently: Take 40 mg by mouth every other day. ) 30 tablet 0  . meloxicam (MOBIC) 15 MG tablet TAKE 1 TABLET(15 MG) BY MOUTH DAILY 90 tablet 0  . methotrexate (RHEUMATREX) 2.5 MG tablet Take 15 mg by mouth once a week.     Marland Kitchen MYRBETRIQ 25 MG TB24 tablet     . nystatin ointment (MYCOSTATIN) Apply 1 application topically 2 (two) times daily. (Patient taking differently: Apply 1 application topically 2 (two) times daily as needed. ) 90 g 1  . omeprazole (PRILOSEC) 40 MG capsule Take 30- 60 min before your first and last meals of the day 180 capsule 1  . potassium chloride (MICRO-K) 10 MEQ CR capsule TAKE 1 CAPSULE BY MOUTH EVERY DAY 90 capsule 0  . sertraline (ZOLOFT) 50 MG tablet TAKE 1 TABLET(50 MG) BY MOUTH DAILY 90 tablet 0  . telmisartan (MICARDIS) 40 MG tablet Take 1 tablet (40 mg total) by mouth daily. 30 tablet 11  . tiZANidine (ZANAFLEX) 4 MG tablet Take 1 tablet (4 mg total) by mouth every 6 (six) hours as needed for muscle spasms. 30 tablet 0  . triamterene-hydrochlorothiazide (MAXZIDE) 75-50 MG tablet Take 0.5 tablets by mouth every morning. 45 tablet 1  . UNABLE TO FIND Med Name: CPAP with [facility]    . ramipril (ALTACE) 5 MG capsule TAKE 1 CAPSULE BY MOUTH DAILY. 90 capsule 0   No facility-administered medications prior to visit.      Allergies:   Lyrica [pregabalin]; Penicillins; and Levofloxacin   Social History   Socioeconomic History  . Marital status: Married    Spouse name: Not on file  . Number of children: 2  . Years of education: Not on file  . Highest education  level: Not on file  Occupational History  . 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 Financial resource strain: Not on file  . Food insecurity:    Worry: Not on file    Inability: Not on file  . Transportation needs:    Medical: Not on file    Non-medical: Not on file  Tobacco Use  . Smoking status: Never  Smoker  . Smokeless tobacco: Never Used  Substance and Sexual Activity  . Alcohol use: Yes    Alcohol/week: 0.0 standard drinks    Comment: Once a month  . Drug use: No  . Sexual activity: Never  Lifestyle  . Physical activity:    Days per week: Not on file    Minutes per session: Not on file  . Stress: Not on file  Relationships  . Social connections:    Talks on phone: Not on file    Gets together: Not on file    Attends religious service: Not on file    Active member of club or organization: Not on file    Attends meetings of clubs or organizations: Not on file    Relationship status: Not on file  Other Topics Concern  . Not on file  Social History Narrative   Alcohol- 1 to 2 per month.      Family History:  The patient's family history includes Arthritis in his father, maternal grandmother, mother, and paternal grandmother; Clotting disorder in his father; Diabetes in his maternal aunt, maternal grandmother, and paternal aunt; Emphysema in his father; Heart disease in his mother; Hypertension in his maternal grandmother.   ROS:   Please see the history of present illness.    ROS All other systems reviewed and are negative.   PHYSICAL EXAM:   VS:  BP (!) 100/58   Pulse 65   Resp 16   Ht 5' 11.5" (1.816 m)   Wt (!) 327 lb (148.3 kg)   SpO2 95%   BMI 44.97 kg/m    GEN: Well nourished, well developed, in no acute distress  HEENT: normal  Neck: no JVD, carotid bruits, or masses Cardiac: RRR; no murmurs, rubs, or gallops,no edema  Respiratory:  clear to auscultation bilaterally, normal work of  breathing GI: soft, nontender, nondistended, + BS MS: no deformity or atrophy  Skin: warm and dry, no rash Neuro:  Alert and Oriented x 3, Strength and sensation are intact Psych: euthymic mood, full affect  Wt Readings from Last 3 Encounters:  12/12/17 (!) 327 lb (148.3 kg)  11/14/17 (!) 350 lb (158.8 kg)  10/22/17 (!) 350 lb (158.8 kg)      Studies/Labs Reviewed:   EKG:  EKG is ordered today.  The ekg ordered today demonstrates normal sinus rhythm, low voltage.  Recent Labs: 09/10/2017: ALT 12 10/04/2017: BUN 15; Creatinine, Ser 1.09; Hemoglobin 14.1; Platelets 229.0; Potassium 3.7; Pro B Natriuretic peptide (BNP) 274.0; Sodium 138; TSH 2.89   Lipid Panel    Component Value Date/Time   CHOL 96 09/10/2017 0947   TRIG 127.0 09/10/2017 0947   HDL 34.30 (L) 09/10/2017 0947   CHOLHDL 3 09/10/2017 0947   VLDL 25.4 09/10/2017 0947   LDLCALC 36 09/10/2017 0947    Additional studies/ records that were reviewed today include:   Echo 10/11/2017 LV EF: 60% -   65% Study Conclusions  - Procedure narrative: Transthoracic echocardiography. Image   quality was suboptimal. The study was technically difficult.   Intravenous contrast (Definity) was administered to opacify the   LV. - Left ventricle: The cavity size was normal. There was moderate   concentric hypertrophy. Systolic function was normal. The   estimated ejection fraction was in the range of 60% to 65%. Wall   motion was normal; there were no regional wall motion   abnormalities. Features are consistent with a pseudonormal left   ventricular filling pattern, with concomitant abnormal relaxation  and increased filling pressure (grade 2 diastolic dysfunction).   Doppler parameters are consistent with high ventricular filling   pressure. - Aortic valve: There was mild stenosis. - Aorta: Ascending aortic diameter: 40 mm (S). - Ascending aorta: The ascending aorta was mildly dilated. - Pulmonary arteries: Systolic pressure  could not be accurately   estimated.  Impressions:  - Overall normal LVF with normal wall motion. Chamber measurments   inaccurate due to off access images but there appears to be at   least mild to moderate LVH. Mildly dilated ascending aorta. Mild   AS. Grade 2 DD.    ASSESSMENT:    1. Dyspnea on exertion   2. Morbid obesity ([place])   3. OSA treated with BiPAP   4. Nonrheumatic aortic valve stenosis   5. Essential hypertension   6. Controlled type 2 diabetes mellitus without complication, without long-term current use of insulin ([place])   7. Chronic diastolic heart failure (H[facility])      PLAN:  In order of problems listed above:  1. Dyspnea on exertion: Concerning for anginal equivalent, recommend to do a 2 day treadmill nuclear stress test.  He has been informed to hold bisoprolol in the morning of the stress test.  2. Chronic diastolic heart failure: According to patient, he underwent significant diuresis in the last 2 months.  He appears to be euvolemic on today's physical exam.  I do not recommend changing the diuretic at this time.  3. Morbid obesity: Likely the biggest contributor to his dyspnea on exertion.  Fortunately he has lost roughly 50 pounds.  4. Pretension: Blood pressure very well controlled  5. DM2: Managed by primary care provider  6. Aortic stenosis: Quite mild on echocardiogram in 2016, 2017 and the most recently 2019.  Unlikely to have contributed to his symptoms.    Medication Adjustments/Labs and Tests Ordered: Current medicines are reviewed at length with the patient today.  Concerns regarding medicines are outlined above.  Medication changes, Labs and Tests ordered today are listed in the Patient Instructions below. Patient Instructions  Medication Instructions:   HOLD Bisoprolol the morning of your stress test.  If you need a refill on your cardiac medications before your next appointment, please call your pharmacy.   Lab  work: none  Testing/Procedures: Your physician has requested that you have a 2 day exercise stress myoview. For further information please visit [email or web address]. Please follow instruction sheet, as given. This test will be FASTING--nothing to eat or drink the morning of your stress test, except water to take medications.  Follow-Up: At [facility], you and your health needs are our priority.  As part of our continuing mission to provide you with exceptional heart care, we have created designated Provider Care Teams.  These Care Teams include your primary Cardiologist (physician) and Advanced Practice Providers (APPs -  Physician Assistants and Nurse Practitioners) who all work together to provide you with the care you need, when you need it. You will need a follow up appointment in 4-6 months with Gary Butler.     Any Other Special Instructions Will Be Listed Below (If Applicable). none      Hilbert Corrigan, [place]  12/12/2017 8:51 AM    [address], [place], [place]  [address] Phone: [phone]; Fax: [phone]

## 2017-12-14 ENCOUNTER — Other Ambulatory Visit: Payer: Self-pay | Admitting: Family Medicine

## 2017-12-19 ENCOUNTER — Telehealth (HOSPITAL_COMMUNITY): Payer: Self-pay

## 2017-12-19 NOTE — Telephone Encounter (Signed)
Encounter complete. 

## 2017-12-24 ENCOUNTER — Telehealth (HOSPITAL_COMMUNITY): Payer: Self-pay

## 2017-12-24 ENCOUNTER — Inpatient Hospital Stay (HOSPITAL_COMMUNITY): Admission: RE | Admit: 2017-12-24 | Payer: Medicare Other | Source: Ambulatory Visit

## 2017-12-24 NOTE — Telephone Encounter (Signed)
Encounter complete. 

## 2017-12-25 ENCOUNTER — Inpatient Hospital Stay (HOSPITAL_COMMUNITY): Admission: RE | Admit: 2017-12-25 | Payer: Medicare Other | Source: Ambulatory Visit

## 2017-12-31 ENCOUNTER — Telehealth (HOSPITAL_COMMUNITY): Payer: Self-pay

## 2017-12-31 NOTE — Telephone Encounter (Signed)
Encounter complete. 

## 2018-01-02 ENCOUNTER — Ambulatory Visit (HOSPITAL_COMMUNITY)
Admission: RE | Admit: 2018-01-02 | Discharge: 2018-01-02 | Disposition: A | Discharge status: Home / Self Care | Payer: Medicare Other | Source: Ambulatory Visit | Attending: Cardiovascular Disease | Admitting: Cardiovascular Disease

## 2018-01-02 DIAGNOSIS — R0609 Other forms of dyspnea: Secondary | ICD-10-CM

## 2018-01-02 DIAGNOSIS — R0600 Dyspnea, unspecified: Secondary | ICD-10-CM

## 2018-01-02 MED ORDER — TECHNETIUM TC 99M TETROFOSMIN IV KIT
31.4000 | PACK | Freq: Once | INTRAVENOUS | Status: AC | PRN
  Administered 2018-01-02: 31.4 via INTRAVENOUS
  Filled 2018-01-02: qty 32

## 2018-01-02 MED ORDER — REGADENOSON 0.4 MG/5ML IV SOLN
0.4000 mg | Freq: Once | INTRAVENOUS | Status: AC
  Administered 2018-01-02: 0.4 mg via INTRAVENOUS

## 2018-01-03 ENCOUNTER — Ambulatory Visit (HOSPITAL_COMMUNITY)
Admission: RE | Admit: 2018-01-03 | Discharge: 2018-01-03 | Disposition: A | Discharge status: Home / Self Care | Payer: Medicare Other | Source: Ambulatory Visit | Attending: Cardiology | Admitting: Cardiology

## 2018-01-03 LAB — MYOCARDIAL PERFUSION IMAGING
LV dias vol: 100 mL (ref 62–150)
LV sys vol: 41 mL
Peak HR: 88 {beats}/min
Rest HR: 67 {beats}/min
SDS: 2
SRS: 0
SSS: 2
TID: 0.93

## 2018-01-03 MED ORDER — TECHNETIUM TC 99M TETROFOSMIN IV KIT
29.1000 | PACK | Freq: Once | INTRAVENOUS | Status: AC | PRN
  Administered 2018-01-03: 29.1 via INTRAVENOUS

## 2018-01-13 ENCOUNTER — Other Ambulatory Visit: Payer: Self-pay | Admitting: Family Medicine

## 2018-01-21 ENCOUNTER — Ambulatory Visit: Payer: Medicare Other | Admitting: Pulmonary Disease

## 2018-01-21 ENCOUNTER — Encounter: Payer: Self-pay | Admitting: Pulmonary Disease

## 2018-01-21 VITALS — BP 138/66 | HR 79 | Ht 71.0 in | Wt 351.4 lb

## 2018-01-21 DIAGNOSIS — Z9989 Dependence on other enabling machines and devices: Secondary | ICD-10-CM

## 2018-01-21 DIAGNOSIS — G4733 Obstructive sleep apnea (adult) (pediatric): Secondary | ICD-10-CM

## 2018-01-21 DIAGNOSIS — R0602 Shortness of breath: Secondary | ICD-10-CM | POA: Diagnosis not present

## 2018-01-21 NOTE — Patient Instructions (Signed)
Obstructive sleep apnea adequately treated with BiPAP Very good compliance, improvement in symptoms  No changes to be made today  I will see you back in the office in about 6 months Call with any significant concerns

## 2018-01-21 NOTE — Progress Notes (Signed)
Gary Butler    [id number]    June 02, 1945  Primary Care Physician:Tabori, Aundra Millet, MD  Referring Physician: [name], MD [address] [place], [place] [address]  Chief complaint:   Patient with obstructive sleep apnea Compliant with BiPAP therapy  HPI:  Patient with a history of obstructive sleep apnea which appears well treated with CPAP therapy he was having problems with CPAP tolerance and was subsequently switched to BiPAP of 21/17 Symptoms have improved remarkably Compliance is excellent  Is compliant on BiPAP of 21/17 is 100%, residual AHI of 6.8, overall, no significant leaks  He is being followed by Dr. Melvyn Novas for shortness of breath-feels much better overall  History of rheumatoid arthritis  Pets: dog Occupation: Teacher/principal  No significant recent travel  Outpatient Encounter Medications as of 01/21/2018  Medication Sig  . allopurinol (ZYLOPRIM) 300 MG tablet TAKE 1 TABLET(300 MG) BY MOUTH DAILY  . aspirin 81 MG tablet Take 81 mg by mouth daily.  Marland Kitchen atorvastatin (LIPITOR) 40 MG tablet TAKE 1 TABLET(40 MG) BY MOUTH EVERY EVENING  . bisoprolol (ZEBETA) 5 MG tablet Take 1 tablet (5 mg total) by mouth daily.  . cetirizine (ZYRTEC) 10 MG tablet TAKE 1 TABLET(10 MG) BY MOUTH DAILY  . Cholecalciferol (VITAMIN D) 2000 UNITS tablet Take 2,000 Units by mouth daily.  . Cyanocobalamin (B-12) 2000 MCG TABS Take by mouth.  . donepezil (ARICEPT) 5 MG tablet Take 1/2 tablet daily for 1 month, then increase to 1 tablet daily  . etodolac (LODINE) 400 MG tablet Take by mouth.  . fluticasone (FLONASE) 50 MCG/ACT nasal spray Place 2 sprays into both nostrils daily.  . folic acid (FOLVITE) 1 MG tablet Take 1 mg by mouth daily.   . furosemide (LASIX) 40 MG tablet Take 1 tablet (40 mg total) by mouth daily. (Patient taking differently: Take 40 mg by mouth every other day. )  . meloxicam (MOBIC) 15 MG tablet TAKE 1 TABLET(15 MG) BY MOUTH DAILY  .  methotrexate (RHEUMATREX) 2.5 MG tablet Take 15 mg by mouth once a week.   Marland Kitchen MYRBETRIQ 25 MG TB24 tablet   . nystatin ointment (MYCOSTATIN) Apply 1 application topically 2 (two) times daily. (Patient taking differently: Apply 1 application topically 2 (two) times daily as needed. )  . omeprazole (PRILOSEC) 40 MG capsule Take 30- 60 min before your first and last meals of the day  . pioglitazone (ACTOS) 30 MG tablet TAKE 1 TABLET BY MOUTH DAILY  . potassium chloride (MICRO-K) 10 MEQ CR capsule TAKE 1 CAPSULE BY MOUTH EVERY DAY  . potassium chloride (MICRO-K) 10 MEQ CR capsule TAKE 1 CAPSULE BY MOUTH EVERY DAY  . sertraline (ZOLOFT) 50 MG tablet TAKE 1 TABLET(50 MG) BY MOUTH DAILY  . telmisartan (MICARDIS) 40 MG tablet Take 1 tablet (40 mg total) by mouth daily.  Marland Kitchen tiZANidine (ZANAFLEX) 4 MG tablet Take 1 tablet (4 mg total) by mouth every 6 (six) hours as needed for muscle spasms.  Marland Kitchen triamterene-hydrochlorothiazide (MAXZIDE) 75-50 MG tablet Take 0.5 tablets by mouth every morning.  Marland Kitchen UNABLE TO FIND Med Name: CPAP with [facility]   No facility-administered encounter medications on file as of 01/21/2018.     Allergies as of 01/21/2018 - Review Complete 01/21/2018  Allergen Reaction Noted  . Lyrica [pregabalin] Swelling 10/22/2010  . Penicillins Swelling   . Levofloxacin Rash     Past Medical History:  Diagnosis Date  . Arthritis   .  Constipation   . Diabetes mellitus   . Heart murmur   . History of gout   . History of skin cancer   . Hypertension   . OSA (obstructive sleep apnea)   . Psoriatic arthritis ([place])   . Rectal fissure   . Sleep apnea   . Tinnitus     Past Surgical History:  Procedure Laterality Date  . addenoids    . JOINT REPLACEMENT  2012   RT KNEE  . LUNG BIOPSY     20 YRS AGO  . TONSILLECTOMY    . TOTAL KNEE ARTHROPLASTY Bilateral 06/22/2013   Procedure: LEFT TOTAL KNEE ARTHROPLASTY;  MEDIAL SOFT TISSUE EXPLORATION SAPHENOUS NEURECTOMY RIGHT KNEE;  Surgeon:  Mauri Pole, MD;  Location: [facility];  Service: Orthopedics;  Laterality: Bilateral;    Family History  Problem Relation Age of Onset  . Emphysema Father   . Clotting disorder Father   . Arthritis Father   . Heart disease Mother   . Arthritis Mother   . Arthritis Maternal Grandmother   . Hypertension Maternal Grandmother   . Diabetes Maternal Grandmother   . Arthritis Paternal Grandmother   . Diabetes Maternal Aunt   . Diabetes Paternal Aunt     Social History   Socioeconomic History  . Marital status: Married    Spouse name: Not on file  . Number of children: 2  . Years of education: Not on file  . Highest education level: Not on file  Occupational History  . 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 Financial resource strain: Not on file  . Food insecurity:    Worry: Not on file    Inability: Not on file  . Transportation needs:    Medical: Not on file    Non-medical: Not on file  Tobacco Use  . Smoking status: Never Smoker  . Smokeless tobacco: Never Used  Substance and Sexual Activity  . Alcohol use: Yes    Alcohol/week: 0.0 standard drinks    Comment: Once a month  . Drug use: No  . Sexual activity: Never  Lifestyle  . Physical activity:    Days per week: Not on file    Minutes per session: Not on file  . Stress: Not on file  Relationships  . Social connections:    Talks on phone: Not on file    Gets together: Not on file    Attends religious service: Not on file    Active member of club or organization: Not on file    Attends meetings of clubs or organizations: Not on file    Relationship status: Not on file  . Intimate partner violence:    Fear of current or ex partner: Not on file    Emotionally abused: Not on file    Physically abused: Not on file    Forced sexual activity: Not on file  Other Topics Concern  . Not on file  Social History Narrative   Alcohol- 1 to 2 per month.      Review of Systems  HENT: Negative.   Respiratory: Positive for apnea and shortness of breath. Negative for cough.   Musculoskeletal: Positive for arthralgias and back pain.  Psychiatric/Behavioral: Positive for sleep disturbance.  All other systems reviewed and are negative.   Vitals:   01/21/18 1010  BP: 138/66  Pulse: 79  SpO2: 92%     Physical Exam  Constitutional:  He appears well-developed and well-nourished.  Obesity  HENT:  Head: Normocephalic and atraumatic.  Mallampati 3  Eyes: Pupils are equal, round, and reactive to light. Conjunctivae and EOM are normal. Right eye exhibits no discharge.  Neck: Normal range of motion. Neck supple. No tracheal deviation present. No thyromegaly present.  Cardiovascular: Normal rate and regular rhythm.  Pulmonary/Chest: Effort normal. No respiratory distress. He has no wheezes.  Abdominal: Soft. Bowel sounds are normal. He exhibits no distension. There is no tenderness.  Musculoskeletal: Normal range of motion. He exhibits edema. He exhibits no deformity.     Data Reviewed: Compliance data reveals 100% compliance, AHI of 6.8 compared to 28.4 while he was on CPAP  Results of the [place] flowsheet 01/21/2018  Sitting and reading 0  Watching TV 0  Sitting, inactive in a public place (e.g. a theatre or a meeting) 1  As a passenger in a car for an hour without a break 0  Lying down to rest in the afternoon when circumstances permit 0  Sitting and talking to someone 0  Sitting quietly after a lunch without alcohol 2  In a car, while stopped for a few minutes in traffic 0  Total score 3   Recent titration study from 11/14/2017 reviewed Records reviewed   Assessment:  Obstructive sleep apnea-very good compliance, optimally treated with BiPAP of 21/17  Morbid obesity-working on weight loss  Importance of regular exercises discussed  Plan/Recommendations: Continue BiPAP at current pressures  Regular exercise is encouraged,  importance of weight loss and increase activity discussed Continue BiPAP 21/17  Encouraged to give [place] a call if any significant concerns  We will schedule a follow-up in 6 months  Sherrilyn Rist MD [facility] 01/21/2018, 10:21 AM  CC: [name], MD

## 2018-01-22 NOTE — Progress Notes (Addendum)
Subjective:   Gary Butler is a 72 y.o. male who presents for Medicare Annual/Subsequent preventive examination.  Review of Systems:  No ROS.  Medicare Wellness Visit. Additional risk factors are reflected in the social history.  Cardiac Risk Factors include: advanced age (>64men, >35 women);diabetes mellitus;dyslipidemia;male gender;hypertension;sedentary lifestyle;obesity (BMI >30kg/m2);family history of premature cardiovascular disease   Sleep patterns: Sleeps 7 hours, uses BIPAP.  Home Safety/Smoke Alarms: Feels safe in home. Smoke alarms in place.  Living environment; residence and Firearm Safety: Lives with wife in 1 story home with ramp.  Seat Belt Safety/Bike Helmet: Wears seat belt.   Male:   CCS-Colonoscopy 08/06/2017, Diverticulosis. Recall 5 years.   PSA-  Lab Results  Component Value Date   PSA 1.07 02/20/2016   PSA 0.90 02/17/2014       Objective:    Vitals: BP 112/64 (BP Location: Left Arm, Patient Position: Sitting, Cuff Size: Normal)   Pulse (!) 58   Temp 98.1 F (36.7 C) (Temporal)   Resp 18   Ht 6' (1.829 m)   Wt (!) 352 lb 6 oz (159.8 kg)   SpO2 98%   BMI 47.79 kg/m   Body mass index is 47.79 kg/m.  Advanced Directives 01/23/2018 11/14/2017 03/15/2015 07/19/2014 06/22/2013 06/17/2013 06/10/2013  Does Patient Have a Medical Advance Directive? Yes Yes Yes Yes Patient has advance directive, copy not in chart Patient has advance directive, copy not in chart Patient has advance directive, copy not in chart  Type of Advance Directive [place];Living will [place];Out of facility DNR (pink MOST or yellow form);Living will [place];Living will [place];Living will Living will;Healthcare Power of Attorney Living will;Healthcare Power of [place];Living will  Does patient want to make changes to medical advance directive? - No - Patient declined - - - - -  Copy of  [place] in Chart? No - copy requested No - copy requested - Yes Copy requested from family - Copy requested from family    Tobacco Social History   Tobacco Use  Smoking Status Never Smoker  Smokeless Tobacco Never Used     Counseling given: Not Answered    Past Medical History:  Diagnosis Date  . Arthritis   . Constipation   . Diabetes mellitus   . Heart murmur   . History of gout   . History of skin cancer   . Hypertension   . OSA (obstructive sleep apnea)   . Psoriatic arthritis ([place])   . Rectal fissure   . Sleep apnea   . Tinnitus    Past Surgical History:  Procedure Laterality Date  . addenoids    . JOINT REPLACEMENT  2012   RT KNEE  . LUNG BIOPSY     20 YRS AGO  . TONSILLECTOMY    . 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 Emphysema Father   . Clotting disorder Father   . Arthritis Father   . Heart disease Mother   . Arthritis Mother   . Arthritis Maternal Grandmother   . Hypertension Maternal Grandmother   . Diabetes Maternal Grandmother   . Arthritis Paternal Grandmother   . Diabetes Maternal Aunt   . Diabetes Paternal  Aunt    Social History   Socioeconomic History  . Marital status: Married    Spouse name: Not on file  . Number of children: 2  . Years of education: Not on file  . Highest education level: Not on file  Occupational History  . 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 Financial resource strain: Not on file  . Food insecurity:    Worry: Not on file    Inability: Not on file  . Transportation needs:    Medical: Not on file    Non-medical: Not on file  Tobacco Use  . Smoking status: Never  Smoker  . Smokeless tobacco: Never Used  Substance and Sexual Activity  . Alcohol use: Yes    Alcohol/week: 0.0 standard drinks    Comment: Once a month  . Drug use: No  . Sexual activity: Never  Lifestyle  . Physical activity:    Days per week: Not on file    Minutes per session: Not on file  . Stress: Not on file  Relationships  . Social connections:    Talks on phone: Not on file    Gets together: Not on file    Attends religious service: Not on file    Active member of club or organization: Not on file    Attends meetings of clubs or organizations: Not on file    Relationship status: Not on file  Other Topics Concern  . Not on file  Social History Narrative   Alcohol- 1 to 2 per month.     Outpatient Encounter Medications as of 01/23/2018  Medication Sig  . allopurinol (ZYLOPRIM) 300 MG tablet TAKE 1 TABLET(300 MG) BY MOUTH DAILY  . aspirin 81 MG tablet Take 81 mg by mouth daily.  Marland Kitchen atorvastatin (LIPITOR) 40 MG tablet TAKE 1 TABLET(40 MG) BY MOUTH EVERY EVENING  . bisoprolol (ZEBETA) 5 MG tablet Take 1 tablet (5 mg total) by mouth daily.  . cetirizine (ZYRTEC) 10 MG tablet TAKE 1 TABLET(10 MG) BY MOUTH DAILY  . Cholecalciferol (VITAMIN D) 2000 UNITS tablet Take 2,000 Units by mouth daily.  . Cyanocobalamin (B-12) 2000 MCG TABS Take by mouth.  . donepezil (ARICEPT) 5 MG tablet Take 1/2 tablet daily for 1 month, then increase to 1 tablet daily  . etodolac (LODINE) 400 MG tablet Take by mouth.  . fluticasone (FLONASE) 50 MCG/ACT nasal spray Place 2 sprays into both nostrils daily.  . folic acid (FOLVITE) 1 MG tablet Take 1 mg by mouth daily.   . furosemide (LASIX) 40 MG tablet Take 1 tablet (40 mg total) by mouth daily. (Patient taking differently: Take 40 mg by mouth every other day. )  . meloxicam (MOBIC) 15 MG tablet TAKE 1 TABLET(15 MG) BY MOUTH DAILY  . methotrexate (RHEUMATREX) 2.5 MG tablet Take 15 mg by mouth once a week.   Marland Kitchen MYRBETRIQ 25 MG TB24 tablet   .  nystatin ointment (MYCOSTATIN) Apply 1 application topically 2 (two) times daily. (Patient taking differently: Apply 1 application topically 2 (two) times daily as needed. )  . omeprazole (PRILOSEC) 40 MG capsule Take 30- 60 min before your first and last meals of the day  . potassium chloride (MICRO-K) 10 MEQ CR capsule TAKE 1 CAPSULE BY MOUTH EVERY DAY  . sertraline (ZOLOFT) 50 MG tablet TAKE 1 TABLET(50 MG) BY MOUTH DAILY  . telmisartan (MICARDIS) 40 MG tablet Take 1 tablet (  40 mg total) by mouth daily.  Marland Kitchen tiZANidine (ZANAFLEX) 4 MG tablet Take 1 tablet (4 mg total) by mouth every 6 (six) hours as needed for muscle spasms.  Marland Kitchen triamterene-hydrochlorothiazide (MAXZIDE) 75-50 MG tablet Take 0.5 tablets by mouth every morning.  Marland Kitchen UNABLE TO FIND Med Name: BiPap with [employer]  . pioglitazone (ACTOS) 30 MG tablet TAKE 1 TABLET BY MOUTH DAILY  . Zoster Vaccine Adjuvanted [facility]) injection Inject 0.5 mLs into the muscle once for 1 dose.  . [DISCONTINUED] potassium chloride (MICRO-K) 10 MEQ CR capsule TAKE 1 CAPSULE BY MOUTH EVERY DAY   No facility-administered encounter medications on file as of 01/23/2018.     Activities of Daily Living In your present state of health, do you have any difficulty performing the following activities: 01/23/2018  Hearing? N  Vision? N  Difficulty concentrating or making decisions? Y  Walking or climbing stairs? Y  Comment with cane  Dressing or bathing? N  Doing errands, shopping? N  Preparing Food and eating ? N  Using the Toilet? N  In the past six months, have you accidently leaked urine? N  Do you have problems with loss of bowel control? N  Managing your Medications? N  Managing your Finances? N  Housekeeping or managing your Housekeeping? N  Some recent data might be hidden    Patient Care Team: [name], MD as PCP - General (Family Medicine) [place], Jennefer Bravo, MD as Referring Physician (Dermatology) Sharyne Peach, MD as  Consulting Physician (Ophthalmology) Hennie Duos, MD as Consulting Physician (Rheumatology) Cameron Sprang, MD as Consulting Physician (Neurology) Kathie Rhodes, MD as Consulting Physician (Urology) Tanda Rockers, MD as Consulting Physician (Pulmonary Disease) Troy Sine, MD as Consulting Physician (Cardiology) Paralee Cancel, MD as Consulting Physician (Orthopedic Surgery)   Assessment:   This is a routine wellness examination for Satoru.  Exercise Activities and Dietary recommendations Current Exercise Habits: The patient does not participate in regular exercise at present, Exercise limited by: orthopedic condition(s)   Diet (meal preparation, eat out, water intake, caffeinated beverages, dairy products, fruits and vegetables): Drinks diet soda, hot tea and water.   Breakfast: frozen waffle, bacon; cereal; hot tea Lunch: hot dog/hamburger; leftovers Dinner: protein and vegetables.   Goals    . Weight (lb) < 330 lb (149.7 kg)     Lose weight by weight watchers lifestyle.        Fall Risk Fall Risk  01/23/2018 10/09/2017 01/17/2017 09/27/2016 03/05/2016  Falls in the past year? 0 No No No No    Depression Screen PHQ 2/9 Scores 01/23/2018 10/09/2017 01/17/2017 09/27/2016  PHQ - 2 Score 0 0 0 0  PHQ- 9 Score - - 0 0  Exception Documentation - - - -    Cognitive Function MMSE - Mini Mental State Exam 01/23/2018  Orientation to time 5  Orientation to Place 5  Registration 3  Attention/ Calculation 5  Recall 1  Language- name 2 objects 2  Language- repeat 1  Language- follow 3 step command 3  Language- read & follow direction 1  Write a sentence 1  Copy design 1  Total score 28   Montreal Cognitive Assessment  09/02/2015 12/27/2014  Visuospatial/ Executive (0/5) 5 5  Naming (0/3) 3 3  Attention: Read list of digits (0/2) 2 2  Attention: Read list of letters (0/1) 1 1  Attention: Serial 7 subtraction starting at 100 (0/3) 3 3  Language: Repeat phrase (0/2) 2 2  Language : Fluency (0/1) 1 0  Abstraction (0/2) 2 2  Delayed Recall (0/5) 3 4  Orientation (0/6) 6 6  Total 28 28  Adjusted Score (based on education) 28 28      Immunization History  Administered Date(s) Administered  . Influenza, High Dose Seasonal PF 12/09/2017  . Influenza,inj,Quad PF,6+ Mos 12/04/2013, 02/10/2015, 12/13/2015, 11/28/2016  . Influenza-Unspecified 11/22/2010, 12/14/2011, 11/11/2012  . Pneumococcal Conjugate-13 03/27/2013  . Pneumococcal Polysaccharide-23 10/17/2008, 02/20/2016  . Td 02/17/2014  . Zoster 11/23/2009     Screening Tests Health Maintenance  Topic Date Due  . Hepatitis C Screening  01/24/2019 (Originally [birth year])  . HEMOGLOBIN A1C  03/13/2018  . OPHTHALMOLOGY EXAM  04/27/2018  . FOOT EXAM  12/14/2018  . TETANUS/TDAP  02/18/2024  . COLONOSCOPY  08/07/2027  . INFLUENZA VACCINE  Completed  . PNA vac Low Risk Adult  Completed        Plan:     Shingles vaccine at pharmacy.   Bring a copy of your living will and/or healthcare power of attorney to your next office visit.  Continue doing brain stimulating activities (puzzles, reading, adult coloring books, staying active) to keep memory sharp.   I have personally reviewed and noted the following in the patient's chart:   . Medical and social history . Use of alcohol, tobacco or illicit drugs  . Current medications and supplements . Functional ability and status . Nutritional status . Physical activity . Advanced directives . List of other physicians . Hospitalizations, surgeries, and ER visits in previous 12 months . Vitals . Screenings to include cognitive, depression, and falls . Referrals and appointments  In addition, I have reviewed and discussed with patient certain preventive protocols, quality metrics, and best practice recommendations. A written personalized care plan for preventive services as well as general preventive health recommendations were provided to patient.      Gerilyn Nestle, RN  01/23/2018   Reviewed documentation provided by RN and agree w/ above.  Annye Asa, MD

## 2018-01-23 ENCOUNTER — Ambulatory Visit (INDEPENDENT_AMBULATORY_CARE_PROVIDER_SITE_OTHER): Payer: Medicare Other

## 2018-01-23 ENCOUNTER — Ambulatory Visit (INDEPENDENT_AMBULATORY_CARE_PROVIDER_SITE_OTHER): Payer: Medicare Other | Admitting: Family Medicine

## 2018-01-23 ENCOUNTER — Encounter: Payer: Self-pay | Admitting: Family Medicine

## 2018-01-23 ENCOUNTER — Other Ambulatory Visit: Payer: Self-pay

## 2018-01-23 VITALS — BP 112/64 | HR 58 | Temp 98.1°F | Resp 18 | Ht 72.0 in | Wt 352.4 lb

## 2018-01-23 DIAGNOSIS — Z23 Encounter for immunization: Secondary | ICD-10-CM

## 2018-01-23 DIAGNOSIS — L4050 Arthropathic psoriasis, unspecified: Secondary | ICD-10-CM | POA: Diagnosis not present

## 2018-01-23 DIAGNOSIS — Z0000 Encounter for general adult medical examination without abnormal findings: Secondary | ICD-10-CM

## 2018-01-23 DIAGNOSIS — E119 Type 2 diabetes mellitus without complications: Secondary | ICD-10-CM | POA: Diagnosis not present

## 2018-01-23 LAB — CBC WITH DIFFERENTIAL/PLATELET
Basophils Absolute: 0.1 10*3/uL (ref 0.0–0.1)
Basophils Relative: 1.5 % (ref 0.0–3.0)
Eosinophils Absolute: 0.3 10*3/uL (ref 0.0–0.7)
Eosinophils Relative: 5 % (ref 0.0–5.0)
HCT: 42.8 % (ref 39.0–52.0)
Hemoglobin: 14.5 g/dL (ref 13.0–17.0)
Lymphocytes Relative: 16.9 % (ref 12.0–46.0)
Lymphs Abs: 0.9 10*3/uL (ref 0.7–4.0)
MCHC: 33.8 g/dL (ref 30.0–36.0)
MCV: 93.3 fl (ref 78.0–100.0)
Monocytes Absolute: 0.4 10*3/uL (ref 0.1–1.0)
Monocytes Relative: 7.1 % (ref 3.0–12.0)
Neutro Abs: 3.7 10*3/uL (ref 1.4–7.7)
Neutrophils Relative %: 69.5 % (ref 43.0–77.0)
Platelets: 192 10*3/uL (ref 150.0–400.0)
RBC: 4.59 Mil/uL (ref 4.22–5.81)
RDW: 17.1 % — ABNORMAL HIGH (ref 11.5–15.5)
WBC: 5.3 10*3/uL (ref 4.0–10.5)

## 2018-01-23 LAB — LIPID PANEL
Cholesterol: 97 mg/dL (ref 0–200)
HDL: 32.1 mg/dL — ABNORMAL LOW (ref >39.00)
LDL Cholesterol: 46 mg/dL (ref 0–99)
NonHDL: 65.32
Total CHOL/HDL Ratio: 3
Triglycerides: 98 mg/dL (ref 0.0–149.0)
VLDL: 19.6 mg/dL (ref 0.0–40.0)

## 2018-01-23 LAB — BASIC METABOLIC PANEL
BUN: 18 mg/dL (ref 6–23)
CO2: 31 mEq/L (ref 19–32)
Calcium: 9.1 mg/dL (ref 8.4–10.5)
Chloride: 102 mEq/L (ref 96–112)
Creatinine, Ser: 1.02 mg/dL (ref 0.40–1.50)
GFR: 76.05 mL/min (ref >60.00)
Glucose, Bld: 165 mg/dL — ABNORMAL HIGH (ref 70–99)
Potassium: 3.9 mEq/L (ref 3.5–5.1)
Sodium: 139 mEq/L (ref 135–145)

## 2018-01-23 LAB — TSH: TSH: 3.1 u[IU]/mL (ref 0.35–4.50)

## 2018-01-23 LAB — HEPATIC FUNCTION PANEL
ALT: 15 U/L (ref 0–53)
AST: 17 U/L (ref 0–37)
Albumin: 3.6 g/dL (ref 3.5–5.2)
Alkaline Phosphatase: 100 U/L (ref 39–117)
Bilirubin, Direct: 0.3 mg/dL (ref 0.0–0.3)
Total Bilirubin: 1.1 mg/dL (ref 0.2–1.2)
Total Protein: 5.5 g/dL — ABNORMAL LOW (ref 6.0–8.3)

## 2018-01-23 LAB — HEMOGLOBIN A1C: Hgb A1c MFr Bld: 7.7 % — ABNORMAL HIGH (ref 4.6–6.5)

## 2018-01-23 MED ORDER — ZOSTER VAC RECOMB ADJUVANTED 50 MCG/0.5ML IM SUSR: 0.5000 mL | Freq: Once | INTRAMUSCULAR | 1 refills | Status: AC

## 2018-01-23 NOTE — Assessment & Plan Note (Signed)
Chronic problem.  Currently asymptomatic.  UTD on eye exam, foot exam.  On ACE for renal protection.  Stressed need for healthy diet and regular exercise.  Check labs.  Adjust meds prn

## 2018-01-23 NOTE — Patient Instructions (Addendum)
Shingles vaccine at pharmacy.   Bring a copy of your living will and/or healthcare power of attorney to your next office visit.  Continue doing brain stimulating activities (puzzles, reading, adult coloring books, staying active) to keep memory sharp.    Health Maintenance, Male A healthy lifestyle and preventive care is important for your health and wellness. Ask your health care provider about what schedule of regular examinations is right for you. What should I know about weight and diet? Eat a Healthy Diet  Eat plenty of vegetables, fruits, whole grains, low-fat dairy products, and lean protein.  Do not eat a lot of foods high in solid fats, added sugars, or salt.  Maintain a Healthy Weight Regular exercise can help you achieve or maintain a healthy weight. You should:  Do at least 150 minutes of exercise each week. The exercise should increase your heart rate and make you sweat (moderate-intensity exercise).  Do strength-training exercises at least twice a week.  Watch Your Levels of Cholesterol and Blood Lipids  Have your blood tested for lipids and cholesterol every 5 years starting at 73 years of age. If you are at high risk for heart disease, you should start having your blood tested when you are 73 years old. You may need to have your cholesterol levels checked more often if: ? Your lipid or cholesterol levels are high. ? You are older than 73 years of age. ? You are at high risk for heart disease.  What should I know about cancer screening? Many types of cancers can be detected early and may often be prevented. Lung Cancer  You should be screened every year for lung cancer if: ? You are a current smoker who has smoked for at least 30 years. ? You are a former smoker who has quit within the past 15 years.  Talk to your health care provider about your screening options, when you should start screening, and how often you should be screened.  Colorectal  Cancer  Routine colorectal cancer screening usually begins at 73 years of age and should be repeated every 5-10 years until you are 73 years old. You may need to be screened more often if early forms of precancerous polyps or small growths are found. Your health care provider may recommend screening at an earlier age if you have risk factors for colon cancer.  Your health care provider may recommend using home test kits to check for hidden blood in the stool.  A small camera at the end of a tube can be used to examine your colon (sigmoidoscopy or colonoscopy). This checks for the earliest forms of colorectal cancer.  Prostate and Testicular Cancer  Depending on your age and overall health, your health care provider may do certain tests to screen for prostate and testicular cancer.  Talk to your health care provider about any symptoms or concerns you have about testicular or prostate cancer.  Skin Cancer  Check your skin from head to toe regularly.  Tell your health care provider about any new moles or changes in moles, especially if: ? There is a change in a mole's size, shape, or color. ? You have a mole that is larger than a pencil eraser.  Always use sunscreen. Apply sunscreen liberally and repeat throughout the day.  Protect yourself by wearing long sleeves, pants, a wide-brimmed hat, and sunglasses when outside.  What should I know about heart disease, diabetes, and high blood pressure?  If you are 18-39 years of age,   have your blood pressure checked every 3-5 years. If you are 40 years of age or older, have your blood pressure checked every year. You should have your blood pressure measured twice-once when you are at a hospital or clinic, and once when you are not at a hospital or clinic. Record the average of the two measurements. To check your blood pressure when you are not at a hospital or clinic, you can use: ? An automated blood pressure machine at a pharmacy. ? A home blood  pressure monitor.  Talk to your health care provider about your target blood pressure.  If you are between 45-79 years old, ask your health care provider if you should take aspirin to prevent heart disease.  Have regular diabetes screenings by checking your fasting blood sugar level. ? If you are at a normal weight and have a low risk for diabetes, have this test once every three years after the age of 45. ? If you are overweight and have a high risk for diabetes, consider being tested at a younger age or more often.  A one-time screening for abdominal aortic aneurysm (AAA) by ultrasound is recommended for men aged 65-75 years who are current or former smokers. What should I know about preventing infection? Hepatitis B If you have a higher risk for hepatitis B, you should be screened for this virus. Talk with your health care provider to find out if you are at risk for hepatitis B infection. Hepatitis C Blood testing is recommended for:  Everyone born from 1945 through 1965.  Anyone with known risk factors for hepatitis C.  Sexually Transmitted Diseases (STDs)  You should be screened each year for STDs including gonorrhea and chlamydia if: ? You are sexually active and are younger than 73 years of age. ? You are older than 73 years of age and your health care provider tells you that you are at risk for this type of infection. ? Your sexual activity has changed since you were last screened and you are at an increased risk for chlamydia or gonorrhea. Ask your health care provider if you are at risk.  Talk with your health care provider about whether you are at high risk of being infected with HIV. Your health care provider may recommend a prescription medicine to help prevent HIV infection.  What else can I do?  Schedule regular health, dental, and eye exams.  Stay current with your vaccines (immunizations).  Do not use any tobacco products, such as cigarettes, chewing tobacco, and  e-cigarettes. If you need help quitting, ask your health care provider.  Limit alcohol intake to no more than 2 drinks per day. One drink equals 12 ounces of beer, 5 ounces of wine, or 1 ounces of hard liquor.  Do not use street drugs.  Do not share needles.  Ask your health care provider for help if you need support or information about quitting drugs.  Tell your health care provider if you often feel depressed.  Tell your health care provider if you have ever been abused or do not feel safe at home. This information is not intended to replace advice given to you by your health care provider. Make sure you discuss any questions you have with your health care provider. Document Released: 07/28/2007 Document Revised: 09/28/2015 Document Reviewed: 11/02/2014 Elsevier Interactive Patient Education  2018 Elsevier Inc.  

## 2018-01-23 NOTE — Progress Notes (Signed)
   Subjective:    Patient ID: Gary Butler, male    DOB: 28-Apr-1945, 72 y.o.   MRN: [id number]  HPI CPE- UTD on colonoscopy, eye exam.  Dr Amil Amen did foot exam.  UTD on immunizations.  Pt has gained 25 lbs since last visit.  Pt has done well on [employer] and plans to rejoin.   Review of Systems Patient reports no vision/hearing changes, anorexia, fever ,adenopathy, persistant/recurrent hoarseness, swallowing issues, chest pain, palpitations, edema, persistant/recurrent cough, hemoptysis, dyspnea (rest,exertional, paroxysmal nocturnal), gastrointestinal  bleeding (melena, rectal bleeding), abdominal pain, excessive heart burn, GU symptoms (dysuria, hematuria, voiding/incontinence issues) syncope, focal weakness, memory loss, numbness & tingling, skin/hair/nail changes, depression, anxiety, abnormal bruising/bleeding, musculoskeletal symptoms/signs.     Objective:   Physical Exam General Appearance:    Alert, cooperative, no distress, appears stated age, obese  Head:    Normocephalic, without obvious abnormality, atraumatic  Eyes:    PERRL, conjunctiva/corneas clear, EOM's intact, fundi    benign, both eyes       Ears:    Normal TM's and external ear canals, both ears  Nose:   Nares normal, septum midline, mucosa normal, no drainage   or sinus tenderness  Throat:   Lips, mucosa, and tongue normal; teeth and gums normal  Neck:   Supple, symmetrical, trachea midline, no adenopathy;       thyroid:  No enlargement/tenderness/nodules  Back:     Symmetric, no curvature, ROM normal, no CVA tenderness  Lungs:     Clear to auscultation bilaterally, respirations unlabored  Chest wall:    No tenderness or deformity  Heart:    Regular rate and rhythm, S1 and S2 normal, no murmur, rub   or gallop  Abdomen:     Soft, non-tender, bowel sounds active all four quadrants,    no masses, no organomegaly  Genitalia:    Deferred to urology  Rectal:    Extremities:   Extremities normal, atraumatic, no cyanosis or edema   Pulses:   2+ and symmetric all extremities  Skin:   Skin color, texture, turgor normal, no rashes or lesions  Lymph nodes:   Cervical, supraclavicular, and axillary nodes normal  Neurologic:   CNII-XII intact. Normal strength, sensation and reflexes      throughout          Assessment & Plan:

## 2018-01-23 NOTE — Patient Instructions (Signed)
Follow up in 3-4 months to recheck diabetes We'll notify you of your lab results and make any changes if needed Continue to work on healthy diet and regular exercise- you can do it! Weight Watchers is a good option! Call with any questions or concerns Merry Christmas!!!

## 2018-01-23 NOTE — Assessment & Plan Note (Signed)
Pt's PE WNL w/ exception of morbid obesity.  He has gained 25 lbs since last visit.  Stressed need for healthy diet and regular exercise.  Plans to rejoin [facility].  UTD on colonoscopy, immunizations.  Check labs.  Anticipatory guidance provided.

## 2018-01-23 NOTE — Assessment & Plan Note (Signed)
Following w/ Dr Amil Amen

## 2018-01-24 ENCOUNTER — Ambulatory Visit: Payer: Medicare Other | Admitting: Neurology

## 2018-01-24 ENCOUNTER — Encounter: Payer: Self-pay | Admitting: General Practice

## 2018-02-12 ENCOUNTER — Other Ambulatory Visit: Payer: Self-pay | Admitting: Internal Medicine

## 2018-02-12 ENCOUNTER — Other Ambulatory Visit: Payer: Self-pay | Admitting: Family Medicine

## 2018-02-13 ENCOUNTER — Other Ambulatory Visit: Payer: Self-pay | Admitting: Family Medicine

## 2018-03-13 ENCOUNTER — Other Ambulatory Visit: Payer: Self-pay | Admitting: Family Medicine

## 2018-03-19 ENCOUNTER — Other Ambulatory Visit: Payer: Self-pay | Admitting: Family Medicine

## 2018-03-19 NOTE — Telephone Encounter (Signed)
Please advise? This is listed as historical provider

## 2018-03-20 NOTE — Telephone Encounter (Signed)
Will call and inform pt

## 2018-03-20 NOTE — Telephone Encounter (Signed)
Called and informed pt wife. She said she would call and ask urology for a refill.

## 2018-03-20 NOTE — Telephone Encounter (Signed)
This needs to come from Urology

## 2018-04-07 ENCOUNTER — Ambulatory Visit: Payer: Medicare Other | Admitting: Physician Assistant

## 2018-04-07 ENCOUNTER — Other Ambulatory Visit: Payer: Self-pay

## 2018-04-07 ENCOUNTER — Encounter: Payer: Self-pay | Admitting: Physician Assistant

## 2018-04-07 VITALS — BP 118/82 | HR 65 | Temp 98.2°F | Resp 16 | Ht 72.0 in | Wt 350.0 lb

## 2018-04-07 DIAGNOSIS — Z20828 Contact with and (suspected) exposure to other viral communicable diseases: Secondary | ICD-10-CM

## 2018-04-07 DIAGNOSIS — J069 Acute upper respiratory infection, unspecified: Secondary | ICD-10-CM

## 2018-04-07 MED ORDER — BENZONATATE 100 MG PO CAPS: 100.0000 mg | ORAL_CAPSULE | Freq: Three times a day (TID) | ORAL | 0 refills | Status: DC | PRN

## 2018-04-07 MED ORDER — OSELTAMIVIR PHOSPHATE 75 MG PO CAPS: 75.0000 mg | ORAL_CAPSULE | Freq: Two times a day (BID) | ORAL | 0 refills | Status: DC

## 2018-04-07 MED ORDER — ALBUTEROL SULFATE (2.5 MG/3ML) 0.083% IN NEBU
2.5000 mg | INHALATION_SOLUTION | Freq: Once | RESPIRATORY_TRACT | Status: AC
  Administered 2018-04-07: 2.5 mg via RESPIRATORY_TRACT

## 2018-04-07 MED ORDER — ALBUTEROL SULFATE 108 (90 BASE) MCG/ACT IN AEPB: 2.0000 | INHALATION_SPRAY | Freq: Four times a day (QID) | RESPIRATORY_TRACT | 3 refills | Status: DC | PRN

## 2018-04-07 MED ORDER — ALBUTEROL SULFATE HFA 108 (90 BASE) MCG/ACT IN AERS: 2.0000 | INHALATION_SPRAY | Freq: Four times a day (QID) | RESPIRATORY_TRACT | 0 refills | Status: DC | PRN

## 2018-04-07 NOTE — Patient Instructions (Signed)
Please keep well-hydrated and get plenty of rest. Take the Tessalon as directed for cough. Plain Mucinex to thin out congestion. Use albuterol as directed if needed for wheezing. Start the Tamiflu, taking as directed. Tylenol if needed for fever or aches.   Influenza, Adult Influenza is also called "the flu." It is an infection in the lungs, nose, and throat (respiratory tract). It is caused by a virus. The flu causes symptoms that are similar to symptoms of a cold. It also causes a high fever and body aches. The flu spreads easily from person to person (is contagious). Getting a flu shot (influenza vaccination) every year is the best way to prevent the flu. What are the causes? This condition is caused by the influenza virus. You can get the virus by:  Breathing in droplets that are in the air from the cough or sneeze of a person who has the virus.  Touching something that has the virus on it (is contaminated) and then touching your mouth, nose, or eyes. What increases the risk? Certain things may make you more likely to get the flu. These include:  Not washing your hands often.  Having close contact with many people during cold and flu season.  Touching your mouth, eyes, or nose without first washing your hands.  Not getting a flu shot every year. You may have a higher risk for the flu, along with serious problems such as a lung infection (pneumonia), if you:  Are older than 65.  Are pregnant.  Have a weakened disease-fighting system (immune system) because of a disease or taking certain medicines.  Have a long-term (chronic) illness, such as: ? Heart, kidney, or lung disease. ? Diabetes. ? Asthma.  Have a liver disorder.  Are very overweight (morbidly obese).  Have anemia. This is a condition that affects your red blood cells. What are the signs or symptoms? Symptoms usually begin suddenly and last 4-14 days. They may include:  Fever and chills.  Headaches, body  aches, or muscle aches.  Sore throat.  Cough.  Runny or stuffy (congested) nose.  Chest discomfort.  Not wanting to eat as much as normal (poor appetite).  Weakness or feeling tired (fatigue).  Dizziness.  Feeling sick to your stomach (nauseous) or throwing up (vomiting). How is this treated? If the flu is found early, you can be treated with medicine that can help reduce how bad the illness is and how long it lasts (antiviral medicine). This may be given by mouth (orally) or through an IV tube. Taking care of yourself at home can help your symptoms get better. Your doctor may suggest:  Taking over-the-counter medicines.  Drinking plenty of fluids. The flu often goes away on its own. If you have very bad symptoms or other problems, you may be treated in a hospital. Follow these instructions at home:     Activity  Rest as needed. Get plenty of sleep.  Stay home from work or school as told by your doctor. ? Do not leave home until you do not have a fever for 24 hours without taking medicine. ? Leave home only to visit your doctor. Eating and drinking  Take an ORS (oral rehydration solution). This is a drink that is sold at pharmacies and stores.  Drink enough fluid to keep your pee (urine) pale yellow.  Drink clear fluids in small amounts as you are able. Clear fluids include: ? Water. ? Ice chips. ? Fruit juice that has water added (diluted fruit juice). ?  Low-calorie sports drinks.  Eat bland, easy-to-digest foods in small amounts as you are able. These foods include: ? Bananas. ? Applesauce. ? Rice. ? Lean meats. ? Toast. ? Crackers.  Do not eat or drink: ? Fluids that have a lot of sugar or caffeine. ? Alcohol. ? Spicy or fatty foods. General instructions  Take over-the-counter and prescription medicines only as told by your doctor.  Use a cool mist humidifier to add moisture to the air in your home. This can make it easier for you to  breathe.  Cover your mouth and nose when you cough or sneeze.  Wash your hands with soap and water often, especially after you cough or sneeze. If you cannot use soap and water, use alcohol-based hand sanitizer.  Keep all follow-up visits as told by your doctor. This is important. How is this prevented?   Get a flu shot every year. You may get the flu shot in late summer, fall, or winter. Ask your doctor when you should get your flu shot.  Avoid contact with people who are sick during fall and winter (cold and flu season). Contact a doctor if:  You get new symptoms.  You have: ? Chest pain. ? Watery poop (diarrhea). ? A fever.  Your cough gets worse.  You start to have more mucus.  You feel sick to your stomach.  You throw up. Get help right away if you:  Have shortness of breath.  Have trouble breathing.  Have skin or nails that turn a bluish color.  Have very bad pain or stiffness in your neck.  Get a sudden headache.  Get sudden pain in your face or ear.  Cannot eat or drink without throwing up. Summary  Influenza ("the flu") is an infection in the lungs, nose, and throat. It is caused by a virus.  Take over-the-counter and prescription medicines only as told by your doctor.  Getting a flu shot every year is the best way to avoid getting the flu. This information is not intended to replace advice given to you by your health care provider. Make sure you discuss any questions you have with your health care provider. Document Released: 11/08/2007 Document Revised: 07/17/2017 Document Reviewed: 07/17/2017 Elsevier Interactive Patient Education  2019 [employer].

## 2018-04-07 NOTE — Addendum Note (Signed)
Addended by: Leonidas Romberg on: 04/07/2018 02:53 PM   Modules accepted: Orders

## 2018-04-07 NOTE — Addendum Note (Signed)
Addended by: Leonidas Romberg on: 04/07/2018 04:30 PM   Modules accepted: Orders

## 2018-04-07 NOTE — Progress Notes (Signed)
Patient presents to clinic today c/o 2 days of rhinorrhea, chest congestion and cough that is sometimes productive of yellow phlegm. Denies fever, chills, malaise. Notes some wheezing and chest tightness. Just returned from cruise 2 days ago -- went to the [place] for 5 days. Wife with similar symptoms. Has taken tylenol but not other medications for symptoms.   Past Medical History:  Diagnosis Date  . Arthritis   . Constipation   . Diabetes mellitus   . Heart murmur   . History of gout   . History of skin cancer   . Hypertension   . OSA (obstructive sleep apnea)   . Psoriatic arthritis ([place])   . Rectal fissure   . Sleep apnea   . Tinnitus     Current Outpatient Medications on File Prior to Visit  Medication Sig Dispense Refill  . allopurinol (ZYLOPRIM) 300 MG tablet TAKE 1 TABLET(300 MG) BY MOUTH DAILY 30 tablet 0  . aspirin 81 MG tablet Take 81 mg by mouth daily.    Marland Kitchen atorvastatin (LIPITOR) 40 MG tablet TAKE 1 TABLET(40 MG) BY MOUTH EVERY EVENING 90 tablet 0  . bisoprolol (ZEBETA) 5 MG tablet Take 1 tablet (5 mg total) by mouth daily. 30 tablet 11  . cetirizine (ZYRTEC) 10 MG tablet TAKE 1 TABLET(10 MG) BY MOUTH DAILY 30 tablet 0  . Cholecalciferol (VITAMIN D) 2000 UNITS tablet Take 2,000 Units by mouth daily.    . Cyanocobalamin (B-12) 2000 MCG TABS Take by mouth.    . donepezil (ARICEPT) 5 MG tablet Take 1/2 tablet daily for 1 month, then increase to 1 tablet daily 30 tablet 11  . etodolac (LODINE) 400 MG tablet Take by mouth.    . fluticasone (FLONASE) 50 MCG/ACT nasal spray Place 2 sprays into both nostrils daily. 16 g 6  . folic acid (FOLVITE) 1 MG tablet Take 1 mg by mouth daily.     . furosemide (LASIX) 40 MG tablet Take 1 tablet (40 mg total) by mouth daily. (Patient taking differently: Take 40 mg by mouth every other day. ) 30 tablet 0  . meloxicam (MOBIC) 15 MG tablet TAKE 1 TABLET(15 MG) BY MOUTH DAILY 90 tablet 0  . methotrexate (RHEUMATREX) 2.5 MG tablet Take 15  mg by mouth once a week.     Marland Kitchen MYRBETRIQ 25 MG TB24 tablet     . nystatin ointment (MYCOSTATIN) Apply 1 application topically 2 (two) times daily. (Patient taking differently: Apply 1 application topically 2 (two) times daily as needed. ) 90 g 1  . omeprazole (PRILOSEC) 40 MG capsule Take 30- 60 min before your first and last meals of the day 180 capsule 1  . pioglitazone (ACTOS) 30 MG tablet TAKE 1 TABLET BY MOUTH DAILY 90 tablet 0  . potassium chloride (MICRO-K) 10 MEQ CR capsule TAKE 1 CAPSULE BY MOUTH EVERY DAY 90 capsule 0  . sertraline (ZOLOFT) 50 MG tablet TAKE 1 TABLET(50 MG) BY MOUTH DAILY 90 tablet 0  . telmisartan (MICARDIS) 40 MG tablet TAKE 1 TABLET(40 MG) BY MOUTH DAILY 30 tablet 11  . tiZANidine (ZANAFLEX) 4 MG tablet Take 1 tablet (4 mg total) by mouth every 6 (six) hours as needed for muscle spasms. 30 tablet 0  . triamterene-hydrochlorothiazide (MAXZIDE) 75-50 MG tablet Take 0.5 tablets by mouth every morning. 45 tablet 1  . UNABLE TO FIND Med Name: BiPap with [facility]     No current facility-administered medications on file prior to visit.     Allergies  Allergen Reactions  . Lyrica [Pregabalin] Swelling  . Penicillins Swelling  . Levofloxacin Rash    Family History  Problem Relation Age of Onset  . Emphysema Father   . Clotting disorder Father   . Arthritis Father   . Heart disease Mother   . Arthritis Mother   . Arthritis Maternal Grandmother   . Hypertension Maternal Grandmother   . Diabetes Maternal Grandmother   . Arthritis Paternal Grandmother   . Diabetes Maternal Aunt   . Diabetes Paternal Aunt     Social History   Socioeconomic History  . Marital status: Married    Spouse name: Not on file  . Number of children: 2  . Years of education: Not on file  . Highest education level: Not on file  Occupational History  . 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 Financial resource strain:  Not on file  . Food insecurity:    Worry: Not on file    Inability: Not on file  . Transportation needs:    Medical: Not on file    Non-medical: Not on file  Tobacco Use  . Smoking status: Never Smoker  . Smokeless tobacco: Never Used  Substance and Sexual Activity  . Alcohol use: Yes    Alcohol/week: 0.0 standard drinks    Comment: Once a month  . Drug use: No  . Sexual activity: Never  Lifestyle  . Physical activity:    Days per week: Not on file    Minutes per session: Not on file  . Stress: Not on file  Relationships  . Social connections:    Talks on phone: Not on file    Gets together: Not on file    Attends religious service: Not on file    Active member of club or organization: Not on file    Attends meetings of clubs or organizations: Not on file    Relationship status: Not on file  Other Topics Concern  . Not on file  Social History Narrative   Alcohol- 1 to 2 per month.    Review of Systems - See HPI.  All other ROS are negative.  BP 118/82   Pulse 65   Temp 98.2 F (36.8 C) (Oral)   Resp 16   Wt (!) 350 lb (158.8 kg)   SpO2 98%   BMI 47.47 kg/m   Physical Exam Vitals signs reviewed.  Constitutional:      Appearance: Normal appearance.  HENT:     Head: Normocephalic and atraumatic.     Right Ear: Tympanic membrane normal.     Left Ear: Tympanic membrane normal.     Nose: Congestion present.     Mouth/Throat:     Mouth: Mucous membranes are moist.     Pharynx: Oropharynx is clear. No oropharyngeal exudate or posterior oropharyngeal erythema.  Eyes:     Conjunctiva/sclera: Conjunctivae normal.  Neck:     Musculoskeletal: Neck supple.  Cardiovascular:     Rate and Rhythm: Normal rate and regular rhythm.     Pulses: Normal pulses.     Heart sounds: Normal heart sounds.  Pulmonary:     Effort: Pulmonary effort is normal.     Breath sounds: Wheezing present.  Neurological:     General: No focal deficit present.     Mental Status: He is alert  and oriented to person, place, and time.  Psychiatric:  Mood and Affect: Mood normal.     Recent Results (from the past 2160 hour(s))  Hemoglobin A1c     Status: Abnormal   Collection Time: 01/23/18 10:25 AM  Result Value Ref Range   Hgb A1c MFr Bld 7.7 (H) 4.6 - 6.5 %    Comment: Glycemic Control Guidelines for People with Diabetes:Non Diabetic:  <6%Goal of Therapy: <7%Additional Action Suggested:  >8%   Lipid panel     Status: Abnormal   Collection Time: 01/23/18 10:25 AM  Result Value Ref Range   Cholesterol 97 0 - 200 mg/dL    Comment: ATP III Classification       Desirable:  < 200 mg/dL               Borderline High:  200 - 239 mg/dL          High:  > = 240 mg/dL   Triglycerides 98.0 0.0 - 149.0 mg/dL    Comment: Normal:  <150 mg/dLBorderline High:  150 - 199 mg/dL   HDL 32.10 (L) >39.00 mg/dL   VLDL 19.6 0.0 - 40.0 mg/dL   LDL Cholesterol 46 0 - 99 mg/dL   Total CHOL/HDL Ratio 3     Comment:                Men          Women1/2 Average Risk     3.4          3.3Average Risk          5.0          4.42X Average Risk          9.6          7.13X Average Risk          15.0          11.0                       NonHDL 65.32     Comment: NOTE:  Non-HDL goal should be 30 mg/dL higher than patient's LDL goal (i.e. LDL goal of < 70 mg/dL, would have non-HDL goal of < 100 mg/dL)  Basic metabolic panel     Status: Abnormal   Collection Time: 01/23/18 10:25 AM  Result Value Ref Range   Sodium 139 135 - 145 mEq/L   Potassium 3.9 3.5 - 5.1 mEq/L   Chloride 102 96 - 112 mEq/L   CO2 31 19 - 32 mEq/L   Glucose, Bld 165 (H) 70 - 99 mg/dL   BUN 18 6 - 23 mg/dL   Creatinine, Ser 1.02 0.40 - 1.50 mg/dL   Calcium 9.1 8.4 - 10.5 mg/dL   GFR 76.05 >60.00 mL/min  TSH     Status: None   Collection Time: 01/23/18 10:25 AM  Result Value Ref Range   TSH 3.10 0.35 - 4.50 uIU/mL  Hepatic function panel     Status: Abnormal   Collection Time: 01/23/18 10:25 AM  Result Value Ref Range   Total  Bilirubin 1.1 0.2 - 1.2 mg/dL   Bilirubin, Direct 0.3 0.0 - 0.3 mg/dL   Alkaline Phosphatase 100 39 - 117 U/L   AST 17 0 - 37 U/L   ALT 15 0 - 53 U/L   Total Protein 5.5 (L) 6.0 - 8.3 g/dL   Albumin 3.6 3.5 - 5.2 g/dL  CBC with Differential/Platelet     Status: Abnormal   Collection Time: 01/23/18 10:25 AM  Result Value Ref Range   WBC 5.3 4.0 - 10.5 K/uL   RBC 4.59 4.22 - 5.81 Mil/uL   Hemoglobin 14.5 13.0 - 17.0 g/dL   HCT 42.8 39.0 - 52.0 %   MCV 93.3 78.0 - 100.0 fl   MCHC 33.8 30.0 - 36.0 g/dL   RDW 17.1 (H) 11.5 - 15.5 %   Platelets 192.0 150.0 - 400.0 K/uL   Neutrophils Relative % 69.5 43.0 - 77.0 %   Lymphocytes Relative 16.9 12.0 - 46.0 %   Monocytes Relative 7.1 3.0 - 12.0 %   Eosinophils Relative 5.0 0.0 - 5.0 %   Basophils Relative 1.5 0.0 - 3.0 %   Neutro Abs 3.7 1.4 - 7.7 K/uL   Lymphs Abs 0.9 0.7 - 4.0 K/uL   Monocytes Absolute 0.4 0.1 - 1.0 K/uL   Eosinophils Absolute 0.3 0.0 - 0.7 K/uL   Basophils Absolute 0.1 0.0 - 0.1 K/uL   Assessment/Plan: 1. Exposure to the flu 2. Upper respiratory tract infection, unspecified type Wife here today with some similar symptoms -- tested + Flu A. Will start Tamiflu. Also start Tessalon and Albuterol. Alb neb given in office with improvement in wheeze. Lungs CTAB on repeat exam. Supportive measures and OTC medications reviewed.  - benzonatate (TESSALON) 100 MG capsule; Take 1 capsule (100 mg total) by mouth 3 (three) times daily as needed for cough.  Dispense: 30 capsule; Refill: 0 - albuterol (PROVENTIL HFA;VENTOLIN HFA) 108 (90 Base) MCG/ACT inhaler; Inhale 2 puffs into the lungs every 6 (six) hours as needed for wheezing or shortness of breath.  Dispense: 1 Inhaler; Refill: 0   Leeanne Rio, PA-C

## 2018-04-11 ENCOUNTER — Other Ambulatory Visit: Payer: Self-pay | Admitting: Family Medicine

## 2018-04-12 ENCOUNTER — Other Ambulatory Visit: Payer: Self-pay | Admitting: Physician Assistant

## 2018-04-12 ENCOUNTER — Other Ambulatory Visit: Payer: Self-pay | Admitting: Family Medicine

## 2018-04-12 ENCOUNTER — Other Ambulatory Visit: Payer: Self-pay | Admitting: Internal Medicine

## 2018-04-14 ENCOUNTER — Ambulatory Visit: Payer: Self-pay

## 2018-04-14 ENCOUNTER — Ambulatory Visit: Payer: Medicare Other | Admitting: Cardiovascular Disease

## 2018-04-14 NOTE — Telephone Encounter (Signed)
Pt. Reports he was seen last week with the flu. Finished the Tamiflu. He is still having body aches and productive cough with yellow mucus. Using his CPAP at night, "so that is helping with my breathing." Denies any fever. Wants to know if he needs more Tamiflu or an antibiotic. Please advise pt.  Answer Assessment - Initial Assessment Questions 1. DIAGNOSIS CONFIRMATION: "When was the influenza diagnosed?" "By whom?" "Did you get a test for it?"     04/07/18 2. MEDICINES: "Were you prescribed any medications for the influenza?"  (e.g., zanamivir [Relenza], oseltamavir [Tamiflu]).      Tamiflu 3. ONSET of SYMPTOMS: "When did your symptoms start?"     Last week 4. SYMPTOMS: "What symptoms are you most concerned about?" (e.g., runny nose, stuffy nose, sore throat, cough, breathing difficulty, fever)     Productive cough with yellow mucus 5. COUGH: "How bad is the cough?"     During the day is coughing 6. FEVER: "Do you have a fever?" If so, ask: "What is your temperature, how was it measured, and when did it start?"     No 7. RESPIRATORY DISTRESS: "Are you having any trouble breathing?" If yes, ask: "Describe your breathing."      No 8. FLU VACCINE: "Did you receive a flu shot this year?" (e.g., seasonal influenza, H1N1)     No 9. PREGNANCY: "Is there any chance you are pregnant?" "When was your last menstrual period?"     n/a 10. HIGH RISK for COMPLICATIONS: "Do you have any heart or lung problems? Do you have a weakened immune system?" (e.g., CHF, COPD, asthma, HIV positive, chemotherapy, renal failure, diabetes mellitus, sickle cell anemia)       Sleep apnea  Protocols used: INFLUENZA FOLLOW-UP CALL-A-AH

## 2018-04-14 NOTE — Telephone Encounter (Signed)
Please advise? Would you like pt to return for a repeat exam?

## 2018-04-14 NOTE — Telephone Encounter (Signed)
No further Tamiflu should be needed.  Is he taking plain Mucinex to thin out congestion? If not, have him start this and see if it helps. If he is already doing this have him come see me tomorrow so we can reassess.

## 2018-04-14 NOTE — Telephone Encounter (Signed)
Pt is scheduled for OV 04/15/18 @ 1030 am

## 2018-04-15 ENCOUNTER — Other Ambulatory Visit: Payer: Self-pay

## 2018-04-15 ENCOUNTER — Encounter: Payer: Self-pay | Admitting: Physician Assistant

## 2018-04-15 ENCOUNTER — Ambulatory Visit: Payer: Medicare Other | Admitting: Physician Assistant

## 2018-04-15 VITALS — BP 110/70 | HR 72 | Temp 98.5°F | Resp 16 | Ht 72.0 in | Wt 351.0 lb

## 2018-04-15 DIAGNOSIS — J209 Acute bronchitis, unspecified: Secondary | ICD-10-CM

## 2018-04-15 MED ORDER — HYDROCOD POLST-CPM POLST ER 10-8 MG/5ML PO SUER: 5.0000 mL | Freq: Two times a day (BID) | ORAL | 0 refills | Status: DC | PRN

## 2018-04-15 MED ORDER — DOXYCYCLINE HYCLATE 100 MG PO CAPS: 100.0000 mg | ORAL_CAPSULE | Freq: Two times a day (BID) | ORAL | 0 refills | Status: DC

## 2018-04-15 NOTE — Progress Notes (Signed)
Patient presents to clinic today c/o continued cough that is now more productive of thick sputum. Denies fever or chills. Noting some residual wheeze. Has not been using inhaler. Notes the Lavella Lemons has helped some. Has completed course of Tamiflu. Denies any other new or worsening symptoms.   Past Medical History:  Diagnosis Date  . Arthritis   . Constipation   . Diabetes mellitus   . Heart murmur   . History of gout   . History of skin cancer   . Hypertension   . OSA (obstructive sleep apnea)   . Psoriatic arthritis ([place])   . Rectal fissure   . Sleep apnea   . Tinnitus     Current Outpatient Medications on File Prior to Visit  Medication Sig Dispense Refill  . Albuterol Sulfate (PROAIR RESPICLICK) 836 (90 Base) MCG/ACT AEPB Inhale 2 puffs into the lungs every 6 (six) hours as needed (for wheezing or shortness of breath). 1 each 3  . allopurinol (ZYLOPRIM) 300 MG tablet TAKE 1 TABLET(300 MG) BY MOUTH DAILY 30 tablet 0  . aspirin 81 MG tablet Take 81 mg by mouth daily.    Marland Kitchen atorvastatin (LIPITOR) 40 MG tablet TAKE 1 TABLET(40 MG) BY MOUTH EVERY EVENING 90 tablet 0  . benzonatate (TESSALON) 100 MG capsule Take 1 capsule (100 mg total) by mouth 3 (three) times daily as needed for cough. 30 capsule 0  . bisoprolol (ZEBETA) 5 MG tablet Take 1 tablet (5 mg total) by mouth daily. 30 tablet 11  . cetirizine (ZYRTEC) 10 MG tablet TAKE 1 TABLET(10 MG) BY MOUTH DAILY 30 tablet 0  . Cholecalciferol (VITAMIN D) 2000 UNITS tablet Take 2,000 Units by mouth daily.    . Cyanocobalamin (B-12) 2000 MCG TABS Take by mouth.    . donepezil (ARICEPT) 5 MG tablet Take 1/2 tablet daily for 1 month, then increase to 1 tablet daily 30 tablet 11  . etodolac (LODINE) 400 MG tablet Take by mouth.    . fluticasone (FLONASE) 50 MCG/ACT nasal spray Place 2 sprays into both nostrils daily. 16 g 6  . folic acid (FOLVITE) 1 MG tablet Take 1 mg by mouth daily.     . furosemide (LASIX) 40 MG tablet Take 1 tablet (40  mg total) by mouth daily. (Patient taking differently: Take 40 mg by mouth every other day. ) 30 tablet 0  . meloxicam (MOBIC) 15 MG tablet TAKE 1 TABLET(15 MG) BY MOUTH DAILY 90 tablet 0  . methotrexate (RHEUMATREX) 2.5 MG tablet Take 15 mg by mouth once a week.     Marland Kitchen MYRBETRIQ 25 MG TB24 tablet     . nystatin ointment (MYCOSTATIN) Apply 1 application topically 2 (two) times daily. (Patient taking differently: Apply 1 application topically 2 (two) times daily as needed. ) 90 g 1  . omeprazole (PRILOSEC) 40 MG capsule Take 30- 60 min before your first and last meals of the day 180 capsule 1  . pioglitazone (ACTOS) 30 MG tablet TAKE 1 TABLET BY MOUTH DAILY 90 tablet 0  . potassium chloride (MICRO-K) 10 MEQ CR capsule TAKE 1 CAPSULE BY MOUTH EVERY DAY 90 capsule 0  . sertraline (ZOLOFT) 50 MG tablet TAKE 1 TABLET(50 MG) BY MOUTH DAILY 90 tablet 0  . telmisartan (MICARDIS) 40 MG tablet TAKE 1 TABLET(40 MG) BY MOUTH DAILY 30 tablet 11  . tiZANidine (ZANAFLEX) 4 MG tablet Take 1 tablet (4 mg total) by mouth every 6 (six) hours as needed for muscle spasms. 30 tablet  0  . triamterene-hydrochlorothiazide (MAXZIDE) 75-50 MG tablet TAKE 1/2 TABLET BY MOUTH EVERY MORNING 45 tablet 1  . UNABLE TO FIND Med Name: BiPap with [facility]     No current facility-administered medications on file prior to visit.     Allergies  Allergen Reactions  . Lyrica [Pregabalin] Swelling  . Penicillins Swelling  . Levofloxacin Rash    Family History  Problem Relation Age of Onset  . Emphysema Father   . Clotting disorder Father   . Arthritis Father   . Heart disease Mother   . Arthritis Mother   . Arthritis Maternal Grandmother   . Hypertension Maternal Grandmother   . Diabetes Maternal Grandmother   . Arthritis Paternal Grandmother   . Diabetes Maternal Aunt   . Diabetes Paternal Aunt     Social History   Socioeconomic History  . Marital status: Married    Spouse name: Not on file  . Number of children: 2   . Years of education: Not on file  . Highest education level: Not on file  Occupational History  . 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 Financial resource strain: Not on file  . Food insecurity:    Worry: Not on file    Inability: Not on file  . Transportation needs:    Medical: Not on file    Non-medical: Not on file  Tobacco Use  . Smoking status: Never Smoker  . Smokeless tobacco: Never Used  Substance and Sexual Activity  . Alcohol use: Yes    Alcohol/week: 0.0 standard drinks    Comment: Once a month  . Drug use: No  . Sexual activity: Never  Lifestyle  . Physical activity:    Days per week: Not on file    Minutes per session: Not on file  . Stress: Not on file  Relationships  . Social connections:    Talks on phone: Not on file    Gets together: Not on file    Attends religious service: Not on file    Active member of club or organization: Not on file    Attends meetings of clubs or organizations: Not on file    Relationship status: Not on file  Other Topics Concern  . Not on file  Social History Narrative   Alcohol- 1 to 2 per month.    Review of Systems - See HPI.  All other ROS are negative.  BP 110/70   Pulse 72   Temp 98.5 F (36.9 C) (Oral)   Resp 16   Wt (!) 351 lb (159.2 kg)   SpO2 97%   BMI 47.60 kg/m   Physical Exam Vitals signs reviewed.  Constitutional:      Appearance: Normal appearance.  HENT:     Head: Normocephalic and atraumatic.     Right Ear: Tympanic membrane normal.     Left Ear: Tympanic membrane normal.     Nose: Nose normal.     Mouth/Throat:     Mouth: Mucous membranes are moist.  Eyes:     Conjunctiva/sclera: Conjunctivae normal.  Neck:     Musculoskeletal: Neck supple.  Cardiovascular:     Rate and Rhythm: Normal rate and regular rhythm.     Heart sounds: Normal heart sounds.  Pulmonary:     Breath sounds: Wheezing (very faint -- lung bases  bilaterally) present.  Neurological:     General: No focal deficit present.  Mental Status: He is alert and oriented to person, place, and time.  Psychiatric:        Mood and Affect: Mood normal.    Recent Results (from the past 2160 hour(s))  Hemoglobin A1c     Status: Abnormal   Collection Time: 01/23/18 10:25 AM  Result Value Ref Range   Hgb A1c MFr Bld 7.7 (H) 4.6 - 6.5 %    Comment: Glycemic Control Guidelines for People with Diabetes:Non Diabetic:  <6%Goal of Therapy: <7%Additional Action Suggested:  >8%   Lipid panel     Status: Abnormal   Collection Time: 01/23/18 10:25 AM  Result Value Ref Range   Cholesterol 97 0 - 200 mg/dL    Comment: ATP III Classification       Desirable:  < 200 mg/dL               Borderline High:  200 - 239 mg/dL          High:  > = 240 mg/dL   Triglycerides 98.0 0.0 - 149.0 mg/dL    Comment: Normal:  <150 mg/dLBorderline High:  150 - 199 mg/dL   HDL 32.10 (L) >39.00 mg/dL   VLDL 19.6 0.0 - 40.0 mg/dL   LDL Cholesterol 46 0 - 99 mg/dL   Total CHOL/HDL Ratio 3     Comment:                Men          Women1/2 Average Risk     3.4          3.3Average Risk          5.0          4.42X Average Risk          9.6          7.13X Average Risk          15.0          11.0                       NonHDL 65.32     Comment: NOTE:  Non-HDL goal should be 30 mg/dL higher than patient's LDL goal (i.e. LDL goal of < 70 mg/dL, would have non-HDL goal of < 100 mg/dL)  Basic metabolic panel     Status: Abnormal   Collection Time: 01/23/18 10:25 AM  Result Value Ref Range   Sodium 139 135 - 145 mEq/L   Potassium 3.9 3.5 - 5.1 mEq/L   Chloride 102 96 - 112 mEq/L   CO2 31 19 - 32 mEq/L   Glucose, Bld 165 (H) 70 - 99 mg/dL   BUN 18 6 - 23 mg/dL   Creatinine, Ser 1.02 0.40 - 1.50 mg/dL   Calcium 9.1 8.4 - 10.5 mg/dL   GFR 76.05 >60.00 mL/min  TSH     Status: None   Collection Time: 01/23/18 10:25 AM  Result Value Ref Range   TSH 3.10 0.35 - 4.50 uIU/mL  Hepatic  function panel     Status: Abnormal   Collection Time: 01/23/18 10:25 AM  Result Value Ref Range   Total Bilirubin 1.1 0.2 - 1.2 mg/dL   Bilirubin, Direct 0.3 0.0 - 0.3 mg/dL   Alkaline Phosphatase 100 39 - 117 U/L   AST 17 0 - 37 U/L   ALT 15 0 - 53 U/L   Total Protein 5.5 (L) 6.0 - 8.3 g/dL   Albumin 3.6 3.5 -  5.2 g/dL  CBC with Differential/Platelet     Status: Abnormal   Collection Time: 01/23/18 10:25 AM  Result Value Ref Range   WBC 5.3 4.0 - 10.5 K/uL   RBC 4.59 4.22 - 5.81 Mil/uL   Hemoglobin 14.5 13.0 - 17.0 g/dL   HCT 42.8 39.0 - 52.0 %   MCV 93.3 78.0 - 100.0 fl   MCHC 33.8 30.0 - 36.0 g/dL   RDW 17.1 (H) 11.5 - 15.5 %   Platelets 192.0 150.0 - 400.0 K/uL   Neutrophils Relative % 69.5 43.0 - 77.0 %   Lymphocytes Relative 16.9 12.0 - 46.0 %   Monocytes Relative 7.1 3.0 - 12.0 %   Eosinophils Relative 5.0 0.0 - 5.0 %   Basophils Relative 1.5 0.0 - 3.0 %   Neutro Abs 3.7 1.4 - 7.7 K/uL   Lymphs Abs 0.9 0.7 - 4.0 K/uL   Monocytes Absolute 0.4 0.1 - 1.0 K/uL   Eosinophils Absolute 0.3 0.0 - 0.7 K/uL   Basophils Absolute 0.1 0.0 - 0.1 K/uL    Assessment/Plan: 1. Acute bronchitis, unspecified organism Rx Doxycycline.  Increase fluids.  Rest.  Saline nasal spray.  Probiotic.  Mucinex as directed.  Humidifier in bedroom. Tussionex per orders. Use inhaler as directed.  Call or return to clinic if symptoms are not improving.  - chlorpheniramine-HYDROcodone (TUSSIONEX PENNKINETIC ER) 10-8 MG/5ML SUER; Take 5 mLs by mouth every 12 (twelve) hours as needed.  Dispense: 70 mL; Refill: 0 - doxycycline (VIBRAMYCIN) 100 MG capsule; Take 1 capsule (100 mg total) by mouth 2 (two) times daily.  Dispense: 14 capsule; Refill: 0   Leeanne Rio, PA-C

## 2018-04-15 NOTE — Patient Instructions (Signed)
Take antibiotic (Doxycycline) as directed.  Increase fluids.  Get plenty of rest. Use Mucinex for congestion. Tussionex will help with cough. Use the inhaler as directed. Take a daily probiotic (I recommend Align or Culturelle, but even Activia Yogurt may be beneficial).  A humidifier placed in the bedroom may offer some relief for a dry, scratchy throat of nasal irritation.  Read information below on acute bronchitis. Please call or return to clinic if symptoms are not improving.  Acute Bronchitis Bronchitis is when the airways that extend from the windpipe into the lungs get red, puffy, and painful (inflamed). Bronchitis often causes thick spit (mucus) to develop. This leads to a cough. A cough is the most common symptom of bronchitis. In acute bronchitis, the condition usually begins suddenly and goes away over time (usually in 2 weeks). Smoking, allergies, and asthma can make bronchitis worse. Repeated episodes of bronchitis may cause more lung problems.  HOME CARE  Rest.  Drink enough fluids to keep your pee (urine) clear or pale yellow (unless you need to limit fluids as told by your doctor).  Only take over-the-counter or prescription medicines as told by your doctor.  Avoid smoking and secondhand smoke. These can make bronchitis worse. If you are a smoker, think about using nicotine gum or skin patches. Quitting smoking will help your lungs heal faster.  Reduce the chance of getting bronchitis again by:  Washing your hands often.  Avoiding people with cold symptoms.  Trying not to touch your hands to your mouth, nose, or eyes.  Follow up with your doctor as told.  GET HELP IF: Your symptoms do not improve after 1 week of treatment. Symptoms include:  Cough.  Fever.  Coughing up thick spit.  Body aches.  Chest congestion.  Chills.  Shortness of breath.  Sore throat.  GET HELP RIGHT AWAY IF:   You have an increased fever.  You have chills.  You have severe  shortness of breath.  You have bloody thick spit (sputum).  You throw up (vomit) often.  You lose too much body fluid (dehydration).  You have a severe headache.  You faint.  MAKE SURE YOU:   Understand these instructions.  Will watch your condition.  Will get help right away if you are not doing well or get worse. Document Released: 07/18/2007 Document Revised: 10/01/2012 Document Reviewed: 07/22/2012 [facility] Patient Information 2015 [place], [place]. This information is not intended to replace advice given to you by your health care provider. Make sure you discuss any questions you have with your health care provider.

## 2018-04-29 ENCOUNTER — Ambulatory Visit: Payer: Medicare Other | Admitting: Podiatry

## 2018-05-01 ENCOUNTER — Encounter: Payer: Self-pay | Admitting: Podiatry

## 2018-05-01 ENCOUNTER — Ambulatory Visit: Payer: Medicare Other | Admitting: Podiatry

## 2018-05-01 ENCOUNTER — Other Ambulatory Visit: Payer: Self-pay

## 2018-05-01 DIAGNOSIS — M79675 Pain in left toe(s): Secondary | ICD-10-CM

## 2018-05-01 DIAGNOSIS — I872 Venous insufficiency (chronic) (peripheral): Secondary | ICD-10-CM

## 2018-05-01 DIAGNOSIS — M79674 Pain in right toe(s): Secondary | ICD-10-CM

## 2018-05-01 DIAGNOSIS — B351 Tinea unguium: Secondary | ICD-10-CM

## 2018-05-01 DIAGNOSIS — E119 Type 2 diabetes mellitus without complications: Secondary | ICD-10-CM | POA: Diagnosis not present

## 2018-05-01 NOTE — Patient Instructions (Signed)
Diabetes Mellitus and Foot Care Foot care is an important part of your health, especially when you have diabetes. Diabetes may cause you to have problems because of poor blood flow (circulation) to your feet and legs, which can cause your skin to:  Become thinner and drier.  Break more easily.  Heal more slowly.  Peel and crack. You may also have nerve damage (neuropathy) in your legs and feet, causing decreased feeling in them. This means that you may not notice minor injuries to your feet that could lead to more serious problems. Noticing and addressing any potential problems early is the best way to prevent future foot problems. How to care for your feet Foot hygiene  Wash your feet daily with warm water and mild soap. Do not use hot water. Then, pat your feet and the areas between your toes until they are completely dry. Do not soak your feet as this can dry your skin.  Trim your toenails straight across. Do not dig under them or around the cuticle. File the edges of your nails with an emery board or nail file.  Apply a moisturizing lotion or petroleum jelly to the skin on your feet and to dry, brittle toenails. Use lotion that does not contain alcohol and is unscented. Do not apply lotion between your toes. Shoes and socks  Wear clean socks or stockings every day. Make sure they are not too tight. Do not wear knee-high stockings since they may decrease blood flow to your legs.  Wear shoes that fit properly and have enough cushioning. Always look in your shoes before you put them on to be sure there are no objects inside.  To break in new shoes, wear them for just a few hours a day. This prevents injuries on your feet. Wounds, scrapes, corns, and calluses  Check your feet daily for blisters, cuts, bruises, sores, and redness. If you cannot see the bottom of your feet, use a mirror or ask someone for help.  Do not cut corns or calluses or try to remove them with medicine.  If you  find a minor scrape, cut, or break in the skin on your feet, keep it and the skin around it clean and dry. You may clean these areas with mild soap and water. Do not clean the area with peroxide, alcohol, or iodine.  If you have a wound, scrape, corn, or callus on your foot, look at it several times a day to make sure it is healing and not infected. Check for: ? Redness, swelling, or pain. ? Fluid or blood. ? Warmth. ? Pus or a bad smell. General instructions  Do not cross your legs. This may decrease blood flow to your feet.  Do not use heating pads or hot water bottles on your feet. They may burn your skin. If you have lost feeling in your feet or legs, you may not know this is happening until it is too late.  Protect your feet from hot and cold by wearing shoes, such as at the beach or on hot pavement.  Schedule a complete foot exam at least once a year (annually) or more often if you have foot problems. If you have foot problems, report any cuts, sores, or bruises to your health care provider immediately. Contact a health care provider if:  You have a medical condition that increases your risk of infection and you have any cuts, sores, or bruises on your feet.  You have an injury that is not   healing.  You have redness on your legs or feet.  You feel burning or tingling in your legs or feet.  You have pain or cramps in your legs and feet.  Your legs or feet are numb.  Your feet always feel cold.  You have pain around a toenail. Get help right away if:  You have a wound, scrape, corn, or callus on your foot and: ? You have pain, swelling, or redness that gets worse. ? You have fluid or blood coming from the wound, scrape, corn, or callus. ? Your wound, scrape, corn, or callus feels warm to the touch. ? You have pus or a bad smell coming from the wound, scrape, corn, or callus. ? You have a fever. ? You have a red line going up your leg. Summary  Check your feet every day  for cuts, sores, red spots, swelling, and blisters.  Moisturize feet and legs daily.  Wear shoes that fit properly and have enough cushioning.  If you have foot problems, report any cuts, sores, or bruises to your health care provider immediately.  Schedule a complete foot exam at least once a year (annually) or more often if you have foot problems. This information is not intended to replace advice given to you by your health care provider. Make sure you discuss any questions you have with your health care provider. Document Released: 01/27/2000 Document Revised: 03/13/2017 Document Reviewed: 03/02/2016 Elsevier Interactive Patient Education  2019 [employer]  Onychomycosis/Fungal Toenails  WHAT IS IT? An infection that lies within the keratin of your nail plate that is caused by a fungus.  WHY ME? Fungal infections affect all ages, sexes, races, and creeds.  There may be many factors that predispose you to a fungal infection such as age, coexisting medical conditions such as diabetes, or an autoimmune disease; stress, medications, fatigue, genetics, etc.  Bottom line: fungus thrives in a warm, moist environment and your shoes offer such a location.  IS IT CONTAGIOUS? Theoretically, yes.  You do not want to share shoes, nail clippers or files with someone who has fungal toenails.  Walking around barefoot in the same room or sleeping in the same bed is unlikely to transfer the organism.  It is important to realize, however, that fungus can spread easily from one nail to the next on the same foot.  HOW DO WE TREAT THIS?  There are several ways to treat this condition.  Treatment may depend on many factors such as age, medications, pregnancy, liver and kidney conditions, etc.  It is best to ask your doctor which options are available to you.  1. No treatment.   Unlike many other medical concerns, you can live with this condition.  However for many people this can be a painful condition and  may lead to ingrown toenails or a bacterial infection.  It is recommended that you keep the nails cut short to help reduce the amount of fungal nail. 2. Topical treatment.  These range from herbal remedies to prescription strength nail lacquers.  About 40-50% effective, topicals require twice daily application for approximately 9 to 12 months or until an entirely new nail has grown out.  The most effective topicals are medical grade medications available through physicians offices. 3. Oral antifungal medications.  With an 80-90% cure rate, the most common oral medication requires 3 to 4 months of therapy and stays in your system for a year as the new nail grows out.  Oral antifungal medications do require   blood work to make sure it is a safe drug for you.  A liver function panel will be performed prior to starting the medication and after the first month of treatment.  It is important to have the blood work performed to avoid any harmful side effects.  In general, this medication safe but blood work is required. 4. Laser Therapy.  This treatment is performed by applying a specialized laser to the affected nail plate.  This therapy is noninvasive, fast, and non-painful.  It is not covered by insurance and is therefore, out of pocket.  The results have been very good with a 80-95% cure rate.  [facility] is the only practice in the area to offer this therapy. 5. Permanent Nail Avulsion.  Removing the entire nail so that a new nail will not grow back. 

## 2018-05-08 ENCOUNTER — Encounter: Payer: Self-pay | Admitting: Podiatry

## 2018-05-08 NOTE — Progress Notes (Signed)
Subjective: Gary Butler presents today referred by [name], MD with history of diabetes. He relates one month history of elongated, painful, discolored, thick toenail right great toe.  Pain is aggravated when he wears enclosed shoe gear.  Past Medical History:  Diagnosis Date  . Arthritis   . Constipation   . Diabetes mellitus   . Heart murmur   . History of gout   . History of skin cancer   . Hypertension   . OSA (obstructive sleep apnea)   . Psoriatic arthritis ([place])   . Rectal fissure   . Sleep apnea   . Tinnitus     Patient Active Problem List   Diagnosis Date Noted  . Dyspnea on exertion 02/19/2017  . Bilateral lower extremity edema 07/06/2015  . CHF (congestive heart failure) ([place]) 06/27/2015  . Fungal dermatitis 06/27/2015  . Edema 06/20/2015  . Increasing shortness of breath 06/20/2015  . Lumbar radiculopathy, acute 03/09/2015  . Post-viral cough syndrome 02/15/2015  . Essential hypertension 12/27/2014  . Type 2 diabetes mellitus without complication, without long-term current use of insulin ([place]) 12/27/2014  . Non-suppurative otitis media 06/28/2014  . Mild aortic stenosis 06/28/2014  . Memory loss 04/29/2014  . Tinnitus of both ears 02/17/2014  . Wellness examination 12/04/2013  . Constipation due to slow transit 08/18/2013  . Morbid obesity due to excess calories ([place]) 06/23/2013  . S/P left TKA 06/22/2013  . Meniere disease 04/21/2013  . Benign paroxysmal positional vertigo 04/21/2013  . Psoriatic arthritis ([place]) 05/14/2012  . General medical exam 01/28/2012  . Cellulitis of eyelid 09/25/2011  . Diverticulitis 06/20/2011  . Erythrocytosis 05/18/2011  . Sinusitis, acute 03/14/2011  . S/P TKR (total knee replacement) 12/31/2010  . Obesity 12/31/2010  . Hyperlipidemia 12/22/2010  . BPH (benign prostatic hyperplasia) 12/22/2010  . Gout 12/22/2010  . Obstructive sleep apnea 04/04/2009  . HYPERTENSION 04/04/2009    Past Surgical History:   Procedure Laterality Date  . addenoids    . JOINT REPLACEMENT  2012   RT KNEE  . LUNG BIOPSY     20 YRS AGO  . TONSILLECTOMY    . TOTAL KNEE ARTHROPLASTY Bilateral 06/22/2013   Procedure: LEFT TOTAL KNEE ARTHROPLASTY;  MEDIAL SOFT TISSUE EXPLORATION SAPHENOUS NEURECTOMY RIGHT KNEE;  Surgeon: Mauri Pole, MD;  Location: [facility];  Service: Orthopedics;  Laterality: Bilateral;     Current Outpatient Medications:  .  Albuterol Sulfate (PROAIR RESPICLICK) 673 (90 Base) MCG/ACT AEPB, Inhale 2 puffs into the lungs every 6 (six) hours as needed (for wheezing or shortness of breath)., Disp: 1 each, Rfl: 3 .  allopurinol (ZYLOPRIM) 300 MG tablet, TAKE 1 TABLET(300 MG) BY MOUTH DAILY, Disp: 30 tablet, Rfl: 0 .  aspirin 81 MG tablet, Take 81 mg by mouth daily., Disp: , Rfl:  .  atorvastatin (LIPITOR) 40 MG tablet, TAKE 1 TABLET(40 MG) BY MOUTH EVERY EVENING, Disp: 90 tablet, Rfl: 0 .  benzonatate (TESSALON) 100 MG capsule, Take 1 capsule (100 mg total) by mouth 3 (three) times daily as needed for cough., Disp: 30 capsule, Rfl: 0 .  bisoprolol (ZEBETA) 5 MG tablet, Take 1 tablet (5 mg total) by mouth daily., Disp: 30 tablet, Rfl: 11 .  cetirizine (ZYRTEC) 10 MG tablet, TAKE 1 TABLET(10 MG) BY MOUTH DAILY, Disp: 30 tablet, Rfl: 0 .  chlorpheniramine-HYDROcodone (TUSSIONEX PENNKINETIC ER) 10-8 MG/5ML SUER, Take 5 mLs by mouth every 12 (twelve) hours as needed., Disp: 70 mL, Rfl: 0 .  Cholecalciferol (VITAMIN D) 2000  UNITS tablet, Take 2,000 Units by mouth daily., Disp: , Rfl:  .  Cyanocobalamin (B-12) 2000 MCG TABS, Take by mouth., Disp: , Rfl:  .  donepezil (ARICEPT) 5 MG tablet, Take 1/2 tablet daily for 1 month, then increase to 1 tablet daily, Disp: 30 tablet, Rfl: 11 .  doxycycline (VIBRAMYCIN) 100 MG capsule, Take 1 capsule (100 mg total) by mouth 2 (two) times daily., Disp: 14 capsule, Rfl: 0 .  etodolac (LODINE) 400 MG tablet, Take by mouth., Disp: , Rfl:  .  fluticasone (FLONASE) 50 MCG/ACT  nasal spray, Place 2 sprays into both nostrils daily., Disp: 16 g, Rfl: 6 .  folic acid (FOLVITE) 1 MG tablet, Take 1 mg by mouth daily. , Disp: , Rfl:  .  furosemide (LASIX) 40 MG tablet, Take 1 tablet (40 mg total) by mouth daily. (Patient taking differently: Take 40 mg by mouth every other day. ), Disp: 30 tablet, Rfl: 0 .  meloxicam (MOBIC) 15 MG tablet, TAKE 1 TABLET(15 MG) BY MOUTH DAILY, Disp: 90 tablet, Rfl: 0 .  methotrexate (RHEUMATREX) 2.5 MG tablet, Take 15 mg by mouth once a week. , Disp: , Rfl:  .  MYRBETRIQ 25 MG TB24 tablet, , Disp: , Rfl:  .  nystatin ointment (MYCOSTATIN), Apply 1 application topically 2 (two) times daily. (Patient taking differently: Apply 1 application topically 2 (two) times daily as needed. ), Disp: 90 g, Rfl: 1 .  omeprazole (PRILOSEC) 40 MG capsule, TAKE 1 CAPSULE 30-60 MINUTES BEFORE YOUR FIRST AND LAST MEAL OF THE DAY, Disp: 180 capsule, Rfl: 1 .  pioglitazone (ACTOS) 30 MG tablet, TAKE 1 TABLET BY MOUTH DAILY, Disp: 90 tablet, Rfl: 0 .  potassium chloride (MICRO-K) 10 MEQ CR capsule, TAKE 1 CAPSULE BY MOUTH EVERY DAY, Disp: 90 capsule, Rfl: 0 .  sertraline (ZOLOFT) 50 MG tablet, TAKE 1 TABLET(50 MG) BY MOUTH DAILY, Disp: 90 tablet, Rfl: 0 .  telmisartan (MICARDIS) 40 MG tablet, TAKE 1 TABLET(40 MG) BY MOUTH DAILY, Disp: 30 tablet, Rfl: 11 .  tiZANidine (ZANAFLEX) 4 MG tablet, Take 1 tablet (4 mg total) by mouth every 6 (six) hours as needed for muscle spasms., Disp: 30 tablet, Rfl: 0 .  triamterene-hydrochlorothiazide (MAXZIDE) 75-50 MG tablet, TAKE 1/2 TABLET BY MOUTH EVERY MORNING, Disp: 45 tablet, Rfl: 1 .  UNABLE TO FIND, Med Name: BiPap with [facility], Disp: , Rfl:   Allergies  Allergen Reactions  . Lyrica [Pregabalin] Swelling  . Penicillins Swelling  . Levofloxacin Rash    Social History   Occupational History  . 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 Smoking status: Never  Smoker  . Smokeless tobacco: Never Used  Substance and Sexual Activity  . Alcohol use: Yes    Alcohol/week: 0.0 standard drinks    Comment: Once a month  . Drug use: No  . Sexual activity: Never    Family History  Problem Relation Age of Onset  . Emphysema Father   . Clotting disorder Father   . Arthritis Father   . Heart disease Mother   . Arthritis Mother   . Arthritis Maternal Grandmother   . Hypertension Maternal Grandmother   . Diabetes Maternal Grandmother   . Arthritis Paternal Grandmother   . Diabetes Maternal Aunt   . Diabetes Paternal Aunt     Immunization History  Administered Date(s) Administered  . Influenza, High Dose Seasonal PF 12/09/2017  . Influenza,inj,Quad PF,6+  Mos 12/04/2013, 02/10/2015, 12/13/2015, 11/28/2016  . Influenza-Unspecified 11/22/2010, 12/14/2011, 11/11/2012  . Pneumococcal Conjugate-13 03/27/2013  . Pneumococcal Polysaccharide-23 10/17/2008, 02/20/2016  . Td 02/17/2014  . Zoster 11/23/2009    Review of systems: Positive Findings in bold print.  Constitutional:  chills, fatigue, fever, sweats, weight change Communication: Optometrist, sign Ecologist, hand writing, iPad/Android device Head: headaches, head injury Eyes: changes in vision, eye pain, glaucoma, cataracts, macular degeneration, diplopia, glare,  light sensitivity, eyeglasses or contacts, blindness Ears nose mouth throat: hearing impaired, hearing aids,  ringing in ears, deaf, sign language,  vertigo,   nosebleeds,  rhinitis,  cold sores, snoring, swollen glands Cardiovascular: HTN, edema, arrhythmia, pacemaker in place, defibrillator in place, chest pain/tightness, chronic anticoagulation, blood clot, heart failure, MI Peripheral Vascular: leg cramps, varicose veins, blood clots, lymphedema, varicosities Respiratory:  difficulty breathing, denies congestion, SOB on exertion, wheezing, cough, emphysema Gastrointestinal: change in appetite or weight, abdominal pain,  constipation, diarrhea, nausea, vomiting, vomiting blood, change in bowel habits, abdominal pain, jaundice, rectal bleeding, hemorrhoids, GERD Genitourinary:  nocturia,  pain on urination, polyuria,  blood in urine, Foley catheter, urinary urgency, ESRD on hemodialysis Musculoskeletal: amputation, cramping, stiff joints, painful joints, decreased joint motion, fractures, OA, gout, hemiplegia, paraplegia, uses cane, wheelchair bound, uses walker, uses rollator Skin: +changes in toenails, color change, dryness, itching, mole changes,  rash, wound(s) Neurological: headaches, numbness in feet, paresthesias in feet, burning in feet, fainting,  seizures, change in speech. denies headaches, memory problems/poor historian, cerebral palsy, weakness, paralysis, CVA, TIA Endocrine: diabetes, hypothyroidism, hyperthyroidism,  goiter, dry mouth, flushing, heat intolerance,  cold intolerance,  excessive thirst, denies polyuria,  nocturia Hematological:  easy bleeding, excessive bleeding, easy bruising, enlarged lymph nodes, on long term blood thinner, history of past transusions Allergy/immunological:  hives, eczema, frequent infections, multiple drug allergies, seasonal allergies, transplant recipient, multiple food allergies Psychiatric:  anxiety, depression, mood disorder, suicidal ideations, hallucinations, insomnia  Objective: Vascular Examination: Capillary refill time immediate x 10 digits.  Dorsalis pedis pulses palpable b/l  Posterior tibial pulses faintly palpable b/l  Digital hair absent  x 10 digits  Skin temperature gradient WNL b/l  Venous Insufficiency with BLE edema.  Dermatological Examination: Skin with normal turgor, texture and tone b/l  Venous stasis skin changes b/l.  Toenails right great toe discolored, moderately thick, dystrophic with subungual debris and pain with palpation to nailbeds due to thickness of nails. Remaining toenails elongated, yellow with minimal subungual  debris.   Musculoskeletal: Muscle strength 5/5 to all LE muscle groups  Neurological: Sensation intact with 10 gram monofilament Vibratory sensation intact.  Assessment: 1. Painful onychomycosis toenails 1-5 b/l  2. Venous insuffiency 3. NIDDM  Plan: 1. Discussed diabetic foot care principles. Literature dispensed on today. 2. Toenails 1-5 b/l were debrided in length and girth without iatrogenic bleeding. 3. Advised patient to elevate feet when at rest to minimize edema. 4. Patient to continue soft, supportive shoe gear 5. Patient to report any pedal injuries to medical professional immediately. 6. Follow up 3 months.  7. Patient/POA to call should there be a concern in the interim.

## 2018-05-12 ENCOUNTER — Other Ambulatory Visit: Payer: Self-pay | Admitting: Internal Medicine

## 2018-05-12 ENCOUNTER — Other Ambulatory Visit: Payer: Self-pay | Admitting: Family Medicine

## 2018-05-25 ENCOUNTER — Other Ambulatory Visit: Payer: Self-pay | Admitting: Family Medicine

## 2018-05-28 ENCOUNTER — Other Ambulatory Visit: Payer: Self-pay

## 2018-05-28 ENCOUNTER — Telehealth (INDEPENDENT_AMBULATORY_CARE_PROVIDER_SITE_OTHER): Payer: Medicare Other | Admitting: Neurology

## 2018-05-28 DIAGNOSIS — R413 Other amnesia: Secondary | ICD-10-CM

## 2018-05-28 NOTE — Progress Notes (Signed)
Virtual Visit via Video Note The purpose of this virtual visit is to provide medical care while limiting exposure to the novel coronavirus.    Consent was obtained for video visit:  Yes.   Answered questions that patient had about telehealth interaction:  Yes.   I discussed the limitations, risks, security and privacy concerns of performing an evaluation and management service by telemedicine. I also discussed with the patient that there may be a patient responsible charge related to this service. The patient expressed understanding and agreed to proceed.  Pt location: Home Physician Location: office Name of referring provider:  [name], MD I connected with Layla Barter at patients initiation/request on 05/28/2018 at 10:30 AM EDT by video enabled telemedicine application and verified that I am speaking with the correct person using two identifiers. Pt MRN:  [id number] Pt DOB:  December 18, 1945 Video Participants:  Layla Barter;  Ames Coupe (wife)   History of Present Illness:  The patient was last seen in April 2018 for worsening memory. He underwent Neuropsychological testing in March 2018 which was entirely within normal limits. There were no impaired performances on testing. It was felt that most likely the patient's subjective cognitive complaints were secondary to normal aging. Since his last visit, he and his wife report that memory has continued to decline. Most concerning for him is driving, he would get in his car and would not remember how to get to his destination, even familiar places. He would sometimes get lost. He would have to input information to his GPS or have a printed list of instructions. His short-term memory is getting so bad, he forgets minute to minute, his wife reminds him of something and it is gone in 5 minutes. He cannot remember days of the week unless he looks at his watch. He forgets names of people he has known for 40 years. His wife would go out to do an errand  and he forgets she is gone and begins panicking, looking for her. He forgets telephone numbers. He denies missing bill payments or medications, stating he is organized with those. He reports mood is still up, he has always been happy-go-lucky. His wife reports he is more irritable, he gets angry at his clothes, every little thing that goes wrong is a cause for annoyance and irritation. No paranoia or hallucinations. He denies any headaches, dizziness, vision changes, no falls. He has occasional tingling in his feet. Sleep is good.   History on Initial Assessment 12/26/2014: This is a very pleasant 73 yo RH man with vascular risk factors including hypertension, hyperlipidemia, diabetes, with worsening short-term memory. He and his wife started noticing memory changes around 2014, he forgets names of people he has known for 40 years, he needs to keep a notepad in his car for reminders, he has word-finding difficulties and says the wrong word sometimes. He has more difficulties multi-tasking. His wife expressed frustration that she has to repeat everything she has told him, he would ask questions about something she had previously explained thoroughly to him. She reports "little details go through his head." Around 3 months ago, he was getting easily angry all the time, even when getting dressed. He was started on Sertraline, and his wife does not notice much of the aggravation he used to have. There is no family history of dementia. He denies any history of significant head injuries, he rarely drinks alcohol (once a month).  Diagnostic Data: TSH and B12 normal. MRI brain without contrast done  in 12/2014 showed moderate chronic microvascular disease, no acute changes.    Observations/Objective:  Patient is awake, alert, oriented x 3. No aphasia or dysarthria. Intact fluency and comprehension. Remote and recent memory intact. Able to name and repeat. Cranial nerves: Extraocular movements intact with no nystagmus.  No facial asymmetry. Motor: moves all extremities symmetrically. No incoordination on finger to nose testing. Gait: slightly wide-based, no ataxia. Negative Romberg test.  Montreal Cognitive Assessment  05/28/2018 09/02/2015 12/27/2014  Visuospatial/ Executive (0/5) - 5 5  Naming (0/3) - 3 3  Attention: Read list of digits (0/2) 1 2 2   Attention: Read list of letters (0/1) 1 1 1   Attention: Serial 7 subtraction starting at 100 (0/3) 2 3 3   Language: Repeat phrase (0/2) 2 2 2   Language : Fluency (0/1) 1 1 0  Abstraction (0/2) 2 2 2   Delayed Recall (0/5) 1 3 4   Orientation (0/6) 6 6 6   Total 16/22 (MOCA blind done over phone before video visit) 28 28  Adjusted Score (based on education) - 28 28    Assessment and Plan:   This is a pleasant 73 yo RH man with vascular risk factors including hypertension, hyperlipidemia, diabetes, who presented with worsening short-term memory. Neuropsychological testing in 2018 was entirely within normal limits, subjective cognitive complaints likely due to normal aging. 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 MOCA (blind) We discussed repeating Neuropsychological testing to assess for interval change. He was previously on Donepezil which was stopped after Neuropsychological testing in 2018, we agreed to hold off until results from repeat NP testing. We again discussed the importance of control of vascular risk factors, physical exercise, and brain stimulation exercises for brain health. He will follow-up after Neuropsych testing and knows to call for any changes.   Follow Up Instructions:   -I discussed the assessment and treatment plan with the patient. The patient was provided an opportunity to ask questions and all were answered. The patient agreed with the plan and demonstrated an understanding of the instructions.   The patient was advised to call back or seek an in-person evaluation if  the symptoms worsen or if the condition fails to improve as anticipated.    Total Time spent in visit with the patient was 25 minutes, of which more than 50% of the time was spent in counseling and/or coordinating care on the above.   Pt understands and agrees with the plan of care outlined.     Cameron Sprang, MD

## 2018-06-03 ENCOUNTER — Telehealth: Payer: Self-pay | Admitting: *Deleted

## 2018-06-03 NOTE — Telephone Encounter (Signed)
Pt called states he is a pt of Dr. Janus Molder and is having a flare of gout in the left 1st toe, and would like to know if he could increase his allopurinol.

## 2018-06-03 NOTE — Telephone Encounter (Signed)
I informed pt of Dr. Louretta Shorten retirement, and that he would benefit from an appt since we had not seen him for gout, and the allopurinol was previously prescribed by Dr. Birdie Riddle. Pt agreed and I transferred to scheduler.

## 2018-06-06 ENCOUNTER — Ambulatory Visit: Payer: Medicare Other

## 2018-06-06 ENCOUNTER — Ambulatory Visit: Payer: Medicare Other | Admitting: Podiatry

## 2018-06-06 ENCOUNTER — Encounter: Payer: Self-pay | Admitting: Podiatry

## 2018-06-06 ENCOUNTER — Other Ambulatory Visit: Payer: Self-pay

## 2018-06-06 DIAGNOSIS — M7752 Other enthesopathy of left foot: Secondary | ICD-10-CM

## 2018-06-06 DIAGNOSIS — M778 Other enthesopathies, not elsewhere classified: Secondary | ICD-10-CM

## 2018-06-06 DIAGNOSIS — M7751 Other enthesopathy of right foot: Secondary | ICD-10-CM

## 2018-06-06 DIAGNOSIS — M7750 Other enthesopathy of unspecified foot: Secondary | ICD-10-CM

## 2018-06-06 DIAGNOSIS — M779 Enthesopathy, unspecified: Principal | ICD-10-CM

## 2018-06-06 NOTE — Progress Notes (Signed)
Subjective:  Patient ID: Gary Butler, male    DOB: 07-Aug-1945,  MRN: [id number]  Chief Complaint  Patient presents with  . Toe Pain    Hallux bilateral (L>R) - tenderness x few weeks, no swelling, pain, injury, thought could be getting gout    73 y.o. male presents with the above complaint. States it feels like when he's had gout before.   Review of Systems: Negative except as noted in the HPI. Denies N/V/F/Ch.  Past Medical History:  Diagnosis Date  . Arthritis   . Constipation   . Diabetes mellitus   . Heart murmur   . History of gout   . History of skin cancer   . Hypertension   . OSA (obstructive sleep apnea)   . Psoriatic arthritis ([place])   . Rectal fissure   . Sleep apnea   . Tinnitus     Current Outpatient Medications:  .  Albuterol Sulfate (PROAIR RESPICLICK) 517 (90 Base) MCG/ACT AEPB, Inhale 2 puffs into the lungs every 6 (six) hours as needed (for wheezing or shortness of breath)., Disp: 1 each, Rfl: 3 .  allopurinol (ZYLOPRIM) 300 MG tablet, TAKE 1 TABLET(300 MG) BY MOUTH DAILY, Disp: 30 tablet, Rfl: 0 .  aspirin 81 MG tablet, Take 81 mg by mouth daily., Disp: , Rfl:  .  atorvastatin (LIPITOR) 40 MG tablet, TAKE 1 TABLET(40 MG) BY MOUTH EVERY EVENING, Disp: 90 tablet, Rfl: 0 .  benzonatate (TESSALON) 100 MG capsule, Take 1 capsule (100 mg total) by mouth 3 (three) times daily as needed for cough., Disp: 30 capsule, Rfl: 0 .  bisoprolol (ZEBETA) 5 MG tablet, TAKE 1 TABLET(5 MG) BY MOUTH DAILY, Disp: 30 tablet, Rfl: 11 .  cetirizine (ZYRTEC) 10 MG tablet, TAKE 1 TABLET(10 MG) BY MOUTH DAILY, Disp: 30 tablet, Rfl: 0 .  chlorpheniramine-HYDROcodone (TUSSIONEX PENNKINETIC ER) 10-8 MG/5ML SUER, Take 5 mLs by mouth every 12 (twelve) hours as needed., Disp: 70 mL, Rfl: 0 .  Cholecalciferol (VITAMIN D) 2000 UNITS tablet, Take 2,000 Units by mouth daily., Disp: , Rfl:  .  Cyanocobalamin (B-12) 2000 MCG TABS, Take by mouth., Disp: , Rfl:  .  donepezil (ARICEPT) 5 MG tablet,  Take 1/2 tablet daily for 1 month, then increase to 1 tablet daily, Disp: 30 tablet, Rfl: 11 .  doxycycline (VIBRAMYCIN) 100 MG capsule, Take 1 capsule (100 mg total) by mouth 2 (two) times daily., Disp: 14 capsule, Rfl: 0 .  etodolac (LODINE) 400 MG tablet, Take by mouth., Disp: , Rfl:  .  fluticasone (FLONASE) 50 MCG/ACT nasal spray, Place 2 sprays into both nostrils daily., Disp: 16 g, Rfl: 6 .  folic acid (FOLVITE) 1 MG tablet, Take 1 mg by mouth daily. , Disp: , Rfl:  .  furosemide (LASIX) 20 MG tablet, TAKE 1 TABLET(20 MG) BY MOUTH DAILY, Disp: 90 tablet, Rfl: 3 .  furosemide (LASIX) 40 MG tablet, Take 1 tablet (40 mg total) by mouth daily. (Patient taking differently: Take 40 mg by mouth every other day. ), Disp: 30 tablet, Rfl: 0 .  meloxicam (MOBIC) 15 MG tablet, TAKE 1 TABLET(15 MG) BY MOUTH DAILY, Disp: 90 tablet, Rfl: 0 .  methotrexate (RHEUMATREX) 2.5 MG tablet, Take 15 mg by mouth once a week. , Disp: , Rfl:  .  MYRBETRIQ 25 MG TB24 tablet, , Disp: , Rfl:  .  nystatin ointment (MYCOSTATIN), Apply 1 application topically 2 (two) times daily. (Patient taking differently: Apply 1 application topically 2 (two) times daily as needed. ),  Disp: 90 g, Rfl: 1 .  omeprazole (PRILOSEC) 40 MG capsule, TAKE 1 CAPSULE 30-60 MINUTES BEFORE YOUR FIRST AND LAST MEAL OF THE DAY, Disp: 180 capsule, Rfl: 1 .  pioglitazone (ACTOS) 30 MG tablet, TAKE 1 TABLET BY MOUTH DAILY, Disp: 90 tablet, Rfl: 0 .  potassium chloride (MICRO-K) 10 MEQ CR capsule, TAKE 1 CAPSULE BY MOUTH EVERY DAY, Disp: 90 capsule, Rfl: 0 .  sertraline (ZOLOFT) 50 MG tablet, TAKE 1 TABLET(50 MG) BY MOUTH DAILY, Disp: 90 tablet, Rfl: 0 .  telmisartan (MICARDIS) 40 MG tablet, TAKE 1 TABLET(40 MG) BY MOUTH DAILY, Disp: 30 tablet, Rfl: 11 .  tiZANidine (ZANAFLEX) 4 MG tablet, Take 1 tablet (4 mg total) by mouth every 6 (six) hours as needed for muscle spasms., Disp: 30 tablet, Rfl: 0 .  triamterene-hydrochlorothiazide (MAXZIDE) 75-50 MG  tablet, TAKE 1/2 TABLET BY MOUTH EVERY MORNING, Disp: 45 tablet, Rfl: 1 .  UNABLE TO FIND, Med Name: BiPap with [facility], Disp: , Rfl:   Social History   Tobacco Use  Smoking Status Never Smoker  Smokeless Tobacco Never Used    Allergies  Allergen Reactions  . Lyrica [Pregabalin] Swelling  . Penicillins Swelling  . Levofloxacin Rash   Objective:  There were no vitals filed for this visit. There is no height or weight on file to calculate BMI. Constitutional Well developed. Well nourished.  Vascular Dorsalis pedis pulses palpable bilaterally. Posterior tibial pulses palpable bilaterally. Capillary refill normal to all digits.  No cyanosis or clubbing noted. Pedal hair growth normal.  Neurologic Normal speech. Oriented to person, place, and time. Epicritic sensation to light touch grossly present bilaterally.  Dermatologic Nails well groomed and normal in appearance. No open wounds. No skin lesions.  Orthopedic: POP 1st MPJ bilat   Radiographs: None Assessment:   1. Capsulitis of metatarsophalangeal (MTP) joint    Plan:  Patient was evaluated and treated and all questions answered.  Capsuliis MPJ bilat, poss gout -Injection as below  Procedure: Joint Injection Location: Bilateral 1st MPJ joint Skin Prep: Alcohol. Injectate: 0.5 cc 1% lidocaine plain, 0.5 cc dexamethasone phosphate. Disposition: Patient tolerated procedure well. Injection site dressed with a band-aid.    Return if symptoms worsen or fail to improve.

## 2018-06-06 NOTE — Addendum Note (Signed)
Addended by: Clovis Riley E on: 06/06/2018 01:17 PM   Modules accepted: Orders

## 2018-06-06 NOTE — Patient Instructions (Signed)
Gout    Gout is a condition that causes painful swelling of the joints. Gout is a type of inflammation of the joints (arthritis). This condition is caused by having too much uric acid in the body. Uric acid is a chemical that forms when the body breaks down substances called purines. Purines are important for building body proteins.  When the body has too much uric acid, sharp crystals can form and build up inside the joints. This causes pain and swelling. Gout attacks can happen quickly and may be very painful (acute gout). Over time, the attacks can affect more joints and become more frequent (chronic gout). Gout can also cause uric acid to build up under the skin and inside the kidneys.  What are the causes?  This condition is caused by too much uric acid in your blood. This can happen because:   Your kidneys do not remove enough uric acid from your blood. This is the most common cause.   Your body makes too much uric acid. This can happen with some cancers and cancer treatments. It can also occur if your body is breaking down too many red blood cells (hemolytic anemia).   You eat too many foods that are high in purines. These foods include organ meats and some seafood. Alcohol, especially beer, is also high in purines.  A gout attack may be triggered by trauma or stress.  What increases the risk?  You are more likely to develop this condition if you:   Have a family history of gout.   Are male and middle-aged.   Are male and have gone through menopause.   Are obese.   Frequently drink alcohol, especially beer.   Are dehydrated.   Lose weight too quickly.   Have an organ transplant.   Have lead poisoning.   Take certain medicines, including aspirin, cyclosporine, diuretics, levodopa, and niacin.   Have kidney disease.   Have a skin condition called psoriasis.  What are the signs or symptoms?  An attack of acute gout happens quickly. It usually occurs in just one joint. The most common place is  the big toe. Attacks often start at night. Other joints that may be affected include joints of the feet, ankle, knee, fingers, wrist, or elbow. Symptoms of this condition may include:   Severe pain.   Warmth.   Swelling.   Stiffness.   Tenderness. The affected joint may be very painful to touch.   Shiny, red, or purple skin.   Chills and fever.  Chronic gout may cause symptoms more frequently. More joints may be involved. You may also have white or yellow lumps (tophi) on your hands or feet or in other areas near your joints.  How is this diagnosed?  This condition is diagnosed based on your symptoms, medical history, and physical exam. You may have tests, such as:   Blood tests to measure uric acid levels.   Removal of joint fluid with a thin needle (aspiration) to look for uric acid crystals.   X-rays to look for joint damage.  How is this treated?  Treatment for this condition has two phases: treating an acute attack and preventing future attacks. Acute gout treatment may include medicines to reduce pain and swelling, including:   NSAIDs.   Steroids. These are strong anti-inflammatory medicines that can be taken by mouth (orally) or injected into a joint.   Colchicine. This medicine relieves pain and swelling when it is taken soon after an attack. It   can be given by mouth or through an IV.  Preventive treatment may include:   Daily use of smaller doses of NSAIDs or colchicine.   Use of a medicine that reduces uric acid levels in your blood.   Changes to your diet. You may need to see a dietitian about what to eat and drink to prevent gout.  Follow these instructions at home:  During a gout attack     If directed, put ice on the affected area:  ? Put ice in a plastic bag.  ? Place a towel between your skin and the bag.  ? Leave the ice on for 20 minutes, 2-3 times a day.   Raise (elevate) the affected joint above the level of your heart as often as possible.   Rest the joint as much as possible.  If the affected joint is in your leg, you may be given crutches to use.   Follow instructions from your health care provider about eating or drinking restrictions.  Avoiding future gout attacks   Follow a low-purine diet as told by your dietitian or health care provider. Avoid foods and drinks that are high in purines, including liver, kidney, anchovies, asparagus, herring, mushrooms, mussels, and beer.   Maintain a healthy weight or lose weight if you are overweight. If you want to lose weight, talk with your health care provider. It is important that you do not lose weight too quickly.   Start or maintain an exercise program as told by your health care provider.  Eating and drinking   Drink enough fluids to keep your urine pale yellow.   If you drink alcohol:  ? Limit how much you use to:   0-1 drink a day for women.   0-2 drinks a day for men.  ? Be aware of how much alcohol is in your drink. In the [place] one drink equals one 12 oz bottle of beer (355 mL) one 5 oz glass of wine (148 mL), or one 1 oz glass of hard liquor (44 mL).  General instructions   Take over-the-counter and prescription medicines only as told by your health care provider.   Do not drive or use heavy machinery while taking prescription pain medicine.   Return to your normal activities as told by your health care provider. Ask your health care provider what activities are safe for you.   Keep all follow-up visits as told by your health care provider. This is important.  Contact a health care provider if you have:   Another gout attack.   Continuing symptoms of a gout attack after 10 days of treatment.   Side effects from your medicines.   Chills or a fever.   Burning pain when you urinate.   Pain in your lower back or belly.  Get help right away if you:   Have severe or uncontrolled pain.   Cannot urinate.  Summary   Gout is painful swelling of the joints caused by inflammation.   The most common site of pain is the big  toe, but it can affect other joints in the body.   Medicines and dietary changes can help to prevent and treat gout attacks.  This information is not intended to replace advice given to you by your health care provider. Make sure you discuss any questions you have with your health care provider.  Document Released: 01/27/2000 Document Revised: 08/21/2017 Document Reviewed: 08/21/2017  Elsevier Interactive Patient Education  2019 [employer]

## 2018-06-11 ENCOUNTER — Telehealth: Payer: Self-pay | Admitting: Family Medicine

## 2018-06-11 ENCOUNTER — Other Ambulatory Visit: Payer: Self-pay | Admitting: Family Medicine

## 2018-06-11 NOTE — Telephone Encounter (Signed)
Pharmacy has called back after speaking to pt, pt reports picking up Rx 3 more times after 3/3, states he continued taking Rx 2 times a day then 1 a day (pharmacist does not know how long pt took Rx). Pt states that he has stopped taking Rx now, but thought it to be a good idea to keep some on hand. Pt reports he is having no issues and pharmacy has cancelled all refills on this Rx.

## 2018-06-11 NOTE — Telephone Encounter (Signed)
This medication absolutely should not have been refilled.  There were no refills provided on original 04/15/18 script and we did not authorize any refills.  Thankfully pt is ok but this may need to be reported to the pharmacy board

## 2018-06-11 NOTE — Telephone Encounter (Signed)
FYI, received a call from [facility], caller was verifying Rx doxycycline that was wrote on 3/3 stating that this was to be filled one time, however pt has pick up this Rx 2 more times. I advised this did not show refills, but would make the provider aware. She stated that this may have been their error and was going to call the pt to see if her was still taking this Rx and let us know.

## 2018-06-24 ENCOUNTER — Telehealth: Payer: Self-pay | Admitting: Family Medicine

## 2018-06-24 NOTE — Telephone Encounter (Signed)
I have placed a parking placard in the bin with a charge sheet.

## 2018-06-25 NOTE — Telephone Encounter (Signed)
Picked up from the back, made a copy, and mailed to the pt.Marland KitchenELEA

## 2018-06-25 NOTE — Telephone Encounter (Signed)
fyi

## 2018-06-25 NOTE — Telephone Encounter (Signed)
Paperwork given to PCP for signature and completion

## 2018-06-25 NOTE — Telephone Encounter (Signed)
Form completed and placed in basket  

## 2018-07-11 ENCOUNTER — Other Ambulatory Visit: Payer: Self-pay | Admitting: Family Medicine

## 2018-08-07 ENCOUNTER — Ambulatory Visit: Payer: Medicare Other | Admitting: Podiatry

## 2018-08-08 ENCOUNTER — Encounter: Payer: Self-pay | Admitting: Podiatry

## 2018-08-08 ENCOUNTER — Other Ambulatory Visit: Payer: Self-pay

## 2018-08-08 ENCOUNTER — Ambulatory Visit (INDEPENDENT_AMBULATORY_CARE_PROVIDER_SITE_OTHER): Payer: Medicare Other | Admitting: Podiatry

## 2018-08-08 DIAGNOSIS — B351 Tinea unguium: Secondary | ICD-10-CM | POA: Diagnosis not present

## 2018-08-08 DIAGNOSIS — M79674 Pain in right toe(s): Secondary | ICD-10-CM

## 2018-08-08 DIAGNOSIS — M79675 Pain in left toe(s): Secondary | ICD-10-CM

## 2018-08-08 NOTE — Patient Instructions (Signed)
Diabetes Mellitus and Foot Care Foot care is an important part of your health, especially when you have diabetes. Diabetes may cause you to have problems because of poor blood flow (circulation) to your feet and legs, which can cause your skin to:  Become thinner and drier.  Break more easily.  Heal more slowly.  Peel and crack. You may also have nerve damage (neuropathy) in your legs and feet, causing decreased feeling in them. This means that you may not notice minor injuries to your feet that could lead to more serious problems. Noticing and addressing any potential problems early is the best way to prevent future foot problems. How to care for your feet Foot hygiene  Wash your feet daily with warm water and mild soap. Do not use hot water. Then, pat your feet and the areas between your toes until they are completely dry. Do not soak your feet as this can dry your skin.  Trim your toenails straight across. Do not dig under them or around the cuticle. File the edges of your nails with an emery board or nail file.  Apply a moisturizing lotion or petroleum jelly to the skin on your feet and to dry, brittle toenails. Use lotion that does not contain alcohol and is unscented. Do not apply lotion between your toes. Shoes and socks  Wear clean socks or stockings every day. Make sure they are not too tight. Do not wear knee-high stockings since they may decrease blood flow to your legs.  Wear shoes that fit properly and have enough cushioning. Always look in your shoes before you put them on to be sure there are no objects inside.  To break in new shoes, wear them for just a few hours a day. This prevents injuries on your feet. Wounds, scrapes, corns, and calluses  Check your feet daily for blisters, cuts, bruises, sores, and redness. If you cannot see the bottom of your feet, use a mirror or ask someone for help.  Do not cut corns or calluses or try to remove them with medicine.  If you  find a minor scrape, cut, or break in the skin on your feet, keep it and the skin around it clean and dry. You may clean these areas with mild soap and water. Do not clean the area with peroxide, alcohol, or iodine.  If you have a wound, scrape, corn, or callus on your foot, look at it several times a day to make sure it is healing and not infected. Check for: ? Redness, swelling, or pain. ? Fluid or blood. ? Warmth. ? Pus or a bad smell. General instructions  Do not cross your legs. This may decrease blood flow to your feet.  Do not use heating pads or hot water bottles on your feet. They may burn your skin. If you have lost feeling in your feet or legs, you may not know this is happening until it is too late.  Protect your feet from hot and cold by wearing shoes, such as at the beach or on hot pavement.  Schedule a complete foot exam at least once a year (annually) or more often if you have foot problems. If you have foot problems, report any cuts, sores, or bruises to your health care provider immediately. Contact a health care provider if:  You have a medical condition that increases your risk of infection and you have any cuts, sores, or bruises on your feet.  You have an injury that is not   healing.  You have redness on your legs or feet.  You feel burning or tingling in your legs or feet.  You have pain or cramps in your legs and feet.  Your legs or feet are numb.  Your feet always feel cold.  You have pain around a toenail. Get help right away if:  You have a wound, scrape, corn, or callus on your foot and: ? You have pain, swelling, or redness that gets worse. ? You have fluid or blood coming from the wound, scrape, corn, or callus. ? Your wound, scrape, corn, or callus feels warm to the touch. ? You have pus or a bad smell coming from the wound, scrape, corn, or callus. ? You have a fever. ? You have a red line going up your leg. Summary  Check your feet every day  for cuts, sores, red spots, swelling, and blisters.  Moisturize feet and legs daily.  Wear shoes that fit properly and have enough cushioning.  If you have foot problems, report any cuts, sores, or bruises to your health care provider immediately.  Schedule a complete foot exam at least once a year (annually) or more often if you have foot problems. This information is not intended to replace advice given to you by your health care provider. Make sure you discuss any questions you have with your health care provider. Document Released: 01/27/2000 Document Revised: 03/13/2017 Document Reviewed: 03/02/2016 Elsevier Patient Education  2020 [employer]vier Inc.  

## 2018-08-10 ENCOUNTER — Other Ambulatory Visit: Payer: Self-pay | Admitting: Family Medicine

## 2018-08-11 ENCOUNTER — Ambulatory Visit: Payer: Medicare Other | Admitting: Neurology

## 2018-08-11 ENCOUNTER — Encounter

## 2018-08-16 NOTE — Progress Notes (Signed)
Subjective:  Gary Butler presents to clinic today with cc of  painful, thick, discolored, elongated toenails 1-5 b/l that become tender and cannot cut because of thickness. Pain is aggravated when wearing enclosed shoe gear and relieved with periodic professional debridement.  He voices no new pedal concerns on today's visit. He states he had an injection of his first "big toe joints". States they feel much better now.  Gary Minium, MD is his PCP.    Current Outpatient Medications:  .  Albuterol Sulfate (PROAIR RESPICLICK) 767 (90 Base) MCG/ACT AEPB, Inhale 2 puffs into the lungs every 6 (six) hours as needed (for wheezing or shortness of breath)., Disp: 1 each, Rfl: 3 .  allopurinol (ZYLOPRIM) 300 MG tablet, TAKE 1 TABLET(300 MG) BY MOUTH DAILY, Disp: 30 tablet, Rfl: 0 .  aspirin 81 MG tablet, Take 81 mg by mouth daily., Disp: , Rfl:  .  atorvastatin (LIPITOR) 40 MG tablet, TAKE 1 TABLET(40 MG) BY MOUTH EVERY EVENING, Disp: 90 tablet, Rfl: 0 .  benzonatate (TESSALON) 100 MG capsule, Take 1 capsule (100 mg total) by mouth 3 (three) times daily as needed for cough., Disp: 30 capsule, Rfl: 0 .  bisoprolol (ZEBETA) 5 MG tablet, TAKE 1 TABLET(5 MG) BY MOUTH DAILY, Disp: 30 tablet, Rfl: 11 .  chlorpheniramine-HYDROcodone (TUSSIONEX PENNKINETIC ER) 10-8 MG/5ML SUER, Take 5 mLs by mouth every 12 (twelve) hours as needed., Disp: 70 mL, Rfl: 0 .  Cholecalciferol (VITAMIN D) 2000 UNITS tablet, Take 2,000 Units by mouth daily., Disp: , Rfl:  .  Cyanocobalamin (B-12) 2000 MCG TABS, Take by mouth., Disp: , Rfl:  .  donepezil (ARICEPT) 5 MG tablet, Take 1/2 tablet daily for 1 month, then increase to 1 tablet daily, Disp: 30 tablet, Rfl: 11 .  doxycycline (VIBRAMYCIN) 100 MG capsule, Take 1 capsule (100 mg total) by mouth 2 (two) times daily., Disp: 14 capsule, Rfl: 0 .  etodolac (LODINE) 400 MG tablet, Take by mouth., Disp: , Rfl:  .  fluticasone (FLONASE) 50 MCG/ACT nasal spray, Place 2 sprays into  both nostrils daily., Disp: 16 g, Rfl: 6 .  folic acid (FOLVITE) 1 MG tablet, Take 1 mg by mouth daily. , Disp: , Rfl:  .  furosemide (LASIX) 20 MG tablet, TAKE 1 TABLET(20 MG) BY MOUTH DAILY, Disp: 90 tablet, Rfl: 3 .  furosemide (LASIX) 40 MG tablet, Take 1 tablet (40 mg total) by mouth daily. (Patient taking differently: Take 40 mg by mouth every other day. ), Disp: 30 tablet, Rfl: 0 .  methotrexate (RHEUMATREX) 2.5 MG tablet, Take 15 mg by mouth once a week. , Disp: , Rfl:  .  MYRBETRIQ 25 MG TB24 tablet, , Disp: , Rfl:  .  nystatin ointment (MYCOSTATIN), Apply 1 application topically 2 (two) times daily. (Patient taking differently: Apply 1 application topically 2 (two) times daily as needed. ), Disp: 90 g, Rfl: 1 .  omeprazole (PRILOSEC) 40 MG capsule, TAKE 1 CAPSULE 30-60 MINUTES BEFORE YOUR FIRST AND LAST MEAL OF THE DAY, Disp: 180 capsule, Rfl: 1 .  pioglitazone (ACTOS) 30 MG tablet, TAKE 1 TABLET BY MOUTH DAILY, Disp: 90 tablet, Rfl: 0 .  potassium chloride (MICRO-K) 10 MEQ CR capsule, TAKE 1 CAPSULE BY MOUTH EVERY DAY, Disp: 90 capsule, Rfl: 0 .  sertraline (ZOLOFT) 50 MG tablet, TAKE 1 TABLET(50 MG) BY MOUTH DAILY, Disp: 90 tablet, Rfl: 0 .  telmisartan (MICARDIS) 40 MG tablet, TAKE 1 TABLET(40 MG) BY MOUTH DAILY, Disp: 30 tablet, Rfl: 11 .  tiZANidine (ZANAFLEX) 4 MG tablet, Take 1 tablet (4 mg total) by mouth every 6 (six) hours as needed for muscle spasms., Disp: 30 tablet, Rfl: 0 .  triamterene-hydrochlorothiazide (MAXZIDE) 75-50 MG tablet, TAKE 1/2 TABLET BY MOUTH EVERY MORNING, Disp: 45 tablet, Rfl: 1 .  UNABLE TO FIND, Med Name: BiPap with [facility], Disp: , Rfl:  .  cetirizine (ZYRTEC) 10 MG tablet, TAKE 1 TABLET(10 MG) BY MOUTH DAILY, Disp: 30 tablet, Rfl: 0 .  meloxicam (MOBIC) 15 MG tablet, TAKE 1 TABLET(15 MG) BY MOUTH DAILY, Disp: 90 tablet, Rfl: 0   Allergies  Allergen Reactions  . Lyrica [Pregabalin] Swelling  . Penicillins Swelling  . Levofloxacin Rash      Objective: There were no vitals filed for this visit.  Physical Examination:  Vascular Examination: Capillary refill time immediate x 10 digits.  Palpable DP/PT pulses b/l.  Digital hair present b/l.  No edema noted b/l.  Skin temperature gradient WNL b/l.  Dermatological Examination: Skin with normal turgor, texture and tone b/l.  No open wounds b/l.  No interdigital macerations noted b/l.  Elongated, thick, discolored brittle toenails with subungual debris and pain on dorsal palpation of nailbeds 1-5 left, 2-5 right foot.  Anonychia right great toe with evidence of permanent total nail avulsion. Nailbed completely epithelialized and intact.  Musculoskeletal Examination: Muscle strength 5/5 to all muscle groups b/l.  No pain, crepitus or joint discomfort with active/passive ROM.  Neurological Examination: Sensation intact 5/5 b/l with 10 gram monofilament.  Vibratory sensation intact b/l.  Proprioceptive sensation intact b/l.  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 Toenails 1-5 left, 2-5 right foot.were debrided in length and girth without iatrogenic laceration. 2.  Continue soft, supportive shoe gear daily. 3.  Report any pedal injuries to medical professional. 4.  Follow up 3 months. 5.  Patient/POA to call should there be a question/concern in there interim.

## 2018-08-19 ENCOUNTER — Ambulatory Visit (INDEPENDENT_AMBULATORY_CARE_PROVIDER_SITE_OTHER): Payer: Medicare Other | Admitting: Pulmonary Disease

## 2018-08-19 ENCOUNTER — Other Ambulatory Visit: Payer: Self-pay

## 2018-08-19 ENCOUNTER — Encounter: Payer: Self-pay | Admitting: Pulmonary Disease

## 2018-08-19 VITALS — BP 130/70 | HR 69 | Temp 97.6°F | Ht 72.0 in | Wt 358.6 lb

## 2018-08-19 DIAGNOSIS — G4733 Obstructive sleep apnea (adult) (pediatric): Secondary | ICD-10-CM | POA: Diagnosis not present

## 2018-08-19 NOTE — Patient Instructions (Signed)
Obstructive sleep apnea adequately treated with BIPAP therapy  Continue with your excellent compliance Regular exercises  I will see you back in 6 months Call with any significant concerns

## 2018-08-19 NOTE — Progress Notes (Signed)
Gary Butler    [id number]    1945-08-19  Primary Care Physician:Tabori, Aundra Millet, MD  Referring Physician: [name], MD [address] [place],  [place] [address]  Chief complaint:   Follow-up of obstructive sleep apnea Continues to be compliant with treatment  HPI: History of obstructive sleep apnea, adequately treated with BiPAP therapy BiPAP of 21/17 Was previously on CPAP Compliance remains excellent Followed by Dr. Melvyn Novas for shortness of breath  History of rheumatoid arthritis  Pets: dog Occupation: Teacher/principal  No significant recent travel  Outpatient Encounter Medications as of 08/19/2018  Medication Sig  . Albuterol Sulfate (PROAIR RESPICLICK) 659 (90 Base) MCG/ACT AEPB Inhale 2 puffs into the lungs every 6 (six) hours as needed (for wheezing or shortness of breath).  Marland Kitchen allopurinol (ZYLOPRIM) 300 MG tablet TAKE 1 TABLET(300 MG) BY MOUTH DAILY  . aspirin 81 MG tablet Take 81 mg by mouth daily.  Marland Kitchen atorvastatin (LIPITOR) 40 MG tablet TAKE 1 TABLET(40 MG) BY MOUTH EVERY EVENING  . benzonatate (TESSALON) 100 MG capsule Take 1 capsule (100 mg total) by mouth 3 (three) times daily as needed for cough.  . bisoprolol (ZEBETA) 5 MG tablet TAKE 1 TABLET(5 MG) BY MOUTH DAILY  . cetirizine (ZYRTEC) 10 MG tablet TAKE 1 TABLET(10 MG) BY MOUTH DAILY  . chlorpheniramine-HYDROcodone (TUSSIONEX PENNKINETIC ER) 10-8 MG/5ML SUER Take 5 mLs by mouth every 12 (twelve) hours as needed.  . Cholecalciferol (VITAMIN D) 2000 UNITS tablet Take 2,000 Units by mouth daily.  . Cyanocobalamin (B-12) 2000 MCG TABS Take by mouth.  . donepezil (ARICEPT) 5 MG tablet Take 1/2 tablet daily for 1 month, then increase to 1 tablet daily  . doxycycline (VIBRAMYCIN) 100 MG capsule Take 1 capsule (100 mg total) by mouth 2 (two) times daily.  Marland Kitchen etodolac (LODINE) 400 MG tablet Take by mouth.  . fluticasone (FLONASE) 50 MCG/ACT nasal spray Place 2 sprays into both nostrils daily.  .  folic acid (FOLVITE) 1 MG tablet Take 1 mg by mouth daily.   . furosemide (LASIX) 20 MG tablet TAKE 1 TABLET(20 MG) BY MOUTH DAILY  . furosemide (LASIX) 40 MG tablet Take 1 tablet (40 mg total) by mouth daily. (Patient taking differently: Take 40 mg by mouth every other day. )  . meloxicam (MOBIC) 15 MG tablet TAKE 1 TABLET(15 MG) BY MOUTH DAILY  . methotrexate (RHEUMATREX) 2.5 MG tablet Take 15 mg by mouth once a week.   Marland Kitchen MYRBETRIQ 25 MG TB24 tablet   . nystatin ointment (MYCOSTATIN) Apply 1 application topically 2 (two) times daily. (Patient taking differently: Apply 1 application topically 2 (two) times daily as needed. )  . omeprazole (PRILOSEC) 40 MG capsule TAKE 1 CAPSULE 30-60 MINUTES BEFORE YOUR FIRST AND LAST MEAL OF THE DAY  . pioglitazone (ACTOS) 30 MG tablet TAKE 1 TABLET BY MOUTH DAILY  . potassium chloride (MICRO-K) 10 MEQ CR capsule TAKE 1 CAPSULE BY MOUTH EVERY DAY  . sertraline (ZOLOFT) 50 MG tablet TAKE 1 TABLET(50 MG) BY MOUTH DAILY  . telmisartan (MICARDIS) 40 MG tablet TAKE 1 TABLET(40 MG) BY MOUTH DAILY  . tiZANidine (ZANAFLEX) 4 MG tablet Take 1 tablet (4 mg total) by mouth every 6 (six) hours as needed for muscle spasms.  Marland Kitchen triamterene-hydrochlorothiazide (MAXZIDE) 75-50 MG tablet TAKE 1/2 TABLET BY MOUTH EVERY MORNING  . UNABLE TO FIND Med Name: BiPap with [facility]   No facility-administered encounter medications on file as of  08/19/2018.     Allergies as of 08/19/2018 - Review Complete 08/19/2018  Allergen Reaction Noted  . Lyrica [pregabalin] Swelling 10/22/2010  . Penicillins Swelling   . Levofloxacin Rash     Past Medical History:  Diagnosis Date  . Arthritis   . Constipation   . Diabetes mellitus   . Heart murmur   . History of gout   . History of skin cancer   . Hypertension   . OSA (obstructive sleep apnea)   . Psoriatic arthritis ([place])   . Rectal fissure   . Sleep apnea   . Tinnitus     Past Surgical History:  Procedure Laterality Date  .  addenoids    . JOINT REPLACEMENT  2012   RT KNEE  . LUNG BIOPSY     20 YRS AGO  . TONSILLECTOMY    . 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 Emphysema Father   . Clotting disorder Father   . Arthritis Father   . Heart disease Mother   . Arthritis Mother   . Arthritis Maternal Grandmother   . Hypertension Maternal Grandmother   . Diabetes Maternal Grandmother   . Arthritis Paternal Grandmother   . Diabetes Maternal Aunt   . Diabetes Paternal Aunt     Social History   Socioeconomic History  . Marital status: Married    Spouse name: Not on file  . Number of children: 2  . Years of education: Not on file  . Highest education level: Not on file  Occupational History  . 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 Financial resource strain: Not on file  . Food insecurity    Worry: Not on file    Inability: Not on file  . Transportation needs    Medical: Not on file    Non-medical: Not on file  Tobacco Use  . Smoking status: Never Smoker  . Smokeless tobacco: Never Used  Substance and Sexual Activity  . Alcohol use: Yes    Alcohol/week: 0.0 standard drinks    Comment: Once a month  . Drug use: No  . Sexual activity: Never  Lifestyle  . Physical activity    Days per week: Not on file    Minutes per session: Not on file  . Stress: Not on file  Relationships  . Social Herbalist on phone: Not on file    Gets together: Not on file    Attends religious service: Not on file    Active member of club or organization: Not on file    Attends meetings of clubs or organizations: Not on file    Relationship status: Not on file  . Intimate partner  violence    Fear of current or ex partner: Not on file    Emotionally abused: Not on file    Physically abused: Not on file    Forced sexual activity: Not on file  Other Topics Concern  . Not on file  Social History Narrative   Alcohol- 1 to 2 per month.     Review of Systems  HENT: Negative.   Respiratory: Positive for apnea.   Cardiovascular: Negative.  Gastrointestinal: Negative.   Musculoskeletal: Positive for arthralgias and back pain.  Psychiatric/Behavioral: Positive for sleep disturbance.  All other systems reviewed and are negative.   Vitals:   08/19/18 1146  BP: 130/70  Pulse: 69  Temp: 97.6 F (36.4 C)  SpO2: 98%     Physical Exam  Constitutional: He is oriented to person, place, and time. He appears well-developed and well-nourished.  Obesity  Eyes: Pupils are equal, round, and reactive to light. Conjunctivae and EOM are normal.  Neck: Normal range of motion. Neck supple. No tracheal deviation present. No thyromegaly present.  Cardiovascular: Normal rate and regular rhythm.  Pulmonary/Chest: Effort normal. No respiratory distress. He has no wheezes. He has no rales.  Abdominal: Soft. He exhibits no distension. There is no abdominal tenderness.  Musculoskeletal: Normal range of motion.        General: Edema present.  Neurological: He is alert and oriented to person, place, and time.  Skin: Skin is warm and dry.    Data Reviewed: Compliance data reveals 100% compliance, AHI of 10.3, slight increase from 6.8 previously  Results of the [facility] flowsheet 01/21/2018  Sitting and reading 0  Watching TV 0  Sitting, inactive in a public place (e.g. a theatre or a meeting) 1  As a passenger in a car for an hour without a break 0  Lying down to rest in the afternoon when circumstances permit 0  Sitting and talking to someone 0  Sitting quietly after a lunch without alcohol 2  In a car, while stopped for a few minutes in traffic 0  Total score 3   Records  reviewed   Assessment:   Obstructive sleep apnea with very good compliance Optimally treated with BiPAP 21/70  Morbid obesity  Importance of regular exercise discussed Importance of weight maintenance discussed  Recommendations:  Continue BiPAP at current pressures  May consider elevating the head of the bed  Encourage lateral sleep  I will see him back in the office in 6 months Encouraged to call with any significant concerns  Sherrilyn Rist MD [facility] 08/19/2018, 12:00 PM  CC: [name], MD

## 2018-08-21 LAB — HM DIABETES EYE EXAM

## 2018-08-22 ENCOUNTER — Encounter: Payer: Self-pay | Admitting: General Practice

## 2018-09-09 ENCOUNTER — Other Ambulatory Visit: Payer: Self-pay | Admitting: Family Medicine

## 2018-09-18 ENCOUNTER — Other Ambulatory Visit: Payer: Self-pay | Admitting: Family Medicine

## 2018-10-09 ENCOUNTER — Other Ambulatory Visit: Payer: Self-pay | Admitting: Family Medicine

## 2018-10-24 ENCOUNTER — Other Ambulatory Visit: Payer: Self-pay

## 2018-10-24 ENCOUNTER — Ambulatory Visit (INDEPENDENT_AMBULATORY_CARE_PROVIDER_SITE_OTHER): Payer: Medicare Other | Admitting: *Deleted

## 2018-10-24 ENCOUNTER — Encounter: Payer: Self-pay | Admitting: Family Medicine

## 2018-10-24 DIAGNOSIS — Z23 Encounter for immunization: Secondary | ICD-10-CM

## 2018-11-07 ENCOUNTER — Ambulatory Visit (INDEPENDENT_AMBULATORY_CARE_PROVIDER_SITE_OTHER): Payer: Medicare Other | Admitting: Podiatry

## 2018-11-07 ENCOUNTER — Encounter: Payer: Self-pay | Admitting: Podiatry

## 2018-11-07 ENCOUNTER — Other Ambulatory Visit: Payer: Self-pay

## 2018-11-07 DIAGNOSIS — M79674 Pain in right toe(s): Secondary | ICD-10-CM

## 2018-11-07 DIAGNOSIS — M79675 Pain in left toe(s): Secondary | ICD-10-CM

## 2018-11-07 DIAGNOSIS — B351 Tinea unguium: Secondary | ICD-10-CM

## 2018-11-07 NOTE — Patient Instructions (Signed)
Diabetes Mellitus and Foot Care Foot care is an important part of your health, especially when you have diabetes. Diabetes may cause you to have problems because of poor blood flow (circulation) to your feet and legs, which can cause your skin to:  Become thinner and drier.  Break more easily.  Heal more slowly.  Peel and crack. You may also have nerve damage (neuropathy) in your legs and feet, causing decreased feeling in them. This means that you may not notice minor injuries to your feet that could lead to more serious problems. Noticing and addressing any potential problems early is the best way to prevent future foot problems. How to care for your feet Foot hygiene  Wash your feet daily with warm water and mild soap. Do not use hot water. Then, pat your feet and the areas between your toes until they are completely dry. Do not soak your feet as this can dry your skin.  Trim your toenails straight across. Do not dig under them or around the cuticle. File the edges of your nails with an emery board or nail file.  Apply a moisturizing lotion or petroleum jelly to the skin on your feet and to dry, brittle toenails. Use lotion that does not contain alcohol and is unscented. Do not apply lotion between your toes. Shoes and socks  Wear clean socks or stockings every day. Make sure they are not too tight. Do not wear knee-high stockings since they may decrease blood flow to your legs.  Wear shoes that fit properly and have enough cushioning. Always look in your shoes before you put them on to be sure there are no objects inside.  To break in new shoes, wear them for just a few hours a day. This prevents injuries on your feet. Wounds, scrapes, corns, and calluses  Check your feet daily for blisters, cuts, bruises, sores, and redness. If you cannot see the bottom of your feet, use a mirror or ask someone for help.  Do not cut corns or calluses or try to remove them with medicine.  If you  find a minor scrape, cut, or break in the skin on your feet, keep it and the skin around it clean and dry. You may clean these areas with mild soap and water. Do not clean the area with peroxide, alcohol, or iodine.  If you have a wound, scrape, corn, or callus on your foot, look at it several times a day to make sure it is healing and not infected. Check for: ? Redness, swelling, or pain. ? Fluid or blood. ? Warmth. ? Pus or a bad smell. General instructions  Do not cross your legs. This may decrease blood flow to your feet.  Do not use heating pads or hot water bottles on your feet. They may burn your skin. If you have lost feeling in your feet or legs, you may not know this is happening until it is too late.  Protect your feet from hot and cold by wearing shoes, such as at the beach or on hot pavement.  Schedule a complete foot exam at least once a year (annually) or more often if you have foot problems. If you have foot problems, report any cuts, sores, or bruises to your health care provider immediately. Contact a health care provider if:  You have a medical condition that increases your risk of infection and you have any cuts, sores, or bruises on your feet.  You have an injury that is not   healing.  You have redness on your legs or feet.  You feel burning or tingling in your legs or feet.  You have pain or cramps in your legs and feet.  Your legs or feet are numb.  Your feet always feel cold.  You have pain around a toenail. Get help right away if:  You have a wound, scrape, corn, or callus on your foot and: ? You have pain, swelling, or redness that gets worse. ? You have fluid or blood coming from the wound, scrape, corn, or callus. ? Your wound, scrape, corn, or callus feels warm to the touch. ? You have pus or a bad smell coming from the wound, scrape, corn, or callus. ? You have a fever. ? You have a red line going up your leg. Summary  Check your feet every day  for cuts, sores, red spots, swelling, and blisters.  Moisturize feet and legs daily.  Wear shoes that fit properly and have enough cushioning.  If you have foot problems, report any cuts, sores, or bruises to your health care provider immediately.  Schedule a complete foot exam at least once a year (annually) or more often if you have foot problems. This information is not intended to replace advice given to you by your health care provider. Make sure you discuss any questions you have with your health care provider. Document Released: 01/27/2000 Document Revised: 03/13/2017 Document Reviewed: 03/02/2016 Elsevier Patient Education  2020 Elsevier Inc.   Onychomycosis/Fungal Toenails  WHAT IS IT? An infection that lies within the keratin of your nail plate that is caused by a fungus.  WHY ME? Fungal infections affect all ages, sexes, races, and creeds.  There may be many factors that predispose you to a fungal infection such as age, coexisting medical conditions such as diabetes, or an autoimmune disease; stress, medications, fatigue, genetics, etc.  Bottom line: fungus thrives in a warm, moist environment and your shoes offer such a location.  IS IT CONTAGIOUS? Theoretically, yes.  You do not want to share shoes, nail clippers or files with someone who has fungal toenails.  Walking around barefoot in the same room or sleeping in the same bed is unlikely to transfer the organism.  It is important to realize, however, that fungus can spread easily from one nail to the next on the same foot.  HOW DO WE TREAT THIS?  There are several ways to treat this condition.  Treatment may depend on many factors such as age, medications, pregnancy, liver and kidney conditions, etc.  It is best to ask your doctor which options are available to you.  1. No treatment.   Unlike many other medical concerns, you can live with this condition.  However for many people this can be a painful condition and may lead to  ingrown toenails or a bacterial infection.  It is recommended that you keep the nails cut short to help reduce the amount of fungal nail. 2. Topical treatment.  These range from herbal remedies to prescription strength nail lacquers.  About 40-50% effective, topicals require twice daily application for approximately 9 to 12 months or until an entirely new nail has grown out.  The most effective topicals are medical grade medications available through physicians offices. 3. Oral antifungal medications.  With an 80-90% cure rate, the most common oral medication requires 3 to 4 months of therapy and stays in your system for a year as the new nail grows out.  Oral antifungal medications do require   blood work to make sure it is a safe drug for you.  A liver function panel will be performed prior to starting the medication and after the first month of treatment.  It is important to have the blood work performed to avoid any harmful side effects.  In general, this medication safe but blood work is required. 4. Laser Therapy.  This treatment is performed by applying a specialized laser to the affected nail plate.  This therapy is noninvasive, fast, and non-painful.  It is not covered by insurance and is therefore, out of pocket.  The results have been very good with a 80-95% cure rate.  [facility] is the only practice in the area to offer this therapy. 5. Permanent Nail Avulsion.  Removing the entire nail so that a new nail will not grow back. 

## 2018-11-12 NOTE — Progress Notes (Signed)
Subjective: Gary Butler presents today with painful, thick toenails 1-5 b/l that he cannot cut and which interfere with daily activities.  Pain is aggravated when wearing enclosed shoe gear.  [name], MD is his PCP.   Current Outpatient Medications on File Prior to Visit  Medication Sig Dispense Refill  . Albuterol Sulfate (PROAIR RESPICLICK) 123XX123 (90 Base) MCG/ACT AEPB Inhale 2 puffs into the lungs every 6 (six) hours as needed (for wheezing or shortness of breath). 1 each 3  . allopurinol (ZYLOPRIM) 300 MG tablet TAKE 1 TABLET(300 MG) BY MOUTH DAILY 30 tablet 0  . aspirin 81 MG tablet Take 81 mg by mouth daily.    Marland Kitchen atorvastatin (LIPITOR) 40 MG tablet TAKE 1 TABLET(40 MG) BY MOUTH EVERY EVENING 90 tablet 0  . benzonatate (TESSALON) 100 MG capsule Take 1 capsule (100 mg total) by mouth 3 (three) times daily as needed for cough. 30 capsule 0  . bisoprolol (ZEBETA) 5 MG tablet TAKE 1 TABLET(5 MG) BY MOUTH DAILY 30 tablet 11  . cetirizine (ZYRTEC) 10 MG tablet TAKE 1 TABLET(10 MG) BY MOUTH DAILY 30 tablet 0  . chlorpheniramine-HYDROcodone (TUSSIONEX PENNKINETIC ER) 10-8 MG/5ML SUER Take 5 mLs by mouth every 12 (twelve) hours as needed. 70 mL 0  . Cholecalciferol (VITAMIN D) 2000 UNITS tablet Take 2,000 Units by mouth daily.    . Cyanocobalamin (B-12) 2000 MCG TABS Take by mouth.    . donepezil (ARICEPT) 5 MG tablet Take 1/2 tablet daily for 1 month, then increase to 1 tablet daily 30 tablet 11  . doxycycline (VIBRAMYCIN) 100 MG capsule Take 1 capsule (100 mg total) by mouth 2 (two) times daily. 14 capsule 0  . etodolac (LODINE) 400 MG tablet Take by mouth.    . fluticasone (FLONASE) 50 MCG/ACT nasal spray Place 2 sprays into both nostrils daily. 16 g 6  . folic acid (FOLVITE) 1 MG tablet Take 1 mg by mouth daily.     . furosemide (LASIX) 20 MG tablet TAKE 1 TABLET(20 MG) BY MOUTH DAILY 90 tablet 3  . furosemide (LASIX) 40 MG tablet Take 1 tablet (40 mg total) by mouth daily. (Patient  taking differently: Take 40 mg by mouth every other day. ) 30 tablet 0  . meloxicam (MOBIC) 15 MG tablet TAKE 1 TABLET(15 MG) BY MOUTH DAILY 90 tablet 0  . methotrexate (RHEUMATREX) 2.5 MG tablet Take 15 mg by mouth once a week.     Marland Kitchen MYRBETRIQ 25 MG TB24 tablet     . nystatin ointment (MYCOSTATIN) Apply 1 application topically 2 (two) times daily. (Patient taking differently: Apply 1 application topically 2 (two) times daily as needed. ) 90 g 1  . omeprazole (PRILOSEC) 40 MG capsule TAKE 1 CAPSULE 30-60 MINUTES BEFORE YOUR FIRST AND LAST MEAL OF THE DAY 180 capsule 1  . pioglitazone (ACTOS) 30 MG tablet TAKE 1 TABLET BY MOUTH DAILY 90 tablet 0  . potassium chloride (MICRO-K) 10 MEQ CR capsule TAKE 1 CAPSULE BY MOUTH EVERY DAY 90 capsule 0  . sertraline (ZOLOFT) 50 MG tablet TAKE 1 TABLET(50 MG) BY MOUTH DAILY 90 tablet 0  . telmisartan (MICARDIS) 40 MG tablet TAKE 1 TABLET(40 MG) BY MOUTH DAILY 30 tablet 11  . tiZANidine (ZANAFLEX) 4 MG tablet Take 1 tablet (4 mg total) by mouth every 6 (six) hours as needed for muscle spasms. 30 tablet 0  . triamterene-hydrochlorothiazide (MAXZIDE) 75-50 MG tablet TAKE 1/2 TABLET BY MOUTH EVERY MORNING 45 tablet 1  .  UNABLE TO FIND Med Name: BiPap with [facility]     No current facility-administered medications on file prior to visit.     Allergies  Allergen Reactions  . Lyrica [Pregabalin] Swelling  . Penicillins Swelling  . Levofloxacin Rash    Objective: Vascular Examination: Capillary refill time immediate x 10 digits  Dorsalis pedis and Posterior tibial pulses palpable b/l  Digital hair present x 10 digits  Skin temperature gradient WNL b/l  Dermatological Examination: Skin with normal turgor, texture and tone b/l.  Toenails 1-5 b/l discolored, thick, dystrophic with subungual debris and pain with palpation to nailbeds due to thickness of nails.  Musculoskeletal: Muscle strength 5/5 to all LE muscle groups  No pain, crepitus or joint  limitation noted with ROM.   Neurological: Sensation intact 5/5 b/l with 10 gram monofilament.  Vibratory sensation intact b/l.   Assessment: Painful onychomycosis toenails 1-5 b/l   Plan: 1. Toenails 1-5 b/l were debrided in length and girth without iatrogenic bleeding. 2. Patient to continue soft, supportive shoe gear daily. 3. Patient to report any pedal injuries to medical professional immediately. 4. Follow up 3 months.  5. Patient/POA to call should there be a concern in the interim.

## 2018-12-09 ENCOUNTER — Other Ambulatory Visit: Payer: Self-pay | Admitting: General Practice

## 2018-12-09 NOTE — Telephone Encounter (Signed)
Pt last CPE 01/2018. Is requesting refills of the pended meds. No upcoming appts. Please advise.

## 2018-12-10 MED ORDER — POTASSIUM CHLORIDE ER 10 MEQ PO CPCR: ORAL_CAPSULE | ORAL | 0 refills | Status: DC

## 2018-12-10 MED ORDER — PIOGLITAZONE HCL 30 MG PO TABS: 30.0000 mg | ORAL_TABLET | Freq: Every day | ORAL | 0 refills | Status: DC

## 2018-12-10 MED ORDER — CETIRIZINE HCL 10 MG PO TABS: ORAL_TABLET | ORAL | 0 refills | Status: DC

## 2019-01-07 ENCOUNTER — Other Ambulatory Visit: Payer: Self-pay

## 2019-01-07 MED ORDER — MELOXICAM 15 MG PO TABS: ORAL_TABLET | ORAL | 0 refills | Status: DC

## 2019-01-07 MED ORDER — SERTRALINE HCL 50 MG PO TABS: ORAL_TABLET | ORAL | 0 refills | Status: DC

## 2019-01-07 MED ORDER — ATORVASTATIN CALCIUM 40 MG PO TABS: ORAL_TABLET | ORAL | 0 refills | Status: DC

## 2019-01-07 MED ORDER — CETIRIZINE HCL 10 MG PO TABS: ORAL_TABLET | ORAL | 0 refills | Status: DC

## 2019-01-12 ENCOUNTER — Other Ambulatory Visit: Payer: Self-pay | Admitting: Internal Medicine

## 2019-01-12 MED ORDER — OMEPRAZOLE 40 MG PO CPDR: DELAYED_RELEASE_CAPSULE | ORAL | 0 refills | Status: DC

## 2019-02-02 ENCOUNTER — Other Ambulatory Visit: Payer: Self-pay

## 2019-02-02 ENCOUNTER — Ambulatory Visit: Payer: Medicare Other | Admitting: Podiatry

## 2019-02-02 DIAGNOSIS — E119 Type 2 diabetes mellitus without complications: Secondary | ICD-10-CM | POA: Diagnosis not present

## 2019-02-02 DIAGNOSIS — M79674 Pain in right toe(s): Secondary | ICD-10-CM | POA: Diagnosis not present

## 2019-02-02 DIAGNOSIS — B351 Tinea unguium: Secondary | ICD-10-CM

## 2019-02-02 DIAGNOSIS — M79675 Pain in left toe(s): Secondary | ICD-10-CM | POA: Diagnosis not present

## 2019-02-02 NOTE — Patient Instructions (Signed)
Diabetes Mellitus and Foot Care Foot care is an important part of your health, especially when you have diabetes. Diabetes may cause you to have problems because of poor blood flow (circulation) to your feet and legs, which can cause your skin to:  Become thinner and drier.  Break more easily.  Heal more slowly.  Peel and crack. You may also have nerve damage (neuropathy) in your legs and feet, causing decreased feeling in them. This means that you may not notice minor injuries to your feet that could lead to more serious problems. Noticing and addressing any potential problems early is the best way to prevent future foot problems. How to care for your feet Foot hygiene  Wash your feet daily with warm water and mild soap. Do not use hot water. Then, pat your feet and the areas between your toes until they are completely dry. Do not soak your feet as this can dry your skin.  Trim your toenails straight across. Do not dig under them or around the cuticle. File the edges of your nails with an emery board or nail file.  Apply a moisturizing lotion or petroleum jelly to the skin on your feet and to dry, brittle toenails. Use lotion that does not contain alcohol and is unscented. Do not apply lotion between your toes. Shoes and socks  Wear clean socks or stockings every day. Make sure they are not too tight. Do not wear knee-high stockings since they may decrease blood flow to your legs.  Wear shoes that fit properly and have enough cushioning. Always look in your shoes before you put them on to be sure there are no objects inside.  To break in new shoes, wear them for just a few hours a day. This prevents injuries on your feet. Wounds, scrapes, corns, and calluses  Check your feet daily for blisters, cuts, bruises, sores, and redness. If you cannot see the bottom of your feet, use a mirror or ask someone for help.  Do not cut corns or calluses or try to remove them with medicine.  If you  find a minor scrape, cut, or break in the skin on your feet, keep it and the skin around it clean and dry. You may clean these areas with mild soap and water. Do not clean the area with peroxide, alcohol, or iodine.  If you have a wound, scrape, corn, or callus on your foot, look at it several times a day to make sure it is healing and not infected. Check for: ? Redness, swelling, or pain. ? Fluid or blood. ? Warmth. ? Pus or a bad smell. General instructions  Do not cross your legs. This may decrease blood flow to your feet.  Do not use heating pads or hot water bottles on your feet. They may burn your skin. If you have lost feeling in your feet or legs, you may not know this is happening until it is too late.  Protect your feet from hot and cold by wearing shoes, such as at the beach or on hot pavement.  Schedule a complete foot exam at least once a year (annually) or more often if you have foot problems. If you have foot problems, report any cuts, sores, or bruises to your health care provider immediately. Contact a health care provider if:  You have a medical condition that increases your risk of infection and you have any cuts, sores, or bruises on your feet.  You have an injury that is not   healing.  You have redness on your legs or feet.  You feel burning or tingling in your legs or feet.  You have pain or cramps in your legs and feet.  Your legs or feet are numb.  Your feet always feel cold.  You have pain around a toenail. Get help right away if:  You have a wound, scrape, corn, or callus on your foot and: ? You have pain, swelling, or redness that gets worse. ? You have fluid or blood coming from the wound, scrape, corn, or callus. ? Your wound, scrape, corn, or callus feels warm to the touch. ? You have pus or a bad smell coming from the wound, scrape, corn, or callus. ? You have a fever. ? You have a red line going up your leg. Summary  Check your feet every day  for cuts, sores, red spots, swelling, and blisters.  Moisturize feet and legs daily.  Wear shoes that fit properly and have enough cushioning.  If you have foot problems, report any cuts, sores, or bruises to your health care provider immediately.  Schedule a complete foot exam at least once a year (annually) or more often if you have foot problems. This information is not intended to replace advice given to you by your health care provider. Make sure you discuss any questions you have with your health care provider. Document Released: 01/27/2000 Document Revised: 03/13/2017 Document Reviewed: 03/02/2016 Elsevier Patient Education  2020 [employer]vier Inc.  

## 2019-02-09 ENCOUNTER — Encounter: Payer: Self-pay | Admitting: Podiatry

## 2019-02-09 ENCOUNTER — Other Ambulatory Visit: Payer: Self-pay

## 2019-02-09 MED ORDER — CETIRIZINE HCL 10 MG PO TABS: ORAL_TABLET | ORAL | 0 refills | Status: DC

## 2019-02-09 NOTE — Progress Notes (Signed)
Subjective:  Gary Butler presents to clinic today for preventative diabetic foot care with cc of  painful, thick, discolored, elongated toenails  of both feet that become tender and patient cannot cut because of thickness. Pain is aggravated when wearing enclosed shoe gear and relieved with periodic professional debridement.  Patient voices no new pedal concerns on today's visit.  Medications reviewed in chart.  Allergies  Allergen Reactions  . Lyrica [Pregabalin] Swelling  . Penicillins Swelling  . Levofloxacin Rash    Objective:  Physical Examination:  Vascular Examination: Capillary refill time immediate b/l.  Palpable DP/PT pulses b/l.  Digital hair present b/l.  No edema noted b/l.  Skin temperature gradient WNL b/l.  Dermatological Examination: Skin with normal turgor, texture and tone b/l.  No open wounds b/l.  No interdigital macerations noted b/l.  Elongated, thick, discolored brittle toenails with subungual debris and pain on dorsal palpation of nailbeds 1-5 b/l.  Musculoskeletal Examination: Muscle strength 5/5 to all muscle groups b/l.  No pain, crepitus or joint discomfort with active/passive ROM.  Neurological Examination: Sensation intact 5/5 b/l with 10 gram monofilament.  Vibratory sensation intact b/l.  Proprioceptive sensation intact b/l.  Assessment: Mycotic nail infection with pain 1-5 b/l  Plan: 1. No new findings. No new orders. 2. Toenails 1-5 b/l were debrided in length and girth without iatrogenic laceration. 2.  Continue soft, supportive shoe gear daily. 3.  Report any pedal injuries to medical professional. 4.  Follow up 3 months. 5.  Patient/POA to call should there be a question/concern in there interim.

## 2019-02-17 ENCOUNTER — Other Ambulatory Visit: Payer: Self-pay | Admitting: *Deleted

## 2019-02-17 MED ORDER — POTASSIUM CHLORIDE ER 10 MEQ PO CPCR: ORAL_CAPSULE | ORAL | 0 refills | Status: DC

## 2019-02-17 NOTE — Telephone Encounter (Signed)
Rx Potassium Cl 10 meq ER Last OV 04/15/2018 Last refill on 02/10/2019

## 2019-02-19 ENCOUNTER — Encounter: Payer: Self-pay | Admitting: Adult Health

## 2019-02-19 ENCOUNTER — Other Ambulatory Visit: Payer: Self-pay

## 2019-02-19 ENCOUNTER — Ambulatory Visit: Payer: Medicare PPO | Admitting: Adult Health

## 2019-02-19 DIAGNOSIS — G4733 Obstructive sleep apnea (adult) (pediatric): Secondary | ICD-10-CM

## 2019-02-19 NOTE — Progress Notes (Signed)
@Patient  ID: Gary Butler, male    DOB: 12-08-1945, 74 y.o.   MRN: [id number]  Chief Complaint  Patient presents with  . Follow-up    OSA    Referring provider: [name], MD  HPI: 74 year old male followed for obstructive sleep apnea on nocturnal BiPAP Medical history significant for rheumatoid arthritis  TEST/EVENTS :   02/19/2019 Follow up : OSA  Patient presents for a 1 year follow-up.  Patient has underlying obstructive sleep apnea.  He is on nocturnal BiPAP.  Patient says he is doing well on BiPAP.  He feels rested with no significant daytime sleepiness.  Feels that he benefits from BiPAP.  BiPAP download shows excellent compliance with 100% usage.  Daily average usage around 9 hours.  Patient is on IPAP 21 cm H2O.  A EPAP at 17 cm H2O. AHI 10.6. Patient says he is trying to work on a healthy weight.  Allergies  Allergen Reactions  . Lyrica [Pregabalin] Swelling  . Penicillins Swelling  . Levofloxacin Rash    Immunization History  Administered Date(s) Administered  . Fluad Quad(high Dose 65+) 10/24/2018  . Influenza, High Dose Seasonal PF 12/09/2017  . Influenza,inj,Quad PF,6+ Mos 12/04/2013, 02/10/2015, 12/13/2015, 11/28/2016  . Influenza-Unspecified 11/22/2010, 12/14/2011, 11/11/2012  . Pneumococcal Conjugate-13 03/27/2013  . Pneumococcal Polysaccharide-23 10/17/2008, 02/20/2016  . Td 02/17/2014  . Zoster 11/23/2009  . Zoster Recombinat (Shingrix) 02/06/2018    Past Medical History:  Diagnosis Date  . Arthritis   . Constipation   . Diabetes mellitus   . Heart murmur   . History of gout   . History of skin cancer   . Hypertension   . OSA (obstructive sleep apnea)   . Psoriatic arthritis ([place])   . Rectal fissure   . Sleep apnea   . Tinnitus     Tobacco History: Social History   Tobacco Use  Smoking Status Never Smoker  Smokeless Tobacco Never Used   Counseling given: Not Answered   Outpatient Medications Prior to Visit  Medication Sig  Dispense Refill  . Albuterol Sulfate (PROAIR RESPICLICK) 123XX123 (90 Base) MCG/ACT AEPB Inhale 2 puffs into the lungs every 6 (six) hours as needed (for wheezing or shortness of breath). 1 each 3  . allopurinol (ZYLOPRIM) 300 MG tablet TAKE 1 TABLET(300 MG) BY MOUTH DAILY 30 tablet 0  . aspirin 81 MG tablet Take 81 mg by mouth daily.    Marland Kitchen atorvastatin (LIPITOR) 40 MG tablet TAKE 1 TABLET(40 MG) BY MOUTH EVERY EVENING 90 tablet 0  . bisoprolol (ZEBETA) 5 MG tablet TAKE 1 TABLET(5 MG) BY MOUTH DAILY 30 tablet 11  . cetirizine (ZYRTEC) 10 MG tablet TAKE 1 TABLET(10 MG) BY MOUTH DAILY 30 tablet 0  . Cholecalciferol (VITAMIN D) 2000 UNITS tablet Take 2,000 Units by mouth daily.    . Cyanocobalamin (B-12) 2000 MCG TABS Take by mouth.    . donepezil (ARICEPT) 5 MG tablet Take 1/2 tablet daily for 1 month, then increase to 1 tablet daily 30 tablet 11  . etodolac (LODINE) 400 MG tablet Take by mouth.    . fluticasone (FLONASE) 50 MCG/ACT nasal spray Place 2 sprays into both nostrils daily. 16 g 6  . folic acid (FOLVITE) 1 MG tablet Take 1 mg by mouth daily.     . furosemide (LASIX) 20 MG tablet TAKE 1 TABLET(20 MG) BY MOUTH DAILY 90 tablet 3  . meloxicam (MOBIC) 15 MG tablet Take 1 tablet by mouth daily 90 tablet 0  . methotrexate (  RHEUMATREX) 2.5 MG tablet Take 15 mg by mouth once a week.     Marland Kitchen MYRBETRIQ 25 MG TB24 tablet     . nystatin ointment (MYCOSTATIN) Apply 1 application topically 2 (two) times daily. (Patient taking differently: Apply 1 application topically 2 (two) times daily as needed. ) 90 g 1  . omeprazole (PRILOSEC) 40 MG capsule TAKE 1 CAPSULE 30-60 MINUTES BEFORE YOUR FIRST AND LAST MEAL OF THE DAY 180 capsule 0  . pioglitazone (ACTOS) 30 MG tablet Take 1 tablet (30 mg total) by mouth daily. You must schedule an appt for additional refills 30 tablet 0  . potassium chloride (MICRO-K) 10 MEQ CR capsule TAKE 1 CAPSULE BY MOUTH EVERY DAY 30 capsule 0  . sertraline (ZOLOFT) 50 MG tablet TAKE 1  TABLET(50 MG) BY MOUTH DAILY 90 tablet 0  . telmisartan (MICARDIS) 40 MG tablet TAKE 1 TABLET(40 MG) BY MOUTH DAILY 30 tablet 11  . tiZANidine (ZANAFLEX) 4 MG tablet Take 1 tablet (4 mg total) by mouth every 6 (six) hours as needed for muscle spasms. 30 tablet 0  . triamterene-hydrochlorothiazide (MAXZIDE) 75-50 MG tablet TAKE 1/2 TABLET BY MOUTH EVERY MORNING 45 tablet 1  . UNABLE TO FIND Med Name: BiPap with [facility]    . chlorpheniramine-HYDROcodone (TUSSIONEX PENNKINETIC ER) 10-8 MG/5ML SUER Take 5 mLs by mouth every 12 (twelve) hours as needed. (Patient not taking: Reported on 02/19/2019) 70 mL 0  . benzonatate (TESSALON) 100 MG capsule Take 1 capsule (100 mg total) by mouth 3 (three) times daily as needed for cough. (Patient not taking: Reported on 02/19/2019) 30 capsule 0  . doxycycline (VIBRAMYCIN) 100 MG capsule Take 1 capsule (100 mg total) by mouth 2 (two) times daily. (Patient not taking: Reported on 02/19/2019) 14 capsule 0  . furosemide (LASIX) 40 MG tablet Take 1 tablet (40 mg total) by mouth daily. (Patient not taking: Reported on 02/19/2019) 30 tablet 0   No facility-administered medications prior to visit.     Review of Systems:   Constitutional:   No  weight loss, night sweats,  Fevers, chills, fatigue, or  lassitude.  HEENT:   No headaches,  Difficulty swallowing,  Tooth/dental problems, or  Sore throat,                No sneezing, itching, ear ache, nasal congestion, post nasal drip,   CV:  No chest pain,  Orthopnea, PND, swelling in lower extremities, anasarca, dizziness, palpitations, syncope.   GI  No heartburn, indigestion, abdominal pain, nausea, vomiting, diarrhea, change in bowel habits, loss of appetite, bloody stools.   Resp: No shortness of breath with exertion or at rest.  No excess mucus, no productive cough,  No non-productive cough,  No coughing up of blood.  No change in color of mucus.  No wheezing.  No chest wall deformity  Skin: no rash or lesions.  GU: no  dysuria, change in color of urine, no urgency or frequency.  No flank pain, no hematuria   MS:  No joint pain or swelling.  No decreased range of motion.  No back pain.    Physical Exam  BP 112/66 (BP Location: Left Arm, Cuff Size: Large)   Pulse 79   Temp (!) 97 F (36.1 C) (Temporal)   Ht 5' 11.5" (1.816 m)   Wt (!) 363 lb 9.6 oz (164.9 kg)   SpO2 95% Comment: RA  BMI 50.01 kg/m   GEN: A/Ox3; pleasant , NAD, BMI 50    HEENT:  [place]/AT,    NOSE-clear, THROAT-clear, no lesions, no postnasal drip or exudate noted.  Class 2-3 MP airway   NECK:  Supple w/ fair ROM; no JVD; normal carotid impulses w/o bruits; no thyromegaly or nodules palpated; no lymphadenopathy.    RESP  Clear  P & A; w/o, wheezes/ rales/ or rhonchi. no accessory muscle use, no dullness to percussion  CARD:  RRR, no m/r/g, tr  peripheral edema, pulses intact, no cyanosis or clubbing.  GI:   Soft & nt; nml bowel sounds; no organomegaly or masses detected.   Musco: Warm bil, no deformities or joint swelling noted.   Neuro: alert, no focal deficits noted.    Skin: Warm, no lesions or rashes    Lab Results:  BMET  BNP  Imaging: No results found.    PFT Results Latest Ref Rng & Units 04/15/2017  FVC-Pre L 3.06  FVC-Predicted Pre % 67  FVC-Post L 3.00  FVC-Predicted Post % 66  Pre FEV1/FVC % % 63  Post FEV1/FCV % % 67  FEV1-Pre L 1.94  FEV1-Predicted Pre % 58  FEV1-Post L 2.02  DLCO UNC% % 55  DLCO COR %Predicted % 73  TLC L 6.18  TLC % Predicted % 85  RV % Predicted % 96    No results found for: NITRICOXIDE      Assessment & Plan:   Obstructive sleep apnea Excellent control and compliance on nocturnal BiPAP.  No changes.  Plan  Patient Instructions  Keep up good work  Continue on BIPAP At bedtime   Work on healthy weight  Do not drive if sleepy .  Activity as tolerated.  Follow up with Dr. Hermina Staggers in 6 months and As needed       Morbid obesity due to excess calories  ([place]) Healthy weight loss discussed   Total patient care 25 minutes  Rexene Edison, NP 02/19/2019

## 2019-02-19 NOTE — Assessment & Plan Note (Signed)
Healthy weight loss discussed 

## 2019-02-19 NOTE — Patient Instructions (Signed)
Keep up good work  Continue on BIPAP At bedtime   Work on healthy weight  Do not drive if sleepy .  Activity as tolerated.  Follow up with Dr. Hermina Staggers in 6 months and As needed

## 2019-02-19 NOTE — Assessment & Plan Note (Signed)
Excellent control and compliance on nocturnal BiPAP.  No changes.  Plan  Patient Instructions  Keep up good work  Continue on BIPAP At bedtime   Work on healthy weight  Do not drive if sleepy .  Activity as tolerated.  Follow up with Dr. Hermina Staggers in 6 months and As needed

## 2019-03-10 ENCOUNTER — Other Ambulatory Visit: Payer: Self-pay | Admitting: Internal Medicine

## 2019-03-12 DIAGNOSIS — G4733 Obstructive sleep apnea (adult) (pediatric): Secondary | ICD-10-CM | POA: Diagnosis not present

## 2019-03-19 ENCOUNTER — Other Ambulatory Visit: Payer: Self-pay | Admitting: *Deleted

## 2019-03-19 MED ORDER — TELMISARTAN 40 MG PO TABS: ORAL_TABLET | ORAL | 11 refills | Status: DC

## 2019-03-23 ENCOUNTER — Ambulatory Visit: Payer: Medicare PPO | Attending: Internal Medicine

## 2019-03-23 DIAGNOSIS — Z23 Encounter for immunization: Secondary | ICD-10-CM | POA: Insufficient documentation

## 2019-04-07 ENCOUNTER — Other Ambulatory Visit: Payer: Self-pay

## 2019-04-07 MED ORDER — OMEPRAZOLE 40 MG PO CPDR: DELAYED_RELEASE_CAPSULE | ORAL | 0 refills | Status: DC

## 2019-04-08 ENCOUNTER — Other Ambulatory Visit: Payer: Self-pay | Admitting: Emergency Medicine

## 2019-04-08 MED ORDER — SERTRALINE HCL 50 MG PO TABS: ORAL_TABLET | ORAL | 0 refills | Status: DC

## 2019-04-08 MED ORDER — MELOXICAM 15 MG PO TABS: ORAL_TABLET | ORAL | 0 refills | Status: DC

## 2019-04-08 MED ORDER — TRIAMTERENE-HCTZ 75-50 MG PO TABS: 0.5000 | ORAL_TABLET | Freq: Every morning | ORAL | 0 refills | Status: DC

## 2019-04-09 ENCOUNTER — Ambulatory Visit: Payer: Medicare PPO

## 2019-04-14 ENCOUNTER — Telehealth: Payer: Self-pay | Admitting: Family Medicine

## 2019-04-14 NOTE — Telephone Encounter (Signed)
Pt has a VV  scheduled with Dr. Birdie Riddle tomorrow will add med f/up on the appt notes.

## 2019-04-14 NOTE — Telephone Encounter (Signed)
Pt called in asking for a refill on the atorvastatin and potassium chloride, pt uses [facility] in [place]

## 2019-04-14 NOTE — Telephone Encounter (Signed)
Pt has not been seen by PCP since 01/23/2018. Cannot fill without an appt.

## 2019-04-15 ENCOUNTER — Ambulatory Visit (INDEPENDENT_AMBULATORY_CARE_PROVIDER_SITE_OTHER): Payer: Medicare PPO | Admitting: Family Medicine

## 2019-04-15 ENCOUNTER — Encounter: Payer: Self-pay | Admitting: Family Medicine

## 2019-04-15 ENCOUNTER — Other Ambulatory Visit: Payer: Self-pay

## 2019-04-15 VITALS — BP 100/52 | Ht 71.5 in | Wt 351.0 lb

## 2019-04-15 DIAGNOSIS — L4050 Arthropathic psoriasis, unspecified: Secondary | ICD-10-CM

## 2019-04-15 DIAGNOSIS — G8929 Other chronic pain: Secondary | ICD-10-CM | POA: Diagnosis not present

## 2019-04-15 DIAGNOSIS — I509 Heart failure, unspecified: Secondary | ICD-10-CM | POA: Diagnosis not present

## 2019-04-15 DIAGNOSIS — I10 Essential (primary) hypertension: Secondary | ICD-10-CM | POA: Diagnosis not present

## 2019-04-15 DIAGNOSIS — E119 Type 2 diabetes mellitus without complications: Secondary | ICD-10-CM | POA: Diagnosis not present

## 2019-04-15 DIAGNOSIS — M25511 Pain in right shoulder: Secondary | ICD-10-CM | POA: Diagnosis not present

## 2019-04-15 DIAGNOSIS — E785 Hyperlipidemia, unspecified: Secondary | ICD-10-CM

## 2019-04-15 MED ORDER — POTASSIUM CHLORIDE ER 10 MEQ PO CPCR: ORAL_CAPSULE | ORAL | 0 refills | Status: DC

## 2019-04-15 MED ORDER — ATORVASTATIN CALCIUM 40 MG PO TABS: ORAL_TABLET | ORAL | 0 refills | Status: DC

## 2019-04-15 NOTE — Assessment & Plan Note (Signed)
Chronic problem, on Methotrexate, following w/ Rheum.

## 2019-04-15 NOTE — Progress Notes (Signed)
I have discussed the procedure for the virtual visit with the patient who has given consent to proceed with assessment and treatment.   Jessica L Brodmerkel, CMA     

## 2019-04-15 NOTE — Progress Notes (Signed)
Virtual Visit via Video   I connected with patient on 04/15/19 at 10:00 AM EST by a video enabled telemedicine application and verified that I am speaking with the correct person using two identifiers.  Location patient: Home Location provider: Acup[facility]urist, Office Persons participating in the virtual visit: Patient, Provider, [place] (Jess B)  I discussed the limitations of evaluation and management by telemedicine and the availability of in person appointments. The patient expressed understanding and agreed to proceed.  Subjective:   HPI:   HTN- chronic problem, on Bisoprolol 5mg , Telmisartan 40mg  daily, Triamterene HCTZ 75/50mg  1/2 tab daily.  BP is somewhat low today.  Pt reports feeling well.  'Typically 120/70'.  No dizziness, CP, SOB above baseline.  Hyperlipidemia- chronic problem, on Lipitor 40mg  daily.  No abd pain, N/V.    DM- chronic problem, previously on Actos but has been out of medication for some time.  UTD on foot exam, eye exam.  On ARB for renal protection.  Pt reports checking CBGs once weekly 'and they've been pretty good'.  + tingling of feet, none in hands.  CHF- chronic problem, following w/ Cards.  On beta blocker, ASA, ARB, lasix.  Continues to have swelling of feet and lower legs- improves w/ compression socks.  Psoriatic Arthritis- following w/ Rheum.  Having R shoulder pain- 'it's gotten progressively worse'.  TTP.  Dr Amil Amen is aware of the pain.  Currently on experimental drug.  Pt is R hand dominant.  Having weakness when attempting to lift things.  Has been seen at [facility] previously.  ROS:   See pertinent positives and negatives per HPI.  Patient Active Problem List   Diagnosis Date Noted  . Dyspnea on exertion 02/19/2017  . Bilateral lower extremity edema 07/06/2015  . CHF (congestive heart failure) ([place]) 06/27/2015  . Fungal dermatitis 06/27/2015  . Edema 06/20/2015  . Increasing shortness of breath 06/20/2015  . Lumbar  radiculopathy, acute 03/09/2015  . Post-viral cough syndrome 02/15/2015  . Essential hypertension 12/27/2014  . Type 2 diabetes mellitus without complication, without long-term current use of insulin ([place]) 12/27/2014  . Non-suppurative otitis media 06/28/2014  . Mild aortic stenosis 06/28/2014  . Memory loss 04/29/2014  . Tinnitus of both ears 02/17/2014  . Wellness examination 12/04/2013  . Constipation due to slow transit 08/18/2013  . Morbid obesity due to excess calories ([place]) 06/23/2013  . S/P left TKA 06/22/2013  . Meniere disease 04/21/2013  . Benign paroxysmal positional vertigo 04/21/2013  . Psoriatic arthritis ([place]) 05/14/2012  . General medical exam 01/28/2012  . Cellulitis of eyelid 09/25/2011  . Diverticulitis 06/20/2011  . Erythrocytosis 05/18/2011  . Sinusitis, acute 03/14/2011  . S/P TKR (total knee replacement) 12/31/2010  . Obesity 12/31/2010  . Hyperlipidemia 12/22/2010  . BPH (benign prostatic hyperplasia) 12/22/2010  . Gout 12/22/2010  . Obstructive sleep apnea 04/04/2009  . HYPERTENSION 04/04/2009    Social History   Tobacco Use  . Smoking status: Never Smoker  . Smokeless tobacco: Never Used  Substance Use Topics  . Alcohol use: Yes    Alcohol/week: 0.0 standard drinks    Comment: Once a month    Current Outpatient Medications:  .  Albuterol Sulfate (PROAIR RESPICLICK) 123XX123 (90 Base) MCG/ACT AEPB, Inhale 2 puffs into the lungs every 6 (six) hours as needed (for wheezing or shortness of breath)., Disp: 1 each, Rfl: 3 .  allopurinol (ZYLOPRIM) 300 MG tablet, TAKE 1 TABLET(300 MG) BY MOUTH DAILY, Disp: 30 tablet, Rfl: 0 .  aspirin 81 MG tablet, Take 81 mg by mouth daily., Disp: , Rfl:  .  atorvastatin (LIPITOR) 40 MG tablet, TAKE 1 TABLET(40 MG) BY MOUTH EVERY EVENING, Disp: 90 tablet, Rfl: 0 .  bisoprolol (ZEBETA) 5 MG tablet, TAKE 1 TABLET(5 MG) BY MOUTH DAILY, Disp: 30 tablet, Rfl: 11 .  cetirizine (ZYRTEC) 10 MG tablet, TAKE 1 TABLET(10 MG) BY MOUTH  DAILY, Disp: 30 tablet, Rfl: 0 .  chlorpheniramine-HYDROcodone (TUSSIONEX PENNKINETIC ER) 10-8 MG/5ML SUER, Take 5 mLs by mouth every 12 (twelve) hours as needed., Disp: 70 mL, Rfl: 0 .  Cholecalciferol (VITAMIN D) 2000 UNITS tablet, Take 2,000 Units by mouth daily., Disp: , Rfl:  .  Cyanocobalamin (B-12) 2000 MCG TABS, Take by mouth., Disp: , Rfl:  .  donepezil (ARICEPT) 5 MG tablet, Take 1/2 tablet daily for 1 month, then increase to 1 tablet daily, Disp: 30 tablet, Rfl: 11 .  etodolac (LODINE) 400 MG tablet, Take by mouth., Disp: , Rfl:  .  fluticasone (FLONASE) 50 MCG/ACT nasal spray, Place 2 sprays into both nostrils daily., Disp: 16 g, Rfl: 6 .  folic acid (FOLVITE) 1 MG tablet, Take 1 mg by mouth daily. , Disp: , Rfl:  .  furosemide (LASIX) 20 MG tablet, TAKE 1 TABLET(20 MG) BY MOUTH DAILY, Disp: 90 tablet, Rfl: 3 .  meloxicam (MOBIC) 15 MG tablet, Take 1 tablet by mouth daily. Please call the office to schedule an appointment, Disp: 90 tablet, Rfl: 0 .  methotrexate (RHEUMATREX) 2.5 MG tablet, Take 15 mg by mouth once a week. , Disp: , Rfl:  .  MYRBETRIQ 25 MG TB24 tablet, , Disp: , Rfl:  .  nystatin ointment (MYCOSTATIN), Apply 1 application topically 2 (two) times daily. (Patient taking differently: Apply 1 application topically 2 (two) times daily as needed. ), Disp: 90 g, Rfl: 1 .  omeprazole (PRILOSEC) 40 MG capsule, TAKE 1 CAPSULE 30-60 MINUTES BEFORE YOUR FIRST AND LAST MEAL OF THE DAY, Disp: 180 capsule, Rfl: 0 .  pioglitazone (ACTOS) 30 MG tablet, Take 1 tablet (30 mg total) by mouth daily. You must schedule an appt for additional refills, Disp: 30 tablet, Rfl: 0 .  potassium chloride (MICRO-K) 10 MEQ CR capsule, TAKE 1 CAPSULE BY MOUTH EVERY DAY, Disp: 30 capsule, Rfl: 0 .  sertraline (ZOLOFT) 50 MG tablet, TAKE 1 TABLET(50 MG) BY MOUTH DAILY. Please call the office to schedule an appointment, Disp: 30 tablet, Rfl: 0 .  telmisartan (MICARDIS) 40 MG tablet, TAKE 1 TABLET(40 MG) BY  MOUTH DAILY, Disp: 30 tablet, Rfl: 11 .  tiZANidine (ZANAFLEX) 4 MG tablet, Take 1 tablet (4 mg total) by mouth every 6 (six) hours as needed for muscle spasms., Disp: 30 tablet, Rfl: 0 .  triamterene-hydrochlorothiazide (MAXZIDE) 75-50 MG tablet, Take 0.5 tablets by mouth every morning. Please call the office to schedule an appointment, Disp: 45 tablet, Rfl: 0 .  UNABLE TO FIND, Med Name: BiPap with [facility], Disp: , Rfl:   Allergies  Allergen Reactions  . Lyrica [Pregabalin] Swelling  . Penicillins Swelling  . Levofloxacin Rash    Objective:   BP (!) 100/52 Comment: in an experimental drug program  Ht 5' 11.5" (1.816 m)   Wt (!) 351 lb (159.2 kg)   BMI 48.27 kg/m   AAOx3, NAD NCAT, EOMI No obvious CN deficits Coloring WNL Pt is able to speak clearly, coherently without shortness of breath or increased work of breathing.  Thought process is linear.  Mood is appropriate.  Assessment and Plan:   DM- chronic problem.  Has been off medications for a few months and reports sugars are 'good'.  UTD on foot exam, eye exam, and on ARB.  Will check labs and if medication needs to be restarted, it would be a good time to switch away from Actos given his CHF.  Will follow closely.  HTN- chronic problem.  BP is low today.  Per pt report typically runs 120s/70s.  Currently asymtomatic.  Encouraged him to let me know if he becomes dizzy or symptomatic b/c we can adjust meds prn.  Hyperlipidemia- chronic problem.  Tolerating statin w/o difficulty.  Check labs.  Adjust meds prn    CHF- chronic problem, following w/ Cards.  Currently asymptomatic w/ exception of usual LE edema.  Will follow along  Psoriatic Arthritis- chronic problem, following w/ Dr Amil Amen.  Currently on Methotrexate and 'study medication'  Shoulder pain- worsening.  Pt has known psoriatic arthritis and is on tx per Rheumatology but given his limitations, will refer to Ortho for evaluation and tx   Annye Asa,  MD 04/15/2019

## 2019-04-16 ENCOUNTER — Ambulatory Visit: Payer: Medicare PPO | Attending: Internal Medicine

## 2019-04-16 DIAGNOSIS — Z23 Encounter for immunization: Secondary | ICD-10-CM | POA: Insufficient documentation

## 2019-04-16 NOTE — Progress Notes (Signed)
   Covid-19 Vaccination Clinic  Name:  [name]    MRN: [id number] DOB: 1946/01/29  04/16/2019  [name] was observed post Covid-19 immunization for 15 minutes without incident. He was provided with Vaccine Information Sheet and instruction to access the V-Safe system.   [name] was instructed to call 911 with any severe reactions post vaccine: Marland Kitchen Difficulty breathing  . Swelling of face and throat  . A fast heartbeat  . A bad rash all over body  . Dizziness and weakness   Immunizations Administered    Name Date Dose VIS Date Route   Pfizer COVID-19 Vaccine 04/16/2019  6:38 PM 0.3 mL 01/23/2019 Intramuscular   Manufacturer: [place]   Lot: [id number]   [place]: [id number]

## 2019-04-17 ENCOUNTER — Ambulatory Visit: Payer: Medicare PPO

## 2019-04-17 ENCOUNTER — Other Ambulatory Visit: Payer: Self-pay | Admitting: General Practice

## 2019-04-17 MED ORDER — SERTRALINE HCL 50 MG PO TABS: ORAL_TABLET | ORAL | 1 refills | Status: DC

## 2019-04-17 MED ORDER — PIOGLITAZONE HCL 30 MG PO TABS: 30.0000 mg | ORAL_TABLET | Freq: Every day | ORAL | 1 refills | Status: DC

## 2019-04-21 ENCOUNTER — Other Ambulatory Visit: Payer: Self-pay

## 2019-04-21 ENCOUNTER — Ambulatory Visit (INDEPENDENT_AMBULATORY_CARE_PROVIDER_SITE_OTHER): Payer: Medicare PPO

## 2019-04-21 DIAGNOSIS — I10 Essential (primary) hypertension: Secondary | ICD-10-CM

## 2019-04-21 DIAGNOSIS — E119 Type 2 diabetes mellitus without complications: Secondary | ICD-10-CM | POA: Diagnosis not present

## 2019-04-21 DIAGNOSIS — E785 Hyperlipidemia, unspecified: Secondary | ICD-10-CM | POA: Diagnosis not present

## 2019-04-21 DIAGNOSIS — M25511 Pain in right shoulder: Secondary | ICD-10-CM | POA: Diagnosis not present

## 2019-04-21 LAB — HEPATIC FUNCTION PANEL
ALT: 34 U/L (ref 0–53)
AST: 28 U/L (ref 0–37)
Albumin: 3.7 g/dL (ref 3.5–5.2)
Alkaline Phosphatase: 108 U/L (ref 39–117)
Bilirubin, Direct: 0.4 mg/dL — ABNORMAL HIGH (ref 0.0–0.3)
Total Bilirubin: 1.5 mg/dL — ABNORMAL HIGH (ref 0.2–1.2)
Total Protein: 5.9 g/dL — ABNORMAL LOW (ref 6.0–8.3)

## 2019-04-21 LAB — COMPREHENSIVE METABOLIC PANEL
ALT: 34 U/L (ref 0–53)
AST: 28 U/L (ref 0–37)
Albumin: 3.7 g/dL (ref 3.5–5.2)
Alkaline Phosphatase: 108 U/L (ref 39–117)
BUN: 19 mg/dL (ref 6–23)
CO2: 31 mEq/L (ref 19–32)
Calcium: 9.2 mg/dL (ref 8.4–10.5)
Chloride: 96 mEq/L (ref 96–112)
Creatinine, Ser: 1.46 mg/dL (ref 0.40–1.50)
GFR: 47.14 mL/min — ABNORMAL LOW (ref >60.00)
Glucose, Bld: 249 mg/dL — ABNORMAL HIGH (ref 70–99)
Potassium: 3.7 mEq/L (ref 3.5–5.1)
Sodium: 137 mEq/L (ref 135–145)
Total Bilirubin: 1.5 mg/dL — ABNORMAL HIGH (ref 0.2–1.2)
Total Protein: 5.9 g/dL — ABNORMAL LOW (ref 6.0–8.3)

## 2019-04-21 LAB — CBC WITH DIFFERENTIAL/PLATELET
Basophils Absolute: 0.1 10*3/uL (ref 0.0–0.1)
Basophils Relative: 1.3 % (ref 0.0–3.0)
Eosinophils Absolute: 0.5 10*3/uL (ref 0.0–0.7)
Eosinophils Relative: 8.1 % — ABNORMAL HIGH (ref 0.0–5.0)
HCT: 44.8 % (ref 39.0–52.0)
Hemoglobin: 15.3 g/dL (ref 13.0–17.0)
Lymphocytes Relative: 17.2 % (ref 12.0–46.0)
Lymphs Abs: 1 10*3/uL (ref 0.7–4.0)
MCHC: 34.1 g/dL (ref 30.0–36.0)
MCV: 96.8 fl (ref 78.0–100.0)
Monocytes Absolute: 0.5 10*3/uL (ref 0.1–1.0)
Monocytes Relative: 8.5 % (ref 3.0–12.0)
Neutro Abs: 3.8 10*3/uL (ref 1.4–7.7)
Neutrophils Relative %: 64.9 % (ref 43.0–77.0)
Platelets: 196 10*3/uL (ref 150.0–400.0)
RBC: 4.63 Mil/uL (ref 4.22–5.81)
RDW: 16.8 % — ABNORMAL HIGH (ref 11.5–15.5)
WBC: 5.9 10*3/uL (ref 4.0–10.5)

## 2019-04-21 LAB — LIPID PANEL
Cholesterol: 97 mg/dL (ref 0–200)
HDL: 33.2 mg/dL — ABNORMAL LOW (ref >39.00)
LDL Cholesterol: 39 mg/dL (ref 0–99)
NonHDL: 63.59
Total CHOL/HDL Ratio: 3
Triglycerides: 125 mg/dL (ref 0.0–149.0)
VLDL: 25 mg/dL (ref 0.0–40.0)

## 2019-04-21 LAB — HEMOGLOBIN A1C: Hgb A1c MFr Bld: 9 % — ABNORMAL HIGH (ref 4.6–6.5)

## 2019-04-22 ENCOUNTER — Other Ambulatory Visit: Payer: Self-pay | Admitting: General Practice

## 2019-04-22 MED ORDER — JARDIANCE 10 MG PO TABS: 10.0000 mg | ORAL_TABLET | Freq: Every day | ORAL | 6 refills | Status: DC

## 2019-04-22 NOTE — Progress Notes (Signed)
Called pt and lmovm to return call.  Medication filled to local pharmacy and Actos removed from pt med list.

## 2019-04-23 ENCOUNTER — Encounter: Payer: Self-pay | Admitting: General Practice

## 2019-05-04 ENCOUNTER — Ambulatory Visit: Payer: Medicare Other | Admitting: Podiatry

## 2019-06-01 ENCOUNTER — Encounter: Payer: Self-pay | Admitting: Podiatry

## 2019-06-01 ENCOUNTER — Other Ambulatory Visit: Payer: Self-pay

## 2019-06-01 ENCOUNTER — Ambulatory Visit: Payer: Medicare PPO | Admitting: Podiatry

## 2019-06-01 DIAGNOSIS — B351 Tinea unguium: Secondary | ICD-10-CM | POA: Diagnosis not present

## 2019-06-01 DIAGNOSIS — M79674 Pain in right toe(s): Secondary | ICD-10-CM | POA: Diagnosis not present

## 2019-06-01 DIAGNOSIS — M79675 Pain in left toe(s): Secondary | ICD-10-CM | POA: Diagnosis not present

## 2019-06-01 NOTE — Patient Instructions (Signed)
Diabetes Mellitus and Foot Care Foot care is an important part of your health, especially when you have diabetes. Diabetes may cause you to have problems because of poor blood flow (circulation) to your feet and legs, which can cause your skin to:  Become thinner and drier.  Break more easily.  Heal more slowly.  Peel and crack. You may also have nerve damage (neuropathy) in your legs and feet, causing decreased feeling in them. This means that you may not notice minor injuries to your feet that could lead to more serious problems. Noticing and addressing any potential problems early is the best way to prevent future foot problems. How to care for your feet Foot hygiene  Wash your feet daily with warm water and mild soap. Do not use hot water. Then, pat your feet and the areas between your toes until they are completely dry. Do not soak your feet as this can dry your skin.  Trim your toenails straight across. Do not dig under them or around the cuticle. File the edges of your nails with an emery board or nail file.  Apply a moisturizing lotion or petroleum jelly to the skin on your feet and to dry, brittle toenails. Use lotion that does not contain alcohol and is unscented. Do not apply lotion between your toes. Shoes and socks  Wear clean socks or stockings every day. Make sure they are not too tight. Do not wear knee-high stockings since they may decrease blood flow to your legs.  Wear shoes that fit properly and have enough cushioning. Always look in your shoes before you put them on to be sure there are no objects inside.  To break in new shoes, wear them for just a few hours a day. This prevents injuries on your feet. Wounds, scrapes, corns, and calluses  Check your feet daily for blisters, cuts, bruises, sores, and redness. If you cannot see the bottom of your feet, use a mirror or ask someone for help.  Do not cut corns or calluses or try to remove them with medicine.  If you  find a minor scrape, cut, or break in the skin on your feet, keep it and the skin around it clean and dry. You may clean these areas with mild soap and water. Do not clean the area with peroxide, alcohol, or iodine.  If you have a wound, scrape, corn, or callus on your foot, look at it several times a day to make sure it is healing and not infected. Check for: ? Redness, swelling, or pain. ? Fluid or blood. ? Warmth. ? Pus or a bad smell. General instructions  Do not cross your legs. This may decrease blood flow to your feet.  Do not use heating pads or hot water bottles on your feet. They may burn your skin. If you have lost feeling in your feet or legs, you may not know this is happening until it is too late.  Protect your feet from hot and cold by wearing shoes, such as at the beach or on hot pavement.  Schedule a complete foot exam at least once a year (annually) or more often if you have foot problems. If you have foot problems, report any cuts, sores, or bruises to your health care provider immediately. Contact a health care provider if:  You have a medical condition that increases your risk of infection and you have any cuts, sores, or bruises on your feet.  You have an injury that is not   healing.  You have redness on your legs or feet.  You feel burning or tingling in your legs or feet.  You have pain or cramps in your legs and feet.  Your legs or feet are numb.  Your feet always feel cold.  You have pain around a toenail. Get help right away if:  You have a wound, scrape, corn, or callus on your foot and: ? You have pain, swelling, or redness that gets worse. ? You have fluid or blood coming from the wound, scrape, corn, or callus. ? Your wound, scrape, corn, or callus feels warm to the touch. ? You have pus or a bad smell coming from the wound, scrape, corn, or callus. ? You have a fever. ? You have a red line going up your leg. Summary  Check your feet every day  for cuts, sores, red spots, swelling, and blisters.  Moisturize feet and legs daily.  Wear shoes that fit properly and have enough cushioning.  If you have foot problems, report any cuts, sores, or bruises to your health care provider immediately.  Schedule a complete foot exam at least once a year (annually) or more often if you have foot problems. This information is not intended to replace advice given to you by your health care provider. Make sure you discuss any questions you have with your health care provider. Document Revised: 10/22/2018 Document Reviewed: 03/02/2016 Elsevier Patient Education  2020 [employer]  

## 2019-06-04 NOTE — Progress Notes (Signed)
Subjective: Gary Butler presents today for follow up of preventative diabetic foot care and painful mycotic nails b/l that are difficult to trim. Pain interferes with ambulation. Aggravating factors include wearing enclosed shoe gear. Pain is relieved with periodic professional debridement.   Allergies  Allergen Reactions  . Lyrica [Pregabalin] Swelling  . Penicillins Swelling  . Levofloxacin Rash     Objective: There were no vitals filed for this visit.  Pt 74 y.o. year old male  in NAD. AAO x 3.   Vascular Examination:  Capillary refill time to digits immediate b/l. Palpable DP pulses b/l. Palpable PT pulses b/l. Pedal hair present b/l. Skin temperature gradient within normal limits b/l.  Dermatological Examination: Pedal skin with normal turgor, texture and tone bilaterally. No open wounds bilaterally. Toenails 1-5 b/l elongated, dystrophic, thickened, crumbly with subungual debris and tenderness to dorsal palpation.  Musculoskeletal: Normal muscle strength 5/5 to all lower extremity muscle groups bilaterally, no gross bony deformities bilaterally, no pain crepitus or joint limitation noted with ROM b/l and patient ambulates independent of any assistive aids  Neurological: Protective sensation intact 5/5 intact bilaterally with 10g monofilament b/l. Babinski reflex negative b/l. Clonus negative b/l.  Assessment: 1. Pain due to onychomycosis of toenails of both feet    Plan: -Continue diabetic foot care principles. Literature dispensed on today.  -Toenails 1-5 b/l were debrided in length and girth with sterile nail nippers and dremel without iatrogenic bleeding.  -Patient to continue soft, supportive shoe gear daily. -Patient to report any pedal injuries to medical professional immediately. -Patient/POA to call should there be question/concern in the interim.  Return in about 3 months (around 08/31/2019) for diabetic nail trim.

## 2019-06-08 ENCOUNTER — Other Ambulatory Visit: Payer: Self-pay | Admitting: Internal Medicine

## 2019-06-08 MED ORDER — BISOPROLOL FUMARATE 5 MG PO TABS: ORAL_TABLET | ORAL | 0 refills | Status: DC

## 2019-06-24 ENCOUNTER — Ambulatory Visit (INDEPENDENT_AMBULATORY_CARE_PROVIDER_SITE_OTHER): Payer: Medicare PPO

## 2019-06-24 ENCOUNTER — Telehealth: Payer: Self-pay

## 2019-06-24 ENCOUNTER — Other Ambulatory Visit: Payer: Self-pay

## 2019-06-24 DIAGNOSIS — I509 Heart failure, unspecified: Secondary | ICD-10-CM

## 2019-06-24 DIAGNOSIS — H8109 Meniere's disease, unspecified ear: Secondary | ICD-10-CM

## 2019-06-24 DIAGNOSIS — R0602 Shortness of breath: Secondary | ICD-10-CM

## 2019-06-24 DIAGNOSIS — Z0000 Encounter for general adult medical examination without abnormal findings: Secondary | ICD-10-CM

## 2019-06-24 DIAGNOSIS — G4733 Obstructive sleep apnea (adult) (pediatric): Secondary | ICD-10-CM

## 2019-06-24 NOTE — Addendum Note (Signed)
Addended by: Davis Gourd on: 06/24/2019 04:56 PM   Modules accepted: Orders

## 2019-06-24 NOTE — Progress Notes (Addendum)
This visit is being conducted via phone call due to the COVID-19 pandemic. This patient has given me verbal consent via phone to conduct this visit, patient states they are participating from their home address. Some vital signs may be absent or patient reported.   Patient identification: identified by name, DOB, and current address.  Location provider: [facility] Persons participating in the virtual visit: Denman George LPN, patient, and Dr. Annye Asa    Subjective:   Gary Butler is a 74 y.o. male who presents for Medicare Annual/Subsequent preventive examination.  Review of Systems:   Cardiac Risk Factors include: advanced age (>55men, >19 women);diabetes mellitus;dyslipidemia;hypertension;male gender;sedentary lifestyle;obesity (BMI >30kg/m2)    Objective:    Vitals: There were no vitals taken for this visit.  There is no height or weight on file to calculate BMI.  Advanced Directives 06/24/2019 01/23/2018 11/14/2017 03/15/2015 07/19/2014 06/22/2013 06/17/2013  Does Patient Have a Medical Advance Directive? Yes Yes Yes Yes Yes Patient has advance directive, copy not in chart Patient has advance directive, copy not in chart  Type of Advance Directive Rafael Capo;Living will [place];Living will [place];Out of facility DNR (pink MOST or yellow form);Living will [place];Living will [place];Living will Living will;Healthcare Power of Attorney Living will;Healthcare Power of Attorney  Does patient want to make changes to medical advance directive? No - Patient declined - No - Patient declined - - - -  Copy of [place] in Chart? No - copy requested No - copy requested No - copy requested - Yes Copy requested from family -    Tobacco Social History   Tobacco Use  Smoking Status Never Smoker  Smokeless Tobacco Never Used     Counseling given: Not  Answered   Clinical Intake:  Pre-visit preparation completed: Yes  Pain : No/denies pain  Diabetes: Yes(patient states only testing weekly at this time) CBG done?: No Did pt. bring in CBG monitor from home?: No  How often do you need to have someone help you when you read instructions, pamphlets, or other written materials from your doctor or pharmacy?: 2 - Rarely  Interpreter Needed?: No  Information entered by :: Denman George LPN  Past Medical History:  Diagnosis Date  . Arthritis   . Constipation   . Diabetes mellitus   . Heart murmur   . History of gout   . History of skin cancer   . Hypertension   . OSA (obstructive sleep apnea)   . Psoriatic arthritis ([place])   . Rectal fissure   . Sleep apnea   . Tinnitus    Past Surgical History:  Procedure Laterality Date  . addenoids    . JOINT REPLACEMENT  2012   RT KNEE  . LUNG BIOPSY     20 YRS AGO  . TONSILLECTOMY    . 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 Emphysema Father   . Clotting disorder Father   . Arthritis Father   . Heart disease Mother   . Arthritis Mother   . Arthritis Maternal Grandmother   . Hypertension Maternal Grandmother   . Diabetes Maternal Grandmother   . Arthritis Paternal Grandmother   . Diabetes  Maternal Aunt   . Diabetes Paternal Aunt    Social History   Socioeconomic History  . Marital status: Married    Spouse name: Not on file  . Number of children: 2  . Years of education: Not on file  . Highest education level: Not on file  Occupational History  . 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 Smoking status: Never Smoker  . Smokeless tobacco: Never Used   Substance and Sexual Activity  . Alcohol use: Yes    Alcohol/week: 0.0 standard drinks    Comment: Once a month  . Drug use: No  . Sexual activity: Never  Other Topics Concern  . Not on file  Social History Narrative   Alcohol- 1 to 2 per month.    Social Determinants of Health   Financial Resource Strain:   . Difficulty of Paying Living Expenses:   Food Insecurity:   . Worried About Charity fundraiser in the Last Year:   . Arboriculturist in the Last Year:   Transportation Needs:   . Film/video editor (Medical):   Marland Kitchen Lack of Transportation (Non-Medical):   Physical Activity:   . Days of Exercise per Week:   . Minutes of Exercise per Session:   Stress:   . Feeling of Stress :   Social Connections:   . Frequency of Communication with Friends and Family:   . Frequency of Social Gatherings with Friends and Family:   . Attends Religious Services:   . Active Member of Clubs or Organizations:   . Attends Archivist Meetings:   Marland Kitchen Marital Status:     Outpatient Encounter Medications as of 06/24/2019  Medication Sig  . Albuterol Sulfate (PROAIR RESPICLICK) 123XX123 (90 Base) MCG/ACT AEPB Inhale 2 puffs into the lungs every 6 (six) hours as needed (for wheezing or shortness of breath).  Marland Kitchen allopurinol (ZYLOPRIM) 300 MG tablet TAKE 1 TABLET(300 MG) BY MOUTH DAILY  . aspirin 81 MG tablet Take 81 mg by mouth daily.  Marland Kitchen atorvastatin (LIPITOR) 40 MG tablet TAKE 1 TABLET(40 MG) BY MOUTH EVERY EVENING  . bisoprolol (ZEBETA) 5 MG tablet TAKE 1 TABLET(5 MG) BY MOUTH DAILY  . cetirizine (ZYRTEC) 10 MG tablet TAKE 1 TABLET(10 MG) BY MOUTH DAILY  . chlorpheniramine-HYDROcodone (TUSSIONEX PENNKINETIC ER) 10-8 MG/5ML SUER Take 5 mLs by mouth every 12 (twelve) hours as needed.  . Cholecalciferol (VITAMIN D) 2000 UNITS tablet Take 2,000 Units by mouth daily.  . Cyanocobalamin (B-12) 2000 MCG TABS Take by mouth.  . donepezil (ARICEPT) 5 MG tablet Take 1/2 tablet daily for 1 month, then  increase to 1 tablet daily  . empagliflozin (JARDIANCE) 10 MG TABS tablet Take 10 mg by mouth daily before breakfast.  . etodolac (LODINE) 400 MG tablet Take by mouth.  . fluticasone (FLONASE) 50 MCG/ACT nasal spray Place 2 sprays into both nostrils daily.  . folic acid (FOLVITE) 1 MG tablet Take 1 mg by mouth daily.   . furosemide (LASIX) 20 MG tablet TAKE 1 TABLET(20 MG) BY MOUTH DAILY  . meloxicam (MOBIC) 15 MG tablet Take 1 tablet by mouth daily. Please call the office to schedule an appointment  . methotrexate (RHEUMATREX) 2.5 MG tablet Take 15 mg by mouth once a week.   Marland Kitchen MYRBETRIQ 25 MG TB24 tablet   . nystatin ointment (MYCOSTATIN) Apply 1 application topically 2 (two) times daily. (Patient taking differently:  Apply 1 application topically 2 (two) times daily as needed. )  . omeprazole (PRILOSEC) 40 MG capsule TAKE 1 CAPSULE 30-60 MINUTES BEFORE YOUR FIRST AND LAST MEAL OF THE DAY  . potassium chloride (MICRO-K) 10 MEQ CR capsule TAKE 1 CAPSULE BY MOUTH EVERY DAY  . sertraline (ZOLOFT) 50 MG tablet TAKE 1 TABLET(50 MG) BY MOUTH DAILY.  Marland Kitchen telmisartan (MICARDIS) 40 MG tablet TAKE 1 TABLET(40 MG) BY MOUTH DAILY  . tiZANidine (ZANAFLEX) 4 MG tablet Take 1 tablet (4 mg total) by mouth every 6 (six) hours as needed for muscle spasms.  Marland Kitchen triamterene-hydrochlorothiazide (MAXZIDE) 75-50 MG tablet Take 0.5 tablets by mouth every morning. Please call the office to schedule an appointment  . UNABLE TO FIND Med Name: BiPap with [facility]   No facility-administered encounter medications on file as of 06/24/2019.    Activities of Daily Living In your present state of health, do you have any difficulty performing the following activities: 06/24/2019 04/15/2019  Hearing? N N  Vision? N N  Difficulty concentrating or making decisions? Y N  Comment currently on Aricept and followed by neurology -  Walking or climbing stairs? Y N  Dressing or bathing? N N  Doing errands, shopping? Y N  Preparing Food and  eating ? N -  Using the Toilet? N -  In the past six months, have you accidently leaked urine? N -  Do you have problems with loss of bowel control? N -  Managing your Medications? N -  Managing your Finances? N -  Housekeeping or managing your Housekeeping? N -  Some recent data might be hidden    Patient Care Team: [name], MD as PCP - General (Family Medicine) [place], Jennefer Bravo, MD as Referring Physician (Dermatology) Sharyne Peach, MD as Consulting Physician (Ophthalmology) Hennie Duos, MD as Consulting Physician (Rheumatology) Kathie Rhodes, MD as Consulting Physician (Urology) Tanda Rockers, MD as Consulting Physician (Pulmonary Disease) Troy Sine, MD as Consulting Physician (Cardiology) Paralee Cancel, MD as Consulting Physician (Orthopedic Surgery) Cameron Sprang, MD as Consulting Physician (Neurology) Marzetta Board, DPM as Consulting Physician (Podiatry)   Assessment:   This is a routine wellness examination for Romaine.  Exercise Activities and Dietary recommendations Current Exercise Habits: The patient does not participate in regular exercise at present  Goals    . Weight (lb) < 330 lb (149.7 kg)     Lose weight by weight watchers lifestyle.        Fall Risk Fall Risk  06/24/2019 04/15/2019 04/15/2018 01/23/2018 10/09/2017  Falls in the past year? 0 1 0 0 No  Number falls in past yr: 0 0 0 - -  Injury with Fall? 0 0 0 - -  Risk for fall due to : Impaired balance/gait;Impaired mobility - - - -  Follow up Falls prevention discussed;Education provided;Falls evaluation completed Falls evaluation completed Falls evaluation completed - -   Is the patient's home free of loose throw rugs in walkways, pet beds, electrical cords, etc?   yes      Grab bars in the bathroom? yes      Handrails on the stairs?   yes      Adequate lighting?   yes   Depression Screen PHQ 2/9 Scores 06/24/2019 04/15/2019 01/23/2018 10/09/2017  PHQ - 2 Score 0 0 0 0   PHQ- 9 Score - 0 - -  Exception Documentation - - - -    Cognitive Function- patient followed by neurology and  currently on Aricept  MMSE - Mini Mental State Exam 01/23/2018  Orientation to time 5  Orientation to Place 5  Registration 3  Attention/ Calculation 5  Recall 1  Language- name 2 objects 2  Language- repeat 1  Language- follow 3 step command 3  Language- read & follow direction 1  Write a sentence 1  Copy design 1  Total score 28   Montreal Cognitive Assessment  05/28/2018 09/02/2015 12/27/2014  Visuospatial/ Executive (0/5) 0 5 5  Naming (0/3) 0 3 3  Attention: Read list of digits (0/2) 1 2 2   Attention: Read list of letters (0/1) 1 1 1   Attention: Serial 7 subtraction starting at 100 (0/3) 2 3 3   Language: Repeat phrase (0/2) 2 2 2   Language : Fluency (0/1) 1 1 0  Abstraction (0/2) 2 2 2   Delayed Recall (0/5) 1 3 4   Orientation (0/6) 6 6 6   Total 16 28 28   Adjusted Score (based on education) - 28 28   6CIT Screen 06/24/2019  What Year? 0 points  What month? 0 points  Count back from 20 0 points  Months in reverse 2 points  Repeat phrase 2 points    Immunization History  Administered Date(s) Administered  . Fluad Quad(high Dose 65+) 10/24/2018  . Influenza, High Dose Seasonal PF 12/09/2017  . Influenza,inj,Quad PF,6+ Mos 12/04/2013, 02/10/2015, 12/13/2015, 11/28/2016  . Influenza-Unspecified 11/22/2010, 12/14/2011, 11/11/2012  . PFIZER SARS-COV-2 Vaccination 03/23/2019, 04/16/2019  . Pneumococcal Conjugate-13 03/27/2013  . Pneumococcal Polysaccharide-23 10/17/2008, 02/20/2016  . Td 02/17/2014  . Zoster 11/23/2009  . Zoster Recombinat (Shingrix) 02/06/2018    Qualifies for Shingles Vaccine? Patient has had 1st in series on 02/09/18; is aware that 2nd dose is needed   Screening Tests Health Maintenance  Topic Date Due  . Hepatitis C Screening  04/14/2020 (Originally June 04, 1945)  . OPHTHALMOLOGY EXAM  08/21/2019  . INFLUENZA VACCINE  09/13/2019  .  HEMOGLOBIN A1C  10/22/2019  . FOOT EXAM  02/02/2020  . TETANUS/TDAP  02/18/2024  . COLONOSCOPY  08/07/2027  . COVID-19 Vaccine  Completed  . PNA vac Low Risk Adult  Completed   Cancer Screenings: Lung: Low Dose CT Chest recommended if Age 18-80 years, 30 pack-year currently smoking OR have quit w/in 15years. Patient does not qualify. Colorectal: colonoscopy 08/06/17      Plan:  I have personally reviewed and addressed the Medicare Annual Wellness questionnaire and have noted the following in the patient's chart:  A. Medical and social history B. Use of alcohol, tobacco or illicit drugs  C. Current medications and supplements D. Functional ability and status E.  Nutritional status F.  Physical activity G. Advance directives H. List of other physicians I.  Hospitalizations, surgeries, and ER visits in previous 12 months J.  [place] such as hearing and vision if needed, cognitive and depression L. Referrals, records requested, and appointments- none   In addition, I have reviewed and discussed with patient certain preventive protocols, quality metrics, and best practice recommendations. A written personalized care plan for preventive services as well as general preventive health recommendations were provided to patient.   Signed,  Denman George, LPN  Nurse Health Advisor   Nurse Notes: Patient is requesting referral to home health pt/ot for fall prevention.  Also discussed with patient diet changes with diabetes.

## 2019-06-24 NOTE — Telephone Encounter (Signed)
[place] for referral to home health PT for fall prevention/balance/strength training dx CHF, morbid obesity, shortness of breath, meniere's

## 2019-06-24 NOTE — Telephone Encounter (Signed)
Referral placed.

## 2019-06-24 NOTE — Progress Notes (Signed)
Patient called after appointment to report that he is no longer taking medication

## 2019-06-24 NOTE — Telephone Encounter (Signed)
Patient requesting home health order for fall prevention therapy.  Please advise.

## 2019-06-24 NOTE — Patient Instructions (Signed)
[name] , Thank you for taking time to come for your Medicare Wellness Visit. I appreciate your ongoing commitment to your health goals. Please review the following plan we discussed and let me know if I can assist you in the future.   Screening recommendations/referrals: Colorectal Screening: up to date; last 08/06/17  Vision and Dental Exams: Recommended annual ophthalmology exams for early detection of glaucoma and other disorders of the eye Recommended annual dental exams for proper oral hygiene  Diabetic Exams: Diabetic Eye Exam: recommended yearly; up to date  Diabetic Foot Exam: recommended yearly; up to date   Vaccinations: Influenza vaccine: up to date; last 10/24/18 Pneumococcal vaccine: up to date; last 02/20/16 Tdap vaccine: up to date; last 02/17/14 Shingles vaccine:  You may receive this vaccine at your local pharmacy. (see handout) you can still have 2nd vaccine after the 6 month period  Covid vaccine: completed   Advanced directives: Please bring a copy of your POA (Power of [place]) and/or Living Will to your next appointment.  Goals: Recommend to drink at least 6-8 8oz glasses of water per day and consume a balanced diet rich in fresh fruits and vegetables. (see information on diabetic diet tips)   We will refer you for home health for fall prevention program as requested   Next appointment: Please schedule your Annual Wellness Visit with your Nurse Health Advisor in one year.  Preventive Care 20 Years and Older, Male Preventive care refers to lifestyle choices and visits with your health care provider that can promote health and wellness. What does preventive care include?  A yearly physical exam. This is also called an annual well check.  Dental exams once or twice a year.  Routine eye exams. Ask your health care provider how often you should have your eyes checked.  Personal lifestyle choices, including:  Daily care of your teeth and gums.  Regular physical  activity.  Eating a healthy diet.  Avoiding tobacco and drug use.  Limiting alcohol use.  Practicing safe sex.  Taking low doses of aspirin every day if recommended by your health care provider..  Taking vitamin and mineral supplements as recommended by your health care provider. What happens during an annual well check? The services and screenings done by your health care provider during your annual well check will depend on your age, overall health, lifestyle risk factors, and family history of disease. Counseling  Your health care provider may ask you questions about your:  Alcohol use.  Tobacco use.  Drug use.  Emotional well-being.  Home and relationship well-being.  Sexual activity.  Eating habits.  History of falls.  Memory and ability to understand (cognition).  Work and work Statistician. Screening  You may have the following tests or measurements:  Height, weight, and BMI.  Blood pressure.  Lipid and cholesterol levels. These may be checked every 5 years, or more frequently if you are over 66 years old.  Skin check.  Lung cancer screening. You may have this screening every year starting at age 61 if you have a 30-pack-year history of smoking and currently smoke or have quit within the past 15 years.  Fecal occult blood test (FOBT) of the stool. You may have this test every year starting at age 1.  Flexible sigmoidoscopy or colonoscopy. You may have a sigmoidoscopy every 5 years or a colonoscopy every 10 years starting at age 70.  Prostate cancer screening. Recommendations will vary depending on your family history and other risks.  Hepatitis  C blood test.  Hepatitis B blood test.  Sexually transmitted disease (STD) testing.  Diabetes screening. This is done by checking your blood sugar (glucose) after you have not eaten for a while (fasting). You may have this done every 1-3 years.  Abdominal aortic aneurysm (AAA) screening. You may need this  if you are a current or former smoker.  Osteoporosis. You may be screened starting at age 3 if you are at high risk. Talk with your health care provider about your test results, treatment options, and if necessary, the need for more tests. Vaccines  Your health care provider may recommend certain vaccines, such as:  Influenza vaccine. This is recommended every year.  Tetanus, diphtheria, and acellular pertussis (Tdap, Td) vaccine. You may need a Td booster every 10 years.  Zoster vaccine. You may need this after age 69.  Pneumococcal 13-valent conjugate (PCV13) vaccine. One dose is recommended after age 73.  Pneumococcal polysaccharide (PPSV23) vaccine. One dose is recommended after age 16. Talk to your health care provider about which screenings and vaccines you need and how often you need them. This information is not intended to replace advice given to you by your health care provider. Make sure you discuss any questions you have with your health care provider. Document Released: 02/25/2015 Document Revised: 10/19/2015 Document Reviewed: 11/30/2014 Elsevier Interactive Patient Education  2017 [place] Prevention in the Home Falls can cause injuries. They can happen to people of all ages. There are many things you can do to make your home safe and to help prevent falls. What can I do on the outside of my home?  Regularly fix the edges of walkways and driveways and fix any cracks.  Remove anything that might make you trip as you walk through a door, such as a raised step or threshold.  Trim any bushes or trees on the path to your home.  Use bright outdoor lighting.  Clear any walking paths of anything that might make someone trip, such as rocks or tools.  Regularly check to see if handrails are loose or broken. Make sure that both sides of any steps have handrails.  Any raised decks and porches should have guardrails on the edges.  Have any leaves, snow, or ice  cleared regularly.  Use sand or salt on walking paths during winter.  Clean up any spills in your garage right away. This includes oil or grease spills. What can I do in the bathroom?  Use night lights.  Install grab bars by the toilet and in the tub and shower. Do not use towel bars as grab bars.  Use non-skid mats or decals in the tub or shower.  If you need to sit down in the shower, use a plastic, non-slip stool.  Keep the floor dry. Clean up any water that spills on the floor as soon as it happens.  Remove soap buildup in the tub or shower regularly.  Attach bath mats securely with double-sided non-slip rug tape.  Do not have throw rugs and other things on the floor that can make you trip. What can I do in the bedroom?  Use night lights.  Make sure that you have a light by your bed that is easy to reach.  Do not use any sheets or blankets that are too big for your bed. They should not hang down onto the floor.  Have a firm chair that has side arms. You can use this for support while you get  dressed.  Do not have throw rugs and other things on the floor that can make you trip. What can I do in the kitchen?  Clean up any spills right away.  Avoid walking on wet floors.  Keep items that you use a lot in easy-to-reach places.  If you need to reach something above you, use a strong step stool that has a grab bar.  Keep electrical cords out of the way.  Do not use floor polish or wax that makes floors slippery. If you must use wax, use non-skid floor wax.  Do not have throw rugs and other things on the floor that can make you trip. What can I do with my stairs?  Do not leave any items on the stairs.  Make sure that there are handrails on both sides of the stairs and use them. Fix handrails that are broken or loose. Make sure that handrails are as long as the stairways.  Check any carpeting to make sure that it is firmly attached to the stairs. Fix any carpet that  is loose or worn.  Avoid having throw rugs at the top or bottom of the stairs. If you do have throw rugs, attach them to the floor with carpet tape.  Make sure that you have a light switch at the top of the stairs and the bottom of the stairs. If you do not have them, ask someone to add them for you. What else can I do to help prevent falls?  Wear shoes that:  Do not have high heels.  Have rubber bottoms.  Are comfortable and fit you well.  Are closed at the toe. Do not wear sandals.  If you use a stepladder:  Make sure that it is fully opened. Do not climb a closed stepladder.  Make sure that both sides of the stepladder are locked into place.  Ask someone to hold it for you, if possible.  Clearly mark and make sure that you can see:  Any grab bars or handrails.  First and last steps.  Where the edge of each step is.  Use tools that help you move around (mobility aids) if they are needed. These include:  Canes.  Walkers.  Scooters.  Crutches.  Turn on the lights when you go into a dark area. Replace any light bulbs as soon as they burn out.  Set up your furniture so you have a clear path. Avoid moving your furniture around.  If any of your floors are uneven, fix them.  If there are any pets around you, be aware of where they are.  Review your medicines with your doctor. Some medicines can make you feel dizzy. This can increase your chance of falling. Ask your doctor what other things that you can do to help prevent falls. This information is not intended to replace advice given to you by your health care provider. Make sure you discuss any questions you have with your health care provider. Document Released: 11/25/2008 Document Revised: 07/07/2015 Document Reviewed: 03/05/2014 Elsevier Interactive Patient Education  2017 [employer].

## 2019-07-01 ENCOUNTER — Telehealth: Payer: Self-pay

## 2019-07-01 NOTE — Telephone Encounter (Signed)
Rep calling to provide verbal orders .  One week 1 Two week 2 One week 1

## 2019-07-01 NOTE — Telephone Encounter (Signed)
FYI. Returned call and LMOVM for verbal ok.

## 2019-07-01 NOTE — Telephone Encounter (Signed)
Called Gary Butler to verify what Verbal orders are for.

## 2019-07-01 NOTE — Telephone Encounter (Signed)
They need verbal orders to see patient for physical therapy 1 wk 1, 2 wk 2, 1 wk 1. Ok to [facility] I verified phone number.

## 2019-07-06 ENCOUNTER — Other Ambulatory Visit: Payer: Self-pay | Admitting: General Practice

## 2019-07-06 MED ORDER — TRIAMTERENE-HCTZ 75-50 MG PO TABS: 0.5000 | ORAL_TABLET | Freq: Every morning | ORAL | 1 refills | Status: DC

## 2019-07-06 MED ORDER — FUROSEMIDE 20 MG PO TABS: ORAL_TABLET | ORAL | 1 refills | Status: DC

## 2019-07-06 MED ORDER — POTASSIUM CHLORIDE ER 10 MEQ PO CPCR: ORAL_CAPSULE | ORAL | 1 refills | Status: DC

## 2019-07-06 MED ORDER — ATORVASTATIN CALCIUM 40 MG PO TABS: ORAL_TABLET | ORAL | 1 refills | Status: DC

## 2019-07-06 MED ORDER — MELOXICAM 15 MG PO TABS: ORAL_TABLET | ORAL | 1 refills | Status: DC

## 2019-07-07 ENCOUNTER — Other Ambulatory Visit: Payer: Self-pay | Admitting: *Deleted

## 2019-07-07 MED ORDER — OMEPRAZOLE 40 MG PO CPDR: DELAYED_RELEASE_CAPSULE | ORAL | 0 refills | Status: DC

## 2019-07-20 DIAGNOSIS — L814 Other melanin hyperpigmentation: Secondary | ICD-10-CM | POA: Diagnosis not present

## 2019-07-20 DIAGNOSIS — R197 Diarrhea, unspecified: Secondary | ICD-10-CM | POA: Diagnosis not present

## 2019-07-20 DIAGNOSIS — L309 Dermatitis, unspecified: Secondary | ICD-10-CM | POA: Diagnosis not present

## 2019-07-20 DIAGNOSIS — L570 Actinic keratosis: Secondary | ICD-10-CM | POA: Diagnosis not present

## 2019-07-20 DIAGNOSIS — L821 Other seborrheic keratosis: Secondary | ICD-10-CM | POA: Diagnosis not present

## 2019-07-20 DIAGNOSIS — D2272 Melanocytic nevi of left lower limb, including hip: Secondary | ICD-10-CM | POA: Diagnosis not present

## 2019-07-20 DIAGNOSIS — Z85828 Personal history of other malignant neoplasm of skin: Secondary | ICD-10-CM | POA: Diagnosis not present

## 2019-07-20 DIAGNOSIS — L578 Other skin changes due to chronic exposure to nonionizing radiation: Secondary | ICD-10-CM | POA: Diagnosis not present

## 2019-07-20 DIAGNOSIS — D225 Melanocytic nevi of trunk: Secondary | ICD-10-CM | POA: Diagnosis not present

## 2019-07-20 DIAGNOSIS — Z86018 Personal history of other benign neoplasm: Secondary | ICD-10-CM | POA: Diagnosis not present

## 2019-07-21 ENCOUNTER — Encounter: Payer: Self-pay | Admitting: Family Medicine

## 2019-07-21 ENCOUNTER — Telehealth (INDEPENDENT_AMBULATORY_CARE_PROVIDER_SITE_OTHER): Payer: Medicare PPO | Admitting: Family Medicine

## 2019-07-21 ENCOUNTER — Other Ambulatory Visit: Payer: Self-pay

## 2019-07-21 DIAGNOSIS — R197 Diarrhea, unspecified: Secondary | ICD-10-CM | POA: Diagnosis not present

## 2019-07-21 NOTE — Progress Notes (Signed)
Virtual Visit via Video   I connected with patient on 07/21/19 at  9:30 AM EDT by a video enabled telemedicine application and verified that I am speaking with the correct person using two identifiers.  Location patient: Home Location provider: Acupuncturist, Office Persons participating in the virtual visit: Patient, Provider, [place] (Jess B)  I discussed the limitations of evaluation and management by telemedicine and the availability of in person appointments. The patient expressed understanding and agreed to proceed.  Subjective:   HPI:   Diarrhea- sxs started 3 weeks ago.  'explosive diarrhea'.  No cramps, no abd pain.  Pt has lost 22 lbs.  Around same time as sxs started, he restarted Pioglitazone.  No fevers.  Having 3-4 stools/day.  'I've had some bad accidents'.  No blood in stool.  Stool described as 'crunchy candy'.  No relation to eating- occurred once while sleeping.  Pt went to UC and Imodium was recommended but he has not started this yet.  No one in household w/ similar sxs.  ROS:   See pertinent positives and negatives per HPI.  Patient Active Problem List   Diagnosis Date Noted  . Dyspnea on exertion 02/19/2017  . Bilateral lower extremity edema 07/06/2015  . CHF (congestive heart failure) ([place]) 06/27/2015  . Fungal dermatitis 06/27/2015  . Edema 06/20/2015  . Increasing shortness of breath 06/20/2015  . Lumbar radiculopathy, acute 03/09/2015  . Essential hypertension 12/27/2014  . Type 2 diabetes mellitus without complication, without long-term current use of insulin ([place]) 12/27/2014  . Mild aortic stenosis 06/28/2014  . Memory loss 04/29/2014  . Tinnitus of both ears 02/17/2014  . Wellness examination 12/04/2013  . Constipation due to slow transit 08/18/2013  . Morbid obesity due to excess calories ([place]) 06/23/2013  . S/P left TKA 06/22/2013  . Meniere disease 04/21/2013  . Benign paroxysmal positional vertigo 04/21/2013  . Psoriatic arthritis ([place])  05/14/2012  . General medical exam 01/28/2012  . Cellulitis of eyelid 09/25/2011  . Diverticulitis 06/20/2011  . Erythrocytosis 05/18/2011  . S/P TKR (total knee replacement) 12/31/2010  . Obesity 12/31/2010  . Hyperlipidemia 12/22/2010  . BPH (benign prostatic hyperplasia) 12/22/2010  . Gout 12/22/2010  . Obstructive sleep apnea 04/04/2009    Social History   Tobacco Use  . Smoking status: Never Smoker  . Smokeless tobacco: Never Used  Substance Use Topics  . Alcohol use: Yes    Alcohol/week: 0.0 standard drinks    Comment: Once a month    Current Outpatient Medications:  .  Albuterol Sulfate (PROAIR RESPICLICK) 161 (90 Base) MCG/ACT AEPB, Inhale 2 puffs into the lungs every 6 (six) hours as needed (for wheezing or shortness of breath)., Disp: 1 each, Rfl: 3 .  allopurinol (ZYLOPRIM) 300 MG tablet, TAKE 1 TABLET(300 MG) BY MOUTH DAILY, Disp: 30 tablet, Rfl: 0 .  aspirin 81 MG tablet, Take 81 mg by mouth daily., Disp: , Rfl:  .  atorvastatin (LIPITOR) 40 MG tablet, TAKE 1 TABLET(40 MG) BY MOUTH EVERY EVENING, Disp: 90 tablet, Rfl: 1 .  bisoprolol (ZEBETA) 5 MG tablet, TAKE 1 TABLET(5 MG) BY MOUTH DAILY, Disp: 30 tablet, Rfl: 0 .  cetirizine (ZYRTEC) 10 MG tablet, TAKE 1 TABLET(10 MG) BY MOUTH DAILY, Disp: 30 tablet, Rfl: 0 .  chlorpheniramine-HYDROcodone (TUSSIONEX PENNKINETIC ER) 10-8 MG/5ML SUER, Take 5 mLs by mouth every 12 (twelve) hours as needed., Disp: 70 mL, Rfl: 0 .  Cholecalciferol (VITAMIN D) 2000 UNITS tablet, Take 2,000 Units by mouth daily., Disp: ,  Rfl:  .  Cyanocobalamin (B-12) 2000 MCG TABS, Take by mouth., Disp: , Rfl:  .  donepezil (ARICEPT) 5 MG tablet, Take 1/2 tablet daily for 1 month, then increase to 1 tablet daily, Disp: 30 tablet, Rfl: 11 .  empagliflozin (JARDIANCE) 10 MG TABS tablet, Take 10 mg by mouth daily before breakfast., Disp: 30 tablet, Rfl: 6 .  etodolac (LODINE) 400 MG tablet, Take by mouth., Disp: , Rfl:  .  fluticasone (FLONASE) 50 MCG/ACT  nasal spray, Place 2 sprays into both nostrils daily., Disp: 16 g, Rfl: 6 .  folic acid (FOLVITE) 1 MG tablet, Take 1 mg by mouth daily. , Disp: , Rfl:  .  furosemide (LASIX) 20 MG tablet, TAKE 1 TABLET(20 MG) BY MOUTH DAILY, Disp: 90 tablet, Rfl: 1 .  meloxicam (MOBIC) 15 MG tablet, Take 1 tablet by mouth daily., Disp: 90 tablet, Rfl: 1 .  MYRBETRIQ 25 MG TB24 tablet, , Disp: , Rfl:  .  nystatin ointment (MYCOSTATIN), Apply 1 application topically 2 (two) times daily. (Patient taking differently: Apply 1 application topically 2 (two) times daily as needed. ), Disp: 90 g, Rfl: 1 .  omeprazole (PRILOSEC) 40 MG capsule, TAKE 1 CAPSULE 30-60 MINUTES BEFORE YOUR FIRST AND LAST MEAL OF THE DAY, Disp: 180 capsule, Rfl: 0 .  pioglitazone (ACTOS) 30 MG tablet, Take 30 mg by mouth daily. , Disp: , Rfl:  .  potassium chloride (MICRO-K) 10 MEQ CR capsule, TAKE 1 CAPSULE BY MOUTH EVERY DAY, Disp: 90 capsule, Rfl: 1 .  sertraline (ZOLOFT) 50 MG tablet, TAKE 1 TABLET(50 MG) BY MOUTH DAILY., Disp: 90 tablet, Rfl: 1 .  telmisartan (MICARDIS) 40 MG tablet, TAKE 1 TABLET(40 MG) BY MOUTH DAILY, Disp: 30 tablet, Rfl: 11 .  tiZANidine (ZANAFLEX) 4 MG tablet, Take 1 tablet (4 mg total) by mouth every 6 (six) hours as needed for muscle spasms., Disp: 30 tablet, Rfl: 0 .  triamterene-hydrochlorothiazide (MAXZIDE) 75-50 MG tablet, Take 0.5 tablets by mouth every morning. Please call the office to schedule an appointment, Disp: 45 tablet, Rfl: 1 .  UNABLE TO FIND, Med Name: BiPap with [facility], Disp: , Rfl:   Allergies  Allergen Reactions  . Lyrica [Pregabalin] Swelling  . Penicillins Swelling  . Levofloxacin Rash    Objective:   There were no vitals taken for this visit.  AAOx3, NAD NCAT, EOMI No obvious CN deficits Coloring WNL Pt is able to speak clearly, coherently without shortness of breath or increased work of breathing.  Thought process is linear.  Mood is appropriate.   Assessment and Plan:    Diarrhea- new.  Pt reports 3 weeks of sxs- which would correspond to when pharmacy filled his Pioglitazone.  Pt was aware that he wasn't supposed to be taking his Actos but he did not realize they were the same thing.  He now knows to stop this.  Given his lack of abd cramping, pain, blood I doubt this is infectious.  Since he reports 22 lb weight gain will get labs to assess electrolytes.  Encouraged him to start Imodium as suggested by UC.  Reviewed supportive care and red flags that should prompt return.  Pt expressed understanding and is in agreement w/ plan.    Annye Asa, MD 07/21/2019

## 2019-07-21 NOTE — Progress Notes (Signed)
I have discussed the procedure for the virtual visit with the patient who has given consent to proceed with assessment and treatment.   Pt unable to obtain vitals.   Charliene Inoue L Anyah Swallow, CMA     

## 2019-07-27 ENCOUNTER — Telehealth: Payer: Self-pay | Admitting: Family Medicine

## 2019-07-27 NOTE — Telephone Encounter (Signed)
LM for pt to call back to scheduled labs

## 2019-07-29 ENCOUNTER — Other Ambulatory Visit: Payer: Self-pay

## 2019-07-29 ENCOUNTER — Telehealth: Payer: Self-pay | Admitting: Family Medicine

## 2019-07-29 ENCOUNTER — Ambulatory Visit (INDEPENDENT_AMBULATORY_CARE_PROVIDER_SITE_OTHER): Payer: Medicare PPO | Admitting: Family Medicine

## 2019-07-29 DIAGNOSIS — Z6841 Body Mass Index (BMI) 40.0 and over, adult: Secondary | ICD-10-CM | POA: Diagnosis not present

## 2019-07-29 DIAGNOSIS — H8109 Meniere's disease, unspecified ear: Secondary | ICD-10-CM | POA: Diagnosis not present

## 2019-07-29 DIAGNOSIS — R197 Diarrhea, unspecified: Secondary | ICD-10-CM

## 2019-07-29 DIAGNOSIS — E785 Hyperlipidemia, unspecified: Secondary | ICD-10-CM | POA: Diagnosis not present

## 2019-07-29 DIAGNOSIS — I509 Heart failure, unspecified: Secondary | ICD-10-CM | POA: Diagnosis not present

## 2019-07-29 DIAGNOSIS — L4050 Arthropathic psoriasis, unspecified: Secondary | ICD-10-CM | POA: Diagnosis not present

## 2019-07-29 DIAGNOSIS — I10 Essential (primary) hypertension: Secondary | ICD-10-CM | POA: Diagnosis not present

## 2019-07-29 DIAGNOSIS — E119 Type 2 diabetes mellitus without complications: Secondary | ICD-10-CM | POA: Diagnosis not present

## 2019-07-29 LAB — CBC WITH DIFFERENTIAL/PLATELET
Basophils Absolute: 0 10*3/uL (ref 0.0–0.1)
Basophils Relative: 0.6 % (ref 0.0–3.0)
Eosinophils Absolute: 0.3 10*3/uL (ref 0.0–0.7)
Eosinophils Relative: 4.5 % (ref 0.0–5.0)
HCT: 47 % (ref 39.0–52.0)
Hemoglobin: 15.8 g/dL (ref 13.0–17.0)
Lymphocytes Relative: 17.7 % (ref 12.0–46.0)
Lymphs Abs: 1.1 10*3/uL (ref 0.7–4.0)
MCHC: 33.5 g/dL (ref 30.0–36.0)
MCV: 95.8 fl (ref 78.0–100.0)
Monocytes Absolute: 0.4 10*3/uL (ref 0.1–1.0)
Monocytes Relative: 6.5 % (ref 3.0–12.0)
Neutro Abs: 4.3 10*3/uL (ref 1.4–7.7)
Neutrophils Relative %: 70.7 % (ref 43.0–77.0)
Platelets: 200 10*3/uL (ref 150.0–400.0)
RBC: 4.91 Mil/uL (ref 4.22–5.81)
RDW: 14.6 % (ref 11.5–15.5)
WBC: 6 10*3/uL (ref 4.0–10.5)

## 2019-07-29 LAB — HEPATIC FUNCTION PANEL
ALT: 17 U/L (ref 0–53)
AST: 17 U/L (ref 0–37)
Albumin: 3.6 g/dL (ref 3.5–5.2)
Alkaline Phosphatase: 88 U/L (ref 39–117)
Bilirubin, Direct: 0.5 mg/dL — ABNORMAL HIGH (ref 0.0–0.3)
Total Bilirubin: 1.7 mg/dL — ABNORMAL HIGH (ref 0.2–1.2)
Total Protein: 5.6 g/dL — ABNORMAL LOW (ref 6.0–8.3)

## 2019-07-29 LAB — BASIC METABOLIC PANEL
BUN: 11 mg/dL (ref 6–23)
CO2: 32 mEq/L (ref 19–32)
Calcium: 9.1 mg/dL (ref 8.4–10.5)
Chloride: 98 mEq/L (ref 96–112)
Creatinine, Ser: 0.9 mg/dL (ref 0.40–1.50)
GFR: 82.33 mL/min (ref >60.00)
Glucose, Bld: 179 mg/dL — ABNORMAL HIGH (ref 70–99)
Potassium: 3.5 mEq/L (ref 3.5–5.1)
Sodium: 137 mEq/L (ref 135–145)

## 2019-07-29 LAB — TSH: TSH: 2.33 u[IU]/mL (ref 0.35–4.50)

## 2019-07-29 NOTE — Telephone Encounter (Signed)
Faxed orders to [facility] and sent a copy to scan.

## 2019-07-30 ENCOUNTER — Other Ambulatory Visit (INDEPENDENT_AMBULATORY_CARE_PROVIDER_SITE_OTHER): Payer: Medicare PPO

## 2019-07-30 DIAGNOSIS — R7309 Other abnormal glucose: Secondary | ICD-10-CM

## 2019-07-30 LAB — HEMOGLOBIN A1C: Hgb A1c MFr Bld: 8.2 % — ABNORMAL HIGH (ref 4.6–6.5)

## 2019-08-10 ENCOUNTER — Other Ambulatory Visit: Payer: Self-pay

## 2019-08-10 ENCOUNTER — Encounter: Payer: Self-pay | Admitting: Family Medicine

## 2019-08-10 ENCOUNTER — Ambulatory Visit: Payer: Medicare PPO | Admitting: Family Medicine

## 2019-08-10 VITALS — BP 121/68 | HR 69 | Temp 97.8°F | Resp 16 | Ht 72.0 in | Wt 325.5 lb

## 2019-08-10 DIAGNOSIS — R197 Diarrhea, unspecified: Secondary | ICD-10-CM

## 2019-08-10 DIAGNOSIS — E1140 Type 2 diabetes mellitus with diabetic neuropathy, unspecified: Secondary | ICD-10-CM | POA: Diagnosis not present

## 2019-08-10 DIAGNOSIS — R413 Other amnesia: Secondary | ICD-10-CM

## 2019-08-10 MED ORDER — DONEPEZIL HCL 10 MG PO TABS
10.0000 mg | ORAL_TABLET | Freq: Every day | ORAL | 3 refills | Status: DC
Start: 2019-08-10 — End: 2019-11-23

## 2019-08-10 NOTE — Assessment & Plan Note (Signed)
Chronic problem.  Tolerating Jardiance w/o difficulty.  No longer on Actos or Metformin due to CHF.  A1C down to 8.2  Given his 25 lb weight loss will hold off on med adjustment at this time but continue to follow closely.  Pt expressed understanding and is in agreement w/ plan.

## 2019-08-10 NOTE — Patient Instructions (Signed)
Follow up in 3-4 months to recheck diabetes and cholesterol No need for repeat labs today- YAY! DOUBLE the Donepezil to 10mg  (2 of what you have at home and 1 of the new prescription) Schedule an appt w/ Dr Delice Lesch to discuss the memory issues Keep up the good work on healthy diet and regular exercise- you're doing great! Call with any questions or concerns Have a great summer!

## 2019-08-10 NOTE — Assessment & Plan Note (Signed)
Deteriorated.  Pt is having more difficulty w/ his memory.  Wife leaves him reminders and notes all over the house to help him.  He got lost coming to appt today.  Will increase Aricept to 10mg  daily and encouraged him to schedule f/u w/ Dr Delice Lesch.  Pt expressed understanding and is in agreement w/ plan.

## 2019-08-10 NOTE — Progress Notes (Signed)
   Subjective:    Patient ID: Gary Butler, male    DOB: 02-25-1945, 74 y.o.   MRN: [id number]  HPI DM- chronic problem.  On Jardiance 10mg  daily.  Due to CHF is not on Metformin or Actos.  UTD on eye exam (due next month- scheduled), foot exam.  On ARB for renal protection.  He is down 25 lbs since last visit.  Most recent A1C 8.2  Down from 9.  No CP, SOB above baseline, HAs, visual changes.  Denies symptomatic lows.  + neuropathy of feet  Diarrhea- pt reports sxs are improving since stopping Actos.  'mostly gone'.  Memory loss- pt got lost on way to office this AM.  Short term memory deficits.  Pt reports sxs are worsening recently.  Currently on Donepezil 5mg  daily  Review of Systems For ROS see HPI   This visit occurred during the SARS-CoV-2 public health emergency.  Safety protocols were in place, including screening questions prior to the visit, additional usage of staff PPE, and extensive cleaning of exam room while observing appropriate contact time as indicated for disinfecting solutions.       Objective:   Physical Exam Vitals reviewed.  Constitutional:      General: He is not in acute distress.    Appearance: He is well-developed. He is obese. He is not ill-appearing.  HENT:     Head: Normocephalic and atraumatic.  Eyes:     Conjunctiva/sclera: Conjunctivae normal.     Pupils: Pupils are equal, round, and reactive to light.  Neck:     Thyroid: No thyromegaly.  Cardiovascular:     Rate and Rhythm: Normal rate and regular rhythm.     Heart sounds: Normal heart sounds. No murmur heard.   Pulmonary:     Effort: Pulmonary effort is normal. No respiratory distress.     Breath sounds: Normal breath sounds.  Abdominal:     General: Bowel sounds are normal. There is no distension.     Palpations: Abdomen is soft.  Musculoskeletal:     Cervical back: Normal range of motion and neck supple.  Lymphadenopathy:     Cervical: No cervical adenopathy.  Skin:    General: Skin is  warm and dry.  Neurological:     Mental Status: He is alert and oriented to person, place, and time.     Cranial Nerves: No cranial nerve deficit.  Psychiatric:        Behavior: Behavior normal.           Assessment & Plan:  Diarrhea- nearly resolved since stopping Actos.  No further w/u at this time

## 2019-08-13 ENCOUNTER — Other Ambulatory Visit: Payer: Self-pay

## 2019-08-13 MED ORDER — BISOPROLOL FUMARATE 5 MG PO TABS: ORAL_TABLET | ORAL | 0 refills | Status: DC

## 2019-08-20 ENCOUNTER — Other Ambulatory Visit: Payer: Self-pay

## 2019-08-20 DIAGNOSIS — R413 Other amnesia: Secondary | ICD-10-CM

## 2019-08-25 ENCOUNTER — Encounter: Payer: Self-pay | Admitting: Family Medicine

## 2019-08-25 DIAGNOSIS — E119 Type 2 diabetes mellitus without complications: Secondary | ICD-10-CM | POA: Diagnosis not present

## 2019-08-25 LAB — HM DIABETES EYE EXAM

## 2019-08-31 ENCOUNTER — Ambulatory Visit (INDEPENDENT_AMBULATORY_CARE_PROVIDER_SITE_OTHER): Payer: Medicare PPO | Admitting: Podiatry

## 2019-08-31 ENCOUNTER — Other Ambulatory Visit: Payer: Self-pay

## 2019-08-31 ENCOUNTER — Encounter: Payer: Self-pay | Admitting: Podiatry

## 2019-08-31 DIAGNOSIS — R600 Localized edema: Secondary | ICD-10-CM

## 2019-08-31 DIAGNOSIS — E119 Type 2 diabetes mellitus without complications: Secondary | ICD-10-CM

## 2019-08-31 DIAGNOSIS — M79674 Pain in right toe(s): Secondary | ICD-10-CM

## 2019-08-31 DIAGNOSIS — B351 Tinea unguium: Secondary | ICD-10-CM | POA: Diagnosis not present

## 2019-08-31 DIAGNOSIS — M79675 Pain in left toe(s): Secondary | ICD-10-CM

## 2019-08-31 NOTE — Progress Notes (Signed)
Subjective: Gary Butler presents today for follow up of preventative diabetic foot care and painful mycotic nails b/l that are difficult to trim. Pain interferes with ambulation. Aggravating factors include wearing enclosed shoe gear. Pain is relieved with periodic professional debridement.   Gary Butler voices no new pedal concerns on today's visit.  He states he retired from [employer] duties just in time due to the pandemic.  Allergies  Allergen Reactions  . Lyrica [Pregabalin] Swelling  . Penicillins Swelling  . Levofloxacin Rash    Objective: There were no vitals filed for this visit.  Pt 74 y.o. year old Caucasian male, morbidly obese  in NAD. AAO x 3.   PCP is Dr. Annye Asa and last visit was 08/10/2019.  Vascular Examination:  Capillary refill time to digits immediate b/l. Palpable pedal pulses b/l LE. Pedal hair present. Lower extremity skin temperature gradient within normal limits. Dependent rubor noted b/l lower extremities. Nonpitting edema noted b/l lower extremities.  Dermatological Examination: Pedal skin with normal turgor, texture and tone bilaterally. No open wounds bilaterally. Toenails 1-5 b/l elongated, dystrophic, thickened, crumbly with subungual debris and tenderness to dorsal palpation.  Musculoskeletal: Normal muscle strength 5/5 to all lower extremity muscle groups bilaterally, no gross bony deformities bilaterally, no pain crepitus or joint limitation noted with ROM b/l and patient ambulates independent of any assistive aids  Neurological: Protective sensation intact 5/5 intact bilaterally with 10g monofilament b/l. Babinski reflex negative b/l. Clonus negative b/l.  Assessment: 1. Pain due to onychomycosis of toenails of both feet   2. Lower extremity edema   3. Controlled type 2 diabetes mellitus without complication, without long-term current use of insulin ([place])    Plan: -Continue diabetic foot care principles. Literature dispensed on today.   -Toenails 1-5 b/l were debrided in length and girth with sterile nail nippers and dremel without iatrogenic bleeding.  -Patient to continue soft, supportive shoe gear daily. -Patient to report any pedal injuries to medical professional immediately. -Patient/POA to call should there be question/concern in the interim.  Return in about 3 months (around 12/01/2019) for diabetic nail trim.

## 2019-09-01 DIAGNOSIS — R1111 Vomiting without nausea: Secondary | ICD-10-CM | POA: Diagnosis not present

## 2019-09-01 DIAGNOSIS — R197 Diarrhea, unspecified: Secondary | ICD-10-CM | POA: Diagnosis not present

## 2019-09-01 DIAGNOSIS — R159 Full incontinence of feces: Secondary | ICD-10-CM | POA: Diagnosis not present

## 2019-09-01 DIAGNOSIS — R1310 Dysphagia, unspecified: Secondary | ICD-10-CM | POA: Diagnosis not present

## 2019-09-02 DIAGNOSIS — R0602 Shortness of breath: Secondary | ICD-10-CM | POA: Diagnosis not present

## 2019-09-02 DIAGNOSIS — L401 Generalized pustular psoriasis: Secondary | ICD-10-CM | POA: Diagnosis not present

## 2019-09-02 DIAGNOSIS — M25511 Pain in right shoulder: Secondary | ICD-10-CM | POA: Diagnosis not present

## 2019-09-02 DIAGNOSIS — M1A09X0 Idiopathic chronic gout, multiple sites, without tophus (tophi): Secondary | ICD-10-CM | POA: Diagnosis not present

## 2019-09-02 DIAGNOSIS — M150 Primary generalized (osteo)arthritis: Secondary | ICD-10-CM | POA: Diagnosis not present

## 2019-09-02 DIAGNOSIS — L4050 Arthropathic psoriasis, unspecified: Secondary | ICD-10-CM | POA: Diagnosis not present

## 2019-09-02 DIAGNOSIS — G3184 Mild cognitive impairment, so stated: Secondary | ICD-10-CM | POA: Diagnosis not present

## 2019-09-02 DIAGNOSIS — Z79899 Other long term (current) drug therapy: Secondary | ICD-10-CM | POA: Diagnosis not present

## 2019-09-02 DIAGNOSIS — Z6841 Body Mass Index (BMI) 40.0 and over, adult: Secondary | ICD-10-CM | POA: Diagnosis not present

## 2019-09-10 DIAGNOSIS — G4733 Obstructive sleep apnea (adult) (pediatric): Secondary | ICD-10-CM | POA: Diagnosis not present

## 2019-09-11 ENCOUNTER — Other Ambulatory Visit: Payer: Self-pay | Admitting: *Deleted

## 2019-09-11 ENCOUNTER — Telehealth: Payer: Self-pay | Admitting: *Deleted

## 2019-09-11 MED ORDER — BISOPROLOL FUMARATE 5 MG PO TABS: ORAL_TABLET | ORAL | 0 refills | Status: DC

## 2019-09-11 NOTE — Telephone Encounter (Signed)
Called and spoke with pharmacy and advised them that the patient's pcp would need to refill Bisoprolol Fumarate 5mg  from now on.  Pharmacy verbalized understanding and changed to his pcp.  Nothing further needed.

## 2019-09-14 DIAGNOSIS — E119 Type 2 diabetes mellitus without complications: Secondary | ICD-10-CM | POA: Diagnosis not present

## 2019-09-14 DIAGNOSIS — F339 Major depressive disorder, recurrent, unspecified: Secondary | ICD-10-CM | POA: Diagnosis not present

## 2019-09-14 DIAGNOSIS — Z7984 Long term (current) use of oral hypoglycemic drugs: Secondary | ICD-10-CM | POA: Diagnosis not present

## 2019-09-14 DIAGNOSIS — Z881 Allergy status to other antibiotic agents status: Secondary | ICD-10-CM | POA: Diagnosis not present

## 2019-09-14 DIAGNOSIS — Z791 Long term (current) use of non-steroidal anti-inflammatories (NSAID): Secondary | ICD-10-CM | POA: Diagnosis not present

## 2019-09-14 DIAGNOSIS — Z823 Family history of stroke: Secondary | ICD-10-CM | POA: Diagnosis not present

## 2019-09-14 DIAGNOSIS — E785 Hyperlipidemia, unspecified: Secondary | ICD-10-CM | POA: Diagnosis not present

## 2019-09-14 DIAGNOSIS — N3281 Overactive bladder: Secondary | ICD-10-CM | POA: Diagnosis not present

## 2019-09-14 DIAGNOSIS — F0390 Unspecified dementia without behavioral disturbance: Secondary | ICD-10-CM | POA: Diagnosis not present

## 2019-09-14 DIAGNOSIS — Z6841 Body Mass Index (BMI) 40.0 and over, adult: Secondary | ICD-10-CM | POA: Diagnosis not present

## 2019-09-14 DIAGNOSIS — Z880 Allergy status to penicillin: Secondary | ICD-10-CM | POA: Diagnosis not present

## 2019-09-14 DIAGNOSIS — Z833 Family history of diabetes mellitus: Secondary | ICD-10-CM | POA: Diagnosis not present

## 2019-09-14 DIAGNOSIS — K219 Gastro-esophageal reflux disease without esophagitis: Secondary | ICD-10-CM | POA: Diagnosis not present

## 2019-09-14 DIAGNOSIS — J309 Allergic rhinitis, unspecified: Secondary | ICD-10-CM | POA: Diagnosis not present

## 2019-09-14 DIAGNOSIS — Z7982 Long term (current) use of aspirin: Secondary | ICD-10-CM | POA: Diagnosis not present

## 2019-09-14 DIAGNOSIS — I10 Essential (primary) hypertension: Secondary | ICD-10-CM | POA: Diagnosis not present

## 2019-09-14 DIAGNOSIS — R32 Unspecified urinary incontinence: Secondary | ICD-10-CM | POA: Diagnosis not present

## 2019-09-15 DIAGNOSIS — L719 Rosacea, unspecified: Secondary | ICD-10-CM | POA: Diagnosis not present

## 2019-09-15 DIAGNOSIS — D692 Other nonthrombocytopenic purpura: Secondary | ICD-10-CM | POA: Diagnosis not present

## 2019-09-15 DIAGNOSIS — L409 Psoriasis, unspecified: Secondary | ICD-10-CM | POA: Diagnosis not present

## 2019-09-16 ENCOUNTER — Other Ambulatory Visit: Payer: Self-pay

## 2019-09-22 ENCOUNTER — Other Ambulatory Visit: Payer: Self-pay | Admitting: Internal Medicine

## 2019-09-28 ENCOUNTER — Encounter: Payer: Self-pay | Admitting: Counselor

## 2019-09-28 ENCOUNTER — Ambulatory Visit: Payer: Medicare PPO

## 2019-09-28 ENCOUNTER — Ambulatory Visit (INDEPENDENT_AMBULATORY_CARE_PROVIDER_SITE_OTHER): Payer: Medicare PPO | Admitting: Counselor

## 2019-09-28 ENCOUNTER — Other Ambulatory Visit: Payer: Self-pay

## 2019-09-28 DIAGNOSIS — G3184 Mild cognitive impairment, so stated: Secondary | ICD-10-CM | POA: Diagnosis not present

## 2019-09-28 DIAGNOSIS — I6781 Acute cerebrovascular insufficiency: Secondary | ICD-10-CM

## 2019-09-28 DIAGNOSIS — F09 Unspecified mental disorder due to known physiological condition: Secondary | ICD-10-CM

## 2019-09-28 NOTE — Progress Notes (Signed)
[facility]  Patient Name: Gary Butler MRN: [id number] Date of Birth: Oct 18, 1945 Age: 74 y.o. Education: 18 years  Referral Circumstances and Background Information  Gary Butler is a 74 y.o., right-hand dominant, married man with a history of subjective cognitive decline since approximately 2014 but normal neuropsychological testing in 2018. He is being followed by Dr. Delice Lesch and when he was last seen in April, 2020, he and his wife reported significant worsening including not recalling names of people he has known for 40 years, disorientation to the date, rapid forgetting of information within minutes, and some increase in irritability. He presented to the interview with his wife but she did not wish to participate. I was able to have her fill out a rating scale and talk with her briefly, she has noticed the changes too.   On interview, the patient reported that he feels like his memory difficulties continue to get worse. Going back, he thinks his issues started around 5 years ago with "little things" and have just gotten worse. He specifically mentioned having a hard time with directions, for instance needing to write down the address for this appointment, to keep him on track. He needs to keep a detailed calendar to keep track of appointments. It is not clear, however, that his memory or thinking abilities are impaired to such a degree that he is unable to do things that he used to do. He has "adapted." On detailed review of cognitive systems, he reported that his long term memory is good, his short term memory "sucks." He has a hard time remembering names. He acknowledged rapid forgetting of information and occasional repetitive questioning but it doesn't happen frequently. He doesn't lose the month or the year but he does forget the specific day of the week. He stated that he is doing relatively well with judgment and problem solving, although he isn't doing  as much of that since COVID. With respect to mood, the patient reported that he is dispositionally a very happy person and he is doing fairly well with mood. His wife did state that he is "terrified" about his cognitive problems and has lost confidence. With respect to appetite, he has been on a diet, and has been successful at losing 51 lbs. That has helped his energy. He is sleeping adequately.  In terms of functioning, it sounds like the patient is still doing fairly well but there are some subtle changes. He is still managing finances reliably and has an organizational system that he uses where he tracks his monthly bills, it has been "a godsend." He double paid one bill but generally doesn't have a hard time. He is still managing his own medications is reliable in doing so. He organizes them himself using a pill minder. He is doing well with driving but feels like he has to focus harder to make sure he doesn't make a mistake. 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 He is doing that virtually because of COVID, he answers the phone for them. 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 He is an avid reader and was able to tell me about the books he is reading in detail. He is still independent with respect to community utilization, although he isn't doing as much related to [place].   Past Medical History and Review of Relevant Studies   Patient Active Problem List   Diagnosis Date Noted  .  Dyspnea on exertion 02/19/2017  . Bilateral lower extremity edema 07/06/2015  . CHF (congestive heart failure) ([place]) 06/27/2015  . Fungal dermatitis 06/27/2015  . Edema 06/20/2015  . Increasing shortness of breath 06/20/2015  . Lumbar radiculopathy, acute 03/09/2015  . Essential hypertension 12/27/2014  . Type 2 diabetes mellitus with diabetic neuropathy, unspecified ([place]) 12/27/2014  . Mild aortic stenosis 06/28/2014  . Memory loss  04/29/2014  . Tinnitus of both ears 02/17/2014  . Wellness examination 12/04/2013  . Constipation due to slow transit 08/18/2013  . Morbid obesity due to excess calories ([place]) 06/23/2013  . S/P left TKA 06/22/2013  . Meniere disease 04/21/2013  . Benign paroxysmal positional vertigo 04/21/2013  . Psoriatic arthritis ([place]) 05/14/2012  . General medical exam 01/28/2012  . Cellulitis of eyelid 09/25/2011  . Diverticulitis 06/20/2011  . Erythrocytosis 05/18/2011  . S/P TKR (total knee replacement) 12/31/2010  . Obesity 12/31/2010  . Hyperlipidemia 12/22/2010  . BPH (benign prostatic hyperplasia) 12/22/2010  . Gout 12/22/2010  . Obstructive sleep apnea 04/04/2009    Review of Neuroimaging and Relevant Medical History: The patient had an MRI in 2016, the images of which were not available for review. The report mentions a mild burden of leukoaraiosis, mild diffuse volume loss, and a right parietal area of FLAIR signal abnormality thought to be likely artifactual.   Current Outpatient Medications  Medication Sig Dispense Refill  . Albuterol Sulfate (PROAIR RESPICLICK) 191 (90 Base) MCG/ACT AEPB Inhale 2 puffs into the lungs every 6 (six) hours as needed (for wheezing or shortness of breath). 1 each 3  . allopurinol (ZYLOPRIM) 300 MG tablet TAKE 1 TABLET(300 MG) BY MOUTH DAILY 30 tablet 0  . aspirin 81 MG tablet Take 81 mg by mouth daily.    Marland Kitchen atorvastatin (LIPITOR) 40 MG tablet TAKE 1 TABLET(40 MG) BY MOUTH EVERY EVENING 90 tablet 1  . bisoprolol (ZEBETA) 5 MG tablet TAKE 1 TABLET(5 MG) BY MOUTH DAILY 30 tablet 0  . cetirizine (ZYRTEC) 10 MG tablet TAKE 1 TABLET(10 MG) BY MOUTH DAILY 30 tablet 0  . chlorpheniramine-HYDROcodone (TUSSIONEX PENNKINETIC ER) 10-8 MG/5ML SUER Take 5 mLs by mouth every 12 (twelve) hours as needed. 70 mL 0  . Cholecalciferol (VITAMIN D) 2000 UNITS tablet Take 2,000 Units by mouth daily.    . Cyanocobalamin (B-12) 2000 MCG TABS Take by mouth.    . donepezil  (ARICEPT) 10 MG tablet Take 1 tablet (10 mg total) by mouth at bedtime. 30 tablet 3  . empagliflozin (JARDIANCE) 10 MG TABS tablet Take 10 mg by mouth daily before breakfast. 30 tablet 6  . etodolac (LODINE) 400 MG tablet Take by mouth.    . fluticasone (FLONASE) 50 MCG/ACT nasal spray Place 2 sprays into both nostrils daily. 16 g 6  . folic acid (FOLVITE) 1 MG tablet Take 1 mg by mouth daily.     . furosemide (LASIX) 20 MG tablet TAKE 1 TABLET(20 MG) BY MOUTH DAILY 90 tablet 1  . meloxicam (MOBIC) 15 MG tablet Take 1 tablet by mouth daily. 90 tablet 1  . MYRBETRIQ 25 MG TB24 tablet     . nystatin ointment (MYCOSTATIN) Apply 1 application topically 2 (two) times daily. (Patient taking differently: Apply 1 application topically 2 (two) times daily as needed. ) 90 g 1  . omeprazole (PRILOSEC) 40 MG capsule TAKE 1 CAPSULE BY MOUTH 30 TO 60 MINUTES BEFORE YOUR FIRST AND LAST MEAL OF THE DAY 180 capsule 0  . potassium chloride (MICRO-K) 10 MEQ  CR capsule TAKE 1 CAPSULE BY MOUTH EVERY DAY 90 capsule 1  . sertraline (ZOLOFT) 50 MG tablet TAKE 1 TABLET(50 MG) BY MOUTH DAILY. 90 tablet 1  . telmisartan (MICARDIS) 40 MG tablet TAKE 1 TABLET(40 MG) BY MOUTH DAILY 30 tablet 11  . tiZANidine (ZANAFLEX) 4 MG tablet Take 1 tablet (4 mg total) by mouth every 6 (six) hours as needed for muscle spasms. 30 tablet 0  . triamterene-hydrochlorothiazide (MAXZIDE) 75-50 MG tablet Take 0.5 tablets by mouth every morning. Please call the office to schedule an appointment 45 tablet 1  . UNABLE TO FIND Med Name: BiPap with [employer]     No current facility-administered medications for this visit.  The patient reported he is no longer taking: Zoloft, Aricept, or Tizanadine  Family History  Problem Relation Age of Onset  . Emphysema Father   . Clotting disorder Father   . Arthritis Father   . Heart disease Mother   . Arthritis Mother   . Arthritis Maternal Grandmother   . Hypertension Maternal Grandmother   . Diabetes  Maternal Grandmother   . Arthritis Paternal Grandmother   . Diabetes Maternal Aunt   . Diabetes Paternal Aunt    There is no  family history of dementia. There is no  family history of psychiatric illness.  Psychosocial History  Developmental, Educational and Employment History: The patient grew up in [place], [place], and [place]. He lost his mother due to heart problems when he was just 74 years of age, but otherwise he had a happy childhood with no abuse or neglect. He reported that he was an adequate student who wasn't very into school but nonetheless did fine and was never held back and didn't have any learning difficulties. He went on to get an undergraduate degree at [employer], he got a masters in special education from [employer]. 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    Psychiatric History: The patient denied any significant history of psychiatric treatment or problems.   Substance Use History: The patient doesn't frequently drink alcohol, he doesn't use any drugs, and he does not smoke.   Relationship History and Living Cimcumstances: The patient has been married for 80 years, they have one biological daughter and an adopted daughter. He stated they have noticed his memory problems and are concerned.   Mental Status and Behavioral Observations  Sensorium/Arousal: The patient's level of arousal was awake and alert. Hearing and vision were adequate with correction (reading glasses) for testing purposes. Orientation: The patient was alert and fully oriented, although he was off on the specific date.   Appearance: Dressed in appropriate, casual clothing with reasonable grooming and hygiene.  Behavior: Pleasant, appropriate, appeared to be  putting forth good effort during cognitive testing Speech/language: Speech was normal in rate, rhythm, and volume with no significant word finding pauses or paraphasic errors.  Gait/Posture: Gait was not formally examined Movement: No overt signs/symptoms of movement disorder noted on observation Social Comportment: Pleasant Mood: "I'm pretty upbeat" Affect: Congruent with reported mood, mainly euthymic Thought process/content: Thought process was logical, linear, and goal directed without any loosening of associations or other signs to suggest a psychotic process. Thought content was appropriate to the topics discussed.  Safety: Any thoughts of harming  self or others were denied by the patient Insight: Fair, if anything patient is overconcerned about his memory.    Montreal Cognitive Assessment  09/28/2019 05/28/2018 09/02/2015 12/27/2014  Visuospatial/ Executive (0/5) 4 0 5 5  Naming (0/3) 3 0 3 3  Attention: Read list of digits (0/2) 1 1 2 2   Attention: Read list of letters (0/1) 1 1 1 1   Attention: Serial 7 subtraction starting at 100 (0/3) 3 2 3 3   Language: Repeat phrase (0/2) 1 2 2 2   Language : Fluency (0/1) 0 1 1 0  Abstraction (0/2) 2 2 2 2   Delayed Recall (0/5) 2 1 3 4   Orientation (0/6) 5 6 6 6   Total 22 16 28 28   Adjusted Score (based on education) 22 - 28 28    Test Procedures  Wide Range Achievement Test - 4             Word Reading Reynolds' Intellectual Screening Test Neuropsychological Assessment Battery  Memory Module  Naming  Digit Span Repeatable Battery for the Assessment of Neuropsychological Status (Form A)  Figure Copy  Judgment of Line Orientation  Coding  Figure Recall The Dot Counting Test A Random Letter Test Controlled Oral Word Association (F-A-S) Semantic Fluency (Animals) Trail Making Test A & B Complex Ideational Material Modified [place] Card Sorting Test Geriatric Depression Scale - Short Form Quick Dementia Rating System (completed  by wife, Manuela Schwartz)  Plan  Gary Butler was seen for a psychiatric diagnostic evaluation and neuropsychological testing. He is a very pleasant, 74 year old, right-hand dominant man with a history of memory problems since at least 2016 if not before. He was previously testing in the normal range in 2018 and feels his issues have been worsening over time. His MoCA score today is a 22, which is now in the mild cognitive impairment range and down from his previous all time high score of 28, which may suggest some decline. Full and complete note with impressions, recommendations, and interpretation of test data to follow.   Viviano Simas Nicole Kindred, PsyD, [place] Clinical Neuropsychologist  Informed Consent and Coding/Compliance  Risks and benefits of the evaluation were discussed with the patient prior to all testing procedures. I conducted a clinical interview and neuropsychological testing (at least two tests) with Layla Barter and Lamar Benes, B.S. (Technician) administered additional test procedures. The patient was able to tolerate the testing procedures and the patient (and/or family if applicable) is likely to benefit from further follow up to receive the diagnosis and treatment recommendations, which will be rendered at the next encounter. Billing below reflects technician time, my direct face-to-face time with the patient, time spent in test administration, and time spent in professional activities including but not limited to: neuropsychological test interpretation, integration of neuropsychological test data with clinical history, report preparation, treatment planning, care coordination, and review of diagnostically pertinent medical history or studies.   Services associated with this encounter: Clinical Interview [phone]) plus 60 minutes (36468; Neuropsychological Evaluation by Professional)  120 minutes (03212; Neuropsychological Evaluation by Professional, Adl.) 20 minutes (24825; Test Administration by  Professional) 30 minutes (00370; Neuropsychological Testing by Technician) 75 minutes (48889; Neuropsychological Testing by Technician, Adl.)

## 2019-09-28 NOTE — Progress Notes (Signed)
   Psychometrist Note   Cognitive testing was administered to Gary Butler by Lamar Benes, B.S. (Technician) under the supervision of Alphonzo Severance, Psy.D., ABN. Gary Butler was able to tolerate all test procedures. Dr. Nicole Kindred met with the patient as needed to manage any emotional reactions to the testing procedures. Rest breaks were offered.    The battery of tests administered was selected by Dr. Nicole Kindred with consideration to the patient's current level of functioning, the nature of his symptoms, emotional and behavioral responses during the interview, level of literacy, observed level of motivation/effort, and the nature of the referral question. This battery was communicated to the psychometrist. Communication between Dr. Nicole Kindred and the psychometrist was ongoing throughout the evaluation and Dr. Nicole Kindred was immediately accessible at all times. Dr. Nicole Kindred provided supervision to the technician on the date of this service, to the extent necessary to assure the quality of all services provided.    Gary Butler will return in approximately one week for an interactive feedback session with Dr. Nicole Kindred, at which time test performance, clinical impressions, and treatment recommendations will be reviewed in detail. The patient understands he can contact our office should he require our assistance before this time.   A total of 105 minutes of billable time were spent with Gary Butler by the technician, including test administration and scoring time. Billing for these services is reflected in Dr. Les Pou note.   This note reflects time spent with the psychometrician and does not include test scores, clinical history, or any interpretations made by Dr. Nicole Kindred. The full report will follow in a separate note.

## 2019-09-29 NOTE — Progress Notes (Signed)
[facility]  Patient Name: Gary Butler MRN: [id number] Date of Birth: [birth year] Age: 74 y.o. Education: 18 years  Measurement properties of test scores: IQ, Index, and Standard Scores (SS): Mean = 100; Standard Deviation = 15 Scaled Scores (Ss): Mean = 10; Standard Deviation = 3 Z scores (Z): Mean = 0; Standard Deviation = 1 T scores (T); Mean = 50; Standard Deviation = 10  TEST SCORES:    Note: This summary of test scores accompanies the interpretive report and should not be interpreted by unqualified individuals or in isolation without reference to the report. Test scores are relative to age, gender, and educational history as available and appropriate.   Performance Validity        "A" Random Letter Test Raw  Descriptor      Errors 0 Within Expectation  The Dot Counting Test: 13 Within Expectation      Mental Status Screening     Total Score Descriptor  MoCA 22 MCI      Expected Functioning        Wide Range Achievement Test: Standard/Scaled Score Percentile      Word Reading 99 47      Reynolds Intellectual Screening Test Standard/T-score Percentile      Guess What 55 69      Odd Item Out 60 84  RIST Index 114 82      Attention/Processing Speed        Neuropsychological Assessment Battery (Attention Module, Form 1): Scaled/T-score Percentile     Digits Forward 40 16     Digits Backwards 33 5      Repeatable Battery for the Assessment of Neuropsychological Status (Form A): Standard Score Percentile     Coding 5 5      Language        Neuropsychological Assessment Battery (Language Module, Form 1): T-score Percentile      Naming   (31) 57 75      Verbal Fluency:  T Score Percentile      Controlled Oral Word Association (F-A-S) 32 4      Semantic Fluency (Animals) 23 <1      Memory:        Neuropsychological Assessment Battery (Memory Module, Form 1): T-score/Standard Score Percentile  Memory Index (MEM): 79 8       List Learning           List A Immediate Recall   (4 , 6 , 7) 38 12         List B Immediate Recall   (4) 48 42         List A Short Delayed Recall   (4) 34 5         List A Long Delayed Recall   (0) 23 <1         List A Percent Retention   (0 %) --- <1         List A Long Delayed Yes/No Recognition Hits   (11) --- 76         List A Long Delayed Yes/No Recognition False Alarms   (5) --- 42         List A Recognition Discriminability Index --- 42      Shape Learning           Immediate Recognition   (4 , 6 , 5) 49 46         Delayed Recognition   (5) 46 34  Percent Retention   (83 %) --- 31         Delayed Forced-Choice Recognition Hits   (9) --- 84         Delayed Forced-Choice Recognition False Alarms   (0) --- 75         Delayed Forced-Choice Recognition Discriminability --- 88     Story Learning           Immediate Recall   (31, 33) 53 62         Delayed Recall   (24) 37 9         Percent Retention   (73 %) --- 21      Daily Living Memory            Immediate Recall   (22, 16) 42 21          Delayed Recall   (8, 3) 39 14          Percent Retention (79 %) --- 34          Recognition Hits   (8) --- 27      Repeatable Battery for the Assessment of Neuropsychological Status (Form A): Scaled Score Percentile         Figure Recall   (8) 7 16      Visuospatial/Constructional Functioning        Repeatable Battery for the Assessment of Neuropsychological Status (Form A): Standard/Scaled Score Percentile      Visuospatial/Constructional Index 102 55         Figure Copy   (19) 12 75         Judgment of Line Orientation   (16) --- 26-50      Executive Functioning        Modified [employer] Test (MWCST): Standard/T-Score Percentile      Number of Categories Correct 37 9      Number of Perseverative Errors 46 34      Number of Total Errors 41 18      Percent Perseverative Errors 54 66  Executive Function Composite 86 18      Trail Making Test: T-Score Percentile        Part A 33 5      Part B 31 3      Boston Diagnostic Aphasia Exam: Raw Score Scaled Score      Complex Ideational Material 10 7      Clock Drawing Raw Score Descriptor      Command 10 WNL      Rating Scales        Clinical Dementia Rating Raw Score Descriptor      Sum of Boxes 2.5 Very Mild Dementia      Global Score 0.5 MCI      Quick Dementia Rating System Raw Score Descriptor      Sum of Boxes 5.5 Mild Dementia      Total Score 9 Mild Dementia  Geriatric Depression Scale - Short Form 6 Positive   Jaquelinne Glendening V. Nicole Kindred PsyD, [place] Clinical Neuropsychologist

## 2019-10-02 NOTE — Progress Notes (Signed)
[facility]  Patient Name: Gary Butler MRN: [id number] Date of Birth: [birth year] Age: 74 y.o. Education: 75 years  Clinical Impressions and Recommendations  Orazio Weller is a 74 y.o., right-hand dominant, married man with a history of cognitive complains since approximately 2014 but normal neuropsychological testing in 2018. He and his wife feel as though he has been declining, over time, particularly in the past year. His MoCA at today's visit is 22/30, which is down from his previous all time high of 28/30 in 2017 and now in the MCI range. He had an MRI in 2016 that showed a moderate burden of leukoaraiosis but the images were not available for review.   On neuropsychological testing, his performance is less than expected on memory measures with overall performance in the unusually low range. The pattern of finding shows diminished encoding and delayed free recall for unstructured verbal information in the setting of relatively better recognition performance and encoding/retrieval of structured verbal information and visual information. Low scores were also obtained on measures of processing speed, working memory, verbal fluency, and select measures of executive function. Naming and visuospatial functioning were well preserved. He screened positive for the presence of depression but denies subjective depressed mood so that may be a false positive. His wife rated him as functioning at a dementia level but from his test performance and report, I think that is an overestimate and would place his CDR more in the MCI range.   Overall impression is that of primarily subcortical cognitive dysfunction, amounting to a mild cognitive impairment level problem. While the possibility of a separate underlying condition cannot be entirely ruled out I favor vascular disease as a primary etiology. I will clarify with the patient and his wife the level of real-world dysfunction he is  experiencing, because there is quite a discrepancy between his wife's report and his presentation and performance. Could consider starting a cholinesterase inhibitor if desired by the patient. I see no reason from a testing perspective why he is clearly unsafe to drive but will follow up with him and his wife as there is some mention of him having a hard time with directions. He would likely benefit from implementing preventative health behaviors, including maintaining a healthy weight, eating a heart-healthy/brain-healthy diet such as MIND DASH diet, and assertively treating risk factors for cerebrovascular disease. I do not think he is frankly depressed but will also discuss that with him because he might benefit from an antidepressant if he is having mood problems. He would also be a good candidate for psychotherapy.   Diagnostic Impressions: Mild Cognitive Impairment  Test Findings  Test scores are summarized in additional documentation associated with this encounter. Test scores are relative to age, gender, and educational history as available and appropriate. There were no concerns about performance validity as all findings fell within normal expectations.   General Intellectual Functioning/Achievement:  Performance on single word reading was average. By contrast, he demonstrated high average performance on the RIST index with an average score on the verbally mediated portion of the task and a high average score on the more visually oriented portion. High average presents as the best standard of comparison for his cognitive test performance given the more robust nature of this measure and his educational and occupational achievement.    Attention and Processing Efficiency: Performance on measures of attention suggested difficulties with working memory with unusually low digit repetition backward. Digit repetition forward was low average.   With respect to  processing efficiency, timed  number-symbol coding was unusually low.   Language: Performance on the fundamental ability of visual object confrontation naming was entirely normal and errorless. By contrast, he had difficulties with verbal fluency measures, with unusually low phonemic and extremely low semantic fluency.   Visuospatial Function: Visuospatial and constructional function was well preserved with an average score on the overall index. Copy of a modestly complex figure was average as was judgment of angular line orientations.   Learning and Memory: Performance on measures of learning and memory fell below expectations for Mr. Huttner, with the discrepancy between his overall ability level and memory index performance expected in less than one percent of healthy individuals at his ability level. The number of low subtest scores is also unusual. The profile shows difficulties with encoding and delayed free recall of unstructured verbal information with relatively preserved recognition memory and better memory for structured information and visual information. This is often the case with so-called executive profiles, where diminishment in executive capacities attentuates memory performance.   In the verbal realm, he recalled 4, 6, and 7 words of a 12-item word list across three learning trials, which is at the margin of the low average and unusually low ranges for his age and education cohort. He recalled 4 of these words on short delayed recall, which is unusually low, and 0 words on long delayed recall, which is extremely low. He did better when provided with recognition cues, however, achieving a low average score. Immediate recall for structured verbal information in the form of a short story with average immediate recall and low average delayed recall. Memory for brief daily-living type information was low average both on immediate and delayed recall.   In the visual realm, immediate and delayed recognition of a series of  designs that are difficult to verbally encode were average. Identifying the designs under a forced-choice recognition format was average. Delayed recall for a modestly complex figure was low average.   Executive Functions: Mr. Minder demonstrated performance across the test battery broadly implicating frontal-subcortical systems with diminished working memory, encoding and retrieval of unstructured as compared to structured information, and diminished processing speed. On dedicated measures he had sporadic low scores with unusually low alternating sequencing of numbers and letters of the alphabet and marginal verbal fluency performance. He achieved a low average score on the Executive Function Composite of the Modified [employer], with an unusually low categories completed score and relatively better perseverative errors score. This suggests he was not successful at the expected level when discerning the underling sorting principles but he was not overly rigid or resistant to feedback in attempting to do so.   Rating Scale(s): Mr. Skog screened positive for the presence of depression, although many of his issues may be related to age and physical limitations/cognitive limitations as opposed to bona-fide depression. His wife reported that he is "terrified" about his cognitive problems. She characterized him as functioning at a mild dementia level but I think that is an overestimate and likely, the best severity is MCI at this point in time as per his CDR.   Viviano Simas Nicole Kindred PsyD, [facility] Clinical Neuropsychologist

## 2019-10-05 ENCOUNTER — Other Ambulatory Visit: Payer: Self-pay

## 2019-10-05 ENCOUNTER — Telehealth: Payer: Self-pay | Admitting: Family Medicine

## 2019-10-05 ENCOUNTER — Encounter: Payer: Self-pay | Admitting: Counselor

## 2019-10-05 ENCOUNTER — Ambulatory Visit (INDEPENDENT_AMBULATORY_CARE_PROVIDER_SITE_OTHER): Payer: Medicare PPO | Admitting: Counselor

## 2019-10-05 DIAGNOSIS — G3184 Mild cognitive impairment, so stated: Secondary | ICD-10-CM | POA: Diagnosis not present

## 2019-10-05 DIAGNOSIS — I679 Cerebrovascular disease, unspecified: Secondary | ICD-10-CM

## 2019-10-05 DIAGNOSIS — F0390 Unspecified dementia without behavioral disturbance: Secondary | ICD-10-CM

## 2019-10-05 NOTE — Progress Notes (Signed)
 NEUROPSYCHOLOGY FEEDBACK NOTE [facility]  Telemedicine statement:  I discussed the limitations of neuropsychological care via telemedicine and the availability of in person appointments. The patient expressed understanding and agreed to proceed. The patient was verified with two identifiers.  The visit modality was: in person The patient location was: clinic The provider location was: office  The following individuals participated: Smitty Ritsema Susan Stanislaw  Feedback Note: I met with Gary Butler to review the findings resulting from his neuropsychological evaluation. Since the last appointment, he has been about the same. I took the time to gather some additional history, as his wife reported much more in the way of problems on the QDRS than I expected given his report of symptoms. She said that she was trying to be "generous," and that really difficulties with directions when driving is the only major functional difficulty she has noticed. Time was spent reviewing the impressions and recommendations that are detailed in the evaluation report. We discussed impression of some decline, amounting to an MCI level problem. His neuropsychological test performance is most consistent with frontal-subcortical type issues and he had moderate areas of leukoaraiosis on his MRI in 2016, making vascular disease the most likely etiology. I explained that at his age there is always the risk of developing Alzheimer's and that this may also be a factor, but his preserved insight and memory storage are unexpected if that were the primary cause of his problem. Updated MRI of the brain would be helpful. I took time to explain the findings and answer all the patient's questions. I encouraged [name] to contact me should he have any further questions or if further follow up is desired.   Current Medications and Medical History   Current Outpatient Medications  Medication Sig Dispense Refill  . Albuterol Sulfate  (PROAIR RESPICLICK) 108 (90 Base) MCG/ACT AEPB Inhale 2 puffs into the lungs every 6 (six) hours as needed (for wheezing or shortness of breath). 1 each 3  . allopurinol (ZYLOPRIM) 300 MG tablet TAKE 1 TABLET(300 MG) BY MOUTH DAILY 30 tablet 0  . aspirin 81 MG tablet Take 81 mg by mouth daily.    . atorvastatin (LIPITOR) 40 MG tablet TAKE 1 TABLET(40 MG) BY MOUTH EVERY EVENING 90 tablet 1  . bisoprolol (ZEBETA) 5 MG tablet TAKE 1 TABLET(5 MG) BY MOUTH DAILY 30 tablet 0  . cetirizine (ZYRTEC) 10 MG tablet TAKE 1 TABLET(10 MG) BY MOUTH DAILY 30 tablet 0  . chlorpheniramine-HYDROcodone (TUSSIONEX PENNKINETIC ER) 10-8 MG/5ML SUER Take 5 mLs by mouth every 12 (twelve) hours as needed. 70 mL 0  . Cholecalciferol (VITAMIN D) 2000 UNITS tablet Take 2,000 Units by mouth daily.    . Cyanocobalamin (B-12) 2000 MCG TABS Take by mouth.    . donepezil (ARICEPT) 10 MG tablet Take 1 tablet (10 mg total) by mouth at bedtime. 30 tablet 3  . empagliflozin (JARDIANCE) 10 MG TABS tablet Take 10 mg by mouth daily before breakfast. 30 tablet 6  . etodolac (LODINE) 400 MG tablet Take by mouth.    . fluticasone (FLONASE) 50 MCG/ACT nasal spray Place 2 sprays into both nostrils daily. 16 g 6  . folic acid (FOLVITE) 1 MG tablet Take 1 mg by mouth daily.     . furosemide (LASIX) 20 MG tablet TAKE 1 TABLET(20 MG) BY MOUTH DAILY 90 tablet 1  . meloxicam (MOBIC) 15 MG tablet Take 1 tablet by mouth daily. 90 tablet 1  . MYRBETRIQ 25 MG TB24 tablet     .   nystatin ointment (MYCOSTATIN) Apply 1 application topically 2 (two) times daily. (Patient taking differently: Apply 1 application topically 2 (two) times daily as needed. ) 90 g 1  . omeprazole (PRILOSEC) 40 MG capsule TAKE 1 CAPSULE BY MOUTH 30 TO 60 MINUTES BEFORE YOUR FIRST AND LAST MEAL OF THE DAY 180 capsule 0  . potassium chloride (MICRO-K) 10 MEQ CR capsule TAKE 1 CAPSULE BY MOUTH EVERY DAY 90 capsule 1  . sertraline (ZOLOFT) 50 MG tablet TAKE 1 TABLET(50 MG) BY MOUTH  DAILY. 90 tablet 1  . telmisartan (MICARDIS) 40 MG tablet TAKE 1 TABLET(40 MG) BY MOUTH DAILY 30 tablet 11  . tiZANidine (ZANAFLEX) 4 MG tablet Take 1 tablet (4 mg total) by mouth every 6 (six) hours as needed for muscle spasms. 30 tablet 0  . triamterene-hydrochlorothiazide (MAXZIDE) 75-50 MG tablet Take 0.5 tablets by mouth every morning. Please call the office to schedule an appointment 45 tablet 1  . UNABLE TO FIND Med Name: BiPap with [employer]     No current facility-administered medications for this visit.   Patient Active Problem List   Diagnosis Date Noted  . Dyspnea on exertion 02/19/2017  . Bilateral lower extremity edema 07/06/2015  . CHF (congestive heart failure) (HCC) 06/27/2015  . Fungal dermatitis 06/27/2015  . Edema 06/20/2015  . Increasing shortness of breath 06/20/2015  . Lumbar radiculopathy, acute 03/09/2015  . Essential hypertension 12/27/2014  . Type 2 diabetes mellitus with diabetic neuropathy, unspecified (HCC) 12/27/2014  . Mild aortic stenosis 06/28/2014  . Memory loss 04/29/2014  . Tinnitus of both ears 02/17/2014  . Wellness examination 12/04/2013  . Constipation due to slow transit 08/18/2013  . Morbid obesity due to excess calories (HCC) 06/23/2013  . S/P left TKA 06/22/2013  . Meniere disease 04/21/2013  . Benign paroxysmal positional vertigo 04/21/2013  . Psoriatic arthritis (HCC) 05/14/2012  . General medical exam 01/28/2012  . Cellulitis of eyelid 09/25/2011  . Diverticulitis 06/20/2011  . Erythrocytosis 05/18/2011  . S/P TKR (total knee replacement) 12/31/2010  . Obesity 12/31/2010  . Hyperlipidemia 12/22/2010  . BPH (benign prostatic hyperplasia) 12/22/2010  . Gout 12/22/2010  . Obstructive sleep apnea 04/04/2009   Mental Status and Behavioral Observations  [name] presented on time to this appointment and was alert and generally oriented (orientation not formally assessed). Speech was normal in rate, rhythm, volume, and prosody.  Self-reported mood was "fine" and affect as assessed by vocal quality was euthymic. Thought process was logical, linear, and goal-oriented and thought content was appropriate to the topics discussed. There were no safety concerns identified at today's encounter, such as thoughts of harming self or others.   Plan  Feedback provided regarding the patient's neuropsychological evaluation. 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 I recommended that he obtain updated MRI of the brain, which he would like to coordinate through his PCPs office. I am happy to follow up with him after that study and instructed them to call to schedule an appointment if desired. Duvid Passon was encouraged to contact me if any questions arise or if further follow up is desired.    V. , PsyD, ABN Clinical Neuropsychologist  Service(s) Provided at This Encounter: 39 minutes ([id number]; Conjoint therapy with patient present)   

## 2019-10-05 NOTE — Telephone Encounter (Signed)
[facility] for MRI brain w/o contrast- dx dementia

## 2019-10-05 NOTE — Patient Instructions (Addendum)
As we discussed, your performance and presentation on assessment today was consistent with a mild level of cognitive difficulty that can broadly be described as "frontal-subcortical." This means difficulties with cognitive efficiency, thinks like problem solving, memory encoding, and the like. You are still functioning relatively well and therefore, the best diagnosis at this point in time is Mild Cognitive Impairment.   The major difference between mild cognitive impairment (MCI) and dementia is in severity and potential prognosis. Once someone reaches a level of severity adequate to be diagnosed with a dementia, there is usually progression over time, though this may be years. On the other hand, mild cognitive impairment, while a significant risk for dementia in future, does not always progress to dementia, and in some instances stays the same or can even revert to normal. It is important to realize that if MCI is due to underlying Alzheimer's disease, it will most likely progress to dementia eventually. The rate of conversion to Alzheimer's dementia from amnestic MCI is about 15% per year versus the general population risk of conversion of 2% per year.   We discussed that I do think your MCI is probably due to vascular disease, although Alzheimer's disease is always a concern in an individual your age. We discussed the importance of managing risk factors including blood pressure, cholesterol, and blood sugar.   There is now good quality evidence from at least one large scale study that a modified mediterranean diet may help slow cognitive decline. This is known as the "MIND" diet. The Mind diet is not so much a specific diet as it is a set of recommendations for things that you should and should not eat.   Foods that are ENCOURAGED on the MIND Diet:  Green, leafy vegetables: Aim for six or more servings per week. This includes kale, spinach, cooked greens and salads.  All other vegetables: Try to eat  another vegetable in addition to the green leafy vegetables at least once a day. It is best to choose non-starchy vegetables because they have a lot of nutrients with a low number of calories.  Berries: Eat berries at least twice a week. There is a plethora of research on strawberries, and other berries such as blueberries, raspberries and blackberries have also been found to have antioxidant and brain health benefits.  Nuts: Try to get five servings of nuts or more each week. The creators of the Letts don't specify what kind of nuts to consume, but it is probably best to vary the type of nuts you eat to obtain a variety of nutrients. Peanuts are a legume and do not fall into this category.  Olive oil: Use olive oil as your main cooking oil. There may be other heart-healthy alternatives such as algae oil, though there is not yet sufficient research upon which to base a formal recommendation.  Whole grains: Aim for at least three servings daily. Choose minimally processed grains like oatmeal, quinoa, brown rice, whole-wheat pasta and 100% whole-wheat bread.  Fish: Eat fish at least once a week. It is best to choose fatty fish like salmon, sardines, trout, tuna and mackerel for their high amounts of omega-3 fatty acids.  Beans: Include beans in at least four meals every week. This includes all beans, lentils and soybeans.  Poultry: Try to eat chicken or [place] at least twice a week. Note that fried chicken is not encouraged on the MIND diet.  Wine: Aim for no more than one glass of alcohol daily. Both red  and white wine may benefit the brain. However, much research has focused on the red wine compound resveratrol, which may help protect against Alzheimer's disease.  Foods that are DISCOURAGED on the MIND Diet: Butter and margarine: Try to eat less than 1 tablespoon (about 14 grams) daily. Instead, try using olive oil as your primary cooking fat, and dipping your bread in olive oil with herbs.  Cheese:  The MIND diet recommends limiting your cheese consumption to less than once per week.  Red meat: Aim for no more than three servings each week. This includes all beef, pork, lamb and products made from these meats.  [place] food: The MIND diet highly discourages fried food, especially the kind from fast-food restaurants. Limit your consumption to less than once per week.  Pastries and sweets: This includes most of the processed junk food and desserts you can think of. Ice cream, cookies, brownies, snack cakes, donuts, candy and more. Try to limit these to no more than four times a week.  Exercise is one of the best medicines for promoting health and maintaining cognitive fitness at all stages in life. Exercise probably has the largest documented effect on brain health and performance of any lifestyle intervention. Studies have shown that even previously sedentary individuals who start exercising as late as age 53 show a significant survival benefit as compared to their non-exercising peers. In the [place], the current guidelines are for 30 minutes of moderate exercise per day, but increasing your activity level less than that may also be helpful. You do not have to get your 30 minutes of exercise in one shot and exercising for short periods of time spread throughout the day can be helpful. Go for several walks, learn to dance, or do something else you enjoy that gets your body moving. Of course, if you have an underlying medical condition or there is any question about whether it is safe for you to exercise, you should consult a medical treatment provider prior to beginning exercise.   We discussed your driving and at this point, you and your wife do not think there is a safety issue. I encouraged you to monitor that closely.   We discussed that getting an updated MRI with actual images would allow [place] to have more confidence that this is due to cerebrovascular disease. You planned to follow up with Dr.  Birdie Riddle about that. I am happy to see you for a follow up after that study is obtained.

## 2019-10-05 NOTE — Telephone Encounter (Signed)
Pt called in stating that he was seen at the neurologist office today. They want him to have a MRI of the brain, they told him this has be to ordered by his pcp   Please advise

## 2019-10-05 NOTE — Telephone Encounter (Signed)
Per the notes from Neuropsych appt today. It is noted pt would like to coordinate the MRI through our office.

## 2019-10-05 NOTE — Telephone Encounter (Signed)
Order placed

## 2019-10-11 DIAGNOSIS — L03011 Cellulitis of right finger: Secondary | ICD-10-CM | POA: Diagnosis not present

## 2019-10-13 ENCOUNTER — Telehealth (INDEPENDENT_AMBULATORY_CARE_PROVIDER_SITE_OTHER): Payer: Medicare PPO | Admitting: Family Medicine

## 2019-10-13 ENCOUNTER — Encounter: Payer: Self-pay | Admitting: Family Medicine

## 2019-10-13 ENCOUNTER — Other Ambulatory Visit: Payer: Self-pay

## 2019-10-13 VITALS — Ht 71.5 in | Wt 303.0 lb

## 2019-10-13 DIAGNOSIS — R6881 Early satiety: Secondary | ICD-10-CM | POA: Diagnosis not present

## 2019-10-13 DIAGNOSIS — R634 Abnormal weight loss: Secondary | ICD-10-CM

## 2019-10-13 DIAGNOSIS — R197 Diarrhea, unspecified: Secondary | ICD-10-CM

## 2019-10-13 NOTE — Progress Notes (Signed)
Virtual Visit via Video   I connected with patient on 10/13/19 at  7:30 AM EDT by a video enabled telemedicine application and verified that I am speaking with the correct person using two identifiers.  Location patient: Home Location provider: [facility]up[facility], Office Persons participating in the virtual visit: Patient, Provider, [place] (Jess B)  I discussed the limitations of evaluation and management by telemedicine and the availability of in person appointments. The patient expressed understanding and agreed to proceed.  Subjective:   HPI:   Diarrhea- pt reports sxs started 6 weeks ago.  Diarrhea is 'explosive'.  Having 3-4 episodes daily.  No blood in stool.  + foul smelling.  Intermittent abd pain- cramping.  No N/V.  No one else at home w/ similar sxs.  No recent abx use.  Sees Dr Earlean Shawl (GI)  Unexplained weight loss- pt is down 60 lbs since January w/o exercise or change in diet.  He reports he is unable to eat more than a few bites of any meal before he is full.  No fevers or night sweats.  ROS:   See pertinent positives and negatives per HPI.  Patient Active Problem List   Diagnosis Date Noted  . Dyspnea on exertion 02/19/2017  . Bilateral lower extremity edema 07/06/2015  . CHF (congestive heart failure) ([place]) 06/27/2015  . Fungal dermatitis 06/27/2015  . Edema 06/20/2015  . Increasing shortness of breath 06/20/2015  . Lumbar radiculopathy, acute 03/09/2015  . Essential hypertension 12/27/2014  . Type 2 diabetes mellitus with diabetic neuropathy, unspecified ([place]) 12/27/2014  . Mild aortic stenosis 06/28/2014  . Memory loss 04/29/2014  . Tinnitus of both ears 02/17/2014  . Wellness examination 12/04/2013  . Constipation due to slow transit 08/18/2013  . Morbid obesity due to excess calories ([place]) 06/23/2013  . S/P left TKA 06/22/2013  . Meniere disease 04/21/2013  . Benign paroxysmal positional vertigo 04/21/2013  . Psoriatic arthritis ([place]) 05/14/2012  .  General medical exam 01/28/2012  . Cellulitis of eyelid 09/25/2011  . Diverticulitis 06/20/2011  . Erythrocytosis 05/18/2011  . S/P TKR (total knee replacement) 12/31/2010  . Obesity 12/31/2010  . Hyperlipidemia 12/22/2010  . BPH (benign prostatic hyperplasia) 12/22/2010  . Gout 12/22/2010  . Obstructive sleep apnea 04/04/2009    Social History   Tobacco Use  . Smoking status: Never Smoker  . Smokeless tobacco: Never Used  Substance Use Topics  . Alcohol use: Yes    Alcohol/week: 0.0 standard drinks    Comment: Once a month    Current Outpatient Medications:  .  Albuterol Sulfate (PROAIR RESPICLICK) 381 (90 Base) MCG/ACT AEPB, Inhale 2 puffs into the lungs every 6 (six) hours as needed (for wheezing or shortness of breath)., Disp: 1 each, Rfl: 3 .  allopurinol (ZYLOPRIM) 300 MG tablet, TAKE 1 TABLET(300 MG) BY MOUTH DAILY, Disp: 30 tablet, Rfl: 0 .  aspirin 81 MG tablet, Take 81 mg by mouth daily., Disp: , Rfl:  .  atorvastatin (LIPITOR) 40 MG tablet, TAKE 1 TABLET(40 MG) BY MOUTH EVERY EVENING, Disp: 90 tablet, Rfl: 1 .  bisoprolol (ZEBETA) 5 MG tablet, TAKE 1 TABLET(5 MG) BY MOUTH DAILY, Disp: 30 tablet, Rfl: 0 .  cetirizine (ZYRTEC) 10 MG tablet, TAKE 1 TABLET(10 MG) BY MOUTH DAILY, Disp: 30 tablet, Rfl: 0 .  Cholecalciferol (VITAMIN D) 2000 UNITS tablet, Take 2,000 Units by mouth daily., Disp: , Rfl:  .  Cyanocobalamin (B-12) 2000 MCG TABS, Take by mouth., Disp: , Rfl:  .  donepezil (ARICEPT)  10 MG tablet, Take 1 tablet (10 mg total) by mouth at bedtime., Disp: 30 tablet, Rfl: 3 .  empagliflozin (JARDIANCE) 10 MG TABS tablet, Take 10 mg by mouth daily before breakfast., Disp: 30 tablet, Rfl: 6 .  etodolac (LODINE) 400 MG tablet, Take by mouth., Disp: , Rfl:  .  fluocinonide cream (LIDEX) 0.05 %, , Disp: , Rfl:  .  fluticasone (FLONASE) 50 MCG/ACT nasal spray, Place 2 sprays into both nostrils daily., Disp: 16 g, Rfl: 6 .  folic acid (FOLVITE) 1 MG tablet, Take 1 mg by mouth  daily. , Disp: , Rfl:  .  furosemide (LASIX) 20 MG tablet, TAKE 1 TABLET(20 MG) BY MOUTH DAILY, Disp: 90 tablet, Rfl: 1 .  meloxicam (MOBIC) 15 MG tablet, Take 1 tablet by mouth daily., Disp: 90 tablet, Rfl: 1 .  MYRBETRIQ 25 MG TB24 tablet, , Disp: , Rfl:  .  nystatin ointment (MYCOSTATIN), Apply 1 application topically 2 (two) times daily. (Patient taking differently: Apply 1 application topically 2 (two) times daily as needed. ), Disp: 90 g, Rfl: 1 .  omeprazole (PRILOSEC) 40 MG capsule, TAKE 1 CAPSULE BY MOUTH 30 TO 60 MINUTES BEFORE YOUR FIRST AND LAST MEAL OF THE DAY, Disp: 180 capsule, Rfl: 0 .  potassium chloride (MICRO-K) 10 MEQ CR capsule, TAKE 1 CAPSULE BY MOUTH EVERY DAY, Disp: 90 capsule, Rfl: 1 .  sertraline (ZOLOFT) 50 MG tablet, TAKE 1 TABLET(50 MG) BY MOUTH DAILY., Disp: 90 tablet, Rfl: 1 .  telmisartan (MICARDIS) 40 MG tablet, TAKE 1 TABLET(40 MG) BY MOUTH DAILY, Disp: 30 tablet, Rfl: 11 .  tiZANidine (ZANAFLEX) 4 MG tablet, Take 1 tablet (4 mg total) by mouth every 6 (six) hours as needed for muscle spasms., Disp: 30 tablet, Rfl: 0 .  triamterene-hydrochlorothiazide (MAXZIDE) 75-50 MG tablet, Take 0.5 tablets by mouth every morning. Please call the office to schedule an appointment, Disp: 45 tablet, Rfl: 1 .  UNABLE TO FIND, Med Name: BiPap with [facility], Disp: , Rfl:   Allergies  Allergen Reactions  . Lyrica [Pregabalin] Swelling  . Penicillins Swelling  . Levofloxacin Rash    Objective:   Ht 5' 11.5" (1.816 m)   Wt (!) 303 lb (137.4 kg)   BMI 41.67 kg/m   AAOx3, NAD NCAT, EOMI No obvious CN deficits Coloring WNL Pt is able to speak clearly, coherently without shortness of breath or increased work of breathing.  Thought process is linear.  Mood is appropriate.   Assessment and Plan:   Diarrhea- pt reports 6 weeks of explosive diarrhea.  He saw GI for diarrhea in July but no labs or stool studies were done at that time.  He reports foul odor w/ explosive watery  stools.  Check C diff, stool culture.  Encouraged increased fluid intake  Unexplained weight loss- pt is down 60 lbs since January.  He reports this is b/c he can only eat small amounts of any meal before he is full.  Denies fevers, chills, night sweats.  Check labs.  May need imaging in the near future.  Early Satiety- likely the cause for his unexplained weight loss.  Check Ca19-9 as well as other labs to look for metabolic cause.  If no lab cause identified may need CT abd/pelvis to assess for functional issue.  Pt expressed understanding and is in agreement w/ plan.   Annye Asa, MD 10/13/2019

## 2019-10-13 NOTE — Progress Notes (Signed)
I have discussed the procedure for the virtual visit with the patient who has given consent to proceed with assessment and treatment.   Larren Copes L Mahin Guardia, CMA     

## 2019-10-15 DIAGNOSIS — Z23 Encounter for immunization: Secondary | ICD-10-CM | POA: Diagnosis not present

## 2019-10-15 DIAGNOSIS — L409 Psoriasis, unspecified: Secondary | ICD-10-CM | POA: Diagnosis not present

## 2019-10-21 ENCOUNTER — Ambulatory Visit: Payer: Medicare PPO

## 2019-10-21 ENCOUNTER — Telehealth: Payer: Self-pay | Admitting: Family Medicine

## 2019-10-21 NOTE — Telephone Encounter (Signed)
Lm asking pt to call back to schedule a lab appt.

## 2019-10-22 ENCOUNTER — Other Ambulatory Visit: Payer: Self-pay | Admitting: Internal Medicine

## 2019-10-26 ENCOUNTER — Ambulatory Visit (INDEPENDENT_AMBULATORY_CARE_PROVIDER_SITE_OTHER): Payer: Medicare PPO

## 2019-10-26 ENCOUNTER — Other Ambulatory Visit: Payer: Self-pay

## 2019-10-26 DIAGNOSIS — R634 Abnormal weight loss: Secondary | ICD-10-CM

## 2019-10-26 DIAGNOSIS — R197 Diarrhea, unspecified: Secondary | ICD-10-CM | POA: Diagnosis not present

## 2019-10-26 DIAGNOSIS — R6881 Early satiety: Secondary | ICD-10-CM | POA: Diagnosis not present

## 2019-10-26 DIAGNOSIS — Z20828 Contact with and (suspected) exposure to other viral communicable diseases: Secondary | ICD-10-CM | POA: Diagnosis not present

## 2019-10-26 LAB — HEPATIC FUNCTION PANEL
ALT: 12 U/L (ref 0–53)
AST: 13 U/L (ref 0–37)
Albumin: 3.6 g/dL (ref 3.5–5.2)
Alkaline Phosphatase: 100 U/L (ref 39–117)
Bilirubin, Direct: 0.4 mg/dL — ABNORMAL HIGH (ref 0.0–0.3)
Total Bilirubin: 2 mg/dL — ABNORMAL HIGH (ref 0.2–1.2)
Total Protein: 5.8 g/dL — ABNORMAL LOW (ref 6.0–8.3)

## 2019-10-26 LAB — CBC WITH DIFFERENTIAL/PLATELET
Basophils Absolute: 0.1 10*3/uL (ref 0.0–0.1)
Basophils Relative: 1.3 % (ref 0.0–3.0)
Eosinophils Absolute: 0.2 10*3/uL (ref 0.0–0.7)
Eosinophils Relative: 2.3 % (ref 0.0–5.0)
HCT: 47.3 % (ref 39.0–52.0)
Hemoglobin: 15.8 g/dL (ref 13.0–17.0)
Lymphocytes Relative: 13 % (ref 12.0–46.0)
Lymphs Abs: 0.9 10*3/uL (ref 0.7–4.0)
MCHC: 33.4 g/dL (ref 30.0–36.0)
MCV: 94.4 fl (ref 78.0–100.0)
Monocytes Absolute: 0.5 10*3/uL (ref 0.1–1.0)
Monocytes Relative: 7.3 % (ref 3.0–12.0)
Neutro Abs: 5.3 10*3/uL (ref 1.4–7.7)
Neutrophils Relative %: 76.1 % (ref 43.0–77.0)
Platelets: 222 10*3/uL (ref 150.0–400.0)
RBC: 5.01 Mil/uL (ref 4.22–5.81)
RDW: 16.7 % — ABNORMAL HIGH (ref 11.5–15.5)
WBC: 6.9 10*3/uL (ref 4.0–10.5)

## 2019-10-26 LAB — BASIC METABOLIC PANEL
BUN: 22 mg/dL (ref 6–23)
CO2: 30 mEq/L (ref 19–32)
Calcium: 9.2 mg/dL (ref 8.4–10.5)
Chloride: 94 mEq/L — ABNORMAL LOW (ref 96–112)
Creatinine, Ser: 1.26 mg/dL (ref 0.40–1.50)
GFR: 55.8 mL/min — ABNORMAL LOW (ref >60.00)
Glucose, Bld: 210 mg/dL — ABNORMAL HIGH (ref 70–99)
Potassium: 3.6 mEq/L (ref 3.5–5.1)
Sodium: 135 mEq/L (ref 135–145)

## 2019-10-26 LAB — HEMOGLOBIN A1C: Hgb A1c MFr Bld: 9.1 % — ABNORMAL HIGH (ref 4.6–6.5)

## 2019-10-26 LAB — TSH: TSH: 3.43 u[IU]/mL (ref 0.35–4.50)

## 2019-10-27 ENCOUNTER — Other Ambulatory Visit: Payer: Self-pay | Admitting: General Practice

## 2019-10-27 DIAGNOSIS — R197 Diarrhea, unspecified: Secondary | ICD-10-CM | POA: Diagnosis not present

## 2019-10-27 DIAGNOSIS — E1140 Type 2 diabetes mellitus with diabetic neuropathy, unspecified: Secondary | ICD-10-CM

## 2019-10-27 LAB — CANCER ANTIGEN 19-9: CA 19-9: 7 U/mL (ref <34)

## 2019-10-28 LAB — CLOSTRIDIUM DIFFICILE BY PCR: Toxigenic C. Difficile by PCR: NEGATIVE

## 2019-10-29 ENCOUNTER — Encounter (HOSPITAL_BASED_OUTPATIENT_CLINIC_OR_DEPARTMENT_OTHER): Payer: Self-pay | Admitting: Emergency Medicine

## 2019-10-29 ENCOUNTER — Ambulatory Visit
Admission: RE | Admit: 2019-10-29 | Discharge: 2019-10-29 | Disposition: A | Discharge status: Home / Self Care | Payer: Medicare PPO | Source: Ambulatory Visit | Attending: Family Medicine | Admitting: Family Medicine

## 2019-10-29 ENCOUNTER — Emergency Department (HOSPITAL_BASED_OUTPATIENT_CLINIC_OR_DEPARTMENT_OTHER)
Admission: EM | Admit: 2019-10-29 | Discharge: 2019-10-29 | Disposition: A | Discharge status: Home / Self Care | Payer: Medicare PPO | Attending: Emergency Medicine | Admitting: Emergency Medicine

## 2019-10-29 ENCOUNTER — Emergency Department (HOSPITAL_BASED_OUTPATIENT_CLINIC_OR_DEPARTMENT_OTHER): Payer: Medicare PPO

## 2019-10-29 ENCOUNTER — Telehealth: Payer: Self-pay | Admitting: Family Medicine

## 2019-10-29 ENCOUNTER — Other Ambulatory Visit: Payer: Self-pay

## 2019-10-29 DIAGNOSIS — E876 Hypokalemia: Secondary | ICD-10-CM | POA: Insufficient documentation

## 2019-10-29 DIAGNOSIS — Z79899 Other long term (current) drug therapy: Secondary | ICD-10-CM | POA: Insufficient documentation

## 2019-10-29 DIAGNOSIS — E119 Type 2 diabetes mellitus without complications: Secondary | ICD-10-CM | POA: Insufficient documentation

## 2019-10-29 DIAGNOSIS — K8020 Calculus of gallbladder without cholecystitis without obstruction: Secondary | ICD-10-CM | POA: Diagnosis not present

## 2019-10-29 DIAGNOSIS — Z7982 Long term (current) use of aspirin: Secondary | ICD-10-CM | POA: Insufficient documentation

## 2019-10-29 DIAGNOSIS — R197 Diarrhea, unspecified: Secondary | ICD-10-CM | POA: Diagnosis not present

## 2019-10-29 DIAGNOSIS — R413 Other amnesia: Secondary | ICD-10-CM | POA: Diagnosis not present

## 2019-10-29 DIAGNOSIS — R109 Unspecified abdominal pain: Secondary | ICD-10-CM | POA: Diagnosis not present

## 2019-10-29 DIAGNOSIS — R0602 Shortness of breath: Secondary | ICD-10-CM | POA: Diagnosis not present

## 2019-10-29 DIAGNOSIS — G319 Degenerative disease of nervous system, unspecified: Secondary | ICD-10-CM | POA: Diagnosis not present

## 2019-10-29 DIAGNOSIS — I10 Essential (primary) hypertension: Secondary | ICD-10-CM | POA: Insufficient documentation

## 2019-10-29 DIAGNOSIS — I6782 Cerebral ischemia: Secondary | ICD-10-CM | POA: Diagnosis not present

## 2019-10-29 DIAGNOSIS — Z96653 Presence of artificial knee joint, bilateral: Secondary | ICD-10-CM | POA: Insufficient documentation

## 2019-10-29 DIAGNOSIS — G9389 Other specified disorders of brain: Secondary | ICD-10-CM | POA: Diagnosis not present

## 2019-10-29 DIAGNOSIS — K5730 Diverticulosis of large intestine without perforation or abscess without bleeding: Secondary | ICD-10-CM | POA: Diagnosis not present

## 2019-10-29 DIAGNOSIS — F0390 Unspecified dementia without behavioral disturbance: Secondary | ICD-10-CM

## 2019-10-29 LAB — COMPREHENSIVE METABOLIC PANEL
ALT: 15 U/L (ref 0–44)
AST: 20 U/L (ref 15–41)
Albumin: 3.4 g/dL — ABNORMAL LOW (ref 3.5–5.0)
Alkaline Phosphatase: 78 U/L (ref 38–126)
Anion gap: 12 (ref 5–15)
BUN: 24 mg/dL — ABNORMAL HIGH (ref 8–23)
CO2: 23 mmol/L (ref 22–32)
Calcium: 8.9 mg/dL (ref 8.9–10.3)
Chloride: 95 mmol/L — ABNORMAL LOW (ref 98–111)
Creatinine, Ser: 1.36 mg/dL — ABNORMAL HIGH (ref 0.61–1.24)
GFR calc Af Amer: 59 mL/min — ABNORMAL LOW (ref >60)
GFR calc non Af Amer: 51 mL/min — ABNORMAL LOW (ref >60)
Glucose, Bld: 165 mg/dL — ABNORMAL HIGH (ref 70–99)
Potassium: 2.9 mmol/L — ABNORMAL LOW (ref 3.5–5.1)
Sodium: 130 mmol/L — ABNORMAL LOW (ref 135–145)
Total Bilirubin: 2.2 mg/dL — ABNORMAL HIGH (ref 0.3–1.2)
Total Protein: 6.7 g/dL (ref 6.5–8.1)

## 2019-10-29 LAB — CBC
HCT: 47.1 % (ref 39.0–52.0)
Hemoglobin: 16.1 g/dL (ref 13.0–17.0)
MCH: 31.1 pg (ref 26.0–34.0)
MCHC: 34.2 g/dL (ref 30.0–36.0)
MCV: 90.9 fL (ref 80.0–100.0)
Platelets: 234 10*3/uL (ref 150–400)
RBC: 5.18 MIL/uL (ref 4.22–5.81)
RDW: 15.1 % (ref 11.5–15.5)
WBC: 9.1 10*3/uL (ref 4.0–10.5)
nRBC: 0 % (ref 0.0–0.2)

## 2019-10-29 LAB — MAGNESIUM: Magnesium: 1.8 mg/dL (ref 1.7–2.4)

## 2019-10-29 LAB — LIPASE, BLOOD: Lipase: 21 U/L (ref 11–51)

## 2019-10-29 MED ORDER — LOPERAMIDE HCL 2 MG PO CAPS
4.0000 mg | ORAL_CAPSULE | Freq: Once | ORAL | Status: AC
  Administered 2019-10-29: 4 mg via ORAL
  Filled 2019-10-29: qty 2

## 2019-10-29 MED ORDER — POTASSIUM CHLORIDE 10 MEQ/100ML IV SOLN
10.0000 meq | INTRAVENOUS | Status: AC
  Administered 2019-10-29: 10 meq via INTRAVENOUS
  Filled 2019-10-29: qty 100

## 2019-10-29 MED ORDER — POTASSIUM CHLORIDE CRYS ER 20 MEQ PO TBCR
40.0000 meq | EXTENDED_RELEASE_TABLET | Freq: Once | ORAL | Status: AC
  Administered 2019-10-29: 40 meq via ORAL
  Filled 2019-10-29: qty 2

## 2019-10-29 MED ORDER — IOHEXOL 300 MG/ML  SOLN
100.0000 mL | Freq: Once | INTRAMUSCULAR | Status: AC | PRN
  Administered 2019-10-29: 100 mL via INTRAVENOUS

## 2019-10-29 MED ORDER — SODIUM CHLORIDE 0.9 % IV BOLUS
1000.0000 mL | Freq: Once | INTRAVENOUS | Status: AC
  Administered 2019-10-29: 1000 mL via INTRAVENOUS

## 2019-10-29 NOTE — Discharge Instructions (Addendum)
Please begin taking Imodium for your diarrhea and see your GI doctor next week as scheduled.

## 2019-10-29 NOTE — ED Provider Notes (Signed)
[facility] EMER[facility]ENCY DEPARTMENT Provider Note  CSN: [id number] Arrival date & time: 10/29/19 1619    History Chief Complaint  Patient presents with  . Shortness of Breath  . Diarrhea    HPI  Gary Butler is a 74 y.o. male with history of multiple medical problems has had at least 2 months of frequent explosive watery diarrhea. He was seen by GI in July of this year and more recently by PCP for same. He reports 60lb weight loss, early satiety and poor appetite in addition to non bloody foul smelling stools about every 2 hours while he's awake. Does not often wake him from sleep. He had labs done by PCP 2 days ago including CBC, CMP, A1C and stool studies. All were fine except for A1C of 9.1. His C-diff and GI pathogen panel were negative. He also recently had a brain MRI for memory problems that was neg. His wife called PCP today due to continued symptoms and was advised to bring him to the ED.    Past Medical History:  Diagnosis Date  . Arthritis   . Constipation   . Diabetes mellitus   . Heart murmur   . History of gout   . History of skin cancer   . Hypertension   . OSA (obstructive sleep apnea)   . Psoriatic arthritis ([place])   . Rectal fissure   . Sleep apnea   . Tinnitus     Past Surgical History:  Procedure Laterality Date  . addenoids    . JOINT REPLACEMENT  2012   RT KNEE  . LUNG BIOPSY     20 YRS AGO  . TONSILLECTOMY    . 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 Emphysema Father   . Clotting disorder Father   . Arthritis Father   . Heart disease Mother   . Arthritis Mother   . Arthritis Maternal Grandmother   . Hypertension Maternal Grandmother   . Diabetes Maternal Grandmother   .  Arthritis Paternal Grandmother   . Diabetes Maternal Aunt   . Diabetes Paternal Aunt     Social History   Tobacco Use  . Smoking status: Never Smoker  . Smokeless tobacco: Never Used  Vaping Use  . Vaping Use: Never used  Substance Use Topics  . Alcohol use: Yes    Alcohol/week: 0.0 standard drinks    Comment: Once a month  . Drug use: No     Home Medications Prior to Admission medications   Medication Sig Start Date End Date Taking? Authorizing Provider  Albuterol Sulfate (PROAIR RESPICLICK) 450 (90 Base) MCG/ACT AEPB Inhale 2 puffs into the lungs every 6 (six) hours as needed (for wheezing or shortness of breath). 04/07/18   Brunetta Jeans, PA-C  allopurinol (ZYLOPRIM) 300 MG tablet TAKE 1 TABLET(300 MG) BY MOUTH DAILY 01/13/18   [name], MD  aspirin 81 MG tablet Take 81 mg by mouth daily.    [provider]  atorvastatin (LIPITOR) 40 MG tablet TAKE 1 TABLET(40 MG) BY MOUTH EVERY EVENING 07/06/19   [name], MD  bisoprolol (ZEBETA) 5 MG tablet TAKE 1 TABLET(5 MG) BY MOUTH DAILY 09/11/19   Tanda Rockers, MD  cetirizine (ZYRTEC) 10 MG tablet  TAKE 1 TABLET(10 MG) BY MOUTH DAILY 02/09/19   [name], MD  Cholecalciferol (VITAMIN D) 2000 UNITS tablet Take 2,000 Units by mouth daily.    [provider]  Cyanocobalamin (B-12) 2000 MCG TABS Take by mouth.    [provider]  donepezil (ARICEPT) 10 MG tablet Take 1 tablet (10 mg total) by mouth at bedtime. 08/10/19   [name], MD  empagliflozin (JARDIANCE) 10 MG TABS tablet Take 10 mg by mouth daily before breakfast. 04/22/19   [name], MD  etodolac (LODINE) 400 MG tablet Take by mouth.    [provider]  fluocinonide cream (LIDEX) 0.05 %  10/06/19   [provider]  fluticasone (FLONASE) 50 MCG/ACT nasal spray Place 2 sprays into both nostrils daily. 01/17/17   [name], MD  folic acid (FOLVITE) 1 MG tablet Take 1 mg by mouth  daily.  04/29/12   [provider]  furosemide (LASIX) 20 MG tablet TAKE 1 TABLET(20 MG) BY MOUTH DAILY 07/06/19   [name], MD  meloxicam (MOBIC) 15 MG tablet Take 1 tablet by mouth daily. 07/06/19   [name], MD  MYRBETRIQ 25 MG TB24 tablet  02/11/15   [provider]  nystatin ointment (MYCOSTATIN) Apply 1 application topically 2 (two) times daily. Patient taking differently: Apply 1 application topically 2 (two) times daily as needed.  06/27/15   [name], MD  omeprazole (PRILOSEC) 40 MG capsule TAKE 1 CAPSULE BY MOUTH 30 TO 60 MINUTES BEFORE YOUR FIRST AND LAST MEAL OF THE DAY 09/22/19   Tanda Rockers, MD  potassium chloride (MICRO-K) 10 MEQ CR capsule TAKE 1 CAPSULE BY MOUTH EVERY DAY 07/06/19   [name], MD  sertraline (ZOLOFT) 50 MG tablet TAKE 1 TABLET(50 MG) BY MOUTH DAILY. 04/17/19   [name], MD  telmisartan (MICARDIS) 40 MG tablet TAKE 1 TABLET(40 MG) BY MOUTH DAILY 03/19/19   Tanda Rockers, MD  tiZANidine (ZANAFLEX) 4 MG tablet Take 1 tablet (4 mg total) by mouth every 6 (six) hours as needed for muscle spasms. 07/26/16   [name], MD  triamterene-hydrochlorothiazide (MAXZIDE) 75-50 MG tablet Take 0.5 tablets by mouth every morning. Please call the office to schedule an appointment 07/06/19   [name], MD  UNABLE TO FIND Med Name: BiPap with [facility]    [provider]     Allergies    Lyrica [pregabalin], Penicillins, and Levofloxacin   Review of Systems   Review of Systems A comprehensive review of systems was completed and negative except as noted in HPI.    Physical Exam BP (!) 79/36 (BP Location: Right Arm)   Pulse 75   Temp 97.7 F (36.5 C) (Oral)   Resp 20   Ht 5\' 11"  (1.803 m)   Wt (!) 137.4 kg   SpO2 97%   BMI 42.26 kg/m   Physical Exam Vitals and nursing note reviewed.  Constitutional:      Appearance: Normal appearance.  HENT:     Head: Normocephalic and  atraumatic.     Nose: Nose normal.     Mouth/Throat:     Mouth: Mucous membranes are moist.  Eyes:     Extraocular Movements: Extraocular movements intact.     Conjunctiva/sclera: Conjunctivae normal.  Cardiovascular:     Rate and Rhythm: Normal rate.  Pulmonary:     Effort: Pulmonary effort is normal.     Breath sounds: Normal breath  sounds.  Abdominal:     General: Abdomen is flat. There is no distension.     Palpations: Abdomen is soft.     Tenderness: There is no abdominal tenderness. There is no guarding.  Musculoskeletal:        General: No swelling. Normal range of motion.     Cervical back: Neck supple.  Skin:    General: Skin is warm and dry.  Neurological:     General: No focal deficit present.     Mental Status: He is alert.  Psychiatric:        Mood and Affect: Mood normal.      ED Results / Procedures / Treatments   Labs (all labs ordered are listed, but only abnormal results are displayed) Labs Reviewed  COMPREHENSIVE METABOLIC PANEL - Abnormal; Notable for the following components:      Result Value   Sodium 130 (*)    Potassium 2.9 (*)    Chloride 95 (*)    Glucose, Bld 165 (*)    BUN 24 (*)    Creatinine, Ser 1.36 (*)    Albumin 3.4 (*)    Total Bilirubin 2.2 (*)    GFR calc non Af Amer 51 (*)    GFR calc Af Amer 59 (*)    All other components within normal limits  LIPASE, BLOOD  CBC  MAGNESIUM  URINALYSIS, ROUTINE W REFLEX MICROSCOPIC    EKG EKG Interpretation  Date/Time:  Thursday October 29 2019 17:15:54 EDT Ventricular Rate:  72 PR Interval:    QRS Duration: 98 QT Interval:  505 QTC Calculation: 553 R Axis:   -30 Text Interpretation: Normal sinus rhythm Left axis deviation Low voltage, precordial leads Abnormal R-wave progression, early transition Consider anterior infarct Prolonged QT interval No significant change since last tracing Confirmed by Calvert Cantor [phone]) on 10/29/2019 5:23:34 PM    Radiology MR Brain Wo  Contrast  Result Date: 10/29/2019 CLINICAL DATA:  Memory loss.  Dementia. EXAM: MRI HEAD WITHOUT CONTRAST TECHNIQUE: Multiplanar, multiecho pulse sequences of the brain and surrounding structures were obtained without intravenous contrast. COMPARISON:  02/28/2006 FINDINGS: Brain: Generalized atrophy with relative frontal and temporal predominance. Diffusion imaging does not show any acute or subacute infarction. Mild chronic small-vessel ischemic changes affect pons. No focal cerebellar finding. Cerebral hemispheres show moderate chronic small-vessel ischemic change of the white matter. No cortical or large vessel territory infarction. No mass lesion, hemorrhage, hydrocephalus or extra-axial collection. Vascular: Major vessels at the base of the brain show flow. Skull and upper cervical spine: Negative Sinuses/Orbits: Clear/normal Other: None IMPRESSION: No acute or reversible finding. Generalized atrophy with relative frontal and temporal predominance. Moderate chronic small-vessel ischemic changes of the cerebral hemispheric white matter. Electronically Signed   By: Nelson Chimes M.D.   On: 10/29/2019 10:53   CT Abdomen Pelvis W Contrast  Result Date: 10/29/2019 CLINICAL DATA:  Diarrhea and shortness of breath for 6 weeks. EXAM: CT ABDOMEN AND PELVIS WITH CONTRAST TECHNIQUE: Multidetector CT imaging of the abdomen and pelvis was performed using the standard protocol following bolus administration of intravenous contrast. CONTRAST:  114mL OMNIPAQUE IOHEXOL 300 MG/ML  SOLN COMPARISON:  Abdominal ultrasound 05/19/2012, 06/05/2011 FINDINGS: Lower chest: Subpleural fat noted in the lung bases. Few bandlike areas of opacity, likely scarring or atelectasis. Lung bases are otherwise clear. Abundant mediastinal and pericardial fat noted as well. Cardiac size is within normal limits. No pericardial effusion. Hepatobiliary: Geographic regions of hypoattenuation throughout the liver likely reflect areas of hepatic  steatosis/fatty infiltration. No focal concerning liver lesion. Smooth liver surface contour. Gallbladder contains multiple dependently layering calcified gallstones. No gallbladder wall thickening, pericholecystic fluid or inflammation. No visible calcified intraductal gallstones or biliary dilatation. Pancreas: Partial fatty replacement of the pancreas. No pancreatic ductal dilatation or surrounding inflammatory changes. Spleen: No frank splenomegaly.  No concerning splenic lesions. Adrenals/Urinary Tract: Calcification of the left adrenal gland could reflect sequela of prior infection or hemorrhage. No concerning adrenal lesion. Bilateral renal atrophy/cortical thinning. Multiple fluid attenuation cysts are present in both kidneys, largest is seen in the right kidney measuring up to 7.1 cm in the upper pole. No concerning focal renal lesions. No urolithiasis or hydronephrosis. Moderate bladder distension without other gross bladder abnormality. Stomach/Bowel: Distal esophagus, stomach and duodenum are unremarkable. No small bowel thickening or dilatation. A normal appendix is visualized in the right lower quadrant. No proximal colonic thickening or dilatation. Numerous distal colonic diverticula and a segment of circumferentially thickened versus decompressed colon seen in the proximal sigmoid in a region of these diverticular but without acute pericolonic inflammation to suggest an active diverticulitis at this time. No evidence of bowel obstruction. Vascular/Lymphatic: Atherosclerotic calcifications within the abdominal aorta and branch vessels. No aneurysm or ectasia. No enlarged abdominopelvic lymph nodes. Reproductive: The prostate and seminal vesicles are unremarkable. Other: No abdominopelvic free fluid or free gas. No bowel containing hernias. Fat containing inguinal hernias bilaterally. Small fat containing umbilical hernia. Musculoskeletal: Mild dextrocurvature of the spine. Multilevel flowing anterior  osteophytosis of the lower thoracic levels, compatible with features of diffuse idiopathic skeletal hyperostosis (DISH). Additional multilevel discogenic and facet degenerative changes in the lumbar levels as well. No mild-to-moderate degenerative changes in the hips and pelvis. No acute or suspicious osseous lesions. IMPRESSION: 1. Extensive distal colonic diverticulosis. Segment of circumferentially thickened versus decompressed colon seen in the proximal sigmoid in a region of these diverticula but without acute pericolonic inflammation. May reflect sequela of prior inflammation though should consider outpatient correlation with colonoscopy if not recently performed. 2. Cholelithiasis without evidence of acute cholecystitis. 3. Geographic regions of hypoattenuation throughout the liver likely reflect areas of hepatic steatosis/fatty infiltration. 4. Bilateral renal atrophy/cortical thinning. Multiple simple appearing cysts bilaterally. 5. Aortic Atherosclerosis (ICD10-I70.0). Electronically Signed   By: Lovena Le M.D.   On: 10/29/2019 18:42    Procedures Procedures  Medications Ordered in the ED Medications  potassium chloride 10 mEq in 100 mL IVPB ( Intravenous Stopped 10/29/19 2009)  sodium chloride 0.9 % bolus 1,000 mL ( Intravenous Stopped 10/29/19 2010)  potassium chloride SA (KLOR-CON) CR tablet 40 mEq (40 mEq Oral Given 10/29/19 1746)  iohexol (OMNIPAQUE) 300 MG/ML solution 100 mL (100 mLs Intravenous Contrast Given 10/29/19 1757)  loperamide (IMODIUM) capsule 4 mg (4 mg Oral Given 10/29/19 2032)     MDM Rules/Calculators/A&P MDM  ED Course  I have reviewed the triage vital signs and the nursing notes.  Pertinent labs & imaging results that were available during my care of the patient were reviewed by me and considered in my medical decision making (see chart for details).  Clinical Course as of Oct 29 2255  Thu Oct 29, 2019  1714 CBC is normal.    [CS]  8938 EKG: Sinus with  PACs LAD Low voltage Nonspecific t wave flattening No old available   [CS]  1733 CMP shows lower K and Na than recent outpatient labs. Will add Mg. Replete PO and IV pending CT.    [CS]  1949 Patient's CT reviewed, no  acute process. His BP is improved and he has not had diarrhea since arrival. Potassium replaced. He already has followup with GI scheduled early next week. Will give imodium in the meantime for symptom control.    [CS]  2031 Patient declines second run of IV K and would like to go home .   [CS]    Clinical Course User Index [CS] Truddie Hidden, MD    Final Clinical Impression(s) / ED Diagnoses Final diagnoses:  Diarrhea, unspecified type  Hypokalemia    Rx / DC Orders ED Discharge Orders    None       Truddie Hidden, MD 10/29/19 2258

## 2019-10-29 NOTE — Telephone Encounter (Signed)
Patients wife states that he has been having excessive diarhhea - still having difficulty swallowing and seems to be getting weaker every day.  Wife states that Dr. Birdie Riddle should have test results back by now.

## 2019-10-29 NOTE — Telephone Encounter (Signed)
Spoke with pt wife, reviewed patients labs and MRI results. Advised that if pt is still having problems eating, keeping fluids down and having severely loose stools then he would need to go to the ER.   Also advised that they NEED to contact Dr. Earlean Shawl at GI and let them know what is going on as pt is suppose to have an EGD on Tuesday and they may need to get him in sooner.   Pt wife stated an understanding.

## 2019-10-29 NOTE — ED Triage Notes (Addendum)
Diarrhea and SOB x 6 weeks. Has been seen by  PCP with labs drawn, brain MRI, and gave a stool sample. The only thing abnormal that was found was an elevated hgb A1c. Pt c/o generalized weakness.

## 2019-10-31 LAB — SALMONELLA/SHIGELLA CULT, CAMPY EIA AND SHIGA TOXIN RFL ECOLI
MICRO NUMBER: 10949962
MICRO NUMBER:: 10949963
MICRO NUMBER:: 10949964
Result:: NOT DETECTED
SHIGA RESULT:: NOT DETECTED
SPECIMEN QUALITY: ADEQUATE
SPECIMEN QUALITY:: ADEQUATE
SPECIMEN QUALITY:: ADEQUATE

## 2019-11-03 DIAGNOSIS — K3189 Other diseases of stomach and duodenum: Secondary | ICD-10-CM | POA: Diagnosis not present

## 2019-11-03 DIAGNOSIS — K317 Polyp of stomach and duodenum: Secondary | ICD-10-CM | POA: Diagnosis not present

## 2019-11-03 DIAGNOSIS — R197 Diarrhea, unspecified: Secondary | ICD-10-CM | POA: Diagnosis not present

## 2019-11-03 DIAGNOSIS — K2950 Unspecified chronic gastritis without bleeding: Secondary | ICD-10-CM | POA: Diagnosis not present

## 2019-11-03 DIAGNOSIS — R630 Anorexia: Secondary | ICD-10-CM | POA: Diagnosis not present

## 2019-11-03 DIAGNOSIS — R1310 Dysphagia, unspecified: Secondary | ICD-10-CM | POA: Diagnosis not present

## 2019-11-03 DIAGNOSIS — R634 Abnormal weight loss: Secondary | ICD-10-CM | POA: Diagnosis not present

## 2019-11-06 DIAGNOSIS — R197 Diarrhea, unspecified: Secondary | ICD-10-CM | POA: Diagnosis not present

## 2019-11-07 ENCOUNTER — Other Ambulatory Visit: Payer: Self-pay | Admitting: Internal Medicine

## 2019-11-07 MED ORDER — BISOPROLOL FUMARATE 5 MG PO TABS: ORAL_TABLET | ORAL | 3 refills | Status: DC

## 2019-11-09 ENCOUNTER — Inpatient Hospital Stay (HOSPITAL_BASED_OUTPATIENT_CLINIC_OR_DEPARTMENT_OTHER)
Admission: EM | Admit: 2019-11-09 | Discharge: 2019-11-17 | DRG: 872 | Disposition: A | Discharge status: Skilled Nursing Facility | Payer: Medicare PPO | Attending: Internal Medicine | Admitting: Internal Medicine

## 2019-11-09 ENCOUNTER — Encounter (HOSPITAL_BASED_OUTPATIENT_CLINIC_OR_DEPARTMENT_OTHER): Payer: Self-pay | Admitting: Emergency Medicine

## 2019-11-09 ENCOUNTER — Other Ambulatory Visit: Payer: Self-pay

## 2019-11-09 ENCOUNTER — Emergency Department (HOSPITAL_BASED_OUTPATIENT_CLINIC_OR_DEPARTMENT_OTHER): Payer: Medicare PPO

## 2019-11-09 DIAGNOSIS — M1990 Unspecified osteoarthritis, unspecified site: Secondary | ICD-10-CM | POA: Diagnosis present

## 2019-11-09 DIAGNOSIS — N179 Acute kidney failure, unspecified: Secondary | ICD-10-CM | POA: Diagnosis present

## 2019-11-09 DIAGNOSIS — I5032 Chronic diastolic (congestive) heart failure: Secondary | ICD-10-CM | POA: Diagnosis present

## 2019-11-09 DIAGNOSIS — K649 Unspecified hemorrhoids: Secondary | ICD-10-CM | POA: Diagnosis present

## 2019-11-09 DIAGNOSIS — I110 Hypertensive heart disease with heart failure: Secondary | ICD-10-CM | POA: Diagnosis present

## 2019-11-09 DIAGNOSIS — E1140 Type 2 diabetes mellitus with diabetic neuropathy, unspecified: Secondary | ICD-10-CM | POA: Diagnosis not present

## 2019-11-09 DIAGNOSIS — M6281 Muscle weakness (generalized): Secondary | ICD-10-CM | POA: Diagnosis not present

## 2019-11-09 DIAGNOSIS — M79609 Pain in unspecified limb: Secondary | ICD-10-CM | POA: Diagnosis not present

## 2019-11-09 DIAGNOSIS — R339 Retention of urine, unspecified: Secondary | ICD-10-CM | POA: Diagnosis present

## 2019-11-09 DIAGNOSIS — Z8614 Personal history of Methicillin resistant Staphylococcus aureus infection: Secondary | ICD-10-CM

## 2019-11-09 DIAGNOSIS — Z1624 Resistance to multiple antibiotics: Secondary | ICD-10-CM | POA: Diagnosis not present

## 2019-11-09 DIAGNOSIS — Z79899 Other long term (current) drug therapy: Secondary | ICD-10-CM

## 2019-11-09 DIAGNOSIS — R338 Other retention of urine: Secondary | ICD-10-CM | POA: Diagnosis not present

## 2019-11-09 DIAGNOSIS — A4151 Sepsis due to Escherichia coli [E. coli]: Secondary | ICD-10-CM | POA: Diagnosis not present

## 2019-11-09 DIAGNOSIS — Z7982 Long term (current) use of aspirin: Secondary | ICD-10-CM

## 2019-11-09 DIAGNOSIS — R5381 Other malaise: Secondary | ICD-10-CM | POA: Diagnosis present

## 2019-11-09 DIAGNOSIS — Z7984 Long term (current) use of oral hypoglycemic drugs: Secondary | ICD-10-CM

## 2019-11-09 DIAGNOSIS — F0390 Unspecified dementia without behavioral disturbance: Secondary | ICD-10-CM | POA: Diagnosis present

## 2019-11-09 DIAGNOSIS — Z23 Encounter for immunization: Secondary | ICD-10-CM | POA: Diagnosis not present

## 2019-11-09 DIAGNOSIS — I4891 Unspecified atrial fibrillation: Secondary | ICD-10-CM | POA: Diagnosis present

## 2019-11-09 DIAGNOSIS — N390 Urinary tract infection, site not specified: Secondary | ICD-10-CM | POA: Diagnosis present

## 2019-11-09 DIAGNOSIS — Z8261 Family history of arthritis: Secondary | ICD-10-CM | POA: Diagnosis not present

## 2019-11-09 DIAGNOSIS — E785 Hyperlipidemia, unspecified: Secondary | ICD-10-CM | POA: Diagnosis not present

## 2019-11-09 DIAGNOSIS — Z20822 Contact with and (suspected) exposure to covid-19: Secondary | ICD-10-CM | POA: Diagnosis present

## 2019-11-09 DIAGNOSIS — E43 Unspecified severe protein-calorie malnutrition: Secondary | ICD-10-CM | POA: Diagnosis not present

## 2019-11-09 DIAGNOSIS — E860 Dehydration: Secondary | ICD-10-CM | POA: Diagnosis not present

## 2019-11-09 DIAGNOSIS — K219 Gastro-esophageal reflux disease without esophagitis: Secondary | ICD-10-CM | POA: Diagnosis not present

## 2019-11-09 DIAGNOSIS — K611 Rectal abscess: Secondary | ICD-10-CM | POA: Diagnosis present

## 2019-11-09 DIAGNOSIS — N493 Fournier gangrene: Secondary | ICD-10-CM

## 2019-11-09 DIAGNOSIS — Z825 Family history of asthma and other chronic lower respiratory diseases: Secondary | ICD-10-CM

## 2019-11-09 DIAGNOSIS — G4733 Obstructive sleep apnea (adult) (pediatric): Secondary | ICD-10-CM | POA: Diagnosis not present

## 2019-11-09 DIAGNOSIS — R1110 Vomiting, unspecified: Secondary | ICD-10-CM | POA: Diagnosis not present

## 2019-11-09 DIAGNOSIS — E119 Type 2 diabetes mellitus without complications: Secondary | ICD-10-CM | POA: Diagnosis not present

## 2019-11-09 DIAGNOSIS — Z794 Long term (current) use of insulin: Secondary | ICD-10-CM

## 2019-11-09 DIAGNOSIS — I10 Essential (primary) hypertension: Secondary | ICD-10-CM | POA: Diagnosis present

## 2019-11-09 DIAGNOSIS — R197 Diarrhea, unspecified: Secondary | ICD-10-CM | POA: Diagnosis present

## 2019-11-09 DIAGNOSIS — L02215 Cutaneous abscess of perineum: Secondary | ICD-10-CM | POA: Diagnosis not present

## 2019-11-09 DIAGNOSIS — M2550 Pain in unspecified joint: Secondary | ICD-10-CM | POA: Diagnosis not present

## 2019-11-09 DIAGNOSIS — E1165 Type 2 diabetes mellitus with hyperglycemia: Secondary | ICD-10-CM | POA: Diagnosis not present

## 2019-11-09 DIAGNOSIS — Z7401 Bed confinement status: Secondary | ICD-10-CM | POA: Diagnosis not present

## 2019-11-09 DIAGNOSIS — Z8249 Family history of ischemic heart disease and other diseases of the circulatory system: Secondary | ICD-10-CM

## 2019-11-09 DIAGNOSIS — I480 Paroxysmal atrial fibrillation: Secondary | ICD-10-CM | POA: Diagnosis not present

## 2019-11-09 DIAGNOSIS — R2681 Unsteadiness on feet: Secondary | ICD-10-CM | POA: Diagnosis not present

## 2019-11-09 DIAGNOSIS — Z6841 Body Mass Index (BMI) 40.0 and over, adult: Secondary | ICD-10-CM | POA: Diagnosis not present

## 2019-11-09 DIAGNOSIS — K760 Fatty (change of) liver, not elsewhere classified: Secondary | ICD-10-CM | POA: Diagnosis not present

## 2019-11-09 DIAGNOSIS — M726 Necrotizing fasciitis: Secondary | ICD-10-CM | POA: Diagnosis not present

## 2019-11-09 DIAGNOSIS — Z791 Long term (current) use of non-steroidal anti-inflammatories (NSAID): Secondary | ICD-10-CM

## 2019-11-09 DIAGNOSIS — Z48817 Encounter for surgical aftercare following surgery on the skin and subcutaneous tissue: Secondary | ICD-10-CM | POA: Diagnosis not present

## 2019-11-09 DIAGNOSIS — R112 Nausea with vomiting, unspecified: Secondary | ICD-10-CM

## 2019-11-09 DIAGNOSIS — M109 Gout, unspecified: Secondary | ICD-10-CM | POA: Diagnosis present

## 2019-11-09 DIAGNOSIS — Z9989 Dependence on other enabling machines and devices: Secondary | ICD-10-CM | POA: Diagnosis present

## 2019-11-09 DIAGNOSIS — R41841 Cognitive communication deficit: Secondary | ICD-10-CM | POA: Diagnosis not present

## 2019-11-09 DIAGNOSIS — B372 Candidiasis of skin and nail: Secondary | ICD-10-CM | POA: Diagnosis present

## 2019-11-09 DIAGNOSIS — I69828 Other speech and language deficits following other cerebrovascular disease: Secondary | ICD-10-CM | POA: Diagnosis not present

## 2019-11-09 DIAGNOSIS — Z833 Family history of diabetes mellitus: Secondary | ICD-10-CM

## 2019-11-09 DIAGNOSIS — Z832 Family history of diseases of the blood and blood-forming organs and certain disorders involving the immune mechanism: Secondary | ICD-10-CM

## 2019-11-09 DIAGNOSIS — Z96653 Presence of artificial knee joint, bilateral: Secondary | ICD-10-CM | POA: Diagnosis present

## 2019-11-09 HISTORY — DX: Morbid (severe) obesity due to excess calories: E66.01

## 2019-11-09 LAB — URINALYSIS, MICROSCOPIC (REFLEX)

## 2019-11-09 LAB — URINALYSIS, ROUTINE W REFLEX MICROSCOPIC
Bilirubin Urine: NEGATIVE
Glucose, UA: >=500 mg/dL — AB
Hgb urine dipstick: NEGATIVE
Ketones, ur: NEGATIVE mg/dL
Leukocytes,Ua: NEGATIVE
Nitrite: NEGATIVE
Protein, ur: NEGATIVE mg/dL
Specific Gravity, Urine: <1.005 — ABNORMAL LOW (ref 1.005–1.030)
pH: 5.5 (ref 5.0–8.0)

## 2019-11-09 LAB — CBC WITH DIFFERENTIAL/PLATELET
Abs Immature Granulocytes: 0.12 10*3/uL — ABNORMAL HIGH (ref 0.00–0.07)
Basophils Absolute: 0.1 10*3/uL (ref 0.0–0.1)
Basophils Relative: 0 %
Eosinophils Absolute: 0 10*3/uL (ref 0.0–0.5)
Eosinophils Relative: 0 %
HCT: 46.7 % (ref 39.0–52.0)
Hemoglobin: 16.3 g/dL (ref 13.0–17.0)
Immature Granulocytes: 1 %
Lymphocytes Relative: 3 %
Lymphs Abs: 0.5 10*3/uL — ABNORMAL LOW (ref 0.7–4.0)
MCH: 31.7 pg (ref 26.0–34.0)
MCHC: 34.9 g/dL (ref 30.0–36.0)
MCV: 90.9 fL (ref 80.0–100.0)
Monocytes Absolute: 0.8 10*3/uL (ref 0.1–1.0)
Monocytes Relative: 5 %
Neutro Abs: 15.3 10*3/uL — ABNORMAL HIGH (ref 1.7–7.7)
Neutrophils Relative %: 91 %
Platelets: 277 10*3/uL (ref 150–400)
RBC: 5.14 MIL/uL (ref 4.22–5.81)
RDW: 15.5 % (ref 11.5–15.5)
WBC: 16.7 10*3/uL — ABNORMAL HIGH (ref 4.0–10.5)
nRBC: 0 % (ref 0.0–0.2)

## 2019-11-09 LAB — COMPREHENSIVE METABOLIC PANEL
ALT: 15 U/L (ref 0–44)
AST: 25 U/L (ref 15–41)
Albumin: 3.3 g/dL — ABNORMAL LOW (ref 3.5–5.0)
Alkaline Phosphatase: 92 U/L (ref 38–126)
Anion gap: 17 — ABNORMAL HIGH (ref 5–15)
BUN: 19 mg/dL (ref 8–23)
CO2: 21 mmol/L — ABNORMAL LOW (ref 22–32)
Calcium: 9.3 mg/dL (ref 8.9–10.3)
Chloride: 94 mmol/L — ABNORMAL LOW (ref 98–111)
Creatinine, Ser: 1.5 mg/dL — ABNORMAL HIGH (ref 0.61–1.24)
GFR calc Af Amer: 52 mL/min — ABNORMAL LOW (ref >60)
GFR calc non Af Amer: 45 mL/min — ABNORMAL LOW (ref >60)
Glucose, Bld: 259 mg/dL — ABNORMAL HIGH (ref 70–99)
Potassium: 3.3 mmol/L — ABNORMAL LOW (ref 3.5–5.1)
Sodium: 132 mmol/L — ABNORMAL LOW (ref 135–145)
Total Bilirubin: 1.4 mg/dL — ABNORMAL HIGH (ref 0.3–1.2)
Total Protein: 6.8 g/dL (ref 6.5–8.1)

## 2019-11-09 LAB — RESPIRATORY PANEL BY RT PCR (FLU A&B, COVID)
Influenza A by PCR: NEGATIVE
Influenza B by PCR: NEGATIVE
SARS Coronavirus 2 by RT PCR: NEGATIVE

## 2019-11-09 LAB — CBG MONITORING, ED: Glucose-Capillary: 223 mg/dL — ABNORMAL HIGH (ref 70–99)

## 2019-11-09 LAB — LACTIC ACID, PLASMA
Lactic Acid, Venous: 3 mmol/L (ref 0.5–1.9)
Lactic Acid, Venous: 2 mmol/L (ref 0.5–1.9)

## 2019-11-09 LAB — LIPASE, BLOOD: Lipase: 22 U/L (ref 11–51)

## 2019-11-09 LAB — TROPONIN I (HIGH SENSITIVITY): Troponin I (High Sensitivity): 14 ng/L (ref <18)

## 2019-11-09 MED ORDER — IOHEXOL 300 MG/ML  SOLN
100.0000 mL | Freq: Once | INTRAMUSCULAR | Status: AC | PRN
  Administered 2019-11-09: 80 mL via INTRAVENOUS

## 2019-11-09 MED ORDER — INSULIN ASPART 100 UNIT/ML ~~LOC~~ SOLN
0.0000 [IU] | SUBCUTANEOUS | Status: DC
  Administered 2019-11-10: 11 [IU] via SUBCUTANEOUS
  Administered 2019-11-10: 4 [IU] via SUBCUTANEOUS
  Filled 2019-11-09: qty 0.2

## 2019-11-09 MED ORDER — VANCOMYCIN HCL IN DEXTROSE 1-5 GM/200ML-% IV SOLN
1000.0000 mg | Freq: Two times a day (BID) | INTRAVENOUS | Status: DC
  Administered 2019-11-10 – 2019-11-12 (×5): 1000 mg via INTRAVENOUS
  Filled 2019-11-09 (×5): qty 200

## 2019-11-09 MED ORDER — VANCOMYCIN HCL IN DEXTROSE 1-5 GM/200ML-% IV SOLN
1000.0000 mg | INTRAVENOUS | Status: AC
  Administered 2019-11-09: 1000 mg via INTRAVENOUS
  Filled 2019-11-09: qty 200

## 2019-11-09 MED ORDER — CLINDAMYCIN PHOSPHATE 900 MG/50ML IV SOLN
900.0000 mg | Freq: Once | INTRAVENOUS | Status: AC
  Administered 2019-11-09: 900 mg via INTRAVENOUS
  Filled 2019-11-09: qty 50

## 2019-11-09 MED ORDER — LACTATED RINGERS IV BOLUS
1000.0000 mL | Freq: Once | INTRAVENOUS | Status: AC
  Administered 2019-11-09: 1000 mL via INTRAVENOUS

## 2019-11-09 MED ORDER — SODIUM CHLORIDE 0.9 % IV SOLN
2.0000 g | Freq: Once | INTRAVENOUS | Status: AC
  Administered 2019-11-09: 2 g via INTRAVENOUS
  Filled 2019-11-09: qty 2

## 2019-11-09 MED ORDER — SODIUM CHLORIDE 0.9 % IV SOLN
2.0000 g | Freq: Three times a day (TID) | INTRAVENOUS | Status: DC
  Administered 2019-11-10 – 2019-11-12 (×7): 2 g via INTRAVENOUS
  Filled 2019-11-09 (×8): qty 2

## 2019-11-09 NOTE — ED Provider Notes (Addendum)
[facility] EMERGENCY DEPARTMENT Provider Note   CSN: [id number] Arrival date & time: 11/09/19  1757     History Chief Complaint  Patient presents with  . Emesis  . Weakness    Gary Butler is a 74 y.o. male.  74yo M w/ PMH below including OSA, T2DM, HTN, CHF, gout, psoriatic arthritis, morbid obesity who presents with weakness, vomiting, and diarrhea.  Patient reports a longstanding problem with profuse diarrhea that has been going on at least 8 weeks and for which he has followed with his PCP and more recently his gastroenterologist.  He had an endoscopy 6 days ago during which she had esophageal dilation procedure.  Over the past few days, he has had problems with vomiting especially after he tries to eat or drink something.  He has not been able to keep much down.  His diarrhea problem has been ongoing and GI ordered stool studies but they do not have results yet.  He reports occasional abdominal pain.  No known fevers and no cough/cold symptoms.  He has had progressively worsening generalized weakness and wife reports difficulty walking due to weakness.  The history is provided by the patient and the spouse.  Emesis Weakness Associated symptoms: vomiting        Past Medical History:  Diagnosis Date  . Arthritis   . Constipation   . Diabetes mellitus   . Heart murmur   . History of gout   . History of skin cancer   . Hypertension   . Morbid (severe) obesity due to excess calories ([place])   . OSA (obstructive sleep apnea)   . Psoriatic arthritis ([place])   . Rectal fissure   . Sleep apnea   . Tinnitus     Patient Active Problem List   Diagnosis Date Noted  . Poorly controlled diabetes mellitus ([place]) 11/09/2019  . Dyspnea on exertion 02/19/2017  . Bilateral lower extremity edema 07/06/2015  . CHF (congestive heart failure) ([place]) 06/27/2015  . Fungal dermatitis 06/27/2015  . Edema 06/20/2015  . Increasing shortness of breath 06/20/2015  . Lumbar radiculopathy,  acute 03/09/2015  . Essential hypertension 12/27/2014  . Type 2 diabetes mellitus with diabetic neuropathy, unspecified ([place]) 12/27/2014  . Mild aortic stenosis 06/28/2014  . Memory loss 04/29/2014  . Tinnitus of both ears 02/17/2014  . Wellness examination 12/04/2013  . Constipation due to slow transit 08/18/2013  . Morbid obesity due to excess calories ([place]) 06/23/2013  . S/P left TKA 06/22/2013  . Meniere disease 04/21/2013  . Benign paroxysmal positional vertigo 04/21/2013  . Psoriatic arthritis ([place]) 05/14/2012  . General medical exam 01/28/2012  . Cellulitis of eyelid 09/25/2011  . Diverticulitis 06/20/2011  . Erythrocytosis 05/18/2011  . S/P TKR (total knee replacement) 12/31/2010  . Obesity 12/31/2010  . Hyperlipidemia 12/22/2010  . BPH (benign prostatic hyperplasia) 12/22/2010  . Gout 12/22/2010  . Obstructive sleep apnea 04/04/2009    Past Surgical History:  Procedure Laterality Date  . addenoids    . JOINT REPLACEMENT  2012   RT KNEE  . LUNG BIOPSY     20 YRS AGO  . TONSILLECTOMY    . TOTAL KNEE ARTHROPLASTY Bilateral 06/22/2013   Procedure: LEFT TOTAL KNEE ARTHROPLASTY;  MEDIAL SOFT TISSUE EXPLORATION SAPHENOUS NEURECTOMY RIGHT KNEE;  Surgeon: Mauri Pole, MD;  Location: [facility];  Service: Orthopedics;  Laterality: Bilateral;       Family History  Problem Relation Age of Onset  . Emphysema Father   . Clotting disorder  Father   . Arthritis Father   . Heart disease Mother   . Arthritis Mother   . Arthritis Maternal Grandmother   . Hypertension Maternal Grandmother   . Diabetes Maternal Grandmother   . Arthritis Paternal Grandmother   . Diabetes Maternal Aunt   . Diabetes Paternal Aunt     Social History   Tobacco Use  . Smoking status: Never Smoker  . Smokeless tobacco: Never Used  Vaping Use  . Vaping Use: Never used  Substance Use Topics  . Alcohol use: Yes    Alcohol/week: 0.0 standard drinks    Comment: Once a month  . Drug use: No     Home Medications Prior to Admission medications   Medication Sig Start Date End Date Taking? Authorizing Provider  Albuterol Sulfate (PROAIR RESPICLICK) 630 (90 Base) MCG/ACT AEPB Inhale 2 puffs into the lungs every 6 (six) hours as needed (for wheezing or shortness of breath). 04/07/18   Brunetta Jeans, PA-C  allopurinol (ZYLOPRIM) 300 MG tablet TAKE 1 TABLET(300 MG) BY MOUTH DAILY 01/13/18   [name], MD  aspirin 81 MG tablet Take 81 mg by mouth daily.    [provider]  atorvastatin (LIPITOR) 40 MG tablet TAKE 1 TABLET(40 MG) BY MOUTH EVERY EVENING 07/06/19   [name], MD  bisoprolol (ZEBETA) 5 MG tablet TAKE 1 TABLET(5 MG) BY MOUTH DAILY 11/07/19   Tanda Rockers, MD  cetirizine (ZYRTEC) 10 MG tablet TAKE 1 TABLET(10 MG) BY MOUTH DAILY 02/09/19   [name], MD  Cholecalciferol (VITAMIN D) 2000 UNITS tablet Take 2,000 Units by mouth daily.    [provider]  Cyanocobalamin (B-12) 2000 MCG TABS Take by mouth.    [provider]  donepezil (ARICEPT) 10 MG tablet Take 1 tablet (10 mg total) by mouth at bedtime. 08/10/19   [name], MD  empagliflozin (JARDIANCE) 10 MG TABS tablet Take 10 mg by mouth daily before breakfast. 04/22/19   [name], MD  etodolac (LODINE) 400 MG tablet Take by mouth.    [provider]  fluocinonide cream (LIDEX) 0.05 %  10/06/19   [provider]  fluticasone (FLONASE) 50 MCG/ACT nasal spray Place 2 sprays into both nostrils daily. 01/17/17   [name], MD  folic acid (FOLVITE) 1 MG tablet Take 1 mg by mouth daily.  04/29/12   [provider]  furosemide (LASIX) 20 MG tablet TAKE 1 TABLET(20 MG) BY MOUTH DAILY 07/06/19   [name], MD  meloxicam (MOBIC) 15 MG tablet Take 1 tablet by mouth daily. 07/06/19   [name], MD  MYRBETRIQ 25 MG TB24 tablet  02/11/15   [provider]  nystatin ointment (MYCOSTATIN) Apply 1  application topically 2 (two) times daily. Patient taking differently: Apply 1 application topically 2 (two) times daily as needed.  06/27/15   [name], MD  omeprazole (PRILOSEC) 40 MG capsule TAKE 1 CAPSULE BY MOUTH 30 TO 60 MINUTES BEFORE YOUR FIRST AND LAST MEAL OF THE DAY 09/22/19   Tanda Rockers, MD  potassium chloride (MICRO-K) 10 MEQ CR capsule TAKE 1 CAPSULE BY MOUTH EVERY DAY 07/06/19   [name], MD  sertraline (ZOLOFT) 50 MG tablet TAKE 1 TABLET(50 MG) BY MOUTH DAILY. 04/17/19   [name], MD  telmisartan (MICARDIS) 40 MG tablet TAKE 1 TABLET(40 MG) BY MOUTH DAILY 03/19/19   Tanda Rockers, MD  tiZANidine (ZANAFLEX) 4 MG tablet Take  1 tablet (4 mg total) by mouth every 6 (six) hours as needed for muscle spasms. 07/26/16   [name], MD  triamterene-hydrochlorothiazide (MAXZIDE) 75-50 MG tablet Take 0.5 tablets by mouth every morning. Please call the office to schedule an appointment 07/06/19   [name], MD  UNABLE TO FIND Med Name: BiPap with [place]    [provider]    Allergies    Lyrica [pregabalin], Penicillins, and Levofloxacin  Review of Systems   Review of Systems  Gastrointestinal: Positive for vomiting.  Neurological: Positive for weakness.   All other systems reviewed and are negative except that which was mentioned in HPI  Physical Exam Updated Vital Signs BP (!) 104/58 (BP Location: Right Arm)   Pulse 86   Temp 99.7 F (37.6 C)   Resp (!) 22   SpO2 98%   Physical Exam Vitals and nursing note reviewed. Exam conducted with a chaperone present.  Constitutional:      General: He is not in acute distress.    Appearance: He is well-developed. He is obese.  HENT:     Head: Normocephalic and atraumatic.     Mouth/Throat:     Mouth: Mucous membranes are dry.  Eyes:     Conjunctiva/sclera: Conjunctivae normal.  Cardiovascular:     Rate and Rhythm: Normal rate. Rhythm irregular.     Heart sounds: Normal  heart sounds. No murmur heard.   Pulmonary:     Effort: Pulmonary effort is normal.     Breath sounds: Normal breath sounds.  Abdominal:     General: Bowel sounds are normal. There is no distension.     Palpations: Abdomen is soft.     Tenderness: There is no abdominal tenderness.  Genitourinary:    Comments: Candidal skin infection of skin folds in inguinal areas and under panus, inverted penis w/ mild skin irritation/erythema on glans penis; pain with manipulation of scrotum but no obvious skin abnormalities/fluctuance/induration of scrotum or perineum Musculoskeletal:     Cervical back: Neck supple.  Skin:    General: Skin is warm and dry.     Comments: Scattered bruises on lower legs and right forearm  Neurological:     Mental Status: He is alert and oriented to person, place, and time.     Comments: Fluent speech  Psychiatric:        Judgment: Judgment normal.     ED Results / Procedures / Treatments   Labs (all labs ordered are listed, but only abnormal results are displayed) Labs Reviewed  COMPREHENSIVE METABOLIC PANEL - Abnormal; Notable for the following components:      Result Value   Sodium 132 (*)    Potassium 3.3 (*)    Chloride 94 (*)    CO2 21 (*)    Glucose, Bld 259 (*)    Creatinine, Ser 1.50 (*)    Albumin 3.3 (*)    Total Bilirubin 1.4 (*)    GFR calc non Af Amer 45 (*)    GFR calc Af Amer 52 (*)    Anion gap 17 (*)    All other components within normal limits  CBC WITH DIFFERENTIAL/PLATELET - Abnormal; Notable for the following components:   WBC 16.7 (*)    Neutro Abs 15.3 (*)    Lymphs Abs 0.5 (*)    Abs Immature Granulocytes 0.12 (*)    All other components within normal limits  LACTIC ACID, PLASMA - Abnormal; Notable for the following components:   Lactic Acid,  Venous 3.0 (*)    All other components within normal limits  URINALYSIS, ROUTINE W REFLEX MICROSCOPIC - Abnormal; Notable for the following components:   Specific Gravity, Urine <1.005  (*)    Glucose, UA >=500 (*)    All other components within normal limits  URINALYSIS, MICROSCOPIC (REFLEX) - Abnormal; Notable for the following components:   Bacteria, UA RARE (*)    All other components within normal limits  LACTIC ACID, PLASMA - Abnormal; Notable for the following components:   Lactic Acid, Venous 2.0 (*)    All other components within normal limits  CBG MONITORING, ED - Abnormal; Notable for the following components:   Glucose-Capillary 223 (*)    All other components within normal limits  RESPIRATORY PANEL BY RT PCR (FLU A&B, COVID)  CULTURE, BLOOD (ROUTINE X 2)  CULTURE, BLOOD (ROUTINE X 2)  URINE CULTURE  LIPASE, BLOOD  TROPONIN I (HIGH SENSITIVITY)    EKG EKG Interpretation  Date/Time:  Monday November 09 2019 18:13:54 EDT Ventricular Rate:  108 PR Interval:    QRS Duration: 92 QT Interval:  352 QTC Calculation: 472 R Axis:   -19 Text Interpretation: Atrial fibrillation Multiple ventricular premature complexes Probable inferior infarct, age indeterminate Lateral leads are also involved A fib new from previous sinus rhythm Confirmed by Theotis Burrow [phone]) on 11/09/2019 6:18:48 PM   Radiology CT Abdomen Pelvis W Contrast  Result Date: 11/09/2019 CLINICAL DATA:  Bowel obstruction suspected vomiting since esophageal dilation, unable to tolerate PO Increasing weakness. EXAM: CT ABDOMEN AND PELVIS WITH CONTRAST TECHNIQUE: Multidetector CT imaging of the abdomen and pelvis was performed using the standard protocol following bolus administration of intravenous contrast. CONTRAST:  32mL OMNIPAQUE IOHEXOL 300 MG/ML  SOLN COMPARISON:  CT 11 days ago 10/29/2019 FINDINGS: Lower chest: Again seen subpleural fat at the lung bases. No pleural fluid. No lower pneumomediastinum or esophageal wall thickening. No focal consolidation. Hepatobiliary: Decreased hepatic density consistent with steatosis. Previous geographic fatty distribution is more homogeneous on the  current exam, likely related to differences in contrast timing. There is no discrete focal hepatic lesion. Layering gallstones without pericholecystic inflammation. There is no biliary dilatation. No visualized choledocholithiasis. Pancreas: Fatty atrophy.  No ductal dilatation or inflammation. Spleen: Normal in size without focal abnormality. Adrenals/Urinary Tract: Normal right adrenal gland. Punctate left adrenal calcification unchanged. No hydronephrosis or perinephric edema. Again seen thinning of bilateral renal parenchyma. There are bilateral renal cysts are unchanged since recent exam. The urinary bladder is distended. No bladder wall thickening. Bladder(volume = 1580 cm^3). Stomach/Bowel: No distal esophageal wall thickening. Stomach is decompressed. Normal positioning of the duodenum and ligament of Treitz. Decompressed small bowel without obstruction or inflammation. Normal appendix. Small volume of stool throughout the colon. Sigmoid colon is tortuous. Distal colonic diverticulosis without focal diverticulitis. There is no colonic wall thickening or inflammation. Vascular/Lymphatic: Aorto bi-iliac atherosclerosis without aortic aneurysm. The portal vein is patent. No acute vascular finding. No abdominopelvic adenopathy. Reproductive: Normal sized prostate gland, see details below regarding adjacent soft tissue air. Other: There is edema and soft tissue gas involving the left perineum extending to the base of the penis. Small perineal fluid collection measures 3.4 x 2.5 cm, series 2, image 87. Tracking soft tissue air extends superiorly along the inferior margin of the prostate gland. No abdominopelvic ascites. Musculoskeletal: Multilevel degenerative change throughout the spine. Degenerative change of the hips. No bony destruction or acute osseous abnormality. IMPRESSION: 1. No bowel obstruction. 2. Midline perineal abscess measuring 3.4 x  2.5 cm just below the base of the penis anterior to the rectum,  with patchy soft tissue air tracking in the left perineum, into the subcutaneous tissues. Findings highly suspicious for Fournier's gangrene. 3. Distended urinary bladder without bladder wall thickening. 4. Hepatic steatosis. Cholelithiasis without gallbladder inflammation. 5. Colonic diverticulosis without diverticulitis. Aortic Atherosclerosis (ICD10-I70.0). These results were called by telephone at the time of interpretation on 11/09/2019 at 9:17 pm to Dr Apolonio Schneiders Kaymen Adrian , who verbally acknowledged these results. Electronically Signed   By: Keith Rake M.D.   On: 11/09/2019 21:18    Procedures Procedures (including critical care time) CRITICAL CARE Performed by: Wenda Overland Gabino Hagin   Total critical care time: 60 minutes  Critical care time was exclusive of separately billable procedures and treating other patients.  Critical care was necessary to treat or prevent imminent or life-threatening deterioration.  Critical care was time spent personally by me on the following activities: development of treatment plan with patient and/or surrogate as well as nursing, discussions with consultants, evaluation of patient's response to treatment, examination of patient, obtaining history from patient or surrogate, ordering and performing treatments and interventions, ordering and review of laboratory studies, ordering and review of radiographic studies, pulse oximetry and re-evaluation of patient's condition.  Medications Ordered in ED Medications  vancomycin (VANCOCIN) IVPB 1000 mg/200 mL premix (1,000 mg Intravenous New Bag/Given 11/09/19 2233)  ceFEPIme (MAXIPIME) 2 g in sodium chloride 0.9 % 100 mL IVPB (has no administration in time range)  vancomycin (VANCOCIN) IVPB 1000 mg/200 mL premix (has no administration in time range)  insulin aspart (novoLOG) injection 0-20 Units (has no administration in time range)  lactated ringers bolus 1,000 mL (0 mLs Intravenous Stopped 11/09/19 2105)  iohexol  (OMNIPAQUE) 300 MG/ML solution 100 mL (80 mLs Intravenous Contrast Given 11/09/19 2041)  clindamycin (CLEOCIN) IVPB 900 mg (0 mg Intravenous Stopped 11/09/19 2147)  ceFEPIme (MAXIPIME) 2 g in sodium chloride 0.9 % 100 mL IVPB ( Intravenous Stopped 11/09/19 2217)    ED Course  I have reviewed the triage vital signs and the nursing notes.  Pertinent labs & imaging results that were available during my care of the patient were reviewed by me and considered in my medical decision making (see chart for details).    This patients CHA2DS2-VASc Score and unadjusted Ischemic Stroke Rate (% per year) is equal to 4.8 % stroke rate/year from a score of 4  Above score calculated as 1 point each if present [CHF, HTN, DM, Vascular=MI/PAD/Aortic Plaque, Age if 65-74, or Male] Above score calculated as 2 points each if present [Age > 75, or Stroke/TIA/TE]    Clinical Course as of Nov 10 2255  Tue Nov 10, 2019  0034 Spoke with Dr. Claudia Desanctis, urology and Dr. Johney Maine, general surgery.  Both will evaluate the patient for surgical management.  Dr. Johney Maine requesting insulin administration for uncontrolled hyperglycemia.  Additionally, Dr. Claudia Desanctis requesting hospitalist admission.  Will allow them to evaluate and then call hospitalist.   [CH]  0100 Patient being evaluated by Dr. Claudia Desanctis and Dr. Johney Maine.   [CH]  0139 Patient taken to the [facility] per nurse.  Plan was not relayed to me.  Unclear whether needs involvement of hospitalist.  They will need to call the hospitalist if they would like hospitalist admit or consult following the OR.   [CH]  0315 Spoke again with Dr. Johney Maine, have offered to contact hospitalist on his behalf.  Spoke with Dr. Cyd Silence has agreed to assess the patient for  admission.   [CH]    Clinical Course User Index [CH] Horton, Barbette Hair, MD   MDM Rules/Calculators/A&P                          Nontoxic on exam, mentating appropriately, normal BP, temp 99.7, very mildly tachycardic.  Lab work shows  potassium 3.3, glucose 259, creatinine 1.5, anion gap 17, WBC 16.7, lactate 3.  Given elevated lactate and leukocytosis, added blood cultures and obtained CT of abdomen and pelvis.  CT shows no acute problems with the bowels but he does have a perineal abscess with gas tracking in soft tissues and left perineum concerning for Fournier's gangrene.  Discussed findings with radiologist. Gave vanc, cefepime, and clindamycin in addition to IV fluids. Consulted urology and discussed with Dr. Claudia Desanctis, who will see pt in [facility] ED. She recommended foley catheter insertion. She also requested Gen surgery consult given that gas tracks posteriorly towards buttocks. Discussed w/ Dr. Johney Maine.   Spoke w/ Dr. Karle Starch in [facility] ED for ED to ED transfer.    Of note, EKG shows atrial fibrillation, denies any history of A. Fib and I do not see it on his records. His CHADSVASC score is 4 and he would likely be candidate for anticoagulation but will hold off pending surgical eval.  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

## 2019-11-09 NOTE — ED Notes (Signed)
Was recently seen by GI Endo MD for Upper GI and Esophageal Dilation

## 2019-11-09 NOTE — ED Notes (Signed)
INSTRUCTED TO REMAIN NPO UNTIL FURTHER NOTICE

## 2019-11-09 NOTE — ED Notes (Signed)
[facility] TRANSPORT TEAM AT BEDSIDE 

## 2019-11-09 NOTE — ED Triage Notes (Signed)
Per wife pt has had vomiting x 2 days and has had increasing weakness not able to eat or walk well

## 2019-11-09 NOTE — ED Notes (Signed)
Date and time results received: 11/09/19 2257  Test: lactic acid Critical Value: 2.0  Name of Provider Notified: Dr. Rex Kras  Orders Received? Or Actions Taken?: no new orders

## 2019-11-09 NOTE — Progress Notes (Signed)
Pharmacy Antibiotic Note  Gary Butler is a 74 y.o. male admitted on 11/09/2019 with sepsis likely secondary to cellulitis/fournier's gangrene.  Pharmacy has been consulted for Cefepime and vancomycin dosing. WBC elevated at 16.7. SCr 1.5. LA 3  Plan: -Start Cefepime 2 gm IV Q 8 hours -Vancomycin 2 gm IV load followed by vancomycin 1 gm IV Q 12 hours. Will aim for vanc trough of 15-20 mcg/mL  -Monitor CBC, renal fx, cultures and clinical progress -VT at SS      Temp (24hrs), Avg:99.7 F (37.6 C), Min:99.7 F (37.6 C), Max:99.7 F (37.6 C)  Recent Labs  Lab 11/09/19 1833  WBC 16.7*  CREATININE 1.50*  LATICACIDVEN 3.0*    Estimated Creatinine Clearance: 61.2 mL/min (A) (by C-G formula based on SCr of 1.5 mg/dL (H)).    Allergies  Allergen Reactions  . Lyrica [Pregabalin] Swelling  . Penicillins Swelling  . Levofloxacin Rash    Antimicrobials this admission: Cefepime 9/27 >>  Vanc 9/27 >>  Clinda 9/27 >>   Dose adjustments this admission:   Microbiology results: 9/27 BCx:  9/27 UCx:     Thank you for allowing pharmacy to be a part of this patient's care.  Albertina Parr, PharmD., BCPS, BCCCP Clinical Pharmacist Clinical phone for 11/09/19 until 11:30pm: [phone] If after 11:30pm, please refer to [facility] for unit-specific pharmacist

## 2019-11-10 ENCOUNTER — Inpatient Hospital Stay (HOSPITAL_COMMUNITY): Payer: Medicare PPO

## 2019-11-10 ENCOUNTER — Encounter (HOSPITAL_COMMUNITY)
Admission: EM | Disposition: A | Discharge status: Skilled Nursing Facility | Payer: Self-pay | Source: Home / Self Care | Attending: Internal Medicine

## 2019-11-10 ENCOUNTER — Emergency Department (HOSPITAL_COMMUNITY): Payer: Medicare PPO | Admitting: Registered Nurse

## 2019-11-10 ENCOUNTER — Encounter (HOSPITAL_COMMUNITY): Payer: Self-pay | Admitting: Internal Medicine

## 2019-11-10 DIAGNOSIS — Z1624 Resistance to multiple antibiotics: Secondary | ICD-10-CM | POA: Diagnosis present

## 2019-11-10 DIAGNOSIS — G4733 Obstructive sleep apnea (adult) (pediatric): Secondary | ICD-10-CM

## 2019-11-10 DIAGNOSIS — K219 Gastro-esophageal reflux disease without esophagitis: Secondary | ICD-10-CM | POA: Diagnosis present

## 2019-11-10 DIAGNOSIS — R197 Diarrhea, unspecified: Secondary | ICD-10-CM | POA: Diagnosis not present

## 2019-11-10 DIAGNOSIS — I10 Essential (primary) hypertension: Secondary | ICD-10-CM | POA: Diagnosis not present

## 2019-11-10 DIAGNOSIS — K611 Rectal abscess: Secondary | ICD-10-CM | POA: Diagnosis present

## 2019-11-10 DIAGNOSIS — E785 Hyperlipidemia, unspecified: Secondary | ICD-10-CM | POA: Diagnosis not present

## 2019-11-10 DIAGNOSIS — B372 Candidiasis of skin and nail: Secondary | ICD-10-CM | POA: Diagnosis not present

## 2019-11-10 DIAGNOSIS — A4151 Sepsis due to Escherichia coli [E. coli]: Secondary | ICD-10-CM | POA: Diagnosis present

## 2019-11-10 DIAGNOSIS — I4891 Unspecified atrial fibrillation: Secondary | ICD-10-CM | POA: Diagnosis present

## 2019-11-10 DIAGNOSIS — I480 Paroxysmal atrial fibrillation: Secondary | ICD-10-CM | POA: Diagnosis not present

## 2019-11-10 DIAGNOSIS — I110 Hypertensive heart disease with heart failure: Secondary | ICD-10-CM | POA: Diagnosis present

## 2019-11-10 DIAGNOSIS — E1165 Type 2 diabetes mellitus with hyperglycemia: Secondary | ICD-10-CM | POA: Diagnosis present

## 2019-11-10 DIAGNOSIS — Z794 Long term (current) use of insulin: Secondary | ICD-10-CM

## 2019-11-10 DIAGNOSIS — Z825 Family history of asthma and other chronic lower respiratory diseases: Secondary | ICD-10-CM | POA: Diagnosis not present

## 2019-11-10 DIAGNOSIS — N390 Urinary tract infection, site not specified: Secondary | ICD-10-CM | POA: Diagnosis present

## 2019-11-10 DIAGNOSIS — M79609 Pain in unspecified limb: Secondary | ICD-10-CM | POA: Diagnosis not present

## 2019-11-10 DIAGNOSIS — N179 Acute kidney failure, unspecified: Secondary | ICD-10-CM | POA: Diagnosis present

## 2019-11-10 DIAGNOSIS — M1990 Unspecified osteoarthritis, unspecified site: Secondary | ICD-10-CM | POA: Diagnosis present

## 2019-11-10 DIAGNOSIS — Z20822 Contact with and (suspected) exposure to covid-19: Secondary | ICD-10-CM | POA: Diagnosis not present

## 2019-11-10 DIAGNOSIS — E860 Dehydration: Secondary | ICD-10-CM | POA: Diagnosis present

## 2019-11-10 DIAGNOSIS — Z8261 Family history of arthritis: Secondary | ICD-10-CM | POA: Diagnosis not present

## 2019-11-10 DIAGNOSIS — Z8249 Family history of ischemic heart disease and other diseases of the circulatory system: Secondary | ICD-10-CM | POA: Diagnosis not present

## 2019-11-10 DIAGNOSIS — E1140 Type 2 diabetes mellitus with diabetic neuropathy, unspecified: Secondary | ICD-10-CM | POA: Diagnosis not present

## 2019-11-10 DIAGNOSIS — L02215 Cutaneous abscess of perineum: Secondary | ICD-10-CM

## 2019-11-10 DIAGNOSIS — R338 Other retention of urine: Secondary | ICD-10-CM

## 2019-11-10 DIAGNOSIS — Z23 Encounter for immunization: Secondary | ICD-10-CM | POA: Diagnosis not present

## 2019-11-10 DIAGNOSIS — I5032 Chronic diastolic (congestive) heart failure: Secondary | ICD-10-CM | POA: Diagnosis not present

## 2019-11-10 DIAGNOSIS — Z6841 Body Mass Index (BMI) 40.0 and over, adult: Secondary | ICD-10-CM | POA: Diagnosis not present

## 2019-11-10 DIAGNOSIS — K649 Unspecified hemorrhoids: Secondary | ICD-10-CM | POA: Diagnosis present

## 2019-11-10 DIAGNOSIS — F0390 Unspecified dementia without behavioral disturbance: Secondary | ICD-10-CM | POA: Diagnosis not present

## 2019-11-10 LAB — GLUCOSE, CAPILLARY
Glucose-Capillary: 196 mg/dL — ABNORMAL HIGH (ref 70–99)
Glucose-Capillary: 190 mg/dL — ABNORMAL HIGH (ref 70–99)
Glucose-Capillary: 211 mg/dL — ABNORMAL HIGH (ref 70–99)
Glucose-Capillary: 218 mg/dL — ABNORMAL HIGH (ref 70–99)
Glucose-Capillary: 185 mg/dL — ABNORMAL HIGH (ref 70–99)

## 2019-11-10 LAB — CBC WITH DIFFERENTIAL/PLATELET
Abs Immature Granulocytes: 0.12 10*3/uL — ABNORMAL HIGH (ref 0.00–0.07)
Basophils Absolute: 0 10*3/uL (ref 0.0–0.1)
Basophils Relative: 0 %
Eosinophils Absolute: 0 10*3/uL (ref 0.0–0.5)
Eosinophils Relative: 0 %
HCT: 42.3 % (ref 39.0–52.0)
Hemoglobin: 14.3 g/dL (ref 13.0–17.0)
Immature Granulocytes: 1 %
Lymphocytes Relative: 3 %
Lymphs Abs: 0.4 10*3/uL — ABNORMAL LOW (ref 0.7–4.0)
MCH: 31.7 pg (ref 26.0–34.0)
MCHC: 33.8 g/dL (ref 30.0–36.0)
MCV: 93.8 fL (ref 80.0–100.0)
Monocytes Absolute: 0.5 10*3/uL (ref 0.1–1.0)
Monocytes Relative: 3 %
Neutro Abs: 15.3 10*3/uL — ABNORMAL HIGH (ref 1.7–7.7)
Neutrophils Relative %: 93 %
Platelets: 223 10*3/uL (ref 150–400)
RBC: 4.51 MIL/uL (ref 4.22–5.81)
RDW: 15.7 % — ABNORMAL HIGH (ref 11.5–15.5)
WBC: 16.3 10*3/uL — ABNORMAL HIGH (ref 4.0–10.5)
nRBC: 0 % (ref 0.0–0.2)

## 2019-11-10 LAB — COMPREHENSIVE METABOLIC PANEL
ALT: 14 U/L (ref 0–44)
AST: 16 U/L (ref 15–41)
Albumin: 2.7 g/dL — ABNORMAL LOW (ref 3.5–5.0)
Alkaline Phosphatase: 74 U/L (ref 38–126)
Anion gap: 11 (ref 5–15)
BUN: 18 mg/dL (ref 8–23)
CO2: 23 mmol/L (ref 22–32)
Calcium: 8.7 mg/dL — ABNORMAL LOW (ref 8.9–10.3)
Chloride: 96 mmol/L — ABNORMAL LOW (ref 98–111)
Creatinine, Ser: 1.22 mg/dL (ref 0.61–1.24)
GFR calc Af Amer: >60 mL/min (ref >60)
GFR calc non Af Amer: 58 mL/min — ABNORMAL LOW (ref >60)
Glucose, Bld: 231 mg/dL — ABNORMAL HIGH (ref 70–99)
Potassium: 3.3 mmol/L — ABNORMAL LOW (ref 3.5–5.1)
Sodium: 130 mmol/L — ABNORMAL LOW (ref 135–145)
Total Bilirubin: 1.4 mg/dL — ABNORMAL HIGH (ref 0.3–1.2)
Total Protein: 5.8 g/dL — ABNORMAL LOW (ref 6.5–8.1)

## 2019-11-10 LAB — MAGNESIUM: Magnesium: 1.8 mg/dL (ref 1.7–2.4)

## 2019-11-10 LAB — TSH: TSH: 1.141 u[IU]/mL (ref 0.350–4.500)

## 2019-11-10 LAB — CBG MONITORING, ED: Glucose-Capillary: 255 mg/dL — ABNORMAL HIGH (ref 70–99)

## 2019-11-10 LAB — TROPONIN I (HIGH SENSITIVITY): Troponin I (High Sensitivity): 15 ng/L (ref <18)

## 2019-11-10 LAB — MRSA PCR SCREENING: MRSA by PCR: NEGATIVE

## 2019-11-10 SURGERY — IRRIGATION AND DEBRIDEMENT ABSCESS
Anesthesia: General | Site: Scrotum

## 2019-11-10 MED ORDER — TIZANIDINE HCL 4 MG PO TABS: 4.0000 mg | ORAL_TABLET | Freq: Four times a day (QID) | ORAL | Status: DC | PRN

## 2019-11-10 MED ORDER — LORATADINE 10 MG PO TABS
10.0000 mg | ORAL_TABLET | Freq: Every day | ORAL | Status: DC
  Administered 2019-11-10 – 2019-11-17 (×8): 10 mg via ORAL
  Filled 2019-11-10 (×8): qty 1

## 2019-11-10 MED ORDER — SODIUM CHLORIDE 0.9% FLUSH
3.0000 mL | Freq: Two times a day (BID) | INTRAVENOUS | Status: DC
  Administered 2019-11-10 – 2019-11-17 (×10): 3 mL via INTRAVENOUS

## 2019-11-10 MED ORDER — ENALAPRILAT 1.25 MG/ML IV SOLN
0.6250 mg | Freq: Four times a day (QID) | INTRAVENOUS | Status: DC | PRN
  Filled 2019-11-10: qty 1

## 2019-11-10 MED ORDER — INSULIN ASPART 100 UNIT/ML ~~LOC~~ SOLN
0.0000 [IU] | Freq: Three times a day (TID) | SUBCUTANEOUS | Status: DC
  Administered 2019-11-10: 7 [IU] via SUBCUTANEOUS
  Administered 2019-11-10: 4 [IU] via SUBCUTANEOUS
  Administered 2019-11-11: 3 [IU] via SUBCUTANEOUS
  Administered 2019-11-11: 7 [IU] via SUBCUTANEOUS
  Administered 2019-11-11 (×2): 4 [IU] via SUBCUTANEOUS
  Administered 2019-11-12 (×2): 3 [IU] via SUBCUTANEOUS
  Administered 2019-11-12: 4 [IU] via SUBCUTANEOUS
  Administered 2019-11-12: 3 [IU] via SUBCUTANEOUS
  Administered 2019-11-13: 4 [IU] via SUBCUTANEOUS
  Administered 2019-11-13: 7 [IU] via SUBCUTANEOUS
  Administered 2019-11-13: 4 [IU] via SUBCUTANEOUS
  Administered 2019-11-14 – 2019-11-15 (×5): 3 [IU] via SUBCUTANEOUS
  Administered 2019-11-15 – 2019-11-16 (×3): 4 [IU] via SUBCUTANEOUS
  Administered 2019-11-17 (×2): 3 [IU] via SUBCUTANEOUS

## 2019-11-10 MED ORDER — ACETAMINOPHEN 500 MG PO TABS
1000.0000 mg | ORAL_TABLET | Freq: Three times a day (TID) | ORAL | Status: DC
  Administered 2019-11-10 – 2019-11-17 (×21): 1000 mg via ORAL
  Filled 2019-11-10 (×21): qty 2

## 2019-11-10 MED ORDER — PROPOFOL 10 MG/ML IV BOLUS
INTRAVENOUS | Status: AC
  Filled 2019-11-10: qty 20

## 2019-11-10 MED ORDER — SUCCINYLCHOLINE CHLORIDE 200 MG/10ML IV SOSY
PREFILLED_SYRINGE | INTRAVENOUS | Status: DC | PRN
  Administered 2019-11-10: 180 mg via INTRAVENOUS

## 2019-11-10 MED ORDER — 0.9 % SODIUM CHLORIDE (POUR BTL) OPTIME
TOPICAL | Status: DC | PRN
  Administered 2019-11-10: 1000 mL

## 2019-11-10 MED ORDER — FLUCONAZOLE 200 MG PO TABS
200.0000 mg | ORAL_TABLET | Freq: Every day | ORAL | Status: AC
  Administered 2019-11-10 – 2019-11-12 (×3): 200 mg via ORAL
  Filled 2019-11-10: qty 2
  Filled 2019-11-10 (×3): qty 1
  Filled 2019-11-10 (×2): qty 2

## 2019-11-10 MED ORDER — FOLIC ACID 1 MG PO TABS
1.0000 mg | ORAL_TABLET | Freq: Every day | ORAL | Status: DC
  Administered 2019-11-10 – 2019-11-17 (×8): 1 mg via ORAL
  Filled 2019-11-10 (×8): qty 1

## 2019-11-10 MED ORDER — TRIAMTERENE-HCTZ 75-50 MG PO TABS
0.5000 | ORAL_TABLET | Freq: Every morning | ORAL | Status: DC
  Administered 2019-11-11: 0.5 via ORAL
  Filled 2019-11-10 (×2): qty 0.5

## 2019-11-10 MED ORDER — INFLUENZA VAC A&B SA ADJ QUAD 0.5 ML IM PRSY
0.5000 mL | PREFILLED_SYRINGE | INTRAMUSCULAR | Status: AC
  Administered 2019-11-11: 0.5 mL via INTRAMUSCULAR
  Filled 2019-11-10: qty 0.5

## 2019-11-10 MED ORDER — MEPERIDINE HCL 50 MG/ML IJ SOLN: 6.2500 mg | INTRAMUSCULAR | Status: DC | PRN

## 2019-11-10 MED ORDER — HYDROMORPHONE HCL 1 MG/ML IJ SOLN: 0.2500 mg | INTRAMUSCULAR | Status: DC | PRN

## 2019-11-10 MED ORDER — FENTANYL CITRATE (PF) 100 MCG/2ML IJ SOLN
INTRAMUSCULAR | Status: DC | PRN
Start: 2019-11-10 — End: 2019-11-10
  Administered 2019-11-10: 100 ug via INTRAVENOUS

## 2019-11-10 MED ORDER — LIDOCAINE 2% (20 MG/ML) 5 ML SYRINGE
INTRAMUSCULAR | Status: DC | PRN
  Administered 2019-11-10: 1.5 mg/kg/h via INTRAVENOUS

## 2019-11-10 MED ORDER — CLINDAMYCIN PHOSPHATE 900 MG/50ML IV SOLN: 900.0000 mg | INTRAVENOUS | Status: DC

## 2019-11-10 MED ORDER — DEXAMETHASONE SODIUM PHOSPHATE 10 MG/ML IJ SOLN
INTRAMUSCULAR | Status: AC
  Filled 2019-11-10: qty 1

## 2019-11-10 MED ORDER — BISOPROLOL FUMARATE 5 MG PO TABS
5.0000 mg | ORAL_TABLET | Freq: Every day | ORAL | Status: DC
  Administered 2019-11-10 – 2019-11-12 (×3): 5 mg via ORAL
  Filled 2019-11-10 (×4): qty 1

## 2019-11-10 MED ORDER — ALBUTEROL SULFATE HFA 108 (90 BASE) MCG/ACT IN AERS
2.0000 | INHALATION_SPRAY | Freq: Four times a day (QID) | RESPIRATORY_TRACT | Status: DC | PRN
  Filled 2019-11-10: qty 6.7

## 2019-11-10 MED ORDER — FENTANYL CITRATE (PF) 250 MCG/5ML IJ SOLN
INTRAMUSCULAR | Status: AC
  Filled 2019-11-10: qty 5

## 2019-11-10 MED ORDER — PSYLLIUM 95 % PO PACK
1.0000 | PACK | Freq: Two times a day (BID) | ORAL | Status: DC
  Administered 2019-11-10 – 2019-11-17 (×9): 1 via ORAL
  Filled 2019-11-10 (×13): qty 1

## 2019-11-10 MED ORDER — NYSTATIN 100000 UNIT/GM EX CREA
TOPICAL_CREAM | Freq: Two times a day (BID) | CUTANEOUS | Status: DC
  Filled 2019-11-10: qty 15

## 2019-11-10 MED ORDER — MAGIC MOUTHWASH
15.0000 mL | Freq: Four times a day (QID) | ORAL | Status: DC | PRN
  Filled 2019-11-10: qty 15

## 2019-11-10 MED ORDER — OXYCODONE HCL 5 MG PO TABS
5.0000 mg | ORAL_TABLET | ORAL | Status: DC | PRN
  Administered 2019-11-10 – 2019-11-14 (×8): 10 mg via ORAL
  Filled 2019-11-10 (×8): qty 2

## 2019-11-10 MED ORDER — ONDANSETRON HCL 4 MG/2ML IJ SOLN
4.0000 mg | Freq: Once | INTRAMUSCULAR | Status: AC
  Administered 2019-11-10: 4 mg via INTRAVENOUS
  Filled 2019-11-10: qty 2

## 2019-11-10 MED ORDER — INSULIN ASPART 100 UNIT/ML ~~LOC~~ SOLN
SUBCUTANEOUS | Status: AC
  Filled 2019-11-10: qty 1

## 2019-11-10 MED ORDER — LIP MEDEX EX OINT
1.0000 "application " | TOPICAL_OINTMENT | Freq: Two times a day (BID) | CUTANEOUS | Status: DC
  Administered 2019-11-10 – 2019-11-17 (×11): 1 via TOPICAL
  Filled 2019-11-10 (×3): qty 7

## 2019-11-10 MED ORDER — METRONIDAZOLE IN NACL 5-0.79 MG/ML-% IV SOLN
500.0000 mg | Freq: Three times a day (TID) | INTRAVENOUS | Status: DC
  Administered 2019-11-10 – 2019-11-13 (×10): 500 mg via INTRAVENOUS
  Filled 2019-11-10 (×10): qty 100

## 2019-11-10 MED ORDER — EMPAGLIFLOZIN 10 MG PO TABS: 10.0000 mg | ORAL_TABLET | Freq: Every day | ORAL | Status: DC

## 2019-11-10 MED ORDER — ONDANSETRON HCL 4 MG/2ML IJ SOLN
INTRAMUSCULAR | Status: AC
  Filled 2019-11-10: qty 2

## 2019-11-10 MED ORDER — FLUTICASONE PROPIONATE 50 MCG/ACT NA SUSP
2.0000 | Freq: Every day | NASAL | Status: DC
  Administered 2019-11-11 – 2019-11-17 (×7): 2 via NASAL
  Filled 2019-11-10: qty 16

## 2019-11-10 MED ORDER — CHLORHEXIDINE GLUCONATE CLOTH 2 % EX PADS: 6.0000 | MEDICATED_PAD | Freq: Once | CUTANEOUS | Status: DC

## 2019-11-10 MED ORDER — ATORVASTATIN CALCIUM 40 MG PO TABS: 40.0000 mg | ORAL_TABLET | Freq: Every day | ORAL | Status: DC

## 2019-11-10 MED ORDER — VITAMIN D3 25 MCG (1000 UNIT) PO TABS
2000.0000 [IU] | ORAL_TABLET | Freq: Every day | ORAL | Status: DC
  Administered 2019-11-10 – 2019-11-17 (×8): 2000 [IU] via ORAL
  Filled 2019-11-10 (×11): qty 2

## 2019-11-10 MED ORDER — PHENYLEPHRINE HCL-NACL 10-0.9 MG/250ML-% IV SOLN
INTRAVENOUS | Status: DC | PRN
  Administered 2019-11-10: 5 ug/min via INTRAVENOUS

## 2019-11-10 MED ORDER — LOPERAMIDE HCL 2 MG PO CAPS: 2.0000 mg | ORAL_CAPSULE | Freq: Three times a day (TID) | ORAL | Status: DC | PRN

## 2019-11-10 MED ORDER — ASPIRIN EC 81 MG PO TBEC
81.0000 mg | DELAYED_RELEASE_TABLET | Freq: Every day | ORAL | Status: DC
  Administered 2019-11-11 – 2019-11-17 (×7): 81 mg via ORAL
  Filled 2019-11-10 (×7): qty 1

## 2019-11-10 MED ORDER — ONDANSETRON HCL 4 MG/2ML IJ SOLN: 4.0000 mg | Freq: Four times a day (QID) | INTRAMUSCULAR | Status: DC | PRN

## 2019-11-10 MED ORDER — POLYETHYLENE GLYCOL 3350 17 G PO PACK: 17.0000 g | PACK | Freq: Every day | ORAL | Status: DC | PRN

## 2019-11-10 MED ORDER — MIDAZOLAM HCL 2 MG/2ML IJ SOLN
INTRAMUSCULAR | Status: AC
  Filled 2019-11-10: qty 2

## 2019-11-10 MED ORDER — LACTATED RINGERS IV SOLN: INTRAVENOUS | Status: DC | PRN

## 2019-11-10 MED ORDER — BUPIVACAINE-EPINEPHRINE 0.25% -1:200000 IJ SOLN
INTRAMUSCULAR | Status: DC | PRN
  Administered 2019-11-10: 20 mL

## 2019-11-10 MED ORDER — GABAPENTIN 300 MG PO CAPS: 300.0000 mg | ORAL_CAPSULE | ORAL | Status: DC

## 2019-11-10 MED ORDER — FENTANYL CITRATE (PF) 100 MCG/2ML IJ SOLN
50.0000 ug | Freq: Once | INTRAMUSCULAR | Status: AC
  Administered 2019-11-10: 50 ug via INTRAVENOUS
  Filled 2019-11-10: qty 2

## 2019-11-10 MED ORDER — BUPIVACAINE-EPINEPHRINE (PF) 0.25% -1:200000 IJ SOLN
INTRAMUSCULAR | Status: AC
  Filled 2019-11-10: qty 30

## 2019-11-10 MED ORDER — MIDAZOLAM HCL 5 MG/5ML IJ SOLN
INTRAMUSCULAR | Status: DC | PRN
  Administered 2019-11-10: 1 mg via INTRAVENOUS

## 2019-11-10 MED ORDER — SODIUM CHLORIDE 0.9% FLUSH: 3.0000 mL | INTRAVENOUS | Status: DC | PRN

## 2019-11-10 MED ORDER — ACETAMINOPHEN 325 MG PO TABS
325.0000 mg | ORAL_TABLET | Freq: Four times a day (QID) | ORAL | Status: DC | PRN
  Filled 2019-11-10: qty 2

## 2019-11-10 MED ORDER — PHENYLEPHRINE HCL (PRESSORS) 10 MG/ML IV SOLN
INTRAVENOUS | Status: DC | PRN
  Administered 2019-11-10: 120 ug via INTRAVENOUS
  Administered 2019-11-10: 80 ug via INTRAVENOUS

## 2019-11-10 MED ORDER — FENTANYL CITRATE (PF) 100 MCG/2ML IJ SOLN: 25.0000 ug | INTRAMUSCULAR | Status: DC | PRN

## 2019-11-10 MED ORDER — LIDOCAINE 2% (20 MG/ML) 5 ML SYRINGE
INTRAMUSCULAR | Status: DC | PRN
  Administered 2019-11-10: 75 mg via INTRAVENOUS

## 2019-11-10 MED ORDER — VITAMIN B-12 1000 MCG PO TABS
2000.0000 ug | ORAL_TABLET | Freq: Every day | ORAL | Status: DC
  Administered 2019-11-10 – 2019-11-17 (×8): 2000 ug via ORAL
  Filled 2019-11-10 (×8): qty 2

## 2019-11-10 MED ORDER — ONDANSETRON HCL 4 MG PO TABS: 4.0000 mg | ORAL_TABLET | Freq: Four times a day (QID) | ORAL | Status: DC | PRN

## 2019-11-10 MED ORDER — SERTRALINE HCL 50 MG PO TABS
50.0000 mg | ORAL_TABLET | Freq: Every day | ORAL | Status: DC
  Administered 2019-11-10 – 2019-11-17 (×8): 50 mg via ORAL
  Filled 2019-11-10 (×8): qty 1

## 2019-11-10 MED ORDER — ALLOPURINOL 300 MG PO TABS
300.0000 mg | ORAL_TABLET | Freq: Every day | ORAL | Status: DC
  Administered 2019-11-10 – 2019-11-17 (×8): 300 mg via ORAL
  Filled 2019-11-10 (×8): qty 1

## 2019-11-10 MED ORDER — ACETAMINOPHEN 500 MG PO TABS: 1000.0000 mg | ORAL_TABLET | ORAL | Status: DC

## 2019-11-10 MED ORDER — ROCURONIUM BROMIDE 10 MG/ML (PF) SYRINGE
PREFILLED_SYRINGE | INTRAVENOUS | Status: DC | PRN
  Administered 2019-11-10: 5 mg via INTRAVENOUS

## 2019-11-10 MED ORDER — BUPIVACAINE-EPINEPHRINE 0.25% -1:200000 IJ SOLN
INTRAMUSCULAR | Status: AC
  Filled 2019-11-10: qty 1

## 2019-11-10 MED ORDER — METOPROLOL TARTRATE 5 MG/5ML IV SOLN: 5.0000 mg | Freq: Four times a day (QID) | INTRAVENOUS | Status: DC | PRN

## 2019-11-10 MED ORDER — PROMETHAZINE HCL 25 MG/ML IJ SOLN: 6.2500 mg | INTRAMUSCULAR | Status: DC | PRN

## 2019-11-10 MED ORDER — DEXAMETHASONE SODIUM PHOSPHATE 10 MG/ML IJ SOLN
INTRAMUSCULAR | Status: DC | PRN
  Administered 2019-11-10: 10 mg via INTRAVENOUS

## 2019-11-10 MED ORDER — SODIUM CHLORIDE 0.9 % IV SOLN
250.0000 mL | INTRAVENOUS | Status: DC | PRN
  Administered 2019-11-11: 250 mL via INTRAVENOUS

## 2019-11-10 MED ORDER — PANTOPRAZOLE SODIUM 40 MG PO TBEC
40.0000 mg | DELAYED_RELEASE_TABLET | Freq: Every day | ORAL | Status: DC
  Administered 2019-11-10 – 2019-11-17 (×8): 40 mg via ORAL
  Filled 2019-11-10 (×8): qty 1

## 2019-11-10 MED ORDER — DIPHENHYDRAMINE HCL 50 MG/ML IJ SOLN: 12.5000 mg | Freq: Four times a day (QID) | INTRAMUSCULAR | Status: DC | PRN

## 2019-11-10 MED ORDER — DONEPEZIL HCL 10 MG PO TABS
10.0000 mg | ORAL_TABLET | Freq: Every day | ORAL | Status: DC
  Administered 2019-11-10 – 2019-11-16 (×7): 10 mg via ORAL
  Filled 2019-11-10 (×7): qty 1

## 2019-11-10 MED ORDER — PROPOFOL 10 MG/ML IV BOLUS
INTRAVENOUS | Status: DC | PRN
  Administered 2019-11-10: 200 mg via INTRAVENOUS

## 2019-11-10 MED ORDER — METHOCARBAMOL 1000 MG/10ML IJ SOLN
1000.0000 mg | Freq: Four times a day (QID) | INTRAVENOUS | Status: DC | PRN
  Filled 2019-11-10: qty 10

## 2019-11-10 SURGICAL SUPPLY — 30 items
BLADE SURG 15 STRL LF DISP TIS (BLADE) ×2 IMPLANT
BLADE SURG 15 STRL SS (BLADE) ×3
BNDG CONFORM 3 STRL LF (GAUZE/BANDAGES/DRESSINGS) ×2 IMPLANT
BRIEF STRETCH FOR OB PAD LRG (UNDERPADS AND DIAPERS) ×2 IMPLANT
CNTNR URN SCR LID CUP LEK RST (MISCELLANEOUS) ×4 IMPLANT
CONT SPEC 4OZ STRL OR WHT (MISCELLANEOUS) ×6
COVER SURGICAL LIGHT HANDLE (MISCELLANEOUS) ×3 IMPLANT
DRAPE LAPAROTOMY T 102X78X121 (DRAPES) ×3 IMPLANT
DRSG PAD ABDOMINAL 8X10 ST (GAUZE/BANDAGES/DRESSINGS) ×9 IMPLANT
ELECT REM PT RETURN 15FT ADLT (MISCELLANEOUS) ×3 IMPLANT
GAUZE 4X4 16PLY RFD (DISPOSABLE) ×3 IMPLANT
GAUZE SPONGE 4X4 12PLY STRL (GAUZE/BANDAGES/DRESSINGS) ×3 IMPLANT
GLOVE ECLIPSE 8.0 STRL XLNG CF (GLOVE) ×6 IMPLANT
GLOVE INDICATOR 8.0 STRL GRN (GLOVE) ×6 IMPLANT
GOWN STRL REUS W/TWL XL LVL3 (GOWN DISPOSABLE) ×6 IMPLANT
KIT BASIN OR (CUSTOM PROCEDURE TRAY) ×3 IMPLANT
KIT TURNOVER KIT A (KITS) ×3 IMPLANT
NDL SAFETY ECLIPSE 18X1.5 (NEEDLE) ×2 IMPLANT
NEEDLE HYPO 18GX1.5 SHARP (NEEDLE) ×3
NEEDLE HYPO 22GX1.5 SAFETY (NEEDLE) ×3 IMPLANT
PACK BASIC VI WITH GOWN DISP (CUSTOM PROCEDURE TRAY) ×3 IMPLANT
PADDING UNDERCAST 2 STRL (CAST SUPPLIES) ×1
PADDING UNDERCAST 2X4 STRL (CAST SUPPLIES) ×2 IMPLANT
PENCIL SMOKE EVACUATOR (MISCELLANEOUS) ×3 IMPLANT
SURGILUBE 2OZ TUBE FLIPTOP (MISCELLANEOUS) ×3 IMPLANT
SYR 20ML LL LF (SYRINGE) ×3 IMPLANT
SYR BULB IRRIG 60ML STRL (SYRINGE) ×3 IMPLANT
TOWEL OR 17X26 10 PK STRL BLUE (TOWEL DISPOSABLE) ×3 IMPLANT
TOWEL OR NON WOVEN STRL DISP B (DISPOSABLE) ×3 IMPLANT
YANKAUER SUCT BULB TIP 10FT TU (MISCELLANEOUS) ×3 IMPLANT

## 2019-11-10 NOTE — Progress Notes (Signed)
Dr Karleen Hampshire aware post op EKG reads as NSR.  Pt to be transferred to med-surg, no Tele.

## 2019-11-10 NOTE — Op Note (Signed)
11/10/2019  3:04 AM  PATIENT:  Gary Butler  74 y.o. male  Patient Care Team: [name], MD as PCP - General (Family Medicine) [place], Jennefer Bravo, MD as Referring Physician (Dermatology) Sharyne Peach, MD as Consulting Physician (Ophthalmology) Hennie Duos, MD as Consulting Physician (Rheumatology) Kathie Rhodes, MD as Consulting Physician (Urology) Tanda Rockers, MD as Consulting Physician (Pulmonary Disease) Troy Sine, MD as Consulting Physician (Cardiology) Paralee Cancel, MD as Consulting Physician (Orthopedic Surgery) Cameron Sprang, MD as Consulting Physician (Neurology) Marzetta Board, DPM as Consulting Physician (Podiatry) Renelda Mom, MD as Referring Physician (Colon and Rectal Surgery) Richmond Campbell, MD as Consulting Physician (Gastroenterology) Rolene Course, PA-C as Physician Assistant (Internal Medicine)  PRE-OPERATIVE DIAGNOSIS:  HIGH PERINEAL ABSCESS  POST-OPERATIVE DIAGNOSIS:  SUPRASPHINCTERIC HIGH PERIRECTAL AABSCESS  PROCEDURE:   INCISION & DRAINAGE OF HIGH PERIRECTAL ABSCESS ANORECTAL EXAMINATION UNDER ANESTHESIA  SURGEON:  Adin Hector, MD  ASSISTANT: OR Staff   ANESTHESIA:   local and general  EBL:  Total I/O In: 100 [IV Piggyback:100] Out: 3000 [Urine:3000]  Delay start of Pharmacological VTE agent (>24hrs) due to surgical blood loss or risk of bleeding:  no  DRAINS: none   SPECIMEN:  Perirectal abscess  DISPOSITION OF SPECIMEN:  Microbiology  COUNTS:  YES  PLAN OF CARE: Admit to inpatient   PATIENT DISPOSITION:  PACU - hemodynamically stable.  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  Developed worsening diarrhea.  Seen by primary care and gastroenterology.  Worsening pain.  CT scan showed hide abscess in the perineum.  Recommendation made for operative exploration with incision and drainage  The anatomy  and physiology of skin abscesses was discussed. Pathophysiology of SQ abscess, possible progression to fasciitis & sepsis, etc discussed . I stressed good hygiene & wound care. Possible redebridement was discussed as well.   Possibility of recurrence was discussed. Risks, benefits, alternatives were discussed. I noted a good likelihood this will help address the problem. Risks of anesthesia and other risks discussed. Questions answered. The patient is does wish to proceed.   OR FINDINGS: High riding abscess in upper perineum and left anterior aspect.  Riding on left anterior rectal wall.  Suspicious for supra sphincteric perirectal abscess.  10cm deep abscess  Wound measures 5cm by 5cm.  It is 10cm deep  CASE DATA:  Type of patient?: LDOW CASE (Surgical Hospitalist [facility] Inpatient)  Status of Case? EMERGENT Add On  Infection Present At Time Of Surgery (PATOS)?  ABSCESS, PURULENCE  DESCRIPTION:   Informed consent was confirmed. The patient received IV antibiotics. The patient underwent general anesthesia without any difficulty. The patient was positioned in HIGHlithotomy. SCDs were active during the entire case. The area around the abscess was prepped and draped in a sterile fashion. A surgical timeout confirmed our plan.   I used an 18-gauge needle to aspirate in the left side of the perineum.  Eventually encountered some purulence.  I made an incision over the most fluctuant area of the mass in the left perineum..   I placed my finger into the abscess cavity to break up loculations.  At the tip of my finger I could break into an obvious purulent pus pocket.  Culture sent for aerobic anaerobic.  I could do rectal examination.  Patient had thing at thinning and scarring in the left anterior aspect.  This seemed to be the most likely location of his prior fistulotomy.  I saw no scars posteriorly or left lateral or on the right side.  At the tip of my finger I could feel the abscess cavity laying on  the left anterior rectal wall.  Seem consistent with a suprasphincteric perirectal abscess.  Used my finger to break up the abscess cavity.  7 x 6 cm region.  The incision was extended to adequately expose the entire cavity.    We did copious irrigation. The fascia was viable.  I sharply debrided out some necrotic fat with scalpel and scissors.   I did sharp debridement of skin with a scalpel to allow a 5cm by 5cm open wound. We took extra care to ensure hemostasis.   Anorectal examination noticed right posterior and left lateral grade 2 swollen hemorrhoids.  No thrombosis.  No posterior midline fissure.  Could feel no other rectal wall abnormalities.  The wound was packed with antibiotic-soaked 3" Kling rolled gauze.  Sterile dressings applied.  Patient is being extubated go to recovery room.   We plan to continue IV antibiotics and begin wound care training tomorrow.  I discussed operative findings, updated the patient's status, discussed probable steps to recovery, and gave postoperative recommendations to the patient's spouse, Gary Butler.  Recommendations were made.  Questions were answered.  She expressed understanding & appreciation.  Adin Hector, M.D., F.A.C.S. Gastrointestinal and Minimally Invasive Surgery [facility] [address] [place], [place] [address] [phone] Main / Paging

## 2019-11-10 NOTE — Progress Notes (Signed)
73 year old male prior history of hypertension hyperlipidemia, diabetes, early dementia with mild cognitive delay), GERD has a history of left-sided perianal fistula status post fistulotomy performed at [facility] in 2019 presents to [place] with complaints of frequent bouts of watery diarrhea and rectal pain over the last 6 weeks.  Patient also reports that he underwent EGD about a week ago underwent esophageal dilatation. On arrival to [place] he underwent CT imaging of the abdomen and pelvis revealed evidence of abscess formation of 3.4 into 2.5 cm .  He was transferred to [facility] for further evaluation by surgery and urology. Patient was taken to the perineal abscess and [facility] was requested to admit the patient for medical management of the infection.   Patient was admitted earlier this morning by Dr. Cyd Silence please see his detailed note of H&P   Patient seen and examined at bedside.  Postop from incision and drainage of the supra sphincteric perirectal abscess Plan to continue with IV antibiotics and await culture report. Pain control.   Hosie Poisson MD

## 2019-11-10 NOTE — Consult Note (Signed)
I have been asked to see the patient by Dr. Thayer Jew, for evaluation and management of perineal abscess.  History of present illness:  74 yo man presented to [facility] ED with 8 weeks of diarrhea and back pain.  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   Patient has DM and states blood sugar batteries stopped working and he has been unable to check numbers for about 3 weeks.  Patient had CT abd/pelvis from 10/29/19 that did not show infection but did show significant diverticulosis. He has a history of peri-rectal abscess/fistula previously managed by [facility].     Review of systems: A 12 point comprehensive review of systems was obtained and is negative unless otherwise stated in the history of present illness.  Patient Active Problem List   Diagnosis Date Noted  . Poorly controlled diabetes mellitus ([place]) 11/09/2019  . Dyspnea on exertion 02/19/2017  . Bilateral lower extremity edema 07/06/2015  . CHF (congestive heart failure) ([place]) 06/27/2015  . Fungal dermatitis 06/27/2015  . Edema 06/20/2015  . Increasing shortness of breath 06/20/2015  . Lumbar radiculopathy, acute 03/09/2015  . Essential hypertension 12/27/2014  . Type 2 diabetes mellitus with diabetic neuropathy, unspecified ([place]) 12/27/2014  . Mild aortic stenosis 06/28/2014  . Memory loss 04/29/2014  . Tinnitus of both ears 02/17/2014  . Wellness examination 12/04/2013  . Constipation due to slow transit 08/18/2013  . S/P left TKA 06/22/2013  . Meniere disease 04/21/2013  . Benign paroxysmal positional vertigo 04/21/2013  . Psoriatic arthritis ([place]) 05/14/2012  . General medical exam 01/28/2012  . Cellulitis of eyelid 09/25/2011  . Diverticulitis 06/20/2011  . Erythrocytosis 05/18/2011  . S/P TKR (total knee replacement) 12/31/2010  . Obesity, Class III, BMI 40-49.9 (morbid obesity) ([place]) 12/31/2010  . Hyperlipidemia 12/22/2010  . BPH  (benign prostatic hyperplasia) 12/22/2010  . Gout 12/22/2010  . Obstructive sleep apnea 04/04/2009    No current facility-administered medications on file prior to encounter.   Current Outpatient Medications on File Prior to Encounter  Medication Sig Dispense Refill  . Albuterol Sulfate (PROAIR RESPICLICK) 403 (90 Base) MCG/ACT AEPB Inhale 2 puffs into the lungs every 6 (six) hours as needed (for wheezing or shortness of breath). 1 each 3  . allopurinol (ZYLOPRIM) 300 MG tablet TAKE 1 TABLET(300 MG) BY MOUTH DAILY (Patient taking differently: Take 300 mg by mouth daily. ) 30 tablet 0  . aspirin 81 MG tablet Take 81 mg by mouth daily.    Marland Kitchen atorvastatin (LIPITOR) 40 MG tablet TAKE 1 TABLET(40 MG) BY MOUTH EVERY EVENING (Patient taking differently: Take 40 mg by mouth daily. ) 90 tablet 1  . bisoprolol (ZEBETA) 5 MG tablet TAKE 1 TABLET(5 MG) BY MOUTH DAILY (Patient taking differently: Take 5 mg by mouth daily. ) 30 tablet 3  . cetirizine (ZYRTEC) 10 MG tablet TAKE 1 TABLET(10 MG) BY MOUTH DAILY (Patient taking differently: Take 10 mg by mouth daily. ) 30 tablet 0  . Cholecalciferol (VITAMIN D) 2000 UNITS tablet Take 2,000 Units by mouth daily.    . Cyanocobalamin (B-12) 2000 MCG TABS Take 1 tablet by mouth daily.     Marland Kitchen donepezil (ARICEPT) 10 MG tablet Take 1 tablet (10 mg total) by mouth at bedtime. 30 tablet 3  . empagliflozin (JARDIANCE) 10 MG TABS tablet Take 10 mg by mouth daily before breakfast. 30 tablet 6  . etodolac (LODINE) 400 MG tablet Take by mouth.    Marland Kitchen  fluocinonide cream (LIDEX) 0.05 %     . fluticasone (FLONASE) 50 MCG/ACT nasal spray Place 2 sprays into both nostrils daily. 16 g 6  . folic acid (FOLVITE) 1 MG tablet Take 1 mg by mouth daily.     . furosemide (LASIX) 20 MG tablet TAKE 1 TABLET(20 MG) BY MOUTH DAILY (Patient taking differently: Take 20 mg by mouth daily. ) 90 tablet 1  . meloxicam (MOBIC) 15 MG tablet Take 1 tablet by mouth daily. 90 tablet 1  . MYRBETRIQ 25 MG  TB24 tablet     . nystatin ointment (MYCOSTATIN) Apply 1 application topically 2 (two) times daily. (Patient taking differently: Apply 1 application topically 2 (two) times daily as needed. ) 90 g 1  . omeprazole (PRILOSEC) 40 MG capsule TAKE 1 CAPSULE BY MOUTH 30 TO 60 MINUTES BEFORE YOUR FIRST AND LAST MEAL OF THE DAY (Patient taking differently: Take 40 mg by mouth daily. 30 TO 60 MINUTES BEFORE YOUR FIRST AND LAST MEAL OF THE DAY) 180 capsule 0  . potassium chloride (MICRO-K) 10 MEQ CR capsule TAKE 1 CAPSULE BY MOUTH EVERY DAY (Patient taking differently: Take 10 mEq by mouth daily. ) 90 capsule 1  . sertraline (ZOLOFT) 50 MG tablet TAKE 1 TABLET(50 MG) BY MOUTH DAILY. (Patient taking differently: Take 50 mg by mouth daily. ) 90 tablet 1  . telmisartan (MICARDIS) 40 MG tablet TAKE 1 TABLET(40 MG) BY MOUTH DAILY (Patient taking differently: Take 40 mg by mouth daily. ) 30 tablet 11  . tiZANidine (ZANAFLEX) 4 MG tablet Take 1 tablet (4 mg total) by mouth every 6 (six) hours as needed for muscle spasms. 30 tablet 0  . triamterene-hydrochlorothiazide (MAXZIDE) 75-50 MG tablet Take 0.5 tablets by mouth every morning. Please call the office to schedule an appointment 45 tablet 1  . UNABLE TO FIND Med Name: BiPap with [employer]      Past Medical History:  Diagnosis Date  . Arthritis   . Constipation   . Diabetes mellitus   . Heart murmur   . History of gout   . History of skin cancer   . Hypertension   . Morbid (severe) obesity due to excess calories ([place])   . OSA (obstructive sleep apnea)   . Psoriatic arthritis ([place])   . Rectal fissure   . Sleep apnea   . Tinnitus     Past Surgical History:  Procedure Laterality Date  . addenoids    . JOINT REPLACEMENT  2012   RT KNEE  . LUNG BIOPSY     20 YRS AGO  . TONSILLECTOMY    . TOTAL KNEE ARTHROPLASTY Bilateral 06/22/2013   Procedure: LEFT TOTAL KNEE ARTHROPLASTY;  MEDIAL SOFT TISSUE EXPLORATION SAPHENOUS NEURECTOMY RIGHT KNEE;  Surgeon:  Mauri Pole, MD;  Location: [facility];  Service: Orthopedics;  Laterality: Bilateral;    Social History   Tobacco Use  . Smoking status: Never Smoker  . Smokeless tobacco: Never Used  Vaping Use  . Vaping Use: Never used  Substance Use Topics  . Alcohol use: Yes    Alcohol/week: 0.0 standard drinks    Comment: Once a month  . Drug use: No    Family History  Problem Relation Age of Onset  . Emphysema Father   . Clotting disorder Father   . Arthritis Father   . Heart disease Mother   . Arthritis Mother   . Arthritis Maternal Grandmother   . Hypertension Maternal Grandmother   . Diabetes Maternal Grandmother   .  Arthritis Paternal Grandmother   . Diabetes Maternal Aunt   . Diabetes Paternal Aunt     PE: Vitals:   11/09/19 2104 11/09/19 2230 11/09/19 2248 11/09/19 2300  BP: 123/82 111/68 (!) 104/58 (!) 117/101  Pulse: 94 88 86 87  Resp: (!) 22 20 (!) 22 (!) 22  Temp:      SpO2: 99% 98% 98% 100%   Patient appears to be in no acute distress  patient is alert and oriented x3 Atraumatic normocephalic head No cervical or supraclavicular lymphadenopathy appreciated No increased work of breathing, no audible wheezes/rhonchi Regular sinus rhythm/rate Abdomen is soft, obese with pannus, nontender, nondistended GU: foley draining clear yellow urine, circ phallus, erythematous skin folds consistent with candida, testes descended b/l without mass/fluctuance; mild perineal tenderness; DRE with moderate tenderness but not induration Lower extremities are symmetric  Grossly neurologically intact No identifiable skin lesions  Recent Labs    11/09/19 1833  WBC 16.7*  HGB 16.3  HCT 46.7   Recent Labs    11/09/19 1833  NA 132*  K 3.3*  CL 94*  CO2 21*  GLUCOSE 259*  BUN 19  CREATININE 1.50*  CALCIUM 9.3   No results for input(s): LABPT, INR in the last 72 hours. No results for input(s): LABURIN in the last 72 hours. Results for orders placed or performed during the  hospital encounter of 11/09/19  Respiratory Panel by RT PCR (Flu A&B, Covid) - Nasopharyngeal Swab     Status: None   Collection Time: 11/09/19  7:37 PM   Specimen: Nasopharyngeal Swab  Result Value Ref Range Status   SARS Coronavirus 2 by RT PCR NEGATIVE NEGATIVE Final    Comment: (NOTE) SARS-CoV-2 target nucleic acids are NOT DETECTED.  The SARS-CoV-2 RNA is generally detectable in upper respiratoy specimens during the acute phase of infection. The lowest concentration of SARS-CoV-2 viral copies this assay can detect is 131 copies/mL. A negative result does not preclude SARS-Cov-2 infection and should not be used as the sole basis for treatment or other patient management decisions. A negative result may occur with  improper specimen collection/handling, submission of specimen other than nasopharyngeal swab, presence of viral mutation(s) within the areas targeted by this assay, and inadequate number of viral copies (<131 copies/mL). A negative result must be combined with clinical observations, patient history, and epidemiological information. The expected result is Negative.  Fact Sheet for Patients:  PinkCheek.be  Fact Sheet for Healthcare Providers:  GravelBags.it  This test is no t yet approved or cleared by the [place] FDA and  has been authorized for detection and/or diagnosis of SARS-CoV-2 by FDA under an Emergency Use Authorization (EUA). This EUA will remain  in effect (meaning this test can be used) for the duration of the COVID-19 declaration under Section 564(b)(1) of the Act, 21 U.S.C. section 360bbb-3(b)(1), unless the authorization is terminated or revoked sooner.     Influenza A by PCR NEGATIVE NEGATIVE Final   Influenza B by PCR NEGATIVE NEGATIVE Final    Comment: (NOTE) The Xpert Xpress SARS-CoV-2/FLU/RSV assay is intended as an aid in  the diagnosis of influenza from Nasopharyngeal swab  specimens and  should not be used as a sole basis for treatment. Nasal washings and  aspirates are unacceptable for Xpert Xpress SARS-CoV-2/FLU/RSV  testing.  Fact Sheet for Patients: PinkCheek.be  Fact Sheet for Healthcare Providers: GravelBags.it  This test is not yet approved or cleared by the [place] FDA and  has been authorized for detection  and/or diagnosis of SARS-CoV-2 by  FDA under an Emergency Use Authorization (EUA). This EUA will remain  in effect (meaning this test can be used) for the duration of the  Covid-19 declaration under Section 564(b)(1) of the Act, 21  U.S.C. section 360bbb-3(b)(1), unless the authorization is  terminated or revoked. Performed at [facility], [address] [place], [place] [address]     Imaging: CT Abd/Pelvis IMPRESSION: 1. No bowel obstruction. 2. Midline perineal abscess measuring 3.4 x 2.5 cm just below the base of the penis anterior to the rectum, with patchy soft tissue air tracking in the left perineum, into the subcutaneous tissues. Findings highly suspicious for Fournier's gangrene. 3. Distended urinary bladder without bladder wall thickening. 4. Hepatic steatosis. Cholelithiasis without gallbladder inflammation. 5. Colonic diverticulosis without diverticulitis.  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  GU physical exam without palpable induration/fluctuance.  1. Perineal abscess: -patient evaluated by both urology and general surgery; agree on OR for debridement given patient body habitus and depth of abscess  -Dr. Johney Maine has offered to take patient to OR tonight given history of perirectal fistula; will call Urology if concern for any complex urologic involvement  2. Urinary retention: patient bladder extremely distended on CT.  Foley catheter placed. Recommend starting flomax.       Thank you for  involving me in this patient's care.  Please page with any further questions or concerns. Zyion Leidner D Gayna Braddy

## 2019-11-10 NOTE — Anesthesia Procedure Notes (Addendum)
Procedure Name: Intubation Date/Time: 11/10/2019 2:28 AM Performed by: Lissa Morales, CRNA Pre-anesthesia Checklist: Patient identified, Emergency Drugs available, Suction available and Patient being monitored Patient Re-evaluated:Patient Re-evaluated prior to induction Oxygen Delivery Method: Circle system utilized Preoxygenation: Pre-oxygenation with 100% oxygen Induction Type: IV induction, Rapid sequence and Cricoid Pressure applied Ventilation: Mask ventilation without difficulty Laryngoscope Size: Glidescope and 4 Tube type: Oral Number of attempts: 2 Airway Equipment and Method: Stylet and Oral airway Placement Confirmation: ETT inserted through vocal cords under direct vision,  positive ETCO2 and breath sounds checked- equal and bilateral Secured at: 23 cm Tube secured with: Tape Dental Injury: Teeth and Oropharynx as per pre-operative assessment  Difficulty Due To: Difficulty was anticipated Future Recommendations: Recommend- induction with short-acting agent, and alternative techniques readily available Comments: One look with MAC 4 and proceeded with glidescope. Chipped front right tooth and incisor. Good visualization with glidescope

## 2019-11-10 NOTE — Progress Notes (Signed)
Pharmacy Antibiotic Note  Gary Butler is a 74 y.o. male presented on 11/09/2019 with perineal abscess s/p I&D in OR on 9/28.  PMH is significant for prior left-sided perianal fistula status post fistulotomy performed at [facility] in 2019.  Pharmacy has been consulted for Cefepime and vancomycin dosing.   WBC 16.7, Tm 99.7, SCr 1.5, LA 3>2  Plan: - Flagyl per MD - Cefepime 2 gm IV Q 8 hours - Vancomycin 2 gm IV load followed by vancomycin 1 gm IV Q 12 hours. - Goal Vanc trough of 15-20 mcg/mL; VT at [facility] if needed. - Monitor CBC, renal fx, cultures and clinical progress     Temp (24hrs), Avg:98.9 F (37.2 C), Min:98.3 F (36.8 C), Max:99.7 F (37.6 C)  Recent Labs  Lab 11/09/19 1833 11/09/19 2239  WBC 16.7*  --   CREATININE 1.50*  --   LATICACIDVEN 3.0* 2.0*    Estimated Creatinine Clearance: 61.2 mL/min (A) (by C-G formula based on SCr of 1.5 mg/dL (H)).    Allergies  Allergen Reactions  . Lyrica [Pregabalin] Swelling  . Penicillins Swelling  . Levofloxacin Rash    Antimicrobials this admission: 9/27 Clindamycin x1 (periop) 9/28 Cefepime >> 9/28 Metronidazole >> 9/28 Vancomycin >>  Dose adjustments this admission:   Microbiology results: 9/14 CDiff: negative 9/27 UCx: 9/27 BCx: ngtd 9/28 Abscess:  Few GPC  9/28 MRSA PCR: negative  Thank you for allowing pharmacy to be a part of this patient's care.  Gretta Arab PharmD, BCPS Clinical Pharmacist [facility] main pharmacy [phone] 11/10/2019 8:13 AM

## 2019-11-10 NOTE — CV Procedure (Signed)
Echocardiogram not completed. Mr. Poyser lunch arrived at the same time, he wanted to eat a warm meal.  Darlina Sicilian RDCS

## 2019-11-10 NOTE — Anesthesia Preprocedure Evaluation (Addendum)
Anesthesia Evaluation  Patient identified by MRN, date of birth, ID band Patient awake    Reviewed: Allergy & Precautions, H&P , NPO status , Patient's Chart, lab work & pertinent test results, reviewed documented beta blocker date and time   Airway Mallampati: III  TM Distance: >3 FB Neck ROM: Full    Dental  (+) Dental Advisory Given, Teeth Intact, Chipped,    Pulmonary sleep apnea ,    breath sounds clear to auscultation       Cardiovascular hypertension, Pt. on medications and Pt. on home beta blockers +CHF  + Valvular Problems/Murmurs AS  Rhythm:Regular Rate:Normal  Echo 09/2017 - Procedure narrative: Transthoracic echocardiography. Image quality was suboptimal. The study was technically difficult.Intravenous contrast (Definity) was administered to opacify theLV.  - Left ventricle: The cavity size was normal. There was moderateconcentric hypertrophy. Systolic function was normal. Theestimated ejection fraction was in the range of 60% to 65%. Wall motion was normal; there were no regional wall motionabnormalities. Features are consistent with a pseudonormal left ventricular filling pattern, with concomitant abnormal relaxation and increased filling pressure (grade 2 diastolic dysfunction).Doppler parameters are consistent with high ventricular fillingpressure.  - Aortic valve: There was mild stenosis.  - Aorta: Ascending aortic diameter: 40 mm (S).  - Ascending aorta: The ascending aorta was mildly dilated.  - Pulmonary arteries: Systolic pressure could not be accurately estimated.   Impressions:  - Overall normal LVF with normal wall motion. Chamber measurmentsinaccurate due to off access images but there appears to be at least mild to moderate LVH. Mildly dilated ascending aorta. MildAS. Grade 2 DD.    Stress 11/2017 The left ventricular ejection fraction is normal (55-65%). Nuclear stress EF: 59%. There  was no ST segment deviation noted during stress. The study is normal. This is a low risk study.    Neuro/Psych negative neurological ROS  negative psych ROS   GI/Hepatic negative GI ROS, Neg liver ROS,   Endo/Other  diabetes, Type 2, Oral Hypoglycemic AgentsMorbid obesity  Renal/GU negative Renal ROS     Musculoskeletal negative musculoskeletal ROS (+)   Abdominal (+) + obese,   Peds  Hematology negative hematology ROS (+)   Anesthesia Other Findings   Reproductive/Obstetrics                           Anesthesia Physical  Anesthesia Plan  ASA: III and emergent  Anesthesia Plan: General   Post-op Pain Management:    Induction: Intravenous  PONV Risk Score and Plan: 3 and Ondansetron, Dexamethasone and Treatment may vary due to age or medical condition  Airway Management Planned: Oral ETT  Additional Equipment: None  Intra-op Plan:   Post-operative Plan: Possible Post-op intubation/ventilation  Informed Consent: I have reviewed the patients History and Physical, chart, labs and discussed the procedure including the risks, benefits and alternatives for the proposed anesthesia with the patient or authorized representative who has indicated his/her understanding and acceptance.     Dental advisory given  Plan Discussed with: CRNA  Anesthesia Plan Comments:        Anesthesia Quick Evaluation

## 2019-11-10 NOTE — Plan of Care (Signed)
POC initiated 

## 2019-11-10 NOTE — H&P (Signed)
History and Physical    Martyn Timme ZYY:[id number] DOB: 02/01/1946 DOA: 11/09/2019  PCP: [name], MD  Patient coming from: Home - Transfer from [facility]   Chief Complaint:  Chief Complaint  Patient presents with  . Emesis  . Weakness     HPI: 74 year old male with past medical history of hyperlipidemia, hypertension, diabetes mellitus type 2, early dementia/mild cognitive impairment, gastroesophageal reflux disease and pertinent past medical history of a left-sided perianal fistula status post fistulotomy performed at [facility] in 2019. Patient presented to [place] emergency department with complaints of frequent bouts of watery diarrhea and rectal pain.  Patient explains that for at least the past 6 weeks he has been experiencing what he describes as explosive bouts of watery diarrhea. Is been occurring numerous times daily over this entire time. In the past several days to weeks, patient has additionally been experiencing progressively worsening rectal discomfort. Patient described this as sore in quality, moderate to severe in intensity and waxing and waning with bowel movements. Patient attributed this to frequent bouts of diarrhea.  Finally, patient also complains of associated frequent bouts of vomiting and inability to tolerate oral intake for the past several days. Of note, patient underwent recent EGD approximately 1 week ago with esophageal dilation.   Patient eventually came to [place] emergency department at the direction of his primary care provider.  Upon evaluation at [facility], patient underwent CT imaging of the abdomen and pelvis that revealed evidence of abscess formation of 3.4 x 2.5 cm just below the base of the penis. There is initially concern for Fournier's gangrene and therefore after discussion with urology and general surgery patient was transferred to the [facility] long ED for evaluation. Upon evaluation by Dr. Johney Maine and  Dr. Claudia Desanctis it was felt the patient was suffering from a perineal abscess without Fournier's. Patient was taken to the operating room at [facility] long by Dr. Johney Maine for an incision and drainage. The hospitalist group was then called to assess the patient for admission to the hospital afterwards for medical management of his infection and other comorbidities.  Review of Systems:   Review of Systems  Constitutional: Positive for weight loss.  Gastrointestinal: Positive for nausea and vomiting.  Neurological: Positive for weakness.    Past Medical History:  Diagnosis Date  . Arthritis   . Constipation   . Diabetes mellitus   . Heart murmur   . History of gout   . History of skin cancer   . Hypertension   . Morbid (severe) obesity due to excess calories ([place])   . OSA (obstructive sleep apnea)   . Psoriatic arthritis ([place])   . Rectal fissure   . Sleep apnea   . Tinnitus     Past Surgical History:  Procedure Laterality Date  . addenoids    . ANAL FISTULOTOMY  2019   Dr Drue Flirt, [place]  . JOINT REPLACEMENT  2012   RT KNEE  . LUNG BIOPSY     20 YRS AGO  . TONSILLECTOMY    . TOTAL KNEE ARTHROPLASTY Bilateral 06/22/2013   Procedure: LEFT TOTAL KNEE ARTHROPLASTY;  MEDIAL SOFT TISSUE EXPLORATION SAPHENOUS NEURECTOMY RIGHT KNEE;  Surgeon: Mauri Pole, MD;  Location: [facility] ORS;  Service: Orthopedics;  Laterality: Bilateral;     reports that he has never smoked. He has never used smokeless tobacco. He reports current alcohol use. He reports that he does not use drugs.  Allergies  Allergen  Reactions  . Lyrica [Pregabalin] Swelling  . Penicillins Swelling  . Levofloxacin Rash    Family History  Problem Relation Age of Onset  . Emphysema Father   . Clotting disorder Father   . Arthritis Father   . Heart disease Mother   . Arthritis Mother   . Arthritis Maternal Grandmother   . Hypertension Maternal Grandmother   . Diabetes Maternal Grandmother   . Arthritis Paternal Grandmother   .  Diabetes Maternal Aunt   . Diabetes Paternal Aunt      Prior to Admission medications   Medication Sig Start Date End Date Taking? Authorizing Provider  allopurinol (ZYLOPRIM) 300 MG tablet TAKE 1 TABLET(300 MG) BY MOUTH DAILY Patient taking differently: Take 300 mg by mouth daily.  01/13/18  Yes [name], MD  atorvastatin (LIPITOR) 40 MG tablet TAKE 1 TABLET(40 MG) BY MOUTH EVERY EVENING Patient taking differently: Take 40 mg by mouth daily.  07/06/19  Yes [name], MD  bisoprolol (ZEBETA) 5 MG tablet TAKE 1 TABLET(5 MG) BY MOUTH DAILY Patient taking differently: Take 5 mg by mouth daily.  11/07/19  Yes Tanda Rockers, MD  cetirizine (ZYRTEC) 10 MG tablet TAKE 1 TABLET(10 MG) BY MOUTH DAILY Patient taking differently: Take 10 mg by mouth daily.  02/09/19  Yes [name], MD  Cholecalciferol (VITAMIN D) 2000 UNITS tablet Take 2,000 Units by mouth daily.   Yes [provider]  Cyanocobalamin (B-12) 2000 MCG TABS Take 1 tablet by mouth daily.    Yes [provider]  donepezil (ARICEPT) 10 MG tablet Take 1 tablet (10 mg total) by mouth at bedtime. 08/10/19  Yes [name], MD  empagliflozin (JARDIANCE) 10 MG TABS tablet Take 10 mg by mouth daily before breakfast. 04/22/19  Yes [name], MD  fluticasone [facility]) 50 MCG/ACT nasal spray Place 2 sprays into both nostrils daily. Patient taking differently: Place 2 sprays into both nostrils daily as needed for allergies or rhinitis.  01/17/17  Yes [name], MD  folic acid (FOLVITE) 1 MG tablet Take 1 mg by mouth daily.  04/29/12  Yes [provider]  furosemide (LASIX) 20 MG tablet TAKE 1 TABLET(20 MG) BY MOUTH DAILY Patient taking differently: Take 20 mg by mouth daily.  07/06/19  Yes [name], MD  meloxicam (MOBIC) 15 MG tablet Take 1 tablet by mouth daily. 07/06/19  Yes [name], MD  nystatin ointment (MYCOSTATIN) Apply 1 application topically 2  (two) times daily. Patient taking differently: Apply 1 application topically 2 (two) times daily as needed.  06/27/15  Yes [name], MD  omeprazole (PRILOSEC) 40 MG capsule TAKE 1 CAPSULE BY MOUTH 30 TO 60 MINUTES BEFORE YOUR FIRST AND LAST MEAL OF THE DAY Patient taking differently: Take 40 mg by mouth daily. 30 TO 60 MINUTES BEFORE YOUR FIRST AND LAST MEAL OF THE DAY 09/22/19  Yes Tanda Rockers, MD  potassium chloride (MICRO-K) 10 MEQ CR capsule TAKE 1 CAPSULE BY MOUTH EVERY DAY Patient taking differently: Take 10 mEq by mouth daily.  07/06/19  Yes [name], MD  sertraline (ZOLOFT) 50 MG tablet TAKE 1 TABLET(50 MG) BY MOUTH DAILY. Patient taking differently: Take 50 mg by mouth daily.  04/17/19  Yes [name], MD  telmisartan (MICARDIS) 40 MG tablet TAKE 1 TABLET(40 MG) BY MOUTH DAILY Patient taking differently: Take 40 mg by mouth daily.  03/19/19  Yes Tanda Rockers, MD  tiZANidine (  ZANAFLEX) 4 MG tablet Take 1 tablet (4 mg total) by mouth every 6 (six) hours as needed for muscle spasms. 07/26/16  Yes [name], MD  triamterene-hydrochlorothiazide (MAXZIDE) 75-50 MG tablet Take 0.5 tablets by mouth every morning. Please call the office to schedule an appointment 07/06/19  Yes [name], MD  Albuterol Sulfate (PROAIR RESPICLICK) 242 (90 Base) MCG/ACT AEPB Inhale 2 puffs into the lungs every 6 (six) hours as needed (for wheezing or shortness of breath). 04/07/18   Brunetta Jeans, PA-C  aspirin 81 MG tablet Take 81 mg by mouth daily.    [provider]  etodolac (LODINE) 400 MG tablet Take by mouth.    [provider]  fluocinonide cream (LIDEX) 0.05 %  10/06/19   [provider]  MYRBETRIQ 25 MG TB24 tablet  02/11/15   [provider]  [place] Name: BiPap with [employer]    [provider]    Physical Exam: Vitals:   11/10/19 0545 11/10/19 0600 11/10/19 0615 11/10/19 0630  BP: 107/64 111/63  (!) 106/59 104/63  Pulse: 76 78 81 81  Resp: 16 16 20 17   Temp:      SpO2: 95% 95% 93% 93%    Constitutional: Lethargic but arousable and oriented x3. Patient is experiencing mild postsurgical pain. Skin: Dressing of the perineum is currently clean dry and intact. Notable poor skin turgor. Eyes: Pupils are equally reactive to light.  No evidence of scleral icterus or conjunctival pallor.  ENMT: Dry mucous membranes noted.  Posterior pharynx clear of any exudate or lesions.   Neck: normal, supple, no masses, no thyromegaly.  No evidence of jugular venous distension.   Respiratory: clear to auscultation bilaterally, no wheezing, no crackles. Normal respiratory effort. No accessory muscle use.  Cardiovascular: Regular rate and rhythm, no murmurs / rubs / gallops. No extremity edema. 2+ pedal pulses. No carotid bruits.  Chest:   Nontender without crepitus or deformity.   Back:   Nontender without crepitus or deformity. Abdomen: Abdomen is soft and nontender.  No evidence of intra-abdominal masses.  Positive bowel sounds noted in all quadrants.   Musculoskeletal: No joint deformity upper and lower extremities. Good ROM, no contractures. Normal muscle tone.  Neurologic: CN 2-12 grossly intact. Sensation intact.  Patient moving all 4 extremities spontaneously.  Patient is following all commands.  Patient is responsive to verbal stimuli.   Psychiatric: Patient exhibits normal mood with appropriate affect.  Patient seems to possess insight as to their current situation.   GU: Dressing of the perineum is clean dry and intact. Notable surrounding tenderness erythema and induration. Foley catheter is in place draining clear yellow urine.   Labs on Admission: I have personally reviewed following labs and imaging studies -   CBC: Recent Labs  Lab 11/09/19 1833  WBC 16.7*  NEUTROABS 15.3*  HGB 16.3  HCT 46.7  MCV 90.9  PLT 683   Basic Metabolic Panel: Recent Labs  Lab 11/09/19 1833  NA 132*   K 3.3*  CL 94*  CO2 21*  GLUCOSE 259*  BUN 19  CREATININE 1.50*  CALCIUM 9.3   GFR: Estimated Creatinine Clearance: 61.2 mL/min (A) (by C-G formula based on SCr of 1.5 mg/dL (H)). Liver Function Tests: Recent Labs  Lab 11/09/19 1833  AST 25  ALT 15  ALKPHOS 92  BILITOT 1.4*  PROT 6.8  ALBUMIN 3.3*   Recent Labs  Lab 11/09/19 1833  LIPASE 22   No results  for input(s): AMMONIA in the last 168 hours. Coagulation Profile: No results for input(s): INR, PROTIME in the last 168 hours. Cardiac Enzymes: No results for input(s): CKTOTAL, CKMB, CKMBINDEX, TROPONINI in the last 168 hours. BNP (last 3 results) No results for input(s): PROBNP in the last 8760 hours. HbA1C: No results for input(s): HGBA1C in the last 72 hours. CBG: Recent Labs  Lab 11/09/19 1831 11/10/19 0006 11/10/19 0319 11/10/19 0727  GLUCAP 223* 255* 196* 190*   Lipid Profile: No results for input(s): CHOL, HDL, LDLCALC, TRIG, CHOLHDL, LDLDIRECT in the last 72 hours. Thyroid Function Tests: No results for input(s): TSH, T4TOTAL, FREET4, T3FREE, THYROIDAB in the last 72 hours. Anemia Panel: No results for input(s): VITAMINB12, FOLATE, FERRITIN, TIBC, IRON, RETICCTPCT in the last 72 hours. Urine analysis:    Component Value Date/Time   COLORURINE YELLOW 11/09/2019 1937   APPEARANCEUR CLEAR 11/09/2019 1937   LABSPEC <1.005 (L) 11/09/2019 1937   PHURINE 5.5 11/09/2019 1937   GLUCOSEU >=500 (A) 11/09/2019 1937   HGBUR NEGATIVE 11/09/2019 1937   BILIRUBINUR NEGATIVE 11/09/2019 1937   KETONESUR NEGATIVE 11/09/2019 1937   PROTEINUR NEGATIVE 11/09/2019 1937   UROBILINOGEN 1.0 06/10/2013 1000   NITRITE NEGATIVE 11/09/2019 1937   LEUKOCYTESUR NEGATIVE 11/09/2019 1937    Radiological Exams on Admission - Personally Reviewed: CT Abdomen Pelvis W Contrast  Result Date: 11/09/2019 CLINICAL DATA:  Bowel obstruction suspected vomiting since esophageal dilation, unable to tolerate PO Increasing weakness.  EXAM: CT ABDOMEN AND PELVIS WITH CONTRAST TECHNIQUE: Multidetector CT imaging of the abdomen and pelvis was performed using the standard protocol following bolus administration of intravenous contrast. CONTRAST:  73mL OMNIPAQUE IOHEXOL 300 MG/ML  SOLN COMPARISON:  CT 11 days ago 10/29/2019 FINDINGS: Lower chest: Again seen subpleural fat at the lung bases. No pleural fluid. No lower pneumomediastinum or esophageal wall thickening. No focal consolidation. Hepatobiliary: Decreased hepatic density consistent with steatosis. Previous geographic fatty distribution is more homogeneous on the current exam, likely related to differences in contrast timing. There is no discrete focal hepatic lesion. Layering gallstones without pericholecystic inflammation. There is no biliary dilatation. No visualized choledocholithiasis. Pancreas: Fatty atrophy.  No ductal dilatation or inflammation. Spleen: Normal in size without focal abnormality. Adrenals/Urinary Tract: Normal right adrenal gland. Punctate left adrenal calcification unchanged. No hydronephrosis or perinephric edema. Again seen thinning of bilateral renal parenchyma. There are bilateral renal cysts are unchanged since recent exam. The urinary bladder is distended. No bladder wall thickening. Bladder(volume = 1580 cm^3). Stomach/Bowel: No distal esophageal wall thickening. Stomach is decompressed. Normal positioning of the duodenum and ligament of Treitz. Decompressed small bowel without obstruction or inflammation. Normal appendix. Small volume of stool throughout the colon. Sigmoid colon is tortuous. Distal colonic diverticulosis without focal diverticulitis. There is no colonic wall thickening or inflammation. Vascular/Lymphatic: Aorto bi-iliac atherosclerosis without aortic aneurysm. The portal vein is patent. No acute vascular finding. No abdominopelvic adenopathy. Reproductive: Normal sized prostate gland, see details below regarding adjacent soft tissue air. Other:  There is edema and soft tissue gas involving the left perineum extending to the base of the penis. Small perineal fluid collection measures 3.4 x 2.5 cm, series 2, image 87. Tracking soft tissue air extends superiorly along the inferior margin of the prostate gland. No abdominopelvic ascites. Musculoskeletal: Multilevel degenerative change throughout the spine. Degenerative change of the hips. No bony destruction or acute osseous abnormality. IMPRESSION: 1. No bowel obstruction. 2. Midline perineal abscess measuring 3.4 x 2.5 cm just below the base of the penis  anterior to the rectum, with patchy soft tissue air tracking in the left perineum, into the subcutaneous tissues. Findings highly suspicious for Fournier's gangrene. 3. Distended urinary bladder without bladder wall thickening. 4. Hepatic steatosis. Cholelithiasis without gallbladder inflammation. 5. Colonic diverticulosis without diverticulitis. Aortic Atherosclerosis (ICD10-I70.0). These results were called by telephone at the time of interpretation on 11/09/2019 at 9:17 pm to Dr Apolonio Schneiders LITTLE , who verbally acknowledged these results. Electronically Signed   By: Keith Rake M.D.   On: 11/09/2019 21:18    EKG: Personally reviewed.  Rhythm is atrial fibrillation with heart rate of 108 bpm.  No dynamic ST segment changes appreciated.  Assessment/Plan Principal Problem:   Suprasphincteric perirectal abscess s/p I&D 11/10/2019   Patient is status post successful incision and drainage by Dr. Johney Maine this morning  Surgery to follow and assist with education on dressing changes  Continuing broad-spectrum intravenous antibiotic therapy until cultures result -we will continue to provide intravenous cefepime, Flagyl and vancomycin for coverage of both MRSA and anaerobic organisms  Concerning the etiology of the abscess, this seems to have followed a several week history of an tense frequent diarrhea. Patient has a history of a perianal fistula  requiring fistulotomy in 2019 at [facility]. The appearance of the abscess is concerning for the development of another fistula.  Blood cultures obtained  Continuing intravenous fluids  . No previous analgesics for associated pain  Active Problems:   Acute urinary retention s/p Foley 11/10/2019   Patient exhibited acute urinary retention postop in the PACU, Foley catheter placed  As patient clinically improves will attempt to remove Foley catheter for voiding trial  Atrial fibrillation   Patient noted to be in atrial fibrillation on this presentation, new onset without previous history  Heart rate seems to be coming down however postoperatively without AV nodal blocking agents  We will obtain repeat EKG to determine whether patient is converted to normal sinus rhythm  We will admit to telemetry bed for continued monitoring  Etiology of atrial fibrillation is likely infection however we will obtain echocardiogram, troponin, TSH     Diarrhea   Patient reports 6 to 8-week history of diarrhea, etiology unclear  Who reports that patient has already seen gastroenterology recently but this seems to be for nausea and vomiting not for the diarrhea.  Patient did however have stool studies performed by his primary care provider a week or so ago that were negative for C. Difficile  No enteric precautions required  Review of medications reveals the patient is on atorvastatin which can frequently cause diarrhea, this will be discontinued  We will monitor for recurrence of diarrhea postoperatively, if this continues patient may require a Flexi-Seal to avoid soiling the perineal wound.    Essential hypertension   Continue home regimen of antihypertensives    Uncontrolled type 2 diabetes mellitus with hyperglycemia,   Holding home hypoglycemic  Accu-Cheks before every meal and nightly with sliding scale insulin  Recent hemoglobin A1c was found to be elevated at 9.1         Code Status:  Full code Family Communication: deferred   Status is: Inpatient  Remains inpatient appropriate because:IV treatments appropriate due to intensity of illness or inability to take PO and Inpatient level of care appropriate due to severity of illness   Dispo: The patient is from: Home              Anticipated d/c is to: Home  Anticipated d/c date is: > 3 days              Patient currently is not medically stable to d/c.        Vernelle Emerald MD [facility] Pager [phone]  If 7PM-7AM, please contact night-coverage [email or web address] Use universal [place] password for that web site. If you do not have the password, please call the hospital operator.  11/10/2019, 7:35 AM

## 2019-11-10 NOTE — Progress Notes (Signed)
Day of Surgery  Subjective: Just a few hours post op from drainage of abscess.  Feels better already  ROS: See above, otherwise other systems negative  Objective: Vital signs in last 24 hours: Temp:  [98.1 F (36.7 C)-99.7 F (37.6 C)] 98.1 F (36.7 C) (09/28 0933) Pulse Rate:  [65-129] 65 (09/28 0933) Resp:  [9-36] 17 (09/28 0933) BP: (97-132)/(51-112) 97/51 (09/28 0933) SpO2:  [76 %-100 %] 91 % (09/28 0933)    Intake/Output from previous day: 09/27 0701 - 09/28 0700 In: 1600 [I.V.:1500; IV Piggyback:100] Out: 3405 [Urine:3400; Blood:5] Intake/Output this shift: Total I/O In: 500 [I.V.:500] Out: 300 [Urine:300]  PE: Skin: packing in place.  Some drainage on dressing as expected  Lab Results:  Recent Labs    11/09/19 1833  WBC 16.7*  HGB 16.3  HCT 46.7  PLT 277   BMET Recent Labs    11/09/19 1833  NA 132*  K 3.3*  CL 94*  CO2 21*  GLUCOSE 259*  BUN 19  CREATININE 1.50*  CALCIUM 9.3   PT/INR No results for input(s): LABPROT, INR in the last 72 hours. CMP     Component Value Date/Time   NA 132 (L) 11/09/2019 1833   NA 139 06/17/2013 0946   K 3.3 (L) 11/09/2019 1833   K 3.5 06/17/2013 0946   CL 94 (L) 11/09/2019 1833   CL 102 05/14/2012 0856   CO2 21 (L) 11/09/2019 1833   CO2 27 06/17/2013 0946   GLUCOSE 259 (H) 11/09/2019 1833   GLUCOSE 184 (H) 06/17/2013 0946   GLUCOSE 135 (H) 05/14/2012 0856   BUN 19 11/09/2019 1833   BUN 13.8 06/17/2013 0946   CREATININE 1.50 (H) 11/09/2019 1833   CREATININE 0.95 07/14/2015 1019   CREATININE 1.1 06/17/2013 0946   CALCIUM 9.3 11/09/2019 1833   CALCIUM 9.3 06/17/2013 0946   PROT 6.8 11/09/2019 1833   PROT 5.7 (L) 06/17/2013 0946   ALBUMIN 3.3 (L) 11/09/2019 1833   ALBUMIN 3.2 (L) 06/17/2013 0946   AST 25 11/09/2019 1833   AST 19 06/17/2013 0946   ALT 15 11/09/2019 1833   ALT 18 06/17/2013 0946   ALKPHOS 92 11/09/2019 1833   ALKPHOS 101 06/17/2013 0946   BILITOT 1.4 (H) 11/09/2019 1833    BILITOT 1.54 (H) 06/17/2013 0946   GFRNONAA 45 (L) 11/09/2019 1833   GFRAA 52 (L) 11/09/2019 1833   Lipase     Component Value Date/Time   LIPASE 22 11/09/2019 1833       Studies/Results: CT Abdomen Pelvis W Contrast  Result Date: 11/09/2019 CLINICAL DATA:  Bowel obstruction suspected vomiting since esophageal dilation, unable to tolerate PO Increasing weakness. EXAM: CT ABDOMEN AND PELVIS WITH CONTRAST TECHNIQUE: Multidetector CT imaging of the abdomen and pelvis was performed using the standard protocol following bolus administration of intravenous contrast. CONTRAST:  6mL OMNIPAQUE IOHEXOL 300 MG/ML  SOLN COMPARISON:  CT 11 days ago 10/29/2019 FINDINGS: Lower chest: Again seen subpleural fat at the lung bases. No pleural fluid. No lower pneumomediastinum or esophageal wall thickening. No focal consolidation. Hepatobiliary: Decreased hepatic density consistent with steatosis. Previous geographic fatty distribution is more homogeneous on the current exam, likely related to differences in contrast timing. There is no discrete focal hepatic lesion. Layering gallstones without pericholecystic inflammation. There is no biliary dilatation. No visualized choledocholithiasis. Pancreas: Fatty atrophy.  No ductal dilatation or inflammation. Spleen: Normal in size without focal abnormality. Adrenals/Urinary Tract: Normal right adrenal gland. Punctate left adrenal calcification unchanged. No  hydronephrosis or perinephric edema. Again seen thinning of bilateral renal parenchyma. There are bilateral renal cysts are unchanged since recent exam. The urinary bladder is distended. No bladder wall thickening. Bladder(volume = 1580 cm^3). Stomach/Bowel: No distal esophageal wall thickening. Stomach is decompressed. Normal positioning of the duodenum and ligament of Treitz. Decompressed small bowel without obstruction or inflammation. Normal appendix. Small volume of stool throughout the colon. Sigmoid colon is  tortuous. Distal colonic diverticulosis without focal diverticulitis. There is no colonic wall thickening or inflammation. Vascular/Lymphatic: Aorto bi-iliac atherosclerosis without aortic aneurysm. The portal vein is patent. No acute vascular finding. No abdominopelvic adenopathy. Reproductive: Normal sized prostate gland, see details below regarding adjacent soft tissue air. Other: There is edema and soft tissue gas involving the left perineum extending to the base of the penis. Small perineal fluid collection measures 3.4 x 2.5 cm, series 2, image 87. Tracking soft tissue air extends superiorly along the inferior margin of the prostate gland. No abdominopelvic ascites. Musculoskeletal: Multilevel degenerative change throughout the spine. Degenerative change of the hips. No bony destruction or acute osseous abnormality. IMPRESSION: 1. No bowel obstruction. 2. Midline perineal abscess measuring 3.4 x 2.5 cm just below the base of the penis anterior to the rectum, with patchy soft tissue air tracking in the left perineum, into the subcutaneous tissues. Findings highly suspicious for Fournier's gangrene. 3. Distended urinary bladder without bladder wall thickening. 4. Hepatic steatosis. Cholelithiasis without gallbladder inflammation. 5. Colonic diverticulosis without diverticulitis. Aortic Atherosclerosis (ICD10-I70.0). These results were called by telephone at the time of interpretation on 11/09/2019 at 9:17 pm to Dr Apolonio Schneiders LITTLE , who verbally acknowledged these results. Electronically Signed   By: Keith Rake M.D.   On: 11/09/2019 21:18    Anti-infectives: Anti-infectives (From admission, onward)   Start     Dose/Rate Route Frequency Ordered Stop   11/10/19 1000  vancomycin (VANCOCIN) IVPB 1000 mg/200 mL premix        1,000 mg 200 mL/hr over 60 Minutes Intravenous Every 12 hours 11/09/19 2134     11/10/19 1000  fluconazole (DIFLUCAN) tablet 200 mg        200 mg Oral Daily 11/10/19 0132 11/13/19  0959   11/10/19 0900  metroNIDAZOLE (FLAGYL) IVPB 500 mg        500 mg 100 mL/hr over 60 Minutes Intravenous Every 8 hours 11/10/19 0731     11/10/19 0600  ceFEPIme (MAXIPIME) 2 g in sodium chloride 0.9 % 100 mL IVPB        2 g 200 mL/hr over 30 Minutes Intravenous Every 8 hours 11/09/19 2134     11/10/19 0600  clindamycin (CLEOCIN) IVPB 900 mg  Status:  Discontinued        900 mg 100 mL/hr over 30 Minutes Intravenous On call to O.R. 11/10/19 0113 11/10/19 0516   11/09/19 2130  ceFEPIme (MAXIPIME) 2 g in sodium chloride 0.9 % 100 mL IVPB        2 g 200 mL/hr over 30 Minutes Intravenous  Once 11/09/19 2128 11/09/19 2217   11/09/19 2130  vancomycin (VANCOCIN) IVPB 1000 mg/200 mL premix        1,000 mg 200 mL/hr over 60 Minutes Intravenous Every hour 11/09/19 2128 11/09/19 2333   11/09/19 2115  clindamycin (CLEOCIN) IVPB 900 mg        900 mg 100 mL/hr over 30 Minutes Intravenous  Once 11/09/19 2114 11/09/19 2147       Assessment/Plan  OSA Morbid obesity HTN DM  --  per medicine --  POD 0, s/p I&D of suprasphincteric perirectal abscess -begin dressing changes later today, will likely only do dressing changes a couple of time and then leave wound open -cont abx therapy -await cultures    FEN - carb mod diet VTE - ok for chemical prophylaxis from our standpoint ID - [email or web address]   LOS: 0 days    Gary Butler , [facility] Surgery 11/10/2019, 9:52 AM Please see Amion for pager number during day hours 7:00am-4:30pm or 7:00am -11:30am on weekends

## 2019-11-10 NOTE — ED Provider Notes (Addendum)
   Patient transferred from [facility] with CT evidence of Fournier's gangrene.  Urology called.  On arrival, he is hemodynamically stable.  He is only complaining of groin pain.  He was made n.p.o.  Pain and nausea medicine provided.  Antibiotics are ongoing.  Physical Exam  BP (!) 104/58 (BP Location: Right Arm)   Pulse 86   Temp 99.7 F (37.6 C)   Resp (!) 22   SpO2 98%   Physical Exam Obese, non-ill-appearing, no acute distress, ABCs intact Foley catheter in place, no overlying skin change  ED Course/Procedures   Clinical Course as of Nov 10 315  Tue Nov 10, 2019  0034 Spoke with Dr. Claudia Desanctis, urology and Dr. Johney Maine, general surgery.  Both will evaluate the patient for surgical management.  Dr. Johney Maine requesting insulin administration for uncontrolled hyperglycemia.  Additionally, Dr. Claudia Desanctis requesting hospitalist admission.  Will allow them to evaluate and then call hospitalist.   [CH]  0100 Patient being evaluated by Dr. Claudia Desanctis and Dr. Johney Maine.   [CH]  0139 Patient taken to the [place] per nurse.  Plan was not relayed to me.  Unclear whether needs involvement of hospitalist.  They will need to call the hospitalist if they would like hospitalist admit or consult following the OR.   [CH]  0315 Spoke again with Dr. Johney Maine, have offered to contact hospitalist on his behalf.  Spoke with Dr. Cyd Silence has agreed to assess the patient for admission.   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    -  Primary   Relevant Medications   clindamycin (CLEOCIN) IVPB 900 mg (Completed)   ceFEPIme (MAXIPIME) 2 g in sodium chloride 0.9 % 100 mL IVPB (Completed)   ceFEPIme (MAXIPIME) 2 g in sodium chloride 0.9 % 100 mL IVPB (Start on 11/10/2019  6:00 AM)   vancomycin (VANCOCIN) IVPB 1000 mg/200 mL premix (Start on 11/10/2019 10:00 AM)   clindamycin  (CLEOCIN) IVPB 900 mg (Start on 11/10/2019  6:00 AM)   fluconazole (DIFLUCAN) tablet 200 mg (Start on 11/10/2019  1:45 AM)   nystatin cream (MYCOSTATIN) (Start on 11/10/2019  1:45 AM)   magic mouthwash   AKI (acute kidney injury) ([place])       Non-intractable vomiting with nausea, unspecified vomiting type                Merryl Hacker, MD 11/10/19 [id number]    Merryl Hacker, MD 11/10/19 [phone]

## 2019-11-10 NOTE — Transfer of Care (Signed)
Immediate Anesthesia Transfer of Care Note  Patient: Gary Butler  Procedure(s) Performed: INCISION AND DRAINAGE OF HIGH SCROTAL ABSCESS (N/A Scrotum) ANORECTAL EXAM UNDER ANESTHESIA (N/A Rectum)  Patient Location: PACU  Anesthesia Type:General  Level of Consciousness: awake, alert , oriented and patient cooperative  Airway & Oxygen Therapy: Patient Spontanous Breathing and Patient connected to face mask oxygen  Post-op Assessment: Report given to RN, Post -op Vital signs reviewed and stable and Patient moving all extremities X 4  Post vital signs: stable  Last Vitals:  Vitals Value Taken Time  BP 123/65 11/10/19 0315  Temp 36.8 C 11/10/19 0315  Pulse 91 11/10/19 0327  Resp 20 11/10/19 0327  SpO2 100 % 11/10/19 0327  Vitals shown include unvalidated device data.  Last Pain:  Vitals:   11/10/19 0315  PainSc: 0-No pain         Complications: No complications documented.

## 2019-11-10 NOTE — Consult Note (Addendum)
Gary Butler  Mar 03, 1945 [id number]  CARE TEAM:  PCP: [name], MD  Outpatient Care Team: Patient Care Team: [name], MD as PCP - General (Family Medicine) [place], Jennefer Bravo, MD as Referring Physician (Dermatology) Sharyne Peach, MD as Consulting Physician (Ophthalmology) Hennie Duos, MD as Consulting Physician (Rheumatology) Kathie Rhodes, MD as Consulting Physician (Urology) Tanda Rockers, MD as Consulting Physician (Pulmonary Disease) Troy Sine, MD as Consulting Physician (Cardiology) Paralee Cancel, MD as Consulting Physician (Orthopedic Surgery) Cameron Sprang, MD as Consulting Physician (Neurology) Marzetta Board, DPM as Consulting Physician (Podiatry) Renelda Mom, MD as Referring Physician (Colon and Rectal Surgery) Richmond Campbell, MD as Consulting Physician (Gastroenterology)  Inpatient Treatment Team: Treatment Team: Attending Provider: Merryl Hacker, MD; Consulting Physician: Nolon Nations, MD; Technician: Owens Shark, EMT; Registered Nurse: Newman Pies, RN; Respiratory Therapist: Nelly Laurence, RRT; Consulting Physician: Robley Fries, MD   This patient is a 74 y.o.male who presents today for surgical evaluation at the request of Dr Rex Kras. [facility] ED  Chief complaint / Reason for evaluation: Scrotal abscess, possible Fournier's gangrene  Morbidly obese diabetic male who has had complaints of loose stool and "explosive" diarrhea for the past few months.  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 Drue Flirt through [facility].   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  Superficial fistulotomy done and healed within a month.   Patient claims that "nothing has been done" about his diarrhea, but primary care has done C. difficile studies.  Gastrology checked fecal  lactoferrin and I believe there was a plan to do colonoscopy this month.  Again the record, Dr. Earlean Shawl has been trying to get a colonoscopy done on this patient but I do not know if it is actually happen.  Patient felt worse and went to emergency room 10 days ago.  Underwhelming work-up.  Patient's had some worsening pain behind his scrotum.  Intensified.  Brought by his wife to the emergency room at [facility].  Given concern of pain despite underwhelming examination, CT scan done.  3 cm pocket of gas noted posterior to the scrotum in the perineal region.  Urology and general surgical consultations requested.  Transferred from [facility].  Patient is by himself.  Just seen by Dr. Claudia Desanctis with urology.  Foley catheter placed since patient had very large bladder and difficulty urinating.  Nurse at bedside.  Patient is not a very reliable historian.  He is a diabetic on no insulin.  Hemoglobin A1c's run in the 9s.  Glucose in the mid 200s tonight.  Patient does not recall any abdominal surgery or other anorectal surgery.  Assessment  Stephanos Fan  74 y.o. male       Problem List:  Active Problems:   Poorly controlled diabetes mellitus ([place])   Type 2 diabetes mellitus with diabetic neuropathy, unspecified ([place])   Deep gas pocket on perineum suspicious for abscess.  Plan:  Agree with Dr Keane Scrape recommendation with medical admission and stabilization.  Dr. Dina Rich with [facility] ED called and I discussed with her.  We are in agreement with medical admission  I think he would benefit from examination under anesthesia with probable incision and drainage of what looks like a deep abscess in his perineum.  Discussed with Dr. Claudia Desanctis with  urology who is also in the emergency room.  I do not see a strong reason for both of [place] to be there.  Given his history of a prior perirectal fistula, I offered to go and proceed with surgery.  Have urology be involved if it gets more complicated.  It does not look like a  large abscess and he is not in shock with severe fevers and no evidence of any skin gangrene or rapid progression; yet, do not think he can wait that long.  The abscess seems to be in the high perineum and he has urinary retention as a result there is not much operative time in the morning, so we will try and get it done tonight.  Discussed with the patient with recommendations of examination under anesthesia with drainage of abscess and bloody position.  He wishes to be aggressive and proceed with surgery.  He seemed a little bit tangential and confused sometimes but could be redirected and did understand and wished to proceed with surgery.  Questions answered.  He expressed understanding and appreciation  The anatomy and physiology of the region was discussed. The pathophysiology of subcutaneous abscess formation with progression to fasciitis & sepsis was discussed.  Need for incision, drainage, debridement discussed.  I stressed good hygiene & need for repeated wound care.  Possible redebridement / reoperation was discussed as well. Possibility of recurrence was discussed.   Risks of bleeding, infection, abscess, leak, injury to other organs, need for repair of tissues / organs, need for further treatment, heart attack, death, and other risks were discussed.  Benefits, alternatives were discussed. I noted a good likelihood this will help address the problem.  Questions answered.  The patient agrees to proceed.  IV antibiotics.  Normally I would do IV Zosyn, but he claims to have a penicillin allergy.  We will do IV cefazolin along with Vancomycin given h/o MRSA in 2015.  I believe he received a dose allergy.  If positive culture, then keep on vancomycin and regroup  Diabetic control.  Agree with medicine admission to help sort him out and stabilize him given his other medical problems.  Wrote for sliding scale insulin.  His glucose is not in the DKA range, will so will start with sliding scale until  medicine can evaluate  HTN control  Agree with Foley catheter.  Most likely has urinary retention as result of a painful abscess.   Hopefully can remove in 72 hours once the abscess is drained.  Urinalysis underwhelming but in this diabetic patient with urinary problems, would send culture.  Stool studies.  Was C. difficile negative.  There is discussion about colonoscopy being done.  Do not know if that happened and patient cannot recall.   Moderate candidal rash.  We will give fluconazole x1 and topical therapy as well  Sleep apnea.  Oxygen or CPAP as needed perioperatively  VTE prophylaxis- SCDs, etc  Mobilize as tolerated to help recovery  40 minutes spent in review, evaluation, examination, counseling, and coordination of care.  More than 50% of that time was spent in counseling.  Adin Hector, MD, FACS, MASCRS Gastrointestinal and Minimally Invasive Surgery  [facility] [address] [place], [place] [address] [phone] Fax [phone] Main/Paging  CONTACT INFORMATION: Weekday (9AM-5PM) concerns: Call [facility] main office at [phone] Weeknight (5PM-9AM) or Weekend/Holiday concerns: Check [email or web address] for General Surgery [facility] coverage (Please, do not use SecureChat as it is not reliable communication to operating surgeons for  immediate patient care)      11/10/2019      Past Medical History:  Diagnosis Date  . Arthritis   . Constipation   . Diabetes mellitus   . Heart murmur   . History of gout   . History of skin cancer   . Hypertension   . Morbid (severe) obesity due to excess calories ([place])   . OSA (obstructive sleep apnea)   . Psoriatic arthritis ([place])   . Rectal fissure   . Sleep apnea   . Tinnitus     Past Surgical History:  Procedure Laterality Date  . addenoids    . JOINT REPLACEMENT  2012   RT KNEE  . LUNG BIOPSY     20 YRS AGO  . TONSILLECTOMY    . 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 Marital status: Married    Spouse name: Not on file  . Number of children: 2  . Years of education: Not on file  . Highest education level: Not on file  Occupational History  . 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 Smoking status: Never Smoker  . Smokeless tobacco: Never Used  Vaping Use  . Vaping Use: Never used  Substance and Sexual Activity  . Alcohol use: Yes    Alcohol/week: 0.0 standard drinks    Comment: Once a month  . Drug use: No  . Sexual activity: Never  Other Topics Concern  . Not on file  Social History Narrative   Alcohol- 1 to 2 per month.    Social Determinants of Health   Financial Resource Strain:   . Difficulty of Paying Living Expenses: Not on file  Food Insecurity:   . Worried About Charity fundraiser in the Last Year: Not on file  . Ran Out of Food in the Last Year: Not on file  Transportation Needs:   . Lack of Transportation (Medical): Not on file  . Lack of Transportation (Non-Medical): Not on file  Physical Activity:   . Days of Exercise per Week: Not on file  . Minutes of Exercise per Session: Not on file  Stress:   . Feeling of Stress : Not on file  Social Connections:   . Frequency of Communication with Friends and Family: Not on file  . Frequency of Social Gatherings with Friends and Family: Not on file  . Attends Religious Services: Not on file  . Active Member of Clubs or Organizations: Not on file  . Attends Archivist Meetings: Not on file  . Marital Status: Not on file  Intimate Partner Violence:   . Fear of Current or Ex-Partner: Not on file  . Emotionally Abused: Not on file  . Physically Abused: Not on file   . Sexually Abused: Not on file    Family History  Problem Relation Age of Onset  . Emphysema Father   . Clotting disorder Father   . Arthritis Father   . Heart disease Mother   . Arthritis Mother   . Arthritis Maternal Grandmother   . Hypertension Maternal Grandmother   . Diabetes Maternal Grandmother   . Arthritis Paternal Grandmother   . Diabetes Maternal Aunt   .  Diabetes Paternal Aunt     Current Facility-Administered Medications  Medication Dose Route Frequency Provider Last Rate Last Admin  . ceFEPIme (MAXIPIME) 2 g in sodium chloride 0.9 % 100 mL IVPB  2 g Intravenous Q8H Mancheril, Darnell Level, RPH      . insulin aspart (novoLOG) injection 0-20 Units  0-20 Units Subcutaneous Dorise Hiss, MD   11 Units at 11/10/19 0015  . vancomycin (VANCOCIN) IVPB 1000 mg/200 mL premix  1,000 mg Intravenous Q12H Mancheril, Darnell Level, [facility]       Current Outpatient Medications  Medication Sig Dispense Refill  . Albuterol Sulfate (PROAIR RESPICLICK) 268 (90 Base) MCG/ACT AEPB Inhale 2 puffs into the lungs every 6 (six) hours as needed (for wheezing or shortness of breath). 1 each 3  . allopurinol (ZYLOPRIM) 300 MG tablet TAKE 1 TABLET(300 MG) BY MOUTH DAILY (Patient taking differently: Take 300 mg by mouth daily. ) 30 tablet 0  . aspirin 81 MG tablet Take 81 mg by mouth daily.    Marland Kitchen atorvastatin (LIPITOR) 40 MG tablet TAKE 1 TABLET(40 MG) BY MOUTH EVERY EVENING (Patient taking differently: Take 40 mg by mouth daily. ) 90 tablet 1  . bisoprolol (ZEBETA) 5 MG tablet TAKE 1 TABLET(5 MG) BY MOUTH DAILY (Patient taking differently: Take 5 mg by mouth daily. ) 30 tablet 3  . cetirizine (ZYRTEC) 10 MG tablet TAKE 1 TABLET(10 MG) BY MOUTH DAILY (Patient taking differently: Take 10 mg by mouth daily. ) 30 tablet 0  . Cholecalciferol (VITAMIN D) 2000 UNITS tablet Take 2,000 Units by mouth daily.    . Cyanocobalamin (B-12) 2000 MCG TABS Take 1 tablet by mouth daily.     Marland Kitchen donepezil (ARICEPT) 10 MG  tablet Take 1 tablet (10 mg total) by mouth at bedtime. 30 tablet 3  . empagliflozin (JARDIANCE) 10 MG TABS tablet Take 10 mg by mouth daily before breakfast. 30 tablet 6  . etodolac (LODINE) 400 MG tablet Take by mouth.    . fluocinonide cream (LIDEX) 0.05 %     . fluticasone (FLONASE) 50 MCG/ACT nasal spray Place 2 sprays into both nostrils daily. 16 g 6  . folic acid (FOLVITE) 1 MG tablet Take 1 mg by mouth daily.     . furosemide (LASIX) 20 MG tablet TAKE 1 TABLET(20 MG) BY MOUTH DAILY (Patient taking differently: Take 20 mg by mouth daily. ) 90 tablet 1  . meloxicam (MOBIC) 15 MG tablet Take 1 tablet by mouth daily. 90 tablet 1  . MYRBETRIQ 25 MG TB24 tablet     . nystatin ointment (MYCOSTATIN) Apply 1 application topically 2 (two) times daily. (Patient taking differently: Apply 1 application topically 2 (two) times daily as needed. ) 90 g 1  . omeprazole (PRILOSEC) 40 MG capsule TAKE 1 CAPSULE BY MOUTH 30 TO 60 MINUTES BEFORE YOUR FIRST AND LAST MEAL OF THE DAY (Patient taking differently: Take 40 mg by mouth daily. 30 TO 60 MINUTES BEFORE YOUR FIRST AND LAST MEAL OF THE DAY) 180 capsule 0  . potassium chloride (MICRO-K) 10 MEQ CR capsule TAKE 1 CAPSULE BY MOUTH EVERY DAY (Patient taking differently: Take 10 mEq by mouth daily. ) 90 capsule 1  . sertraline (ZOLOFT) 50 MG tablet TAKE 1 TABLET(50 MG) BY MOUTH DAILY. (Patient taking differently: Take 50 mg by mouth daily. ) 90 tablet 1  . telmisartan (MICARDIS) 40 MG tablet TAKE 1 TABLET(40 MG) BY MOUTH DAILY (Patient taking differently: Take 40 mg by mouth daily. ) 30  tablet 11  . tiZANidine (ZANAFLEX) 4 MG tablet Take 1 tablet (4 mg total) by mouth every 6 (six) hours as needed for muscle spasms. 30 tablet 0  . triamterene-hydrochlorothiazide (MAXZIDE) 75-50 MG tablet Take 0.5 tablets by mouth every morning. Please call the office to schedule an appointment 45 tablet 1  . UNABLE TO FIND Med Name: BiPap with [facility]       Allergies  Allergen  Reactions  . Lyrica [Pregabalin] Swelling  . Penicillins Swelling  . Levofloxacin Rash    ROS:   All other systems reviewed & are negative except per HPI or as noted below: Constitutional:  No fevers, chills, sweats.  Weight stable Eyes:  No vision changes, No discharge HENT:  No sore throats, nasal drainage Lymph: No neck swelling, No bruising easily Pulmonary:  No cough, productive sputum CV: No orthopnea, PND  Patient walks 10 minutes with difficulty.  No exertional chest/neck/shoulder/arm pain. GI:  No personal nor family history of GI/colon cancer, inflammatory bowel disease, irritable bowel syndrome, allergy such as Celiac Sprue, dietary/dairy problems, colitis, ulcers nor gastritis.  No recent sick contacts/gastroenteritis.  No travel outside the country.  No changes in diet. Renal: No UTIs, No hematuria Genital:  No drainage, bleeding, masses Musculoskeletal: No severe joint pain.  Good ROM major joints Skin:  No sores or lesions.  No rashes Heme/Lymph:  No easy bleeding.  No swollen lymph nodes Neuro: No focal weakness/numbness.  No seizures Psych: No suicidal ideation.  No hallucinations  BP (!) 117/101   Pulse 87   Temp 99.7 F (37.6 C)   Resp (!) 22   SpO2 100%   Physical Exam: Constitutional: Not cachectic.  Hygeine adequate.  Vitals signs as above.   Eyes: Pupils reactive, normal extraocular movements. Sclera nonicteric Neuro: CN II-XII intact.  No major focal sensory defects.  No major motor deficits. Lymph: No head/neck/groin lymphadenopathy Psych:  No severe agitation.  No severe anxiety.  Judgment & insight Adequate, Oriented x3, HENT: Normocephalic, Mucus membranes moist.  No thrush.   Neck: Supple, No tracheal deviation.  No obvious thyromegaly Chest: No pain to chest wall compression.  Good respiratory excursion.  No audible wheezing CV:  Pulses intact.  Regular rhythm.  No major extremity edema Abdomen:  Soft.  Nondistended.  Nontender. No incarcerated  hernias.  No hepatomegaly.  No splenomegaly  Gen:  No inguinal hernias.  No inguinal lymphadenopathy.  Bright red rash in scrotum and mons pubis.  Foley catheter in place with clear colorless yellow urine.  No obvious draining opening or sinus.  Some fullness and tenderness along the midline raphae and possible left anterior perirectal region.  No definite fluctuance.  He does have pain and vague fullness that seems to localize as expected with the gas pocket noted on CT scan in the high perineum.  Scrotum without any swelling or abscesses.  Testes not particularly enlarged.  Dr. Claudia Desanctis had done digital rectal exam and did not feel any thrombosed hemorrhoid or obvious perirectal abscess.  I note no fissure or fistula.  No obvious draining sinus.  No external hemorrhoids.  Mild perianal moisture.  Ext: No obvious deformity or contracture no significant edema.  No cyanosis Skin: No major subcutaneous nodules.  Warm and dry Musculoskeletal: Severe joint rigidity not present.  No obvious clubbing.  No digital petechiae.     Results:   Labs: Results for orders placed or performed during the hospital encounter of 11/09/19 (from the past 48 hour(s))  CBG monitoring,  ED     Status: Abnormal   Collection Time: 11/09/19  6:31 PM  Result Value Ref Range   Glucose-Capillary 223 (H) 70 - 99 mg/dL    Comment: Glucose reference range applies only to samples taken after fasting for at least 8 hours.  Comprehensive metabolic panel     Status: Abnormal   Collection Time: 11/09/19  6:33 PM  Result Value Ref Range   Sodium 132 (L) 135 - 145 mmol/L   Potassium 3.3 (L) 3.5 - 5.1 mmol/L   Chloride 94 (L) 98 - 111 mmol/L   CO2 21 (L) 22 - 32 mmol/L   Glucose, Bld 259 (H) 70 - 99 mg/dL    Comment: Glucose reference range applies only to samples taken after fasting for at least 8 hours.   BUN 19 8 - 23 mg/dL   Creatinine, Ser 1.50 (H) 0.61 - 1.24 mg/dL   Calcium 9.3 8.9 - 10.3 mg/dL   Total Protein 6.8 6.5 - 8.1  g/dL   Albumin 3.3 (L) 3.5 - 5.0 g/dL   AST 25 15 - 41 U/L   ALT 15 0 - 44 U/L   Alkaline Phosphatase 92 38 - 126 U/L   Total Bilirubin 1.4 (H) 0.3 - 1.2 mg/dL   GFR calc non Af Amer 45 (L) >60 mL/min   GFR calc Af Amer 52 (L) >60 mL/min   Anion gap 17 (H) 5 - 15    Comment: Performed at [facility], [address] [place], [place] [address]  CBC with Differential     Status: Abnormal   Collection Time: 11/09/19  6:33 PM  Result Value Ref Range   WBC 16.7 (H) 4.0 - 10.5 K/uL   RBC 5.14 4.22 - 5.81 MIL/uL   Hemoglobin 16.3 13.0 - 17.0 g/dL   HCT 46.7 39 - 52 %   MCV 90.9 80.0 - 100.0 fL   MCH 31.7 26.0 - 34.0 pg   MCHC 34.9 30.0 - 36.0 g/dL   RDW 15.5 11.5 - 15.5 %   Platelets 277 150 - 400 K/uL   nRBC 0.0 0.0 - 0.2 %   Neutrophils Relative % 91 %   Neutro Abs 15.3 (H) 1.7 - 7.7 K/uL   Lymphocytes Relative 3 %   Lymphs Abs 0.5 (L) 0.7 - 4.0 K/uL   Monocytes Relative 5 %   Monocytes Absolute 0.8 0 - 1 K/uL   Eosinophils Relative 0 %   Eosinophils Absolute 0.0 0 - 0 K/uL   Basophils Relative 0 %   Basophils Absolute 0.1 0 - 0 K/uL   Immature Granulocytes 1 %   Abs Immature Granulocytes 0.12 (H) 0.00 - 0.07 K/uL    Comment: Performed at [facility], [address] [place], [place] [address]  Troponin I (High Sensitivity)     Status: None   Collection Time: 11/09/19  6:33 PM  Result Value Ref Range   Troponin I (High Sensitivity) 14 <18 ng/L    Comment: (NOTE) Elevated high sensitivity troponin I (hsTnI) values and significant  changes across serial measurements may suggest ACS but many other  chronic and acute conditions are known to elevate hsTnI results.  Refer to the "Links" section for chest pain algorithms and additional  guidance. Performed at [facility], [address] [place], [place] [address]   Lactic acid, plasma     Status: Abnormal   Collection Time: 11/09/19  6:33 PM  Result Value  Ref Range   Lactic Acid, Venous 3.0  (HH) 0.5 - 1.9 mmol/L    Comment: CRITICAL RESULT CALLED TO, READ BACK BY AND VERIFIED WITH: SIMMS MARVA, RN @ [place] ON 11/09/2019, [email or web address] Performed at [facility], [address] [place], [place] [address]   Lipase, blood     Status: None   Collection Time: 11/09/19  6:33 PM  Result Value Ref Range   Lipase 22 11 - 51 U/L    Comment: Performed at [facility], [address] [place], [place] [address]  Respiratory Panel by RT PCR (Flu A&B, Covid) - Nasopharyngeal Swab     Status: None   Collection Time: 11/09/19  7:37 PM   Specimen: Nasopharyngeal Swab  Result Value Ref Range   SARS Coronavirus 2 by RT PCR NEGATIVE NEGATIVE    Comment: (NOTE) SARS-CoV-2 target nucleic acids are NOT DETECTED.  The SARS-CoV-2 RNA is generally detectable in upper respiratoy specimens during the acute phase of infection. The lowest concentration of SARS-CoV-2 viral copies this assay can detect is 131 copies/mL. A negative result does not preclude SARS-Cov-2 infection and should not be used as the sole basis for treatment or other patient management decisions. A negative result may occur with  improper specimen collection/handling, submission of specimen other than nasopharyngeal swab, presence of viral mutation(s) within the areas targeted by this assay, and inadequate number of viral copies (<131 copies/mL). A negative result must be combined with clinical observations, patient history, and epidemiological information. The expected result is Negative.  Fact Sheet for Patients:  PinkCheek.be  Fact Sheet for Healthcare Providers:  GravelBags.it  This test is no t yet approved or cleared by the [place] FDA and  has been authorized for detection and/or diagnosis of SARS-CoV-2 by FDA under an Emergency Use Authorization (EUA). This EUA will remain  in effect (meaning this test can be used) for the duration of  the COVID-19 declaration under Section 564(b)(1) of the Act, 21 U.S.C. section 360bbb-3(b)(1), unless the authorization is terminated or revoked sooner.     Influenza A by PCR NEGATIVE NEGATIVE   Influenza B by PCR NEGATIVE NEGATIVE    Comment: (NOTE) The Xpert Xpress SARS-CoV-2/FLU/RSV assay is intended as an aid in  the diagnosis of influenza from Nasopharyngeal swab specimens and  should not be used as a sole basis for treatment. Nasal washings and  aspirates are unacceptable for Xpert Xpress SARS-CoV-2/FLU/RSV  testing.  Fact Sheet for Patients: PinkCheek.be  Fact Sheet for Healthcare Providers: GravelBags.it  This test is not yet approved or cleared by the [place] FDA and  has been authorized for detection and/or diagnosis of SARS-CoV-2 by  FDA under an Emergency Use Authorization (EUA). This EUA will remain  in effect (meaning this test can be used) for the duration of the  Covid-19 declaration under Section 564(b)(1) of the Act, 21  U.S.C. section 360bbb-3(b)(1), unless the authorization is  terminated or revoked. Performed at [facility], [address] [place], [place] [address]   Urinalysis, Routine w reflex microscopic Nasopharyngeal Swab     Status: Abnormal   Collection Time: 11/09/19  7:37 PM  Result Value Ref Range   Color, Urine YELLOW YELLOW   APPearance CLEAR CLEAR   Specific Gravity, Urine <1.005 (L) 1.005 - 1.030   pH 5.5 5.0 - 8.0   Glucose, UA >=500 (A) NEGATIVE mg/dL   Hgb urine dipstick NEGATIVE NEGATIVE   Bilirubin Urine NEGATIVE  NEGATIVE   Ketones, ur NEGATIVE NEGATIVE mg/dL   Protein, ur NEGATIVE NEGATIVE mg/dL   Nitrite NEGATIVE NEGATIVE   Leukocytes,Ua NEGATIVE NEGATIVE    Comment: Performed at [facility], [address] [place], [place] [address]  Urinalysis, Microscopic (reflex)     Status: Abnormal   Collection Time: 11/09/19  7:37 PM  Result Value  Ref Range   RBC / HPF 0-5 0 - 5 RBC/hpf   WBC, UA 0-5 0 - 5 WBC/hpf   Bacteria, UA RARE (A) NONE SEEN   Squamous Epithelial / LPF 0-5 0 - 5    Comment: Performed at [facility], [address] [place], [place] [address]  Lactic acid, plasma     Status: Abnormal   Collection Time: 11/09/19 10:39 PM  Result Value Ref Range   Lactic Acid, Venous 2.0 (HH) 0.5 - 1.9 mmol/L    Comment: CRITICAL RESULT CALLED TO, READ BACK BY AND VERIFIED WITH: NEAL,K AT 2257 ON [id number] BY CHERESNOWSKY,T Performed at [facility], [address] [place], [place] [address]   CBG monitoring, ED     Status: Abnormal   Collection Time: 11/10/19 12:06 AM  Result Value Ref Range   Glucose-Capillary 255 (H) 70 - 99 mg/dL    Comment: Glucose reference range applies only to samples taken after fasting for at least 8 hours.    Imaging / Studies: MR Brain Wo Contrast  Result Date: 10/29/2019 CLINICAL DATA:  Memory loss.  Dementia. EXAM: MRI HEAD WITHOUT CONTRAST TECHNIQUE: Multiplanar, multiecho pulse sequences of the brain and surrounding structures were obtained without intravenous contrast. COMPARISON:  02/28/2006 FINDINGS: Brain: Generalized atrophy with relative frontal and temporal predominance. Diffusion imaging does not show any acute or subacute infarction. Mild chronic small-vessel ischemic changes affect pons. No focal cerebellar finding. Cerebral hemispheres show moderate chronic small-vessel ischemic change of the white matter. No cortical or large vessel territory infarction. No mass lesion, hemorrhage, hydrocephalus or extra-axial collection. Vascular: Major vessels at the base of the brain show flow. Skull and upper cervical spine: Negative Sinuses/Orbits: Clear/normal Other: None IMPRESSION: No acute or reversible finding. Generalized atrophy with relative frontal and temporal predominance. Moderate chronic small-vessel ischemic changes of the cerebral hemispheric white matter.  Electronically Signed   By: Nelson Chimes M.D.   On: 10/29/2019 10:53   CT Abdomen Pelvis W Contrast  Result Date: 11/09/2019 CLINICAL DATA:  Bowel obstruction suspected vomiting since esophageal dilation, unable to tolerate PO Increasing weakness. EXAM: CT ABDOMEN AND PELVIS WITH CONTRAST TECHNIQUE: Multidetector CT imaging of the abdomen and pelvis was performed using the standard protocol following bolus administration of intravenous contrast. CONTRAST:  50mL OMNIPAQUE IOHEXOL 300 MG/ML  SOLN COMPARISON:  CT 11 days ago 10/29/2019 FINDINGS: Lower chest: Again seen subpleural fat at the lung bases. No pleural fluid. No lower pneumomediastinum or esophageal wall thickening. No focal consolidation. Hepatobiliary: Decreased hepatic density consistent with steatosis. Previous geographic fatty distribution is more homogeneous on the current exam, likely related to differences in contrast timing. There is no discrete focal hepatic lesion. Layering gallstones without pericholecystic inflammation. There is no biliary dilatation. No visualized choledocholithiasis. Pancreas: Fatty atrophy.  No ductal dilatation or inflammation. Spleen: Normal in size without focal abnormality. Adrenals/Urinary Tract: Normal right adrenal gland. Punctate left adrenal calcification unchanged. No hydronephrosis or perinephric edema. Again seen thinning of bilateral renal parenchyma. There are bilateral renal cysts are unchanged since recent exam. The urinary bladder is distended. No  bladder wall thickening. Bladder(volume = 1580 cm^3). Stomach/Bowel: No distal esophageal wall thickening. Stomach is decompressed. Normal positioning of the duodenum and ligament of Treitz. Decompressed small bowel without obstruction or inflammation. Normal appendix. Small volume of stool throughout the colon. Sigmoid colon is tortuous. Distal colonic diverticulosis without focal diverticulitis. There is no colonic wall thickening or inflammation.  Vascular/Lymphatic: Aorto bi-iliac atherosclerosis without aortic aneurysm. The portal vein is patent. No acute vascular finding. No abdominopelvic adenopathy. Reproductive: Normal sized prostate gland, see details below regarding adjacent soft tissue air. Other: There is edema and soft tissue gas involving the left perineum extending to the base of the penis. Small perineal fluid collection measures 3.4 x 2.5 cm, series 2, image 87. Tracking soft tissue air extends superiorly along the inferior margin of the prostate gland. No abdominopelvic ascites. Musculoskeletal: Multilevel degenerative change throughout the spine. Degenerative change of the hips. No bony destruction or acute osseous abnormality. IMPRESSION: 1. No bowel obstruction. 2. Midline perineal abscess measuring 3.4 x 2.5 cm just below the base of the penis anterior to the rectum, with patchy soft tissue air tracking in the left perineum, into the subcutaneous tissues. Findings highly suspicious for Fournier's gangrene. 3. Distended urinary bladder without bladder wall thickening. 4. Hepatic steatosis. Cholelithiasis without gallbladder inflammation. 5. Colonic diverticulosis without diverticulitis. Aortic Atherosclerosis (ICD10-I70.0). These results were called by telephone at the time of interpretation on 11/09/2019 at 9:17 pm to Dr Apolonio Schneiders LITTLE , who verbally acknowledged these results. Electronically Signed   By: Keith Rake M.D.   On: 11/09/2019 21:18   CT Abdomen Pelvis W Contrast  Result Date: 10/29/2019 CLINICAL DATA:  Diarrhea and shortness of breath for 6 weeks. EXAM: CT ABDOMEN AND PELVIS WITH CONTRAST TECHNIQUE: Multidetector CT imaging of the abdomen and pelvis was performed using the standard protocol following bolus administration of intravenous contrast. CONTRAST:  156mL OMNIPAQUE IOHEXOL 300 MG/ML  SOLN COMPARISON:  Abdominal ultrasound 05/19/2012, 06/05/2011 FINDINGS: Lower chest: Subpleural fat noted in the lung bases. Few  bandlike areas of opacity, likely scarring or atelectasis. Lung bases are otherwise clear. Abundant mediastinal and pericardial fat noted as well. Cardiac size is within normal limits. No pericardial effusion. Hepatobiliary: Geographic regions of hypoattenuation throughout the liver likely reflect areas of hepatic steatosis/fatty infiltration. No focal concerning liver lesion. Smooth liver surface contour. Gallbladder contains multiple dependently layering calcified gallstones. No gallbladder wall thickening, pericholecystic fluid or inflammation. No visible calcified intraductal gallstones or biliary dilatation. Pancreas: Partial fatty replacement of the pancreas. No pancreatic ductal dilatation or surrounding inflammatory changes. Spleen: No frank splenomegaly.  No concerning splenic lesions. Adrenals/Urinary Tract: Calcification of the left adrenal gland could reflect sequela of prior infection or hemorrhage. No concerning adrenal lesion. Bilateral renal atrophy/cortical thinning. Multiple fluid attenuation cysts are present in both kidneys, largest is seen in the right kidney measuring up to 7.1 cm in the upper pole. No concerning focal renal lesions. No urolithiasis or hydronephrosis. Moderate bladder distension without other Tyreanna Bisesi bladder abnormality. Stomach/Bowel: Distal esophagus, stomach and duodenum are unremarkable. No small bowel thickening or dilatation. A normal appendix is visualized in the right lower quadrant. No proximal colonic thickening or dilatation. Numerous distal colonic diverticula and a segment of circumferentially thickened versus decompressed colon seen in the proximal sigmoid in a region of these diverticular but without acute pericolonic inflammation to suggest an active diverticulitis at this time. No evidence of bowel obstruction. Vascular/Lymphatic: Atherosclerotic calcifications within the abdominal aorta and branch vessels. No aneurysm or ectasia. No enlarged  abdominopelvic lymph  nodes. Reproductive: The prostate and seminal vesicles are unremarkable. Other: No abdominopelvic free fluid or free gas. No bowel containing hernias. Fat containing inguinal hernias bilaterally. Small fat containing umbilical hernia. Musculoskeletal: Mild dextrocurvature of the spine. Multilevel flowing anterior osteophytosis of the lower thoracic levels, compatible with features of diffuse idiopathic skeletal hyperostosis (DISH). Additional multilevel discogenic and facet degenerative changes in the lumbar levels as well. No mild-to-moderate degenerative changes in the hips and pelvis. No acute or suspicious osseous lesions. IMPRESSION: 1. Extensive distal colonic diverticulosis. Segment of circumferentially thickened versus decompressed colon seen in the proximal sigmoid in a region of these diverticula but without acute pericolonic inflammation. May reflect sequela of prior inflammation though should consider outpatient correlation with colonoscopy if not recently performed. 2. Cholelithiasis without evidence of acute cholecystitis. 3. Geographic regions of hypoattenuation throughout the liver likely reflect areas of hepatic steatosis/fatty infiltration. 4. Bilateral renal atrophy/cortical thinning. Multiple simple appearing cysts bilaterally. 5. Aortic Atherosclerosis (ICD10-I70.0). Electronically Signed   By: Lovena Le M.D.   On: 10/29/2019 18:42    Medications / Allergies: per chart  Antibiotics: Anti-infectives (From admission, onward)   Start     Dose/Rate Route Frequency Ordered Stop   11/10/19 1000  vancomycin (VANCOCIN) IVPB 1000 mg/200 mL premix        1,000 mg 200 mL/hr over 60 Minutes Intravenous Every 12 hours 11/09/19 2134     11/10/19 0600  ceFEPIme (MAXIPIME) 2 g in sodium chloride 0.9 % 100 mL IVPB        2 g 200 mL/hr over 30 Minutes Intravenous Every 8 hours 11/09/19 2134     11/09/19 2130  ceFEPIme (MAXIPIME) 2 g in sodium chloride 0.9 % 100 mL IVPB        2 g 200 mL/hr over  30 Minutes Intravenous  Once 11/09/19 2128 11/09/19 2217   11/09/19 2130  vancomycin (VANCOCIN) IVPB 1000 mg/200 mL premix        1,000 mg 200 mL/hr over 60 Minutes Intravenous Every hour 11/09/19 2128 11/09/19 2333   11/09/19 2115  clindamycin (CLEOCIN) IVPB 900 mg        900 mg 100 mL/hr over 30 Minutes Intravenous  Once 11/09/19 2114 11/09/19 2147        Note: Portions of this report may have been transcribed using voice recognition software. Every effort was made to ensure accuracy; however, inadvertent computerized transcription errors may be present.   Any transcriptional errors that result from this process are unintentional.    Adin Hector, MD, FACS, MASCRS Gastrointestinal and Minimally Invasive Surgery  [facility] [address], [place], [place] [address] [phone] Fax [phone] Main/Paging  CONTACT INFORMATION: Weekday (9AM-5PM) concerns: Call [facility] main office at [phone] Weeknight (5PM-9AM) or Weekend/Holiday concerns: Check [email or web address] for General Surgery [facility] coverage (Please, do not use SecureChat as it is not reliable communication to operating surgeons for immediate patient care)      11/10/2019  12:38 AM

## 2019-11-11 ENCOUNTER — Encounter (HOSPITAL_COMMUNITY)
Admission: EM | Disposition: A | Discharge status: Skilled Nursing Facility | Payer: Self-pay | Source: Home / Self Care | Attending: Internal Medicine

## 2019-11-11 ENCOUNTER — Encounter (HOSPITAL_COMMUNITY): Payer: Self-pay | Admitting: Surgery

## 2019-11-11 ENCOUNTER — Inpatient Hospital Stay (HOSPITAL_COMMUNITY): Payer: Medicare PPO

## 2019-11-11 DIAGNOSIS — I4891 Unspecified atrial fibrillation: Secondary | ICD-10-CM

## 2019-11-11 DIAGNOSIS — K611 Rectal abscess: Secondary | ICD-10-CM | POA: Diagnosis not present

## 2019-11-11 LAB — COMPREHENSIVE METABOLIC PANEL
ALT: 12 U/L (ref 0–44)
AST: 16 U/L (ref 15–41)
Albumin: 2.6 g/dL — ABNORMAL LOW (ref 3.5–5.0)
Alkaline Phosphatase: 70 U/L (ref 38–126)
Anion gap: 11 (ref 5–15)
BUN: 19 mg/dL (ref 8–23)
CO2: 21 mmol/L — ABNORMAL LOW (ref 22–32)
Calcium: 8.4 mg/dL — ABNORMAL LOW (ref 8.9–10.3)
Chloride: 101 mmol/L (ref 98–111)
Creatinine, Ser: 1.14 mg/dL (ref 0.61–1.24)
GFR calc Af Amer: >60 mL/min (ref >60)
GFR calc non Af Amer: >60 mL/min (ref >60)
Glucose, Bld: 165 mg/dL — ABNORMAL HIGH (ref 70–99)
Potassium: 3.4 mmol/L — ABNORMAL LOW (ref 3.5–5.1)
Sodium: 133 mmol/L — ABNORMAL LOW (ref 135–145)
Total Bilirubin: 1.2 mg/dL (ref 0.3–1.2)
Total Protein: 5.4 g/dL — ABNORMAL LOW (ref 6.5–8.1)

## 2019-11-11 LAB — CBC WITH DIFFERENTIAL/PLATELET
Abs Immature Granulocytes: 0.14 10*3/uL — ABNORMAL HIGH (ref 0.00–0.07)
Basophils Absolute: 0 10*3/uL (ref 0.0–0.1)
Basophils Relative: 0 %
Eosinophils Absolute: 0 10*3/uL (ref 0.0–0.5)
Eosinophils Relative: 0 %
HCT: 40.9 % (ref 39.0–52.0)
Hemoglobin: 13.7 g/dL (ref 13.0–17.0)
Immature Granulocytes: 1 %
Lymphocytes Relative: 5 %
Lymphs Abs: 0.7 10*3/uL (ref 0.7–4.0)
MCH: 31.7 pg (ref 26.0–34.0)
MCHC: 33.5 g/dL (ref 30.0–36.0)
MCV: 94.7 fL (ref 80.0–100.0)
Monocytes Absolute: 0.7 10*3/uL (ref 0.1–1.0)
Monocytes Relative: 5 %
Neutro Abs: 13.5 10*3/uL — ABNORMAL HIGH (ref 1.7–7.7)
Neutrophils Relative %: 89 %
Platelets: 211 10*3/uL (ref 150–400)
RBC: 4.32 MIL/uL (ref 4.22–5.81)
RDW: 15.8 % — ABNORMAL HIGH (ref 11.5–15.5)
WBC: 15.1 10*3/uL — ABNORMAL HIGH (ref 4.0–10.5)
nRBC: 0 % (ref 0.0–0.2)

## 2019-11-11 LAB — ECHOCARDIOGRAM COMPLETE
Area-P 1/2: 2.26 cm2
Height: 70.984 in
Weight: 4846.59 oz

## 2019-11-11 LAB — GLUCOSE, CAPILLARY
Glucose-Capillary: 156 mg/dL — ABNORMAL HIGH (ref 70–99)
Glucose-Capillary: 132 mg/dL — ABNORMAL HIGH (ref 70–99)
Glucose-Capillary: 175 mg/dL — ABNORMAL HIGH (ref 70–99)
Glucose-Capillary: 202 mg/dL — ABNORMAL HIGH (ref 70–99)

## 2019-11-11 LAB — URINE CULTURE: Culture: >=100000 — AB

## 2019-11-11 SURGERY — IRRIGATION AND DEBRIDEMENT ABSCESS
Anesthesia: General

## 2019-11-11 MED ORDER — PERFLUTREN LIPID MICROSPHERE
1.0000 mL | INTRAVENOUS | Status: AC | PRN
  Administered 2019-11-11: 3 mL via INTRAVENOUS
  Filled 2019-11-11: qty 10

## 2019-11-11 MED ORDER — KATE FARMS STANDARD 1.4 PO LIQD
325.0000 mL | Freq: Two times a day (BID) | ORAL | Status: DC
  Administered 2019-11-12 – 2019-11-13 (×4): 325 mL via ORAL
  Filled 2019-11-11 (×12): qty 325

## 2019-11-11 MED ORDER — ENOXAPARIN SODIUM 80 MG/0.8ML ~~LOC~~ SOLN
70.0000 mg | SUBCUTANEOUS | Status: DC
  Administered 2019-11-11: 70 mg via SUBCUTANEOUS
  Filled 2019-11-11: qty 0.8

## 2019-11-11 MED ORDER — POTASSIUM CHLORIDE CRYS ER 20 MEQ PO TBCR
40.0000 meq | EXTENDED_RELEASE_TABLET | Freq: Two times a day (BID) | ORAL | Status: AC
  Administered 2019-11-11 (×2): 40 meq via ORAL
  Filled 2019-11-11 (×2): qty 2

## 2019-11-11 NOTE — Progress Notes (Addendum)
PROGRESS NOTE  Gary Butler UVO:[id number] DOB: 10/22/1945 DOA: 11/09/2019 PCP: [name], MD  HPI/Recap of past 24 hours: HPI: 74 year old male with past medical history of hyperlipidemia, hypertension, diabetes mellitus type 2, early dementia/mild cognitive impairment, gastroesophageal reflux disease and pertinent past medical history of a left-sided perianal fistula status post fistulotomy performed at [facility] in 2019. Patient presented to [place] emergency department with complaints of frequent bouts of watery diarrhea and rectal pain.  Patient explains that for at least the past 6 weeks he has been experiencing what he describes as explosive bouts of watery diarrhea. Is been occurring numerous times daily over this entire time. In the past several days to weeks, patient has additionally been experiencing progressively worsening rectal discomfort. Patient described this as sore in quality, moderate to severe in intensity and waxing and waning with bowel movements. Patient attributed this to frequent bouts of diarrhea.  Finally, patient also complains of associated frequent bouts of vomiting and inability to tolerate oral intake for the past several days. Of note, patient underwent recent EGD approximately 1 week ago with esophageal dilation.   Patient eventually came to [place] emergency department at the direction of his primary care provider.  Upon evaluation at [facility], patient underwent CT imaging of the abdomen and pelvis that revealed evidence of abscess formation of 3.4 x 2.5 cm just below the base of the penis. There is initially concern for Fournier's gangrene and therefore after discussion with urology and general surgery patient was transferred to the [facility] long ED for evaluation. Upon evaluation by Dr. Johney Maine and Dr. Claudia Desanctis it was felt the patient was suffering from a perineal abscess without Fournier's. Patient was taken to the  operating room at [facility] long by Dr. Johney Maine for an incision and drainage. The hospitalist group was then called to assess the patient for admission to the hospital afterwards for medical management of his infection and other comorbidities.   11/11/19: Reports peri anal pain 6/10 and some tenderness in his LE bilaterally.  Assessment/Plan: Principal Problem:   Suprasphincteric perirectal abscess s/p I&D 11/10/2019 Active Problems:   Obstructive sleep apnea   Essential hypertension   Uncontrolled type 2 diabetes mellitus with hyperglycemia, with long-term current use of insulin (HCC)   Acute urinary retention s/p Foley 11/10/2019   Diarrhea   Perineal abscess   GERD without esophagitis   Atrial fibrillation (HCC)  Suprasphincteric perirectal abscess s/p I&D 11/10/2019  Patient is status post successful incision and drainage by Dr. Johney Maine  Surgery following and assisting with education on dressing changes  Continuing broad-spectrum intravenous antibiotic therapy until cultures result -we will continue to provide intravenous cefepime, Flagyl and vancomycin for coverage of both MRSA and anaerobic organisms  Wound culture growing rare gram-negative bacteria, culture reincubated for better growth.  Patient has a history of a perianal fistula requiring fistulotomy in 2019 at [facility]. The appearance of the abscess is concerning for the development of another fistula.  Blood cultures negative to date  Pain control.  E. coli UTI Urine culture growing greater than 100,000 colonies of E. coli with resistance to ampicillin, intermediate resistance to ampicillin/sulbactam, ciprofloxacin, Bactrim. Continue cefepime.    Acute urinary retention s/p Foley 11/10/2019 No hydronephrosis or perinephric edema on CT scan abdomen and pelvis with contrast done on 11/09/2019. Patient exhibited acute urinary retention postop in the PACU, Foley catheter placed As patient clinically improves will attempt to  remove Foley catheter for  voiding trial on 11/12/2019.  New onset atrial fibrillation  Patient noted to be in atrial fibrillation on this presentation, new onset without previous history  Heart rate seems to be coming down however postoperatively without AV nodal blocking agents  Currently rate controlled on bisoprolol  Etiology of atrial fibrillation is likely infection however  TSH normal 1.1, troponin within normal limit 15  2D echo done on 11/11/2019 showed LVEF 60 to 65% with grade 2 diastolic dysfunction  Chadsvasc score of 3  Obtain 12-lead EKG if Afib present start DOAC.    Diarrhea  Patient reports 6 to 8-week history of diarrhea, etiology unclear  Who reports that patient has already seen gastroenterology recently but this seems to be for nausea and vomiting not for the diarrhea.  Patient did however have stool studies performed by his primary care provider a week or so ago that were negative for C. Difficile  No enteric precautions required  Review of medications reveals the patient is on atorvastatin which can frequently cause diarrhea, this was discontinued  We will monitor for recurrence of diarrhea postoperatively, if this continues patient may require a Flexi-Seal to avoid soiling the perineal wound.    Essential hypertension BPs are soft Currently on bisoprolol 5 mg daily and Maxzide 75/50 mg daily, 0.5 tablet daily. Continue to monitor vital signs    Uncontrolled type 2 diabetes mellitus with hyperglycemia,  Continue to hold off oral hypoglycemics  Accu-Cheks before every meal and nightly with sliding scale insulin  Recent hemoglobin A1c was found to be elevated at 9.1  Severe morbid obesity BMI 42 Recommend weight loss outpatient with regular physical activity and healthy dieting Will benefit from bariatric surgery for weight loss evaluation  Physical debility PT to assess Fall precautions.        Code Status:  Full code Family  Communication:  None at bedside.    Consultants:  General surgery  Procedures:  Post I&D  Antimicrobials:  IV vancomycin  Cefepime  IV Flagyl.  DVT prophylaxis: Subcu Lovenox daily  Status is: Inpatient    Dispo: The patient is from: Home              Anticipated d/c is to: Home with home health services versus SNF pending PT evaluation.              Anticipated d/c date is: 11/13/2019.               Patient currently not stable for discharge, ongoing management of perirectal wound.         Objective: Vitals:   11/11/19 0143 11/11/19 0557 11/11/19 0800 11/11/19 1642  BP: (!) 95/55 (!) 98/58  (!) 100/52  Pulse: (!) 51 (!) 56  (!) 58  Resp: 17 15  19   Temp:    98.9 F (37.2 C)  TempSrc:      SpO2: 94% 96% 97% 96%  Weight:      Height:        Intake/Output Summary (Last 24 hours) at 11/11/2019 1648 Last data filed at 11/11/2019 1611 Gross per 24 hour  Intake 3340 ml  Output 1900 ml  Net 1440 ml   Filed Weights   11/10/19 0835  Weight: (!) 137.4 kg    Exam:  . General: 74 y.o. year-old male well developed well nourished in no acute distress.  Alert and oriented x3. . Cardiovascular: Regular rate and rhythm with no rubs or gallops.  No thyromegaly or JVD noted.   Marland Kitchen Respiratory:  Clear to auscultation with no wheezes or rales. Good inspiratory effort. . Abdomen: Soft nontender nondistended with normal bowel sounds x4 quadrants. . Musculoskeletal: Mild tenderness with palpation of the lower extremities bilaterally.  Marland Kitchen Psychiatry: Mood is appropriate for condition and setting   Data Reviewed: CBC: Recent Labs  Lab 11/09/19 1833 11/10/19 0958 11/11/19 0647  WBC 16.7* 16.3* 15.1*  NEUTROABS 15.3* 15.3* 13.5*  HGB 16.3 14.3 13.7  HCT 46.7 42.3 40.9  MCV 90.9 93.8 94.7  PLT 277 223 654   Basic Metabolic Panel: Recent Labs  Lab 11/09/19 1833 11/10/19 0958 11/11/19 0647  NA 132* 130* 133*  K 3.3* 3.3* 3.4*  CL 94* 96* 101  CO2 21* 23 21*   GLUCOSE 259* 231* 165*  BUN 19 18 19   CREATININE 1.50* 1.22 1.14  CALCIUM 9.3 8.7* 8.4*  MG  --  1.8  --    GFR: Estimated Creatinine Clearance: 80.5 mL/min (by C-G formula based on SCr of 1.14 mg/dL). Liver Function Tests: Recent Labs  Lab 11/09/19 1833 11/10/19 0958 11/11/19 0647  AST 25 16 16   ALT 15 14 12   ALKPHOS 92 74 70  BILITOT 1.4* 1.4* 1.2  PROT 6.8 5.8* 5.4*  ALBUMIN 3.3* 2.7* 2.6*   Recent Labs  Lab 11/09/19 1833  LIPASE 22   No results for input(s): AMMONIA in the last 168 hours. Coagulation Profile: No results for input(s): INR, PROTIME in the last 168 hours. Cardiac Enzymes: No results for input(s): CKTOTAL, CKMB, CKMBINDEX, TROPONINI in the last 168 hours. BNP (last 3 results) No results for input(s): PROBNP in the last 8760 hours. HbA1C: No results for input(s): HGBA1C in the last 72 hours. CBG: Recent Labs  Lab 11/10/19 1151 11/10/19 1626 11/10/19 1959 11/11/19 0737 11/11/19 1140  GLUCAP 211* 218* 185* 156* 132*   Lipid Profile: No results for input(s): CHOL, HDL, LDLCALC, TRIG, CHOLHDL, LDLDIRECT in the last 72 hours. Thyroid Function Tests: Recent Labs    11/10/19 0958  TSH 1.141   Anemia Panel: No results for input(s): VITAMINB12, FOLATE, FERRITIN, TIBC, IRON, RETICCTPCT in the last 72 hours. Urine analysis:    Component Value Date/Time   COLORURINE YELLOW 11/09/2019 1937   APPEARANCEUR CLEAR 11/09/2019 1937   LABSPEC <1.005 (L) 11/09/2019 1937   PHURINE 5.5 11/09/2019 1937   GLUCOSEU >=500 (A) 11/09/2019 1937   HGBUR NEGATIVE 11/09/2019 1937   BILIRUBINUR NEGATIVE 11/09/2019 1937   KETONESUR NEGATIVE 11/09/2019 1937   PROTEINUR NEGATIVE 11/09/2019 1937   UROBILINOGEN 1.0 06/10/2013 1000   NITRITE NEGATIVE 11/09/2019 1937   LEUKOCYTESUR NEGATIVE 11/09/2019 1937   Sepsis Labs: @LABRCNTIP (procalcitonin:4,lacticidven:4)  ) Recent Results (from the past 240 hour(s))  Respiratory Panel by RT PCR (Flu A&B, Covid) -  Nasopharyngeal Swab     Status: None   Collection Time: 11/09/19  7:37 PM   Specimen: Nasopharyngeal Swab  Result Value Ref Range Status   SARS Coronavirus 2 by RT PCR NEGATIVE NEGATIVE Final    Comment: (NOTE) SARS-CoV-2 target nucleic acids are NOT DETECTED.  The SARS-CoV-2 RNA is generally detectable in upper respiratoy specimens during the acute phase of infection. The lowest concentration of SARS-CoV-2 viral copies this assay can detect is 131 copies/mL. A negative result does not preclude SARS-Cov-2 infection and should not be used as the sole basis for treatment or other patient management decisions. A negative result may occur with  improper specimen collection/handling, submission of specimen other than nasopharyngeal swab, presence of viral mutation(s) within the areas targeted by  this assay, and inadequate number of viral copies (<131 copies/mL). A negative result must be combined with clinical observations, patient history, and epidemiological information. The expected result is Negative.  Fact Sheet for Patients:  PinkCheek.be  Fact Sheet for Healthcare Providers:  GravelBags.it  This test is no t yet approved or cleared by the [place] FDA and  has been authorized for detection and/or diagnosis of SARS-CoV-2 by FDA under an Emergency Use Authorization (EUA). This EUA will remain  in effect (meaning this test can be used) for the duration of the COVID-19 declaration under Section 564(b)(1) of the Act, 21 U.S.C. section 360bbb-3(b)(1), unless the authorization is terminated or revoked sooner.     Influenza A by PCR NEGATIVE NEGATIVE Final   Influenza B by PCR NEGATIVE NEGATIVE Final    Comment: (NOTE) The Xpert Xpress SARS-CoV-2/FLU/RSV assay is intended as an aid in  the diagnosis of influenza from Nasopharyngeal swab specimens and  should not be used as a sole basis for treatment. Nasal washings and   aspirates are unacceptable for Xpert Xpress SARS-CoV-2/FLU/RSV  testing.  Fact Sheet for Patients: PinkCheek.be  Fact Sheet for Healthcare Providers: GravelBags.it  This test is not yet approved or cleared by the [place] FDA and  has been authorized for detection and/or diagnosis of SARS-CoV-2 by  FDA under an Emergency Use Authorization (EUA). This EUA will remain  in effect (meaning this test can be used) for the duration of the  Covid-19 declaration under Section 564(b)(1) of the Act, 21  U.S.C. section 360bbb-3(b)(1), unless the authorization is  terminated or revoked. Performed at [facility], [address] [place], [place] [address]   Urine culture     Status: Abnormal   Collection Time: 11/09/19  7:37 PM   Specimen: Urine, Random  Result Value Ref Range Status   Specimen Description   Final    URINE, RANDOM Performed at [facility], [address] [place], [place] [address]    Special Requests   Final    NONE Performed at [facility], [address] [place], [place] [address]    Culture >=100,000 COLONIES/mL ESCHERICHIA COLI (A)  Final   Report Status 11/11/2019 FINAL  Final   Organism ID, Bacteria ESCHERICHIA COLI (A)  Final      Susceptibility   Escherichia coli - MIC*    AMPICILLIN >=32 RESISTANT Resistant     CEFAZOLIN <=4 SENSITIVE Sensitive     CEFTRIAXONE <=0.25 SENSITIVE Sensitive     CIPROFLOXACIN >=4 RESISTANT Resistant     GENTAMICIN <=1 SENSITIVE Sensitive     IMIPENEM <=0.25 SENSITIVE Sensitive     NITROFURANTOIN <=16 SENSITIVE Sensitive     TRIMETH/SULFA >=320 RESISTANT Resistant     AMPICILLIN/SULBACTAM 16 INTERMEDIATE Intermediate     PIP/TAZO <=4 SENSITIVE Sensitive     * >=100,000 COLONIES/mL ESCHERICHIA COLI  Culture, blood (routine x 2)     Status: None (Preliminary result)   Collection Time: 11/09/19  8:01 PM   Specimen: BLOOD RIGHT HAND   Result Value Ref Range Status   Specimen Description   Final    BLOOD RIGHT HAND Performed at [facility], [address] [place], [place] [address]    Special Requests   Final    BOTTLES DRAWN AEROBIC AND ANAEROBIC Blood Culture results may not be optimal due to an inadequate volume of blood received in culture bottles Performed at [facility],  [place], [place] [address]    Culture   Final    NO GROWTH 2 DAYS Performed at [facility], [place] [address], [place], [place] [address]    Report Status PENDING  Incomplete  Culture, blood (routine x 2)     Status: None (Preliminary result)   Collection Time: 11/09/19  8:10 PM   Specimen: Right Antecubital; Blood  Result Value Ref Range Status   Specimen Description   Final    RIGHT ANTECUBITAL Performed at [facility], [address] [place], [place] [address]    Special Requests   Final    BOTTLES DRAWN AEROBIC AND ANAEROBIC Blood Culture adequate volume Performed at [facility], [address] [place], [place] [address]    Culture   Final    NO GROWTH 2 DAYS Performed at [facility], [place] [address] [place], [place] [address]    Report Status PENDING  Incomplete  Aerobic/Anaerobic Culture (surgical/deep wound)     Status: None (Preliminary result)   Collection Time: 11/10/19  3:30 AM   Specimen: Abscess  Result Value Ref Range Status   Specimen Description   Final    ABSCESS Performed at [place] [address] [place], [place] [address]    Special Requests   Final    NONE Performed at [facility], [place] [address] [place], [place] [address]    Gram Stain   Final    FEW WBC SEEN FEW GRAM POSITIVE COCCI Performed at [facility], [place] [address] [place], [place] [address]    Culture   Final    RARE Lonell Grandchild NEGATIVE RODS CULTURE REINCUBATED FOR BETTER GROWTH SUSCEPTIBILITIES TO  FOLLOW Performed at [facility], [place] [address], [place], [place] [address]    Report Status PENDING  Incomplete  MRSA PCR Screening     Status: None   Collection Time: 11/10/19  4:00 AM   Specimen: Nasopharyngeal  Result Value Ref Range Status   MRSA by PCR NEGATIVE NEGATIVE Final    Comment:        The GeneXpert MRSA Assay (FDA approved for NASAL specimens only), is one component of a comprehensive MRSA colonization surveillance program. It is not intended to diagnose MRSA infection nor to guide or monitor treatment for MRSA infections. Performed at [facility], [place] [address], [place],  [address]       Studies: ECHOCARDIOGRAM COMPLETE  Result Date: 11/11/2019    ECHOCARDIOGRAM REPORT   Patient Name:   HERBERTH DEHARO  Date of Exam: 11/11/2019 Medical Rec #:  [id number]  Height:       71.0 in Accession #:    [id number] Weight:       302.9 lb Date of Birth:  October 09, 1945  BSA:          2.515 m Patient Age:    50 years   BP:           98/58 mmHg Patient Gender: M          HR:           56 bpm. Exam Location:  Inpatient Procedure: 2D Echo Indications:    427.31 atrial fibrillation  History:        Patient has prior history of Echocardiogram examinations, most                 recent 10/11/2017. Risk  Factors:Hypertension and Diabetes.  Sonographer:    Jannett Celestine RDCS (AE) Referring Phys: [id number] Sherryll Burger [facility]  Sonographer Comments: Technically difficult study due to poor echo windows, no parasternal window and patient is morbidly obese. Image acquisition challenging due to patient body habitus, Image acquisition challenging due to respiratory motion and restricted mobility. IMPRESSIONS  1. Left ventricular ejection fraction, by estimation, is 60 to 65%. The left ventricle has normal function. The left ventricle has no regional wall motion abnormalities. Left ventricular diastolic parameters are consistent with Grade II diastolic dysfunction (pseudonormalization).   2. Right ventricular systolic function is normal. The right ventricular size is normal.  3. The mitral valve is normal in structure. No evidence of mitral valve regurgitation. No evidence of mitral stenosis.  4. The aortic valve is normal in structure. Aortic valve regurgitation is not visualized. No aortic stenosis is present.  5. The inferior vena cava is normal in size with greater than 50% respiratory variability, suggesting right atrial pressure of 3 mmHg. FINDINGS  Left Ventricle: Left ventricular ejection fraction, by estimation, is 60 to 65%. The left ventricle has normal function. The left ventricle has no regional wall motion abnormalities. Definity contrast agent was given IV to delineate the left ventricular  endocardial borders. The left ventricular internal cavity size was normal in size. There is no left ventricular hypertrophy. Left ventricular diastolic parameters are consistent with Grade II diastolic dysfunction (pseudonormalization). Right Ventricle: The right ventricular size is normal. No increase in right ventricular wall thickness. Right ventricular systolic function is normal. Left Atrium: Left atrial size was not well visualized. Right Atrium: Right atrial size was normal in size. Pericardium: There is no evidence of pericardial effusion. Mitral Valve: The mitral valve is normal in structure. No evidence of mitral valve regurgitation. No evidence of mitral valve stenosis. Tricuspid Valve: The tricuspid valve is not well visualized. Tricuspid valve regurgitation is trivial. No evidence of tricuspid stenosis. Aortic Valve: The aortic valve is normal in structure. Aortic valve regurgitation is not visualized. No aortic stenosis is present. Pulmonic Valve: The pulmonic valve was normal in structure. Pulmonic valve regurgitation is not visualized. No evidence of pulmonic stenosis. Aorta: The aortic root is normal in size and structure. Venous: The inferior vena cava is normal in size with  greater than 50% respiratory variability, suggesting right atrial pressure of 3 mmHg. IAS/Shunts: No atrial level shunt detected by color flow Doppler.   Diastology LV e' medial:    5.44 cm/s LV E/e' medial:  17.2 LV e' lateral:   6.53 cm/s LV E/e' lateral: 14.4  AORTIC VALVE LVOT Vmax:   58.10 cm/s LVOT Vmean:  42.500 cm/s LVOT VTI:    0.123 m MITRAL VALVE MV Area (PHT): 2.26 cm    SHUNTS MV Decel Time: 335 msec    Systemic VTI: 0.12 m MV E velocity: 93.80 cm/s MV A velocity: 38.60 cm/s MV E/A ratio:  2.43 Candee Furbish MD Electronically signed by Candee Furbish MD Signature Date/Time: 11/11/2019/3:31:15 PM    Final     Scheduled Meds: . acetaminophen  1,000 mg Oral TID  . allopurinol  300 mg Oral Daily  . aspirin EC  81 mg Oral Daily  . bisoprolol  5 mg Oral Daily  . Chlorhexidine Gluconate Cloth  6 each Topical Once   And  . Chlorhexidine Gluconate Cloth  6 each Topical Once  . cholecalciferol  2,000 Units Oral Daily  . donepezil  10 mg Oral QHS  . [START ON 11/12/2019] feeding  supplement (KATE FARMS STANDARD 1.4)  325 mL Oral BID BM  . fluconazole  200 mg Oral Daily  . fluticasone  2 spray Each Nare Daily  . folic acid  1 mg Oral Daily  . insulin aspart  0-20 Units Subcutaneous TID AC & HS  . lip balm  1 application Topical BID  . loratadine  10 mg Oral Daily  . nystatin cream   Topical BID  . pantoprazole  40 mg Oral Daily  . potassium chloride  40 mEq Oral BID  . psyllium  1 packet Oral BID  . sertraline  50 mg Oral Daily  . sodium chloride flush  3 mL Intravenous Q12H  . triamterene-hydrochlorothiazide  0.5 tablet Oral q morning - 10a  . vitamin B-12  2,000 mcg Oral Daily    Continuous Infusions: . sodium chloride    . ceFEPime (MAXIPIME) IV 2 g (11/11/19 1027)  . methocarbamol (ROBAXIN) IV    . metronidazole 500 mg (11/11/19 0813)  . vancomycin 1,000 mg (11/11/19 1151)     LOS: 1 day     Kayleen Memos, MD [facility] Pager [phone]  If 7PM-7AM, please  contact night-coverage [email or web address] Password [facility] 11/11/2019, 4:48 PM

## 2019-11-11 NOTE — Progress Notes (Signed)
  Echocardiogram 2D Echocardiogram has been performed.  Gary Butler 11/11/2019, 3:18 PM

## 2019-11-11 NOTE — Progress Notes (Signed)
dsg changed this am, mod amt serosang noted to pad, some frank bleeding when packing was removed. replaced and abd pads applied.

## 2019-11-11 NOTE — Progress Notes (Signed)
Patient ID: Gary Butler, male   DOB: 1945-08-19, 74 y.o.   MRN: [id number]    1 Day Post-Op  Subjective: No new complaints today except pain in his calves apparently when speaking to the medical doctor.  Dressing change went well over night.   ROS: See above, otherwise other systems negative  Objective: Vital signs in last 24 hours: Temp:  [98.2 F (36.8 C)] 98.2 F (36.8 C) (09/28 1032) Pulse Rate:  [51-67] 56 (09/29 0557) Resp:  [15-18] 15 (09/29 0557) BP: (95-102)/(52-59) 98/58 (09/29 0557) SpO2:  [94 %-96 %] 96 % (09/29 0557) Last BM Date: 11/04/19  Intake/Output from previous day: 09/28 0701 - 09/29 0700 In: 2360 [P.O.:240; I.V.:1220; IV Piggyback:900] Out: 1450 [Urine:1450] Intake/Output this shift: No intake/output data recorded.  PE: GU: perirectal wound packing was removed.  The wound is very clean with no purulent drainage noted.  No significant induration or erythema present  Lab Results:  Recent Labs    11/10/19 0958 11/11/19 0647  WBC 16.3* 15.1*  HGB 14.3 13.7  HCT 42.3 40.9  PLT 223 211   BMET Recent Labs    11/10/19 0958 11/11/19 0647  NA 130* 133*  K 3.3* 3.4*  CL 96* 101  CO2 23 21*  GLUCOSE 231* 165*  BUN 18 19  CREATININE 1.22 1.14  CALCIUM 8.7* 8.4*   PT/INR No results for input(s): LABPROT, INR in the last 72 hours. CMP     Component Value Date/Time   NA 133 (L) 11/11/2019 0647   NA 139 06/17/2013 0946   K 3.4 (L) 11/11/2019 0647   K 3.5 06/17/2013 0946   CL 101 11/11/2019 0647   CL 102 05/14/2012 0856   CO2 21 (L) 11/11/2019 0647   CO2 27 06/17/2013 0946   GLUCOSE 165 (H) 11/11/2019 0647   GLUCOSE 184 (H) 06/17/2013 0946   GLUCOSE 135 (H) 05/14/2012 0856   BUN 19 11/11/2019 0647   BUN 13.8 06/17/2013 0946   CREATININE 1.14 11/11/2019 0647   CREATININE 0.95 07/14/2015 1019   CREATININE 1.1 06/17/2013 0946   CALCIUM 8.4 (L) 11/11/2019 0647   CALCIUM 9.3 06/17/2013 0946   PROT 5.4 (L) 11/11/2019 0647   PROT 5.7 (L)  06/17/2013 0946   ALBUMIN 2.6 (L) 11/11/2019 0647   ALBUMIN 3.2 (L) 06/17/2013 0946   AST 16 11/11/2019 0647   AST 19 06/17/2013 0946   ALT 12 11/11/2019 0647   ALT 18 06/17/2013 0946   ALKPHOS 70 11/11/2019 0647   ALKPHOS 101 06/17/2013 0946   BILITOT 1.2 11/11/2019 0647   BILITOT 1.54 (H) 06/17/2013 0946   GFRNONAA >60 11/11/2019 0647   GFRAA >60 11/11/2019 0647   Lipase     Component Value Date/Time   LIPASE 22 11/09/2019 1833       Studies/Results: CT Abdomen Pelvis W Contrast  Result Date: 11/09/2019 CLINICAL DATA:  Bowel obstruction suspected vomiting since esophageal dilation, unable to tolerate PO Increasing weakness. EXAM: CT ABDOMEN AND PELVIS WITH CONTRAST TECHNIQUE: Multidetector CT imaging of the abdomen and pelvis was performed using the standard protocol following bolus administration of intravenous contrast. CONTRAST:  62mL OMNIPAQUE IOHEXOL 300 MG/ML  SOLN COMPARISON:  CT 11 days ago 10/29/2019 FINDINGS: Lower chest: Again seen subpleural fat at the lung bases. No pleural fluid. No lower pneumomediastinum or esophageal wall thickening. No focal consolidation. Hepatobiliary: Decreased hepatic density consistent with steatosis. Previous geographic fatty distribution is more homogeneous on the current exam, likely related to differences in contrast timing. There  is no discrete focal hepatic lesion. Layering gallstones without pericholecystic inflammation. There is no biliary dilatation. No visualized choledocholithiasis. Pancreas: Fatty atrophy.  No ductal dilatation or inflammation. Spleen: Normal in size without focal abnormality. Adrenals/Urinary Tract: Normal right adrenal gland. Punctate left adrenal calcification unchanged. No hydronephrosis or perinephric edema. Again seen thinning of bilateral renal parenchyma. There are bilateral renal cysts are unchanged since recent exam. The urinary bladder is distended. No bladder wall thickening. Bladder(volume = 1580 cm^3).  Stomach/Bowel: No distal esophageal wall thickening. Stomach is decompressed. Normal positioning of the duodenum and ligament of Treitz. Decompressed small bowel without obstruction or inflammation. Normal appendix. Small volume of stool throughout the colon. Sigmoid colon is tortuous. Distal colonic diverticulosis without focal diverticulitis. There is no colonic wall thickening or inflammation. Vascular/Lymphatic: Aorto bi-iliac atherosclerosis without aortic aneurysm. The portal vein is patent. No acute vascular finding. No abdominopelvic adenopathy. Reproductive: Normal sized prostate gland, see details below regarding adjacent soft tissue air. Other: There is edema and soft tissue gas involving the left perineum extending to the base of the penis. Small perineal fluid collection measures 3.4 x 2.5 cm, series 2, image 87. Tracking soft tissue air extends superiorly along the inferior margin of the prostate gland. No abdominopelvic ascites. Musculoskeletal: Multilevel degenerative change throughout the spine. Degenerative change of the hips. No bony destruction or acute osseous abnormality. IMPRESSION: 1. No bowel obstruction. 2. Midline perineal abscess measuring 3.4 x 2.5 cm just below the base of the penis anterior to the rectum, with patchy soft tissue air tracking in the left perineum, into the subcutaneous tissues. Findings highly suspicious for Fournier's gangrene. 3. Distended urinary bladder without bladder wall thickening. 4. Hepatic steatosis. Cholelithiasis without gallbladder inflammation. 5. Colonic diverticulosis without diverticulitis. Aortic Atherosclerosis (ICD10-I70.0). These results were called by telephone at the time of interpretation on 11/09/2019 at 9:17 pm to Dr Apolonio Schneiders LITTLE , who verbally acknowledged these results. Electronically Signed   By: Keith Rake M.D.   On: 11/09/2019 21:18    Anti-infectives: Anti-infectives (From admission, onward)   Start     Dose/Rate Route  Frequency Ordered Stop   11/10/19 1000  vancomycin (VANCOCIN) IVPB 1000 mg/200 mL premix        1,000 mg 200 mL/hr over 60 Minutes Intravenous Every 12 hours 11/09/19 2134     11/10/19 1000  fluconazole (DIFLUCAN) tablet 200 mg        200 mg Oral Daily 11/10/19 0132 11/13/19 0959   11/10/19 0900  metroNIDAZOLE (FLAGYL) IVPB 500 mg        500 mg 100 mL/hr over 60 Minutes Intravenous Every 8 hours 11/10/19 0731     11/10/19 0600  ceFEPIme (MAXIPIME) 2 g in sodium chloride 0.9 % 100 mL IVPB        2 g 200 mL/hr over 30 Minutes Intravenous Every 8 hours 11/09/19 2134     11/10/19 0600  clindamycin (CLEOCIN) IVPB 900 mg  Status:  Discontinued        900 mg 100 mL/hr over 30 Minutes Intravenous On call to O.R. 11/10/19 0113 11/10/19 0516   11/09/19 2130  ceFEPIme (MAXIPIME) 2 g in sodium chloride 0.9 % 100 mL IVPB        2 g 200 mL/hr over 30 Minutes Intravenous  Once 11/09/19 2128 11/09/19 2217   11/09/19 2130  vancomycin (VANCOCIN) IVPB 1000 mg/200 mL premix        1,000 mg 200 mL/hr over 60 Minutes Intravenous Every hour 11/09/19 2128 11/09/19  2333   11/09/19 2115  clindamycin (CLEOCIN) IVPB 900 mg        900 mg 100 mL/hr over 30 Minutes Intravenous  Once 11/09/19 2114 11/09/19 2147       Assessment/Plan OSA Morbid obesity HTN DM  -- per medicine --  POD 1, s/p I&D of suprasphincteric perirectal abscess -stop dressing changes, will leave wound open and do sitz bathes and have the patient get in the shower -cont abx therapy, may convert to oral abx therapy from surgery standpoint as wound is very clean.  If going to be here for another day or so could leave on IV abx therapy as WBC 15K -CX: rare gram negative rods, some gram + cocci -patient is otherwise surgically stable for DC home when felt medically stable for Dc -follow up with is being arranged with our office currently.    FEN - carb mod diet VTE - ok for chemical prophylaxis from our standpoint ID -  [email or web address]/vanc   LOS: 1 day    Henreitta Cea , [facility] Surgery 11/11/2019, 10:14 AM Please see Amion for pager number during day hours 7:00am-4:30pm or 7:00am -11:30am on weekends

## 2019-11-11 NOTE — Progress Notes (Signed)
Pt requested no dressing change tonight

## 2019-11-11 NOTE — Anesthesia Postprocedure Evaluation (Signed)
Anesthesia Post Note  Patient: Gary Butler  Procedure(s) Performed: INCISION AND DRAINAGE OF HIGH SCROTAL ABSCESS (N/A Scrotum) ANORECTAL EXAM UNDER ANESTHESIA (N/A Rectum)     Patient location during evaluation: PACU Anesthesia Type: General Level of consciousness: sedated and patient cooperative Pain management: pain level controlled Vital Signs Assessment: post-procedure vital signs reviewed and stable Respiratory status: spontaneous breathing Cardiovascular status: stable Anesthetic complications: no   No complications documented.  Last Vitals:  Vitals:   11/11/19 0143 11/11/19 0557  BP: (!) 95/55 (!) 98/58  Pulse: (!) 51 (!) 56  Resp: 17 15  Temp:    SpO2: 94% 96%    Last Pain:  Vitals:   11/10/19 1910  TempSrc:   PainSc: 2                  Nolon Nations

## 2019-11-11 NOTE — Progress Notes (Signed)
Pt refused to ambulate. Education provided. NT will attempt to walk with pt again in an hour.

## 2019-11-11 NOTE — Progress Notes (Signed)
Initial Nutrition Assessment  DOCUMENTATION CODES:   Morbid obesity  INTERVENTION:   [employer] 1.4 po BID, each supplement provides 455 kcal and 20 grams protein.   NUTRITION DIAGNOSIS:   Increased nutrient needs related to post-op healing as evidenced by estimated needs.  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  Patient also reports that he underwent EGD about a week ago underwent esophageal dilatation.  9/28: s/p INCISION & DRAINAGE OF HIGH PERIRECTAL ABSCESS  Pt with history of dysphagia, just recently had an esophageal dilatation. PTA pt was having nausea, vomiting, diarrhea and decreased appetite.  Patient currently consuming 80-100% of meals today. Would recommend protein supplements, has been advised to avoid dairy so will order [employer].  Per weight records, pt has lost 60 lbs since 02/19/19 (16% wt loss x 8.5 months, significant for time frame).  Medications: Vitamin D, Folic acid, KLOR-CON, Vitamin B-12 Labs reviewed: CBGs: 132-156 Low Na, K  NUTRITION - FOCUSED PHYSICAL EXAM:  No depletions noted.  Diet Order:   Diet Order            Diet Carb Modified Fluid consistency: Thin; Room service appropriate? Yes  Diet effective now                 EDUCATION NEEDS:   No education needs have been identified at this time  Skin:  Skin Assessment: Reviewed RN Assessment  Last BM:  9/22  Height:   Ht Readings from Last 1 Encounters:   11/10/19 5' 10.98" (1.803 m)    Weight:   Wt Readings from Last 1 Encounters:  11/10/19 (!) 137.4 kg    BMI:  Body mass index is 42.27 kg/m.  Estimated Nutritional Needs:   Kcal:  0109-3235  Protein:  110-125g  Fluid:  2.2L/day  Clayton Bibles, MS, RD, LDN Inpatient Clinical Dietitian Contact information available via Amion

## 2019-11-12 ENCOUNTER — Other Ambulatory Visit: Payer: Self-pay | Admitting: Medical

## 2019-11-12 ENCOUNTER — Inpatient Hospital Stay (HOSPITAL_COMMUNITY): Payer: Medicare PPO

## 2019-11-12 DIAGNOSIS — I5032 Chronic diastolic (congestive) heart failure: Secondary | ICD-10-CM

## 2019-11-12 DIAGNOSIS — I4891 Unspecified atrial fibrillation: Secondary | ICD-10-CM

## 2019-11-12 DIAGNOSIS — I10 Essential (primary) hypertension: Secondary | ICD-10-CM

## 2019-11-12 DIAGNOSIS — I480 Paroxysmal atrial fibrillation: Secondary | ICD-10-CM

## 2019-11-12 DIAGNOSIS — K611 Rectal abscess: Secondary | ICD-10-CM

## 2019-11-12 DIAGNOSIS — M79609 Pain in unspecified limb: Secondary | ICD-10-CM

## 2019-11-12 LAB — CBC WITH DIFFERENTIAL/PLATELET
Abs Immature Granulocytes: 0.1 10*3/uL — ABNORMAL HIGH (ref 0.00–0.07)
Basophils Absolute: 0 10*3/uL (ref 0.0–0.1)
Basophils Relative: 0 %
Eosinophils Absolute: 0.2 10*3/uL (ref 0.0–0.5)
Eosinophils Relative: 2 %
HCT: 43.7 % (ref 39.0–52.0)
Hemoglobin: 14.4 g/dL (ref 13.0–17.0)
Immature Granulocytes: 1 %
Lymphocytes Relative: 7 %
Lymphs Abs: 0.7 10*3/uL (ref 0.7–4.0)
MCH: 31.3 pg (ref 26.0–34.0)
MCHC: 33 g/dL (ref 30.0–36.0)
MCV: 95 fL (ref 80.0–100.0)
Monocytes Absolute: 0.4 10*3/uL (ref 0.1–1.0)
Monocytes Relative: 4 %
Neutro Abs: 8.8 10*3/uL — ABNORMAL HIGH (ref 1.7–7.7)
Neutrophils Relative %: 86 %
Platelets: 235 10*3/uL (ref 150–400)
RBC: 4.6 MIL/uL (ref 4.22–5.81)
RDW: 15.9 % — ABNORMAL HIGH (ref 11.5–15.5)
WBC: 10.3 10*3/uL (ref 4.0–10.5)
nRBC: 0 % (ref 0.0–0.2)

## 2019-11-12 LAB — BASIC METABOLIC PANEL
Anion gap: 10 (ref 5–15)
BUN: 18 mg/dL (ref 8–23)
CO2: 21 mmol/L — ABNORMAL LOW (ref 22–32)
Calcium: 8.8 mg/dL — ABNORMAL LOW (ref 8.9–10.3)
Chloride: 101 mmol/L (ref 98–111)
Creatinine, Ser: 1.09 mg/dL (ref 0.61–1.24)
GFR calc Af Amer: >60 mL/min (ref >60)
GFR calc non Af Amer: >60 mL/min (ref >60)
Glucose, Bld: 133 mg/dL — ABNORMAL HIGH (ref 70–99)
Potassium: 3.8 mmol/L (ref 3.5–5.1)
Sodium: 132 mmol/L — ABNORMAL LOW (ref 135–145)

## 2019-11-12 LAB — MAGNESIUM: Magnesium: 1.9 mg/dL (ref 1.7–2.4)

## 2019-11-12 LAB — GLUCOSE, CAPILLARY
Glucose-Capillary: 134 mg/dL — ABNORMAL HIGH (ref 70–99)
Glucose-Capillary: 134 mg/dL — ABNORMAL HIGH (ref 70–99)
Glucose-Capillary: 150 mg/dL — ABNORMAL HIGH (ref 70–99)
Glucose-Capillary: 154 mg/dL — ABNORMAL HIGH (ref 70–99)

## 2019-11-12 MED ORDER — TAMSULOSIN HCL 0.4 MG PO CAPS
0.4000 mg | ORAL_CAPSULE | Freq: Every day | ORAL | Status: DC
  Administered 2019-11-12 – 2019-11-16 (×5): 0.4 mg via ORAL
  Filled 2019-11-12 (×5): qty 1

## 2019-11-12 MED ORDER — SODIUM CHLORIDE 0.9 % IV SOLN
2.0000 g | Freq: Every day | INTRAVENOUS | Status: DC
  Administered 2019-11-12: 2 g via INTRAVENOUS
  Filled 2019-11-12: qty 2
  Filled 2019-11-12: qty 20

## 2019-11-12 MED ORDER — APIXABAN 5 MG PO TABS
5.0000 mg | ORAL_TABLET | Freq: Two times a day (BID) | ORAL | Status: DC
  Administered 2019-11-12 – 2019-11-17 (×10): 5 mg via ORAL
  Filled 2019-11-12 (×11): qty 1

## 2019-11-12 MED ORDER — SODIUM CHLORIDE 0.9 % IV BOLUS
500.0000 mL | Freq: Once | INTRAVENOUS | Status: AC
  Administered 2019-11-12: 500 mL via INTRAVENOUS

## 2019-11-12 MED ORDER — SODIUM CHLORIDE 0.9 % IV SOLN: INTRAVENOUS | Status: DC

## 2019-11-12 NOTE — Progress Notes (Signed)
2 Days Post-Op  Subjective: No new complaints except still pain in his buttock region.  No showering or sitz bathes were done yesterday despite orders, patient did refuse his therapies yesterday per chart.  ROS: See above, otherwise other systems negative  Objective: Vital signs in last 24 hours: Temp:  [97.5 F (36.4 C)-98.9 F (37.2 C)] 97.5 F (36.4 C) (09/30 0609) Pulse Rate:  [55-58] 57 (09/30 0609) Resp:  [18-20] 18 (09/30 0609) BP: (92-100)/(50-72) 96/72 (09/30 0609) SpO2:  [94 %-97 %] 94 % (09/30 0609) Last BM Date: 11/04/19  Intake/Output from previous day: 09/29 0701 - 09/30 0700 In: 1846.7 [P.O.:1260; I.V.:80; IV Piggyback:506.7] Out: 2950 [Urine:2950] Intake/Output this shift: No intake/output data recorded.  PE: Gu: wound with increasing purulent drainage today.  Some increase in fibrin at edges of wound.  Some packing replaced while I was at the bedside to help wick this drainage out.  No induration or erythema otherwise  Lab Results:  Recent Labs    11/11/19 0647 11/12/19 0605  WBC 15.1* 10.3  HGB 13.7 14.4  HCT 40.9 43.7  PLT 211 235   BMET Recent Labs    11/11/19 0647 11/12/19 0605  NA 133* 132*  K 3.4* 3.8  CL 101 101  CO2 21* 21*  GLUCOSE 165* 133*  BUN 19 18  CREATININE 1.14 1.09  CALCIUM 8.4* 8.8*   PT/INR No results for input(s): LABPROT, INR in the last 72 hours. CMP     Component Value Date/Time   NA 132 (L) 11/12/2019 0605   NA 139 06/17/2013 0946   K 3.8 11/12/2019 0605   K 3.5 06/17/2013 0946   CL 101 11/12/2019 0605   CL 102 05/14/2012 0856   CO2 21 (L) 11/12/2019 0605   CO2 27 06/17/2013 0946   GLUCOSE 133 (H) 11/12/2019 0605   GLUCOSE 184 (H) 06/17/2013 0946   GLUCOSE 135 (H) 05/14/2012 0856   BUN 18 11/12/2019 0605   BUN 13.8 06/17/2013 0946   CREATININE 1.09 11/12/2019 0605   CREATININE 0.95 07/14/2015 1019   CREATININE 1.1 06/17/2013 0946   CALCIUM 8.8 (L) 11/12/2019 0605   CALCIUM 9.3 06/17/2013 0946    PROT 5.4 (L) 11/11/2019 0647   PROT 5.7 (L) 06/17/2013 0946   ALBUMIN 2.6 (L) 11/11/2019 0647   ALBUMIN 3.2 (L) 06/17/2013 0946   AST 16 11/11/2019 0647   AST 19 06/17/2013 0946   ALT 12 11/11/2019 0647   ALT 18 06/17/2013 0946   ALKPHOS 70 11/11/2019 0647   ALKPHOS 101 06/17/2013 0946   BILITOT 1.2 11/11/2019 0647   BILITOT 1.54 (H) 06/17/2013 0946   GFRNONAA >60 11/12/2019 0605   GFRAA >60 11/12/2019 0605   Lipase     Component Value Date/Time   LIPASE 22 11/09/2019 1833       Studies/Results: ECHOCARDIOGRAM COMPLETE  Result Date: 11/11/2019    ECHOCARDIOGRAM REPORT   Patient Name:   JAVION HOLMER  Date of Exam: 11/11/2019 Medical Rec #:  [id number]  Height:       71.0 in Accession #:    [id number] Weight:       302.9 lb Date of Birth:  29-Jan-1946  BSA:          2.515 m Patient Age:    74 years   BP:           98/58 mmHg Patient Gender: M          HR:  56 bpm. Exam Location:  Inpatient Procedure: 2D Echo Indications:    427.31 atrial fibrillation  History:        Patient has prior history of Echocardiogram examinations, most                 recent 10/11/2017. Risk Factors:Hypertension and Diabetes.  Sonographer:    Jannett Celestine RDCS (AE) Referring Phys: [id number] Sherryll Burger [facility]  Sonographer Comments: Technically difficult study due to poor echo windows, no parasternal window and patient is morbidly obese. Image acquisition challenging due to patient body habitus, Image acquisition challenging due to respiratory motion and restricted mobility. IMPRESSIONS  1. Left ventricular ejection fraction, by estimation, is 60 to 65%. The left ventricle has normal function. The left ventricle has no regional wall motion abnormalities. Left ventricular diastolic parameters are consistent with Grade II diastolic dysfunction (pseudonormalization).  2. Right ventricular systolic function is normal. The right ventricular size is normal.  3. The mitral valve is normal in structure. No evidence of  mitral valve regurgitation. No evidence of mitral stenosis.  4. The aortic valve is normal in structure. Aortic valve regurgitation is not visualized. No aortic stenosis is present.  5. The inferior vena cava is normal in size with greater than 50% respiratory variability, suggesting right atrial pressure of 3 mmHg. FINDINGS  Left Ventricle: Left ventricular ejection fraction, by estimation, is 60 to 65%. The left ventricle has normal function. The left ventricle has no regional wall motion abnormalities. Definity contrast agent was given IV to delineate the left ventricular  endocardial borders. The left ventricular internal cavity size was normal in size. There is no left ventricular hypertrophy. Left ventricular diastolic parameters are consistent with Grade II diastolic dysfunction (pseudonormalization). Right Ventricle: The right ventricular size is normal. No increase in right ventricular wall thickness. Right ventricular systolic function is normal. Left Atrium: Left atrial size was not well visualized. Right Atrium: Right atrial size was normal in size. Pericardium: There is no evidence of pericardial effusion. Mitral Valve: The mitral valve is normal in structure. No evidence of mitral valve regurgitation. No evidence of mitral valve stenosis. Tricuspid Valve: The tricuspid valve is not well visualized. Tricuspid valve regurgitation is trivial. No evidence of tricuspid stenosis. Aortic Valve: The aortic valve is normal in structure. Aortic valve regurgitation is not visualized. No aortic stenosis is present. Pulmonic Valve: The pulmonic valve was normal in structure. Pulmonic valve regurgitation is not visualized. No evidence of pulmonic stenosis. Aorta: The aortic root is normal in size and structure. Venous: The inferior vena cava is normal in size with greater than 50% respiratory variability, suggesting right atrial pressure of 3 mmHg. IAS/Shunts: No atrial level shunt detected by color flow Doppler.    Diastology LV e' medial:    5.44 cm/s LV E/e' medial:  17.2 LV e' lateral:   6.53 cm/s LV E/e' lateral: 14.4  AORTIC VALVE LVOT Vmax:   58.10 cm/s LVOT Vmean:  42.500 cm/s LVOT VTI:    0.123 m MITRAL VALVE MV Area (PHT): 2.26 cm    SHUNTS MV Decel Time: 335 msec    Systemic VTI: 0.12 m MV E velocity: 93.80 cm/s MV A velocity: 38.60 cm/s MV E/A ratio:  2.43 Candee Furbish MD Electronically signed by Candee Furbish MD Signature Date/Time: 11/11/2019/3:31:15 PM    Final     Anti-infectives: Anti-infectives (From admission, onward)   Start     Dose/Rate Route Frequency Ordered Stop   11/10/19 1000  vancomycin (VANCOCIN) IVPB 1000 mg/200  mL premix        1,000 mg 200 mL/hr over 60 Minutes Intravenous Every 12 hours 11/09/19 2134     11/10/19 1000  fluconazole (DIFLUCAN) tablet 200 mg        200 mg Oral Daily 11/10/19 0132 11/12/19 0850   11/10/19 0900  metroNIDAZOLE (FLAGYL) IVPB 500 mg        500 mg 100 mL/hr over 60 Minutes Intravenous Every 8 hours 11/10/19 0731     11/10/19 0600  ceFEPIme (MAXIPIME) 2 g in sodium chloride 0.9 % 100 mL IVPB        2 g 200 mL/hr over 30 Minutes Intravenous Every 8 hours 11/09/19 2134     11/10/19 0600  clindamycin (CLEOCIN) IVPB 900 mg  Status:  Discontinued        900 mg 100 mL/hr over 30 Minutes Intravenous On call to O.R. 11/10/19 0113 11/10/19 0516   11/09/19 2130  ceFEPIme (MAXIPIME) 2 g in sodium chloride 0.9 % 100 mL IVPB        2 g 200 mL/hr over 30 Minutes Intravenous  Once 11/09/19 2128 11/09/19 2217   11/09/19 2130  vancomycin (VANCOCIN) IVPB 1000 mg/200 mL premix        1,000 mg 200 mL/hr over 60 Minutes Intravenous Every hour 11/09/19 2128 11/09/19 2333   11/09/19 2115  clindamycin (CLEOCIN) IVPB 900 mg        900 mg 100 mL/hr over 30 Minutes Intravenous  Once 11/09/19 2114 11/09/19 2147       Assessment/Plan OSA Morbid obesity HTN DM  UTI - e.coli -- per medicine --  POD 2, s/p I&D of suprasphincteric perirectal abscess -WBC normalized  today -increase in purulent drainage today, but did not get shower or sitz bathes yesterday.  Discussed importance of this with patient today and he is looking forward to this.  This should clean his wound up overall. -abx can be switched to oral from IV from my standpoint -CX: rare gram negative rods, some gram + cocci -patient is otherwise surgically stable for DC home when felt medically stable for Dc -follow up with is being arranged with our office currently.   FEN -carb mod diet VTE -Lovenox ID -maxipime/vanc   LOS: 2 days    Henreitta Cea , [facility] Surgery 11/12/2019, 9:42 AM Please see Amion for pager number during day hours 7:00am-4:30pm or 7:00am -11:30am on weekends

## 2019-11-12 NOTE — Evaluation (Signed)
Physical Therapy Evaluation Patient Details Name: Gary Butler MRN: [id number] DOB: [birth year] Today's Date: 11/12/2019   History of Present Illness  Pt s/p I&D of scrotal abscess and with hx of DM, Psoriatic Arthritis, Bil TKR, early dementia and chronic back pain  Clinical Impression  Pt admitted as above and presenting with functional mobility limitations 2* ongoing balance deficits, chronic LBP, obesity, and limited endurance.  Pt should progress to dc home with family assist and could benefit from follow up HHPT to further address deficits - dependent on acute stay progress.    Follow Up Recommendations Home health PT    Equipment Recommendations  None recommended by PT    Recommendations for Other Services       Precautions / Restrictions Precautions Precautions: Fall Restrictions Weight Bearing Restrictions: No      Mobility  Bed Mobility Overal bed mobility: Needs Assistance Bed Mobility: Supine to Sit     Supine to sit: Min assist;Mod assist     General bed mobility comments: use of bed rails, cues for sequence, physical assist to bring trunk to upright  Transfers Overall transfer level: Needs assistance Equipment used: Rolling walker (2 wheeled) Transfers: Sit to/from Stand Sit to Stand: Min assist         General transfer comment: cues for use of UEs to self assist  Ambulation/Gait Ambulation/Gait assistance: Min assist Gait Distance (Feet): 450 Feet Assistive device: Rolling walker (2 wheeled) Gait Pattern/deviations: Step-through pattern;Decreased step length - right;Decreased step length - left;Shuffle;Trunk flexed     General Gait Details: cues for posture and position from RW; multiple short standing rest breaks 2* fatigue  Stairs            Wheelchair Mobility    Modified Rankin (Stroke Patients Only)       Balance Overall balance assessment: Needs assistance Sitting-balance support: No upper extremity supported;Feet  supported Sitting balance-Leahy Scale: Good     Standing balance support: No upper extremity supported Standing balance-Leahy Scale: Fair                               Pertinent Vitals/Pain Pain Assessment: 0-10 Pain Score: 7  Pain Location: low back Pain Descriptors / Indicators: Aching;Discomfort Pain Intervention(s): Limited activity within patient's tolerance;Monitored during session;Premedicated before session    Home Living Family/patient expects to be discharged to:: Private residence Living Arrangements: Spouse/significant other Available Help at Discharge: Family Type of Home: House Home Access: Ramped entrance     Home Layout: One level Home Equipment: Environmental consultant - 2 wheels;Cane - single point      Prior Function Level of Independence: Independent with assistive device(s)         Comments: uses cane or RW - admits to having fallen when not using RW     Hand Dominance        Extremity/Trunk Assessment   Upper Extremity Assessment Upper Extremity Assessment: Overall WFL for tasks assessed    Lower Extremity Assessment Lower Extremity Assessment: Overall WFL for tasks assessed       Communication   Communication: No difficulties  Cognition Arousal/Alertness: Awake/alert Behavior During Therapy: WFL for tasks assessed/performed Overall Cognitive Status: Within Functional Limits for tasks assessed                                        General Comments  Exercises     Assessment/Plan    PT Assessment Patient needs continued PT services  PT Problem List Decreased activity tolerance;Decreased balance;Decreased mobility;Decreased knowledge of use of DME;Pain;Obesity       PT Treatment Interventions DME instruction;Gait training;Functional mobility training;Therapeutic activities;Therapeutic exercise;Balance training;Patient/family education    PT Goals (Current goals can be found in the Care Plan section)  Acute  Rehab PT Goals Patient Stated Goal: Regain IND PT Goal Formulation: With patient Time For Goal Achievement: 11/26/19 Potential to Achieve Goals: Good    Frequency Min 3X/week   Barriers to discharge        Co-evaluation               AM-PAC PT "6 Clicks" Mobility  Outcome Measure Help needed turning from your back to your side while in a flat bed without using bedrails?: A Little Help needed moving from lying on your back to sitting on the side of a flat bed without using bedrails?: A Little Help needed moving to and from a bed to a chair (including a wheelchair)?: A Little Help needed standing up from a chair using your arms (e.g., wheelchair or bedside chair)?: A Little Help needed to walk in hospital room?: A Little Help needed climbing 3-5 steps with a railing? : A Little 6 Click Score: 18    End of Session Equipment Utilized During Treatment: Gait belt Activity Tolerance: Patient tolerated treatment well Patient left: in chair;with call bell/phone within reach;with chair alarm set;with family/visitor present Nurse Communication: Mobility status PT Visit Diagnosis: Unsteadiness on feet (R26.81);Difficulty in walking, not elsewhere classified (R26.2)    Time: 1205-1230 PT Time Calculation (min) (ACUTE ONLY): 25 min   Charges:   PT Evaluation $PT Eval Low Complexity: 1 Low PT Treatments $Gait Training: 8-22 mins        [place] Pager [phone] Office [phone]   [name] 11/12/2019, 1:24 PM

## 2019-11-12 NOTE — Progress Notes (Signed)
[facility] for Apixaban Indication: atrial fibrillation  Allergies  Allergen Reactions  . Penicillins Hives    Patient reports full body hives that required medical treatment when he was in his 54s or 73s. He tolerated cephalosporins.   [name] [Pregabalin] Swelling  . Levofloxacin Swelling   Patient Measurements: Height: 5' 10.98" (180.3 cm) Weight: (!) 137.4 kg (302 lb 14.6 oz) IBW/kg (Calculated) : 75.26  Vital Signs: Temp: 97.6 F (36.4 C) (09/30 1510) Temp Source: Oral (09/30 1510) BP: 90/52 (09/30 1510) Pulse Rate: 62 (09/30 1510)  Labs: Recent Labs    11/09/19 1833 11/09/19 1833 11/10/19 0958 11/10/19 0958 11/11/19 0647 11/12/19 0605  HGB 16.3   < > 14.3   < > 13.7 14.4  HCT 46.7   < > 42.3  --  40.9 43.7  PLT 277   < > 223  --  211 235  CREATININE 1.50*   < > 1.22  --  1.14 1.09  TROPONINIHS 14  --  15  --   --   --    < > = values in this interval not displayed.   Estimated Creatinine Clearance: 84.2 mL/min (by C-G formula based on SCr of 1.09 mg/dL).  Medical History: Past Medical History:  Diagnosis Date  . Arthritis   . Constipation   . Diabetes mellitus   . Heart murmur   . History of gout   . History of skin cancer   . Hypertension   . Morbid (severe) obesity due to excess calories ([place])   . OSA (obstructive sleep apnea)   . Psoriatic arthritis ([place])   . Rectal fissure   . Sleep apnea   . Tinnitus    Medications:  Scheduled:  . acetaminophen  1,000 mg Oral TID  . allopurinol  300 mg Oral Daily  . apixaban  5 mg Oral BID  . aspirin EC  81 mg Oral Daily  . bisoprolol  5 mg Oral Daily  . Chlorhexidine Gluconate Cloth  6 each Topical Once   And  . Chlorhexidine Gluconate Cloth  6 each Topical Once  . cholecalciferol  2,000 Units Oral Daily  . donepezil  10 mg Oral QHS  . feeding supplement ([employer] STANDARD 1.4)  325 mL Oral BID BM  . fluticasone  2 spray Each Nare Daily  . folic acid  1 mg  Oral Daily  . insulin aspart  0-20 Units Subcutaneous TID AC & HS  . lip balm  1 application Topical BID  . loratadine  10 mg Oral Daily  . nystatin cream   Topical BID  . pantoprazole  40 mg Oral Daily  . psyllium  1 packet Oral BID  . sertraline  50 mg Oral Daily  . sodium chloride flush  3 mL Intravenous Q12H  . tamsulosin  0.4 mg Oral QPC supper  . vitamin B-12  2,000 mcg Oral Daily   Assessment: 73 yoM admit 9/27 with diarrhea, perineal abscess. Noted in Afib on admit, Cards consult: recommends begin Apixaban for Afib.   Goal of Therapy:  Monitor platelets by anticoagulation protocol: Yes   Plan:   Discontinued daily Lovenox  Begin Apixaban 5mg  bid  Monitor s/s bleed  Educate in am  Minda Ditto PharmD 11/12/2019, 5:47 PM

## 2019-11-12 NOTE — Consult Note (Signed)
Cardiology Consultation:   Patient ID: Gary Butler MRN: [id number]; DOB: [birth year]  Admit date: 11/09/2019 Date of Consult: 11/12/2019  Primary Care Provider: [name], MD [facility] Cardiologist: Shelva Majestic, MD  [place] Electrophysiologist:  None    Patient Profile:   Gary Butler is a 74 y.o. male with a hx of OSA on Bipap, morbid obesity, heart murmur, DM2, GERD,  HTN who is being seen today for the evaluation of New onset Afib at the request of Gary. [place] Crane.  History of Present Illness:   Gary Butler is followed by Gary. Claiborne Billings for the above cardiac issues.  Patient has no prior CAD history.  Patient had a Myoview in 2012 was normal.  Echo in May 2016 showed EF 50 to 55%, thickened aortic valve with mild AS, mild left atrial dilation.  Was seen by Gary. Claiborne Billings 06/2015 for lower extremity edema.  Maxide was discontinued and Lasix was increased to 40 mg daily.  Echo was obtained which showed EF 60 to 30%, grade 2 diastolic dysfunction, thickened aortic valve with mild AS.  Neurology follows obstructive sleep apnea.  Gary. Melvyn Novas did a repeat echo on 10/11/2017 which showed EF 60 to 65%, suboptimal image size due to the body size even with Definity contrast, moderate LVH, no wall motion abnormality, grade 2 diastolic dysfunction, mild AS, mildly dilated ascending aorta measuring 40 mm. 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  Patient's most recent sleep study was in October 2019 he was placed on BiPAP.  Patient was last seen 12/12/2017 by Gary Deforest, Gary Butler.  Reported progressive dyspnea on exertion and fluid retention.  He had loss 50 pounds in the last 4 months.  CMP appeared to be euvolemic diuretic was not changed. No chest pain.  A Myoview stress test was ordered which showed EF 55 to 65%; tt was low risk and normal.  Patient presented to [facility] ED 11/09/2019 complaints of frequent bouts of watery diarrhea and rectal pain.  Reported 6 weeks of explosive  diarrhea and progressively worsening rectal discomfort. Also was vomiting and had poot po intake. He lost weight over this time. Denied fever or chills. Had intermittnet chest discomfort related to GI issues. No history of exertional chest pain. Also was experiencing some sob. Patient was very dehydrated. His rectal pain was waxing and waning with bowel movements. Patient reported he underwent EGD 1 week ago and underwent esophageal dilation which seemed to improve his breathing.  Patient called his primary care who recommended ER evaluation.  In the ER CT of the abdomen pelvis showed abscess formation just below the base of the penis.  EKG showed new onset afib. There was an initial concern for Fournier's gangrene and therefore discussed with urology and general surgery and patient was transferred to [facility] long for further work-up. Per further discussion it was felt patient was suffering from perineal abscess without Fournier's. The patient was taken to the operating room for I&D 9/28 and subsequently admitted.  EKG 11/09/2019 revealed A. fib with RVR, 108 bpm  EKG 9/29 SB, 58 bpm  Recent labs: Sodium 132, potassium 3.8, chloride 101, CO2 21 Glucose 133 Creatinine 1.09, BUN 18 Calcium 8.8 Magnesium 1.9 WBC 10.3, hemoglobin 14.4 HS 14>15 TSH 1.41   Past Medical History:  Diagnosis Date  . Arthritis   . Constipation   . Diabetes mellitus   . Heart murmur   . History of gout   . History of skin cancer   .  Hypertension   . Morbid (severe) obesity due to excess calories ([place])   . OSA (obstructive sleep apnea)   . Psoriatic arthritis ([place])   . Rectal fissure   . Sleep apnea   . Tinnitus     Past Surgical History:  Procedure Laterality Date  . addenoids    . ANAL FISTULOTOMY  2019   Gary Butler, Phoebe Perch  . IRRIGATION AND DEBRIDEMENT ABSCESS N/A 11/10/2019   Procedure: INCISION AND DRAINAGE OF HIGH SCROTAL ABSCESS;  Surgeon: Gary Boston, MD;  Location: [facility] ORS;  Service: General;   Laterality: N/A;  . JOINT REPLACEMENT  2012   RT KNEE  . LUNG BIOPSY     20 YRS AGO  . RECTAL EXAM UNDER ANESTHESIA N/A 11/10/2019   Procedure: ANORECTAL EXAM UNDER ANESTHESIA;  Surgeon: Gary Boston, MD;  Location: [facility]S;  Service: General;  Laterality: N/A;  . TONSILLECTOMY    . TOTAL KNEE ARTHROPLASTY Bilateral 06/22/2013   Procedure: LEFT TOTAL KNEE ARTHROPLASTY;  MEDIAL SOFT TISSUE EXPLORATION SAPHENOUS NEURECTOMY RIGHT KNEE;  Surgeon: Mauri Pole, MD;  Location: [facility] ORS;  Service: Orthopedics;  Laterality: Bilateral;     Home Medications:  Prior to Admission medications   Medication Sig Start Date End Date Taking? Authorizing Provider  allopurinol (ZYLOPRIM) 300 MG tablet TAKE 1 TABLET(300 MG) BY MOUTH DAILY Patient taking differently: Take 300 mg by mouth daily.  01/13/18  Yes [name], MD  atorvastatin (LIPITOR) 40 MG tablet TAKE 1 TABLET(40 MG) BY MOUTH EVERY EVENING Patient taking differently: Take 40 mg by mouth daily.  07/06/19  Yes [name], MD  bisoprolol (ZEBETA) 5 MG tablet TAKE 1 TABLET(5 MG) BY MOUTH DAILY Patient taking differently: Take 5 mg by mouth daily.  11/07/19  Yes Tanda Rockers, MD  cetirizine (ZYRTEC) 10 MG tablet TAKE 1 TABLET(10 MG) BY MOUTH DAILY Patient taking differently: Take 10 mg by mouth daily.  02/09/19  Yes [name], MD  Cholecalciferol (VITAMIN D) 2000 UNITS tablet Take 2,000 Units by mouth daily.   Yes [provider]  Cyanocobalamin (B-12) 2000 MCG TABS Take 1 tablet by mouth daily.    Yes [provider]  donepezil (ARICEPT) 10 MG tablet Take 1 tablet (10 mg total) by mouth at bedtime. 08/10/19  Yes [name], MD  empagliflozin (JARDIANCE) 10 MG TABS tablet Take 10 mg by mouth daily before breakfast. 04/22/19  Yes [name], MD  fluticasone [facility]) 50 MCG/ACT nasal spray Place 2 sprays into both nostrils daily. Patient taking differently: Place 2 sprays into both nostrils  daily as needed for allergies or rhinitis.  01/17/17  Yes [name], MD  folic acid (FOLVITE) 1 MG tablet Take 1 mg by mouth daily.  04/29/12  Yes [provider]  furosemide (LASIX) 20 MG tablet TAKE 1 TABLET(20 MG) BY MOUTH DAILY Patient taking differently: Take 20 mg by mouth daily.  07/06/19  Yes [name], MD  meloxicam (MOBIC) 15 MG tablet Take 1 tablet by mouth daily. 07/06/19  Yes [name], MD  nystatin ointment (MYCOSTATIN) Apply 1 application topically 2 (two) times daily. Patient taking differently: Apply 1 application topically 2 (two) times daily as needed.  06/27/15  Yes [name], MD  omeprazole (PRILOSEC) 40 MG capsule TAKE 1 CAPSULE BY MOUTH 30 TO 60 MINUTES BEFORE YOUR FIRST AND LAST MEAL OF THE DAY Patient taking differently: Take 40 mg by mouth daily. 30 TO 60 MINUTES BEFORE  YOUR FIRST AND LAST MEAL OF THE DAY 09/22/19  Yes Tanda Rockers, MD  potassium chloride (MICRO-K) 10 MEQ CR capsule TAKE 1 CAPSULE BY MOUTH EVERY DAY Patient taking differently: Take 10 mEq by mouth daily.  07/06/19  Yes [name], MD  sertraline (ZOLOFT) 50 MG tablet TAKE 1 TABLET(50 MG) BY MOUTH DAILY. Patient taking differently: Take 50 mg by mouth daily.  04/17/19  Yes [name], MD  telmisartan (MICARDIS) 40 MG tablet TAKE 1 TABLET(40 MG) BY MOUTH DAILY Patient taking differently: Take 40 mg by mouth daily.  03/19/19  Yes Tanda Rockers, MD  tiZANidine (ZANAFLEX) 4 MG tablet Take 1 tablet (4 mg total) by mouth every 6 (six) hours as needed for muscle spasms. 07/26/16  Yes [name], MD  triamterene-hydrochlorothiazide (MAXZIDE) 75-50 MG tablet Take 0.5 tablets by mouth every morning. Please call the office to schedule an appointment 07/06/19  Yes [name], MD  Albuterol Sulfate (PROAIR RESPICLICK) 654 (90 Base) MCG/ACT AEPB Inhale 2 puffs into the lungs every 6 (six) hours as needed (for wheezing or shortness of breath).  04/07/18   Brunetta Jeans, Gary Butler  aspirin 81 MG tablet Take 81 mg by mouth daily.    [provider]  etodolac (LODINE) 400 MG tablet Take by mouth.    [provider]  fluocinonide cream (LIDEX) 0.05 %  10/06/19   [provider]  MYRBETRIQ 25 MG TB24 tablet  02/11/15   [provider]  ondansetron (ZOFRAN) 8 MG tablet Take 8 mg by mouth 3 (three) times daily. 11/05/19   [provider]  UNABLE TO FIND Med Name: BiPap with [facility]    [provider]    Inpatient Medications: Scheduled Meds: . acetaminophen  1,000 mg Oral TID  . allopurinol  300 mg Oral Daily  . aspirin EC  81 mg Oral Daily  . bisoprolol  5 mg Oral Daily  . Chlorhexidine Gluconate Cloth  6 each Topical Once   And  . Chlorhexidine Gluconate Cloth  6 each Topical Once  . cholecalciferol  2,000 Units Oral Daily  . donepezil  10 mg Oral QHS  . enoxaparin (LOVENOX) injection  70 mg Subcutaneous Q24H  . feeding supplement (KATE FARMS STANDARD 1.4)  325 mL Oral BID BM  . fluticasone  2 spray Each Nare Daily  . folic acid  1 mg Oral Daily  . insulin aspart  0-20 Units Subcutaneous TID AC & HS  . lip balm  1 application Topical BID  . loratadine  10 mg Oral Daily  . nystatin cream   Topical BID  . pantoprazole  40 mg Oral Daily  . psyllium  1 packet Oral BID  . sertraline  50 mg Oral Daily  . sodium chloride flush  3 mL Intravenous Q12H  . vitamin B-12  2,000 mcg Oral Daily   Continuous Infusions: . sodium chloride 250 mL (11/11/19 1814)  . ceFEPime (MAXIPIME) IV 2 g (11/12/19 0444)  . methocarbamol (ROBAXIN) IV    . metronidazole 500 mg (11/12/19 0153)  . vancomycin 1,000 mg (11/11/19 2310)   PRN Meds: sodium chloride, acetaminophen, albuterol, diphenhydrAMINE, enalaprilat, fentaNYL (SUBLIMAZE) injection, loperamide, magic mouthwash, methocarbamol (ROBAXIN) IV, metoprolol tartrate, ondansetron **OR** ondansetron (ZOFRAN) IV, oxyCODONE, polyethylene glycol, sodium  chloride flush, tiZANidine  Allergies:    Allergies  Allergen Reactions  . Penicillins Hives    Patient reports full body hives that required medical treatment when he was in his  30s or 40s. He tolerated cephalosporins.   [name] [Pregabalin] Swelling  . Levofloxacin Swelling    Social History:   Social History   Socioeconomic History  . Marital status: Married    Spouse name: Not on file  . Number of children: 2  . Years of education: Not on file  . Highest education level: Not on file  Occupational History  . 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 Smoking status: Never Smoker  . Smokeless tobacco: Never Used  Vaping Use  . Vaping Use: Never used  Substance and Sexual Activity  . Alcohol use: Yes    Alcohol/week: 0.0 standard drinks    Comment: Once a month  . Drug use: No  . Sexual activity: Never  Other Topics Concern  . Not on file  Social History Narrative   Alcohol- 1 to 2 per month.    Social Determinants of Health   Financial Resource Strain:   . Difficulty of Paying Living Expenses: Not on file  Food Insecurity:   . Worried About Charity fundraiser in the Last Year: Not on file  . Ran Out of Food in the Last Year: Not on file  Transportation Needs:   . Lack of Transportation (Medical): Not on file  . Lack of Transportation (Non-Medical): Not on file  Physical Activity:   . Days of Exercise per Week: Not on file  . Minutes of Exercise per Session: Not on file  Stress:   . Feeling of Stress : Not on file  Social Connections:   . Frequency of Communication with Friends and Family: Not on file  . Frequency of Social Gatherings with Friends and Family: Not on file  . Attends Religious Services: Not on file  . Active Member of Clubs or Organizations: Not on file  . Attends Archivist Meetings: Not on file  . Marital Status: Not on file  Intimate Partner Violence:   . Fear of  Current or Ex-Partner: Not on file  . Emotionally Abused: Not on file  . Physically Abused: Not on file  . Sexually Abused: Not on file    Family History:   Family History  Problem Relation Age of Onset  . Emphysema Father   . Clotting disorder Father   . Arthritis Father   . Heart disease Mother   . Arthritis Mother   . Arthritis Maternal Grandmother   . Hypertension Maternal Grandmother   . Diabetes Maternal Grandmother   . Arthritis Paternal Grandmother   . Diabetes Maternal Aunt   . Diabetes Paternal Aunt      ROS:  Please see the history of present illness.  All other ROS reviewed and negative.     Physical Exam/Data:   Vitals:   11/11/19 1642 11/11/19 1922 11/11/19 2136 11/12/19 0609  BP: (!) 100/52  (!) 92/50 96/72  Pulse: (!) 58  (!) 55 (!) 57  Resp: 19  20 18   Temp: 98.9 F (37.2 C)  98.2 F (36.8 C) (!) 97.5 F (36.4 C)  TempSrc:    Oral  SpO2: 96% 97% 94% 94%  Weight:      Height:        Intake/Output Summary (Last 24 hours) at 11/12/2019 1017 Last data filed at 11/12/2019 0600 Gross per 24 hour  Intake 1366.67 ml  Output 2950 ml  Net -1583.33 ml   Last 3 Weights 11/10/2019 10/29/2019 10/13/2019  Weight (lbs) 302 lb 14.6 oz 303 lb 303 lb  Weight (kg) 137.4 kg 137.44 kg 137.44 kg     Body mass index is 42.27 kg/m.  General:  Well nourished, well developed, in no acute distress HEENT: normal Lymph: no adenopathy Neck: no JVD Endocrine:  No thryomegaly Vascular: No carotid bruits; FA pulses 2+ bilaterally without bruits  Cardiac:  normal S1, S2; RRR; no murmur  Lungs:  clear to auscultation bilaterally, no wheezing, rhonchi or rales  Abd: soft, nontender, no hepatomegaly  Ext: no edema Musculoskeletal:  No deformities, BUE and BLE strength normal and equal Skin: warm and dry  Neuro:  CNs 2-12 intact, no focal abnormalities noted Psych:  Normal affect   EKG:  The EKG was personally reviewed and demonstrates:  11/09/19 Afib RVr, 108 bpm, LAD,  nonspecific t wave abnormality Telemetry:  Telemetry was personally reviewed and demonstrates:  N/A  Relevant CV Studies:  Echo 11/11/19 1. Left ventricular ejection fraction, by estimation, is 60 to 65%. The  left ventricle has normal function. The left ventricle has no regional  wall motion abnormalities. Left ventricular diastolic parameters are  consistent with Grade II diastolic  dysfunction (pseudonormalization).  2. Right ventricular systolic function is normal. The right ventricular  size is normal.  3. The mitral valve is normal in structure. No evidence of mitral valve  regurgitation. No evidence of mitral stenosis.  4. The aortic valve is normal in structure. Aortic valve regurgitation is  not visualized. No aortic stenosis is present.  5. The inferior vena cava is normal in size with greater than 50%  respiratory variability, suggesting right atrial pressure of 3 mmHg.   Laboratory Data:  High Sensitivity Troponin:   Recent Labs  Lab 11/09/19 1833 11/10/19 0958  TROPONINIHS 14 15     Chemistry Recent Labs  Lab 11/10/19 0958 11/11/19 0647 11/12/19 0605  NA 130* 133* 132*  K 3.3* 3.4* 3.8  CL 96* 101 101  CO2 23 21* 21*  GLUCOSE 231* 165* 133*  BUN 18 19 18   CREATININE 1.22 1.14 1.09  CALCIUM 8.7* 8.4* 8.8*  GFRNONAA 58* >60 >60  GFRAA >60 >60 >60  ANIONGAP 11 11 10     Recent Labs  Lab 11/09/19 1833 11/10/19 0958 11/11/19 0647  PROT 6.8 5.8* 5.4*  ALBUMIN 3.3* 2.7* 2.6*  AST 25 16 16   ALT 15 14 12   ALKPHOS 92 74 70  BILITOT 1.4* 1.4* 1.2   Hematology Recent Labs  Lab 11/10/19 0958 11/11/19 0647 11/12/19 0605  WBC 16.3* 15.1* 10.3  RBC 4.51 4.32 4.60  HGB 14.3 13.7 14.4  HCT 42.3 40.9 43.7  MCV 93.8 94.7 95.0  MCH 31.7 31.7 31.3  MCHC 33.8 33.5 33.0  RDW 15.7* 15.8* 15.9*  PLT 223 211 235   BNPNo results for input(s): BNP, PROBNP in the last 168 hours.  DDimer No results for input(s): DDIMER in the last 168  hours.   Radiology/Studies:  CT Abdomen Pelvis W Contrast  Result Date: 11/09/2019 CLINICAL DATA:  Bowel obstruction suspected vomiting since esophageal dilation, unable to tolerate PO Increasing weakness. EXAM: CT ABDOMEN AND PELVIS WITH CONTRAST TECHNIQUE: Multidetector CT imaging of the abdomen and pelvis was performed using the standard protocol following bolus administration of intravenous contrast. CONTRAST:  63mL OMNIPAQUE IOHEXOL 300 MG/ML  SOLN COMPARISON:  CT 11 days ago 10/29/2019 FINDINGS: Lower chest: Again seen subpleural fat at the lung bases. No pleural fluid. No lower pneumomediastinum or esophageal wall thickening. No focal  consolidation. Hepatobiliary: Decreased hepatic density consistent with steatosis. Previous geographic fatty distribution is more homogeneous on the current exam, likely related to differences in contrast timing. There is no discrete focal hepatic lesion. Layering gallstones without pericholecystic inflammation. There is no biliary dilatation. No visualized choledocholithiasis. Pancreas: Fatty atrophy.  No ductal dilatation or inflammation. Spleen: Normal in size without focal abnormality. Adrenals/Urinary Tract: Normal right adrenal gland. Punctate left adrenal calcification unchanged. No hydronephrosis or perinephric edema. Again seen thinning of bilateral renal parenchyma. There are bilateral renal cysts are unchanged since recent exam. The urinary bladder is distended. No bladder wall thickening. Bladder(volume = 1580 cm^3). Stomach/Bowel: No distal esophageal wall thickening. Stomach is decompressed. Normal positioning of the duodenum and ligament of Treitz. Decompressed small bowel without obstruction or inflammation. Normal appendix. Small volume of stool throughout the colon. Sigmoid colon is tortuous. Distal colonic diverticulosis without focal diverticulitis. There is no colonic wall thickening or inflammation. Vascular/Lymphatic: Aorto bi-iliac atherosclerosis  without aortic aneurysm. The portal vein is patent. No acute vascular finding. No abdominopelvic adenopathy. Reproductive: Normal sized prostate gland, see details below regarding adjacent soft tissue air. Other: There is edema and soft tissue gas involving the left perineum extending to the base of the penis. Small perineal fluid collection measures 3.4 x 2.5 cm, series 2, image 87. Tracking soft tissue air extends superiorly along the inferior margin of the prostate gland. No abdominopelvic ascites. Musculoskeletal: Multilevel degenerative change throughout the spine. Degenerative change of the hips. No bony destruction or acute osseous abnormality. IMPRESSION: 1. No bowel obstruction. 2. Midline perineal abscess measuring 3.4 x 2.5 cm just below the base of the penis anterior to the rectum, with patchy soft tissue air tracking in the left perineum, into the subcutaneous tissues. Findings highly suspicious for Fournier's gangrene. 3. Distended urinary bladder without bladder wall thickening. 4. Hepatic steatosis. Cholelithiasis without gallbladder inflammation. 5. Colonic diverticulosis without diverticulitis. Aortic Atherosclerosis (ICD10-I70.0). These results were called by telephone at the time of interpretation on 11/09/2019 at 9:17 pm to Gary Apolonio Schneiders LITTLE , who verbally acknowledged these results. Electronically Signed   By: Keith Rake M.D.   On: 11/09/2019 21:18   ECHOCARDIOGRAM COMPLETE  Result Date: 11/11/2019    ECHOCARDIOGRAM REPORT   Patient Name:   Gary Butler  Date of Exam: 11/11/2019 Medical Rec #:  [id number]  Height:       71.0 in Accession #:    [id number] Weight:       302.9 lb Date of Birth:  1946-01-21  BSA:          2.515 m Patient Age:    10 years   BP:           98/58 mmHg Patient Gender: M          HR:           56 bpm. Exam Location:  Inpatient Procedure: 2D Echo Indications:    427.31 atrial fibrillation  History:        Patient has prior history of Echocardiogram examinations, most                  recent 10/11/2017. Risk Factors:Hypertension and Diabetes.  Sonographer:    Gary Butler RDCS (AE) Referring Phys: [id number] Sherryll Burger [facility]  Sonographer Comments: Technically difficult study due to poor echo windows, no parasternal window and patient is morbidly obese. Image acquisition challenging due to patient body habitus, Image acquisition challenging due to respiratory motion and restricted mobility. IMPRESSIONS  1. Left  ventricular ejection fraction, by estimation, is 60 to 65%. The left ventricle has normal function. The left ventricle has no regional wall motion abnormalities. Left ventricular diastolic parameters are consistent with Grade II diastolic dysfunction (pseudonormalization).  2. Right ventricular systolic function is normal. The right ventricular size is normal.  3. The mitral valve is normal in structure. No evidence of mitral valve regurgitation. No evidence of mitral stenosis.  4. The aortic valve is normal in structure. Aortic valve regurgitation is not visualized. No aortic stenosis is present.  5. The inferior vena cava is normal in size with greater than 50% respiratory variability, suggesting right atrial pressure of 3 mmHg. FINDINGS  Left Ventricle: Left ventricular ejection fraction, by estimation, is 60 to 65%. The left ventricle has normal function. The left ventricle has no regional wall motion abnormalities. Definity contrast agent was given IV to delineate the left ventricular  endocardial borders. The left ventricular internal cavity size was normal in size. There is no left ventricular hypertrophy. Left ventricular diastolic parameters are consistent with Grade II diastolic dysfunction (pseudonormalization). Right Ventricle: The right ventricular size is normal. No increase in right ventricular wall thickness. Right ventricular systolic function is normal. Left Atrium: Left atrial size was not well visualized. Right Atrium: Right atrial size was normal in size.  Pericardium: There is no evidence of pericardial effusion. Mitral Valve: The mitral valve is normal in structure. No evidence of mitral valve regurgitation. No evidence of mitral valve stenosis. Tricuspid Valve: The tricuspid valve is not well visualized. Tricuspid valve regurgitation is trivial. No evidence of tricuspid stenosis. Aortic Valve: The aortic valve is normal in structure. Aortic valve regurgitation is not visualized. No aortic stenosis is present. Pulmonic Valve: The pulmonic valve was normal in structure. Pulmonic valve regurgitation is not visualized. No evidence of pulmonic stenosis. Aorta: The aortic root is normal in size and structure. Venous: The inferior vena cava is normal in size with greater than 50% respiratory variability, suggesting right atrial pressure of 3 mmHg. IAS/Shunts: No atrial level shunt detected by color flow Doppler.   Diastology LV e' medial:    5.44 cm/s LV E/e' medial:  17.2 LV e' lateral:   6.53 cm/s LV E/e' lateral: 14.4  AORTIC VALVE LVOT Vmax:   58.10 cm/s LVOT Vmean:  42.500 cm/s LVOT VTI:    0.123 m MITRAL VALVE MV Area (PHT): 2.26 cm    SHUNTS MV Decel Time: 335 msec    Systemic VTI: 0.12 m MV E velocity: 93.80 cm/s MV A velocity: 38.60 cm/s MV E/A ratio:  2.43 Candee Furbish MD Electronically signed by Candee Furbish MD Signature Date/Time: 11/11/2019/3:31:15 PM    Final    {  Assessment and Plan:   New onset Afib, now in SR - In the setting of perirectal abscess/infection - EKG from 9/27 with aifb with RVR. Does not appear he was started on IV heparin. Heart rate and rhythm improved s/p I&D - Patient is not on tele so unsure when he converted to SR - EKG today shows SB - Rate controlled on bisoprolol. Monitor with soft pressures - TSH normal - HS troponin negative - Echo this admission showed LVEF 60-65%, G2DD - CHADSVASC 3 (age, CHD, HTN). Given this was in the setting of an acute event will disuss need for a/c with MD. Would consider telemetry. Otherwise  daily EKG. Can also consider OP monitor to assess for reoccurrence/afib burden   Perirectal abscess s/p I&D 11/10/19 - surgery following - abx per surgery/IM -  wound culture with rare gram-negative bacteria - BC negative  Chronic diastolic CHF - Echo as above - PTA lasix 20 mg daily, ARB, BB. However patient says he was not taking his lasix. He was using compression stockings - BB continued for rate control - ARB and Maxzide held for hypotension - Euvolemic on exam. continue to monitor fluid status  UTI - abx per IM  Acute urinary retention - Occurred in post-op and foley cath placed  HTN - continue home bisoprolol.  - Home telmisartan, maxzide and lasix held - Bps soft  DM2 - SSI per IM  For questions or updates, please contact [facility] Please consult [email or web address] for contact info under    Signed, Sandeep Delagarza Ninfa Meeker, Gary Butler  11/12/2019 10:17 AM

## 2019-11-12 NOTE — Progress Notes (Addendum)
PROGRESS NOTE  Gary Butler OEH:[id number] DOB: December 22, 1945 DOA: 11/09/2019 PCP: [name], MD  HPI/Recap of past 24 hours: HPI: 74 year old male with past medical history of OSA on BIPAP, obesity, chronic dCHF, hyperlipidemia, hypertension, diabetes mellitus type 2, mild cognitive impairment, gastroesophageal reflux disease and pertinent past medical history of a left-sided perianal fistula status post fistulotomy performed at [facility] in 2019. Patient presented to [facility] emergency department with complaints of frequent bouts of watery diarrhea and rectal pain.  Patient explains that for at least the past 6 weeks he has been experiencing what he describes as explosive bouts of watery diarrhea.  Associated with progressively worsening rectal discomfort and frequent bouts of vomiting and inability to tolerate oral intake for the past several days.  Underwent recent EGD approximately 1 week prior to presentation with esophageal dilation.   Presented to [facility] emergency department at the direction of his primary care provider.  At [facility],  CT abdomen and pelvis revealed evidence of abscess formation of 3.4 x 2.5 cm just below the base of the penis. There was initially concern for Fournier's gangrene and therefore after discussion with urology and general surgery patient was transferred to the [facility] long ED for further evaluation and management.   Work up revealed perineal abscess without Fournier's. Patient was taken to the operating room at [facility] long by Dr. Johney Maine for an incision and drainage. TRH was asked to admit.  11/12/19: Reports peri anal pain is improved.  Still has significant B/L LE pain worse with palpation.  His knees are also stiff.  Assessment/Plan: Principal Problem:   Suprasphincteric perirectal abscess s/p I&D 11/10/2019 Active Problems:   Obstructive sleep apnea   Essential hypertension   Uncontrolled type 2 diabetes  mellitus with hyperglycemia, with long-term current use of insulin (HCC)   Acute urinary retention s/p Foley 11/10/2019   Diarrhea   Perineal abscess   GERD without esophagitis   Atrial fibrillation (HCC)  Sepsis 2/2 Suprasphincteric perirectal abscess s/p I&D 11/10/2019 by Dr. Johney Maine, E-coli UTI  Patient is status post successful incision and drainage by Dr. Johney Maine  Surgery following and assisting with education on dressing changes  Continuing broad-spectrum intravenous antibiotic therapy until cultures result -we will continue to provide intravenous cefepime, Flagyl and vancomycin for coverage of both MRSA and anaerobic organisms  Wound culture growing rare e-coli and rare viridans streptococcus, culture reincubated for better growth.  Patient has a history of a perianal fistula requiring fistulotomy in 2019 at [facility]. The appearance of the abscess is concerning for the development of another fistula.  Blood cultures no growth to date  Pain control.  We appreciate surgery's assistance  New onset atrial fibrillation  Patient noted to be in atrial fibrillation on 11/09/19, in the setting of sepsis.  TSH normal 1.1, troponin within normal limit 15  2D echo done on 11/11/2019 showed LVEF 60 to 65% with grade 2 diastolic dysfunction  Chadsvasc score of 3  Cardiology consulted to advise on [facility]  He is on home Bisoprolol, this has controlled his rate  Added telemetry monitoring  E. coli UTI Urine culture growing greater than 100,000 colonies of E. coli with resistance to ampicillin, intermediate resistance to ampicillin/sulbactam, ciprofloxacin, and Bactrim. Continue cefepime. Monitor fever curve and WBCs  Severe lower extremity tenderness with palpation.  Rule out DVT Bilateral lower extremity Doppler ultrasound ordered, follow results.    Acute urinary retention s/p Foley placement on 11/10/2019 No  hydronephrosis or perinephric edema on CT scan abdomen and pelvis with  contrast done on 11/09/2019. Patient exhibited acute urinary retention postop in the PACU, Foley catheter placed As patient clinically improves will attempt to remove Foley catheter for voiding trial on 11/12/2019.  History of gout On allopurinol.    Diarrhea, improving  Patient reports 6 to 8-week history of diarrhea, etiology unclear  Had stool studies performed by his primary care provider a week or so ago that were negative for C. Difficile  No enteric precautions required  Review of medications reveals the patient is on atorvastatin which can frequently cause diarrhea, this was discontinued  Continue to monitor    Essential hypertension BPs are soft Currently on bisoprolol 5 mg daily  Maxzide held to avoid hypotension Continue to monitor vital signs    Uncontrolled type 2 diabetes mellitus with hyperglycemia,  Continue to hold off home oral hypoglycemics  Recent hemoglobin A1c was found to be elevated at 9.1 on 10/26/19  Continue SSI scale.  Severe morbid obesity BMI 42 Recommend weight loss outpatient with regular physical activity and healthy dieting Will benefit from bariatric surgery for weight loss evaluation  Physical debility PT to assess Fall precautions.        Code Status:  Full code Family Communication:  None at bedside.    Consultants:  General surgery  Cardiology  Procedures:  Post I&D  Antimicrobials:  IV vancomycin  Cefepime  IV Flagyl.  DVT prophylaxis: Subcu Lovenox daily  Status is: Inpatient    Dispo: The patient is from: Home              Anticipated d/c is to: Home with home health services versus SNF pending PT evaluation.              Anticipated d/c date is: 11/14/2019.               Patient currently not stable for discharge, ongoing management of perirectal wound.         Objective: Vitals:   11/11/19 1642 11/11/19 1922 11/11/19 2136 11/12/19 0609  BP: (!) 100/52  (!) 92/50 96/72  Pulse:  (!) 58  (!) 55 (!) 57  Resp: 19  20 18   Temp: 98.9 F (37.2 C)  98.2 F (36.8 C) (!) 97.5 F (36.4 C)  TempSrc:    Oral  SpO2: 96% 97% 94% 94%  Weight:      Height:        Intake/Output Summary (Last 24 hours) at 11/12/2019 1247 Last data filed at 11/12/2019 1200 Gross per 24 hour  Intake 1566.67 ml  Output 2650 ml  Net -1083.33 ml   Filed Weights   11/10/19 0835  Weight: (!) 137.4 kg    Exam:  . General: 74 y.o. year-old male pleasant, obese in no acute distress.  Alert and oriented x3.  . Cardiovascular: Regular rate and rhythm no rubs or gallops.   Marland Kitchen Respiratory: Clear to auscultation no wheezes or rales. . Abdomen: Obese nontender normal bowel sounds present.   . Musculoskeletal: Significant tenderness with palpation of lower extremities bilaterally.  Bilateral knee with scar from previous surgery. Marland Kitchen Psychiatry: Mood is appropriate for condition and setting.  Data Reviewed: CBC: Recent Labs  Lab 11/09/19 1833 11/10/19 0958 11/11/19 0647 11/12/19 0605  WBC 16.7* 16.3* 15.1* 10.3  NEUTROABS 15.3* 15.3* 13.5* 8.8*  HGB 16.3 14.3 13.7 14.4  HCT 46.7 42.3 40.9 43.7  MCV 90.9 93.8 94.7 95.0  PLT 277 223 211  876   Basic Metabolic Panel: Recent Labs  Lab 11/09/19 1833 11/10/19 0958 11/11/19 0647 11/12/19 0605  NA 132* 130* 133* 132*  K 3.3* 3.3* 3.4* 3.8  CL 94* 96* 101 101  CO2 21* 23 21* 21*  GLUCOSE 259* 231* 165* 133*  BUN 19 18 19 18   CREATININE 1.50* 1.22 1.14 1.09  CALCIUM 9.3 8.7* 8.4* 8.8*  MG  --  1.8  --  1.9   GFR: Estimated Creatinine Clearance: 84.2 mL/min (by C-G formula based on SCr of 1.09 mg/dL). Liver Function Tests: Recent Labs  Lab 11/09/19 1833 11/10/19 0958 11/11/19 0647  AST 25 16 16   ALT 15 14 12   ALKPHOS 92 74 70  BILITOT 1.4* 1.4* 1.2  PROT 6.8 5.8* 5.4*  ALBUMIN 3.3* 2.7* 2.6*   Recent Labs  Lab 11/09/19 1833  LIPASE 22   No results for input(s): AMMONIA in the last 168 hours. Coagulation Profile: No results  for input(s): INR, PROTIME in the last 168 hours. Cardiac Enzymes: No results for input(s): CKTOTAL, CKMB, CKMBINDEX, TROPONINI in the last 168 hours. BNP (last 3 results) No results for input(s): PROBNP in the last 8760 hours. HbA1C: No results for input(s): HGBA1C in the last 72 hours. CBG: Recent Labs  Lab 11/11/19 1140 11/11/19 1741 11/11/19 2201 11/12/19 0709 11/12/19 1129  GLUCAP 132* 175* 202* 134* 134*   Lipid Profile: No results for input(s): CHOL, HDL, LDLCALC, TRIG, CHOLHDL, LDLDIRECT in the last 72 hours. Thyroid Function Tests: Recent Labs    11/10/19 0958  TSH 1.141   Anemia Panel: No results for input(s): VITAMINB12, FOLATE, FERRITIN, TIBC, IRON, RETICCTPCT in the last 72 hours. Urine analysis:    Component Value Date/Time   COLORURINE YELLOW 11/09/2019 1937   APPEARANCEUR CLEAR 11/09/2019 1937   LABSPEC <1.005 (L) 11/09/2019 1937   PHURINE 5.5 11/09/2019 1937   GLUCOSEU >=500 (A) 11/09/2019 1937   HGBUR NEGATIVE 11/09/2019 1937   BILIRUBINUR NEGATIVE 11/09/2019 1937   KETONESUR NEGATIVE 11/09/2019 1937   PROTEINUR NEGATIVE 11/09/2019 1937   UROBILINOGEN 1.0 06/10/2013 1000   NITRITE NEGATIVE 11/09/2019 1937   LEUKOCYTESUR NEGATIVE 11/09/2019 1937   Sepsis Labs: @LABRCNTIP (procalcitonin:4,lacticidven:4)  ) Recent Results (from the past 240 hour(s))  Respiratory Panel by RT PCR (Flu A&B, Covid) - Nasopharyngeal Swab     Status: None   Collection Time: 11/09/19  7:37 PM   Specimen: Nasopharyngeal Swab  Result Value Ref Range Status   SARS Coronavirus 2 by RT PCR NEGATIVE NEGATIVE Final    Comment: (NOTE) SARS-CoV-2 target nucleic acids are NOT DETECTED.  The SARS-CoV-2 RNA is generally detectable in upper respiratoy specimens during the acute phase of infection. The lowest concentration of SARS-CoV-2 viral copies this assay can detect is 131 copies/mL. A negative result does not preclude SARS-Cov-2 infection and should not be used as the sole  basis for treatment or other patient management decisions. A negative result may occur with  improper specimen collection/handling, submission of specimen other than nasopharyngeal swab, presence of viral mutation(s) within the areas targeted by this assay, and inadequate number of viral copies (<131 copies/mL). A negative result must be combined with clinical observations, patient history, and epidemiological information. The expected result is Negative.  Fact Sheet for Patients:  PinkCheek.be  Fact Sheet for Healthcare Providers:  GravelBags.it  This test is no t yet approved or cleared by the [place] FDA and  has been authorized for detection and/or diagnosis of SARS-CoV-2 by FDA under an Emergency Use  Authorization (EUA). This EUA will remain  in effect (meaning this test can be used) for the duration of the COVID-19 declaration under Section 564(b)(1) of the Act, 21 U.S.C. section 360bbb-3(b)(1), unless the authorization is terminated or revoked sooner.     Influenza A by PCR NEGATIVE NEGATIVE Final   Influenza B by PCR NEGATIVE NEGATIVE Final    Comment: (NOTE) The Xpert Xpress SARS-CoV-2/FLU/RSV assay is intended as an aid in  the diagnosis of influenza from Nasopharyngeal swab specimens and  should not be used as a sole basis for treatment. Nasal washings and  aspirates are unacceptable for Xpert Xpress SARS-CoV-2/FLU/RSV  testing.  Fact Sheet for Patients: PinkCheek.be  Fact Sheet for Healthcare Providers: GravelBags.it  This test is not yet approved or cleared by the [place] FDA and  has been authorized for detection and/or diagnosis of SARS-CoV-2 by  FDA under an Emergency Use Authorization (EUA). This EUA will remain  in effect (meaning this test can be used) for the duration of the  Covid-19 declaration under Section 564(b)(1) of the  Act, 21  U.S.C. section 360bbb-3(b)(1), unless the authorization is  terminated or revoked. Performed at [facility], [address] [place], [place] [address]   Urine culture     Status: Abnormal   Collection Time: 11/09/19  7:37 PM   Specimen: Urine, Random  Result Value Ref Range Status   Specimen Description   Final    URINE, RANDOM Performed at [facility], [address] [place], [place] [address]    Special Requests   Final    NONE Performed at [facility] [address] [place], [place] [address]    Culture >=100,000 COLONIES/mL ESCHERICHIA COLI (A)  Final   Report Status 11/11/2019 FINAL  Final   Organism ID, Bacteria ESCHERICHIA COLI (A)  Final      Susceptibility   Escherichia coli - MIC*    AMPICILLIN >=32 RESISTANT Resistant     CEFAZOLIN <=4 SENSITIVE Sensitive     CEFTRIAXONE <=0.25 SENSITIVE Sensitive     CIPROFLOXACIN >=4 RESISTANT Resistant     GENTAMICIN <=1 SENSITIVE Sensitive     IMIPENEM <=0.25 SENSITIVE Sensitive     NITROFURANTOIN <=16 SENSITIVE Sensitive     TRIMETH/SULFA >=320 RESISTANT Resistant     AMPICILLIN/SULBACTAM 16 INTERMEDIATE Intermediate     PIP/TAZO <=4 SENSITIVE Sensitive     * >=100,000 COLONIES/mL ESCHERICHIA COLI  Culture, blood (routine x 2)     Status: None (Preliminary result)   Collection Time: 11/09/19  8:01 PM   Specimen: BLOOD RIGHT HAND  Result Value Ref Range Status   Specimen Description   Final    BLOOD RIGHT HAND Performed at [facility], [address] [place], [place] [address]    Special Requests   Final    BOTTLES DRAWN AEROBIC AND ANAEROBIC Blood Culture results may not be optimal due to an inadequate volume of blood received in culture bottles Performed at [facility], [address] [place], [place] [address]    Culture   Final    NO GROWTH 3 DAYS Performed at [facility], [place] [address], [place], [place] [address]    Report Status  PENDING  Incomplete  Culture, blood (routine x 2)     Status: None (Preliminary result)   Collection Time: 11/09/19  8:10 PM   Specimen: Right Antecubital; Blood  Result Value Ref Range  Status   Specimen Description   Final    RIGHT ANTECUBITAL Performed at [facility], [address] [place], [place] [address]    Special Requests   Final    BOTTLES DRAWN AEROBIC AND ANAEROBIC Blood Culture adequate volume Performed at [facility], [address] [place], [place] [address]    Culture   Final    NO GROWTH 3 DAYS Performed at [facility], [place] [address], [place], [place] [address]    Report Status PENDING  Incomplete  Aerobic/Anaerobic Culture (surgical/deep wound)     Status: None (Preliminary result)   Collection Time: 11/10/19  3:30 AM   Specimen: Abscess  Result Value Ref Range Status   Specimen Description   Final    ABSCESS Performed at [place] [address] [place], [place] [address]    Special Requests   Final    NONE Performed at [facility], [place] [address] [place], [place] [address]    Gram Stain   Final    FEW WBC SEEN FEW GRAM POSITIVE COCCI Performed at [facility], [place] [address], [place], [place] [address]    Culture   Final    RARE ESCHERICHIA COLI SUSCEPTIBILITIES TO FOLLOW RARE VIRIDANS STREPTOCOCCUS CULTURE REINCUBATED FOR BETTER GROWTH Performed at [facility], [place] [address] [place], [place] [address]    Report Status PENDING  Incomplete  MRSA PCR Screening     Status: None   Collection Time: 11/10/19  4:00 AM   Specimen: Nasopharyngeal  Result Value Ref Range Status   MRSA by PCR NEGATIVE NEGATIVE Final    Comment:        The GeneXpert MRSA Assay (FDA approved for NASAL specimens only), is one component of a comprehensive MRSA colonization surveillance program. It is not intended to diagnose MRSA infection nor to guide or monitor  treatment for MRSA infections. Performed at [facility], [place] [address] [place], [place] [address]       Studies: ECHOCARDIOGRAM COMPLETE  Result Date: 11/11/2019    ECHOCARDIOGRAM REPORT   Patient Name:   Gary Butler  Date of Exam: 11/11/2019 Medical Rec #:  [id number]  Height:       71.0 in Accession #:    [id number] Weight:       302.9 lb Date of Birth:  22-Sep-1945  BSA:          2.515 m Patient Age:    35 years   BP:           98/58 mmHg Patient Gender: M          HR:           56 bpm. Exam Location:  Inpatient Procedure: 2D Echo Indications:    427.31 atrial fibrillation  History:        Patient has prior history of Echocardiogram examinations, most                 recent 10/11/2017. Risk Factors:Hypertension and Diabetes.  Sonographer:    Jannett Celestine RDCS (AE) Referring Phys: [id number] Sherryll Burger [facility]  Sonographer Comments: Technically difficult study due to poor echo windows, no parasternal window and patient is morbidly obese. Image acquisition challenging due to patient body habitus, Image acquisition challenging due to respiratory motion and restricted mobility. IMPRESSIONS  1. Left ventricular ejection fraction, by estimation, is 60 to 65%. The left ventricle has normal function.  The left ventricle has no regional wall motion abnormalities. Left ventricular diastolic parameters are consistent with Grade II diastolic dysfunction (pseudonormalization).  2. Right ventricular systolic function is normal. The right ventricular size is normal.  3. The mitral valve is normal in structure. No evidence of mitral valve regurgitation. No evidence of mitral stenosis.  4. The aortic valve is normal in structure. Aortic valve regurgitation is not visualized. No aortic stenosis is present.  5. The inferior vena cava is normal in size with greater than 50% respiratory variability, suggesting right atrial pressure of 3 mmHg. FINDINGS  Left Ventricle: Left ventricular ejection fraction, by  estimation, is 60 to 65%. The left ventricle has normal function. The left ventricle has no regional wall motion abnormalities. Definity contrast agent was given IV to delineate the left ventricular  endocardial borders. The left ventricular internal cavity size was normal in size. There is no left ventricular hypertrophy. Left ventricular diastolic parameters are consistent with Grade II diastolic dysfunction (pseudonormalization). Right Ventricle: The right ventricular size is normal. No increase in right ventricular wall thickness. Right ventricular systolic function is normal. Left Atrium: Left atrial size was not well visualized. Right Atrium: Right atrial size was normal in size. Pericardium: There is no evidence of pericardial effusion. Mitral Valve: The mitral valve is normal in structure. No evidence of mitral valve regurgitation. No evidence of mitral valve stenosis. Tricuspid Valve: The tricuspid valve is not well visualized. Tricuspid valve regurgitation is trivial. No evidence of tricuspid stenosis. Aortic Valve: The aortic valve is normal in structure. Aortic valve regurgitation is not visualized. No aortic stenosis is present. Pulmonic Valve: The pulmonic valve was normal in structure. Pulmonic valve regurgitation is not visualized. No evidence of pulmonic stenosis. Aorta: The aortic root is normal in size and structure. Venous: The inferior vena cava is normal in size with greater than 50% respiratory variability, suggesting right atrial pressure of 3 mmHg. IAS/Shunts: No atrial level shunt detected by color flow Doppler.   Diastology LV e' medial:    5.44 cm/s LV E/e' medial:  17.2 LV e' lateral:   6.53 cm/s LV E/e' lateral: 14.4  AORTIC VALVE LVOT Vmax:   58.10 cm/s LVOT Vmean:  42.500 cm/s LVOT VTI:    0.123 m MITRAL VALVE MV Area (PHT): 2.26 cm    SHUNTS MV Decel Time: 335 msec    Systemic VTI: 0.12 m MV E velocity: 93.80 cm/s MV A velocity: 38.60 cm/s MV E/A ratio:  2.43 Candee Furbish MD  Electronically signed by Candee Furbish MD Signature Date/Time: 11/11/2019/3:31:15 PM    Final     Scheduled Meds: . acetaminophen  1,000 mg Oral TID  . allopurinol  300 mg Oral Daily  . aspirin EC  81 mg Oral Daily  . bisoprolol  5 mg Oral Daily  . Chlorhexidine Gluconate Cloth  6 each Topical Once   And  . Chlorhexidine Gluconate Cloth  6 each Topical Once  . cholecalciferol  2,000 Units Oral Daily  . donepezil  10 mg Oral QHS  . enoxaparin (LOVENOX) injection  70 mg Subcutaneous Q24H  . feeding supplement (KATE FARMS STANDARD 1.4)  325 mL Oral BID BM  . fluticasone  2 spray Each Nare Daily  . folic acid  1 mg Oral Daily  . insulin aspart  0-20 Units Subcutaneous TID AC & HS  . lip balm  1 application Topical BID  . loratadine  10 mg Oral Daily  . nystatin cream   Topical BID  .  pantoprazole  40 mg Oral Daily  . psyllium  1 packet Oral BID  . sertraline  50 mg Oral Daily  . sodium chloride flush  3 mL Intravenous Q12H  . vitamin B-12  2,000 mcg Oral Daily    Continuous Infusions: . sodium chloride 250 mL (11/11/19 1814)  . ceFEPime (MAXIPIME) IV 2 g (11/12/19 1148)  . methocarbamol (ROBAXIN) IV    . metronidazole 500 mg (11/12/19 0930)  . vancomycin 1,000 mg (11/11/19 2310)     LOS: 2 days     Kayleen Memos, MD [facility] Pager [phone]  If 7PM-7AM, please contact night-coverage [email or web address] Password [facility] 11/12/2019, 12:47 PM

## 2019-11-12 NOTE — Progress Notes (Signed)
Patient notified that he will be transferred to another unit r/t need of cardiac monitoring due to a-fib. Phone number and name left with [facility] [place]- receiving nurse is in a contact room at this time.

## 2019-11-12 NOTE — Progress Notes (Signed)
Patient more ambulatory today. Bath given. enforced the importance of patient receiving sitz baths, patient wants to try later.

## 2019-11-12 NOTE — Progress Notes (Signed)
Bilateral lower extremity venous duplex completed. Refer to "CV Proc" under chart review to view preliminary results.  11/12/2019 4:32 PM Kelby Aline., MHA, RVT, RDCS, RDMS

## 2019-11-12 NOTE — Plan of Care (Signed)
  Problem: Education: Goal: Knowledge of General Education information will improve Description: Including pain rating scale, medication(s)/side effects and non-pharmacologic comfort measures 11/12/2019 1224 by Deetta Perla, RN Outcome: Progressing 11/12/2019 1223 by Deetta Perla, RN Outcome: Progressing   Problem: Health Behavior/Discharge Planning: Goal: Ability to manage health-related needs will improve 11/12/2019 1224 by Williams Dietrick, Helane Gunther, RN Outcome: Progressing 11/12/2019 1223 by Deetta Perla, RN Outcome: Progressing   Problem: Clinical Measurements: Goal: Ability to maintain clinical measurements within normal limits will improve 11/12/2019 1224 by Deetta Perla, RN Outcome: Progressing 11/12/2019 1223 by Deetta Perla, RN Outcome: Progressing Goal: Will remain free from infection 11/12/2019 1224 by Deetta Perla, RN Outcome: Progressing 11/12/2019 1223 by Deetta Perla, RN Outcome: Progressing Goal: Diagnostic test results will improve 11/12/2019 1224 by Deetta Perla, RN Outcome: Progressing 11/12/2019 1223 by Deetta Perla, RN Outcome: Progressing Goal: Cardiovascular complication will be avoided 11/12/2019 1224 by Deetta Perla, RN Outcome: Progressing 11/12/2019 1223 by Deetta Perla, RN Outcome: Progressing   Problem: Activity: Goal: Risk for activity intolerance will decrease 11/12/2019 1224 by Stasha Naraine, Helane Gunther, RN Outcome: Progressing 11/12/2019 1223 by Deetta Perla, RN Outcome: Progressing   Problem: Nutrition: Goal: Adequate nutrition will be maintained 11/12/2019 1224 by Deetta Perla, RN Outcome: Progressing 11/12/2019 1223 by Deetta Perla, RN Outcome: Progressing   Problem: Coping: Goal: Level of anxiety will decrease 11/12/2019 1224 by Deetta Perla, RN Outcome: Progressing 11/12/2019 1223 by Deetta Perla, RN Outcome: Progressing   Problem: Elimination: Goal: Will not experience  complications related to bowel motility 11/12/2019 1224 by Deetta Perla, RN Outcome: Progressing 11/12/2019 1223 by Deetta Perla, RN Outcome: Progressing Goal: Will not experience complications related to urinary retention 11/12/2019 1224 by Deetta Perla, RN Outcome: Progressing 11/12/2019 1223 by Deetta Perla, RN Outcome: Progressing   Problem: Pain Managment: Goal: General experience of comfort will improve 11/12/2019 1224 by Deetta Perla, RN Outcome: Progressing 11/12/2019 1223 by Deetta Perla, RN Outcome: Progressing   Problem: Safety: Goal: Ability to remain free from injury will improve 11/12/2019 1224 by Deetta Perla, RN Outcome: Progressing 11/12/2019 1223 by Deetta Perla, RN Outcome: Progressing   Problem: Skin Integrity: Goal: Risk for impaired skin integrity will decrease 11/12/2019 1224 by Deetta Perla, RN Outcome: Progressing 11/12/2019 1223 by Deetta Perla, RN Outcome: Progressing

## 2019-11-13 DIAGNOSIS — K611 Rectal abscess: Secondary | ICD-10-CM | POA: Diagnosis not present

## 2019-11-13 LAB — CBC WITH DIFFERENTIAL/PLATELET
Abs Immature Granulocytes: 0.12 10*3/uL — ABNORMAL HIGH (ref 0.00–0.07)
Basophils Absolute: 0.1 10*3/uL (ref 0.0–0.1)
Basophils Relative: 1 %
Eosinophils Absolute: 0.3 10*3/uL (ref 0.0–0.5)
Eosinophils Relative: 4 %
HCT: 41.6 % (ref 39.0–52.0)
Hemoglobin: 14.3 g/dL (ref 13.0–17.0)
Immature Granulocytes: 2 %
Lymphocytes Relative: 11 %
Lymphs Abs: 0.8 10*3/uL (ref 0.7–4.0)
MCH: 31.6 pg (ref 26.0–34.0)
MCHC: 34.4 g/dL (ref 30.0–36.0)
MCV: 92 fL (ref 80.0–100.0)
Monocytes Absolute: 0.4 10*3/uL (ref 0.1–1.0)
Monocytes Relative: 6 %
Neutro Abs: 6.2 10*3/uL (ref 1.7–7.7)
Neutrophils Relative %: 76 %
Platelets: 162 10*3/uL (ref 150–400)
RBC: 4.52 MIL/uL (ref 4.22–5.81)
RDW: 15.9 % — ABNORMAL HIGH (ref 11.5–15.5)
WBC: 7.9 10*3/uL (ref 4.0–10.5)
nRBC: 0 % (ref 0.0–0.2)

## 2019-11-13 LAB — BASIC METABOLIC PANEL
Anion gap: 13 (ref 5–15)
BUN: 15 mg/dL (ref 8–23)
CO2: 16 mmol/L — ABNORMAL LOW (ref 22–32)
Calcium: 8.3 mg/dL — ABNORMAL LOW (ref 8.9–10.3)
Chloride: 103 mmol/L (ref 98–111)
Creatinine, Ser: 0.98 mg/dL (ref 0.61–1.24)
GFR calc Af Amer: >60 mL/min (ref >60)
GFR calc non Af Amer: >60 mL/min (ref >60)
Glucose, Bld: 127 mg/dL — ABNORMAL HIGH (ref 70–99)
Potassium: 4 mmol/L (ref 3.5–5.1)
Sodium: 132 mmol/L — ABNORMAL LOW (ref 135–145)

## 2019-11-13 LAB — GLUCOSE, CAPILLARY
Glucose-Capillary: 116 mg/dL — ABNORMAL HIGH (ref 70–99)
Glucose-Capillary: 158 mg/dL — ABNORMAL HIGH (ref 70–99)
Glucose-Capillary: 214 mg/dL — ABNORMAL HIGH (ref 70–99)
Glucose-Capillary: 169 mg/dL — ABNORMAL HIGH (ref 70–99)

## 2019-11-13 LAB — AEROBIC/ANAEROBIC CULTURE W GRAM STAIN (SURGICAL/DEEP WOUND)

## 2019-11-13 LAB — MAGNESIUM: Magnesium: 1.8 mg/dL (ref 1.7–2.4)

## 2019-11-13 MED ORDER — CEPHALEXIN 500 MG PO CAPS
500.0000 mg | ORAL_CAPSULE | Freq: Three times a day (TID) | ORAL | Status: DC
  Administered 2019-11-13 – 2019-11-14 (×2): 500 mg via ORAL
  Filled 2019-11-13 (×3): qty 1

## 2019-11-13 MED ORDER — METRONIDAZOLE 500 MG PO TABS
500.0000 mg | ORAL_TABLET | Freq: Three times a day (TID) | ORAL | Status: DC
  Administered 2019-11-13 – 2019-11-17 (×13): 500 mg via ORAL
  Filled 2019-11-13 (×13): qty 1

## 2019-11-13 NOTE — Progress Notes (Signed)
PROGRESS NOTE  Gary Butler GNO:[id number] DOB: Aug 22, 1945 DOA: 11/09/2019 PCP: Gary Minium, MD  HPI/Recap of past 24 hours: HPI: 74 year old male with past medical history of OSA on BIPAP, obesity, chronic dCHF, hyperlipidemia, hypertension, diabetes mellitus type 2, mild cognitive impairment, gastroesophageal reflux disease and pertinent past medical history of a left-sided perianal fistula status post fistulotomy performed at [facility] in 2019. Patient presented to [place] emergency department with complaints of frequent bouts of watery diarrhea and rectal pain.  Patient explains that for at least the past 6 weeks he has been experiencing what he describes as explosive bouts of watery diarrhea.  Associated with progressively worsening rectal discomfort and frequent bouts of vomiting and inability to tolerate oral intake for the past several days.  Underwent recent EGD approximately 1 week prior to presentation with esophageal dilation.   Presented to [facility] emergency department at the direction of his primary care provider.  At [facility],  CT abdomen and pelvis revealed evidence of abscess formation of 3.4 x 2.5 cm just below the base of the penis. There was initially concern for Fournier's gangrene and therefore after discussion with urology and general surgery patient was transferred to the [facility] long ED for further evaluation and management.   Work up revealed perineal abscess without Fournier's. Patient was taken to the operating room at [facility] long by Dr. Johney Butler for an incision and drainage. TRH was asked to admit.  11/13/19: Peri rectal pain is improving.  Leukocytosis and neutrophilia improved.  Switching to po abx keflex and po flagyl.   Assessment/Plan: Principal Problem:   Suprasphincteric perirectal abscess s/p I&D 11/10/2019 Active Problems:   Obstructive sleep apnea   Essential hypertension   Uncontrolled type 2 diabetes mellitus  with hyperglycemia, with long-term current use of insulin (HCC)   Acute urinary retention s/p Foley 11/10/2019   Diarrhea   Perineal abscess   GERD without esophagitis   Atrial fibrillation (HCC)  Sepsis, improving, 2/2 Suprasphincteric perirectal abscess s/p I&D 11/10/2019 by Dr. Johney Butler, E-coli UTI  Patient is status post successful incision and drainage by Dr. Johney Butler  Surgery following and assisting with education on dressing changes  Wound culture growing rare e-coli and rare viridans streptococcus, culture reincubated for better growth.  Switch Rocephin and IV flagyl to keflex and po flagyl  Patient has a history of a perianal fistula requiring fistulotomy in 2019 at [facility]. The appearance of the abscess is concerning for the development of another fistula.  Blood cultures no growth to date  Pain control.  We appreciate surgery's assistance  New onset atrial fibrillation  Patient noted to be in atrial fibrillation on 11/09/19, in the setting of sepsis.  TSH normal 1.1, troponin within normal limit 15  2D echo done on 11/11/2019 showed LVEF 60 to 65% with grade 2 diastolic dysfunction  Chadsvasc score of 3  Cardiology consulted to advise on [facility]  He is on home Bisoprolol, this has controlled his rate  Added telemetry monitoring  Started DOAC Eliquis on 9/30  Will follow up with Afib clinic/cardiology outpatient  E. coli UTI Urine culture growing greater than 100,000 colonies of E. coli with resistance to ampicillin, intermediate resistance to ampicillin/sulbactam, ciprofloxacin, and Bactrim.  Severe lower extremity tenderness with palpation.  Ruled out DVT with B/L doppler US    Acute urinary retention, post-op, s/p Foley placement on 11/10/2019 No hydronephrosis or perinephric edema on CT scan abdomen and pelvis with contrast done  on 11/09/2019. Patient exhibited acute urinary retention postop in the PACU, Foley catheter placed Foley removed 11/13/19, patient  receptive.  History of gout On allopurinol.    Diarrhea, improving  Patient reports 6 to 8-week history of diarrhea, etiology unclear  Had stool studies performed by his primary care provider a week or so ago that were negative for C. Difficile  No enteric precautions required  Review of medications reveals the patient is on atorvastatin which can frequently cause diarrhea, this was discontinued  Continue to monitor    Essential hypertension BPs are soft Currently on bisoprolol 5 mg daily  Maxzide held to avoid hypotension Continue to monitor vital signs    Uncontrolled type 2 diabetes mellitus with hyperglycemia,  Continue to hold off home oral hypoglycemics  Recent hemoglobin A1c was found to be elevated at 9.1 on 10/26/19  Continue SSI scale.  Severe morbid obesity BMI 42 Recommend weight loss outpatient with regular physical activity and healthy dieting Will benefit from bariatric surgery for weight loss evaluation  Physical debility PT recommended home health PT OT Fall precautions.        Code Status:  Full code Family Communication:  None at bedside.    Consultants:  General surgery  Cardiology  Procedures:  Post I&D  Antimicrobials:  IV vancomycin, completed  Cefepime, completed  IV Flagyl, completed  Keflex, po flagyl 11/13/19  DVT prophylaxis: Subcu Lovenox daily  Status is: Inpatient    Dispo: The patient is from: Home              Anticipated d/c is to: Home with home health services versus SNF pending PT evaluation.              Anticipated d/c date is: 11/14/2019.               Patient currently not stable for discharge, ongoing management of perirectal wound.         Objective: Vitals:   11/12/19 1900 11/12/19 2300 11/13/19 0439 11/13/19 1037  BP: (!) 98/54 98/60 116/80 108/78  Pulse: (!) 58 (!) 53 (!) 58 73  Resp:      Temp: 98.2 F (36.8 C) 98 F (36.7 C) 98 F (36.7 C)   TempSrc: Oral Oral Oral    SpO2: 92% 91% 93%   Weight:      Height:        Intake/Output Summary (Last 24 hours) at 11/13/2019 1503 Last data filed at 11/13/2019 0441 Gross per 24 hour  Intake 947.88 ml  Output 1200 ml  Net -252.12 ml   Filed Weights   11/10/19 0835  Weight: (!) 137.4 kg    Exam:  . General: 74 y.o. year-old male Pleasant, in no acute distress. A&O x 3 . Cardiovascular: RRR no rubs or gallops . Respiratory: CTA no wheezes or rales . Abdomen: Obese NT ND NBS . Musculoskeletal: Trace LE edema bilaterally. Bilateral knee with scar from previous surgery. Marland Kitchen Psychiatry: Mood is appropriate  Data Reviewed: CBC: Recent Labs  Lab 11/09/19 1833 11/10/19 0958 11/11/19 0647 11/12/19 0605 11/13/19 0549  WBC 16.7* 16.3* 15.1* 10.3 7.9  NEUTROABS 15.3* 15.3* 13.5* 8.8* 6.2  HGB 16.3 14.3 13.7 14.4 14.3  HCT 46.7 42.3 40.9 43.7 41.6  MCV 90.9 93.8 94.7 95.0 92.0  PLT 277 223 211 235 517   Basic Metabolic Panel: Recent Labs  Lab 11/09/19 1833 11/10/19 0958 11/11/19 0647 11/12/19 0605 11/13/19 0549  NA 132* 130* 133* 132* 132*  K 3.3*  3.3* 3.4* 3.8 4.0  CL 94* 96* 101 101 103  CO2 21* 23 21* 21* 16*  GLUCOSE 259* 231* 165* 133* 127*  BUN 19 18 19 18 15   CREATININE 1.50* 1.22 1.14 1.09 0.98  CALCIUM 9.3 8.7* 8.4* 8.8* 8.3*  MG  --  1.8  --  1.9 1.8   GFR: Estimated Creatinine Clearance: 93.6 mL/min (by C-G formula based on SCr of 0.98 mg/dL). Liver Function Tests: Recent Labs  Lab 11/09/19 1833 11/10/19 0958 11/11/19 0647  AST 25 16 16   ALT 15 14 12   ALKPHOS 92 74 70  BILITOT 1.4* 1.4* 1.2  PROT 6.8 5.8* 5.4*  ALBUMIN 3.3* 2.7* 2.6*   Recent Labs  Lab 11/09/19 1833  LIPASE 22   No results for input(s): AMMONIA in the last 168 hours. Coagulation Profile: No results for input(s): INR, PROTIME in the last 168 hours. Cardiac Enzymes: No results for input(s): CKTOTAL, CKMB, CKMBINDEX, TROPONINI in the last 168 hours. BNP (last 3 results) No results for input(s):  PROBNP in the last 8760 hours. HbA1C: No results for input(s): HGBA1C in the last 72 hours. CBG: Recent Labs  Lab 11/12/19 1129 11/12/19 1739 11/12/19 2152 11/13/19 0809 11/13/19 1141  GLUCAP 134* 150* 154* 116* 158*   Lipid Profile: No results for input(s): CHOL, HDL, LDLCALC, TRIG, CHOLHDL, LDLDIRECT in the last 72 hours. Thyroid Function Tests: No results for input(s): TSH, T4TOTAL, FREET4, T3FREE, THYROIDAB in the last 72 hours. Anemia Panel: No results for input(s): VITAMINB12, FOLATE, FERRITIN, TIBC, IRON, RETICCTPCT in the last 72 hours. Urine analysis:    Component Value Date/Time   COLORURINE YELLOW 11/09/2019 1937   APPEARANCEUR CLEAR 11/09/2019 1937   LABSPEC <1.005 (L) 11/09/2019 1937   PHURINE 5.5 11/09/2019 1937   GLUCOSEU >=500 (A) 11/09/2019 1937   HGBUR NEGATIVE 11/09/2019 1937   BILIRUBINUR NEGATIVE 11/09/2019 1937   KETONESUR NEGATIVE 11/09/2019 1937   PROTEINUR NEGATIVE 11/09/2019 1937   UROBILINOGEN 1.0 06/10/2013 1000   NITRITE NEGATIVE 11/09/2019 1937   LEUKOCYTESUR NEGATIVE 11/09/2019 1937   Sepsis Labs: @LABRCNTIP (procalcitonin:4,lacticidven:4)  ) Recent Results (from the past 240 hour(s))  Respiratory Panel by RT PCR (Flu A&B, Covid) - Nasopharyngeal Swab     Status: None   Collection Time: 11/09/19  7:37 PM   Specimen: Nasopharyngeal Swab  Result Value Ref Range Status   SARS Coronavirus 2 by RT PCR NEGATIVE NEGATIVE Final    Comment: (NOTE) SARS-CoV-2 target nucleic acids are NOT DETECTED.  The SARS-CoV-2 RNA is generally detectable in upper respiratoy specimens during the acute phase of infection. The lowest concentration of SARS-CoV-2 viral copies this assay can detect is 131 copies/mL. A negative result does not preclude SARS-Cov-2 infection and should not be used as the sole basis for treatment or other patient management decisions. A negative result may occur with  improper specimen collection/handling, submission of specimen  other than nasopharyngeal swab, presence of viral mutation(s) within the areas targeted by this assay, and inadequate number of viral copies (<131 copies/mL). A negative result must be combined with clinical observations, patient history, and epidemiological information. The expected result is Negative.  Fact Sheet for Patients:  PinkCheek.be  Fact Sheet for Healthcare Providers:  GravelBags.it  This test is no t yet approved or cleared by the [place] FDA and  has been authorized for detection and/or diagnosis of SARS-CoV-2 by FDA under an Emergency Use Authorization (EUA). This EUA will remain  in effect (meaning this test can be used) for the  duration of the COVID-19 declaration under Section 564(b)(1) of the Act, 21 U.S.C. section 360bbb-3(b)(1), unless the authorization is terminated or revoked sooner.     Influenza A by PCR NEGATIVE NEGATIVE Final   Influenza B by PCR NEGATIVE NEGATIVE Final    Comment: (NOTE) The Xpert Xpress SARS-CoV-2/FLU/RSV assay is intended as an aid in  the diagnosis of influenza from Nasopharyngeal swab specimens and  should not be used as a sole basis for treatment. Nasal washings and  aspirates are unacceptable for Xpert Xpress SARS-CoV-2/FLU/RSV  testing.  Fact Sheet for Patients: PinkCheek.be  Fact Sheet for Healthcare Providers: GravelBags.it  This test is not yet approved or cleared by the [place] FDA and  has been authorized for detection and/or diagnosis of SARS-CoV-2 by  FDA under an Emergency Use Authorization (EUA). This EUA will remain  in effect (meaning this test can be used) for the duration of the  Covid-19 declaration under Section 564(b)(1) of the Act, 21  U.S.C. section 360bbb-3(b)(1), unless the authorization is  terminated or revoked. Performed at [facility], [address]  [place], [place] [address]   Urine culture     Status: Abnormal   Collection Time: 11/09/19  7:37 PM   Specimen: Urine, Random  Result Value Ref Range Status   Specimen Description   Final    URINE, RANDOM Performed at [facility], [address] [place], [place] [address]    Special Requests   Final    NONE Performed at [facility], [address] [place], [place] [address]    Culture >=100,000 COLONIES/mL ESCHERICHIA COLI (A)  Final   Report Status 11/11/2019 FINAL  Final   Organism ID, Bacteria ESCHERICHIA COLI (A)  Final      Susceptibility   Escherichia coli - MIC*    AMPICILLIN >=32 RESISTANT Resistant     CEFAZOLIN <=4 SENSITIVE Sensitive     CEFTRIAXONE <=0.25 SENSITIVE Sensitive     CIPROFLOXACIN >=4 RESISTANT Resistant     GENTAMICIN <=1 SENSITIVE Sensitive     IMIPENEM <=0.25 SENSITIVE Sensitive     NITROFURANTOIN <=16 SENSITIVE Sensitive     TRIMETH/SULFA >=320 RESISTANT Resistant     AMPICILLIN/SULBACTAM 16 INTERMEDIATE Intermediate     PIP/TAZO <=4 SENSITIVE Sensitive     * >=100,000 COLONIES/mL ESCHERICHIA COLI  Culture, blood (routine x 2)     Status: None (Preliminary result)   Collection Time: 11/09/19  8:01 PM   Specimen: BLOOD RIGHT HAND  Result Value Ref Range Status   Specimen Description   Final    BLOOD RIGHT HAND Performed at [facility], [address] [place], [place] [address]    Special Requests   Final    BOTTLES DRAWN AEROBIC AND ANAEROBIC Blood Culture results may not be optimal due to an inadequate volume of blood received in culture bottles Performed at [facility], [address] [place], [place] [address]    Culture   Final    NO GROWTH 4 DAYS Performed at [facility], [place] [address] [place], [place] [address]    Report Status PENDING  Incomplete  Culture, blood (routine x 2)     Status: None (Preliminary result)   Collection Time: 11/09/19  8:10 PM   Specimen: Right  Antecubital; Blood  Result Value Ref Range Status   Specimen Description   Final    RIGHT ANTECUBITAL Performed at [facility]  [address], [place] [place], [place] [address]    Special Requests   Final    BOTTLES DRAWN AEROBIC AND ANAEROBIC Blood Culture adequate volume Performed at [facility], [address] [place], [place] [address]    Culture   Final    NO GROWTH 4 DAYS Performed at [facility], [place] [address], [place], [place] [address]    Report Status PENDING  Incomplete  Aerobic/Anaerobic Culture (surgical/deep wound)     Status: None   Collection Time: 11/10/19  3:30 AM   Specimen: Abscess  Result Value Ref Range Status   Specimen Description   Final    ABSCESS Performed at [place] [facility] [address], [place], [place] [address]    Special Requests   Final    NONE Performed at [facility], [place] [address], [place], [place] [address]    Gram Stain   Final    FEW WBC SEEN FEW GRAM POSITIVE COCCI Performed at [facility], [place] [address] [place], [place] [address]    Culture   Final    RARE ESCHERICHIA COLI RARE VIRIDANS STREPTOCOCCUS RARE BACTEROIDES FRAGILIS RARE BACTEROIDES OVATUS BETA LACTAMASE POSITIVE Performed at [facility], [place] [address], [place], [place] [address]    Report Status 11/13/2019 FINAL  Final   Organism ID, Bacteria ESCHERICHIA COLI  Final   Organism ID, Bacteria VIRIDANS STREPTOCOCCUS  Final      Susceptibility   Escherichia coli - MIC*    AMPICILLIN >=32 RESISTANT Resistant     CEFAZOLIN <=4 SENSITIVE Sensitive     CEFEPIME <=0.12 SENSITIVE Sensitive     CEFTAZIDIME <=1 SENSITIVE Sensitive     CEFTRIAXONE <=0.25 SENSITIVE Sensitive     CIPROFLOXACIN >=4 RESISTANT Resistant     GENTAMICIN <=1 SENSITIVE Sensitive     IMIPENEM <=0.25 SENSITIVE Sensitive     TRIMETH/SULFA >=320 RESISTANT Resistant     AMPICILLIN/SULBACTAM 16 INTERMEDIATE  Intermediate     PIP/TAZO <=4 SENSITIVE Sensitive     * RARE ESCHERICHIA COLI   Viridans streptococcus - MIC*    PENICILLIN <=0.06 SENSITIVE Sensitive     CEFTRIAXONE 0.25 SENSITIVE Sensitive     ERYTHROMYCIN <=0.12 SENSITIVE Sensitive     LEVOFLOXACIN 0.5 SENSITIVE Sensitive     VANCOMYCIN 0.5 SENSITIVE Sensitive     * RARE VIRIDANS STREPTOCOCCUS  MRSA PCR Screening     Status: None   Collection Time: 11/10/19  4:00 AM   Specimen: Nasopharyngeal  Result Value Ref Range Status   MRSA by PCR NEGATIVE NEGATIVE Final    Comment:        The GeneXpert MRSA Assay (FDA approved for NASAL specimens only), is one component of a comprehensive MRSA colonization surveillance program. It is not intended to diagnose MRSA infection nor to guide or monitor treatment for MRSA infections. Performed at [facility], [employer] [address], [place], [place] [address]       Studies: VAS [place] LOWER EXTREMITY VENOUS (DVT)  Result Date: 11/12/2019  Lower Venous DVTStudy Indications: Chronic bilateral calf pain.  Limitations: Body habitus, poor ultrasound/tissue interface and patient discomfort. Comparison Study: No prior study Performing Technologist: Maudry Mayhew MHA, RDMS, RVT, RDCS  Examination Guidelines: A complete evaluation includes B-mode imaging, spectral Doppler, color Doppler, and power Doppler as needed of all accessible portions of each vessel. Bilateral testing is considered an integral part of a complete examination. Limited examinations for reoccurring  indications may be performed as noted. The reflux portion of the exam is performed with the patient in reverse Trendelenburg.  +---------+---------------+---------+-----------+----------+--------------+ RIGHT    CompressibilityPhasicitySpontaneityPropertiesThrombus Aging +---------+---------------+---------+-----------+----------+--------------+ FV Prox  Full                    Yes        Patent                    +---------+---------------+---------+-----------+----------+--------------+ FV Mid                           Yes                                 +---------+---------------+---------+-----------+----------+--------------+ FV DistalFull                                                        +---------+---------------+---------+-----------+----------+--------------+ PFV      Full                    Yes                                 +---------+---------------+---------+-----------+----------+--------------+ POP      Full           No       Yes                                 +---------+---------------+---------+-----------+----------+--------------+ PTV      Full                    Yes                                 +---------+---------------+---------+-----------+----------+--------------+ PERO     Full                    Yes                                 +---------+---------------+---------+-----------+----------+--------------+   Right Technical Findings: Not visualized segments include SFJ, CFV.  +---------+---------------+---------+-----------+----------+--------------+ LEFT     CompressibilityPhasicitySpontaneityPropertiesThrombus Aging +---------+---------------+---------+-----------+----------+--------------+ CFV      Full           No       Yes                                 +---------+---------------+---------+-----------+----------+--------------+ FV Prox  Full                                                        +---------+---------------+---------+-----------+----------+--------------+ FV Mid   Full                                                        +---------+---------------+---------+-----------+----------+--------------+  FV Distal                        Yes                                 +---------+---------------+---------+-----------+----------+--------------+ POP      Full           No       Yes                                  +---------+---------------+---------+-----------+----------+--------------+ PTV      Full                                                        +---------+---------------+---------+-----------+----------+--------------+ PERO     Full                                                        +---------+---------------+---------+-----------+----------+--------------+   Left Technical Findings: Not visualized segments include SFJ, PFV, limited evaluation of PTV and peroneal veins.   Summary: RIGHT: - There is no evidence of deep vein thrombosis in the lower extremity. However, portions of this examination were limited- see technologist comments above.  - No cystic structure found in the popliteal fossa.  LEFT: - There is no evidence of deep vein thrombosis in the lower extremity. However, portions of this examination were limited- see technologist comments above.  - No cystic structure found in the popliteal fossa.  Bilateral lower extremity venous flow is pulsatile, suggestive of possibly elevated right heart pressure.  *See table(s) above for measurements and observations. Electronically signed by Monica Martinez MD on 11/12/2019 at 5:07:25 PM.    Final     Scheduled Meds: . acetaminophen  1,000 mg Oral TID  . allopurinol  300 mg Oral Daily  . apixaban  5 mg Oral BID  . aspirin EC  81 mg Oral Daily  . bisoprolol  5 mg Oral Daily  . Chlorhexidine Gluconate Cloth  6 each Topical Once   And  . Chlorhexidine Gluconate Cloth  6 each Topical Once  . cholecalciferol  2,000 Units Oral Daily  . donepezil  10 mg Oral QHS  . feeding supplement ([employer] STANDARD 1.4)  325 mL Oral BID BM  . fluticasone  2 spray Each Nare Daily  . folic acid  1 mg Oral Daily  . insulin aspart  0-20 Units Subcutaneous TID AC & HS  . lip balm  1 application Topical BID  . loratadine  10 mg Oral Daily  . nystatin cream   Topical BID  . pantoprazole  40 mg Oral Daily  . psyllium  1 packet Oral  BID  . sertraline  50 mg Oral Daily  . sodium chloride flush  3 mL Intravenous Q12H  . tamsulosin  0.4 mg Oral QPC supper  . vitamin B-12  2,000 mcg Oral Daily    Continuous Infusions: . sodium chloride 250 mL (11/11/19 1814)  . sodium chloride 50 mL/hr at 11/13/19  0715  . cefTRIAXone (ROCEPHIN)  IV 2 g (11/12/19 2224)  . methocarbamol (ROBAXIN) IV    . metronidazole 500 mg (11/13/19 0844)     LOS: 3 days     Kayleen Memos, MD [facility] Pager [phone]  If 7PM-7AM, please contact night-coverage [email or web address] Password [email or web address] 11/13/2019, 3:03 PM

## 2019-11-13 NOTE — Progress Notes (Signed)
[facility] Progress Note  3 Days Post-Op  Subjective: Patient reports perirectal pain is improving. Reports drainage from abscess. Has not been in the shower or done sitz baths. Reiterated importance of this and patient verbalized understanding.   Objective: Vital signs in last 24 hours: Temp:  [97.6 F (36.4 C)-98.2 F (36.8 C)] 98 F (36.7 C) (10/01 0439) Pulse Rate:  [53-62] 58 (10/01 0439) Resp:  [18] 18 (09/30 1510) BP: (86-116)/(52-80) 116/80 (10/01 0439) SpO2:  [91 %-96 %] 93 % (10/01 0439) Last BM Date: 11/04/19  Intake/Output from previous day: 09/30 0701 - 10/01 0700 In: 1831.2 [P.O.:720; I.V.:527.9; IV Piggyback:583.3] Out: 1200 [Urine:1200] Intake/Output this shift: No intake/output data recorded.  PE: General: pleasant, WD, obese, chronically ill appearing male who is laying in bed in NAD Abd: soft, NT, ND GU: perirectal incision with copious purulence and some surrounding erythema    Lab Results:  Recent Labs    11/12/19 0605 11/13/19 0549  WBC 10.3 7.9  HGB 14.4 14.3  HCT 43.7 41.6  PLT 235 162   BMET Recent Labs    11/12/19 0605 11/13/19 0549  NA 132* 132*  K 3.8 4.0  CL 101 103  CO2 21* 16*  GLUCOSE 133* 127*  BUN 18 15  CREATININE 1.09 0.98  CALCIUM 8.8* 8.3*   PT/INR No results for input(s): LABPROT, INR in the last 72 hours. CMP     Component Value Date/Time   NA 132 (L) 11/13/2019 0549   NA 139 06/17/2013 0946   K 4.0 11/13/2019 0549   K 3.5 06/17/2013 0946   CL 103 11/13/2019 0549   CL 102 05/14/2012 0856   CO2 16 (L) 11/13/2019 0549   CO2 27 06/17/2013 0946   GLUCOSE 127 (H) 11/13/2019 0549   GLUCOSE 184 (H) 06/17/2013 0946   GLUCOSE 135 (H) 05/14/2012 0856   BUN 15 11/13/2019 0549   BUN 13.8 06/17/2013 0946   CREATININE 0.98 11/13/2019 0549   CREATININE 0.95 07/14/2015 1019   CREATININE 1.1 06/17/2013 0946   CALCIUM 8.3 (L) 11/13/2019 0549   CALCIUM 9.3 06/17/2013 0946   PROT 5.4 (L) 11/11/2019 0647    PROT 5.7 (L) 06/17/2013 0946   ALBUMIN 2.6 (L) 11/11/2019 0647   ALBUMIN 3.2 (L) 06/17/2013 0946   AST 16 11/11/2019 0647   AST 19 06/17/2013 0946   ALT 12 11/11/2019 0647   ALT 18 06/17/2013 0946   ALKPHOS 70 11/11/2019 0647   ALKPHOS 101 06/17/2013 0946   BILITOT 1.2 11/11/2019 0647   BILITOT 1.54 (H) 06/17/2013 0946   GFRNONAA >60 11/13/2019 0549   GFRAA >60 11/13/2019 0549   Lipase     Component Value Date/Time   LIPASE 22 11/09/2019 1833       Studies/Results: ECHOCARDIOGRAM COMPLETE  Result Date: 11/11/2019    ECHOCARDIOGRAM REPORT   Patient Name:   Gary Butler  Date of Exam: 11/11/2019 Medical Rec #:  [id number]  Height:       71.0 in Accession #:    [id number] Weight:       302.9 lb Date of Birth:  06/02/1945  BSA:          2.515 m Patient Age:    74 years   BP:           98/58 mmHg Patient Gender: M          HR:           56 bpm. Exam Location:  Inpatient Procedure: 2D  Echo Indications:    427.31 atrial fibrillation  History:        Patient has prior history of Echocardiogram examinations, most                 recent 10/11/2017. Risk Factors:Hypertension and Diabetes.  Sonographer:    Jannett Celestine RDCS (AE) Referring Phys: [id number] Sherryll Burger [facility]  Sonographer Comments: Technically difficult study due to poor echo windows, no parasternal window and patient is morbidly obese. Image acquisition challenging due to patient body habitus, Image acquisition challenging due to respiratory motion and restricted mobility. IMPRESSIONS  1. Left ventricular ejection fraction, by estimation, is 60 to 65%. The left ventricle has normal function. The left ventricle has no regional wall motion abnormalities. Left ventricular diastolic parameters are consistent with Grade II diastolic dysfunction (pseudonormalization).  2. Right ventricular systolic function is normal. The right ventricular size is normal.  3. The mitral valve is normal in structure. No evidence of mitral valve regurgitation. No  evidence of mitral stenosis.  4. The aortic valve is normal in structure. Aortic valve regurgitation is not visualized. No aortic stenosis is present.  5. The inferior vena cava is normal in size with greater than 50% respiratory variability, suggesting right atrial pressure of 3 mmHg. FINDINGS  Left Ventricle: Left ventricular ejection fraction, by estimation, is 60 to 65%. The left ventricle has normal function. The left ventricle has no regional wall motion abnormalities. Definity contrast agent was given IV to delineate the left ventricular  endocardial borders. The left ventricular internal cavity size was normal in size. There is no left ventricular hypertrophy. Left ventricular diastolic parameters are consistent with Grade II diastolic dysfunction (pseudonormalization). Right Ventricle: The right ventricular size is normal. No increase in right ventricular wall thickness. Right ventricular systolic function is normal. Left Atrium: Left atrial size was not well visualized. Right Atrium: Right atrial size was normal in size. Pericardium: There is no evidence of pericardial effusion. Mitral Valve: The mitral valve is normal in structure. No evidence of mitral valve regurgitation. No evidence of mitral valve stenosis. Tricuspid Valve: The tricuspid valve is not well visualized. Tricuspid valve regurgitation is trivial. No evidence of tricuspid stenosis. Aortic Valve: The aortic valve is normal in structure. Aortic valve regurgitation is not visualized. No aortic stenosis is present. Pulmonic Valve: The pulmonic valve was normal in structure. Pulmonic valve regurgitation is not visualized. No evidence of pulmonic stenosis. Aorta: The aortic root is normal in size and structure. Venous: The inferior vena cava is normal in size with greater than 50% respiratory variability, suggesting right atrial pressure of 3 mmHg. IAS/Shunts: No atrial level shunt detected by color flow Doppler.   Diastology LV e' medial:     5.44 cm/s LV E/e' medial:  17.2 LV e' lateral:   6.53 cm/s LV E/e' lateral: 14.4  AORTIC VALVE LVOT Vmax:   58.10 cm/s LVOT Vmean:  42.500 cm/s LVOT VTI:    0.123 m MITRAL VALVE MV Area (PHT): 2.26 cm    SHUNTS MV Decel Time: 335 msec    Systemic VTI: 0.12 m MV E velocity: 93.80 cm/s MV A velocity: 38.60 cm/s MV E/A ratio:  2.43 Candee Furbish MD Electronically signed by Candee Furbish MD Signature Date/Time: 11/11/2019/3:31:15 PM    Final    VAS [place] LOWER EXTREMITY VENOUS (DVT)  Result Date: 11/12/2019  Lower Venous DVTStudy Indications: Chronic bilateral calf pain.  Limitations: Body habitus, poor ultrasound/tissue interface and patient discomfort. Comparison Study: No prior study Performing Technologist:  Michelle Simonetti MHA, RDMS, RVT, RDCS  Examination Guidelines: A complete evaluation includes B-mode imaging, spectral Doppler, color Doppler, and power Doppler as needed of all accessible portions of each vessel. Bilateral testing is considered an integral part of a complete examination. Limited examinations for reoccurring indications may be performed as noted. The reflux portion of the exam is performed with the patient in reverse Trendelenburg.  +---------+---------------+---------+-----------+----------+--------------+ RIGHT    CompressibilityPhasicitySpontaneityPropertiesThrombus Aging +---------+---------------+---------+-----------+----------+--------------+ FV Prox  Full                    Yes        Patent                   +---------+---------------+---------+-----------+----------+--------------+ FV Mid                           Yes                                 +---------+---------------+---------+-----------+----------+--------------+ FV DistalFull                                                        +---------+---------------+---------+-----------+----------+--------------+ PFV      Full                    Yes                                  +---------+---------------+---------+-----------+----------+--------------+ POP      Full           No       Yes                                 +---------+---------------+---------+-----------+----------+--------------+ PTV      Full                    Yes                                 +---------+---------------+---------+-----------+----------+--------------+ PERO     Full                    Yes                                 +---------+---------------+---------+-----------+----------+--------------+   Right Technical Findings: Not visualized segments include SFJ, CFV.  +---------+---------------+---------+-----------+----------+--------------+ LEFT     CompressibilityPhasicitySpontaneityPropertiesThrombus Aging +---------+---------------+---------+-----------+----------+--------------+ CFV      Full           No       Yes                                 +---------+---------------+---------+-----------+----------+--------------+ FV Prox  Full                                                        +---------+---------------+---------+-----------+----------+--------------+  FV Mid   Full                                                        +---------+---------------+---------+-----------+----------+--------------+ FV Distal                        Yes                                 +---------+---------------+---------+-----------+----------+--------------+ POP      Full           No       Yes                                 +---------+---------------+---------+-----------+----------+--------------+ PTV      Full                                                        +---------+---------------+---------+-----------+----------+--------------+ PERO     Full                                                        +---------+---------------+---------+-----------+----------+--------------+   Left Technical Findings: Not visualized segments  include SFJ, PFV, limited evaluation of PTV and peroneal veins.   Summary: RIGHT: - There is no evidence of deep vein thrombosis in the lower extremity. However, portions of this examination were limited- see technologist comments above.  - No cystic structure found in the popliteal fossa.  LEFT: - There is no evidence of deep vein thrombosis in the lower extremity. However, portions of this examination were limited- see technologist comments above.  - No cystic structure found in the popliteal fossa.  Bilateral lower extremity venous flow is pulsatile, suggestive of possibly elevated right heart pressure.  *See table(s) above for measurements and observations. Electronically signed by Monica Martinez MD on 11/12/2019 at 5:07:25 PM.    Final     Anti-infectives: Anti-infectives (From admission, onward)   Start     Dose/Rate Route Frequency Ordered Stop   11/12/19 2000  cefTRIAXone (ROCEPHIN) 2 g in sodium chloride 0.9 % 100 mL IVPB        2 g 200 mL/hr over 30 Minutes Intravenous Daily at bedtime 11/12/19 1357     11/10/19 1000  vancomycin (VANCOCIN) IVPB 1000 mg/200 mL premix  Status:  Discontinued        1,000 mg 200 mL/hr over 60 Minutes Intravenous Every 12 hours 11/09/19 2134 11/12/19 1357   11/10/19 1000  fluconazole (DIFLUCAN) tablet 200 mg        200 mg Oral Daily 11/10/19 0132 11/12/19 0850   11/10/19 0900  metroNIDAZOLE (FLAGYL) IVPB 500 mg        500 mg 100 mL/hr over 60 Minutes Intravenous Every 8 hours 11/10/19 0731     11/10/19 0600  ceFEPIme (MAXIPIME) 2 g in sodium  chloride 0.9 % 100 mL IVPB  Status:  Discontinued        2 g 200 mL/hr over 30 Minutes Intravenous Every 8 hours 11/09/19 2134 11/12/19 1357   11/10/19 0600  clindamycin (CLEOCIN) IVPB 900 mg  Status:  Discontinued        900 mg 100 mL/hr over 30 Minutes Intravenous On call to O.R. 11/10/19 0113 11/10/19 0516   11/09/19 2130  ceFEPIme (MAXIPIME) 2 g in sodium chloride 0.9 % 100 mL IVPB        2 g 200 mL/hr over 30  Minutes Intravenous  Once 11/09/19 2128 11/09/19 2217   11/09/19 2130  vancomycin (VANCOCIN) IVPB 1000 mg/200 mL premix        1,000 mg 200 mL/hr over 60 Minutes Intravenous Every hour 11/09/19 2128 11/09/19 2333   11/09/19 2115  clindamycin (CLEOCIN) IVPB 900 mg        900 mg 100 mL/hr over 30 Minutes Intravenous  Once 11/09/19 2114 11/09/19 2147       Assessment/Plan OSA Morbid obesity HTN DM  UTI - e.coli -- per medicine --  POD3, s/p I&D of suprasphincteric perirectal abscess -WBC remains normal - still copious purulent drainage, patient has not gotten in shower or done sitz baths.  Discussed importance of this with patient again. This should clean his wound up overall. -abx can be switched to oral from IV from my standpoint -CX: rare E.coli and rare viridians streptococcus, reincubated -patient is otherwise surgically stable for DC home when felt medically stable for DC -follow up with is being arranged with our office currently.   FEN -carb mod diet VTE -Lovenox ID -rocephin/flagyl  LOS: 3 days    Norm Parcel , [facility] 11/13/2019, 8:47 AM Please see Amion for pager number during day hours 7:00am-4:30pm

## 2019-11-13 NOTE — TOC Initial Note (Addendum)
Transition of Care [facility]) - Initial/Assessment Note    Patient Details  Name: Gary Butler MRN: [id number] Date of Birth: Jan 01, 1946  [facility] CM/SW Contact:    Lynnell Catalan, RN Phone Number: 11/13/2019, 1:59 PM  Clinical Narrative:                 [facility] consult for [facility] orders. This CM met with pt at bedside. He states that he has used [facility] previously and would like to use them again. [place] liaison contacted for referral. Pt states that he lives with his wife who can help with dressing changes.  Addendum: [facility] liaison called back and unable to take pt at this time. This CM contacted [facility] liaison who accepted referral. This CM spoke with both pt and husband to inform of home health company change.    Expected Discharge Plan: [place] Barriers to Discharge: Continued Medical Work up   Patient Goals and CMS Choice Patient states their goals for this hospitalization and ongoing recovery are:: TO get home CMS Medicare.gov Compare Post Acute Care list provided to:: Patient Choice offered to / list presented to : Patient  Expected Discharge Plan and Services Expected Discharge Plan: [place]   Discharge Planning Services: CM Consult Post Acute Care Choice: [place] arrangements for the past 2 months: Single Family Home                           HH Arranged: PT, OT, RN [place] Agency: [place] Date [place]: 11/13/19 Time HH Agency Contacted: 14 Representative spoke with at [place]: [place] Arrangements/Services Living arrangements for the past 2 months: [place] with:: Spouse Patient language and need for interpreter reviewed:: Yes Do you feel safe going back to the place where you live?: Yes      Need for Family Participation in Patient Care: Yes (Comment) Care giver support system in place?: Yes (comment)   Criminal  Activity/Legal Involvement Pertinent to Current Situation/Hospitalization: No - Comment as needed  Activities of Daily Living Home Assistive Devices/Equipment: CBG Meter, Eyeglasses, Other (Comment) (grabber) ADL Screening (condition at time of admission) Patient's cognitive ability adequate to safely complete daily activities?: Yes Is the patient deaf or have difficulty hearing?: Yes Does the patient have difficulty seeing, even when wearing glasses/contacts?: No Does the patient have difficulty concentrating, remembering, or making decisions?: No Patient able to express need for assistance with ADLs?: Yes Does the patient have difficulty dressing or bathing?: No Independently performs ADLs?: Yes (appropriate for developmental age) Does the patient have difficulty walking or climbing stairs?: Yes Weakness of Legs: Both Weakness of Arms/Hands: Both  Permission Sought/Granted Permission sought to share information with : Chartered certified accountant granted to share information with : Yes, Verbal Permission Granted     Permission granted to share info w AGENCY: [facility])        Emotional Assessment Appearance:: Appears stated age Attitude/Demeanor/Rapport: Engaged Affect (typically observed): Calm Orientation: : Oriented to Self, Oriented to Place, Oriented to  Time, Oriented to Situation      Admission diagnosis:  AKI (acute kidney injury) ([place]) [N17.9] 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 Acute urinary retention s/p Foley 11/10/2019 11/10/2019  .  Suprasphincteric perirectal abscess s/p I&D 11/10/2019 11/10/2019  . Diarrhea 11/10/2019  . Perineal abscess 11/10/2019  . GERD without esophagitis 11/10/2019  . Atrial fibrillation ([place]) 11/10/2019  . Poorly controlled diabetes mellitus ([place])  11/09/2019  . Dyspnea on exertion 02/19/2017  . Bilateral lower extremity edema 07/06/2015  . CHF (congestive heart failure) ([place]) 06/27/2015  . Fungal dermatitis 06/27/2015  . Edema 06/20/2015  . Increasing shortness of breath 06/20/2015  . Lumbar radiculopathy, acute 03/09/2015  . Essential hypertension 12/27/2014  . Uncontrolled type 2 diabetes mellitus with hyperglycemia, with long-term current use of insulin ([place]) 12/27/2014  . Mild aortic stenosis 06/28/2014  . Memory loss 04/29/2014  . Tinnitus of both ears 02/17/2014  . Wellness examination 12/04/2013  . Constipation due to slow transit 08/18/2013  . S/P left TKA 06/22/2013  . Meniere disease 04/21/2013  . Benign paroxysmal positional vertigo 04/21/2013  . History of methicillin resistant staphylococcus aureus (MRSA) 2015  . Psoriatic arthritis ([place]) 05/14/2012  . General medical exam 01/28/2012  . Cellulitis of eyelid 09/25/2011  . Diverticulitis 06/20/2011  . Erythrocytosis 05/18/2011  . S/P TKR (total knee replacement) 12/31/2010  . Obesity, Class III, BMI 40-49.9 (morbid obesity) ([place]) 12/31/2010  . Hyperlipidemia 12/22/2010  . BPH (benign prostatic hyperplasia) 12/22/2010  . Gout 12/22/2010  . Obstructive sleep apnea 04/04/2009   PCP:  [name], MD Pharmacy:   [facility] [address] [place] [address] Phone: [phone] Fax: [phone]     Social Determinants of Health (SDOH) Interventions    Readmission Risk Interventions No flowsheet data found.

## 2019-11-13 NOTE — Discharge Instructions (Signed)
ANORECTAL SURGERY:  POST OPERATIVE INSTRUCTIONS  ######################################################################  EAT Start with a pureed / full liquid diet After 24 hours, gradually transition to a high fiber diet.    CONTROL PAIN Control pain so you can tolerate bowel movements,  walk, sleep, tolerate sneezing/coughing, and go up/down stairs.   HAVE A BOWEL MOVEMENT DAILY Keep your bowels regular to avoid problems.   Taking a fiber supplement every day to keep bowels soft.   Try a laxative to override constipation. Use an antidairrheal to slow down diarrhea.   Call if not better after 2 tries  WALK Walk an hour a day.  Control your pain to do that.   CALL IF YOU HAVE PROBLEMS/CONCERNS Call if you are still struggling despite following these instructions. Call if you have concerns not answered by these instructions  ######################################################################    1. Take your usually prescribed home medications unless otherwise directed.  2. DIET: Follow a light bland diet & liquids the first 24 hours after arrival home, such as soup, liquids, starches, etc.  Be sure to drink plenty of fluids.  Quickly advance to a usual solid diet within a few days.  Avoid fast food or heavy meals as your are more likely to get nauseated or have irregular bowels.  A low-fat, high-fiber diet for the rest of your life is ideal.  3. PAIN CONTROL: a. Pain is best controlled by a usual combination of three different methods TOGETHER: i. Ice/Heat ii. Over the counter pain medication iii. Prescription pain medication b. Expect swelling and discomfort in the anus/rectal area.  Warm water baths (30-60 minutes up to 6 times a day, especially after bowel meovements) will help. Use ice for the first few days to help decrease swelling and bruising, then switch to heat such as warm towels, sitz baths, warm baths, etc to help relax tight/sore spots and speed recovery.   Some people prefer to use ice alone, heat alone, alternating between ice & heat.  Experiment to what works for you.   c. It is helpful to take an over-the-counter pain medication continuously for the first few weeks.  Choose one of the following that works best for you: i. Naproxen (Aleve, etc)  Two 250m tabs twice a day ii. Ibuprofen (Advil, etc) Three 2072mtabs four times a day (every meal & bedtime) iii. Acetaminophen (Tylenol, etc) 500-65044mour times a day (every meal & bedtime) d. A  prescription for pain medication (such as oxycodone, hydrocodone, etc) should be given to you upon discharge.  Take your pain medication as prescribed.  i. If you are having problems/concerns with the prescription medicine (does not control pain, nausea, vomiting, rash, itching, etc), please call us [phone] see if we need to switch you to a different pain medicine that will work better for you and/or control your side effect better. ii. If you need a refill on your pain medication, please contact your pharmacy.  They will contact our office to request authorization. Prescriptions will not be filled after 5 pm or on week-ends.  If can take up to 48 hours for it to be filled & ready so avoid waiting until you are down to thel ast pill. e. A topical cream (Dibucaine) or a prescription for a cream (such as diltiazem 2% gel) may be given to you.  Many people find relief with topical creams.  Some people find it burns too much.  Experiment.  If it helps, use it.  If it burns, don't using  it.  Use a Sitz Bath 4-8 times a day for relief   [employer] A sitz bath is a warm water bath taken in the sitting position that covers only the hips and buttocks. It may be used for either healing or hygiene purposes. Sitz baths are also used to relieve pain, itching, or muscle spasms. The water may contain medicine. Moist heat will help you heal and relax.  HOME CARE INSTRUCTIONS  Take 3 to 4 sitz baths a day. 1. Fill the  bathtub half full with warm water. 2. Sit in the water and open the drain a little. 3. Turn on the warm water to keep the tub half full. Keep the water running constantly. 4. Soak in the water for 15 to 20 minutes. 5. After the sitz bath, pat the affected area dry first.   4. KEEP YOUR BOWELS REGULAR a. The goal is one soft bowel movement a day b. Avoid getting constipated.  Between the surgery and the pain medications, it is common to experience some constipation.  Increasing fluid intake and taking a fiber supplement (such as Metamucil, Citrucel, FiberCon, MiraLax, etc) 2-3 times a day regularly will usually help prevent this problem from occurring.  A mild laxative (prune juice, Milk of Magnesia, MiraLax, etc) should be taken according to package directions if there are no bowel movements after 48 hours. c. Watch out for diarrhea.  If you have many loose bowel movements, simplify your diet to bland foods & liquids for a few days.  Stop any stool softeners and decrease your fiber supplement.  Switching to mild anti-diarrheal medications (Kayopectate, Pepto Bismol) can help.  Can try an imodium/loperamide dose.  If this worsens or does not improve, please call us.  5. Wound Care  a. Remove your bandages with your first bowel movement, usually the day after surgery.  Let the gauze fall off with the first bowel movement or shower.   b. Wear an absorbent pad or soft cotton balls in your underwear as needed to catch any drainage and help keep the area  c. Keep the area clean and dry.  Bathe / shower every day.  Keep the area clean by showering / bathing over the incision / wound.   It is okay to soak an open wound to help wash it.  Consider using a squeeze bottle filled with warm water to gently wash the anal area.  Wet wipes or showers / gentle washing after bowel movements is often less traumatic than regular toilet paper. d. Dennis Bast will often notice bleeding with bowel movements.  This should slow down  by the end of the first week of surgery.  Sitting on an ice pack can help. e. Expect some drainage.  This should slow down by the end of the first week of surgery, but you will have occasional bleeding or drainage up to a few months after surgery.  Wear an absorbent pad or soft cotton gauze in your underwear until the drainage stops.  6. ACTIVITIES as tolerated:   a. You may resume regular (light) daily activities beginning the next day--such as daily self-care, walking, climbing stairs--gradually increasing activities as tolerated.  If you can walk 30 minutes without difficulty, it is safe to try more intense activity such as jogging, treadmill, bicycling, low-impact aerobics, swimming, etc. b. Save the most intensive and strenuous activity for last such as sit-ups, heavy lifting, contact sports, etc  Refrain from any heavy lifting or straining until you are off narcotics for  pain control.   c. DO NOT PUSH THROUGH PAIN.  Let pain be your guide: If it hurts to do something, don't do it.  Pain is your body warning you to avoid that activity for another week until the pain goes down. d. You may drive when you are no longer taking prescription pain medication, you can comfortably sit for long periods of time, and you can safely maneuver your car and apply brakes. e. Dennis Bast may have sexual intercourse when it is comfortable.  7. FOLLOW UP in our office a. Please call [facility] at [phone] to set up an appointment to see your surgeon in the office for a follow-up appointment approximately 2-3 weeks after your surgery. b. Make sure that you call for this appointment the day you arrive home to ensure a convenient appointment time.  8. IF YOU HAVE DISABILITY OR FAMILY LEAVE FORMS, BRING THEM TO THE OFFICE FOR PROCESSING.  DO NOT GIVE THEM TO YOUR DOCTOR.        WHEN TO CALL us [phone]: 1. Poor pain control 2. Reactions / problems with new medications (rash/itching, nausea, etc)  3. Fever over  101.5 F (38.5 C) 4. Inability to urinate 5. Nausea and/or vomiting 6. Worsening swelling or bruising 7. Continued bleeding from incision. 8. Increased pain, redness, or drainage from the incision  The clinic staff is available to answer your questions during regular business hours (8:30am-5pm).  Please don't hesitate to call and ask to speak to one of our nurses for clinical concerns.   A surgeon from [facility] is always on call at the hospitals   If you have a medical emergency, go to the nearest emergency room or call 911.    [facility], [place], [place], [place], [place]  [address] ? MAIN: [phone] ? TOLL FREE: [phone] ? FAX [phone] [email or web address] _________________________________________________________________  Information on my medicine - ELIQUIS (apixaban)  This medication education was reviewed with me or my healthcare representative as part of my discharge preparation.    Why was Eliquis prescribed for you? Eliquis was prescribed for you to reduce the risk of a blood clot forming that can cause a stroke if you have a medical condition called atrial fibrillation (a type of irregular heartbeat).  What do You need to know about Eliquis ? Take your Eliquis TWICE DAILY - one tablet in the morning and one tablet in the evening with or without food. If you have difficulty swallowing the tablet whole please discuss with your pharmacist how to take the medication safely.  Take Eliquis exactly as prescribed by your doctor and DO NOT stop taking Eliquis without talking to the doctor who prescribed the medication.  Stopping may increase your risk of developing a stroke.  Refill your prescription before you run out.  After discharge, you should have regular check-up appointments with your healthcare provider that is prescribing your Eliquis.  In the future your dose may need to be changed if your  kidney function or weight changes by a significant amount or as you get older.  What do you do if you miss a dose? If you miss a dose, take it as soon as you remember on the same day and resume taking twice daily.  Do not take more than one dose of ELIQUIS at the same time to make up a missed dose.  Important Safety Information A possible side effect of Eliquis is bleeding. You should call your healthcare  provider right away if you experience any of the following: ? Bleeding from an injury or your nose that does not stop. ? Unusual colored urine (red or dark brown) or unusual colored stools (red or black). ? Unusual bruising for unknown reasons. ? A serious fall or if you hit your head (even if there is no bleeding).  Some medicines may interact with Eliquis and might increase your risk of bleeding or clotting while on Eliquis. To help avoid this, consult your healthcare provider or pharmacist prior to using any new prescription or non-prescription medications, including herbals, vitamins, non-steroidal anti-inflammatory drugs (NSAIDs) and supplements.  This website has more information on Eliquis (apixaban): http://[email or web address].com/eliquis/home

## 2019-11-13 NOTE — Progress Notes (Signed)
Physical Therapy Treatment Patient Details Name: Gary Butler MRN: [id number] DOB: 09/10/1945 Today's Date: 11/13/2019    History of Present Illness Pt s/p I&D of scrotal abscess and with hx of DM, Psoriatic Arthritis, Bil TKR, early dementia and chronic back pain    PT Comments    Assisted OOB.  General bed mobility comments: assist with upper body with supine to sit then assist B LE up onto bed with sit to supine.  General transfer comment: requires an elevated surface also assisted with a toilet transfer  General Gait Details: pt only amb to and from bathroom this session due to increased pain and fatigue this morning.  Assisted back to bed per pt request to rest.   Follow Up Recommendations  Home health PT     Equipment Recommendations  None recommended by PT    Recommendations for Other Services       Precautions / Restrictions Precautions Precautions: Fall    Mobility  Bed Mobility Overal bed mobility: Needs Assistance Bed Mobility: Supine to Sit;Sit to Supine     Supine to sit: Min assist Sit to supine: Min assist   General bed mobility comments: assist with upper body with supine to sit then assist B LE up onto bed with sit to supine  Transfers Overall transfer level: Needs assistance Equipment used: Rolling walker (2 wheeled) Transfers: Sit to/from [employer] Sit to Stand: Supervision Stand pivot transfers: Supervision;Min guard       General transfer comment: requires an elevated surface also assisted with a toilet transfer  Ambulation/Gait Ambulation/Gait assistance: Supervision Gait Distance (Feet): 22 Feet (ta and from bathroon) Assistive device: Rolling walker (2 wheeled) (Bariatric) Gait Pattern/deviations: Step-through pattern;Decreased step length - right;Decreased step length - left;Shuffle;Trunk flexed Gait velocity: decreased   General Gait Details: pt only amb to and from bathroom this session due to increased pain and fatigue  this morning   Stairs             Wheelchair Mobility    Modified Rankin (Stroke Patients Only)       Balance                                            Cognition Arousal/Alertness: Awake/alert Behavior During Therapy: WFL for tasks assessed/performed Overall Cognitive Status: Within Functional Limits for tasks assessed                                 General Comments: AxO x 3 pleasant      Exercises      General Comments        Pertinent Vitals/Pain Pain Assessment: 0-10 Pain Score: 7  Pain Location: scrotal area at I@D  site Pain Descriptors / Indicators: Grimacing;Tender Pain Intervention(s): Monitored during session    Home Living                      Prior Function            PT Goals (current goals can now be found in the care plan section) Progress towards PT goals: Progressing toward goals    Frequency    Min 3X/week      PT Plan Current plan remains appropriate    Co-evaluation  AM-PAC PT "6 Clicks" Mobility   Outcome Measure  Help needed turning from your back to your side while in a flat bed without using bedrails?: A Little Help needed moving from lying on your back to sitting on the side of a flat bed without using bedrails?: A Little Help needed moving to and from a bed to a chair (including a wheelchair)?: A Little Help needed standing up from a chair using your arms (e.g., wheelchair or bedside chair)?: A Little Help needed to walk in hospital room?: A Little Help needed climbing 3-5 steps with a railing? : A Little 6 Click Score: 18    End of Session Equipment Utilized During Treatment: Gait belt Activity Tolerance: Patient tolerated treatment well Patient left: in bed;with call bell/phone within reach Nurse Communication: Mobility status PT Visit Diagnosis: Unsteadiness on feet (R26.81);Difficulty in walking, not elsewhere classified (R26.2)     Time:  6294-7654 PT Time Calculation (min) (ACUTE ONLY): 25 min  Charges:  $Gait Training: 8-22 mins $Therapeutic Activity: 8-22 mins                     Rica Koyanagi  PTA Acute  Rehabilitation Services Pager      [phone] Office      [phone]

## 2019-11-14 DIAGNOSIS — K611 Rectal abscess: Secondary | ICD-10-CM | POA: Diagnosis not present

## 2019-11-14 LAB — CULTURE, BLOOD (ROUTINE X 2)
Culture: NO GROWTH
Culture: NO GROWTH
Special Requests: ADEQUATE

## 2019-11-14 LAB — GLUCOSE, CAPILLARY
Glucose-Capillary: 127 mg/dL — ABNORMAL HIGH (ref 70–99)
Glucose-Capillary: 138 mg/dL — ABNORMAL HIGH (ref 70–99)
Glucose-Capillary: 127 mg/dL — ABNORMAL HIGH (ref 70–99)
Glucose-Capillary: 148 mg/dL — ABNORMAL HIGH (ref 70–99)

## 2019-11-14 MED ORDER — METRONIDAZOLE 500 MG PO TABS: 500.0000 mg | ORAL_TABLET | Freq: Three times a day (TID) | ORAL | 1 refills | Status: DC

## 2019-11-14 MED ORDER — CHLORHEXIDINE GLUCONATE CLOTH 2 % EX PADS
6.0000 | MEDICATED_PAD | Freq: Every day | CUTANEOUS | Status: DC
  Administered 2019-11-14 – 2019-11-17 (×3): 6 via TOPICAL

## 2019-11-14 MED ORDER — NYSTATIN 100000 UNIT/GM EX POWD
Freq: Two times a day (BID) | CUTANEOUS | Status: DC
  Filled 2019-11-14: qty 15

## 2019-11-14 MED ORDER — CEPHALEXIN 500 MG PO CAPS: 1000.0000 mg | ORAL_CAPSULE | Freq: Three times a day (TID) | ORAL | 2 refills | Status: DC

## 2019-11-14 MED ORDER — CEPHALEXIN 500 MG PO CAPS
1000.0000 mg | ORAL_CAPSULE | Freq: Three times a day (TID) | ORAL | Status: DC
  Administered 2019-11-14 – 2019-11-17 (×10): 1000 mg via ORAL
  Filled 2019-11-14 (×11): qty 2

## 2019-11-14 MED ORDER — BISOPROLOL FUMARATE 5 MG PO TABS
2.5000 mg | ORAL_TABLET | Freq: Every day | ORAL | Status: DC
  Administered 2019-11-14 – 2019-11-17 (×4): 2.5 mg via ORAL
  Filled 2019-11-14 (×4): qty 1

## 2019-11-14 NOTE — Progress Notes (Signed)
Physical Therapy Treatment Patient Details Name: Gary Butler MRN: [id number] DOB: 1945/04/29 Today's Date: 11/14/2019    History of Present Illness Pt s/p I&D of scrotal abscess and with hx of DM, Psoriatic Arthritis, Bil TKR, early dementia and chronic back pain    PT Comments    Pt making good progress with PT, tolerating hallway distance ambulation with use of RW. Pt also tolerated repeated mini-squats for LE strengthening, with increased time and effort to perform. Pt complaining of perirectal pain, RN notified. PT to continue to follow acutely.     Follow Up Recommendations  Home health PT     Equipment Recommendations  None recommended by PT    Recommendations for Other Services       Precautions / Restrictions Precautions Precautions: Fall Restrictions Weight Bearing Restrictions: No    Mobility  Bed Mobility Overal bed mobility: Needs Assistance Bed Mobility: Supine to Sit;Sit to Supine     Supine to sit: Min assist Sit to supine: Min assist   General bed mobility comments: min assist for LE and truncal elevation/lowering, very increased time with HOB elevated to 45*.  Transfers Overall transfer level: Needs assistance Equipment used: Rolling walker (2 wheeled) Transfers: Sit to/from Stand Sit to Stand: Min guard         General transfer comment: min guard for safety, elevated bed height per pt request.  Ambulation/Gait Ambulation/Gait assistance: Supervision Gait Distance (Feet): 310 Feet Assistive device: Rolling walker (2 wheeled) Gait Pattern/deviations: Step-through pattern;Shuffle;Trunk flexed;Decreased stride length Gait velocity: decr   General Gait Details: Supervision for safety, verbal cuing for upright posture, placement in RW. Standing rest breaks x3 for DOE 2/4, SpO2 95% immediately post-ambulation   Stairs             Wheelchair Mobility    Modified Rankin (Stroke Patients Only)       Balance Overall balance  assessment: Needs assistance Sitting-balance support: No upper extremity supported;Feet supported Sitting balance-Leahy Scale: Good     Standing balance support: No upper extremity supported Standing balance-Leahy Scale: Fair                              Cognition Arousal/Alertness: Awake/alert Behavior During Therapy: WFL for tasks assessed/performed Overall Cognitive Status: Within Functional Limits for tasks assessed                                 General Comments: pleasant and motivated      Exercises General Exercises - Lower Extremity Mini-Sqauts: AROM;5 reps;Both    General Comments        Pertinent Vitals/Pain Pain Assessment: Faces Faces Pain Scale: Hurts little more Pain Location: rectum Pain Descriptors / Indicators: Grimacing;Tender Pain Intervention(s): Limited activity within patient's tolerance;Monitored during session;Repositioned    Home Living                      Prior Function            PT Goals (current goals can now be found in the care plan section) Acute Rehab PT Goals Patient Stated Goal: Regain IND PT Goal Formulation: With patient Time For Goal Achievement: 11/26/19 Potential to Achieve Goals: Good Progress towards PT goals: Progressing toward goals    Frequency    Min 3X/week      PT Plan Current plan remains appropriate    Co-evaluation  AM-PAC PT "6 Clicks" Mobility   Outcome Measure  Help needed turning from your back to your side while in a flat bed without using bedrails?: A Little Help needed moving from lying on your back to sitting on the side of a flat bed without using bedrails?: A Little Help needed moving to and from a bed to a chair (including a wheelchair)?: A Little Help needed standing up from a chair using your arms (e.g., wheelchair or bedside chair)?: A Little Help needed to walk in hospital room?: A Little Help needed climbing 3-5 steps with a  railing? : A Little 6 Click Score: 18    End of Session Equipment Utilized During Treatment: Gait belt Activity Tolerance: Patient tolerated treatment well Patient left: in bed;with call bell/phone within reach Nurse Communication: Mobility status PT Visit Diagnosis: Unsteadiness on feet (R26.81);Difficulty in walking, not elsewhere classified (R26.2)     Time: 7356-7014 PT Time Calculation (min) (ACUTE ONLY): 26 min  Charges:  $Gait Training: 8-22 mins $Therapeutic Activity: 8-22 mins                    Dajahnae Vondra E, PT Acute Rehabilitation Services Pager [phone]  Office [phone]    Phenix Grein D Francile Woolford 11/14/2019, 1:00 PM

## 2019-11-14 NOTE — Progress Notes (Signed)
Paged [facility] Coverage Dr. Hollace Hayward to inform about patient not voiding.  New order at 0411: In and Out cath

## 2019-11-14 NOTE — Plan of Care (Signed)
  Problem: Education: Goal: Knowledge of General Education information will improve Description Including pain rating scale, medication(s)/side effects and non-pharmacologic comfort measures Outcome: Progressing   

## 2019-11-14 NOTE — Progress Notes (Signed)
Paged [facility] Dr. Hollace Hayward at 2 am (11/14/19) about patient not voiding (urine output less than 25 cc since the foley was removed at 4:45 PM 11/13/19).  Response from Dr. Hollace Hayward at [place] on 11/14/19: Re-bladder scan patient at 4 am and call back if there is still no output/bladder scan shows a lot of retention.

## 2019-11-14 NOTE — Progress Notes (Signed)
PROGRESS NOTE  Gary Butler BJS:[id number] DOB: 04-07-1945 DOA: 11/09/2019 PCP: [name], MD  HPI/Recap of past 24 hours: HPI: 74 year old male with past medical history of OSA on BIPAP, obesity, chronic dCHF, hyperlipidemia, hypertension, diabetes mellitus type 2, mild cognitive impairment, gastroesophageal reflux disease and pertinent past medical history of a left-sided perianal fistula status post fistulotomy performed at [facility] in 2019. Patient presented to [facility] emergency department with complaints of frequent bouts of watery diarrhea and rectal pain.  Patient explains that for at least the past 6 weeks he has been experiencing what he describes as explosive bouts of watery diarrhea.  Associated with progressively worsening rectal discomfort and frequent bouts of vomiting and inability to tolerate oral intake for the past several days.  Underwent recent EGD approximately 1 week prior to presentation with esophageal dilation.   Presented to [facility] emergency department at the direction of his primary care provider.  At [facility],  CT abdomen and pelvis revealed evidence of abscess formation of 3.4 x 2.5 cm just below the base of the penis. There was initially concern for Fournier's gangrene and therefore after discussion with urology and general surgery patient was transferred to the [facility] long ED for further evaluation and management.   Work up revealed perineal abscess without Fournier's. Patient was taken to the operating room at [facility] long by Dr. Johney Maine for an incision and drainage. TRH was asked to admit.  11/14/19: Unable to void this morning.  In and out cath with 900 cc of urine output.  Was started on Flomax, continue.  Seen by general surgery, appreciate recommendations.   Assessment/Plan: Principal Problem:   Suprasphincteric perirectal abscess s/p I&D 11/10/2019 Active Problems:   Obstructive sleep apnea   Essential  hypertension   Uncontrolled type 2 diabetes mellitus with hyperglycemia, with long-term current use of insulin (HCC)   Acute urinary retention s/p Foley 11/10/2019   Diarrhea   Perineal abscess   GERD without esophagitis   Atrial fibrillation (HCC)  Sepsis, improving, 2/2 Suprasphincteric perirectal abscess s/p I&D 11/10/2019 by Dr. Johney Maine, E-coli UTI  Patient is status post successful incision and drainage by Dr. Johney Maine  Surgery following and assisting with education on dressing changes  Wound culture growing rare e-coli and rare viridans streptococcus, culture reincubated for better growth.  Switch Rocephin and IV flagyl to keflex and po flagyl on 11/13/2019.  Patient has a history of a perianal fistula requiring fistulotomy in 2019 at [facility]. The appearance of the abscess is concerning for the development of another fistula.  Blood cultures no growth to date  Pain control.  We appreciate surgery's assistance  Acute urinary retention Foley catheter was removed on 11/13/2019, unable to void, in and out cath revealed 900 cc urine output. Foley catheter placed, patient will need to follow-up with urology outpatient with voiding trial in the office Continue Flomax  New onset atrial fibrillation  Patient noted to be in atrial fibrillation on 11/09/19, in the setting of sepsis.  TSH normal 1.1, troponin within normal limit 15  2D echo done on 11/11/2019 showed LVEF 60 to 65% with grade 2 diastolic dysfunction  Chadsvasc score of 3  Cardiology consulted to advise on [facility]  He is on home Bisoprolol, this has controlled his rate  Added telemetry monitoring  Started DOAC Eliquis on 9/30  Will follow up with Afib clinic/cardiology outpatient  E. coli UTI Urine culture growing greater than 100,000 colonies of E.  coli with resistance to ampicillin, intermediate resistance to ampicillin/sulbactam, ciprofloxacin, and Bactrim.  Severe lower extremity tenderness with palpation.   Ruled out DVT with B/L doppler US    Acute urinary retention, post-op, s/p Foley placement on 11/10/2019 No hydronephrosis or perinephric edema on CT scan abdomen and pelvis with contrast done on 11/09/2019. Patient exhibited acute urinary retention postop in the PACU, Foley catheter placed Foley removed 11/13/19, patient receptive.  History of gout On allopurinol.    Diarrhea, improving  Patient reports 6 to 8-week history of diarrhea, etiology unclear  Had stool studies performed by his primary care provider a week or so ago that were negative for C. Difficile  No enteric precautions required  Review of medications reveals the patient is on atorvastatin which can frequently cause diarrhea, this was discontinued  Continue to monitor    Essential hypertension BPs are soft Currently on bisoprolol 5 mg daily  Maxzide held to avoid hypotension Continue to monitor vital signs    Uncontrolled type 2 diabetes mellitus with hyperglycemia,  Continue to hold off home oral hypoglycemics  Recent hemoglobin A1c was found to be elevated at 9.1 on 10/26/19  Continue SSI scale.  Severe morbid obesity BMI 42 Recommend weight loss outpatient with regular physical activity and healthy dieting Will benefit from bariatric surgery for weight loss evaluation  Physical debility PT recommended home health PT OT Fall precautions.        Code Status:  Full code Family Communication:  None at bedside.    Consultants:  General surgery  Cardiology  Procedures:  Post I&D  Antimicrobials:  IV vancomycin, completed  Cefepime, completed  IV Flagyl, completed  Keflex, po flagyl 11/13/19  DVT prophylaxis: Subcu Lovenox daily  Status is: Inpatient    Dispo: The patient is from: Home              Anticipated d/c is to: Home with home health services versus SNF pending PT evaluation.              Anticipated d/c date is: 11/15/2019.               Patient  currently not stable for discharge, ongoing management of perirectal wound.         Objective: Vitals:   11/14/19 0117 11/14/19 0200 11/14/19 0335 11/14/19 1313  BP: (!) 101/58  100/66 (!) 109/54  Pulse: 62  67 67  Resp:  18 15 (!) 25  Temp:   98 F (36.7 C) 97.7 F (36.5 C)  TempSrc:   Oral Oral  SpO2:   96% 94%  Weight:      Height:        Intake/Output Summary (Last 24 hours) at 11/14/2019 1653 Last data filed at 11/14/2019 1045 Gross per 24 hour  Intake 692.05 ml  Output 900 ml  Net -207.95 ml   Filed Weights   11/10/19 0835  Weight: (!) 137.4 kg    Exam:  . General: 74 y.o. year-old male obese, pleasant in no acute distress.  Alert oriented x3. . Cardiovascular: Regular rate and rhythm no rubs or gallops. Marland Kitchen Respiratory: Clear to auscultation no wheezes or rales.   . Abdomen: Obese nontender normal bowel sounds present.  Nontender. . Musculoskeletal: Trace lower extremity edema bilaterally.  Bilateral knee scars noted. Marland Kitchen Psychiatry: Mood is appropriate for condition and setting.  Data Reviewed: CBC: Recent Labs  Lab 11/09/19 1833 11/10/19 0958 11/11/19 0647 11/12/19 0605 11/13/19 0549  WBC 16.7* 16.3* 15.1* 10.3  7.9  NEUTROABS 15.3* 15.3* 13.5* 8.8* 6.2  HGB 16.3 14.3 13.7 14.4 14.3  HCT 46.7 42.3 40.9 43.7 41.6  MCV 90.9 93.8 94.7 95.0 92.0  PLT 277 223 211 235 517   Basic Metabolic Panel: Recent Labs  Lab 11/09/19 1833 11/10/19 0958 11/11/19 0647 11/12/19 0605 11/13/19 0549  NA 132* 130* 133* 132* 132*  K 3.3* 3.3* 3.4* 3.8 4.0  CL 94* 96* 101 101 103  CO2 21* 23 21* 21* 16*  GLUCOSE 259* 231* 165* 133* 127*  BUN 19 18 19 18 15   CREATININE 1.50* 1.22 1.14 1.09 0.98  CALCIUM 9.3 8.7* 8.4* 8.8* 8.3*  MG  --  1.8  --  1.9 1.8   GFR: Estimated Creatinine Clearance: 93.6 mL/min (by C-G formula based on SCr of 0.98 mg/dL). Liver Function Tests: Recent Labs  Lab 11/09/19 1833 11/10/19 0958 11/11/19 0647  AST 25 16 16   ALT 15 14 12    ALKPHOS 92 74 70  BILITOT 1.4* 1.4* 1.2  PROT 6.8 5.8* 5.4*  ALBUMIN 3.3* 2.7* 2.6*   Recent Labs  Lab 11/09/19 1833  LIPASE 22   No results for input(s): AMMONIA in the last 168 hours. Coagulation Profile: No results for input(s): INR, PROTIME in the last 168 hours. Cardiac Enzymes: No results for input(s): CKTOTAL, CKMB, CKMBINDEX, TROPONINI in the last 168 hours. BNP (last 3 results) No results for input(s): PROBNP in the last 8760 hours. HbA1C: No results for input(s): HGBA1C in the last 72 hours. CBG: Recent Labs  Lab 11/13/19 1749 11/13/19 2223 11/14/19 0742 11/14/19 1140 11/14/19 1651  GLUCAP 214* 169* 127* 138* 127*   Lipid Profile: No results for input(s): CHOL, HDL, LDLCALC, TRIG, CHOLHDL, LDLDIRECT in the last 72 hours. Thyroid Function Tests: No results for input(s): TSH, T4TOTAL, FREET4, T3FREE, THYROIDAB in the last 72 hours. Anemia Panel: No results for input(s): VITAMINB12, FOLATE, FERRITIN, TIBC, IRON, RETICCTPCT in the last 72 hours. Urine analysis:    Component Value Date/Time   COLORURINE YELLOW 11/09/2019 1937   APPEARANCEUR CLEAR 11/09/2019 1937   LABSPEC <1.005 (L) 11/09/2019 1937   PHURINE 5.5 11/09/2019 1937   GLUCOSEU >=500 (A) 11/09/2019 1937   HGBUR NEGATIVE 11/09/2019 1937   BILIRUBINUR NEGATIVE 11/09/2019 1937   KETONESUR NEGATIVE 11/09/2019 1937   PROTEINUR NEGATIVE 11/09/2019 1937   UROBILINOGEN 1.0 06/10/2013 1000   NITRITE NEGATIVE 11/09/2019 1937   LEUKOCYTESUR NEGATIVE 11/09/2019 1937   Sepsis Labs: @LABRCNTIP (procalcitonin:4,lacticidven:4)  ) Recent Results (from the past 240 hour(s))  Respiratory Panel by RT PCR (Flu A&B, Covid) - Nasopharyngeal Swab     Status: None   Collection Time: 11/09/19  7:37 PM   Specimen: Nasopharyngeal Swab  Result Value Ref Range Status   SARS Coronavirus 2 by RT PCR NEGATIVE NEGATIVE Final    Comment: (NOTE) SARS-CoV-2 target nucleic acids are NOT DETECTED.  The SARS-CoV-2 RNA is  generally detectable in upper respiratoy specimens during the acute phase of infection. The lowest concentration of SARS-CoV-2 viral copies this assay can detect is 131 copies/mL. A negative result does not preclude SARS-Cov-2 infection and should not be used as the sole basis for treatment or other patient management decisions. A negative result may occur with  improper specimen collection/handling, submission of specimen other than nasopharyngeal swab, presence of viral mutation(s) within the areas targeted by this assay, and inadequate number of viral copies (<131 copies/mL). A negative result must be combined with clinical observations, patient history, and epidemiological information. The expected result is  Negative.  Fact Sheet for Patients:  PinkCheek.be  Fact Sheet for Healthcare Providers:  GravelBags.it  This test is no t yet approved or cleared by the [place] FDA and  has been authorized for detection and/or diagnosis of SARS-CoV-2 by FDA under an Emergency Use Authorization (EUA). This EUA will remain  in effect (meaning this test can be used) for the duration of the COVID-19 declaration under Section 564(b)(1) of the Act, 21 U.S.C. section 360bbb-3(b)(1), unless the authorization is terminated or revoked sooner.     Influenza A by PCR NEGATIVE NEGATIVE Final   Influenza B by PCR NEGATIVE NEGATIVE Final    Comment: (NOTE) The Xpert Xpress SARS-CoV-2/FLU/RSV assay is intended as an aid in  the diagnosis of influenza from Nasopharyngeal swab specimens and  should not be used as a sole basis for treatment. Nasal washings and  aspirates are unacceptable for Xpert Xpress SARS-CoV-2/FLU/RSV  testing.  Fact Sheet for Patients: PinkCheek.be  Fact Sheet for Healthcare Providers: GravelBags.it  This test is not yet approved or cleared by the [place] FDA and  has been authorized for detection and/or diagnosis of SARS-CoV-2 by  FDA under an Emergency Use Authorization (EUA). This EUA will remain  in effect (meaning this test can be used) for the duration of the  Covid-19 declaration under Section 564(b)(1) of the Act, 21  U.S.C. section 360bbb-3(b)(1), unless the authorization is  terminated or revoked. Performed at [facility], [address] [place], [place] [address]   Urine culture     Status: Abnormal   Collection Time: 11/09/19  7:37 PM   Specimen: Urine, Random  Result Value Ref Range Status   Specimen Description   Final    URINE, RANDOM Performed at [facility], [address] [place], [place] [address]    Special Requests   Final    NONE Performed at [facility], [address] [place], [place] [address]    Culture >=100,000 COLONIES/mL ESCHERICHIA COLI (A)  Final   Report Status 11/11/2019 FINAL  Final   Organism ID, Bacteria ESCHERICHIA COLI (A)  Final      Susceptibility   Escherichia coli - MIC*    AMPICILLIN >=32 RESISTANT Resistant     CEFAZOLIN <=4 SENSITIVE Sensitive     CEFTRIAXONE <=0.25 SENSITIVE Sensitive     CIPROFLOXACIN >=4 RESISTANT Resistant     GENTAMICIN <=1 SENSITIVE Sensitive     IMIPENEM <=0.25 SENSITIVE Sensitive     NITROFURANTOIN <=16 SENSITIVE Sensitive     TRIMETH/SULFA >=320 RESISTANT Resistant     AMPICILLIN/SULBACTAM 16 INTERMEDIATE Intermediate     PIP/TAZO <=4 SENSITIVE Sensitive     * >=100,000 COLONIES/mL ESCHERICHIA COLI  Culture, blood (routine x 2)     Status: None   Collection Time: 11/09/19  8:01 PM   Specimen: BLOOD RIGHT HAND  Result Value Ref Range Status   Specimen Description   Final    BLOOD RIGHT HAND Performed at [facility], [address] [place], [place] [address]    Special Requests   Final    BOTTLES DRAWN AEROBIC AND ANAEROBIC Blood Culture results may not be optimal due to an inadequate volume of  blood received in culture bottles Performed at [facility], [address] [place], [place] [address]    Culture   Final    NO GROWTH 5 DAYS Performed at [facility], [place]  [address] [place], [place] [address]    Report Status 11/14/2019 FINAL  Final  Culture, blood (routine x 2)     Status: None   Collection Time: 11/09/19  8:10 PM   Specimen: Right Antecubital; Blood  Result Value Ref Range Status   Specimen Description   Final    RIGHT ANTECUBITAL Performed at [facility], [address] [place], [place] [address]    Special Requests   Final    BOTTLES DRAWN AEROBIC AND ANAEROBIC Blood Culture adequate volume Performed at [facility], [address], [place], [place] [address]    Culture   Final    NO GROWTH 5 DAYS Performed at [facility], [place] [address] [place], [place] [address]    Report Status 11/14/2019 FINAL  Final  Aerobic/Anaerobic Culture (surgical/deep wound)     Status: None   Collection Time: 11/10/19  3:30 AM   Specimen: Abscess  Result Value Ref Range Status   Specimen Description   Final    ABSCESS Performed at [place] [address], [place], [place] [address]    Special Requests   Final    NONE Performed at [facility], [place] [address] [place], [place] [address]    Gram Stain   Final    FEW WBC SEEN FEW GRAM POSITIVE COCCI Performed at [facility], [place] [address] [place], [place] [address]    Culture   Final    RARE ESCHERICHIA COLI RARE VIRIDANS STREPTOCOCCUS RARE BACTEROIDES FRAGILIS RARE BACTEROIDES OVATUS BETA LACTAMASE POSITIVE Performed at [facility], [place] [address], [place], [place] [address]    Report Status 11/13/2019 FINAL  Final   Organism ID, Bacteria ESCHERICHIA COLI  Final   Organism ID, Bacteria VIRIDANS STREPTOCOCCUS  Final      Susceptibility   Escherichia coli - MIC*    AMPICILLIN >=32 RESISTANT  Resistant     CEFAZOLIN <=4 SENSITIVE Sensitive     CEFEPIME <=0.12 SENSITIVE Sensitive     CEFTAZIDIME <=1 SENSITIVE Sensitive     CEFTRIAXONE <=0.25 SENSITIVE Sensitive     CIPROFLOXACIN >=4 RESISTANT Resistant     GENTAMICIN <=1 SENSITIVE Sensitive     IMIPENEM <=0.25 SENSITIVE Sensitive     TRIMETH/SULFA >=320 RESISTANT Resistant     AMPICILLIN/SULBACTAM 16 INTERMEDIATE Intermediate     PIP/TAZO <=4 SENSITIVE Sensitive     * RARE ESCHERICHIA COLI   Viridans streptococcus - MIC*    PENICILLIN <=0.06 SENSITIVE Sensitive     CEFTRIAXONE 0.25 SENSITIVE Sensitive     ERYTHROMYCIN <=0.12 SENSITIVE Sensitive     LEVOFLOXACIN 0.5 SENSITIVE Sensitive     VANCOMYCIN 0.5 SENSITIVE Sensitive     * RARE VIRIDANS STREPTOCOCCUS  MRSA PCR Screening     Status: None   Collection Time: 11/10/19  4:00 AM   Specimen: Nasopharyngeal  Result Value Ref Range Status   MRSA by PCR NEGATIVE NEGATIVE Final    Comment:        The GeneXpert MRSA Assay (FDA approved for NASAL specimens only), is one component of a comprehensive MRSA colonization surveillance program. It is not intended to diagnose MRSA infection nor to guide or monitor treatment for MRSA infections. Performed at [facility], [place] [address] [place], [place] [address]       Studies: No results found.  Scheduled Meds: . acetaminophen  1,000 mg Oral TID  . allopurinol  300 mg  Oral Daily  . apixaban  5 mg Oral BID  . aspirin EC  81 mg Oral Daily  . bisoprolol  2.5 mg Oral Daily  . cephALEXin  1,000 mg Oral Q8H  . cholecalciferol  2,000 Units Oral Daily  . donepezil  10 mg Oral QHS  . feeding supplement ([employer] STANDARD 1.4)  325 mL Oral BID BM  . fluticasone  2 spray Each Nare Daily  . folic acid  1 mg Oral Daily  . insulin aspart  0-20 Units Subcutaneous TID AC & HS  . lip balm  1 application Topical BID  . loratadine  10 mg Oral Daily  . metroNIDAZOLE  500 mg Oral Q8H  . nystatin   Topical BID   . pantoprazole  40 mg Oral Daily  . psyllium  1 packet Oral BID  . sertraline  50 mg Oral Daily  . sodium chloride flush  3 mL Intravenous Q12H  . tamsulosin  0.4 mg Oral QPC supper  . vitamin B-12  2,000 mcg Oral Daily    Continuous Infusions: . sodium chloride 250 mL (11/11/19 1814)  . methocarbamol (ROBAXIN) IV       LOS: 4 days     Kayleen Memos, MD [facility] Pager [phone]  If 7PM-7AM, please contact night-coverage [email or web address] Password [email or web address] 11/14/2019, 4:53 PM

## 2019-11-14 NOTE — Progress Notes (Signed)
Gary Butler [id number] 1945-07-09  CARE TEAM:  PCP: [name], MD  Outpatient Care Team: Patient Care Team: [name], MD as PCP - General (Family Medicine) Troy Sine, MD as PCP - Cardiology (Cardiology) [place], Jennefer Bravo, MD as Referring Physician (Dermatology) Sharyne Peach, MD as Consulting Physician (Ophthalmology) Hennie Duos, MD as Consulting Physician (Rheumatology) Kathie Rhodes, MD as Consulting Physician (Urology) Tanda Rockers, MD as Consulting Physician (Pulmonary Disease) Troy Sine, MD as Consulting Physician (Cardiology) Paralee Cancel, MD as Consulting Physician (Orthopedic Surgery) Cameron Sprang, MD as Consulting Physician (Neurology) Marzetta Board, DPM as Consulting Physician (Podiatry) Renelda Mom, MD as Referring Physician (Colon and Rectal Surgery) Richmond Campbell, MD as Consulting Physician (Gastroenterology) Rolene Course, PA-C as Physician Assistant (Internal Medicine)  Inpatient Treatment Team: Treatment Team: Attending Provider: Kayleen Memos, DO; Consulting Physician: Nolon Nations, MD; Rounding Team: Fatima Blank, MD; Technician: Ladell Heads, NT; Technician: Jeannetta Nap, NT; Utilization Review: Alease Medina, RN; Technician: Cherlyn Labella, NT; Registered Nurse: Celedonio Savage, RN; Physical Therapist: Julien Girt, PT   Problem List:   Principal Problem:   Suprasphincteric perirectal abscess s/p I&D 11/10/2019 Active Problems:   Obstructive sleep apnea   Essential hypertension   Uncontrolled type 2 diabetes mellitus with hyperglycemia, with long-term current use of insulin ([place])   Acute urinary retention s/p Foley 11/10/2019   Diarrhea   Perineal abscess   GERD without esophagitis   Atrial fibrillation ([place])   4 Days Post-Op  11/10/2019  POST-OPERATIVE DIAGNOSIS:  SUPRASPHINCTERIC HIGH PERIRECTAL AABSCESS   PROCEDURE:   INCISION & DRAINAGE OF HIGH PERIRECTAL  ABSCESS ANORECTAL EXAMINATION UNDER ANESTHESIA   SURGEON:  Adin Hector, MD  Assessment/Plan OSA Morbid obesity HTN DM  UTI - e.coli -- per medicine --   POD 3, s/p I&D of suprasphincteric perirectal abscess -WBC remains normal - still copious purulent drainage, patient has not gotten in shower or done sitz baths.  Discussed importance of this with patient again. This should clean his wound up overall. -abx can be switched to oral from IV at discharge.  Options very limited given the organism was resistant to Bactrim and Augmentin as well as intolerance to fluoroquinolones due to questionable allergy.  We will do high-dose Keflex and Flagyl together.  I would do 10 days at discharge with close follow-up -CX: rare E.coli and rare viridians streptococcus, reincubated -Consider dry powders in the region for better hygiene and avoid yeast infection.  Fluconazole to avoid yeast problems.  I believe he was treated earlier.  We will double check -patient is otherwise surgically stable for DC home when felt medically stable for DC -follow up with is being arranged with our office currently.     FEN - carb mod diet VTE - Lovenox ID - rocephin/flagyl  LOS: 3 days     11/14/2019    Subjective: (Chief complaint)  Patient sleeping better with BiPAP.  Still with moderate drainage.  Urination still somewhat challenged but trying to be managed.  No pain with defecation.  Objective:  Vital signs:  Vitals:   11/13/19 2352 11/14/19 0117 11/14/19 0200 11/14/19 0335  BP: (!) 98/57 (!) 101/58  100/66  Pulse: 62 62  67  Resp:   18 15  Temp:    98 F (36.7 C)  TempSrc:    Oral  SpO2:    96%  Weight:      Height:  Last BM Date: 11/13/19  Intake/Output   Yesterday:  10/01 0701 - 10/02 0700 In: 350 [P.O.:60; I.V.:603] Out: 900 [Urine:900] This shift:  No intake/output data recorded.  Bowel function:  Flatus: YES  BM:  YES  Drain: (No drain)   Physical  Exam:  General: Pt awake/alert in no acute distress Eyes: PERRL, normal EOM.  Sclera clear.  No icterus Neuro: CN II-XII intact w/o focal sensory/motor deficits. Lymph: No head/neck/groin lymphadenopathy Psych:  No delerium/psychosis/paranoia.  Oriented x 4 HENT: Normocephalic, Mucus membranes moist.  No thrush Neck: Supple, No tracheal deviation.  No obvious thyromegaly Chest: No pain to chest wall compression.  Good respiratory excursion.  No audible wheezing CV:  Pulses intact.  Regular rhythm.  No major extremity edema MS: Normal AROM mjr joints.  No obvious deformity Abdomen: Soft.  Nondistended.  Nontender.  No evidence of peritonitis.  No incarcerated hernias.  GU: Left anterior perirectal wound 5 cm.  I can easily probed my finger up to is draining well.  Some drainage.  Ext:   No deformity.  No mjr edema.  No cyanosis Skin: No petechiae / purpurea.  No major sores.  Warm and dry    Results:   Cultures: Recent Results (from the past 720 hour(s))  Clostridium Difficile by PCR([facility]/Sun[facility])     Status: None   Collection Time: 10/27/19 10:30 AM   Specimen: Stool   Stool  Result Value Ref Range Status   Toxigenic C. Difficile by PCR Negative Negative Final  Respiratory Panel by RT PCR (Flu A&B, Covid) - Nasopharyngeal Swab     Status: None   Collection Time: 11/09/19  7:37 PM   Specimen: Nasopharyngeal Swab  Result Value Ref Range Status   SARS Coronavirus 2 by RT PCR NEGATIVE NEGATIVE Final    Comment: (NOTE) SARS-CoV-2 target nucleic acids are NOT DETECTED.  The SARS-CoV-2 RNA is generally detectable in upper respiratoy specimens during the acute phase of infection. The lowest concentration of SARS-CoV-2 viral copies this assay can detect is 131 copies/mL. A negative result does not preclude SARS-Cov-2 infection and should not be used as the sole basis for treatment or other patient management decisions. A negative result may occur with  improper specimen  collection/handling, submission of specimen other than nasopharyngeal swab, presence of viral mutation(s) within the areas targeted by this assay, and inadequate number of viral copies (<131 copies/mL). A negative result must be combined with clinical observations, patient history, and epidemiological information. The expected result is Negative.  Fact Sheet for Patients:  PinkCheek.be  Fact Sheet for Healthcare Providers:  GravelBags.it  This test is no t yet approved or cleared by the [place] FDA and  has been authorized for detection and/or diagnosis of SARS-CoV-2 by FDA under an Emergency Use Authorization (EUA). This EUA will remain  in effect (meaning this test can be used) for the duration of the COVID-19 declaration under Section 564(b)(1) of the Act, 21 U.S.C. section 360bbb-3(b)(1), unless the authorization is terminated or revoked sooner.     Influenza A by PCR NEGATIVE NEGATIVE Final   Influenza B by PCR NEGATIVE NEGATIVE Final    Comment: (NOTE) The Xpert Xpress SARS-CoV-2/FLU/RSV assay is intended as an aid in  the diagnosis of influenza from Nasopharyngeal swab specimens and  should not be used as a sole basis for treatment. Nasal washings and  aspirates are unacceptable for Xpert Xpress SARS-CoV-2/FLU/RSV  testing.  Fact Sheet for Patients: PinkCheek.be  Fact Sheet for Healthcare Providers: GravelBags.it  This test is not yet approved or cleared by the [place] and  has been authorized for detection and/or diagnosis of SARS-CoV-2 by  FDA under an Emergency Use Authorization (EUA). This EUA will remain  in effect (meaning this test can be used) for the duration of the  Covid-19 declaration under Section 564(b)(1) of the Act, 21  U.S.C. section 360bbb-3(b)(1), unless the authorization is  terminated or revoked. Performed at [facility], [address] [place], [place] [address]   Urine culture     Status: Abnormal   Collection Time: 11/09/19  7:37 PM   Specimen: Urine, Random  Result Value Ref Range Status   Specimen Description   Final    URINE, RANDOM Performed at [facility], [address] [place], [place] [address]    Special Requests   Final    NONE Performed at [facility], [address] [place], [place] [address]    Culture >=100,000 COLONIES/mL ESCHERICHIA COLI (A)  Final   Report Status 11/11/2019 FINAL  Final   Organism ID, Bacteria ESCHERICHIA COLI (A)  Final      Susceptibility   Escherichia coli - MIC*    AMPICILLIN >=32 RESISTANT Resistant     CEFAZOLIN <=4 SENSITIVE Sensitive     CEFTRIAXONE <=0.25 SENSITIVE Sensitive     CIPROFLOXACIN >=4 RESISTANT Resistant     GENTAMICIN <=1 SENSITIVE Sensitive     IMIPENEM <=0.25 SENSITIVE Sensitive     NITROFURANTOIN <=16 SENSITIVE Sensitive     TRIMETH/SULFA >=320 RESISTANT Resistant     AMPICILLIN/SULBACTAM 16 INTERMEDIATE Intermediate     PIP/TAZO <=4 SENSITIVE Sensitive     * >=100,000 COLONIES/mL ESCHERICHIA COLI  Culture, blood (routine x 2)     Status: None   Collection Time: 11/09/19  8:01 PM   Specimen: BLOOD RIGHT HAND  Result Value Ref Range Status   Specimen Description   Final    BLOOD RIGHT HAND Performed at [facility], [address] [place], [place] [address]    Special Requests   Final    BOTTLES DRAWN AEROBIC AND ANAEROBIC Blood Culture results may not be optimal due to an inadequate volume of blood received in culture bottles Performed at [facility], [address] [place], [place] [address]    Culture   Final    NO GROWTH 5 DAYS Performed at [facility], [place] [address], [place], [place] [address]    Report Status 11/14/2019 FINAL  Final  Culture, blood (routine x 2)     Status: None   Collection Time: 11/09/19  8:10 PM   Specimen: Right  Antecubital; Blood  Result Value Ref Range Status   Specimen Description   Final    RIGHT ANTECUBITAL Performed at [facility], [address] [place], [place] [address]    Special Requests   Final    BOTTLES DRAWN AEROBIC AND ANAEROBIC Blood Culture adequate volume Performed at [facility], [address], [place], [place] [address]    Culture   Final    NO GROWTH 5 DAYS Performed at [facility], [place] [address] [place], [place] [address]    Report Status 11/14/2019 FINAL  Final  Aerobic/Anaerobic Culture (surgical/deep wound)     Status: None   Collection Time: 11/10/19  3:30 AM   Specimen: Abscess  Result Value Ref Range Status  Specimen Description   Final    ABSCESS Performed at [place] [address] [place], [place] [address]    Special Requests   Final    NONE Performed at [facility], [place] [address] [place], [place] [address]    Gram Stain   Final    FEW WBC SEEN FEW GRAM POSITIVE COCCI Performed at [facility], [place] [address], [place], [place] [address]    Culture   Final    RARE ESCHERICHIA COLI RARE VIRIDANS STREPTOCOCCUS RARE BACTEROIDES FRAGILIS RARE BACTEROIDES OVATUS BETA LACTAMASE POSITIVE Performed at [facility], [place] [address] [place], [place] [address]    Report Status 11/13/2019 FINAL  Final   Organism ID, Bacteria ESCHERICHIA COLI  Final   Organism ID, Bacteria VIRIDANS STREPTOCOCCUS  Final      Susceptibility   Escherichia coli - MIC*    AMPICILLIN >=32 RESISTANT Resistant     CEFAZOLIN <=4 SENSITIVE Sensitive     CEFEPIME <=0.12 SENSITIVE Sensitive     CEFTAZIDIME <=1 SENSITIVE Sensitive     CEFTRIAXONE <=0.25 SENSITIVE Sensitive     CIPROFLOXACIN >=4 RESISTANT Resistant     GENTAMICIN <=1 SENSITIVE Sensitive     IMIPENEM <=0.25 SENSITIVE Sensitive     TRIMETH/SULFA >=320 RESISTANT Resistant     AMPICILLIN/SULBACTAM 16 INTERMEDIATE  Intermediate     PIP/TAZO <=4 SENSITIVE Sensitive     * RARE ESCHERICHIA COLI   Viridans streptococcus - MIC*    PENICILLIN <=0.06 SENSITIVE Sensitive     CEFTRIAXONE 0.25 SENSITIVE Sensitive     ERYTHROMYCIN <=0.12 SENSITIVE Sensitive     LEVOFLOXACIN 0.5 SENSITIVE Sensitive     VANCOMYCIN 0.5 SENSITIVE Sensitive     * RARE VIRIDANS STREPTOCOCCUS  MRSA PCR Screening     Status: None   Collection Time: 11/10/19  4:00 AM   Specimen: Nasopharyngeal  Result Value Ref Range Status   MRSA by PCR NEGATIVE NEGATIVE Final    Comment:        The GeneXpert MRSA Assay (FDA approved for NASAL specimens only), is one component of a comprehensive MRSA colonization surveillance program. It is not intended to diagnose MRSA infection nor to guide or monitor treatment for MRSA infections. Performed at [facility], [place] [address], [place], [place] [address]     Labs: Results for orders placed or performed during the hospital encounter of 11/09/19 (from the past 48 hour(s))  Glucose, capillary     Status: Abnormal   Collection Time: 11/12/19 11:29 AM  Result Value Ref Range   Glucose-Capillary 134 (H) 70 - 99 mg/dL    Comment: Glucose reference range applies only to samples taken after fasting for at least 8 hours.  Glucose, capillary     Status: Abnormal   Collection Time: 11/12/19  5:39 PM  Result Value Ref Range   Glucose-Capillary 150 (H) 70 - 99 mg/dL    Comment: Glucose reference range applies only to samples taken after fasting for at least 8 hours.  Glucose, capillary     Status: Abnormal   Collection Time: 11/12/19  9:52 PM  Result Value Ref Range   Glucose-Capillary 154 (H) 70 - 99 mg/dL    Comment: Glucose reference range applies only to samples taken after fasting for at least 8 hours.  Basic metabolic panel     Status: Abnormal   Collection Time: 11/13/19  5:49 AM  Result Value Ref Range   Sodium 132 (L)  135 - 145 mmol/L   Potassium 4.0 3.5 - 5.1 mmol/L    Chloride 103 98 - 111 mmol/L   CO2 16 (L) 22 - 32 mmol/L   Glucose, Bld 127 (H) 70 - 99 mg/dL    Comment: Glucose reference range applies only to samples taken after fasting for at least 8 hours.   BUN 15 8 - 23 mg/dL   Creatinine, Ser 0.98 0.61 - 1.24 mg/dL   Calcium 8.3 (L) 8.9 - 10.3 mg/dL   GFR calc non Af Amer >60 >60 mL/min   GFR calc Af Amer >60 >60 mL/min   Anion gap 13 5 - 15    Comment: Performed at [facility], [place] [address], [place], [place] [address]  CBC with Differential/Platelet     Status: Abnormal   Collection Time: 11/13/19  5:49 AM  Result Value Ref Range   WBC 7.9 4.0 - 10.5 K/uL   RBC 4.52 4.22 - 5.81 MIL/uL   Hemoglobin 14.3 13.0 - 17.0 g/dL   HCT 41.6 39 - 52 %   MCV 92.0 80.0 - 100.0 fL   MCH 31.6 26.0 - 34.0 pg   MCHC 34.4 30.0 - 36.0 g/dL   RDW 15.9 (H) 11.5 - 15.5 %   Platelets 162 150 - 400 K/uL   nRBC 0.0 0.0 - 0.2 %   Neutrophils Relative % 76 %   Neutro Abs 6.2 1.7 - 7.7 K/uL   Lymphocytes Relative 11 %   Lymphs Abs 0.8 0.7 - 4.0 K/uL   Monocytes Relative 6 %   Monocytes Absolute 0.4 0 - 1 K/uL   Eosinophils Relative 4 %   Eosinophils Absolute 0.3 0 - 0 K/uL   Basophils Relative 1 %   Basophils Absolute 0.1 0 - 0 K/uL   Immature Granulocytes 2 %   Abs Immature Granulocytes 0.12 (H) 0.00 - 0.07 K/uL    Comment: Performed at [facility], [place] [address] [place], [place] [address]  Magnesium     Status: None   Collection Time: 11/13/19  5:49 AM  Result Value Ref Range   Magnesium 1.8 1.7 - 2.4 mg/dL    Comment: Performed at [facility], [place] [address], [place], [place] [address]  Glucose, capillary     Status: Abnormal   Collection Time: 11/13/19  8:09 AM  Result Value Ref Range   Glucose-Capillary 116 (H) 70 - 99 mg/dL    Comment: Glucose reference range applies only to samples taken after fasting for at least 8 hours.  Glucose, capillary     Status: Abnormal   Collection  Time: 11/13/19 11:41 AM  Result Value Ref Range   Glucose-Capillary 158 (H) 70 - 99 mg/dL    Comment: Glucose reference range applies only to samples taken after fasting for at least 8 hours.  Glucose, capillary     Status: Abnormal   Collection Time: 11/13/19  5:49 PM  Result Value Ref Range   Glucose-Capillary 214 (H) 70 - 99 mg/dL    Comment: Glucose reference range applies only to samples taken after fasting for at least 8 hours.  Glucose, capillary     Status: Abnormal   Collection Time: 11/13/19 10:23 PM  Result Value Ref Range   Glucose-Capillary 169 (H) 70 - 99 mg/dL    Comment: Glucose reference range applies only to samples taken after fasting for at least 8 hours.  Glucose, capillary     Status: Abnormal   Collection  Time: 11/14/19  7:42 AM  Result Value Ref Range   Glucose-Capillary 127 (H) 70 - 99 mg/dL    Comment: Glucose reference range applies only to samples taken after fasting for at least 8 hours.    Imaging / Studies: VAS [place] LOWER EXTREMITY VENOUS (DVT)  Result Date: 11/12/2019  Lower Venous DVTStudy Indications: Chronic bilateral calf pain.  Limitations: Body habitus, poor ultrasound/tissue interface and patient discomfort. Comparison Study: No prior study Performing Technologist: Maudry Mayhew MHA, RDMS, RVT, RDCS  Examination Guidelines: A complete evaluation includes B-mode imaging, spectral Doppler, color Doppler, and power Doppler as needed of all accessible portions of each vessel. Bilateral testing is considered an integral part of a complete examination. Limited examinations for reoccurring indications may be performed as noted. The reflux portion of the exam is performed with the patient in reverse Trendelenburg.  +---------+---------------+---------+-----------+----------+--------------+ RIGHT    CompressibilityPhasicitySpontaneityPropertiesThrombus Aging +---------+---------------+---------+-----------+----------+--------------+ FV Prox  Full                     Yes        Patent                   +---------+---------------+---------+-----------+----------+--------------+ FV Mid                           Yes                                 +---------+---------------+---------+-----------+----------+--------------+ FV DistalFull                                                        +---------+---------------+---------+-----------+----------+--------------+ PFV      Full                    Yes                                 +---------+---------------+---------+-----------+----------+--------------+ POP      Full           No       Yes                                 +---------+---------------+---------+-----------+----------+--------------+ PTV      Full                    Yes                                 +---------+---------------+---------+-----------+----------+--------------+ PERO     Full                    Yes                                 +---------+---------------+---------+-----------+----------+--------------+   Right Technical Findings: Not visualized segments include SFJ, CFV.  +---------+---------------+---------+-----------+----------+--------------+ LEFT     CompressibilityPhasicitySpontaneityPropertiesThrombus Aging +---------+---------------+---------+-----------+----------+--------------+ CFV      Full  No       Yes                                 +---------+---------------+---------+-----------+----------+--------------+ FV Prox  Full                                                        +---------+---------------+---------+-----------+----------+--------------+ FV Mid   Full                                                        +---------+---------------+---------+-----------+----------+--------------+ FV Distal                        Yes                                 +---------+---------------+---------+-----------+----------+--------------+  POP      Full           No       Yes                                 +---------+---------------+---------+-----------+----------+--------------+ PTV      Full                                                        +---------+---------------+---------+-----------+----------+--------------+ PERO     Full                                                        +---------+---------------+---------+-----------+----------+--------------+   Left Technical Findings: Not visualized segments include SFJ, PFV, limited evaluation of PTV and peroneal veins.   Summary: RIGHT: - There is no evidence of deep vein thrombosis in the lower extremity. However, portions of this examination were limited- see technologist comments above.  - No cystic structure found in the popliteal fossa.  LEFT: - There is no evidence of deep vein thrombosis in the lower extremity. However, portions of this examination were limited- see technologist comments above.  - No cystic structure found in the popliteal fossa.  Bilateral lower extremity venous flow is pulsatile, suggestive of possibly elevated right heart pressure.  *See table(s) above for measurements and observations. Electronically signed by Monica Martinez MD on 11/12/2019 at 5:07:25 PM.    Final     Medications / Allergies: per chart  Antibiotics: Anti-infectives (From admission, onward)    Start     Dose/Rate Route Frequency Ordered Stop   11/14/19 1400  cephALEXin (KEFLEX) capsule 1,000 mg        1,000 mg Oral Every 8 hours 11/14/19 0737     11/14/19 0000  cephALEXin (KEFLEX) 500 MG capsule  1,000 mg Oral 3 times daily 11/14/19 0740 11/24/19 2359   11/14/19 0000  metroNIDAZOLE (FLAGYL) 500 MG tablet        500 mg Oral Every 8 hours 11/14/19 0740 11/24/19 2359   11/13/19 1600  cephALEXin (KEFLEX) capsule 500 mg  Status:  Discontinued        500 mg Oral Every 8 hours 11/13/19 1514 11/14/19 0737   11/13/19 1600  metroNIDAZOLE (FLAGYL) tablet 500 mg         500 mg Oral Every 8 hours 11/13/19 1514     11/12/19 2000  cefTRIAXone (ROCEPHIN) 2 g in sodium chloride 0.9 % 100 mL IVPB  Status:  Discontinued        2 g 200 mL/hr over 30 Minutes Intravenous Daily at bedtime 11/12/19 1357 11/13/19 1514   11/10/19 1000  vancomycin (VANCOCIN) IVPB 1000 mg/200 mL premix  Status:  Discontinued        1,000 mg 200 mL/hr over 60 Minutes Intravenous Every 12 hours 11/09/19 2134 11/12/19 1357   11/10/19 1000  fluconazole (DIFLUCAN) tablet 200 mg        200 mg Oral Daily 11/10/19 0132 11/12/19 0850   11/10/19 0900  metroNIDAZOLE (FLAGYL) IVPB 500 mg  Status:  Discontinued        500 mg 100 mL/hr over 60 Minutes Intravenous Every 8 hours 11/10/19 0731 11/13/19 1514   11/10/19 0600  ceFEPIme (MAXIPIME) 2 g in sodium chloride 0.9 % 100 mL IVPB  Status:  Discontinued        2 g 200 mL/hr over 30 Minutes Intravenous Every 8 hours 11/09/19 2134 11/12/19 1357   11/10/19 0600  clindamycin (CLEOCIN) IVPB 900 mg  Status:  Discontinued        900 mg 100 mL/hr over 30 Minutes Intravenous On call to O.R. 11/10/19 0113 11/10/19 0516   11/09/19 2130  ceFEPIme (MAXIPIME) 2 g in sodium chloride 0.9 % 100 mL IVPB        2 g 200 mL/hr over 30 Minutes Intravenous  Once 11/09/19 2128 11/09/19 2217   11/09/19 2130  vancomycin (VANCOCIN) IVPB 1000 mg/200 mL premix        1,000 mg 200 mL/hr over 60 Minutes Intravenous Every hour 11/09/19 2128 11/09/19 2333   11/09/19 2115  clindamycin (CLEOCIN) IVPB 900 mg        900 mg 100 mL/hr over 30 Minutes Intravenous  Once 11/09/19 2114 11/09/19 2147         Note: Portions of this report may have been transcribed using voice recognition software. Every effort was made to ensure accuracy; however, inadvertent computerized transcription errors may be present.   Any transcriptional errors that result from this process are unintentional.    Adin Hector, MD, FACS, MASCRS Gastrointestinal and Minimally Invasive  Surgery  [facility] [address], [place], [place] [address] [phone] Fax [phone] Main/Paging  CONTACT INFORMATION: Weekday (9AM-5PM) concerns: Call [facility] main office at [phone] Weeknight (5PM-9AM) or Weekend/Holiday concerns: Check [email or web address] for General Surgery [facility] coverage (Please, do not use SecureChat as it is not reliable communication to operating surgeons for immediate patient care)      11/14/2019  8:10 AM

## 2019-11-14 NOTE — Progress Notes (Signed)
In and out cath results: 900 cc. Made [facility] Coverage aware.

## 2019-11-15 DIAGNOSIS — K611 Rectal abscess: Secondary | ICD-10-CM | POA: Diagnosis not present

## 2019-11-15 LAB — GLUCOSE, CAPILLARY
Glucose-Capillary: 114 mg/dL — ABNORMAL HIGH (ref 70–99)
Glucose-Capillary: 139 mg/dL — ABNORMAL HIGH (ref 70–99)
Glucose-Capillary: 160 mg/dL — ABNORMAL HIGH (ref 70–99)
Glucose-Capillary: 136 mg/dL — ABNORMAL HIGH (ref 70–99)

## 2019-11-15 MED ORDER — PSYLLIUM 95 % PO PACK: 1.0000 | PACK | Freq: Two times a day (BID) | ORAL | 0 refills | Status: DC

## 2019-11-15 MED ORDER — TAMSULOSIN HCL 0.4 MG PO CAPS: 0.4000 mg | ORAL_CAPSULE | Freq: Every day | ORAL | 0 refills | Status: DC

## 2019-11-15 MED ORDER — APIXABAN 5 MG PO TABS: 5.0000 mg | ORAL_TABLET | Freq: Two times a day (BID) | ORAL | 0 refills | Status: DC

## 2019-11-15 MED ORDER — BISOPROLOL FUMARATE 5 MG PO TABS: 2.5000 mg | ORAL_TABLET | Freq: Every day | ORAL | 0 refills | Status: DC

## 2019-11-15 MED ORDER — POLYETHYLENE GLYCOL 3350 17 G PO PACK: 17.0000 g | PACK | Freq: Every day | ORAL | 0 refills | Status: DC | PRN

## 2019-11-15 NOTE — Progress Notes (Signed)
Pt. seen for BiPAP, has been tolerating well, has own hose/mask and is currently on room air and able to place on independantly, made aware to notify if needed.

## 2019-11-15 NOTE — Discharge Summary (Signed)
Discharge Summary  Bobbie Valletta CVE:[id number] DOB: 1945/03/17  PCP: [name], MD  Admit date: 11/09/2019 Discharge date: 11/15/2019  Time spent: 35 minutes  Recommendations for Outpatient Follow-up:  1. Follow-up with general surgery 2. Follow-up with urology for a voiding trial. 3. Follow-up with cardiology. 4. Follow-up with your PCP 5. Take your medication as prescribed 6. Continue PT OT with assistance and fall precautions.  Discharge Diagnoses:  Active Hospital Problems   Diagnosis Date Noted  . Suprasphincteric perirectal abscess s/p I&D 11/10/2019 11/10/2019  . Acute urinary retention s/p Foley 11/10/2019 11/10/2019  . Diarrhea 11/10/2019  . Perineal abscess 11/10/2019  . GERD without esophagitis 11/10/2019  . Atrial fibrillation ([place]) 11/10/2019  . Uncontrolled type 2 diabetes mellitus with hyperglycemia, with long-term current use of insulin ([place]) 12/27/2014  . Essential hypertension 12/27/2014  . Obstructive sleep apnea 04/04/2009    Resolved Hospital Problems  No resolved problems to display.    Discharge Condition: Stable  Diet recommendation: Heart healthy carb modified diet.  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 Patient presented to [place] emergency department with complaints of frequent bouts of watery diarrhea and rectal pain.  Patient explains that for at least the past 6 weeks he has been experiencing what he describes as explosive bouts of watery diarrhea.  Associated with progressively worsening rectal discomfort  and frequent bouts of vomiting and inability to tolerate oral intake for the past several days.  Underwent recent EGD approximately 1 week prior to presentation with esophageal dilation.  Presented to [place] emergency department at the direction of his primary care provider.  At [facility],  CT abdomen and pelvis revealed evidence of abscess formation of 3.4 x 2.5 cm just below the base of the penis. There was initially concern for Fournier's gangrene and therefore after discussion with urology and general surgery patient was transferred to the [facility] long ED for further evaluation and management.   Work up revealed perineal abscess without Fournier's. Patient was taken to the operating room at [facility] long by Dr. Johney Maine for an incision and drainage. TRH was asked to admit.  Hospital course complicated by acute urinary retention.  His bladder was extremely distended on CT scan.  Foley catheter was placed on 11/10/2019 and removed on 11/13/2019.  He failed a voiding trial and a Foley catheter was replaced on 11/14/19.  Discussed with Dr. Jacalyn Lefevre of urology, will follow up outpatient for a voiding trial in the office.  Recommended to continue Flomax.  11/15/19:  No acute events overnight.  No new complaints.  Lab studies and vital signs reviewed and are stable.    Hospital Course:  Principal Problem:   Suprasphincteric perirectal abscess s/p I&D 11/10/2019 Active Problems:   Obstructive sleep apnea   Essential hypertension   Uncontrolled type 2 diabetes mellitus with hyperglycemia, with long-term current use of insulin (HCC)   Acute urinary retention s/p Foley 11/10/2019   Diarrhea   Perineal abscess   GERD without esophagitis   Atrial fibrillation (HCC)  Sepsis,  resolved, 2/2 Suprasphincteric perirectal abscess s/p I&D 11/10/2019 by Dr. Johney Maine, E-coli UTI  Patient is status post successful incision and drainage by Dr. Johney Maine  Surgery following and assisting with  education on dressing changes  Wound culture grew rare e-coli and rare viridans streptococcus, also rare BACTEROIDES FRAGILIS, RARE BACTEROIDES OVATUS, BETA LACTAMASE POSITIVE, FINAL.  Switch Rocephin and IV flagyl to keflex and po flagyl on 11/13/2019.  Patient has a history of a perianal fistula requiring fistulotomy in 2019 at [facility]. The appearance of the abscess is concerning for the development of another fistula.  Blood cultures no growth to date  Pain control.  We appreciate surgery's assistance  Follow-up with surgery outpatient.  Acute urinary retention Urinary retention post op, Foley catheter placed on 11/10/2019 Foley catheter was removed on 11/13/2019, unable to void, in and out cath revealed 900 cc urine output. Foley catheter re-placed on 11/14/19 after failing voiding trials, patient will need to follow-up with urology outpatient with voiding trial in the office, discussed with Dr. Claudia Desanctis Continue Flomax Follow-up with urology outpatient.  New onset atrial fibrillation  Patient noted to be in atrial fibrillation on 11/09/19, in the setting of sepsis.  TSH normal 1.1, troponin within normal limit 15  2D echo done on 11/11/2019 showed LVEF 60 to 65% with grade 2 diastolic dysfunction  Chadsvasc score of 3  Cardiology consulted to advise on [facility]  He is on home Bisoprolol, this has controlled his rate  Added telemetry monitoring  Started DOAC Eliquis on 9/30  Will follow up with Afib clinic/cardiology outpatient  Resolving E. coli UTI Urine culture growing greater than 100,000 colonies of E. coli with resistance to ampicillin, intermediate resistance to ampicillin/sulbactam, ciprofloxacin, and Bactrim.  Osteoarthritis/severe lower extremity tenderness with palpation.  Ruled out DVT with B/L doppler US Continue home regimen Follow-up with your PCP  History of gout On allopurinol.  Diarrhea, improving  Patient reports 6 to 8-week history of  diarrhea, etiology unclear  Had stool studies performed by his primary care provider a week or so ago that were negative for C. Difficile  No enteric precautions required  Follow-up with your PCP  Essential hypertension BPs are soft Currently on bisoprolol 2.5 mg daily  Maxzide and Telmesartan help to avoid hypotension Resume home Lasix and potassium replacement. Follow-up with your PCP  Uncontrolled type 2 diabetes mellitus with hyperglycemia,  Recent hemoglobin A1c was found to be elevated at 9.1 on 10/26/19  Resume home regimen.  Follow-up with your PCP  Severe morbid obesity BMI 42 Recommend weight loss outpatient with regular physical activity and healthy dieting Will benefit from bariatric surgery for weight loss evaluation  Physical debility PT recommended home health PT OT Continue PT OT with assistance and fall precaution.     Code Status:Full code    Consultants:  General surgery  Cardiology  Procedures:  Post I&D  Antimicrobials:  IV vancomycin, completed  Cefepime, completed  IV Flagyl, completed  Keflex, po flagyl 11/13/19>>11/24/19.   Discharge Exam: BP 125/69 (BP Location: Left Arm)   Pulse 65   Temp 98 F (36.7 C) (Oral)   Resp (!) 22   Ht 5' 10.98" (1.803 m)   Wt (!) 137.4 kg   SpO2 96%   BMI 42.27 kg/m  . General: 74 y.o. year-old male well developed well nourished in no acute distress.  Alert and oriented x3. . Cardiovascular: Regular rate and rhythm with no rubs or gallops.  No thyromegaly or JVD noted.   Marland Kitchen  Respiratory: Clear to auscultation with no wheezes or rales. Good inspiratory effort. . Abdomen: Soft nontender nondistended with normal bowel sounds x4 quadrants. . Musculoskeletal: No lower extremity edema bilaterally. Marland Kitchen Psychiatry: Mood is appropriate for condition and setting  Discharge Instructions You were cared for by a hospitalist during your hospital stay. If you have any questions about  your discharge medications or the care you received while you were in the hospital after you are discharged, you can call the unit and asked to speak with the hospitalist on call if the hospitalist that took care of you is not available. Once you are discharged, your primary care physician will handle any further medical issues. Please note that NO REFILLS for any discharge medications will be authorized once you are discharged, as it is imperative that you return to your primary care physician (or establish a relationship with a primary care physician if you do not have one) for your aftercare needs so that they can reassess your need for medications and monitor your lab values.   Allergies as of 11/15/2019      Reactions   Penicillins Hives   Patient reports full body hives that required medical treatment when he was in his 73s or 61s. He tolerated cephalosporins.    Lyrica [pregabalin] Swelling   Levofloxacin Swelling      Medication List    STOP taking these medications   etodolac 400 MG tablet Commonly known as: LODINE   Myrbetriq 25 MG Tb24 tablet Generic drug: mirabegron ER   ondansetron 8 MG tablet Commonly known as: ZOFRAN   telmisartan 40 MG tablet Commonly known as: MICARDIS   triamterene-hydrochlorothiazide 75-50 MG tablet Commonly known as: MAXZIDE     TAKE these medications   Albuterol Sulfate 108 (90 Base) MCG/ACT Aepb Commonly known as: ProAir RespiClick Inhale 2 puffs into the lungs every 6 (six) hours as needed (for wheezing or shortness of breath).   allopurinol 300 MG tablet Commonly known as: ZYLOPRIM TAKE 1 TABLET(300 MG) BY MOUTH DAILY What changed: See the new instructions.   apixaban 5 MG Tabs tablet Commonly known as: ELIQUIS Take 1 tablet (5 mg total) by mouth 2 (two) times daily.   aspirin 81 MG tablet Take 81 mg by mouth daily.   atorvastatin 40 MG tablet Commonly known as: LIPITOR TAKE 1 TABLET(40 MG) BY MOUTH EVERY EVENING What changed:    how much to take  how to take this  when to take this  additional instructions   B-12 2000 MCG Tabs Take 1 tablet by mouth daily.   bisoprolol 5 MG tablet Commonly known as: ZEBETA Take 0.5 tablets (2.5 mg total) by mouth daily. What changed:   how much to take  how to take this  when to take this  additional instructions   cephALEXin 500 MG capsule Commonly known as: KEFLEX Take 2 capsules (1,000 mg total) by mouth 3 (three) times daily for 10 days.   cetirizine 10 MG tablet Commonly known as: ZYRTEC TAKE 1 TABLET(10 MG) BY MOUTH DAILY What changed:   how much to take  how to take this  when to take this  additional instructions   donepezil 10 MG tablet Commonly known as: Aricept Take 1 tablet (10 mg total) by mouth at bedtime.   fluocinonide cream 0.05 % Commonly known as: LIDEX   fluticasone 50 MCG/ACT nasal spray Commonly known as: FLONASE Place 2 sprays into both nostrils daily. What changed:   when to take this  reasons to take this   folic acid 1 MG tablet Commonly known as: FOLVITE Take 1 mg by mouth daily.   furosemide 20 MG tablet Commonly known as: LASIX TAKE 1 TABLET(20 MG) BY MOUTH DAILY What changed:   how much to take  how to take this  when to take this  additional instructions   Jardiance 10 MG Tabs tablet Generic drug: empagliflozin Take 10 mg by mouth daily before breakfast.   meloxicam 15 MG tablet Commonly known as: MOBIC Take 1 tablet by mouth daily.   metroNIDAZOLE 500 MG tablet Commonly known as: FLAGYL Take 1 tablet (500 mg total) by mouth every 8 (eight) hours for 10 days.   nystatin ointment Commonly known as: MYCOSTATIN Apply 1 application topically 2 (two) times daily. What changed:   when to take this  reasons to take this   omeprazole 40 MG capsule Commonly known as: PRILOSEC TAKE 1 CAPSULE BY MOUTH 30 TO 60 MINUTES BEFORE YOUR FIRST AND LAST MEAL OF THE DAY What changed:   how much  to take  how to take this  when to take this  additional instructions   polyethylene glycol 17 g packet Commonly known as: MIRALAX / GLYCOLAX Take 17 g by mouth daily as needed for mild constipation.   potassium chloride 10 MEQ CR capsule Commonly known as: MICRO-K TAKE 1 CAPSULE BY MOUTH EVERY DAY What changed:   how much to take  how to take this  when to take this  additional instructions   psyllium 95 % Pack Commonly known as: HYDROCIL/METAMUCIL Take 1 packet by mouth 2 (two) times daily.   sertraline 50 MG tablet Commonly known as: ZOLOFT TAKE 1 TABLET(50 MG) BY MOUTH DAILY. What changed:   how much to take  how to take this  when to take this  additional instructions   tamsulosin 0.4 MG Caps capsule Commonly known as: FLOMAX Take 1 capsule (0.4 mg total) by mouth daily after supper.   tiZANidine 4 MG tablet Commonly known as: ZANAFLEX Take 1 tablet (4 mg total) by mouth every 6 (six) hours as needed for muscle spasms.   UNABLE TO FIND Med Name: BiPap with [employer]   Vitamin D 50 MCG (2000 UT) tablet Take 2,000 Units by mouth daily.      Allergies  Allergen Reactions  . Penicillins Hives    Patient reports full body hives that required medical treatment when he was in his 39s or 33s. He tolerated cephalosporins.   [name] [Pregabalin] Swelling  . Levofloxacin Swelling    Follow-up Information    Michael Boston, MD Follow up on 11/26/2019.   Specialty: General Surgery Why: 11:00am, arrive by 10:30am for paperwork and check in process Contact information: [place] [address] [phone]        Malka So R, [place] Follow up.   Specialty: Cardiology Why: Follow-up visit in [facility] scheduled for 11/23/2019 at 2pm. Parking code for October is 3009. You can enter through entrance C. Contact information: [place] [address] [phone]        [name], MD. Call  in 1 day(s).   Specialty: Family Medicine Why: Please call for a post hospital follow up appointment Contact information: [address] [place] [address] [phone]        Troy Sine, MD .   Specialty: Cardiology Contact information: [address] [place]  [place]        Robley Fries, MD. Call in 1 day(s).   Specialty: Urology Why: Please call for a post hospital follow up appointment Contact information: [address] [place] [address] [phone]                The results of significant diagnostics from this hospitalization (including imaging, microbiology, ancillary and laboratory) are listed below for reference.    Significant Diagnostic Studies: MR Brain Wo Contrast  Result Date: 10/29/2019 CLINICAL DATA:  Memory loss.  Dementia. EXAM: MRI HEAD WITHOUT CONTRAST TECHNIQUE: Multiplanar, multiecho pulse sequences of the brain and surrounding structures were obtained without intravenous contrast. COMPARISON:  02/28/2006 FINDINGS: Brain: Generalized atrophy with relative frontal and temporal predominance. Diffusion imaging does not show any acute or subacute infarction. Mild chronic small-vessel ischemic changes affect pons. No focal cerebellar finding. Cerebral hemispheres show moderate chronic small-vessel ischemic change of the white matter. No cortical or large vessel territory infarction. No mass lesion, hemorrhage, hydrocephalus or extra-axial collection. Vascular: Major vessels at the base of the brain show flow. Skull and upper cervical spine: Negative Sinuses/Orbits: Clear/normal Other: None IMPRESSION: No acute or reversible finding. Generalized atrophy with relative frontal and temporal predominance. Moderate chronic small-vessel ischemic changes of the cerebral hemispheric white matter. Electronically Signed   By: Nelson Chimes M.D.   On: 10/29/2019 10:53   CT Abdomen Pelvis W  Contrast  Result Date: 11/09/2019 CLINICAL DATA:  Bowel obstruction suspected vomiting since esophageal dilation, unable to tolerate PO Increasing weakness. EXAM: CT ABDOMEN AND PELVIS WITH CONTRAST TECHNIQUE: Multidetector CT imaging of the abdomen and pelvis was performed using the standard protocol following bolus administration of intravenous contrast. CONTRAST:  59mL OMNIPAQUE IOHEXOL 300 MG/ML  SOLN COMPARISON:  CT 11 days ago 10/29/2019 FINDINGS: Lower chest: Again seen subpleural fat at the lung bases. No pleural fluid. No lower pneumomediastinum or esophageal wall thickening. No focal consolidation. Hepatobiliary: Decreased hepatic density consistent with steatosis. Previous geographic fatty distribution is more homogeneous on the current exam, likely related to differences in contrast timing. There is no discrete focal hepatic lesion. Layering gallstones without pericholecystic inflammation. There is no biliary dilatation. No visualized choledocholithiasis. Pancreas: Fatty atrophy.  No ductal dilatation or inflammation. Spleen: Normal in size without focal abnormality. Adrenals/Urinary Tract: Normal right adrenal gland. Punctate left adrenal calcification unchanged. No hydronephrosis or perinephric edema. Again seen thinning of bilateral renal parenchyma. There are bilateral renal cysts are unchanged since recent exam. The urinary bladder is distended. No bladder wall thickening. Bladder(volume = 1580 cm^3). Stomach/Bowel: No distal esophageal wall thickening. Stomach is decompressed. Normal positioning of the duodenum and ligament of Treitz. Decompressed small bowel without obstruction or inflammation. Normal appendix. Small volume of stool throughout the colon. Sigmoid colon is tortuous. Distal colonic diverticulosis without focal diverticulitis. There is no colonic wall thickening or inflammation. Vascular/Lymphatic: Aorto bi-iliac atherosclerosis without aortic aneurysm. The portal vein is patent. No  acute vascular finding. No abdominopelvic adenopathy. Reproductive: Normal sized prostate gland, see details below regarding adjacent soft tissue air. Other: There is edema and soft tissue gas involving the left perineum extending to the base of the penis. Small perineal fluid collection measures 3.4 x 2.5 cm, series 2, image 87. Tracking soft tissue air extends superiorly along the inferior margin of the prostate gland. No abdominopelvic ascites. Musculoskeletal: Multilevel degenerative change throughout the spine. Degenerative change of the hips. No bony destruction or acute osseous abnormality. IMPRESSION: 1. No bowel  obstruction. 2. Midline perineal abscess measuring 3.4 x 2.5 cm just below the base of the penis anterior to the rectum, with patchy soft tissue air tracking in the left perineum, into the subcutaneous tissues. Findings highly suspicious for Fournier's gangrene. 3. Distended urinary bladder without bladder wall thickening. 4. Hepatic steatosis. Cholelithiasis without gallbladder inflammation. 5. Colonic diverticulosis without diverticulitis. Aortic Atherosclerosis (ICD10-I70.0). These results were called by telephone at the time of interpretation on 11/09/2019 at 9:17 pm to Dr Apolonio Schneiders LITTLE , who verbally acknowledged these results. Electronically Signed   By: Keith Rake M.D.   On: 11/09/2019 21:18   CT Abdomen Pelvis W Contrast  Result Date: 10/29/2019 CLINICAL DATA:  Diarrhea and shortness of breath for 6 weeks. EXAM: CT ABDOMEN AND PELVIS WITH CONTRAST TECHNIQUE: Multidetector CT imaging of the abdomen and pelvis was performed using the standard protocol following bolus administration of intravenous contrast. CONTRAST:  124mL OMNIPAQUE IOHEXOL 300 MG/ML  SOLN COMPARISON:  Abdominal ultrasound 05/19/2012, 06/05/2011 FINDINGS: Lower chest: Subpleural fat noted in the lung bases. Few bandlike areas of opacity, likely scarring or atelectasis. Lung bases are otherwise clear. Abundant  mediastinal and pericardial fat noted as well. Cardiac size is within normal limits. No pericardial effusion. Hepatobiliary: Geographic regions of hypoattenuation throughout the liver likely reflect areas of hepatic steatosis/fatty infiltration. No focal concerning liver lesion. Smooth liver surface contour. Gallbladder contains multiple dependently layering calcified gallstones. No gallbladder wall thickening, pericholecystic fluid or inflammation. No visible calcified intraductal gallstones or biliary dilatation. Pancreas: Partial fatty replacement of the pancreas. No pancreatic ductal dilatation or surrounding inflammatory changes. Spleen: No frank splenomegaly.  No concerning splenic lesions. Adrenals/Urinary Tract: Calcification of the left adrenal gland could reflect sequela of prior infection or hemorrhage. No concerning adrenal lesion. Bilateral renal atrophy/cortical thinning. Multiple fluid attenuation cysts are present in both kidneys, largest is seen in the right kidney measuring up to 7.1 cm in the upper pole. No concerning focal renal lesions. No urolithiasis or hydronephrosis. Moderate bladder distension without other gross bladder abnormality. Stomach/Bowel: Distal esophagus, stomach and duodenum are unremarkable. No small bowel thickening or dilatation. A normal appendix is visualized in the right lower quadrant. No proximal colonic thickening or dilatation. Numerous distal colonic diverticula and a segment of circumferentially thickened versus decompressed colon seen in the proximal sigmoid in a region of these diverticular but without acute pericolonic inflammation to suggest an active diverticulitis at this time. No evidence of bowel obstruction. Vascular/Lymphatic: Atherosclerotic calcifications within the abdominal aorta and branch vessels. No aneurysm or ectasia. No enlarged abdominopelvic lymph nodes. Reproductive: The prostate and seminal vesicles are unremarkable. Other: No abdominopelvic  free fluid or free gas. No bowel containing hernias. Fat containing inguinal hernias bilaterally. Small fat containing umbilical hernia. Musculoskeletal: Mild dextrocurvature of the spine. Multilevel flowing anterior osteophytosis of the lower thoracic levels, compatible with features of diffuse idiopathic skeletal hyperostosis (DISH). Additional multilevel discogenic and facet degenerative changes in the lumbar levels as well. No mild-to-moderate degenerative changes in the hips and pelvis. No acute or suspicious osseous lesions. IMPRESSION: 1. Extensive distal colonic diverticulosis. Segment of circumferentially thickened versus decompressed colon seen in the proximal sigmoid in a region of these diverticula but without acute pericolonic inflammation. May reflect sequela of prior inflammation though should consider outpatient correlation with colonoscopy if not recently performed. 2. Cholelithiasis without evidence of acute cholecystitis. 3. Geographic regions of hypoattenuation throughout the liver likely reflect areas of hepatic steatosis/fatty infiltration. 4. Bilateral renal atrophy/cortical thinning. Multiple simple appearing cysts bilaterally.  5. Aortic Atherosclerosis (ICD10-I70.0). Electronically Signed   By: Lovena Le M.D.   On: 10/29/2019 18:42   ECHOCARDIOGRAM COMPLETE  Result Date: 11/11/2019    ECHOCARDIOGRAM REPORT   Patient Name:   CARROLL LINGELBACH  Date of Exam: 11/11/2019 Medical Rec #:  [id number]  Height:       71.0 in Accession #:    [id number] Weight:       302.9 lb Date of Birth:  1945-03-04  BSA:          2.515 m Patient Age:    14 years   BP:           98/58 mmHg Patient Gender: M          HR:           56 bpm. Exam Location:  Inpatient Procedure: 2D Echo Indications:    427.31 atrial fibrillation  History:        Patient has prior history of Echocardiogram examinations, most                 recent 10/11/2017. Risk Factors:Hypertension and Diabetes.  Sonographer:    Jannett Celestine RDCS (AE)  Referring Phys: [id number] Sherryll Burger [facility]  Sonographer Comments: Technically difficult study due to poor echo windows, no parasternal window and patient is morbidly obese. Image acquisition challenging due to patient body habitus, Image acquisition challenging due to respiratory motion and restricted mobility. IMPRESSIONS  1. Left ventricular ejection fraction, by estimation, is 60 to 65%. The left ventricle has normal function. The left ventricle has no regional wall motion abnormalities. Left ventricular diastolic parameters are consistent with Grade II diastolic dysfunction (pseudonormalization).  2. Right ventricular systolic function is normal. The right ventricular size is normal.  3. The mitral valve is normal in structure. No evidence of mitral valve regurgitation. No evidence of mitral stenosis.  4. The aortic valve is normal in structure. Aortic valve regurgitation is not visualized. No aortic stenosis is present.  5. The inferior vena cava is normal in size with greater than 50% respiratory variability, suggesting right atrial pressure of 3 mmHg. FINDINGS  Left Ventricle: Left ventricular ejection fraction, by estimation, is 60 to 65%. The left ventricle has normal function. The left ventricle has no regional wall motion abnormalities. Definity contrast agent was given IV to delineate the left ventricular  endocardial borders. The left ventricular internal cavity size was normal in size. There is no left ventricular hypertrophy. Left ventricular diastolic parameters are consistent with Grade II diastolic dysfunction (pseudonormalization). Right Ventricle: The right ventricular size is normal. No increase in right ventricular wall thickness. Right ventricular systolic function is normal. Left Atrium: Left atrial size was not well visualized. Right Atrium: Right atrial size was normal in size. Pericardium: There is no evidence of pericardial effusion. Mitral Valve: The mitral valve is normal in structure.  No evidence of mitral valve regurgitation. No evidence of mitral valve stenosis. Tricuspid Valve: The tricuspid valve is not well visualized. Tricuspid valve regurgitation is trivial. No evidence of tricuspid stenosis. Aortic Valve: The aortic valve is normal in structure. Aortic valve regurgitation is not visualized. No aortic stenosis is present. Pulmonic Valve: The pulmonic valve was normal in structure. Pulmonic valve regurgitation is not visualized. No evidence of pulmonic stenosis. Aorta: The aortic root is normal in size and structure. Venous: The inferior vena cava is normal in size with greater than 50% respiratory variability, suggesting right atrial pressure of 3 mmHg. IAS/Shunts: No atrial level shunt detected by  color flow Doppler.   Diastology LV e' medial:    5.44 cm/s LV E/e' medial:  17.2 LV e' lateral:   6.53 cm/s LV E/e' lateral: 14.4  AORTIC VALVE LVOT Vmax:   58.10 cm/s LVOT Vmean:  42.500 cm/s LVOT VTI:    0.123 m MITRAL VALVE MV Area (PHT): 2.26 cm    SHUNTS MV Decel Time: 335 msec    Systemic VTI: 0.12 m MV E velocity: 93.80 cm/s MV A velocity: 38.60 cm/s MV E/A ratio:  2.43 Candee Furbish MD Electronically signed by Candee Furbish MD Signature Date/Time: 11/11/2019/3:31:15 PM    Final    VAS [place] LOWER EXTREMITY VENOUS (DVT)  Result Date: 11/12/2019  Lower Venous DVTStudy Indications: Chronic bilateral calf pain.  Limitations: Body habitus, poor ultrasound/tissue interface and patient discomfort. Comparison Study: No prior study Performing Technologist: Maudry Mayhew MHA, RDMS, RVT, RDCS  Examination Guidelines: A complete evaluation includes B-mode imaging, spectral Doppler, color Doppler, and power Doppler as needed of all accessible portions of each vessel. Bilateral testing is considered an integral part of a complete examination. Limited examinations for reoccurring indications may be performed as noted. The reflux portion of the exam is performed with the patient in reverse  Trendelenburg.  +---------+---------------+---------+-----------+----------+--------------+ RIGHT    CompressibilityPhasicitySpontaneityPropertiesThrombus Aging +---------+---------------+---------+-----------+----------+--------------+ FV Prox  Full                    Yes        Patent                   +---------+---------------+---------+-----------+----------+--------------+ FV Mid                           Yes                                 +---------+---------------+---------+-----------+----------+--------------+ FV DistalFull                                                        +---------+---------------+---------+-----------+----------+--------------+ PFV      Full                    Yes                                 +---------+---------------+---------+-----------+----------+--------------+ POP      Full           No       Yes                                 +---------+---------------+---------+-----------+----------+--------------+ PTV      Full                    Yes                                 +---------+---------------+---------+-----------+----------+--------------+ PERO     Full                    Yes                                 +---------+---------------+---------+-----------+----------+--------------+  Right Technical Findings: Not visualized segments include SFJ, CFV.  +---------+---------------+---------+-----------+----------+--------------+ LEFT     CompressibilityPhasicitySpontaneityPropertiesThrombus Aging +---------+---------------+---------+-----------+----------+--------------+ CFV      Full           No       Yes                                 +---------+---------------+---------+-----------+----------+--------------+ FV Prox  Full                                                        +---------+---------------+---------+-----------+----------+--------------+ FV Mid   Full                                                         +---------+---------------+---------+-----------+----------+--------------+ FV Distal                        Yes                                 +---------+---------------+---------+-----------+----------+--------------+ POP      Full           No       Yes                                 +---------+---------------+---------+-----------+----------+--------------+ PTV      Full                                                        +---------+---------------+---------+-----------+----------+--------------+ PERO     Full                                                        +---------+---------------+---------+-----------+----------+--------------+   Left Technical Findings: Not visualized segments include SFJ, PFV, limited evaluation of PTV and peroneal veins.   Summary: RIGHT: - There is no evidence of deep vein thrombosis in the lower extremity. However, portions of this examination were limited- see technologist comments above.  - No cystic structure found in the popliteal fossa.  LEFT: - There is no evidence of deep vein thrombosis in the lower extremity. However, portions of this examination were limited- see technologist comments above.  - No cystic structure found in the popliteal fossa.  Bilateral lower extremity venous flow is pulsatile, suggestive of possibly elevated right heart pressure.  *See table(s) above for measurements and observations. Electronically signed by Monica Martinez MD on 11/12/2019 at 5:07:25 PM.    Final     Microbiology: Recent Results (from the past 240 hour(s))  Respiratory Panel by RT PCR (Flu A&B, Covid) - Nasopharyngeal Swab     Status:  None   Collection Time: 11/09/19  7:37 PM   Specimen: Nasopharyngeal Swab  Result Value Ref Range Status   SARS Coronavirus 2 by RT PCR NEGATIVE NEGATIVE Final    Comment: (NOTE) SARS-CoV-2 target nucleic acids are NOT DETECTED.  The SARS-CoV-2 RNA is generally  detectable in upper respiratoy specimens during the acute phase of infection. The lowest concentration of SARS-CoV-2 viral copies this assay can detect is 131 copies/mL. A negative result does not preclude SARS-Cov-2 infection and should not be used as the sole basis for treatment or other patient management decisions. A negative result may occur with  improper specimen collection/handling, submission of specimen other than nasopharyngeal swab, presence of viral mutation(s) within the areas targeted by this assay, and inadequate number of viral copies (<131 copies/mL). A negative result must be combined with clinical observations, patient history, and epidemiological information. The expected result is Negative.  Fact Sheet for Patients:  PinkCheek.be  Fact Sheet for Healthcare Providers:  GravelBags.it  This test is no t yet approved or cleared by the [place] FDA and  has been authorized for detection and/or diagnosis of SARS-CoV-2 by FDA under an Emergency Use Authorization (EUA). This EUA will remain  in effect (meaning this test can be used) for the duration of the COVID-19 declaration under Section 564(b)(1) of the Act, 21 U.S.C. section 360bbb-3(b)(1), unless the authorization is terminated or revoked sooner.     Influenza A by PCR NEGATIVE NEGATIVE Final   Influenza B by PCR NEGATIVE NEGATIVE Final    Comment: (NOTE) The Xpert Xpress SARS-CoV-2/FLU/RSV assay is intended as an aid in  the diagnosis of influenza from Nasopharyngeal swab specimens and  should not be used as a sole basis for treatment. Nasal washings and  aspirates are unacceptable for Xpert Xpress SARS-CoV-2/FLU/RSV  testing.  Fact Sheet for Patients: PinkCheek.be  Fact Sheet for Healthcare Providers: GravelBags.it  This test is not yet approved or cleared by the [place] FDA and   has been authorized for detection and/or diagnosis of SARS-CoV-2 by  FDA under an Emergency Use Authorization (EUA). This EUA will remain  in effect (meaning this test can be used) for the duration of the  Covid-19 declaration under Section 564(b)(1) of the Act, 21  U.S.C. section 360bbb-3(b)(1), unless the authorization is  terminated or revoked. Performed at [facility], [address] [place], [place] [address]   Urine culture     Status: Abnormal   Collection Time: 11/09/19  7:37 PM   Specimen: Urine, Random  Result Value Ref Range Status   Specimen Description   Final    URINE, RANDOM Performed at [facility], [address] [place], [place] [address]    Special Requests   Final    NONE Performed at [facility], [address] [place], [place] [address]    Culture >=100,000 COLONIES/mL ESCHERICHIA COLI (A)  Final   Report Status 11/11/2019 FINAL  Final   Organism ID, Bacteria ESCHERICHIA COLI (A)  Final      Susceptibility   Escherichia coli - MIC*    AMPICILLIN >=32 RESISTANT Resistant     CEFAZOLIN <=4 SENSITIVE Sensitive     CEFTRIAXONE <=0.25 SENSITIVE Sensitive     CIPROFLOXACIN >=4 RESISTANT Resistant     GENTAMICIN <=1 SENSITIVE Sensitive     IMIPENEM <=0.25 SENSITIVE Sensitive     NITROFURANTOIN <=16 SENSITIVE Sensitive     TRIMETH/SULFA >=320 RESISTANT Resistant  AMPICILLIN/SULBACTAM 16 INTERMEDIATE Intermediate     PIP/TAZO <=4 SENSITIVE Sensitive     * >=100,000 COLONIES/mL ESCHERICHIA COLI  Culture, blood (routine x 2)     Status: None   Collection Time: 11/09/19  8:01 PM   Specimen: BLOOD RIGHT HAND  Result Value Ref Range Status   Specimen Description   Final    BLOOD RIGHT HAND Performed at [facility], [address] [place], [place] [address]    Special Requests   Final    BOTTLES DRAWN AEROBIC AND ANAEROBIC Blood Culture results may not be optimal due to an inadequate volume of blood received  in culture bottles Performed at [facility], [address] [place], [place] [address]    Culture   Final    NO GROWTH 5 DAYS Performed at [facility], [place] [address], [place], [place] [address]    Report Status 11/14/2019 FINAL  Final  Culture, blood (routine x 2)     Status: None   Collection Time: 11/09/19  8:10 PM   Specimen: Right Antecubital; Blood  Result Value Ref Range Status   Specimen Description   Final    RIGHT ANTECUBITAL Performed at [facility], [address] [place], [place] [address]    Special Requests   Final    BOTTLES DRAWN AEROBIC AND ANAEROBIC Blood Culture adequate volume Performed at [facility] [address], [place], [place] [address]    Culture   Final    NO GROWTH 5 DAYS Performed at [facility], [place] [address], [place], [place] [address]    Report Status 11/14/2019 FINAL  Final  Aerobic/Anaerobic Culture (surgical/deep wound)     Status: None   Collection Time: 11/10/19  3:30 AM   Specimen: Abscess  Result Value Ref Range Status   Specimen Description   Final    ABSCESS Performed at [place] [address], [place], [place] [address]    Special Requests   Final    NONE Performed at [facility], [place] [address], [place], [place] [address]    Gram Stain   Final    FEW WBC SEEN FEW GRAM POSITIVE COCCI Performed at [facility], [place] [address], [place], [place] [address]    Culture   Final    RARE ESCHERICHIA COLI RARE VIRIDANS STREPTOCOCCUS RARE BACTEROIDES FRAGILIS RARE BACTEROIDES OVATUS BETA LACTAMASE POSITIVE Performed at [facility], [place] [address] [place], [place] [address]    Report Status 11/13/2019 FINAL  Final   Organism ID, Bacteria ESCHERICHIA COLI  Final   Organism ID, Bacteria VIRIDANS STREPTOCOCCUS  Final      Susceptibility   Escherichia coli - MIC*    AMPICILLIN >=32 RESISTANT Resistant      CEFAZOLIN <=4 SENSITIVE Sensitive     CEFEPIME <=0.12 SENSITIVE Sensitive     CEFTAZIDIME <=1 SENSITIVE Sensitive     CEFTRIAXONE <=0.25 SENSITIVE Sensitive     CIPROFLOXACIN >=4 RESISTANT Resistant     GENTAMICIN <=1 SENSITIVE Sensitive     IMIPENEM <=0.25 SENSITIVE Sensitive     TRIMETH/SULFA >=320 RESISTANT Resistant     AMPICILLIN/SULBACTAM 16 INTERMEDIATE Intermediate     PIP/TAZO <=4 SENSITIVE Sensitive     * RARE ESCHERICHIA COLI   Viridans streptococcus - MIC*    PENICILLIN <=0.06 SENSITIVE Sensitive     CEFTRIAXONE 0.25 SENSITIVE Sensitive  ERYTHROMYCIN <=0.12 SENSITIVE Sensitive     LEVOFLOXACIN 0.5 SENSITIVE Sensitive     VANCOMYCIN 0.5 SENSITIVE Sensitive     * RARE VIRIDANS STREPTOCOCCUS  MRSA PCR Screening     Status: None   Collection Time: 11/10/19  4:00 AM   Specimen: Nasopharyngeal  Result Value Ref Range Status   MRSA by PCR NEGATIVE NEGATIVE Final    Comment:        The GeneXpert MRSA Assay (FDA approved for NASAL specimens only), is one component of a comprehensive MRSA colonization surveillance program. It is not intended to diagnose MRSA infection nor to guide or monitor treatment for MRSA infections. Performed at [facility], [place] [address], [place], [place] [address]      Labs: Basic Metabolic Panel: Recent Labs  Lab 11/09/19 1833 11/10/19 0958 11/11/19 0647 11/12/19 0605 11/13/19 0549  NA 132* 130* 133* 132* 132*  K 3.3* 3.3* 3.4* 3.8 4.0  CL 94* 96* 101 101 103  CO2 21* 23 21* 21* 16*  GLUCOSE 259* 231* 165* 133* 127*  BUN 19 18 19 18 15   CREATININE 1.50* 1.22 1.14 1.09 0.98  CALCIUM 9.3 8.7* 8.4* 8.8* 8.3*  MG  --  1.8  --  1.9 1.8   Liver Function Tests: Recent Labs  Lab 11/09/19 1833 11/10/19 0958 11/11/19 0647  AST 25 16 16   ALT 15 14 12   ALKPHOS 92 74 70  BILITOT 1.4* 1.4* 1.2  PROT 6.8 5.8* 5.4*  ALBUMIN 3.3* 2.7* 2.6*   Recent Labs  Lab 11/09/19 1833  LIPASE 22   No results for input(s):  AMMONIA in the last 168 hours. CBC: Recent Labs  Lab 11/09/19 1833 11/10/19 0958 11/11/19 0647 11/12/19 0605 11/13/19 0549  WBC 16.7* 16.3* 15.1* 10.3 7.9  NEUTROABS 15.3* 15.3* 13.5* 8.8* 6.2  HGB 16.3 14.3 13.7 14.4 14.3  HCT 46.7 42.3 40.9 43.7 41.6  MCV 90.9 93.8 94.7 95.0 92.0  PLT 277 223 211 235 162   Cardiac Enzymes: No results for input(s): CKTOTAL, CKMB, CKMBINDEX, TROPONINI in the last 168 hours. BNP: BNP (last 3 results) No results for input(s): BNP in the last 8760 hours.  ProBNP (last 3 results) No results for input(s): PROBNP in the last 8760 hours.  CBG: Recent Labs  Lab 11/14/19 1140 11/14/19 1651 11/14/19 2101 11/15/19 0729 11/15/19 1137  GLUCAP 138* 127* 148* 114* 139*       Signed:  Kayleen Memos, MD [facility] 11/15/2019, 1:11 PM

## 2019-11-16 DIAGNOSIS — K611 Rectal abscess: Secondary | ICD-10-CM | POA: Diagnosis not present

## 2019-11-16 LAB — GLUCOSE, CAPILLARY
Glucose-Capillary: 114 mg/dL — ABNORMAL HIGH (ref 70–99)
Glucose-Capillary: 164 mg/dL — ABNORMAL HIGH (ref 70–99)
Glucose-Capillary: 124 mg/dL — ABNORMAL HIGH (ref 70–99)
Glucose-Capillary: 188 mg/dL — ABNORMAL HIGH (ref 70–99)

## 2019-11-16 NOTE — Progress Notes (Signed)
Patient refusing to get up and do sitz bath, ask mulriple times today states I want to do it later. Education done with patient on importance. Bethann Punches RN

## 2019-11-16 NOTE — Progress Notes (Signed)
PT Cancellation Note  Patient Details Name: Gary Butler MRN: [id number] DOB: Sep 05, 1945   Cancelled Treatment:    Reason Eval/Treat Not Completed: Patient declined, no reason specified. Pt declines therapy at this time "come back after the Price is Right". Therapist educated pt on purpose of PT interventions and pt happily accepts to participate at noon after the show. Will check back as schedule permits.    Tori Wynee Matarazzo PT, DPT 11/16/19, 11:05 AM

## 2019-11-16 NOTE — Progress Notes (Signed)
Despite encouragement and education, patient refused sitz bath and ambulation last night. Patient reported he would do sitz/ ambulation in am. Roderick Pee

## 2019-11-16 NOTE — Progress Notes (Signed)
Physical Therapy Treatment Patient Details Name: Gary Butler MRN: [id number] DOB: 22-Jan-1946 Today's Date: 11/16/2019    History of Present Illness Pt s/p I&D of scrotal abscess and with hx of DM, Psoriatic Arthritis, Bil TKR, early dementia and chronic back pain    PT Comments    Pt requiring min A to upright trunk and come to sitting at EOB requiring significant increased time, using specific sequencing with guarded response to therapist's cues for supine to sit transfer. Pt able to stand and transfer over to [facility] without physical assistance using RW to steady self. Therapist provided pericare due to pt requiring RW for steadying with static standing. Pt able to take a few sidesteps in room with RW, but declines further ambulation due to R lateral thigh pain that is "tender and sharp". Pt questionable if family able to assist him due to current functional status; updated d/c recommendation to SNF due to assistance level needed. RN notified of session.    Follow Up Recommendations  SNF     Equipment Recommendations  None recommended by PT    Recommendations for Other Services       Precautions / Restrictions Precautions Precautions: Fall Restrictions Weight Bearing Restrictions: No    Mobility  Bed Mobility Overal bed mobility: Needs Assistance Bed Mobility: Supine to Sit;Sit to Supine  Supine to sit: Min assist;HOB elevated Sit to supine: Min assist   General bed mobility comments: min A to upright trunk with HOB elevated and use of bedrail, assist to lift BLE back into bed when returning to supine, significant increased time with mobility  Transfers Overall transfer level: Needs assistance Equipment used: Rolling walker (2 wheeled) Transfers: Sit to/from [facility] Sit to Stand: Min guard Stand pivot transfers: Min guard    General transfer comment: BUE assisting to power up with BLE braced on bed, increased time due to pain  complaints  Ambulation/Gait  General Gait Details: pt declined to ambulate due to RLE pain, pt tolerates a few sidesteps in room with RW and min guard assist   Stairs             Wheelchair Mobility    Modified Rankin (Stroke Patients Only)       Balance Overall balance assessment: Needs assistance Sitting-balance support: Feet supported;No upper extremity supported Sitting balance-Leahy Scale: Good Sitting balance - Comments: seated EOB   Standing balance support: During functional activity;Bilateral upper extremity supported Standing balance-Leahy Scale: Poor Standing balance comment: UE support       Cognition Arousal/Alertness: Awake/alert Behavior During Therapy: WFL for tasks assessed/performed Overall Cognitive Status: Within Functional Limits for tasks assessed           Exercises      General Comments General comments (skin integrity, edema, etc.): on RA with SpO2 93-97% with mobility      Pertinent Vitals/Pain Pain Assessment: 0-10 Pain Score: 7  Pain Location: R lateral thigh Pain Descriptors / Indicators: Tender;Sharp Pain Intervention(s): Limited activity within patient's tolerance;Monitored during session;Repositioned    Home Living                      Prior Function            PT Goals (current goals can now be found in the care plan section) Acute Rehab PT Goals Patient Stated Goal: Regain IND PT Goal Formulation: With patient Time For Goal Achievement: 11/26/19 Potential to Achieve Goals: Good Progress towards PT goals: Progressing toward goals  Frequency    Min 3X/week      PT Plan Discharge plan needs to be updated    Co-evaluation              AM-PAC PT "6 Clicks" Mobility   Outcome Measure  Help needed turning from your back to your side while in a flat bed without using bedrails?: A Little Help needed moving from lying on your back to sitting on the side of a flat bed without using bedrails?: A  Little Help needed moving to and from a bed to a chair (including a wheelchair)?: A Little Help needed standing up from a chair using your arms (e.g., wheelchair or bedside chair)?: A Little Help needed to walk in hospital room?: A Lot Help needed climbing 3-5 steps with a railing? : A Lot 6 Click Score: 16    End of Session   Activity Tolerance: Patient limited by pain Patient left: in bed;with call bell/phone within reach;with nursing/sitter in room Nurse Communication: Mobility status PT Visit Diagnosis: Unsteadiness on feet (R26.81);Difficulty in walking, not elsewhere classified (R26.2)     Time: 7092-9574 PT Time Calculation (min) (ACUTE ONLY): 20 min  Charges:  $Therapeutic Activity: 8-22 mins                     Tori Morningstar Toft PT, DPT 11/16/19, 1:31 PM

## 2019-11-16 NOTE — Care Management Important Message (Signed)
Important Message  Patient Details IM Letter given to the Patient Name: Bryston Colocho MRN: [id number] Date of Birth: 02-Jun-1945   Medicare Important Message Given:  Yes     Kerin Salen 11/16/2019, 12:13 PM

## 2019-11-16 NOTE — Discharge Summary (Signed)
Discharge Summary  Gary Butler XTG:[id number] DOB: Jul 22, 1945  PCP: [name], MD  Admit date: 11/09/2019 Discharge date: 11/16/2019  Time spent: 35 minutes  Recommendations for Outpatient Follow-up:  1. Follow-up with general surgery 2. Follow-up with urology for a voiding trial. 3. Follow-up with cardiology. 4. Follow-up with your PCP 5. Take your medication as prescribed 6. Continue PT OT with assistance and fall precautions.  Discharge Diagnoses:  Active Hospital Problems   Diagnosis Date Noted  . Suprasphincteric perirectal abscess s/p I&D 11/10/2019 11/10/2019  . Acute urinary retention s/p Foley 11/10/2019 11/10/2019  . Diarrhea 11/10/2019  . Perineal abscess 11/10/2019  . GERD without esophagitis 11/10/2019  . Atrial fibrillation ([place]) 11/10/2019  . Uncontrolled type 2 diabetes mellitus with hyperglycemia, with long-term current use of insulin ([place]) 12/27/2014  . Essential hypertension 12/27/2014  . Obstructive sleep apnea 04/04/2009    Resolved Hospital Problems  No resolved problems to display.    Discharge Condition: Stable  Diet recommendation: Heart healthy carb modified diet.  Vitals:   11/15/19 2126 11/16/19 0551  BP: 105/61 110/62  Pulse: 65 (!) 59  Resp: 20   Temp: 98.1 F (36.7 C) 97.7 F (36.5 C)  SpO2: 99% 95%    History of present illness:  HPI:74 year old male with past medical history of OSA on BIPAP, severe obesity, chronic dCHF, hyperlipidemia, hypertension, diabetes mellitus type 2, gastroesophageal reflux disease and pertinent past medical history of a left-sided perianal fistula status post fistulotomy performed at [facility] in 2019. Patient presented to [facility] emergency department with complaints of frequent bouts of watery diarrhea and rectal pain.  Patient explains that for at least the past 6 weeks he has been experiencing what he describes as explosive bouts of watery diarrhea.  Associated with progressively  worsening rectal discomfort and frequent bouts of vomiting and inability to tolerate oral intake for the past several days.  Underwent recent EGD approximately 1 week prior to presentation with esophageal dilation.  Presented to [facility] emergency department at the direction of his primary care provider.  At [facility],  CT abdomen and pelvis revealed evidence of abscess formation of 3.4 x 2.5 cm just below the base of the penis. There was initially concern for Fournier's gangrene and therefore after discussion with urology and general surgery patient was transferred to the [facility] long ED for further evaluation and management.   Work up revealed perineal abscess without Fournier's. Patient was taken to the operating room at [facility] long by Dr. Johney Maine for an incision and drainage. TR[facility] was asked to admit.  Hospital course complicated by acute urinary retention.  His bladder was extremely distended on CT scan.  Foley catheter was placed on 11/10/2019 and removed on 11/13/2019.  He failed a voiding trial and a Foley catheter was replaced on 11/14/19.  Discussed with Dr. Jacalyn Lefevre of urology, will follow up outpatient for a voiding trial in the office.  Recommended to continue Flomax.  11/16/19:  No acute events overnight.  No new complaints.  Lab studies and vital signs reviewed and are stable.    Hospital Course:  Principal Problem:   Suprasphincteric perirectal abscess s/p I&D 11/10/2019 Active Problems:   Obstructive sleep apnea   Essential hypertension   Uncontrolled type 2 diabetes mellitus with hyperglycemia, with long-term current use of insulin (HCC)   Acute urinary retention s/p Foley 11/10/2019   Diarrhea   Perineal abscess   GERD without esophagitis   Atrial fibrillation (  [place])  Sepsis, resolved, 2/2 Suprasphincteric perirectal abscess s/p I&D 11/10/2019 by Dr. Johney Maine, E-coli UTI  Patient is status post successful incision and drainage by Dr. Johney Maine  Surgery  following and assisting with education on dressing changes  Wound culture grew rare e-coli and rare viridans streptococcus, also rare BACTEROIDES FRAGILIS, RARE BACTEROIDES OVATUS, BETA LACTAMASE POSITIVE, FINAL.  Switch Rocephin and IV flagyl to keflex and po flagyl on 11/13/2019.  Patient has a history of a perianal fistula requiring fistulotomy in 2019 at [facility]. The appearance of the abscess is concerning for the development of another fistula.  Blood cultures no growth to date  Pain control.  We appreciate surgery's assistance  Follow-up with surgery outpatient.  Acute urinary retention Urinary retention post op, Foley catheter placed on 11/10/2019 Foley catheter was removed on 11/13/2019, unable to void, in and out cath revealed 900 cc urine output. Foley catheter re-placed on 11/14/19 after failing voiding trials, patient will need to follow-up with urology outpatient with voiding trial in the office, discussed with Dr. Claudia Desanctis Continue Flomax Follow-up with urology outpatient.  New onset atrial fibrillation  Patient noted to be in atrial fibrillation on 11/09/19, in the setting of sepsis.  TSH normal 1.1, troponin within normal limit 15  2D echo done on 11/11/2019 showed LVEF 60 to 65% with grade 2 diastolic dysfunction  Chadsvasc score of 3  Cardiology consulted to advise on [facility]  He is on home Bisoprolol, this has controlled his rate  Added telemetry monitoring  Started DOAC Eliquis on 9/30  Will follow up with Afib clinic/cardiology outpatient  Resolving E. coli UTI Urine culture growing greater than 100,000 colonies of E. coli with resistance to ampicillin, intermediate resistance to ampicillin/sulbactam, ciprofloxacin, and Bactrim.  Osteoarthritis/severe lower extremity tenderness with palpation.  Ruled out DVT with B/L doppler US Continue home regimen Follow-up with your PCP  History of gout On allopurinol.  Diarrhea, improving  Patient reports  6 to 8-week history of diarrhea, etiology unclear  Had stool studies performed by his primary care provider a week or so ago that were negative for C. Difficile  No enteric precautions required  Follow-up with your PCP  Essential hypertension BPs are soft Currently on bisoprolol 2.5 mg daily  Maxzide and Telmesartan help to avoid hypotension Resume home Lasix and potassium replacement. Follow-up with your PCP  Uncontrolled type 2 diabetes mellitus with hyperglycemia,  Recent hemoglobin A1c was found to be elevated at 9.1 on 10/26/19  Resume home regimen.  Follow-up with your PCP  Severe morbid obesity BMI 42 Recommend weight loss outpatient with regular physical activity and healthy dieting Will benefit from bariatric surgery for weight loss evaluation  Physical debility PT recommended home health PT OT Continue PT OT with assistance and fall precaution.     Code Status:Full code    Consultants:  General surgery  Cardiology  Procedures:  Post I&D  Antimicrobials:  IV vancomycin, completed  Cefepime, completed  IV Flagyl, completed  Keflex, po flagyl 11/13/19>>11/24/19.   Discharge Exam: BP 110/62 (BP Location: Right Arm)   Pulse (!) 59   Temp 97.7 F (36.5 C) (Oral)   Resp 20   Ht 5' 10.98" (1.803 m)   Wt (!) 137.4 kg   SpO2 95%   BMI 42.27 kg/m  . General: 74 y.o. year-old male well developed well nourished in no acute distress.  Alert and oriented x3. . Cardiovascular: Regular rate and rhythm with no rubs or gallops.  No thyromegaly or JVD noted.   Marland Kitchen  Respiratory: Clear to auscultation with no wheezes or rales. Good inspiratory effort. . Abdomen: Soft nontender nondistended with normal bowel sounds x4 quadrants. . Musculoskeletal: No lower extremity edema bilaterally. Marland Kitchen Psychiatry: Mood is appropriate for condition and setting  Discharge Instructions You were cared for by a hospitalist during your hospital stay. If you  have any questions about your discharge medications or the care you received while you were in the hospital after you are discharged, you can call the unit and asked to speak with the hospitalist on call if the hospitalist that took care of you is not available. Once you are discharged, your primary care physician will handle any further medical issues. Please note that NO REFILLS for any discharge medications will be authorized once you are discharged, as it is imperative that you return to your primary care physician (or establish a relationship with a primary care physician if you do not have one) for your aftercare needs so that they can reassess your need for medications and monitor your lab values.   Allergies as of 11/16/2019      Reactions   Penicillins Hives   Patient reports full body hives that required medical treatment when he was in his 52s or 69s. He tolerated cephalosporins.    Lyrica [pregabalin] Swelling   Levofloxacin Swelling      Medication List    STOP taking these medications   etodolac 400 MG tablet Commonly known as: LODINE   Myrbetriq 25 MG Tb24 tablet Generic drug: mirabegron ER   ondansetron 8 MG tablet Commonly known as: ZOFRAN   telmisartan 40 MG tablet Commonly known as: MICARDIS   triamterene-hydrochlorothiazide 75-50 MG tablet Commonly known as: MAXZIDE     TAKE these medications   Albuterol Sulfate 108 (90 Base) MCG/ACT Aepb Commonly known as: ProAir RespiClick Inhale 2 puffs into the lungs every 6 (six) hours as needed (for wheezing or shortness of breath).   allopurinol 300 MG tablet Commonly known as: ZYLOPRIM TAKE 1 TABLET(300 MG) BY MOUTH DAILY What changed: See the new instructions.   apixaban 5 MG Tabs tablet Commonly known as: ELIQUIS Take 1 tablet (5 mg total) by mouth 2 (two) times daily.   aspirin 81 MG tablet Take 81 mg by mouth daily.   atorvastatin 40 MG tablet Commonly known as: LIPITOR TAKE 1 TABLET(40 MG) BY MOUTH  EVERY EVENING What changed:   how much to take  how to take this  when to take this  additional instructions   B-12 2000 MCG Tabs Take 1 tablet by mouth daily.   bisoprolol 5 MG tablet Commonly known as: ZEBETA Take 0.5 tablets (2.5 mg total) by mouth daily. What changed:   how much to take  how to take this  when to take this  additional instructions   cephALEXin 500 MG capsule Commonly known as: KEFLEX Take 2 capsules (1,000 mg total) by mouth 3 (three) times daily for 10 days.   cetirizine 10 MG tablet Commonly known as: ZYRTEC TAKE 1 TABLET(10 MG) BY MOUTH DAILY What changed:   how much to take  how to take this  when to take this  additional instructions   donepezil 10 MG tablet Commonly known as: Aricept Take 1 tablet (10 mg total) by mouth at bedtime.   fluocinonide cream 0.05 % Commonly known as: LIDEX   fluticasone 50 MCG/ACT nasal spray Commonly known as: FLONASE Place 2 sprays into both nostrils daily. What changed:   when to take this  reasons to take this   folic acid 1 MG tablet Commonly known as: FOLVITE Take 1 mg by mouth daily.   furosemide 20 MG tablet Commonly known as: LASIX TAKE 1 TABLET(20 MG) BY MOUTH DAILY What changed:   how much to take  how to take this  when to take this  additional instructions   Jardiance 10 MG Tabs tablet Generic drug: empagliflozin Take 10 mg by mouth daily before breakfast.   meloxicam 15 MG tablet Commonly known as: MOBIC Take 1 tablet by mouth daily.   metroNIDAZOLE 500 MG tablet Commonly known as: FLAGYL Take 1 tablet (500 mg total) by mouth every 8 (eight) hours for 10 days.   nystatin ointment Commonly known as: MYCOSTATIN Apply 1 application topically 2 (two) times daily. What changed:   when to take this  reasons to take this   omeprazole 40 MG capsule Commonly known as: PRILOSEC TAKE 1 CAPSULE BY MOUTH 30 TO 60 MINUTES BEFORE YOUR FIRST AND LAST MEAL OF THE  DAY What changed:   how much to take  how to take this  when to take this  additional instructions   polyethylene glycol 17 g packet Commonly known as: MIRALAX / GLYCOLAX Take 17 g by mouth daily as needed for mild constipation.   potassium chloride 10 MEQ CR capsule Commonly known as: MICRO-K TAKE 1 CAPSULE BY MOUTH EVERY DAY What changed:   how much to take  how to take this  when to take this  additional instructions   psyllium 95 % Pack Commonly known as: HYDROCIL/METAMUCIL Take 1 packet by mouth 2 (two) times daily.   sertraline 50 MG tablet Commonly known as: ZOLOFT TAKE 1 TABLET(50 MG) BY MOUTH DAILY. What changed:   how much to take  how to take this  when to take this  additional instructions   tamsulosin 0.4 MG Caps capsule Commonly known as: FLOMAX Take 1 capsule (0.4 mg total) by mouth daily after supper.   tiZANidine 4 MG tablet Commonly known as: ZANAFLEX Take 1 tablet (4 mg total) by mouth every 6 (six) hours as needed for muscle spasms.   UNABLE TO FIND Med Name: BiPap with [employer]   Vitamin D 50 MCG (2000 UT) tablet Take 2,000 Units by mouth daily.      Allergies  Allergen Reactions  . Penicillins Hives    Patient reports full body hives that required medical treatment when he was in his 45s or 58s. He tolerated cephalosporins.   [name] [Pregabalin] Swelling  . Levofloxacin Swelling    Follow-up Information    Michael Boston, MD Follow up on 11/26/2019.   Specialty: General Surgery Why: 11:00am, arrive by 10:30am for paperwork and check in process Contact information: [place] [address] [phone]        Malka So R, [place] Follow up.   Specialty: Cardiology Why: Follow-up visit in [facility] scheduled for 11/23/2019 at 2pm. Parking code for October is 3009. You can enter through entrance C. Contact information: [place] [address] [phone]         [name], MD. Call in 1 day(s).   Specialty: Family Medicine Why: Please call for a post hospital follow up appointment Contact information: [address] [place] [address] [phone]        Troy Sine, MD .   Specialty: Cardiology Contact information: [address] [place]        Robley Fries, MD. Call in 1 day(s).   Specialty: Urology Why: Please call for a post hospital follow up appointment Contact information: [address] [place] [address] [phone]        [facility] Follow up.   Specialty: Home Health Services Contact information: [place] [address] [phone]                The results of significant diagnostics from this hospitalization (including imaging, microbiology, ancillary and laboratory) are listed below for reference.    Significant Diagnostic Studies: MR Brain Wo Contrast  Result Date: 10/29/2019 CLINICAL DATA:  Memory loss.  Dementia. EXAM: MRI HEAD WITHOUT CONTRAST TECHNIQUE: Multiplanar, multiecho pulse sequences of the brain and surrounding structures were obtained without intravenous contrast. COMPARISON:  02/28/2006 FINDINGS: Brain: Generalized atrophy with relative frontal and temporal predominance. Diffusion imaging does not show any acute or subacute infarction. Mild chronic small-vessel ischemic changes affect pons. No focal cerebellar finding. Cerebral hemispheres show moderate chronic small-vessel ischemic change of the white matter. No cortical or large vessel territory infarction. No mass lesion, hemorrhage, hydrocephalus or extra-axial collection. Vascular: Major vessels at the base of the brain show flow. Skull and upper cervical spine: Negative Sinuses/Orbits: Clear/normal Other: None IMPRESSION: No acute or reversible finding. Generalized atrophy with relative frontal and temporal  predominance. Moderate chronic small-vessel ischemic changes of the cerebral hemispheric white matter. Electronically Signed   By: Nelson Chimes M.D.   On: 10/29/2019 10:53   CT Abdomen Pelvis W Contrast  Result Date: 11/09/2019 CLINICAL DATA:  Bowel obstruction suspected vomiting since esophageal dilation, unable to tolerate PO Increasing weakness. EXAM: CT ABDOMEN AND PELVIS WITH CONTRAST TECHNIQUE: Multidetector CT imaging of the abdomen and pelvis was performed using the standard protocol following bolus administration of intravenous contrast. CONTRAST:  72mL OMNIPAQUE IOHEXOL 300 MG/ML  SOLN COMPARISON:  CT 11 days ago 10/29/2019 FINDINGS: Lower chest: Again seen subpleural fat at the lung bases. No pleural fluid. No lower pneumomediastinum or esophageal wall thickening. No focal consolidation. Hepatobiliary: Decreased hepatic density consistent with steatosis. Previous geographic fatty distribution is more homogeneous on the current exam, likely related to differences in contrast timing. There is no discrete focal hepatic lesion. Layering gallstones without pericholecystic inflammation. There is no biliary dilatation. No visualized choledocholithiasis. Pancreas: Fatty atrophy.  No ductal dilatation or inflammation. Spleen: Normal in size without focal abnormality. Adrenals/Urinary Tract: Normal right adrenal gland. Punctate left adrenal calcification unchanged. No hydronephrosis or perinephric edema. Again seen thinning of bilateral renal parenchyma. There are bilateral renal cysts are unchanged since recent exam. The urinary bladder is distended. No bladder wall thickening. Bladder(volume = 1580 cm^3). Stomach/Bowel: No distal esophageal wall thickening. Stomach is decompressed. Normal positioning of the duodenum and ligament of Treitz. Decompressed small bowel without obstruction or inflammation. Normal appendix. Small volume of stool throughout the colon. Sigmoid colon is tortuous. Distal colonic  diverticulosis without focal diverticulitis. There is no colonic wall thickening or inflammation. Vascular/Lymphatic: Aorto bi-iliac atherosclerosis without aortic aneurysm. The portal vein is patent. No acute vascular finding. No abdominopelvic adenopathy. Reproductive: Normal sized prostate gland, see details below regarding adjacent soft tissue air. Other: There is edema and soft tissue gas involving the left perineum extending to the base of the penis. Small perineal fluid collection measures 3.4 x 2.5 cm, series 2, image 87. Tracking soft tissue air extends superiorly along the inferior margin  of the prostate gland. No abdominopelvic ascites. Musculoskeletal: Multilevel degenerative change throughout the spine. Degenerative change of the hips. No bony destruction or acute osseous abnormality. IMPRESSION: 1. No bowel obstruction. 2. Midline perineal abscess measuring 3.4 x 2.5 cm just below the base of the penis anterior to the rectum, with patchy soft tissue air tracking in the left perineum, into the subcutaneous tissues. Findings highly suspicious for Fournier's gangrene. 3. Distended urinary bladder without bladder wall thickening. 4. Hepatic steatosis. Cholelithiasis without gallbladder inflammation. 5. Colonic diverticulosis without diverticulitis. Aortic Atherosclerosis (ICD10-I70.0). These results were called by telephone at the time of interpretation on 11/09/2019 at 9:17 pm to Dr Apolonio Schneiders LITTLE , who verbally acknowledged these results. Electronically Signed   By: Keith Rake M.D.   On: 11/09/2019 21:18   CT Abdomen Pelvis W Contrast  Result Date: 10/29/2019 CLINICAL DATA:  Diarrhea and shortness of breath for 6 weeks. EXAM: CT ABDOMEN AND PELVIS WITH CONTRAST TECHNIQUE: Multidetector CT imaging of the abdomen and pelvis was performed using the standard protocol following bolus administration of intravenous contrast. CONTRAST:  161mL OMNIPAQUE IOHEXOL 300 MG/ML  SOLN COMPARISON:  Abdominal  ultrasound 05/19/2012, 06/05/2011 FINDINGS: Lower chest: Subpleural fat noted in the lung bases. Few bandlike areas of opacity, likely scarring or atelectasis. Lung bases are otherwise clear. Abundant mediastinal and pericardial fat noted as well. Cardiac size is within normal limits. No pericardial effusion. Hepatobiliary: Geographic regions of hypoattenuation throughout the liver likely reflect areas of hepatic steatosis/fatty infiltration. No focal concerning liver lesion. Smooth liver surface contour. Gallbladder contains multiple dependently layering calcified gallstones. No gallbladder wall thickening, pericholecystic fluid or inflammation. No visible calcified intraductal gallstones or biliary dilatation. Pancreas: Partial fatty replacement of the pancreas. No pancreatic ductal dilatation or surrounding inflammatory changes. Spleen: No frank splenomegaly.  No concerning splenic lesions. Adrenals/Urinary Tract: Calcification of the left adrenal gland could reflect sequela of prior infection or hemorrhage. No concerning adrenal lesion. Bilateral renal atrophy/cortical thinning. Multiple fluid attenuation cysts are present in both kidneys, largest is seen in the right kidney measuring up to 7.1 cm in the upper pole. No concerning focal renal lesions. No urolithiasis or hydronephrosis. Moderate bladder distension without other gross bladder abnormality. Stomach/Bowel: Distal esophagus, stomach and duodenum are unremarkable. No small bowel thickening or dilatation. A normal appendix is visualized in the right lower quadrant. No proximal colonic thickening or dilatation. Numerous distal colonic diverticula and a segment of circumferentially thickened versus decompressed colon seen in the proximal sigmoid in a region of these diverticular but without acute pericolonic inflammation to suggest an active diverticulitis at this time. No evidence of bowel obstruction. Vascular/Lymphatic: Atherosclerotic calcifications  within the abdominal aorta and branch vessels. No aneurysm or ectasia. No enlarged abdominopelvic lymph nodes. Reproductive: The prostate and seminal vesicles are unremarkable. Other: No abdominopelvic free fluid or free gas. No bowel containing hernias. Fat containing inguinal hernias bilaterally. Small fat containing umbilical hernia. Musculoskeletal: Mild dextrocurvature of the spine. Multilevel flowing anterior osteophytosis of the lower thoracic levels, compatible with features of diffuse idiopathic skeletal hyperostosis (DISH). Additional multilevel discogenic and facet degenerative changes in the lumbar levels as well. No mild-to-moderate degenerative changes in the hips and pelvis. No acute or suspicious osseous lesions. IMPRESSION: 1. Extensive distal colonic diverticulosis. Segment of circumferentially thickened versus decompressed colon seen in the proximal sigmoid in a region of these diverticula but without acute pericolonic inflammation. May reflect sequela of prior inflammation though should consider outpatient correlation with colonoscopy if not recently performed. 2. Cholelithiasis  without evidence of acute cholecystitis. 3. Geographic regions of hypoattenuation throughout the liver likely reflect areas of hepatic steatosis/fatty infiltration. 4. Bilateral renal atrophy/cortical thinning. Multiple simple appearing cysts bilaterally. 5. Aortic Atherosclerosis (ICD10-I70.0). Electronically Signed   By: Lovena Le M.D.   On: 10/29/2019 18:42   ECHOCARDIOGRAM COMPLETE  Result Date: 11/11/2019    ECHOCARDIOGRAM REPORT   Patient Name:   RHIAN FUNARI  Date of Exam: 11/11/2019 Medical Rec #:  [id number]  Height:       71.0 in Accession #:    [id number] Weight:       302.9 lb Date of Birth:  12/24/1945  BSA:          2.515 m Patient Age:    64 years   BP:           98/58 mmHg Patient Gender: M          HR:           56 bpm. Exam Location:  Inpatient Procedure: 2D Echo Indications:    427.31 atrial  fibrillation  History:        Patient has prior history of Echocardiogram examinations, most                 recent 10/11/2017. Risk Factors:Hypertension and Diabetes.  Sonographer:    Jannett Celestine RDCS (AE) Referring Phys: [id number] Sherryll Burger [facility]  Sonographer Comments: Technically difficult study due to poor echo windows, no parasternal window and patient is morbidly obese. Image acquisition challenging due to patient body habitus, Image acquisition challenging due to respiratory motion and restricted mobility. IMPRESSIONS  1. Left ventricular ejection fraction, by estimation, is 60 to 65%. The left ventricle has normal function. The left ventricle has no regional wall motion abnormalities. Left ventricular diastolic parameters are consistent with Grade II diastolic dysfunction (pseudonormalization).  2. Right ventricular systolic function is normal. The right ventricular size is normal.  3. The mitral valve is normal in structure. No evidence of mitral valve regurgitation. No evidence of mitral stenosis.  4. The aortic valve is normal in structure. Aortic valve regurgitation is not visualized. No aortic stenosis is present.  5. The inferior vena cava is normal in size with greater than 50% respiratory variability, suggesting right atrial pressure of 3 mmHg. FINDINGS  Left Ventricle: Left ventricular ejection fraction, by estimation, is 60 to 65%. The left ventricle has normal function. The left ventricle has no regional wall motion abnormalities. Definity contrast agent was given IV to delineate the left ventricular  endocardial borders. The left ventricular internal cavity size was normal in size. There is no left ventricular hypertrophy. Left ventricular diastolic parameters are consistent with Grade II diastolic dysfunction (pseudonormalization). Right Ventricle: The right ventricular size is normal. No increase in right ventricular wall thickness. Right ventricular systolic function is normal. Left Atrium:  Left atrial size was not well visualized. Right Atrium: Right atrial size was normal in size. Pericardium: There is no evidence of pericardial effusion. Mitral Valve: The mitral valve is normal in structure. No evidence of mitral valve regurgitation. No evidence of mitral valve stenosis. Tricuspid Valve: The tricuspid valve is not well visualized. Tricuspid valve regurgitation is trivial. No evidence of tricuspid stenosis. Aortic Valve: The aortic valve is normal in structure. Aortic valve regurgitation is not visualized. No aortic stenosis is present. Pulmonic Valve: The pulmonic valve was normal in structure. Pulmonic valve regurgitation is not visualized. No evidence of pulmonic stenosis. Aorta: The aortic root is normal in size and  structure. Venous: The inferior vena cava is normal in size with greater than 50% respiratory variability, suggesting right atrial pressure of 3 mmHg. IAS/Shunts: No atrial level shunt detected by color flow Doppler.   Diastology LV e' medial:    5.44 cm/s LV E/e' medial:  17.2 LV e' lateral:   6.53 cm/s LV E/e' lateral: 14.4  AORTIC VALVE LVOT Vmax:   58.10 cm/s LVOT Vmean:  42.500 cm/s LVOT VTI:    0.123 m MITRAL VALVE MV Area (PHT): 2.26 cm    SHUNTS MV Decel Time: 335 msec    Systemic VTI: 0.12 m MV E velocity: 93.80 cm/s MV A velocity: 38.60 cm/s MV E/A ratio:  2.43 Candee Furbish MD Electronically signed by Candee Furbish MD Signature Date/Time: 11/11/2019/3:31:15 PM    Final    VAS [place] LOWER EXTREMITY VENOUS (DVT)  Result Date: 11/12/2019  Lower Venous DVTStudy Indications: Chronic bilateral calf pain.  Limitations: Body habitus, poor ultrasound/tissue interface and patient discomfort. Comparison Study: No prior study Performing Technologist: Maudry Mayhew MHA, RDMS, RVT, RDCS  Examination Guidelines: A complete evaluation includes B-mode imaging, spectral Doppler, color Doppler, and power Doppler as needed of all accessible portions of each vessel. Bilateral testing is  considered an integral part of a complete examination. Limited examinations for reoccurring indications may be performed as noted. The reflux portion of the exam is performed with the patient in reverse Trendelenburg.  +---------+---------------+---------+-----------+----------+--------------+ RIGHT    CompressibilityPhasicitySpontaneityPropertiesThrombus Aging +---------+---------------+---------+-----------+----------+--------------+ FV Prox  Full                    Yes        Patent                   +---------+---------------+---------+-----------+----------+--------------+ FV Mid                           Yes                                 +---------+---------------+---------+-----------+----------+--------------+ FV DistalFull                                                        +---------+---------------+---------+-----------+----------+--------------+ PFV      Full                    Yes                                 +---------+---------------+---------+-----------+----------+--------------+ POP      Full           No       Yes                                 +---------+---------------+---------+-----------+----------+--------------+ PTV      Full                    Yes                                 +---------+---------------+---------+-----------+----------+--------------+  PERO     Full                    Yes                                 +---------+---------------+---------+-----------+----------+--------------+   Right Technical Findings: Not visualized segments include SFJ, CFV.  +---------+---------------+---------+-----------+----------+--------------+ LEFT     CompressibilityPhasicitySpontaneityPropertiesThrombus Aging +---------+---------------+---------+-----------+----------+--------------+ CFV      Full           No       Yes                                  +---------+---------------+---------+-----------+----------+--------------+ FV Prox  Full                                                        +---------+---------------+---------+-----------+----------+--------------+ FV Mid   Full                                                        +---------+---------------+---------+-----------+----------+--------------+ FV Distal                        Yes                                 +---------+---------------+---------+-----------+----------+--------------+ POP      Full           No       Yes                                 +---------+---------------+---------+-----------+----------+--------------+ PTV      Full                                                        +---------+---------------+---------+-----------+----------+--------------+ PERO     Full                                                        +---------+---------------+---------+-----------+----------+--------------+   Left Technical Findings: Not visualized segments include SFJ, PFV, limited evaluation of PTV and peroneal veins.   Summary: RIGHT: - There is no evidence of deep vein thrombosis in the lower extremity. However, portions of this examination were limited- see technologist comments above.  - No cystic structure found in the popliteal fossa.  LEFT: - There is no evidence of deep vein thrombosis in the lower extremity. However, portions of this examination were limited- see technologist comments above.  - No cystic structure found in the popliteal fossa.  Bilateral lower extremity venous flow  is pulsatile, suggestive of possibly elevated right heart pressure.  *See table(s) above for measurements and observations. Electronically signed by Monica Martinez MD on 11/12/2019 at 5:07:25 PM.    Final     Microbiology: Recent Results (from the past 240 hour(s))  Respiratory Panel by RT PCR (Flu A&B, Covid) - Nasopharyngeal Swab     Status: None    Collection Time: 11/09/19  7:37 PM   Specimen: Nasopharyngeal Swab  Result Value Ref Range Status   SARS Coronavirus 2 by RT PCR NEGATIVE NEGATIVE Final    Comment: (NOTE) SARS-CoV-2 target nucleic acids are NOT DETECTED.  The SARS-CoV-2 RNA is generally detectable in upper respiratoy specimens during the acute phase of infection. The lowest concentration of SARS-CoV-2 viral copies this assay can detect is 131 copies/mL. A negative result does not preclude SARS-Cov-2 infection and should not be used as the sole basis for treatment or other patient management decisions. A negative result may occur with  improper specimen collection/handling, submission of specimen other than nasopharyngeal swab, presence of viral mutation(s) within the areas targeted by this assay, and inadequate number of viral copies (<131 copies/mL). A negative result must be combined with clinical observations, patient history, and epidemiological information. The expected result is Negative.  Fact Sheet for Patients:  PinkCheek.be  Fact Sheet for Healthcare Providers:  GravelBags.it  This test is no t yet approved or cleared by the [place] FDA and  has been authorized for detection and/or diagnosis of SARS-CoV-2 by FDA under an Emergency Use Authorization (EUA). This EUA will remain  in effect (meaning this test can be used) for the duration of the COVID-19 declaration under Section 564(b)(1) of the Act, 21 U.S.C. section 360bbb-3(b)(1), unless the authorization is terminated or revoked sooner.     Influenza A by PCR NEGATIVE NEGATIVE Final   Influenza B by PCR NEGATIVE NEGATIVE Final    Comment: (NOTE) The Xpert Xpress SARS-CoV-2/FLU/RSV assay is intended as an aid in  the diagnosis of influenza from Nasopharyngeal swab specimens and  should not be used as a sole basis for treatment. Nasal washings and  aspirates are unacceptable for Xpert  Xpress SARS-CoV-2/FLU/RSV  testing.  Fact Sheet for Patients: PinkCheek.be  Fact Sheet for Healthcare Providers: GravelBags.it  This test is not yet approved or cleared by the [place] FDA and  has been authorized for detection and/or diagnosis of SARS-CoV-2 by  FDA under an Emergency Use Authorization (EUA). This EUA will remain  in effect (meaning this test can be used) for the duration of the  Covid-19 declaration under Section 564(b)(1) of the Act, 21  U.S.C. section 360bbb-3(b)(1), unless the authorization is  terminated or revoked. Performed at [facility], [address] [place], [place] [address]   Urine culture     Status: Abnormal   Collection Time: 11/09/19  7:37 PM   Specimen: Urine, Random  Result Value Ref Range Status   Specimen Description   Final    URINE, RANDOM Performed at [facility], [address] [place], [place] [address]    Special Requests   Final    NONE Performed at [facility], [address] [place], [place] [address]    Culture >=100,000 COLONIES/mL ESCHERICHIA COLI (A)  Final   Report Status 11/11/2019 FINAL  Final   Organism ID, Bacteria ESCHERICHIA COLI (A)  Final      Susceptibility   Escherichia coli - MIC*    AMPICILLIN >=  32 RESISTANT Resistant     CEFAZOLIN <=4 SENSITIVE Sensitive     CEFTRIAXONE <=0.25 SENSITIVE Sensitive     CIPROFLOXACIN >=4 RESISTANT Resistant     GENTAMICIN <=1 SENSITIVE Sensitive     IMIPENEM <=0.25 SENSITIVE Sensitive     NITROFURANTOIN <=16 SENSITIVE Sensitive     TRIMETH/SULFA >=320 RESISTANT Resistant     AMPICILLIN/SULBACTAM 16 INTERMEDIATE Intermediate     PIP/TAZO <=4 SENSITIVE Sensitive     * >=100,000 COLONIES/mL ESCHERICHIA COLI  Culture, blood (routine x 2)     Status: None   Collection Time: 11/09/19  8:01 PM   Specimen: BLOOD RIGHT HAND  Result Value Ref Range Status   Specimen Description    Final    BLOOD RIGHT HAND Performed at [facility], [address] [place], [place] [address]    Special Requests   Final    BOTTLES DRAWN AEROBIC AND ANAEROBIC Blood Culture results may not be optimal due to an inadequate volume of blood received in culture bottles Performed at [facility], [address] [place], [place] [address]    Culture   Final    NO GROWTH 5 DAYS Performed at [facility], [place] [address], [place], [place] [address]    Report Status 11/14/2019 FINAL  Final  Culture, blood (routine x 2)     Status: None   Collection Time: 11/09/19  8:10 PM   Specimen: Right Antecubital; Blood  Result Value Ref Range Status   Specimen Description   Final    RIGHT ANTECUBITAL Performed at [facility], [address] [place], [place] [address]    Special Requests   Final    BOTTLES DRAWN AEROBIC AND ANAEROBIC Blood Culture adequate volume Performed at [facility], [address], [place], [place] [address]    Culture   Final    NO GROWTH 5 DAYS Performed at [facility], [place] [address], [place], [place] [address]    Report Status 11/14/2019 FINAL  Final  Aerobic/Anaerobic Culture (surgical/deep wound)     Status: None   Collection Time: 11/10/19  3:30 AM   Specimen: Abscess  Result Value Ref Range Status   Specimen Description   Final    ABSCESS Performed at [place] [address] [place], [place] [address]    Special Requests   Final    NONE Performed at [facility], [place] [address], [place], [place] [address]    Gram Stain   Final    FEW WBC SEEN FEW GRAM POSITIVE COCCI Performed at [facility], [place] [address] [place], [place] [address]    Culture   Final    RARE ESCHERICHIA COLI RARE VIRIDANS STREPTOCOCCUS RARE BACTEROIDES FRAGILIS RARE BACTEROIDES OVATUS BETA LACTAMASE POSITIVE Performed at [facility], [place] [address] [place], [place] [address]    Report Status 11/13/2019 FINAL  Final   Organism ID, Bacteria ESCHERICHIA COLI  Final   Organism ID, Bacteria VIRIDANS STREPTOCOCCUS  Final      Susceptibility   Escherichia coli - MIC*    AMPICILLIN >=32 RESISTANT Resistant     CEFAZOLIN <=4 SENSITIVE Sensitive     CEFEPIME <=0.12 SENSITIVE Sensitive     CEFTAZIDIME <=1 SENSITIVE Sensitive     CEFTRIAXONE <=0.25 SENSITIVE Sensitive     CIPROFLOXACIN >=4 RESISTANT Resistant     GENTAMICIN <=1 SENSITIVE Sensitive  IMIPENEM <=0.25 SENSITIVE Sensitive     TRIMETH/SULFA >=320 RESISTANT Resistant     AMPICILLIN/SULBACTAM 16 INTERMEDIATE Intermediate     PIP/TAZO <=4 SENSITIVE Sensitive     * RARE ESCHERICHIA COLI   Viridans streptococcus - MIC*    PENICILLIN <=0.06 SENSITIVE Sensitive     CEFTRIAXONE 0.25 SENSITIVE Sensitive     ERYTHROMYCIN <=0.12 SENSITIVE Sensitive     LEVOFLOXACIN 0.5 SENSITIVE Sensitive     VANCOMYCIN 0.5 SENSITIVE Sensitive     * RARE VIRIDANS STREPTOCOCCUS  MRSA PCR Screening     Status: None   Collection Time: 11/10/19  4:00 AM   Specimen: Nasopharyngeal  Result Value Ref Range Status   MRSA by PCR NEGATIVE NEGATIVE Final    Comment:        The GeneXpert MRSA Assay (FDA approved for NASAL specimens only), is one component of a comprehensive MRSA colonization surveillance program. It is not intended to diagnose MRSA infection nor to guide or monitor treatment for MRSA infections. Performed at [facility], [place] [address], [place], [place] [address]      Labs: Basic Metabolic Panel: Recent Labs  Lab 11/09/19 1833 11/10/19 0958 11/11/19 0647 11/12/19 0605 11/13/19 0549  NA 132* 130* 133* 132* 132*  K 3.3* 3.3* 3.4* 3.8 4.0  CL 94* 96* 101 101 103  CO2 21* 23 21* 21* 16*  GLUCOSE 259* 231* 165* 133* 127*  BUN 19 18 19 18 15   CREATININE 1.50* 1.22 1.14 1.09 0.98  CALCIUM 9.3 8.7* 8.4* 8.8* 8.3*  MG  --  1.8  --  1.9 1.8   Liver Function  Tests: Recent Labs  Lab 11/09/19 1833 11/10/19 0958 11/11/19 0647  AST 25 16 16   ALT 15 14 12   ALKPHOS 92 74 70  BILITOT 1.4* 1.4* 1.2  PROT 6.8 5.8* 5.4*  ALBUMIN 3.3* 2.7* 2.6*   Recent Labs  Lab 11/09/19 1833  LIPASE 22   No results for input(s): AMMONIA in the last 168 hours. CBC: Recent Labs  Lab 11/09/19 1833 11/10/19 0958 11/11/19 0647 11/12/19 0605 11/13/19 0549  WBC 16.7* 16.3* 15.1* 10.3 7.9  NEUTROABS 15.3* 15.3* 13.5* 8.8* 6.2  HGB 16.3 14.3 13.7 14.4 14.3  HCT 46.7 42.3 40.9 43.7 41.6  MCV 90.9 93.8 94.7 95.0 92.0  PLT 277 223 211 235 162   Cardiac Enzymes: No results for input(s): CKTOTAL, CKMB, CKMBINDEX, TROPONINI in the last 168 hours. BNP: BNP (last 3 results) No results for input(s): BNP in the last 8760 hours.  ProBNP (last 3 results) No results for input(s): PROBNP in the last 8760 hours.  CBG: Recent Labs  Lab 11/15/19 1137 11/15/19 1739 11/15/19 2119 11/16/19 0839 11/16/19 1156  GLUCAP 139* 160* 136* 114* 164*       Signed:  Kayleen Memos, MD [facility] 11/16/2019, 12:34 PM

## 2019-11-16 NOTE — NC FL2 (Signed)
[place] LEVEL OF CARE SCREENING TOOL     IDENTIFICATION  Patient Name: Gary Butler Birthdate: [birth year] Sex: male Admission Date (Current Location): 11/09/2019  [facility] and [place] Number:  Herbalist and Address:  [facility],  [place], [place]      Provider Number: [id number]  Attending Physician Name and Address:  Kayleen Memos, DO  Relative Name and Phone Number:       Current Level of Care: Hospital Recommended Level of Care: [place] Prior Approval Number:    Date Approved/Denied:   PASRR Number:    Discharge Plan:      Current Diagnoses: Patient Active Problem List   Diagnosis Date Noted  . Acute urinary retention s/p Foley 11/10/2019 11/10/2019  . Suprasphincteric perirectal abscess s/p I&D 11/10/2019 11/10/2019  . Diarrhea 11/10/2019  . Perineal abscess 11/10/2019  . GERD without esophagitis 11/10/2019  . Atrial fibrillation ([place]) 11/10/2019  . Poorly controlled diabetes mellitus ([place]) 11/09/2019  . Dyspnea on exertion 02/19/2017  . Bilateral lower extremity edema 07/06/2015  . CHF (congestive heart failure) ([place]) 06/27/2015  . Fungal dermatitis 06/27/2015  . Edema 06/20/2015  . Increasing shortness of breath 06/20/2015  . Lumbar radiculopathy, acute 03/09/2015  . Essential hypertension 12/27/2014  . Uncontrolled type 2 diabetes mellitus with hyperglycemia, with long-term current use of insulin ([place]) 12/27/2014  . Mild aortic stenosis 06/28/2014  . Memory loss 04/29/2014  . Tinnitus of both ears 02/17/2014  . Wellness examination 12/04/2013  . Constipation due to slow transit 08/18/2013  . S/P left TKA 06/22/2013  . Meniere disease 04/21/2013  . Benign paroxysmal positional vertigo 04/21/2013  . History of methicillin resistant staphylococcus aureus (MRSA) 2015  . Psoriatic arthritis ([place]) 05/14/2012  . General medical exam 01/28/2012  . Cellulitis of eyelid 09/25/2011  .  Diverticulitis 06/20/2011  . Erythrocytosis 05/18/2011  . S/P TKR (total knee replacement) 12/31/2010  . Obesity, Class III, BMI 40-49.9 (morbid obesity) ([place]) 12/31/2010  . Hyperlipidemia 12/22/2010  . BPH (benign prostatic hyperplasia) 12/22/2010  . Gout 12/22/2010  . Obstructive sleep apnea 04/04/2009    Orientation RESPIRATION BLADDER Height & Weight     Self, Time, Situation, Place  Normal Continent Weight: (!) 137.4 kg Height:  5' 10.98" (180.3 cm)  BEHAVIORAL SYMPTOMS/MOOD NEUROLOGICAL BOWEL NUTRITION STATUS      Incontinent Diet  AMBULATORY STATUS COMMUNICATION OF NEEDS Skin   Limited Assist Verbally Surgical wounds (peri rectal abcess)                       Personal Care Assistance Level of Assistance  Bathing, Dressing Bathing Assistance: Limited assistance   Dressing Assistance: Limited assistance     Functional Limitations Info  Sight, Hearing Sight Info: Impaired Hearing Info: Impaired      SPECIAL CARE FACTORS FREQUENCY  PT (By licensed PT), OT (By licensed OT)     PT Frequency: 5 x weekly OT Frequency: 5 x weekly            Contractures Contractures Info: Not present    Additional Factors Info  Code Status, Allergies Code Status Info: full Allergies Info: Levofloxacin, Penicillins, Lyrica           Current Medications (11/16/2019):  This is the current hospital active medication list Current Facility-Administered Medications  Medication Dose Route Frequency Provider Last Rate Last Admin  . 0.9 %  sodium chloride infusion  250 mL Intravenous PRN Inda Merlin  J, MD 10 mL/hr at 11/11/19 1814 250 mL at 11/11/19 1814  . acetaminophen (TYLENOL) tablet 1,000 mg  1,000 mg Oral TID Vernelle Emerald, MD   1,000 mg at 11/16/19 1022  . acetaminophen (TYLENOL) tablet 325-650 mg  325-650 mg Oral Q6H PRN Shalhoub, Sherryll Burger, MD      . albuterol (VENTOLIN HFA) 108 (90 Base) MCG/ACT inhaler 2 puff  2 puff Inhalation Q6H PRN Shalhoub, Sherryll Burger, MD       . allopurinol (ZYLOPRIM) tablet 300 mg  300 mg Oral Daily Shalhoub, Sherryll Burger, MD   300 mg at 11/16/19 1022  . apixaban (ELIQUIS) tablet 5 mg  5 mg Oral BID Minda Ditto, RPH   5 mg at 11/16/19 1022  . aspirin EC tablet 81 mg  81 mg Oral Daily Vernelle Emerald, MD   81 mg at 11/16/19 1022  . bisoprolol (ZEBETA) tablet 2.5 mg  2.5 mg Oral Daily Irene Pap N, DO   2.5 mg at 11/16/19 1022  . cephALEXin (KEFLEX) capsule 1,000 mg  1,000 mg Oral Tor Netters, MD   1,000 mg at 11/16/19 0527  . Chlorhexidine Gluconate Cloth 2 % PADS 6 each  6 each Topical Daily Kayleen Memos, DO   6 each at 11/14/19 2027  . cholecalciferol (VITAMIN D) tablet 2,000 Units  2,000 Units Oral Daily Shalhoub, Sherryll Burger, MD   2,000 Units at 11/16/19 1021  . diphenhydrAMINE (BENADRYL) injection 12.5-25 mg  12.5-25 mg Intravenous Q6H PRN Shalhoub, Sherryll Burger, MD      . donepezil (ARICEPT) tablet 10 mg  10 mg Oral QHS Shalhoub, Sherryll Burger, MD   10 mg at 11/15/19 2148  . enalaprilat (VASOTEC) injection 0.625-1.25 mg  0.625-1.25 mg Intravenous Q6H PRN Shalhoub, Sherryll Burger, MD      . feeding supplement (KATE FARMS STANDARD 1.4) liquid 325 mL  325 mL Oral BID BM Hall, Carole N, DO   325 mL at 11/13/19 1437  . fentaNYL (SUBLIMAZE) injection 25-50 mcg  25-50 mcg Intravenous Q1H PRN Shalhoub, Sherryll Burger, MD      . fluticasone (FLONASE) 50 MCG/ACT nasal spray 2 spray  2 spray Each Nare Daily Shalhoub, Sherryll Burger, MD   2 spray at 84/13/24 4010  . folic acid (FOLVITE) tablet 1 mg  1 mg Oral Daily Shalhoub, Sherryll Burger, MD   1 mg at 11/16/19 1022  . insulin aspart (novoLOG) injection 0-20 Units  0-20 Units Subcutaneous TID AC & HS Shalhoub, Sherryll Burger, MD   4 Units at 11/15/19 1845  . lip balm (CARMEX) ointment 1 application  1 application Topical BID Shalhoub, Sherryll Burger, MD   1 application at 27/25/36 1024  . loperamide (IMODIUM) capsule 2-4 mg  2-4 mg Oral Q8H PRN Shalhoub, Sherryll Burger, MD      . loratadine (CLARITIN) tablet 10 mg  10 mg Oral  Daily Shalhoub, Sherryll Burger, MD   10 mg at 11/16/19 1021  . magic mouthwash  15 mL Oral QID PRN Shalhoub, Sherryll Burger, MD      . methocarbamol (ROBAXIN) 1,000 mg in dextrose 5 % 100 mL IVPB  1,000 mg Intravenous Q6H PRN Shalhoub, Sherryll Burger, MD      . metoprolol tartrate (LOPRESSOR) injection 5 mg  5 mg Intravenous Q6H PRN Shalhoub, Sherryll Burger, MD      . metroNIDAZOLE (FLAGYL) tablet 500 mg  500 mg Oral [address], Carole N, DO   500 mg at 11/16/19 6440  .  nystatin (MYCOSTATIN/NYSTOP) topical powder   Topical BID Michael Boston, MD   Given at 11/16/19 1025  . ondansetron (ZOFRAN) tablet 4 mg  4 mg Oral Q6H PRN Shalhoub, Sherryll Burger, MD       Or  . ondansetron [facility]) injection 4 mg  4 mg Intravenous Q6H PRN Shalhoub, Sherryll Burger, MD      . oxyCODONE (Oxy IR/ROXICODONE) immediate release tablet 5-10 mg  5-10 mg Oral Q4H PRN Shalhoub, Sherryll Burger, MD   10 mg at 11/14/19 0813  . pantoprazole (PROTONIX) EC tablet 40 mg  40 mg Oral Daily Shalhoub, Sherryll Burger, MD   40 mg at 11/16/19 1022  . polyethylene glycol (MIRALAX / GLYCOLAX) packet 17 g  17 g Oral Daily PRN Shalhoub, Sherryll Burger, MD      . psyllium (HYDROCIL/METAMUCIL) 1 packet  1 packet Oral BID Shalhoub, Sherryll Burger, MD   1 packet at 11/16/19 1021  . sertraline (ZOLOFT) tablet 50 mg  50 mg Oral Daily Shalhoub, Sherryll Burger, MD   50 mg at 11/16/19 1022  . sodium chloride flush (NS) 0.9 % injection 3 mL  3 mL Intravenous Q12H Shalhoub, Sherryll Burger, MD   3 mL at 11/16/19 1025  . sodium chloride flush (NS) 0.9 % injection 3 mL  3 mL Intravenous PRN Shalhoub, Sherryll Burger, MD      . tamsulosin (FLOMAX) capsule 0.4 mg  0.4 mg Oral QPC supper Irene Pap N, DO   0.4 mg at 11/15/19 1816  . tiZANidine (ZANAFLEX) tablet 4 mg  4 mg Oral Q6H PRN Shalhoub, Sherryll Burger, MD      . vitamin B-12 (CYANOCOBALAMIN) tablet 2,000 mcg  2,000 mcg Oral Daily Shalhoub, Sherryll Burger, MD   2,000 mcg at 11/16/19 1022     Discharge Medications: Please see discharge summary for a list of discharge  medications.  Relevant Imaging Results:  Relevant Lab Results:   Additional Information [id number]  Justise Ehmann, Marjie Skiff, RN

## 2019-11-16 NOTE — TOC Progression Note (Addendum)
Transition of Care [facility]) - Progression Note    Patient Details  Name: Gary Butler MRN: [id number] Date of Birth: 20-Dec-1945  Transition of Care [facility] CM/SW Contact  Yardley Lekas, Marjie Skiff, RN Phone Number: 11/16/2019, 1:51 PM  Clinical Narrative:    This CM faxed out FL2 due to family stating that they can't care for him at home. Last 2 PT notes sent to [facility] along with other clinicals. Will await [place] response to see if they will approve auth. PASSR: [id number]  Expected Discharge Plan: [place] Barriers to Discharge: Continued Medical Work up  Expected Discharge Plan and Services Expected Discharge Plan: [place]   Discharge Planning Services: CM Consult Post Acute Care Choice: [place] arrangements for the past 2 months: Single Family Home Expected Discharge Date: 11/16/19                         HH Arranged: PT, OT, RN [place] Agency: [place] Date [facility] Agency Contacted: 11/13/19 Time [place]: [id number] Representative spoke with at [place]: [place] ([place]) Interventions    Readmission Risk Interventions No flowsheet data found.

## 2019-11-17 DIAGNOSIS — Z825 Family history of asthma and other chronic lower respiratory diseases: Secondary | ICD-10-CM | POA: Diagnosis not present

## 2019-11-17 DIAGNOSIS — L4050 Arthropathic psoriasis, unspecified: Secondary | ICD-10-CM | POA: Diagnosis present

## 2019-11-17 DIAGNOSIS — I480 Paroxysmal atrial fibrillation: Secondary | ICD-10-CM | POA: Diagnosis present

## 2019-11-17 DIAGNOSIS — I10 Essential (primary) hypertension: Secondary | ICD-10-CM | POA: Diagnosis not present

## 2019-11-17 DIAGNOSIS — F0390 Unspecified dementia without behavioral disturbance: Secondary | ICD-10-CM | POA: Diagnosis present

## 2019-11-17 DIAGNOSIS — Z7984 Long term (current) use of oral hypoglycemic drugs: Secondary | ICD-10-CM | POA: Diagnosis not present

## 2019-11-17 DIAGNOSIS — L7622 Postprocedural hemorrhage and hematoma of skin and subcutaneous tissue following other procedure: Secondary | ICD-10-CM | POA: Diagnosis present

## 2019-11-17 DIAGNOSIS — G4733 Obstructive sleep apnea (adult) (pediatric): Secondary | ICD-10-CM | POA: Diagnosis present

## 2019-11-17 DIAGNOSIS — I5032 Chronic diastolic (congestive) heart failure: Secondary | ICD-10-CM | POA: Diagnosis present

## 2019-11-17 DIAGNOSIS — Z20822 Contact with and (suspected) exposure to covid-19: Secondary | ICD-10-CM | POA: Diagnosis present

## 2019-11-17 DIAGNOSIS — Z23 Encounter for immunization: Secondary | ICD-10-CM | POA: Diagnosis not present

## 2019-11-17 DIAGNOSIS — R41841 Cognitive communication deficit: Secondary | ICD-10-CM | POA: Diagnosis not present

## 2019-11-17 DIAGNOSIS — I69828 Other speech and language deficits following other cerebrovascular disease: Secondary | ICD-10-CM | POA: Diagnosis not present

## 2019-11-17 DIAGNOSIS — R197 Diarrhea, unspecified: Secondary | ICD-10-CM | POA: Diagnosis not present

## 2019-11-17 DIAGNOSIS — Z48817 Encounter for surgical aftercare following surgery on the skin and subcutaneous tissue: Secondary | ICD-10-CM | POA: Diagnosis not present

## 2019-11-17 DIAGNOSIS — L02215 Cutaneous abscess of perineum: Secondary | ICD-10-CM | POA: Diagnosis not present

## 2019-11-17 DIAGNOSIS — K612 Anorectal abscess: Secondary | ICD-10-CM | POA: Diagnosis present

## 2019-11-17 DIAGNOSIS — Z7401 Bed confinement status: Secondary | ICD-10-CM | POA: Diagnosis not present

## 2019-11-17 DIAGNOSIS — K219 Gastro-esophageal reflux disease without esophagitis: Secondary | ICD-10-CM | POA: Diagnosis present

## 2019-11-17 DIAGNOSIS — Z832 Family history of diseases of the blood and blood-forming organs and certain disorders involving the immune mechanism: Secondary | ICD-10-CM | POA: Diagnosis not present

## 2019-11-17 DIAGNOSIS — R5381 Other malaise: Secondary | ICD-10-CM | POA: Diagnosis not present

## 2019-11-17 DIAGNOSIS — N390 Urinary tract infection, site not specified: Secondary | ICD-10-CM | POA: Diagnosis not present

## 2019-11-17 DIAGNOSIS — M2550 Pain in unspecified joint: Secondary | ICD-10-CM | POA: Diagnosis not present

## 2019-11-17 DIAGNOSIS — R338 Other retention of urine: Secondary | ICD-10-CM | POA: Diagnosis present

## 2019-11-17 DIAGNOSIS — Z7901 Long term (current) use of anticoagulants: Secondary | ICD-10-CM | POA: Diagnosis not present

## 2019-11-17 DIAGNOSIS — E43 Unspecified severe protein-calorie malnutrition: Secondary | ICD-10-CM | POA: Diagnosis not present

## 2019-11-17 DIAGNOSIS — I110 Hypertensive heart disease with heart failure: Secondary | ICD-10-CM | POA: Diagnosis present

## 2019-11-17 DIAGNOSIS — R339 Retention of urine, unspecified: Secondary | ICD-10-CM | POA: Diagnosis present

## 2019-11-17 DIAGNOSIS — N401 Enlarged prostate with lower urinary tract symptoms: Secondary | ICD-10-CM | POA: Diagnosis present

## 2019-11-17 DIAGNOSIS — L409 Psoriasis, unspecified: Secondary | ICD-10-CM | POA: Diagnosis not present

## 2019-11-17 DIAGNOSIS — Z8249 Family history of ischemic heart disease and other diseases of the circulatory system: Secondary | ICD-10-CM | POA: Diagnosis not present

## 2019-11-17 DIAGNOSIS — Z8261 Family history of arthritis: Secondary | ICD-10-CM | POA: Diagnosis not present

## 2019-11-17 DIAGNOSIS — M109 Gout, unspecified: Secondary | ICD-10-CM | POA: Diagnosis present

## 2019-11-17 DIAGNOSIS — K611 Rectal abscess: Secondary | ICD-10-CM | POA: Diagnosis not present

## 2019-11-17 DIAGNOSIS — E1165 Type 2 diabetes mellitus with hyperglycemia: Secondary | ICD-10-CM | POA: Diagnosis present

## 2019-11-17 DIAGNOSIS — D6869 Other thrombophilia: Secondary | ICD-10-CM | POA: Diagnosis not present

## 2019-11-17 DIAGNOSIS — M1A09X0 Idiopathic chronic gout, multiple sites, without tophus (tophi): Secondary | ICD-10-CM | POA: Diagnosis not present

## 2019-11-17 DIAGNOSIS — K921 Melena: Secondary | ICD-10-CM | POA: Diagnosis present

## 2019-11-17 DIAGNOSIS — R634 Abnormal weight loss: Secondary | ICD-10-CM | POA: Diagnosis not present

## 2019-11-17 DIAGNOSIS — N179 Acute kidney failure, unspecified: Secondary | ICD-10-CM | POA: Diagnosis present

## 2019-11-17 DIAGNOSIS — R2681 Unsteadiness on feet: Secondary | ICD-10-CM | POA: Diagnosis not present

## 2019-11-17 DIAGNOSIS — K5790 Diverticulosis of intestine, part unspecified, without perforation or abscess without bleeding: Secondary | ICD-10-CM | POA: Diagnosis not present

## 2019-11-17 DIAGNOSIS — M6281 Muscle weakness (generalized): Secondary | ICD-10-CM | POA: Diagnosis not present

## 2019-11-17 DIAGNOSIS — K922 Gastrointestinal hemorrhage, unspecified: Secondary | ICD-10-CM | POA: Diagnosis present

## 2019-11-17 DIAGNOSIS — Z6841 Body Mass Index (BMI) 40.0 and over, adult: Secondary | ICD-10-CM | POA: Diagnosis not present

## 2019-11-17 LAB — RESPIRATORY PANEL BY RT PCR (FLU A&B, COVID)
Influenza A by PCR: NEGATIVE
Influenza B by PCR: NEGATIVE
SARS Coronavirus 2 by RT PCR: NEGATIVE

## 2019-11-17 LAB — GLUCOSE, CAPILLARY
Glucose-Capillary: 127 mg/dL — ABNORMAL HIGH (ref 70–99)
Glucose-Capillary: 149 mg/dL — ABNORMAL HIGH (ref 70–99)

## 2019-11-17 NOTE — Discharge Summary (Addendum)
Discharge Summary  Kaisei Gilbo AOZ:[id number] DOB: 03-16-1945  PCP: [name], MD  Admit date: 11/09/2019 Discharge date: 11/17/2019  Time spent: 35 minutes  Recommendations for Outpatient Follow-up:  1. Follow-up with general surgery 2. Follow-up with urology for a voiding trial. 3. Follow-up with cardiology. 4. Follow-up with your PCP 5. Take your medication as prescribed 6. Continue PT OT with assistance and fall precautions at SNF facility. 7. Continue local wound care  Discharge Diagnoses:  Active Hospital Problems   Diagnosis Date Noted  . Suprasphincteric perirectal abscess s/p I&D 11/10/2019 11/10/2019  . Acute urinary retention s/p Foley 11/10/2019 11/10/2019  . Diarrhea 11/10/2019  . Perineal abscess 11/10/2019  . GERD without esophagitis 11/10/2019  . Atrial fibrillation ([place]) 11/10/2019  . Uncontrolled type 2 diabetes mellitus with hyperglycemia, with long-term current use of insulin ([place]) 12/27/2014  . Essential hypertension 12/27/2014  . Obstructive sleep apnea 04/04/2009    Resolved Hospital Problems  No resolved problems to display.    Discharge Condition: Stable  Diet recommendation: Heart healthy carb modified diet.  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 Patient presented to [place] emergency department with complaints of frequent bouts of watery diarrhea and rectal pain.  Patient explains that for at least the past 6 weeks he has been experiencing what he describes as explosive bouts of watery diarrhea.   Associated with progressively worsening rectal discomfort and frequent bouts of vomiting and inability to tolerate oral intake for the past several days.  Underwent recent EGD approximately 1 week prior to presentation with esophageal dilation.  Presented to [place] emergency department at the direction of his primary care provider.  At [facility],  CT abdomen and pelvis revealed evidence of abscess formation of 3.4 x 2.5 cm just below the base of the penis. There was initially concern for Fournier's gangrene and therefore after discussion with urology and general surgery patient was transferred to the [facility] long ED for further evaluation and management.   Work up revealed perineal abscess without Fournier's. Patient was taken to the operating room at [facility] long by Dr. Johney Maine for an incision and drainage. TRH was asked to admit.  Hospital course complicated by acute urinary retention.  His bladder was extremely distended on CT scan.  Foley catheter was placed on 11/10/2019 and removed on 11/13/2019.  He failed a voiding trial and a Foley catheter was replaced on 11/14/19.  Discussed with Dr. Jacalyn Lefevre of urology, will follow up outpatient for a voiding trial in the office.  Recommended to continue Flomax.  11/17/19:  No acute events overnight.  No new complaints.  Would like to go to rehab.    Hospital Course:  Principal Problem:   Suprasphincteric perirectal abscess s/p I&D 11/10/2019 Active Problems:   Obstructive sleep apnea   Essential hypertension   Uncontrolled type 2 diabetes mellitus with hyperglycemia, with long-term current use of insulin (HCC)   Acute urinary retention s/p Foley 11/10/2019   Diarrhea   Perineal abscess   GERD without esophagitis  Atrial fibrillation (HCC)  Sepsis, resolved, 2/2 Suprasphincteric perirectal abscess s/p I&D 11/10/2019 by Dr. Johney Maine, E-coli UTI  Patient is status post successful incision and drainage by Dr. Johney Maine  Surgery  assisted with education on dressing changes  Wound culture grew rare e-coli and rare viridans streptococcus, also rare BACTEROIDES FRAGILIS, RARE BACTEROIDES OVATUS, BETA LACTAMASE POSITIVE, FINAL.  Switched Rocephin and IV flagyl to keflex and po flagyl on 11/13/2019.  Patient has a history of a perianal fistula requiring fistulotomy in 2019 at [facility]. The appearance of the abscess is concerning for the development of another fistula.  Blood cultures no growth to date  Pain control.  Follow-up with surgery outpatient.  Continue local wound care  Acute urinary retention Urinary retention post op, Foley catheter placed on 11/10/2019 Foley catheter was removed on 11/13/2019, unable to void, in and out cath revealed 900 cc urine output. Foley catheter re-inserted on 11/14/19 after failing voiding trials, patient will need to follow-up with urology outpatient for voiding trial in the office, discussed with Dr. Claudia Desanctis, urology. Continue Flomax Follow-up with urology outpatient.  New onset atrial fibrillation  Patient noted to be in atrial fibrillation on 11/09/19, in the setting of sepsis.  TSH normal 1.1, troponin within normal limit 15 on 11/10/19  2D echo done on 11/11/2019 showed LVEF 60 to 65% with grade 2 diastolic dysfunction  Chadsvasc score of 3  Cardiology consulted to advise on [facility]  He is on home Bisoprolol, this has controlled his rate  Added telemetry monitoring  Started DOAC Eliquis on 11/12/19  Follow up with Afib clinic/cardiology outpatient  Resolving E. coli UTI Urine culture growing greater than 100,000 colonies of E. coli with resistance to ampicillin, intermediate resistance to ampicillin/sulbactam, ciprofloxacin, and Bactrim. Currently on Keflex  Stable, Osteoarthritis/severe lower extremity tenderness with palpation.  Ruled out DVT with B/L doppler US Continue home regimen Follow-up with your PCP  History of gout, stable On  allopurinol.  Improved Diarrhea with less frequency  Patient reports 6 to 8-week history of diarrhea, etiology unclear  Had stool studies performed by his primary care provider a week or so ago that were negative for C. Difficile  No enteric precautions required  Follow-up with your PCP  Essential hypertension BPs are soft Currently on bisoprolol 2.5 mg daily  Maxzide and Telmesartan help to avoid hypotension Resume home Lasix and potassium replacement. Follow-up with your PCP  Uncontrolled type 2 diabetes mellitus with hyperglycemia,  Recent hemoglobin A1c was found to be elevated at 9.1 on 10/26/19  Resume home regimen.  Follow-up with your PCP  Severe morbid obesity BMI 42 Recommend weight loss outpatient with regular physical activity and healthy dieting Would benefit from bariatric surgery for weight loss evaluation  OSA on BIPAP Continue BIPAP  Physical debility PT recommended SNF Continue PT OT with assistance and fall precaution at SNF.     Code Status:Full code    Consultants:  General surgery  Cardiology  TOC team  Procedures:  Post I&D  Antimicrobials:  IV vancomycin, completed  Cefepime, completed  IV Flagyl, completed  Keflex, po flagyl 11/13/19>>11/24/19.   Discharge Exam: BP 122/66 (BP Location: Right Arm)   Pulse 60   Temp 98.2 F (36.8 C) (Oral)   Resp 15   Ht 5' 10.98" (1.803 m)   Wt (!) 137.4 kg   SpO2 93%   BMI 42.27 kg/m  . General: 74 y.o. year-old male well developed well nourished in no acute distress.  Alert and oriented x3. Marland Kitchen  Cardiovascular: Regular rate and rhythm with no rubs or gallops.  No thyromegaly or JVD noted.   Marland Kitchen Respiratory: Clear to auscultation with no wheezes or rales. Good inspiratory effort. . Abdomen: Soft nontender nondistended with normal bowel sounds x4 quadrants. . Musculoskeletal: No lower extremity edema bilaterally. Marland Kitchen Psychiatry: Mood is appropriate for condition  and setting  Discharge Instructions You were cared for by a hospitalist during your hospital stay. If you have any questions about your discharge medications or the care you received while you were in the hospital after you are discharged, you can call the unit and asked to speak with the hospitalist on call if the hospitalist that took care of you is not available. Once you are discharged, your primary care physician will handle any further medical issues. Please note that NO REFILLS for any discharge medications will be authorized once you are discharged, as it is imperative that you return to your primary care physician (or establish a relationship with a primary care physician if you do not have one) for your aftercare needs so that they can reassess your need for medications and monitor your lab values.   Allergies as of 11/17/2019      Reactions   Penicillins Hives   Patient reports full body hives that required medical treatment when he was in his 46s or 71s. He tolerated cephalosporins.    Lyrica [pregabalin] Swelling   Levofloxacin Swelling      Medication List    STOP taking these medications   etodolac 400 MG tablet Commonly known as: LODINE   Myrbetriq 25 MG Tb24 tablet Generic drug: mirabegron ER   ondansetron 8 MG tablet Commonly known as: ZOFRAN   telmisartan 40 MG tablet Commonly known as: MICARDIS   triamterene-hydrochlorothiazide 75-50 MG tablet Commonly known as: MAXZIDE     TAKE these medications   Albuterol Sulfate 108 (90 Base) MCG/ACT Aepb Commonly known as: ProAir RespiClick Inhale 2 puffs into the lungs every 6 (six) hours as needed (for wheezing or shortness of breath).   allopurinol 300 MG tablet Commonly known as: ZYLOPRIM TAKE 1 TABLET(300 MG) BY MOUTH DAILY What changed: See the new instructions.   apixaban 5 MG Tabs tablet Commonly known as: ELIQUIS Take 1 tablet (5 mg total) by mouth 2 (two) times daily.   aspirin 81 MG tablet Take 81 mg  by mouth daily.   atorvastatin 40 MG tablet Commonly known as: LIPITOR TAKE 1 TABLET(40 MG) BY MOUTH EVERY EVENING What changed:   how much to take  how to take this  when to take this  additional instructions   B-12 2000 MCG Tabs Take 1 tablet by mouth daily.   bisoprolol 5 MG tablet Commonly known as: ZEBETA Take 0.5 tablets (2.5 mg total) by mouth daily. What changed:   how much to take  how to take this  when to take this  additional instructions   cephALEXin 500 MG capsule Commonly known as: KEFLEX Take 2 capsules (1,000 mg total) by mouth 3 (three) times daily for 10 days.   cetirizine 10 MG tablet Commonly known as: ZYRTEC TAKE 1 TABLET(10 MG) BY MOUTH DAILY What changed:   how much to take  how to take this  when to take this  additional instructions   donepezil 10 MG tablet Commonly known as: Aricept Take 1 tablet (10 mg total) by mouth at bedtime.   fluocinonide cream 0.05 % Commonly known as: LIDEX   fluticasone 50 MCG/ACT nasal spray Commonly  known as: FLONASE Place 2 sprays into both nostrils daily. What changed:   when to take this  reasons to take this   folic acid 1 MG tablet Commonly known as: FOLVITE Take 1 mg by mouth daily.   furosemide 20 MG tablet Commonly known as: LASIX TAKE 1 TABLET(20 MG) BY MOUTH DAILY What changed:   how much to take  how to take this  when to take this  additional instructions   Jardiance 10 MG Tabs tablet Generic drug: empagliflozin Take 10 mg by mouth daily before breakfast.   meloxicam 15 MG tablet Commonly known as: MOBIC Take 1 tablet by mouth daily.   metroNIDAZOLE 500 MG tablet Commonly known as: FLAGYL Take 1 tablet (500 mg total) by mouth every 8 (eight) hours for 10 days.   nystatin ointment Commonly known as: MYCOSTATIN Apply 1 application topically 2 (two) times daily. What changed:   when to take this  reasons to take this   omeprazole 40 MG  capsule Commonly known as: PRILOSEC TAKE 1 CAPSULE BY MOUTH 30 TO 60 MINUTES BEFORE YOUR FIRST AND LAST MEAL OF THE DAY What changed:   how much to take  how to take this  when to take this  additional instructions   polyethylene glycol 17 g packet Commonly known as: MIRALAX / GLYCOLAX Take 17 g by mouth daily as needed for mild constipation.   potassium chloride 10 MEQ CR capsule Commonly known as: MICRO-K TAKE 1 CAPSULE BY MOUTH EVERY DAY What changed:   how much to take  how to take this  when to take this  additional instructions   psyllium 95 % Pack Commonly known as: HYDROCIL/METAMUCIL Take 1 packet by mouth 2 (two) times daily.   sertraline 50 MG tablet Commonly known as: ZOLOFT TAKE 1 TABLET(50 MG) BY MOUTH DAILY. What changed:   how much to take  how to take this  when to take this  additional instructions   tamsulosin 0.4 MG Caps capsule Commonly known as: FLOMAX Take 1 capsule (0.4 mg total) by mouth daily after supper.   tiZANidine 4 MG tablet Commonly known as: ZANAFLEX Take 1 tablet (4 mg total) by mouth every 6 (six) hours as needed for muscle spasms.   UNABLE TO FIND Med Name: BiPap with [employer]   Vitamin D 50 MCG (2000 UT) tablet Take 2,000 Units by mouth daily.      Allergies  Allergen Reactions  . Penicillins Hives    Patient reports full body hives that required medical treatment when he was in his 20s or 71s. He tolerated cephalosporins.   [name] [Pregabalin] Swelling  . Levofloxacin Swelling    Follow-up Information    Michael Boston, MD Follow up on 11/26/2019.   Specialty: General Surgery Why: 11:00am, arrive by 10:30am for paperwork and check in process Contact information: [place] [address] [phone]        Malka So R, [place] Follow up.   Specialty: Cardiology Why: Follow-up visit in [facility] scheduled for 11/23/2019 at 2pm. Parking code for October is  3009. You can enter through entrance C. Contact information: [place] [address] [phone]        [name], MD. Call in 1 day(s).   Specialty: Family Medicine Why: Please call for a post hospital follow up appointment Contact information: [address] [place] [address] [phone]  Troy Sine, MD .   Specialty: Cardiology Contact information: [address] [place] [address] [phone]        Robley Fries, MD. Call in 1 day(s).   Specialty: Urology Why: Please call for a post hospital follow up appointment Contact information: [address] [place] [address] [phone]        [facility], [facility] Follow up.   Specialty: Home Health Services Contact information: [place] [address] [phone]                The results of significant diagnostics from this hospitalization (including imaging, microbiology, ancillary and laboratory) are listed below for reference.    Significant Diagnostic Studies: MR Brain Wo Contrast  Result Date: 10/29/2019 CLINICAL DATA:  Memory loss.  Dementia. EXAM: MRI HEAD WITHOUT CONTRAST TECHNIQUE: Multiplanar, multiecho pulse sequences of the brain and surrounding structures were obtained without intravenous contrast. COMPARISON:  02/28/2006 FINDINGS: Brain: Generalized atrophy with relative frontal and temporal predominance. Diffusion imaging does not show any acute or subacute infarction. Mild chronic small-vessel ischemic changes affect pons. No focal cerebellar finding. Cerebral hemispheres show moderate chronic small-vessel ischemic change of the white matter. No cortical or large vessel territory infarction. No mass lesion, hemorrhage, hydrocephalus or extra-axial collection. Vascular: Major vessels at the base of the brain show flow. Skull and upper cervical spine: Negative Sinuses/Orbits: Clear/normal  Other: None IMPRESSION: No acute or reversible finding. Generalized atrophy with relative frontal and temporal predominance. Moderate chronic small-vessel ischemic changes of the cerebral hemispheric white matter. Electronically Signed   By: Nelson Chimes M.D.   On: 10/29/2019 10:53   CT Abdomen Pelvis W Contrast  Result Date: 11/09/2019 CLINICAL DATA:  Bowel obstruction suspected vomiting since esophageal dilation, unable to tolerate PO Increasing weakness. EXAM: CT ABDOMEN AND PELVIS WITH CONTRAST TECHNIQUE: Multidetector CT imaging of the abdomen and pelvis was performed using the standard protocol following bolus administration of intravenous contrast. CONTRAST:  24mL OMNIPAQUE IOHEXOL 300 MG/ML  SOLN COMPARISON:  CT 11 days ago 10/29/2019 FINDINGS: Lower chest: Again seen subpleural fat at the lung bases. No pleural fluid. No lower pneumomediastinum or esophageal wall thickening. No focal consolidation. Hepatobiliary: Decreased hepatic density consistent with steatosis. Previous geographic fatty distribution is more homogeneous on the current exam, likely related to differences in contrast timing. There is no discrete focal hepatic lesion. Layering gallstones without pericholecystic inflammation. There is no biliary dilatation. No visualized choledocholithiasis. Pancreas: Fatty atrophy.  No ductal dilatation or inflammation. Spleen: Normal in size without focal abnormality. Adrenals/Urinary Tract: Normal right adrenal gland. Punctate left adrenal calcification unchanged. No hydronephrosis or perinephric edema. Again seen thinning of bilateral renal parenchyma. There are bilateral renal cysts are unchanged since recent exam. The urinary bladder is distended. No bladder wall thickening. Bladder(volume = 1580 cm^3). Stomach/Bowel: No distal esophageal wall thickening. Stomach is decompressed. Normal positioning of the duodenum and ligament of Treitz. Decompressed small bowel without obstruction or inflammation.  Normal appendix. Small volume of stool throughout the colon. Sigmoid colon is tortuous. Distal colonic diverticulosis without focal diverticulitis. There is no colonic wall thickening or inflammation. Vascular/Lymphatic: Aorto bi-iliac atherosclerosis without aortic aneurysm. The portal vein is patent. No acute vascular finding. No abdominopelvic adenopathy. Reproductive: Normal sized prostate gland, see details below regarding adjacent soft tissue air. Other: There is edema and soft tissue gas involving the left perineum extending to the base of the penis. Small perineal fluid collection measures  3.4 x 2.5 cm, series 2, image 87. Tracking soft tissue air extends superiorly along the inferior margin of the prostate gland. No abdominopelvic ascites. Musculoskeletal: Multilevel degenerative change throughout the spine. Degenerative change of the hips. No bony destruction or acute osseous abnormality. IMPRESSION: 1. No bowel obstruction. 2. Midline perineal abscess measuring 3.4 x 2.5 cm just below the base of the penis anterior to the rectum, with patchy soft tissue air tracking in the left perineum, into the subcutaneous tissues. Findings highly suspicious for Fournier's gangrene. 3. Distended urinary bladder without bladder wall thickening. 4. Hepatic steatosis. Cholelithiasis without gallbladder inflammation. 5. Colonic diverticulosis without diverticulitis. Aortic Atherosclerosis (ICD10-I70.0). These results were called by telephone at the time of interpretation on 11/09/2019 at 9:17 pm to Dr Apolonio Schneiders LITTLE , who verbally acknowledged these results. Electronically Signed   By: Keith Rake M.D.   On: 11/09/2019 21:18   CT Abdomen Pelvis W Contrast  Result Date: 10/29/2019 CLINICAL DATA:  Diarrhea and shortness of breath for 6 weeks. EXAM: CT ABDOMEN AND PELVIS WITH CONTRAST TECHNIQUE: Multidetector CT imaging of the abdomen and pelvis was performed using the standard protocol following bolus administration  of intravenous contrast. CONTRAST:  138mL OMNIPAQUE IOHEXOL 300 MG/ML  SOLN COMPARISON:  Abdominal ultrasound 05/19/2012, 06/05/2011 FINDINGS: Lower chest: Subpleural fat noted in the lung bases. Few bandlike areas of opacity, likely scarring or atelectasis. Lung bases are otherwise clear. Abundant mediastinal and pericardial fat noted as well. Cardiac size is within normal limits. No pericardial effusion. Hepatobiliary: Geographic regions of hypoattenuation throughout the liver likely reflect areas of hepatic steatosis/fatty infiltration. No focal concerning liver lesion. Smooth liver surface contour. Gallbladder contains multiple dependently layering calcified gallstones. No gallbladder wall thickening, pericholecystic fluid or inflammation. No visible calcified intraductal gallstones or biliary dilatation. Pancreas: Partial fatty replacement of the pancreas. No pancreatic ductal dilatation or surrounding inflammatory changes. Spleen: No frank splenomegaly.  No concerning splenic lesions. Adrenals/Urinary Tract: Calcification of the left adrenal gland could reflect sequela of prior infection or hemorrhage. No concerning adrenal lesion. Bilateral renal atrophy/cortical thinning. Multiple fluid attenuation cysts are present in both kidneys, largest is seen in the right kidney measuring up to 7.1 cm in the upper pole. No concerning focal renal lesions. No urolithiasis or hydronephrosis. Moderate bladder distension without other gross bladder abnormality. Stomach/Bowel: Distal esophagus, stomach and duodenum are unremarkable. No small bowel thickening or dilatation. A normal appendix is visualized in the right lower quadrant. No proximal colonic thickening or dilatation. Numerous distal colonic diverticula and a segment of circumferentially thickened versus decompressed colon seen in the proximal sigmoid in a region of these diverticular but without acute pericolonic inflammation to suggest an active diverticulitis at  this time. No evidence of bowel obstruction. Vascular/Lymphatic: Atherosclerotic calcifications within the abdominal aorta and branch vessels. No aneurysm or ectasia. No enlarged abdominopelvic lymph nodes. Reproductive: The prostate and seminal vesicles are unremarkable. Other: No abdominopelvic free fluid or free gas. No bowel containing hernias. Fat containing inguinal hernias bilaterally. Small fat containing umbilical hernia. Musculoskeletal: Mild dextrocurvature of the spine. Multilevel flowing anterior osteophytosis of the lower thoracic levels, compatible with features of diffuse idiopathic skeletal hyperostosis (DISH). Additional multilevel discogenic and facet degenerative changes in the lumbar levels as well. No mild-to-moderate degenerative changes in the hips and pelvis. No acute or suspicious osseous lesions. IMPRESSION: 1. Extensive distal colonic diverticulosis. Segment of circumferentially thickened versus decompressed colon seen in the proximal sigmoid in a region of these diverticula but without acute pericolonic inflammation. May  reflect sequela of prior inflammation though should consider outpatient correlation with colonoscopy if not recently performed. 2. Cholelithiasis without evidence of acute cholecystitis. 3. Geographic regions of hypoattenuation throughout the liver likely reflect areas of hepatic steatosis/fatty infiltration. 4. Bilateral renal atrophy/cortical thinning. Multiple simple appearing cysts bilaterally. 5. Aortic Atherosclerosis (ICD10-I70.0). Electronically Signed   By: Lovena Le M.D.   On: 10/29/2019 18:42   ECHOCARDIOGRAM COMPLETE  Result Date: 11/11/2019    ECHOCARDIOGRAM REPORT   Patient Name:   NGHIA MCENTEE  Date of Exam: 11/11/2019 Medical Rec #:  [id number]  Height:       71.0 in Accession #:    [id number] Weight:       302.9 lb Date of Birth:  12/27/1945  BSA:          2.515 m Patient Age:    21 years   BP:           98/58 mmHg Patient Gender: M          HR:            56 bpm. Exam Location:  Inpatient Procedure: 2D Echo Indications:    427.31 atrial fibrillation  History:        Patient has prior history of Echocardiogram examinations, most                 recent 10/11/2017. Risk Factors:Hypertension and Diabetes.  Sonographer:    Jannett Celestine RDCS (AE) Referring Phys: [id number] [facility]  Sonographer Comments: Technically difficult study due to poor echo windows, no parasternal window and patient is morbidly obese. Image acquisition challenging due to patient body habitus, Image acquisition challenging due to respiratory motion and restricted mobility. IMPRESSIONS  1. Left ventricular ejection fraction, by estimation, is 60 to 65%. The left ventricle has normal function. The left ventricle has no regional wall motion abnormalities. Left ventricular diastolic parameters are consistent with Grade II diastolic dysfunction (pseudonormalization).  2. Right ventricular systolic function is normal. The right ventricular size is normal.  3. The mitral valve is normal in structure. No evidence of mitral valve regurgitation. No evidence of mitral stenosis.  4. The aortic valve is normal in structure. Aortic valve regurgitation is not visualized. No aortic stenosis is present.  5. The inferior vena cava is normal in size with greater than 50% respiratory variability, suggesting right atrial pressure of 3 mmHg. FINDINGS  Left Ventricle: Left ventricular ejection fraction, by estimation, is 60 to 65%. The left ventricle has normal function. The left ventricle has no regional wall motion abnormalities. Definity contrast agent was given IV to delineate the left ventricular  endocardial borders. The left ventricular internal cavity size was normal in size. There is no left ventricular hypertrophy. Left ventricular diastolic parameters are consistent with Grade II diastolic dysfunction (pseudonormalization). Right Ventricle: The right ventricular size is normal. No increase in right  ventricular wall thickness. Right ventricular systolic function is normal. Left Atrium: Left atrial size was not well visualized. Right Atrium: Right atrial size was normal in size. Pericardium: There is no evidence of pericardial effusion. Mitral Valve: The mitral valve is normal in structure. No evidence of mitral valve regurgitation. No evidence of mitral valve stenosis. Tricuspid Valve: The tricuspid valve is not well visualized. Tricuspid valve regurgitation is trivial. No evidence of tricuspid stenosis. Aortic Valve: The aortic valve is normal in structure. Aortic valve regurgitation is not visualized. No aortic stenosis is present. Pulmonic Valve: The pulmonic valve was normal in structure. Pulmonic valve  regurgitation is not visualized. No evidence of pulmonic stenosis. Aorta: The aortic root is normal in size and structure. Venous: The inferior vena cava is normal in size with greater than 50% respiratory variability, suggesting right atrial pressure of 3 mmHg. IAS/Shunts: No atrial level shunt detected by color flow Doppler.   Diastology LV e' medial:    5.44 cm/s LV E/e' medial:  17.2 LV e' lateral:   6.53 cm/s LV E/e' lateral: 14.4  AORTIC VALVE LVOT Vmax:   58.10 cm/s LVOT Vmean:  42.500 cm/s LVOT VTI:    0.123 m MITRAL VALVE MV Area (PHT): 2.26 cm    SHUNTS MV Decel Time: 335 msec    Systemic VTI: 0.12 m MV E velocity: 93.80 cm/s MV A velocity: 38.60 cm/s MV E/A ratio:  2.43 Candee Furbish MD Electronically signed by Candee Furbish MD Signature Date/Time: 11/11/2019/3:31:15 PM    Final    VAS [place] LOWER EXTREMITY VENOUS (DVT)  Result Date: 11/12/2019  Lower Venous DVTStudy Indications: Chronic bilateral calf pain.  Limitations: Body habitus, poor ultrasound/tissue interface and patient discomfort. Comparison Study: No prior study Performing Technologist: Maudry Mayhew MHA, RDMS, RVT, RDCS  Examination Guidelines: A complete evaluation includes B-mode imaging, spectral Doppler, color Doppler, and power  Doppler as needed of all accessible portions of each vessel. Bilateral testing is considered an integral part of a complete examination. Limited examinations for reoccurring indications may be performed as noted. The reflux portion of the exam is performed with the patient in reverse Trendelenburg.  +---------+---------------+---------+-----------+----------+--------------+ RIGHT    CompressibilityPhasicitySpontaneityPropertiesThrombus Aging +---------+---------------+---------+-----------+----------+--------------+ FV Prox  Full                    Yes        Patent                   +---------+---------------+---------+-----------+----------+--------------+ FV Mid                           Yes                                 +---------+---------------+---------+-----------+----------+--------------+ FV DistalFull                                                        +---------+---------------+---------+-----------+----------+--------------+ PFV      Full                    Yes                                 +---------+---------------+---------+-----------+----------+--------------+ POP      Full           No       Yes                                 +---------+---------------+---------+-----------+----------+--------------+ PTV      Full                    Yes                                 +---------+---------------+---------+-----------+----------+--------------+  PERO     Full                    Yes                                 +---------+---------------+---------+-----------+----------+--------------+   Right Technical Findings: Not visualized segments include SFJ, CFV.  +---------+---------------+---------+-----------+----------+--------------+ LEFT     CompressibilityPhasicitySpontaneityPropertiesThrombus Aging +---------+---------------+---------+-----------+----------+--------------+ CFV      Full           No       Yes                                  +---------+---------------+---------+-----------+----------+--------------+ FV Prox  Full                                                        +---------+---------------+---------+-----------+----------+--------------+ FV Mid   Full                                                        +---------+---------------+---------+-----------+----------+--------------+ FV Distal                        Yes                                 +---------+---------------+---------+-----------+----------+--------------+ POP      Full           No       Yes                                 +---------+---------------+---------+-----------+----------+--------------+ PTV      Full                                                        +---------+---------------+---------+-----------+----------+--------------+ PERO     Full                                                        +---------+---------------+---------+-----------+----------+--------------+   Left Technical Findings: Not visualized segments include SFJ, PFV, limited evaluation of PTV and peroneal veins.   Summary: RIGHT: - There is no evidence of deep vein thrombosis in the lower extremity. However, portions of this examination were limited- see technologist comments above.  - No cystic structure found in the popliteal fossa.  LEFT: - There is no evidence of deep vein thrombosis in the lower extremity. However, portions of this examination were limited- see technologist comments above.  - No cystic structure found in the popliteal fossa.  Bilateral lower extremity venous flow  is pulsatile, suggestive of possibly elevated right heart pressure.  *See table(s) above for measurements and observations. Electronically signed by Monica Martinez MD on 11/12/2019 at 5:07:25 PM.    Final     Microbiology: Recent Results (from the past 240 hour(s))  Respiratory Panel by RT PCR (Flu A&B, Covid) - Nasopharyngeal Swab      Status: None   Collection Time: 11/09/19  7:37 PM   Specimen: Nasopharyngeal Swab  Result Value Ref Range Status   SARS Coronavirus 2 by RT PCR NEGATIVE NEGATIVE Final    Comment: (NOTE) SARS-CoV-2 target nucleic acids are NOT DETECTED.  The SARS-CoV-2 RNA is generally detectable in upper respiratoy specimens during the acute phase of infection. The lowest concentration of SARS-CoV-2 viral copies this assay can detect is 131 copies/mL. A negative result does not preclude SARS-Cov-2 infection and should not be used as the sole basis for treatment or other patient management decisions. A negative result may occur with  improper specimen collection/handling, submission of specimen other than nasopharyngeal swab, presence of viral mutation(s) within the areas targeted by this assay, and inadequate number of viral copies (<131 copies/mL). A negative result must be combined with clinical observations, patient history, and epidemiological information. The expected result is Negative.  Fact Sheet for Patients:  PinkCheek.be  Fact Sheet for Healthcare Providers:  GravelBags.it  This test is no t yet approved or cleared by the [place] FDA and  has been authorized for detection and/or diagnosis of SARS-CoV-2 by FDA under an Emergency Use Authorization (EUA). This EUA will remain  in effect (meaning this test can be used) for the duration of the COVID-19 declaration under Section 564(b)(1) of the Act, 21 U.S.C. section 360bbb-3(b)(1), unless the authorization is terminated or revoked sooner.     Influenza A by PCR NEGATIVE NEGATIVE Final   Influenza B by PCR NEGATIVE NEGATIVE Final    Comment: (NOTE) The Xpert Xpress SARS-CoV-2/FLU/RSV assay is intended as an aid in  the diagnosis of influenza from Nasopharyngeal swab specimens and  should not be used as a sole basis for treatment. Nasal washings and  aspirates are  unacceptable for Xpert Xpress SARS-CoV-2/FLU/RSV  testing.  Fact Sheet for Patients: PinkCheek.be  Fact Sheet for Healthcare Providers: GravelBags.it  This test is not yet approved or cleared by the [place] FDA and  has been authorized for detection and/or diagnosis of SARS-CoV-2 by  FDA under an Emergency Use Authorization (EUA). This EUA will remain  in effect (meaning this test can be used) for the duration of the  Covid-19 declaration under Section 564(b)(1) of the Act, 21  U.S.C. section 360bbb-3(b)(1), unless the authorization is  terminated or revoked. Performed at [facility], [address], [place], [place] [address]   Urine culture     Status: Abnormal   Collection Time: 11/09/19  7:37 PM   Specimen: Urine, Random  Result Value Ref Range Status   Specimen Description   Final    URINE, RANDOM Performed at [facility], [address] [place], [place] [address]    Special Requests   Final    NONE Performed at [facility], [address] [place], [place] [address]    Culture >=100,000 COLONIES/mL ESCHERICHIA COLI (A)  Final   Report Status 11/11/2019 FINAL  Final   Organism ID, Bacteria ESCHERICHIA COLI (A)  Final      Susceptibility   Escherichia coli - MIC*    AMPICILLIN >=  32 RESISTANT Resistant     CEFAZOLIN <=4 SENSITIVE Sensitive     CEFTRIAXONE <=0.25 SENSITIVE Sensitive     CIPROFLOXACIN >=4 RESISTANT Resistant     GENTAMICIN <=1 SENSITIVE Sensitive     IMIPENEM <=0.25 SENSITIVE Sensitive     NITROFURANTOIN <=16 SENSITIVE Sensitive     TRIMETH/SULFA >=320 RESISTANT Resistant     AMPICILLIN/SULBACTAM 16 INTERMEDIATE Intermediate     PIP/TAZO <=4 SENSITIVE Sensitive     * >=100,000 COLONIES/mL ESCHERICHIA COLI  Culture, blood (routine x 2)     Status: None   Collection Time: 11/09/19  8:01 PM   Specimen: BLOOD RIGHT HAND  Result Value Ref Range Status    Specimen Description   Final    BLOOD RIGHT HAND Performed at [facility], [address] [place], [place] [address]    Special Requests   Final    BOTTLES DRAWN AEROBIC AND ANAEROBIC Blood Culture results may not be optimal due to an inadequate volume of blood received in culture bottles Performed at [facility], [address] [place], [place] [address]    Culture   Final    NO GROWTH 5 DAYS Performed at [facility], [place] [address] [place], [place] [address]    Report Status 11/14/2019 FINAL  Final  Culture, blood (routine x 2)     Status: None   Collection Time: 11/09/19  8:10 PM   Specimen: Right Antecubital; Blood  Result Value Ref Range Status   Specimen Description   Final    RIGHT ANTECUBITAL Performed at [facility], [address] [place], [place] [address]    Special Requests   Final    BOTTLES DRAWN AEROBIC AND ANAEROBIC Blood Culture adequate volume Performed at [facility], [address] [place], [place] [address]    Culture   Final    NO GROWTH 5 DAYS Performed at [facility], [place] [address] [place], [place] [address]    Report Status 11/14/2019 FINAL  Final  Aerobic/Anaerobic Culture (surgical/deep wound)     Status: None   Collection Time: 11/10/19  3:30 AM   Specimen: Abscess  Result Value Ref Range Status   Specimen Description   Final    ABSCESS Performed at [place] [address] [place], [place] [address]    Special Requests   Final    NONE Performed at [facility], [place] [address] [place], [place] [address]    Gram Stain   Final    FEW WBC SEEN FEW GRAM POSITIVE COCCI Performed at [facility], [place] [address] [place], [place] [address]    Culture   Final    RARE ESCHERICHIA COLI RARE VIRIDANS STREPTOCOCCUS RARE BACTEROIDES FRAGILIS RARE BACTEROIDES OVATUS BETA LACTAMASE POSITIVE Performed at [facility], [place] [address], [place],  [address]    Report Status 11/13/2019 FINAL  Final   Organism ID, Bacteria ESCHERICHIA COLI  Final   Organism ID, Bacteria VIRIDANS STREPTOCOCCUS  Final      Susceptibility   Escherichia coli - MIC*    AMPICILLIN >=32 RESISTANT Resistant     CEFAZOLIN <=4 SENSITIVE Sensitive     CEFEPIME <=0.12 SENSITIVE Sensitive     CEFTAZIDIME <=1 SENSITIVE Sensitive     CEFTRIAXONE <=0.25 SENSITIVE Sensitive     CIPROFLOXACIN >=4 RESISTANT Resistant     GENTAMICIN <=1 SENSITIVE Sensitive  IMIPENEM <=0.25 SENSITIVE Sensitive     TRIMETH/SULFA >=320 RESISTANT Resistant     AMPICILLIN/SULBACTAM 16 INTERMEDIATE Intermediate     PIP/TAZO <=4 SENSITIVE Sensitive     * RARE ESCHERICHIA COLI   Viridans streptococcus - MIC*    PENICILLIN <=0.06 SENSITIVE Sensitive     CEFTRIAXONE 0.25 SENSITIVE Sensitive     ERYTHROMYCIN <=0.12 SENSITIVE Sensitive     LEVOFLOXACIN 0.5 SENSITIVE Sensitive     VANCOMYCIN 0.5 SENSITIVE Sensitive     * RARE VIRIDANS STREPTOCOCCUS  MRSA PCR Screening     Status: None   Collection Time: 11/10/19  4:00 AM   Specimen: Nasopharyngeal  Result Value Ref Range Status   MRSA by PCR NEGATIVE NEGATIVE Final    Comment:        The GeneXpert MRSA Assay (FDA approved for NASAL specimens only), is one component of a comprehensive MRSA colonization surveillance program. It is not intended to diagnose MRSA infection nor to guide or monitor treatment for MRSA infections. Performed at [facility], [place] [address], [place], [place] [address]   Respiratory Panel by RT PCR (Flu A&B, Covid) - Nasopharyngeal Swab     Status: None   Collection Time: 11/17/19 11:29 AM   Specimen: Nasopharyngeal Swab  Result Value Ref Range Status   SARS Coronavirus 2 by RT PCR NEGATIVE NEGATIVE Final    Comment: (NOTE) SARS-CoV-2 target nucleic acids are NOT DETECTED.  The SARS-CoV-2 RNA is generally detectable in upper  respiratoy specimens during the acute phase of infection. The lowest concentration of SARS-CoV-2 viral copies this assay can detect is 131 copies/mL. A negative result does not preclude SARS-Cov-2 infection and should not be used as the sole basis for treatment or other patient management decisions. A negative result may occur with  improper specimen collection/handling, submission of specimen other than nasopharyngeal swab, presence of viral mutation(s) within the areas targeted by this assay, and inadequate number of viral copies (<131 copies/mL). A negative result must be combined with clinical observations, patient history, and epidemiological information. The expected result is Negative.  Fact Sheet for Patients:  PinkCheek.be  Fact Sheet for Healthcare Providers:  GravelBags.it  This test is no t yet approved or cleared by the [place] FDA and  has been authorized for detection and/or diagnosis of SARS-CoV-2 by FDA under an Emergency Use Authorization (EUA). This EUA will remain  in effect (meaning this test can be used) for the duration of the COVID-19 declaration under Section 564(b)(1) of the Act, 21 U.S.C. section 360bbb-3(b)(1), unless the authorization is terminated or revoked sooner.     Influenza A by PCR NEGATIVE NEGATIVE Final   Influenza B by PCR NEGATIVE NEGATIVE Final    Comment: (NOTE) The Xpert Xpress SARS-CoV-2/FLU/RSV assay is intended as an aid in  the diagnosis of influenza from Nasopharyngeal swab specimens and  should not be used as a sole basis for treatment. Nasal washings and  aspirates are unacceptable for Xpert Xpress SARS-CoV-2/FLU/RSV  testing.  Fact Sheet for Patients: PinkCheek.be  Fact Sheet for Healthcare Providers: GravelBags.it  This test is not yet approved or cleared by the [place] FDA and  has been  authorized for detection and/or diagnosis of SARS-CoV-2 by  FDA under an Emergency Use Authorization (EUA). This EUA will remain  in effect (meaning this test can be used) for the duration of the  Covid-19 declaration under Section 564(b)(1) of the Act, 21  U.S.C. section 360bbb-3(b)(1), unless the authorization is  terminated  or revoked. Performed at [facility], [place] [address], [place], [place] [address]      Labs: Basic Metabolic Panel: Recent Labs  Lab 11/11/19 0647 11/12/19 0605 11/13/19 0549  NA 133* 132* 132*  K 3.4* 3.8 4.0  CL 101 101 103  CO2 21* 21* 16*  GLUCOSE 165* 133* 127*  BUN 19 18 15   CREATININE 1.14 1.09 0.98  CALCIUM 8.4* 8.8* 8.3*  MG  --  1.9 1.8   Liver Function Tests: Recent Labs  Lab 11/11/19 0647  AST 16  ALT 12  ALKPHOS 70  BILITOT 1.2  PROT 5.4*  ALBUMIN 2.6*   No results for input(s): LIPASE, AMYLASE in the last 168 hours. No results for input(s): AMMONIA in the last 168 hours. CBC: Recent Labs  Lab 11/11/19 0647 11/12/19 0605 11/13/19 0549  WBC 15.1* 10.3 7.9  NEUTROABS 13.5* 8.8* 6.2  HGB 13.7 14.4 14.3  HCT 40.9 43.7 41.6  MCV 94.7 95.0 92.0  PLT 211 235 162   Cardiac Enzymes: No results for input(s): CKTOTAL, CKMB, CKMBINDEX, TROPONINI in the last 168 hours. BNP: BNP (last 3 results) No results for input(s): BNP in the last 8760 hours.  ProBNP (last 3 results) No results for input(s): PROBNP in the last 8760 hours.  CBG: Recent Labs  Lab 11/16/19 0839 11/16/19 1156 11/16/19 1628 11/16/19 1920 11/17/19 0816  GLUCAP 114* 164* 124* 188* 127*       Signed:  Kayleen Memos, MD [facility] 11/17/2019, 12:41 PM

## 2019-11-17 NOTE — TOC Transition Note (Signed)
Transition of Care [facility]) - CM/SW Discharge Note   Patient Details  Name: Gary Butler MRN: [id number] Date of Birth: 1945/08/03  [facility]) CM/SW Contact:  Lynnell Catalan, RN Phone Number: 11/17/2019, 2:33 PM   Clinical Narrative:    SNF bed offers given to pt at bedside. Pt chooses [facility]. Auth received # [id number]. DC summary sent via hub. Pt will transport via [facility] and RN will call report to [phone].   Final next level of care: Skilled Nursing Facility Barriers to Discharge: Continued Medical Work up   Patient Goals and CMS Choice Patient states their goals for this hospitalization and ongoing recovery are:: TO get home CMS Medicare.gov Compare Post Acute Care list provided to:: Patient Choice offered to / list presented to : Patient  Discharge Placement              Patient chooses bed at: [facility] Patient to be transferred to facility by: [facility] Name of family member notified: Wife Mrs Bloyd Patient and family notified of of transfer: 11/17/19  Discharge Plan and Services   Discharge Planning Services: CM Consult Post Acute Care Choice: Home Health                    HH Arranged: PT, OT, RN [facility] Agency: [place] Date [place]: 11/13/19 Time [place]: 0379 Representative spoke with at [place]: [place] Determinants of Health ([place]) Interventions     Readmission Risk Interventions No flowsheet data found.

## 2019-11-17 NOTE — Care Management Important Message (Signed)
Important Message  Patient Details  Name: Lenis Nettleton MRN: [id number] Date of Birth: 03/12/1945   Medicare Important Message Given:  Yes   [employer] Detailed Notice of Discharge letter created and saved: Yes Detailed Notice of Discharge Document Given to [place]: Yes [employer] ROI Document Created: Yes [employer] appeal documents uploaded to [employer] stite: Yes  Sherree Shankman P Maronda Caison 11/17/2019, 10:14 AM

## 2019-11-18 ENCOUNTER — Encounter: Payer: Self-pay | Admitting: *Deleted

## 2019-11-18 NOTE — Progress Notes (Signed)
Patient ID: Gary Butler, male   DOB: September 23, 1945, 74 y.o.   MRN: [id number] Patient enrolled for [employer] to ship a 30 day cardiac event monitor to: [facility],Attn: Nursing Director Beryl Meager, c/o Trevin Gartrell, [address], [place] , [place] [address]   [phone].  Facility called.  Letter with instructions mailed to same.

## 2019-11-20 ENCOUNTER — Encounter: Payer: Self-pay | Admitting: Internal Medicine

## 2019-11-20 ENCOUNTER — Non-Acute Institutional Stay (SKILLED_NURSING_FACILITY): Payer: Medicare PPO | Admitting: Internal Medicine

## 2019-11-20 DIAGNOSIS — L409 Psoriasis, unspecified: Secondary | ICD-10-CM

## 2019-11-20 DIAGNOSIS — L4050 Arthropathic psoriasis, unspecified: Secondary | ICD-10-CM

## 2019-11-20 DIAGNOSIS — R338 Other retention of urine: Secondary | ICD-10-CM | POA: Diagnosis not present

## 2019-11-20 DIAGNOSIS — E1165 Type 2 diabetes mellitus with hyperglycemia: Secondary | ICD-10-CM | POA: Diagnosis not present

## 2019-11-20 DIAGNOSIS — N390 Urinary tract infection, site not specified: Secondary | ICD-10-CM

## 2019-11-20 DIAGNOSIS — R197 Diarrhea, unspecified: Secondary | ICD-10-CM

## 2019-11-20 DIAGNOSIS — K611 Rectal abscess: Secondary | ICD-10-CM

## 2019-11-20 DIAGNOSIS — M1A09X0 Idiopathic chronic gout, multiple sites, without tophus (tophi): Secondary | ICD-10-CM

## 2019-11-20 DIAGNOSIS — D6869 Other thrombophilia: Secondary | ICD-10-CM | POA: Diagnosis not present

## 2019-11-20 DIAGNOSIS — I480 Paroxysmal atrial fibrillation: Secondary | ICD-10-CM

## 2019-11-20 DIAGNOSIS — I4891 Unspecified atrial fibrillation: Secondary | ICD-10-CM

## 2019-11-20 DIAGNOSIS — R634 Abnormal weight loss: Secondary | ICD-10-CM | POA: Diagnosis not present

## 2019-11-20 DIAGNOSIS — R5381 Other malaise: Secondary | ICD-10-CM

## 2019-11-20 DIAGNOSIS — I5032 Chronic diastolic (congestive) heart failure: Secondary | ICD-10-CM

## 2019-11-20 DIAGNOSIS — B9620 Unspecified Escherichia coli [E. coli] as the cause of diseases classified elsewhere: Secondary | ICD-10-CM

## 2019-11-20 MED ORDER — FLUOCINONIDE 0.05 % EX CREA: 1.0000 "application " | TOPICAL_CREAM | Freq: Two times a day (BID) | CUTANEOUS | 0 refills | Status: DC

## 2019-11-20 NOTE — Progress Notes (Signed)
Provider:  Rexene Edison. Mariea Clonts, D.O., C.M.D. Location:  [place] Room Number: [place]:  SNF (31)  PCP: [name], MD Patient Care Team: [name], MD as PCP - General (Family Medicine) Troy Sine, MD as PCP - Cardiology (Cardiology) [place], Jennefer Bravo, MD as Referring Physician (Dermatology) Sharyne Peach, MD as Consulting Physician (Ophthalmology) Hennie Duos, MD as Consulting Physician (Rheumatology) Kathie Rhodes, MD as Consulting Physician (Urology) Tanda Rockers, MD as Consulting Physician (Pulmonary Disease) Troy Sine, MD as Consulting Physician (Cardiology) Paralee Cancel, MD as Consulting Physician (Orthopedic Surgery) Cameron Sprang, MD as Consulting Physician (Neurology) Marzetta Board, DPM as Consulting Physician (Podiatry) Renelda Mom, MD as Referring Physician (Colon and Rectal Surgery) Richmond Campbell, MD as Consulting Physician (Gastroenterology) Rolene Course, PA-C as Physician Assistant (Internal Medicine)  Extended Emergency Contact Information Primary Emergency Contact: Nobel,Susan Address: [address]          [place], [place] [address] Johnnette Litter of [place] Phone: [phone] Mobile Phone: [phone] Relation: Spouse  Code Status: FULL CODE Goals of Care: Advanced Directive information Advanced Directives 11/22/2019  Does Patient Have a Medical Advance Directive? No  Type of Advance Directive -  Does patient want to make changes to medical advance directive? No - Patient declined  Copy of [place] in Chart? -  Would patient like information on creating a medical advance directive? No - Patient declined      Chief Complaint  Patient presents with  . New Admit To SNF    new admit for vomiting     HPI: Patient is a 74 y.o. male seen today for admission to [facility] status post hospitalization at [facility]  from September 27 through October 5 after presenting to the [facility] emergency department with frequent bouts of watery diarrhea and rectal pain.  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  He had had about 6 weeks of explosive bouts of watery diarrhea and in the past several days to weeks he was having progressively increased rectal discomfort.  It waxed and waned related to his bowel movements.  He was also having vomiting and inability to take p.o. for several days.  He had undergone an endoscopy a week prior with esophageal dilation.  Upon evaluation at [facility], he underwent CT imaging of his abdomen and pelvis which revealed an abscess 3.4 x 2.5 cm just below the base of his penis.  His initial concern for Fournier's gangrene and after discussion with urology and general surgery he was transferred to the [facility] emergency department.  Upon evaluation there Dr. Johney Maine and Dr. Claudia Desanctis felt he was suffering from a perineal abscess without Fournier's gangrene.  He was taken to the OR at [facility] by Dr. Johney Maine for incision and drainage September 28.  He was then admitted to the hospital and the hospitalist service. He was placed on broad-spectrum IV antibiotics until the cultures resulted including cefepime Flagyl and vancomycin.  His wound culture well-developed growing rare E. coli, rare viridans strep, and rare Bacteroides fragilis, rare Bacteroides Ovatus beta-lactamase positive.  His antibiotics were switched to Keflex and p.o. Flagyl on October 1.  Blood culture showed no growth.  His stay was complicated by acute urinary retention for which a Foley was placed on the 28th.  His Foley was removed October 1 but he was unable to void and in and out  cath revealed 900 cc output so therefore the catheter was replaced October 2.  He is to follow-up with Dr. Claudia Desanctis outpatient for another voiding trial in the office and to continue on Flomax.  He was taken off his home overactive bladder medication Myrbetriq.  He was also treated for an E. coli UTI.    He was noted to be in atrial fibrillation which is new onset.  His heart rate was improving postop.  Echo troponins and TSH were obtained.  TSH was 1.1 and troponin within normal limits.  2D echo on September 29 showed an ejection fraction of 60 to 25%, grade 2 diastolic dysfunction.  His CHA2DS2-VASc score was 3 and cardiology was consulted.  He was continued on his home bisoprolol.  He was then started on Eliquis on September 30.  He is to follow-up with cardiology outpatient.  He had already had stool studies performed by his PCP a week or so ago that were negative for C. difficile.  Flexi-Seal was used to avoid soiling his.  Chesaning wound.  Regarding his diabetes his last A1c was 9.1 he received a sliding scale while in the hospital.  He had severe lower extremity tenderness with palpation and was ruled out for DVT with bilateral Doppler ultrasound.  He does have significant arthritis.  He is on allopurinol for gout.  His Lodine was stopped during admission.  His blood pressures were soft during admission.  He was on bisoprolol 2.5 mg daily.  His Maxide and Micardis were held.  Lasix and potassium were initially held but later resumed.  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  He lives in [place] and has 2 cats.  Lost 100# in 8-10 weeks prior to this.  Lost appetite.  He admits he needed to lose this weight.  He is now here for PT OT, further healing of his wound and to complete his p.o. antibiotics on October 12.  He had his Pfizer Covid vaccines on February 8 and March 4 of this year.  Past Medical History:  Diagnosis Date  . Arthritis   . CHF  (congestive heart failure) ([place])   . Constipation   . Diabetes mellitus   . Heart murmur   . History of gout   . History of skin cancer   . Hypertension   . Morbid (severe) obesity due to excess calories ([place])   . OSA (obstructive sleep apnea)   . Psoriatic arthritis ([place])   . Rectal fissure   . Sleep apnea   . Tinnitus    Past Surgical History:  Procedure Laterality Date  . addenoids    . ANAL FISTULOTOMY  2019   Dr Drue Flirt, [facility]  . IRRIGATION AND DEBRIDEMENT ABSCESS N/A 11/10/2019   Procedure: INCISION AND DRAINAGE OF HIGH SCROTAL ABSCESS;  Surgeon: Michael Boston, MD;  Location: [facility] ORS;  Service: General;  Laterality: N/A;  . JOINT REPLACEMENT  2012   RT KNEE  . LUNG BIOPSY     20 YRS AGO  . RECTAL EXAM UNDER ANESTHESIA N/A 11/10/2019   Procedure: ANORECTAL EXAM UNDER ANESTHESIA;  Surgeon: Michael Boston, MD;  Location: [facility];  Service: General;  Laterality: N/A;  . TONSILLECTOMY    . TOTAL KNEE ARTHROPLASTY Bilateral 06/22/2013   Procedure: LEFT  TOTAL KNEE ARTHROPLASTY;  MEDIAL SOFT TISSUE EXPLORATION SAPHENOUS NEURECTOMY RIGHT KNEE;  Surgeon: Mauri Pole, MD;  Location: [facility];  Service: Orthopedics;  Laterality: Bilateral;    Social History   Socioeconomic History  . Marital status: Married    Spouse name: Not on file  . Number of children: 2  . Years of education: Not on file  . Highest education level: Not on file  Occupational History  . 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 Smoking status: Never Smoker  . Smokeless tobacco: Never Used  Vaping Use  . Vaping Use: Never used  Substance and Sexual Activity  . Alcohol use: Yes    Alcohol/week: 0.0 standard drinks    Comment: Once a month  . Drug use: No  . Sexual activity: Never  Other Topics Concern  . Not on file  Social History Narrative   Alcohol- 1 to 2 per month.    Social Determinants of Health   Financial Resource Strain:   .  Difficulty of Paying Living Expenses: Not on file  Food Insecurity:   . Worried About Charity fundraiser in the Last Year: Not on file  . Ran Out of Food in the Last Year: Not on file  Transportation Needs:   . Lack of Transportation (Medical): Not on file  . Lack of Transportation (Non-Medical): Not on file  Physical Activity:   . Days of Exercise per Week: Not on file  . Minutes of Exercise per Session: Not on file  Stress:   . Feeling of Stress : Not on file  Social Connections:   . Frequency of Communication with Friends and Family: Not on file  . Frequency of Social Gatherings with Friends and Family: Not on file  . Attends Religious Services: Not on file  . Active Member of Clubs or Organizations: Not on file  . Attends Archivist Meetings: Not on file  . Marital Status: Not on file    reports that he has never smoked. He has never used smokeless tobacco. He reports current alcohol use. He reports that he does not use drugs.  Functional Status Survey:    Family History  Problem Relation Age of Onset  . Emphysema Father   . Clotting disorder Father   . Arthritis Father   . Heart disease Mother   . Arthritis Mother   . Arthritis Maternal Grandmother   . Hypertension Maternal Grandmother   . Diabetes Maternal Grandmother   . Arthritis Paternal Grandmother   . Diabetes Maternal Aunt   . Diabetes Paternal Aunt     Health Maintenance  Topic Date Due  . Hepatitis C Screening  04/14/2020 (Originally 12/25/1945)  . FOOT EXAM  02/02/2020  . HEMOGLOBIN A1C  04/24/2020  . OPHTHALMOLOGY EXAM  08/24/2020  . TETANUS/TDAP  02/18/2024  . COLONOSCOPY  08/07/2027  . INFLUENZA VACCINE  Completed  . COVID-19 Vaccine  Completed  . PNA vac Low Risk Adult  Completed    Allergies  Allergen Reactions  . Penicillins Hives    Patient reports full body hives that required medical treatment when he was in his 60s or 71s. He tolerated cephalosporins.   [name] [Pregabalin]  Swelling  . Levofloxacin Swelling    No facility-administered encounter medications on file as of 11/20/2019.   Outpatient Encounter Medications as of 11/20/2019  Medication Sig  . Albuterol Sulfate (PROAIR RESPICLICK) 811 (90  Base) MCG/ACT AEPB Inhale 2 puffs into the lungs every 6 (six) hours as needed (for wheezing or shortness of breath).  Marland Kitchen allopurinol (ZYLOPRIM) 300 MG tablet TAKE 1 TABLET(300 MG) BY MOUTH DAILY (Patient taking differently: 300 mg daily. )  . apixaban (ELIQUIS) 5 MG TABS tablet Take 1 tablet (5 mg total) by mouth 2 (two) times daily.  Marland Kitchen aspirin 81 MG tablet Take 81 mg by mouth daily.  Marland Kitchen atorvastatin (LIPITOR) 40 MG tablet TAKE 1 TABLET(40 MG) BY MOUTH EVERY EVENING (Patient taking differently: Take 40 mg by mouth daily. )  . bisacodyl (DULCOLAX) 10 MG suppository Place 10 mg rectally as needed for moderate constipation (if not relieved by MOM).   . bisoprolol (ZEBETA) 5 MG tablet Take 0.5 tablets (2.5 mg total) by mouth daily.  . cephALEXin (KEFLEX) 500 MG capsule Take 2 capsules (1,000 mg total) by mouth 3 (three) times daily for 10 days. (Patient taking differently: Take 1,000 mg by mouth 3 (three) times daily. 7 day supply)  . cetirizine (ZYRTEC) 10 MG tablet TAKE 1 TABLET(10 MG) BY MOUTH DAILY (Patient taking differently: Take 10 mg by mouth daily. TAKE 1 TABLET(10 MG) BY MOUTH DAILY)  . Cholecalciferol (VITAMIN D) 2000 UNITS tablet Take 2,000 Units by mouth daily.  . Cyanocobalamin (B-12) 2000 MCG TABS Take 2,000 mcg by mouth daily.   Marland Kitchen donepezil (ARICEPT) 10 MG tablet Take 1 tablet (10 mg total) by mouth at bedtime.  . empagliflozin (JARDIANCE) 10 MG TABS tablet Take 10 mg by mouth daily before breakfast.  . fluticasone (FLONASE) 50 MCG/ACT nasal spray Place 2 sprays into both nostrils daily. (Patient not taking: Reported on 11/22/2019)  . folic acid (FOLVITE) 1 MG tablet Take 1 mg by mouth daily.   . furosemide (LASIX) 20 MG tablet TAKE 1 TABLET(20 MG) BY MOUTH  DAILY (Patient taking differently: Take 20 mg by mouth daily. TAKE 1 TABLET(20 MG) BY MOUTH DAILY)  . magnesium hydroxide (MILK OF MAGNESIA) 400 MG/5ML suspension Take 30 mLs by mouth daily as needed for mild constipation. If no bowel movement in 3 days  . meloxicam (MOBIC) 15 MG tablet Take 1 tablet by mouth daily. (Patient taking differently: Take 15 mg by mouth daily. Take 1 tablet by mouth daily.)  . metroNIDAZOLE (FLAGYL) 500 MG tablet Take 1 tablet (500 mg total) by mouth every 8 (eight) hours for 10 days. (Patient taking differently: Take 500 mg by mouth every 8 (eight) hours. 7 day supply)  . omeprazole (PRILOSEC) 40 MG capsule TAKE 1 CAPSULE BY MOUTH 30 TO 60 MINUTES BEFORE YOUR FIRST AND LAST MEAL OF THE DAY (Patient taking differently: Take 40 mg by mouth in the morning and at bedtime. TAKE 1 CAPSULE BY MOUTH 30 TO 60 MINUTES BEFORE YOUR FIRST AND LAST MEAL OF THE DAY)  . polyethylene glycol (MIRALAX / GLYCOLAX) 17 g packet Take 17 g by mouth daily as needed for mild constipation. (Patient taking differently: Take 17 g by mouth daily as needed for mild constipation (if not relieved by saline enema). )  . potassium chloride (MICRO-K) 10 MEQ CR capsule TAKE 1 CAPSULE BY MOUTH EVERY DAY (Patient taking differently: Take 10 mEq by mouth daily. TAKE 1 CAPSULE BY MOUTH EVERY DAY)  . psyllium (HYDROCIL/METAMUCIL) 95 % PACK Take 1 packet by mouth 2 (two) times daily.  . sertraline (ZOLOFT) 50 MG tablet TAKE 1 TABLET(50 MG) BY MOUTH DAILY. (Patient taking differently: Take 50 mg by mouth daily. TAKE 1 TABLET(50 MG) BY  MOUTH DAILY.)  . tamsulosin (FLOMAX) 0.4 MG CAPS capsule Take 1 capsule (0.4 mg total) by mouth daily after supper.  Marland Kitchen tiZANidine (ZANAFLEX) 4 MG tablet Take 1 tablet (4 mg total) by mouth every 6 (six) hours as needed for muscle spasms.  . fluocinonide cream (LIDEX) 8.46 % Apply 1 application topically 2 (two) times daily. To arms for psoriasis (Patient not taking: Reported on 11/22/2019)   . [DISCONTINUED] fluocinonide cream (LIDEX) 0.05 %   . [DISCONTINUED] nystatin ointment (MYCOSTATIN) Apply 1 application topically 2 (two) times daily.  . [DISCONTINUED] UNABLE TO FIND Med Name: BiPap with [facility]    Review of Systems  Constitutional: Positive for malaise/fatigue and weight loss. Negative for chills and fever.  HENT: Negative for congestion, hearing loss and sore throat.   Eyes: Negative for blurred vision.  Respiratory: Negative for cough and shortness of breath.   Cardiovascular: Positive for leg swelling. Negative for chest pain and palpitations.  Gastrointestinal: Positive for heartburn. Negative for abdominal pain, blood in stool, constipation, diarrhea, melena, nausea and vomiting.  Genitourinary: Negative for dysuria.       Foley in place with light yellow urine  Musculoskeletal: Positive for back pain and joint pain. Negative for falls.  Skin: Negative for itching and rash.       Perineal wound  Neurological: Positive for weakness. Negative for dizziness and loss of consciousness.  Psychiatric/Behavioral: Positive for memory loss. Negative for depression. The patient is not nervous/anxious and does not have insomnia.     Vitals:   11/20/19 1006  BP: 115/69  Pulse: 68  Temp: 97.6 F (36.4 C)  Weight: (!) 302 lb 14.4 oz (137.4 kg)  Height: 5\' 10"  (1.778 m)   Body mass index is 43.46 kg/m. Physical Exam Vitals reviewed.  Constitutional:      General: He is not in acute distress.    Appearance: Normal appearance. He is obese. He is not toxic-appearing.  HENT:     Head: Normocephalic and atraumatic.     Right Ear: External ear normal.     Left Ear: External ear normal.     Nose: Nose normal.     Mouth/Throat:     Pharynx: Oropharynx is clear.  Eyes:     Extraocular Movements: Extraocular movements intact.     Conjunctiva/sclera: Conjunctivae normal.     Pupils: Pupils are equal, round, and reactive to light.  Cardiovascular:     Rate and Rhythm:  Rhythm irregular.     Heart sounds: No murmur heard.   Pulmonary:     Effort: Pulmonary effort is normal.     Breath sounds: Normal breath sounds. No wheezing, rhonchi or rales.  Abdominal:     General: Bowel sounds are normal. There is no distension.     Palpations: Abdomen is soft.     Tenderness: There is no abdominal tenderness. There is no guarding or rebound.  Genitourinary:    Comments: Foley in place with pale yellow urine Musculoskeletal:        General: Normal range of motion.     Cervical back: Neck supple.     Right lower leg: Edema present.     Left lower leg: Edema present.  Lymphadenopathy:     Cervical: No cervical adenopathy.  Skin:    Capillary Refill: Capillary refill takes less than 2 seconds.     Comments: Small slit wound posterior to scrotum with minimal drainage now  Neurological:     General: No focal deficit present.  Mental Status: He is alert and oriented to person, place, and time.     Cranial Nerves: No cranial nerve deficit.     Motor: Weakness present.     Comments: Resting in bed about to have his lunch  Psychiatric:        Mood and Affect: Mood normal.        Behavior: Behavior normal.     Labs reviewed: Basic Metabolic Panel: Recent Labs    11/10/19 0958 11/11/19 0647 11/12/19 0605 11/12/19 0605 11/13/19 0549 11/13/19 0549 11/21/19 2255 11/22/19 1413 11/23/19 0056  NA 130*   < > 132*   < > 132*   < > 139 138 138  K 3.3*   < > 3.8   < > 4.0   < > 3.2* 3.2* 4.4  CL 96*   < > 101   < > 103   < > 103 106 104  CO2 23   < > 21*   < > 16*   < > 24 23 22   GLUCOSE 231*   < > 133*   < > 127*   < > 142* 145* 115*  BUN 18   < > 18   < > 15   < > 7* 6* 6*  CREATININE 1.22   < > 1.09   < > 0.98   < > 0.98 0.90 0.85  CALCIUM 8.7*   < > 8.8*   < > 8.3*   < > 8.8* 8.2* 8.3*  MG 1.8  --  1.9  --  1.8  --   --   --   --    < > = values in this interval not displayed.   Liver Function Tests: Recent Labs    11/11/19 0647 11/21/19 2255  11/22/19 1413  AST 16 22 20   ALT 12 13 12   ALKPHOS 70 66 53  BILITOT 1.2 1.2 0.9  PROT 5.4* 5.9* 5.3*  ALBUMIN 2.6* 2.9* 2.6*   Recent Labs    10/29/19 1659 11/09/19 1833  LIPASE 21 22   No results for input(s): AMMONIA in the last 8760 hours. CBC: Recent Labs    11/13/19 0549 11/13/19 0549 11/21/19 2255 11/22/19 0500 11/22/19 1413 11/22/19 2117 11/23/19 0056  WBC 7.9  --  7.3  --   --   --  6.5  NEUTROABS 6.2  --  5.4  --   --   --  4.7  HGB 14.3   < > 15.0   < > 13.2 12.9* 12.7*  HCT 41.6  --  45.2  --   --   --  38.7*  MCV 92.0  --  94.2  --   --   --  97.0  PLT 162  --  254  --   --   --  242   < > = values in this interval not displayed.   Cardiac Enzymes: No results for input(s): CKTOTAL, CKMB, CKMBINDEX, TROPONINI in the last 8760 hours. BNP: Invalid input(s): POCBNP Lab Results  Component Value Date   HGBA1C 9.1 (H) 10/26/2019   Lab Results  Component Value Date   TSH 1.141 11/10/2019   Lab Results  Component Value Date   VITAMINB12 238 04/29/2014   Lab Results  Component Value Date   FOLATE >23.3 04/29/2014   No results found for: IRON, TIBC, FERRITIN  Imaging and Procedures obtained prior to SNF admission: CT Abdomen Pelvis W Contrast  Result Date: 11/09/2019 CLINICAL DATA:  Bowel  obstruction suspected vomiting since esophageal dilation, unable to tolerate PO Increasing weakness. EXAM: CT ABDOMEN AND PELVIS WITH CONTRAST TECHNIQUE: Multidetector CT imaging of the abdomen and pelvis was performed using the standard protocol following bolus administration of intravenous contrast. CONTRAST:  22mL OMNIPAQUE IOHEXOL 300 MG/ML  SOLN COMPARISON:  CT 11 days ago 10/29/2019 FINDINGS: Lower chest: Again seen subpleural fat at the lung bases. No pleural fluid. No lower pneumomediastinum or esophageal wall thickening. No focal consolidation. Hepatobiliary: Decreased hepatic density consistent with steatosis. Previous geographic fatty distribution is more  homogeneous on the current exam, likely related to differences in contrast timing. There is no discrete focal hepatic lesion. Layering gallstones without pericholecystic inflammation. There is no biliary dilatation. No visualized choledocholithiasis. Pancreas: Fatty atrophy.  No ductal dilatation or inflammation. Spleen: Normal in size without focal abnormality. Adrenals/Urinary Tract: Normal right adrenal gland. Punctate left adrenal calcification unchanged. No hydronephrosis or perinephric edema. Again seen thinning of bilateral renal parenchyma. There are bilateral renal cysts are unchanged since recent exam. The urinary bladder is distended. No bladder wall thickening. Bladder(volume = 1580 cm^3). Stomach/Bowel: No distal esophageal wall thickening. Stomach is decompressed. Normal positioning of the duodenum and ligament of Treitz. Decompressed small bowel without obstruction or inflammation. Normal appendix. Small volume of stool throughout the colon. Sigmoid colon is tortuous. Distal colonic diverticulosis without focal diverticulitis. There is no colonic wall thickening or inflammation. Vascular/Lymphatic: Aorto bi-iliac atherosclerosis without aortic aneurysm. The portal vein is patent. No acute vascular finding. No abdominopelvic adenopathy. Reproductive: Normal sized prostate gland, see details below regarding adjacent soft tissue air. Other: There is edema and soft tissue gas involving the left perineum extending to the base of the penis. Small perineal fluid collection measures 3.4 x 2.5 cm, series 2, image 87. Tracking soft tissue air extends superiorly along the inferior margin of the prostate gland. No abdominopelvic ascites. Musculoskeletal: Multilevel degenerative change throughout the spine. Degenerative change of the hips. No bony destruction or acute osseous abnormality. IMPRESSION: 1. No bowel obstruction. 2. Midline perineal abscess measuring 3.4 x 2.5 cm just below the base of the penis  anterior to the rectum, with patchy soft tissue air tracking in the left perineum, into the subcutaneous tissues. Findings highly suspicious for Fournier's gangrene. 3. Distended urinary bladder without bladder wall thickening. 4. Hepatic steatosis. Cholelithiasis without gallbladder inflammation. 5. Colonic diverticulosis without diverticulitis. Aortic Atherosclerosis (ICD10-I70.0). These results were called by telephone at the time of interpretation on 11/09/2019 at 9:17 pm to Dr Apolonio Schneiders LITTLE , who verbally acknowledged these results. Electronically Signed   By: Keith Rake M.D.   On: 11/09/2019 21:18    Assessment/Plan 1. Suprasphincteric perirectal abscess s/p I&D 11/10/2019 -recovering well it appears -f/u with Dr. Johney Maine as planned -complete abx course 10/12   2. Acute urinary retention s/p Foley 11/10/2019 -foley in place pending urology f/u with Dr. Claudia Desanctis at [place] -off his myrbetriq -cont flomax  3. Diarrhea, unspecified type -no mention of this here thus far, but had several weeks of it negative for bacterial etiology  4. Paroxysmal atrial fibrillation (HCC) -new onset during admission -now on eliquis, rate controlled with bystolic he already took  5. Hypercoagulable state due to atrial fibrillation ([place]) -cont eliquis therapy, f/u cbc, bmp at one week  6. Poorly controlled diabetes mellitus ([place]) -back on home meds for this, seemed like his appetite was better than it had been Lab Results  Component Value Date   HGBA1C 9.1 (H) 10/26/2019   7. Chronic diastolic congestive  heart failure (HCC) -cont home lasix and potassium--edema in legs improving with this and elevation (had been held during hospitalization)  8. Weight loss -Reports having lost over 100 pounds just in few weeks time -Had considerable diarrhea -If this persists on completion of his treatment for this infection and his diet is not changed, he may need further work-up  9. E. coli UTI -also treated  with the abx he's gotten for his abscess  10. Chronic gout of multiple sites, unspecified cause -cont home allopurinol  11. Physical deconditioning -here for PT, OT and wound care and abx completion with goal to return home  12. Psoriasis -ordered his topical therapy for his arms from our pharmacy here   50. Psoriatic arthritis ([place]) -with lumbar pain and knee pain (knees may be OA based on problem list and obesity) -was taken off his nsaid at hospital    Family/ staff Communication: d/w snf nurse  Labs/tests ordered:  Cbc, bmp at one week  [employer] L. Sadey Yandell, D.O. [facility] [address], [place] [address] Cell Phone (Mon-Fri 8am-5pm):  [phone] On Call:  [phone] & follow prompts after 5pm & weekends Office Phone:  [phone] Office Fax:  [phone]

## 2019-11-21 ENCOUNTER — Other Ambulatory Visit: Payer: Self-pay | Admitting: Family Medicine

## 2019-11-21 ENCOUNTER — Inpatient Hospital Stay (HOSPITAL_COMMUNITY)
Admission: EM | Admit: 2019-11-21 | Discharge: 2019-11-25 | DRG: 920 | Disposition: A | Discharge status: Skilled Nursing Facility | Payer: Medicare PPO | Source: Skilled Nursing Facility | Attending: Internal Medicine | Admitting: Internal Medicine

## 2019-11-21 ENCOUNTER — Other Ambulatory Visit: Payer: Self-pay

## 2019-11-21 DIAGNOSIS — K612 Anorectal abscess: Secondary | ICD-10-CM | POA: Diagnosis present

## 2019-11-21 DIAGNOSIS — L02215 Cutaneous abscess of perineum: Secondary | ICD-10-CM | POA: Diagnosis not present

## 2019-11-21 DIAGNOSIS — Z6841 Body Mass Index (BMI) 40.0 and over, adult: Secondary | ICD-10-CM | POA: Diagnosis not present

## 2019-11-21 DIAGNOSIS — G4733 Obstructive sleep apnea (adult) (pediatric): Secondary | ICD-10-CM | POA: Diagnosis present

## 2019-11-21 DIAGNOSIS — F0390 Unspecified dementia without behavioral disturbance: Secondary | ICD-10-CM | POA: Diagnosis present

## 2019-11-21 DIAGNOSIS — Z7401 Bed confinement status: Secondary | ICD-10-CM | POA: Diagnosis not present

## 2019-11-21 DIAGNOSIS — K648 Other hemorrhoids: Secondary | ICD-10-CM | POA: Diagnosis present

## 2019-11-21 DIAGNOSIS — I4891 Unspecified atrial fibrillation: Secondary | ICD-10-CM | POA: Diagnosis not present

## 2019-11-21 DIAGNOSIS — I509 Heart failure, unspecified: Secondary | ICD-10-CM

## 2019-11-21 DIAGNOSIS — E43 Unspecified severe protein-calorie malnutrition: Secondary | ICD-10-CM | POA: Diagnosis not present

## 2019-11-21 DIAGNOSIS — Z85828 Personal history of other malignant neoplasm of skin: Secondary | ICD-10-CM

## 2019-11-21 DIAGNOSIS — Z7901 Long term (current) use of anticoagulants: Secondary | ICD-10-CM | POA: Diagnosis not present

## 2019-11-21 DIAGNOSIS — Z7984 Long term (current) use of oral hypoglycemic drugs: Secondary | ICD-10-CM

## 2019-11-21 DIAGNOSIS — Z833 Family history of diabetes mellitus: Secondary | ICD-10-CM

## 2019-11-21 DIAGNOSIS — Z8249 Family history of ischemic heart disease and other diseases of the circulatory system: Secondary | ICD-10-CM | POA: Diagnosis not present

## 2019-11-21 DIAGNOSIS — I480 Paroxysmal atrial fibrillation: Secondary | ICD-10-CM | POA: Diagnosis present

## 2019-11-21 DIAGNOSIS — Z888 Allergy status to other drugs, medicaments and biological substances status: Secondary | ICD-10-CM

## 2019-11-21 DIAGNOSIS — M6281 Muscle weakness (generalized): Secondary | ICD-10-CM | POA: Diagnosis not present

## 2019-11-21 DIAGNOSIS — Z791 Long term (current) use of non-steroidal anti-inflammatories (NSAID): Secondary | ICD-10-CM

## 2019-11-21 DIAGNOSIS — Z96653 Presence of artificial knee joint, bilateral: Secondary | ICD-10-CM | POA: Diagnosis present

## 2019-11-21 DIAGNOSIS — Z832 Family history of diseases of the blood and blood-forming organs and certain disorders involving the immune mechanism: Secondary | ICD-10-CM

## 2019-11-21 DIAGNOSIS — R338 Other retention of urine: Secondary | ICD-10-CM | POA: Diagnosis present

## 2019-11-21 DIAGNOSIS — K219 Gastro-esophageal reflux disease without esophagitis: Secondary | ICD-10-CM | POA: Diagnosis present

## 2019-11-21 DIAGNOSIS — R339 Retention of urine, unspecified: Secondary | ICD-10-CM | POA: Diagnosis present

## 2019-11-21 DIAGNOSIS — M109 Gout, unspecified: Secondary | ICD-10-CM | POA: Diagnosis present

## 2019-11-21 DIAGNOSIS — R319 Hematuria, unspecified: Secondary | ICD-10-CM | POA: Diagnosis present

## 2019-11-21 DIAGNOSIS — Z8719 Personal history of other diseases of the digestive system: Secondary | ICD-10-CM | POA: Diagnosis not present

## 2019-11-21 DIAGNOSIS — K921 Melena: Secondary | ICD-10-CM | POA: Diagnosis present

## 2019-11-21 DIAGNOSIS — J069 Acute upper respiratory infection, unspecified: Secondary | ICD-10-CM | POA: Diagnosis not present

## 2019-11-21 DIAGNOSIS — N401 Enlarged prostate with lower urinary tract symptoms: Secondary | ICD-10-CM | POA: Diagnosis present

## 2019-11-21 DIAGNOSIS — E1165 Type 2 diabetes mellitus with hyperglycemia: Secondary | ICD-10-CM | POA: Diagnosis present

## 2019-11-21 DIAGNOSIS — M2550 Pain in unspecified joint: Secondary | ICD-10-CM | POA: Diagnosis not present

## 2019-11-21 DIAGNOSIS — Z20822 Contact with and (suspected) exposure to covid-19: Secondary | ICD-10-CM | POA: Diagnosis present

## 2019-11-21 DIAGNOSIS — R7989 Other specified abnormal findings of blood chemistry: Secondary | ICD-10-CM | POA: Diagnosis present

## 2019-11-21 DIAGNOSIS — L4050 Arthropathic psoriasis, unspecified: Secondary | ICD-10-CM | POA: Diagnosis present

## 2019-11-21 DIAGNOSIS — R791 Abnormal coagulation profile: Secondary | ICD-10-CM | POA: Diagnosis present

## 2019-11-21 DIAGNOSIS — I10 Essential (primary) hypertension: Secondary | ICD-10-CM | POA: Diagnosis not present

## 2019-11-21 DIAGNOSIS — Z825 Family history of asthma and other chronic lower respiratory diseases: Secondary | ICD-10-CM | POA: Diagnosis not present

## 2019-11-21 DIAGNOSIS — Z8261 Family history of arthritis: Secondary | ICD-10-CM

## 2019-11-21 DIAGNOSIS — K8020 Calculus of gallbladder without cholecystitis without obstruction: Secondary | ICD-10-CM | POA: Diagnosis not present

## 2019-11-21 DIAGNOSIS — Z880 Allergy status to penicillin: Secondary | ICD-10-CM

## 2019-11-21 DIAGNOSIS — M1990 Unspecified osteoarthritis, unspecified site: Secondary | ICD-10-CM | POA: Diagnosis present

## 2019-11-21 DIAGNOSIS — N400 Enlarged prostate without lower urinary tract symptoms: Secondary | ICD-10-CM | POA: Diagnosis present

## 2019-11-21 DIAGNOSIS — I110 Hypertensive heart disease with heart failure: Secondary | ICD-10-CM | POA: Diagnosis present

## 2019-11-21 DIAGNOSIS — K5900 Constipation, unspecified: Secondary | ICD-10-CM | POA: Diagnosis present

## 2019-11-21 DIAGNOSIS — R5381 Other malaise: Secondary | ICD-10-CM | POA: Diagnosis not present

## 2019-11-21 DIAGNOSIS — K5790 Diverticulosis of intestine, part unspecified, without perforation or abscess without bleeding: Secondary | ICD-10-CM | POA: Diagnosis not present

## 2019-11-21 DIAGNOSIS — L7622 Postprocedural hemorrhage and hematoma of skin and subcutaneous tissue following other procedure: Secondary | ICD-10-CM | POA: Diagnosis present

## 2019-11-21 DIAGNOSIS — R41841 Cognitive communication deficit: Secondary | ICD-10-CM | POA: Diagnosis not present

## 2019-11-21 DIAGNOSIS — Z7982 Long term (current) use of aspirin: Secondary | ICD-10-CM

## 2019-11-21 DIAGNOSIS — K5730 Diverticulosis of large intestine without perforation or abscess without bleeding: Secondary | ICD-10-CM | POA: Diagnosis present

## 2019-11-21 DIAGNOSIS — K611 Rectal abscess: Secondary | ICD-10-CM | POA: Diagnosis not present

## 2019-11-21 DIAGNOSIS — I69828 Other speech and language deficits following other cerebrovascular disease: Secondary | ICD-10-CM | POA: Diagnosis not present

## 2019-11-21 DIAGNOSIS — K429 Umbilical hernia without obstruction or gangrene: Secondary | ICD-10-CM | POA: Diagnosis not present

## 2019-11-21 DIAGNOSIS — E876 Hypokalemia: Secondary | ICD-10-CM | POA: Diagnosis present

## 2019-11-21 DIAGNOSIS — R2681 Unsteadiness on feet: Secondary | ICD-10-CM | POA: Diagnosis not present

## 2019-11-21 DIAGNOSIS — Z48817 Encounter for surgical aftercare following surgery on the skin and subcutaneous tissue: Secondary | ICD-10-CM | POA: Diagnosis not present

## 2019-11-21 DIAGNOSIS — I5032 Chronic diastolic (congestive) heart failure: Secondary | ICD-10-CM | POA: Diagnosis present

## 2019-11-21 DIAGNOSIS — Z79899 Other long term (current) drug therapy: Secondary | ICD-10-CM

## 2019-11-21 DIAGNOSIS — K922 Gastrointestinal hemorrhage, unspecified: Secondary | ICD-10-CM | POA: Diagnosis present

## 2019-11-21 DIAGNOSIS — K760 Fatty (change of) liver, not elsewhere classified: Secondary | ICD-10-CM | POA: Diagnosis not present

## 2019-11-21 HISTORY — DX: Heart failure, unspecified: I50.9

## 2019-11-21 LAB — CBC WITH DIFFERENTIAL/PLATELET
Abs Immature Granulocytes: 0.06 10*3/uL (ref 0.00–0.07)
Basophils Absolute: 0.1 10*3/uL (ref 0.0–0.1)
Basophils Relative: 1 %
Eosinophils Absolute: 0.2 10*3/uL (ref 0.0–0.5)
Eosinophils Relative: 3 %
HCT: 45.2 % (ref 39.0–52.0)
Hemoglobin: 15 g/dL (ref 13.0–17.0)
Immature Granulocytes: 1 %
Lymphocytes Relative: 15 %
Lymphs Abs: 1.1 10*3/uL (ref 0.7–4.0)
MCH: 31.3 pg (ref 26.0–34.0)
MCHC: 33.2 g/dL (ref 30.0–36.0)
MCV: 94.2 fL (ref 80.0–100.0)
Monocytes Absolute: 0.5 10*3/uL (ref 0.1–1.0)
Monocytes Relative: 6 %
Neutro Abs: 5.4 10*3/uL (ref 1.7–7.7)
Neutrophils Relative %: 74 %
Platelets: 254 10*3/uL (ref 150–400)
RBC: 4.8 MIL/uL (ref 4.22–5.81)
RDW: 17 % — ABNORMAL HIGH (ref 11.5–15.5)
WBC: 7.3 10*3/uL (ref 4.0–10.5)
nRBC: 0 % (ref 0.0–0.2)

## 2019-11-21 LAB — COMPREHENSIVE METABOLIC PANEL
ALT: 13 U/L (ref 0–44)
AST: 22 U/L (ref 15–41)
Albumin: 2.9 g/dL — ABNORMAL LOW (ref 3.5–5.0)
Alkaline Phosphatase: 66 U/L (ref 38–126)
Anion gap: 12 (ref 5–15)
BUN: 7 mg/dL — ABNORMAL LOW (ref 8–23)
CO2: 24 mmol/L (ref 22–32)
Calcium: 8.8 mg/dL — ABNORMAL LOW (ref 8.9–10.3)
Chloride: 103 mmol/L (ref 98–111)
Creatinine, Ser: 0.98 mg/dL (ref 0.61–1.24)
GFR, Estimated: >60 mL/min (ref >60)
Glucose, Bld: 142 mg/dL — ABNORMAL HIGH (ref 70–99)
Potassium: 3.2 mmol/L — ABNORMAL LOW (ref 3.5–5.1)
Sodium: 139 mmol/L (ref 135–145)
Total Bilirubin: 1.2 mg/dL (ref 0.3–1.2)
Total Protein: 5.9 g/dL — ABNORMAL LOW (ref 6.5–8.1)

## 2019-11-21 MED ORDER — LACTATED RINGERS IV BOLUS
1000.0000 mL | Freq: Once | INTRAVENOUS | Status: AC
  Administered 2019-11-21: 1000 mL via INTRAVENOUS

## 2019-11-21 NOTE — ED Provider Notes (Signed)
[facility] DEPT Provider Note   CSN: [id number] Arrival date & time: 11/21/19  2232     History Chief Complaint  Patient presents with  . Rectal Bleeding    Gary Butler is a 74 y.o. male.  Patient does not the hospital approximately 4 days ago after being admitted for about 10 days secondary to perianal abscess that was drained by surgery.  He had been doing better he been working with physical therapy at his rehab facility but then today he had a large bloody bowel movement.  States it was bright red blood.  No history of the same.  States he has had colonoscopies in the past with some polyps here and there but no other abnormalities.  He states that he is positive this came from his rectum and was not bleeding from the surgical site.  Also noted to have what appears to be yeast infection under his pannus.  Patient states he feels weak now.  He is on Eliquis for atrial fibrillation which is a new medication.  He has had watery diarrhea for months that has been worked up by his PCP.        Past Medical History:  Diagnosis Date  . Arthritis   . Constipation   . Diabetes mellitus   . Heart murmur   . History of gout   . History of skin cancer   . Hypertension   . Morbid (severe) obesity due to excess calories ([place])   . OSA (obstructive sleep apnea)   . Psoriatic arthritis ([place])   . Rectal fissure   . Sleep apnea   . Tinnitus     Patient Active Problem List   Diagnosis Date Noted  . Hematochezia 11/22/2019  . Lower GI bleed 11/22/2019  . Acute urinary retention s/p Foley 11/10/2019 11/10/2019  . Suprasphincteric perirectal abscess s/p I&D 11/10/2019 11/10/2019  . Diarrhea 11/10/2019  . Perineal abscess 11/10/2019  . GERD without esophagitis 11/10/2019  . Atrial fibrillation ([place]) 11/10/2019  . Poorly controlled diabetes mellitus ([place]) 11/09/2019  . Dyspnea on exertion 02/19/2017  . Bilateral lower extremity edema 07/06/2015  . CHF  (congestive heart failure) ([place]) 06/27/2015  . Fungal dermatitis 06/27/2015  . Edema 06/20/2015  . Increasing shortness of breath 06/20/2015  . Lumbar radiculopathy, acute 03/09/2015  . Essential hypertension 12/27/2014  . Uncontrolled type 2 diabetes mellitus with hyperglycemia, with long-term current use of insulin ([place]) 12/27/2014  . Mild aortic stenosis 06/28/2014  . Memory loss 04/29/2014  . Tinnitus of both ears 02/17/2014  . Wellness examination 12/04/2013  . Constipation due to slow transit 08/18/2013  . S/P left TKA 06/22/2013  . Meniere disease 04/21/2013  . Benign paroxysmal positional vertigo 04/21/2013  . History of methicillin resistant staphylococcus aureus (MRSA) 2015  . Psoriatic arthritis ([place]) 05/14/2012  . General medical exam 01/28/2012  . Cellulitis of eyelid 09/25/2011  . Diverticulitis 06/20/2011  . Erythrocytosis 05/18/2011  . S/P TKR (total knee replacement) 12/31/2010  . Obesity, Class III, BMI 40-49.9 (morbid obesity) ([place]) 12/31/2010  . Hyperlipidemia 12/22/2010  . BPH (benign prostatic hyperplasia) 12/22/2010  . Gout 12/22/2010  . Obstructive sleep apnea 04/04/2009    Past Surgical History:  Procedure Laterality Date  . addenoids    . ANAL FISTULOTOMY  2019   Dr Drue Flirt, [facility]  . IRRIGATION AND DEBRIDEMENT ABSCESS N/A 11/10/2019   Procedure: INCISION AND DRAINAGE OF HIGH SCROTAL ABSCESS;  Surgeon: Michael Boston, MD;  Location: [facility];  Service: General;  Laterality: N/A;  . JOINT REPLACEMENT  2012   RT KNEE  . LUNG BIOPSY     20 YRS AGO  . RECTAL EXAM UNDER ANESTHESIA N/A 11/10/2019   Procedure: ANORECTAL EXAM UNDER ANESTHESIA;  Surgeon: Michael Boston, MD;  Location: [facility];  Service: General;  Laterality: N/A;  . TONSILLECTOMY    . TOTAL KNEE ARTHROPLASTY Bilateral 06/22/2013   Procedure: LEFT TOTAL KNEE ARTHROPLASTY;  MEDIAL SOFT TISSUE EXPLORATION SAPHENOUS NEURECTOMY RIGHT KNEE;  Surgeon: Mauri Pole, MD;  Location: [facility];  Service:  Orthopedics;  Laterality: Bilateral;       Family History  Problem Relation Age of Onset  . Emphysema Father   . Clotting disorder Father   . Arthritis Father   . Heart disease Mother   . Arthritis Mother   . Arthritis Maternal Grandmother   . Hypertension Maternal Grandmother   . Diabetes Maternal Grandmother   . Arthritis Paternal Grandmother   . Diabetes Maternal Aunt   . Diabetes Paternal Aunt     Social History   Tobacco Use  . Smoking status: Never Smoker  . Smokeless tobacco: Never Used  Vaping Use  . Vaping Use: Never used  Substance Use Topics  . Alcohol use: Yes    Alcohol/week: 0.0 standard drinks    Comment: Once a month  . Drug use: No    Home Medications Prior to Admission medications   Medication Sig Start Date End Date Taking? Authorizing Provider  albuterol (VENTOLIN HFA) 108 (90 Base) MCG/ACT inhaler Inhale 2 puffs into the lungs every 6 (six) hours as needed for wheezing or shortness of breath.   Yes [provider]  allopurinol (ZYLOPRIM) 300 MG tablet TAKE 1 TABLET(300 MG) BY MOUTH DAILY Patient taking differently: 300 mg daily.  01/13/18  Yes [name], MD  apixaban (ELIQUIS) 5 MG TABS tablet Take 1 tablet (5 mg total) by mouth 2 (two) times daily. 11/15/19 02/13/20 Yes Kayleen Memos, DO  aspirin 81 MG tablet Take 81 mg by mouth daily.   Yes [provider]  atorvastatin (LIPITOR) 40 MG tablet TAKE 1 TABLET(40 MG) BY MOUTH EVERY EVENING Patient taking differently: Take 40 mg by mouth daily.  07/06/19  Yes [name], MD  bisacodyl (DULCOLAX) 10 MG suppository Place 10 mg rectally as needed for moderate constipation.   Yes [provider]  bisoprolol (ZEBETA) 5 MG tablet Take 0.5 tablets (2.5 mg total) by mouth daily. 11/15/19 01/14/20 Yes Hall, Lorenda Cahill, DO  cephALEXin (KEFLEX) 500 MG capsule Take 2 capsules (1,000 mg total) by mouth 3 (three) times daily for 10 days. 11/14/19 11/24/19 Yes Michael Boston, MD    cetirizine (ZYRTEC) 10 MG tablet TAKE 1 TABLET(10 MG) BY MOUTH DAILY 02/09/19  Yes [name], MD  Cholecalciferol (VITAMIN D) 2000 UNITS tablet Take 2,000 Units by mouth daily.   Yes [provider]  Cyanocobalamin (B-12) 2000 MCG TABS Take 1 tablet by mouth daily.    Yes [provider]  donepezil (ARICEPT) 10 MG tablet Take 1 tablet (10 mg total) by mouth at bedtime. 08/10/19  Yes [name], MD  empagliflozin (JARDIANCE) 10 MG TABS tablet Take 10 mg by mouth daily before breakfast. 04/22/19  Yes [name], MD  fluticasone [facility]) 50 MCG/ACT nasal spray Place 2 sprays into both nostrils daily. 01/17/17  Yes [name], MD  folic acid (FOLVITE) 1 MG tablet Take 1 mg by mouth daily.  04/29/12  Yes  [provider]  furosemide (LASIX) 20 MG tablet TAKE 1 TABLET(20 MG) BY MOUTH DAILY 07/06/19  Yes [name], MD  magnesium hydroxide (MILK OF MAGNESIA) 400 MG/5ML suspension Take 30 mLs by mouth daily as needed for mild constipation. If no bowel movement in 3 days   Yes [provider]  meloxicam (MOBIC) 15 MG tablet Take 1 tablet by mouth daily. Patient taking differently: Take 15 mg by mouth daily. Take 1 tablet by mouth daily. 07/06/19  Yes [name], MD  metroNIDAZOLE (FLAGYL) 500 MG tablet Take 1 tablet (500 mg total) by mouth every 8 (eight) hours for 10 days. 11/14/19 11/24/19 Yes Michael Boston, MD  omeprazole (PRILOSEC) 40 MG capsule TAKE 1 CAPSULE BY MOUTH 30 TO 60 MINUTES BEFORE YOUR FIRST AND LAST MEAL OF THE DAY 09/22/19  Yes Tanda Rockers, MD  polyethylene glycol (MIRALAX / GLYCOLAX) 17 g packet Take 17 g by mouth daily as needed for mild constipation. 11/15/19  Yes Hall, Carole N, DO  potassium chloride (MICRO-K) 10 MEQ CR capsule TAKE 1 CAPSULE BY MOUTH EVERY DAY 07/06/19  Yes [name], MD  psyllium (HYDROCIL/METAMUCIL) 95 % PACK Take 1 packet by mouth 2 (two) times daily. 11/15/19 02/13/20 Yes  Hall, Carole N, DO  sertraline (ZOLOFT) 50 MG tablet TAKE 1 TABLET(50 MG) BY MOUTH DAILY. 04/17/19  Yes [name], MD  tamsulosin (FLOMAX) 0.4 MG CAPS capsule Take 1 capsule (0.4 mg total) by mouth daily after supper. 11/15/19 02/13/20 Yes Hall, Carole N, DO  tiZANidine (ZANAFLEX) 4 MG tablet Take 1 tablet (4 mg total) by mouth every 6 (six) hours as needed for muscle spasms. 07/26/16  Yes [name], MD  Albuterol Sulfate (PROAIR RESPICLICK) 630 (90 Base) MCG/ACT AEPB Inhale 2 puffs into the lungs every 6 (six) hours as needed (for wheezing or shortness of breath). Patient not taking: Reported on 11/22/2019 04/07/18   Brunetta Jeans, PA-C  fluocinonide cream (LIDEX) 1.60 % Apply 1 application topically 2 (two) times daily. To arms for psoriasis 11/20/19   Reed, Tiffany L, DO    Allergies    Penicillins, Lyrica [pregabalin], and Levofloxacin  Review of Systems   Review of Systems  All other systems reviewed and are negative.   Physical Exam Updated Vital Signs BP 115/71   Pulse 77   Temp 98.1 F (36.7 C) (Oral)   Resp 19   Ht 5\' 11"  (1.803 m)   Wt 118.4 kg   SpO2 97%   BMI 36.40 kg/m   Physical Exam Vitals and nursing note reviewed.  Constitutional:      Appearance: He is well-developed.  HENT:     Head: Normocephalic and atraumatic.     Mouth/Throat:     Mouth: Mucous membranes are moist.     Pharynx: Oropharynx is clear.  Eyes:     Pupils: Pupils are equal, round, and reactive to light.  Cardiovascular:     Rate and Rhythm: Normal rate.  Pulmonary:     Effort: Pulmonary effort is normal. No respiratory distress.  Abdominal:     General: Abdomen is flat. There is no distension.  Musculoskeletal:        General: Normal range of motion.     Cervical back: Normal range of motion.  Skin:    General: Skin is warm and dry.     Findings: Erythema (under pannus and surrounding penis) present.  Neurological:     General: No focal deficit present.  Mental Status: He is alert.     ED Results / Procedures / Treatments   Labs (all labs ordered are listed, but only abnormal results are displayed) Labs Reviewed  COMPREHENSIVE METABOLIC PANEL - Abnormal; Notable for the following components:      Result Value   Potassium 3.2 (*)    Glucose, Bld 142 (*)    BUN 7 (*)    Calcium 8.8 (*)    Total Protein 5.9 (*)    Albumin 2.9 (*)    All other components within normal limits  CBC WITH DIFFERENTIAL/PLATELET - Abnormal; Notable for the following components:   RDW 17.0 (*)    All other components within normal limits  LACTIC ACID, PLASMA - Abnormal; Notable for the following components:   Lactic Acid, Venous 2.2 (*)    All other components within normal limits  PROTIME-INR - Abnormal; Notable for the following components:   Prothrombin Time 16.0 (*)    INR 1.3 (*)    All other components within normal limits  GASTROINTESTINAL PANEL BY PCR, STOOL (REPLACES STOOL CULTURE)  C DIFFICILE QUICK SCREEN W PCR REFLEX  LACTIC ACID, PLASMA  HEMOGLOBIN  OCCULT BLOOD X 1 CARD TO LAB, STOOL  HEMOGLOBIN  HEMOGLOBIN  HEMOGLOBIN  TYPE AND SCREEN    EKG EKG Interpretation  Date/Time:  Saturday November 21 2019 22:48:53 EDT Ventricular Rate:  61 PR Interval:    QRS Duration: 95 QT Interval:  413 QTC Calculation: 416 R Axis:   -20 Text Interpretation: Sinus rhythm Short PR interval Inferior infarct, old since last tracing no significant change Confirmed by Daleen Bo [phone]) on 11/21/2019 11:18:25 PM   Radiology CT ABDOMEN PELVIS W CONTRAST  Result Date: 11/22/2019 CLINICAL DATA:  Chronic anticoagulation, rectal bleeding EXAM: CT ABDOMEN AND PELVIS WITH CONTRAST TECHNIQUE: Multidetector CT imaging of the abdomen and pelvis was performed using the standard protocol following bolus administration of intravenous contrast. CONTRAST:  177mL OMNIPAQUE IOHEXOL 300 MG/ML  SOLN COMPARISON:  11/09/2019 FINDINGS: Lower chest: Tiny bilateral  pleural effusions are present with associated mild bibasilar compressive atelectasis. Extensive calcification of the aortic valve leaflets. Global cardiac size within normal limits. Hepatobiliary: Cholelithiasis without pericholecystic inflammatory change. Mild hepatic steatosis. No intra or extrahepatic biliary ductal dilation. Pancreas: Unremarkable Spleen: Unremarkable Adrenals/Urinary Tract: Moderate bilateral renal cortical atrophy. Multiple simple cortical cysts are seen within the kidneys bilaterally. No hydronephrosis. No intrarenal or ureteral calculi. The adrenal glands are unremarkable. The bladder is decompressed with a Foley catheter balloon seen within its lumen. Stomach/Bowel: Stomach, small bowel, and large bowel are unremarkable save for a few scattered descending and sigmoid colonic diverticula. The appendix is normal. No free intraperitoneal gas or fluid. Vascular/Lymphatic: Mild aortoiliac atherosclerotic calcification. No aortic aneurysm. The abdominal vasculature is otherwise unremarkable. No pathologic adenopathy within the abdomen and pelvis. Reproductive: Prostate is unremarkable. Other: Extensive subcutaneous gas noted within the left perineum and gluteal folds has improved in the interval since the prior examination. Mild residual inflammatory subcutaneous infiltration is seen within this region, best appreciated on axial image # 102/2. No discrete drainable fluid collection. Rectum unremarkable. Musculoskeletal: No lytic or blastic bone lesion is seen. IMPRESSION: No definite source identified on this non arteriographic study for the patient's reported gastrointestinal hemorrhage. Improved subcutaneous gas within the perineum and left gluteal crease. Interval Foley decompression of the bladder. Additional incidental findings as noted above. Aortic Atherosclerosis (ICD10-I70.0). Electronically Signed   By: Fidela Salisbury MD   On: 11/22/2019 01:16  Procedures Procedures (including  critical care time)  Medications Ordered in ED Medications  sodium chloride (PF) 0.9 % injection (  Not Given 11/22/19 0015)  allopurinol (ZYLOPRIM) tablet 300 mg (has no administration in time range)  cephALEXin (KEFLEX) capsule 1,000 mg (has no administration in time range)  metroNIDAZOLE (FLAGYL) tablet 500 mg (500 mg Oral Given 11/22/19 0525)  atorvastatin (LIPITOR) tablet 40 mg (has no administration in time range)  bisoprolol (ZEBETA) tablet 2.5 mg (has no administration in time range)  furosemide (LASIX) tablet 20 mg (has no administration in time range)  donepezil (ARICEPT) tablet 10 mg (has no administration in time range)  sertraline (ZOLOFT) tablet 50 mg (has no administration in time range)  empagliflozin (JARDIANCE) tablet 10 mg (has no administration in time range)  pantoprazole (PROTONIX) EC tablet 80 mg (has no administration in time range)  tamsulosin (FLOMAX) capsule 0.4 mg (has no administration in time range)  tiZANidine (ZANAFLEX) tablet 4 mg (has no administration in time range)  cholecalciferol (VITAMIN D) tablet 2,000 Units (has no administration in time range)  potassium chloride (KLOR-CON) CR tablet 10 mEq (has no administration in time range)  albuterol (VENTOLIN HFA) 108 (90 Base) MCG/ACT inhaler 2 puff (has no administration in time range)  loratadine (CLARITIN) tablet 10 mg (has no administration in time range)  fluticasone (FLONASE) 50 MCG/ACT nasal spray 2 spray (has no administration in time range)  0.9 %  sodium chloride infusion ( Intravenous New Bag/Given 11/22/19 0522)  acetaminophen (TYLENOL) tablet 650 mg (has no administration in time range)    Or  acetaminophen (TYLENOL) suppository 650 mg (has no administration in time range)  insulin aspart (novoLOG) injection 0-20 Units (has no administration in time range)  lactated ringers bolus 1,000 mL (0 mLs Intravenous Stopped 11/22/19 0336)  iohexol (OMNIPAQUE) 300 MG/ML solution 100 mL (100 mLs  Intravenous Contrast Given 11/22/19 0029)    ED Course  I have reviewed the triage vital signs and the nursing notes.  Pertinent labs & imaging results that were available during my care of the patient were reviewed by me and considered in my medical decision making (see chart for details).    MDM Rules/Calculators/A&P                          We will get labs and CT scans since he is on Eliquis.  He is hemodynamically stable and no concerning findings in his history.  Disposition pending labs and CT. Had another large clots and some bright red blood.  Evaluation of his surgical wound appears to be okay.  CT scan is unremarkable.  This was being on his Eliquis I discussed with the hospitalist for admission.  Urgent consult placed for GI to evaluate the morning.   Final Clinical Impression(s) / ED Diagnoses Final diagnoses:  Acute GI bleeding    Rx / DC Orders ED Discharge Orders    None       Sentoria Brent, Corene Cornea, MD 11/22/19 [phone]

## 2019-11-21 NOTE — ED Triage Notes (Signed)
Pt came in via EMS from [facility] with c/o rectal bleeding. Pt is on Eliquis. He does have melena and bleeding out of his catheter. Pt's pressures are stable at 120s/60s. Pt had rectal abscess surgery on 11/18/2019

## 2019-11-22 ENCOUNTER — Encounter (HOSPITAL_COMMUNITY): Payer: Self-pay

## 2019-11-22 ENCOUNTER — Emergency Department (HOSPITAL_COMMUNITY): Payer: Medicare PPO

## 2019-11-22 ENCOUNTER — Inpatient Hospital Stay (HOSPITAL_COMMUNITY): Payer: Medicare PPO

## 2019-11-22 DIAGNOSIS — Z825 Family history of asthma and other chronic lower respiratory diseases: Secondary | ICD-10-CM | POA: Diagnosis not present

## 2019-11-22 DIAGNOSIS — I480 Paroxysmal atrial fibrillation: Secondary | ICD-10-CM | POA: Diagnosis present

## 2019-11-22 DIAGNOSIS — K922 Gastrointestinal hemorrhage, unspecified: Secondary | ICD-10-CM | POA: Diagnosis present

## 2019-11-22 DIAGNOSIS — G4733 Obstructive sleep apnea (adult) (pediatric): Secondary | ICD-10-CM | POA: Diagnosis present

## 2019-11-22 DIAGNOSIS — Z832 Family history of diseases of the blood and blood-forming organs and certain disorders involving the immune mechanism: Secondary | ICD-10-CM | POA: Diagnosis not present

## 2019-11-22 DIAGNOSIS — R338 Other retention of urine: Secondary | ICD-10-CM

## 2019-11-22 DIAGNOSIS — K921 Melena: Secondary | ICD-10-CM

## 2019-11-22 DIAGNOSIS — Z20822 Contact with and (suspected) exposure to covid-19: Secondary | ICD-10-CM | POA: Diagnosis present

## 2019-11-22 DIAGNOSIS — E1165 Type 2 diabetes mellitus with hyperglycemia: Secondary | ICD-10-CM

## 2019-11-22 DIAGNOSIS — K612 Anorectal abscess: Secondary | ICD-10-CM | POA: Diagnosis present

## 2019-11-22 DIAGNOSIS — Z8249 Family history of ischemic heart disease and other diseases of the circulatory system: Secondary | ICD-10-CM | POA: Diagnosis not present

## 2019-11-22 DIAGNOSIS — R339 Retention of urine, unspecified: Secondary | ICD-10-CM | POA: Diagnosis present

## 2019-11-22 DIAGNOSIS — I5032 Chronic diastolic (congestive) heart failure: Secondary | ICD-10-CM

## 2019-11-22 DIAGNOSIS — L4050 Arthropathic psoriasis, unspecified: Secondary | ICD-10-CM | POA: Diagnosis present

## 2019-11-22 DIAGNOSIS — F0390 Unspecified dementia without behavioral disturbance: Secondary | ICD-10-CM | POA: Diagnosis present

## 2019-11-22 DIAGNOSIS — Z8261 Family history of arthritis: Secondary | ICD-10-CM | POA: Diagnosis not present

## 2019-11-22 DIAGNOSIS — Z7901 Long term (current) use of anticoagulants: Secondary | ICD-10-CM | POA: Diagnosis not present

## 2019-11-22 DIAGNOSIS — Z7984 Long term (current) use of oral hypoglycemic drugs: Secondary | ICD-10-CM | POA: Diagnosis not present

## 2019-11-22 DIAGNOSIS — I110 Hypertensive heart disease with heart failure: Secondary | ICD-10-CM | POA: Diagnosis present

## 2019-11-22 DIAGNOSIS — I10 Essential (primary) hypertension: Secondary | ICD-10-CM

## 2019-11-22 DIAGNOSIS — N401 Enlarged prostate with lower urinary tract symptoms: Secondary | ICD-10-CM | POA: Diagnosis present

## 2019-11-22 DIAGNOSIS — M109 Gout, unspecified: Secondary | ICD-10-CM | POA: Diagnosis present

## 2019-11-22 DIAGNOSIS — Z6841 Body Mass Index (BMI) 40.0 and over, adult: Secondary | ICD-10-CM | POA: Diagnosis not present

## 2019-11-22 DIAGNOSIS — L7622 Postprocedural hemorrhage and hematoma of skin and subcutaneous tissue following other procedure: Secondary | ICD-10-CM | POA: Diagnosis present

## 2019-11-22 DIAGNOSIS — K5790 Diverticulosis of intestine, part unspecified, without perforation or abscess without bleeding: Secondary | ICD-10-CM

## 2019-11-22 DIAGNOSIS — K219 Gastro-esophageal reflux disease without esophagitis: Secondary | ICD-10-CM | POA: Diagnosis present

## 2019-11-22 LAB — HEMOGLOBIN
Hemoglobin: 13.4 g/dL (ref 13.0–17.0)
Hemoglobin: 13.2 g/dL (ref 13.0–17.0)
Hemoglobin: 12.9 g/dL — ABNORMAL LOW (ref 13.0–17.0)

## 2019-11-22 LAB — LACTIC ACID, PLASMA
Lactic Acid, Venous: 1.3 mmol/L (ref 0.5–1.9)
Lactic Acid, Venous: 2.2 mmol/L (ref 0.5–1.9)

## 2019-11-22 LAB — COMPREHENSIVE METABOLIC PANEL
ALT: 12 U/L (ref 0–44)
AST: 20 U/L (ref 15–41)
Albumin: 2.6 g/dL — ABNORMAL LOW (ref 3.5–5.0)
Alkaline Phosphatase: 53 U/L (ref 38–126)
Anion gap: 9 (ref 5–15)
BUN: 6 mg/dL — ABNORMAL LOW (ref 8–23)
CO2: 23 mmol/L (ref 22–32)
Calcium: 8.2 mg/dL — ABNORMAL LOW (ref 8.9–10.3)
Chloride: 106 mmol/L (ref 98–111)
Creatinine, Ser: 0.9 mg/dL (ref 0.61–1.24)
GFR, Estimated: >60 mL/min (ref >60)
Glucose, Bld: 145 mg/dL — ABNORMAL HIGH (ref 70–99)
Potassium: 3.2 mmol/L — ABNORMAL LOW (ref 3.5–5.1)
Sodium: 138 mmol/L (ref 135–145)
Total Bilirubin: 0.9 mg/dL (ref 0.3–1.2)
Total Protein: 5.3 g/dL — ABNORMAL LOW (ref 6.5–8.1)

## 2019-11-22 LAB — PROTIME-INR
INR: 1.3 — ABNORMAL HIGH (ref 0.8–1.2)
Prothrombin Time: 16 seconds — ABNORMAL HIGH (ref 11.4–15.2)

## 2019-11-22 LAB — GLUCOSE, CAPILLARY: Glucose-Capillary: 114 mg/dL — ABNORMAL HIGH (ref 70–99)

## 2019-11-22 LAB — TYPE AND SCREEN
ABO/RH(D): O POS
Antibody Screen: NEGATIVE

## 2019-11-22 LAB — CBG MONITORING, ED
Glucose-Capillary: 135 mg/dL — ABNORMAL HIGH (ref 70–99)
Glucose-Capillary: 131 mg/dL — ABNORMAL HIGH (ref 70–99)
Glucose-Capillary: 119 mg/dL — ABNORMAL HIGH (ref 70–99)

## 2019-11-22 MED ORDER — CEPHALEXIN 500 MG PO CAPS
1000.0000 mg | ORAL_CAPSULE | Freq: Three times a day (TID) | ORAL | Status: DC
  Administered 2019-11-22: 1000 mg via ORAL
  Filled 2019-11-22: qty 2

## 2019-11-22 MED ORDER — IOHEXOL 300 MG/ML  SOLN
100.0000 mL | Freq: Once | INTRAMUSCULAR | Status: AC | PRN
  Administered 2019-11-22: 100 mL via INTRAVENOUS

## 2019-11-22 MED ORDER — FLUTICASONE PROPIONATE 50 MCG/ACT NA SUSP
2.0000 | Freq: Every day | NASAL | Status: DC
  Administered 2019-11-22 – 2019-11-25 (×4): 2 via NASAL
  Filled 2019-11-22 (×2): qty 16

## 2019-11-22 MED ORDER — INSULIN ASPART 100 UNIT/ML ~~LOC~~ SOLN
0.0000 [IU] | Freq: Three times a day (TID) | SUBCUTANEOUS | Status: DC
  Administered 2019-11-22 – 2019-11-24 (×4): 3 [IU] via SUBCUTANEOUS
  Filled 2019-11-22: qty 0.2

## 2019-11-22 MED ORDER — PANTOPRAZOLE SODIUM 40 MG PO TBEC
80.0000 mg | DELAYED_RELEASE_TABLET | Freq: Every day | ORAL | Status: DC
  Administered 2019-11-22 – 2019-11-25 (×4): 80 mg via ORAL
  Filled 2019-11-22 (×4): qty 2

## 2019-11-22 MED ORDER — TAMSULOSIN HCL 0.4 MG PO CAPS
0.4000 mg | ORAL_CAPSULE | Freq: Every day | ORAL | Status: DC
  Administered 2019-11-22 – 2019-11-25 (×4): 0.4 mg via ORAL
  Filled 2019-11-22 (×4): qty 1

## 2019-11-22 MED ORDER — ACETAMINOPHEN 325 MG PO TABS
650.0000 mg | ORAL_TABLET | Freq: Four times a day (QID) | ORAL | Status: DC | PRN
  Administered 2019-11-22 – 2019-11-24 (×2): 650 mg via ORAL
  Filled 2019-11-22 (×2): qty 2

## 2019-11-22 MED ORDER — EMPAGLIFLOZIN 10 MG PO TABS
10.0000 mg | ORAL_TABLET | Freq: Every day | ORAL | Status: DC
  Administered 2019-11-22 – 2019-11-25 (×4): 10 mg via ORAL
  Filled 2019-11-22 (×4): qty 1

## 2019-11-22 MED ORDER — ACETAMINOPHEN 650 MG RE SUPP: 650.0000 mg | Freq: Four times a day (QID) | RECTAL | Status: DC | PRN

## 2019-11-22 MED ORDER — ATORVASTATIN CALCIUM 40 MG PO TABS
40.0000 mg | ORAL_TABLET | Freq: Every day | ORAL | Status: DC
  Administered 2019-11-22 – 2019-11-25 (×4): 40 mg via ORAL
  Filled 2019-11-22 (×4): qty 1

## 2019-11-22 MED ORDER — VITAMIN D3 25 MCG (1000 UNIT) PO TABS
2000.0000 [IU] | ORAL_TABLET | Freq: Every day | ORAL | Status: DC
  Administered 2019-11-22 – 2019-11-25 (×4): 2000 [IU] via ORAL
  Filled 2019-11-22 (×4): qty 2

## 2019-11-22 MED ORDER — SODIUM CHLORIDE (PF) 0.9 % IJ SOLN
INTRAMUSCULAR | Status: AC
  Filled 2019-11-22: qty 50

## 2019-11-22 MED ORDER — SERTRALINE HCL 50 MG PO TABS
50.0000 mg | ORAL_TABLET | Freq: Every day | ORAL | Status: DC
  Administered 2019-11-22 – 2019-11-25 (×4): 50 mg via ORAL
  Filled 2019-11-22 (×4): qty 1

## 2019-11-22 MED ORDER — POTASSIUM CHLORIDE CRYS ER 10 MEQ PO TBCR
10.0000 meq | EXTENDED_RELEASE_TABLET | Freq: Every day | ORAL | Status: DC
  Administered 2019-11-22 – 2019-11-25 (×4): 10 meq via ORAL
  Filled 2019-11-22 (×4): qty 1

## 2019-11-22 MED ORDER — CHLORHEXIDINE GLUCONATE CLOTH 2 % EX PADS
6.0000 | MEDICATED_PAD | Freq: Every day | CUTANEOUS | Status: DC
  Administered 2019-11-23 – 2019-11-24 (×2): 6 via TOPICAL

## 2019-11-22 MED ORDER — FUROSEMIDE 40 MG PO TABS
20.0000 mg | ORAL_TABLET | Freq: Every day | ORAL | Status: DC
  Administered 2019-11-22: 20 mg via ORAL
  Filled 2019-11-22: qty 1

## 2019-11-22 MED ORDER — TIZANIDINE HCL 4 MG PO TABS
4.0000 mg | ORAL_TABLET | Freq: Four times a day (QID) | ORAL | Status: DC | PRN
  Administered 2019-11-22: 4 mg via ORAL
  Filled 2019-11-22: qty 1

## 2019-11-22 MED ORDER — BISOPROLOL FUMARATE 5 MG PO TABS
2.5000 mg | ORAL_TABLET | Freq: Every day | ORAL | Status: DC
  Administered 2019-11-22 – 2019-11-25 (×4): 2.5 mg via ORAL
  Filled 2019-11-22 (×3): qty 1
  Filled 2019-11-22: qty 0.5

## 2019-11-22 MED ORDER — DONEPEZIL HCL 10 MG PO TABS
10.0000 mg | ORAL_TABLET | Freq: Every day | ORAL | Status: DC
  Administered 2019-11-22 – 2019-11-24 (×3): 10 mg via ORAL
  Filled 2019-11-22 (×3): qty 1

## 2019-11-22 MED ORDER — SODIUM CHLORIDE 0.9 % IV SOLN: INTRAVENOUS | Status: DC

## 2019-11-22 MED ORDER — METRONIDAZOLE 500 MG PO TABS
500.0000 mg | ORAL_TABLET | Freq: Three times a day (TID) | ORAL | Status: DC
  Administered 2019-11-22 – 2019-11-25 (×10): 500 mg via ORAL
  Filled 2019-11-22 (×10): qty 1

## 2019-11-22 MED ORDER — ALLOPURINOL 300 MG PO TABS
300.0000 mg | ORAL_TABLET | Freq: Every day | ORAL | Status: DC
  Administered 2019-11-22 – 2019-11-25 (×4): 300 mg via ORAL
  Filled 2019-11-22 (×4): qty 1

## 2019-11-22 MED ORDER — ALBUTEROL SULFATE HFA 108 (90 BASE) MCG/ACT IN AERS: 2.0000 | INHALATION_SPRAY | Freq: Four times a day (QID) | RESPIRATORY_TRACT | Status: DC | PRN

## 2019-11-22 MED ORDER — SODIUM CHLORIDE (PF) 0.9 % IJ SOLN
INTRAMUSCULAR | Status: AC
  Administered 2019-11-22: 10 mL
  Filled 2019-11-22: qty 50

## 2019-11-22 MED ORDER — LORATADINE 10 MG PO TABS
10.0000 mg | ORAL_TABLET | Freq: Every day | ORAL | Status: DC
  Administered 2019-11-22 – 2019-11-25 (×4): 10 mg via ORAL
  Filled 2019-11-22 (×4): qty 1

## 2019-11-22 MED ORDER — IOHEXOL 350 MG/ML SOLN
100.0000 mL | Freq: Once | INTRAVENOUS | Status: AC | PRN
  Administered 2019-11-22: 100 mL via INTRAVENOUS

## 2019-11-22 NOTE — H&P (Signed)
History and Physical    Gary Butler BJY:[id number] DOB: 11/15/1945 DOA: 11/21/2019  PCP: [name], MD (Confirm with patient/family/NH records and if not entered, this has to be entered at [facility] point of entry) Patient coming from: SNF  I have personally briefly reviewed patient's old medical records in [place]  Chief Complaint: hematochezia  HPI: Gary Butler is a 74 y.o. male with medical history significant of DM, OSA, obesity, HTN with a recent hospitalization 9/27-10/5/21 for perirectal abscess requiring surgical I&D. He had urinary retention during that stay and failed voiding trail and was d/c'd with foley in place. He had new onset a. Fib during this hospitalization and due CHADsVasc score of 3 was started on Eliquis.  October 9th he had an episode of bright red blood and clots per rectum for which he presented [facility] the [facility]. He had a second witnessed episode of hematochezia with clot while in the ED.  ED Course: T 98.1  121/64  HR 66  RR 22 EDP exam included exam of recent perineal operative site which by report was intact w/o evidence of bleeding or drainage.  Lab revealed Hgb 15, serum glucose of 142. Lactic acid #1 1.3, #2 2.2. He was covid negative on 11/17/19. CT scan abdomen/pelvis revealed decreased emphysema in the perineal tissue but not source of bleeding was identified. TRH called to admit patient for monitoring of LGI bleed.   Review of Systems: As per HPI otherwise 10 point review of systems negative.    Past Medical History:  Diagnosis Date  . Arthritis   . Constipation   . Diabetes mellitus   . Heart murmur   . History of gout   . History of skin cancer   . Hypertension   . Morbid (severe) obesity due to excess calories ([place])   . OSA (obstructive sleep apnea)   . Psoriatic arthritis ([place])   . Rectal fissure   . Sleep apnea   . Tinnitus     Past Surgical History:  Procedure Laterality Date  . addenoids    . ANAL FISTULOTOMY  2019   Dr Drue Flirt,  [facility]  . IRRIGATION AND DEBRIDEMENT ABSCESS N/A 11/10/2019   Procedure: INCISION AND DRAINAGE OF HIGH SCROTAL ABSCESS;  Surgeon:  Boston, MD;  Location: [facility];  Service: General;  Laterality: N/A;  . JOINT REPLACEMENT  2012   RT KNEE  . LUNG BIOPSY     20 YRS AGO  . RECTAL EXAM UNDER ANESTHESIA N/A 11/10/2019   Procedure: ANORECTAL EXAM UNDER ANESTHESIA;  Surgeon:  Boston, MD;  Location: [facility];  Service: General;  Laterality: N/A;  . TONSILLECTOMY    . TOTAL KNEE ARTHROPLASTY Bilateral 06/22/2013   Procedure: LEFT TOTAL KNEE ARTHROPLASTY;  MEDIAL SOFT TISSUE EXPLORATION SAPHENOUS NEURECTOMY RIGHT KNEE;  Surgeon: Mauri Pole, MD;  Location: [facility];  Service: Orthopedics;  Laterality: Bilateral;    Soc Hx - married 81 years. He has two sons, one adopted. 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 He is active in Furniture conservator/restorer.   reports that he has never smoked. He has never used smokeless tobacco. He reports current alcohol use. He reports that he does not use drugs.  Allergies  Allergen Reactions  . Penicillins Hives    Patient reports full body hives that required medical treatment when he was in his 69s or 37s. He tolerated cephalosporins.   [name] [Pregabalin] Swelling  . Levofloxacin Swelling  Family History  Problem Relation Age of Onset  . Emphysema Father   . Clotting disorder Father   . Arthritis Father   . Heart disease Mother   . Arthritis Mother   . Arthritis Maternal Grandmother   . Hypertension Maternal Grandmother   . Diabetes Maternal Grandmother   . Arthritis Paternal Grandmother   . Diabetes Maternal Aunt   . Diabetes Paternal Aunt      Prior to Admission medications   Medication Sig Start Date End Date Taking? Authorizing Provider  albuterol (VENTOLIN HFA) 108 (90 Base) MCG/ACT inhaler Inhale 2 puffs into the lungs every 6 (six) hours as needed for wheezing or shortness of breath.   Yes  [provider]  allopurinol (ZYLOPRIM) 300 MG tablet TAKE 1 TABLET(300 MG) BY MOUTH DAILY Patient taking differently: 300 mg daily.  01/13/18  Yes [name], MD  apixaban (ELIQUIS) 5 MG TABS tablet Take 1 tablet (5 mg total) by mouth 2 (two) times daily. 11/15/19 02/13/20 Yes Kayleen Memos, DO  aspirin 81 MG tablet Take 81 mg by mouth daily.   Yes [provider]  atorvastatin (LIPITOR) 40 MG tablet TAKE 1 TABLET(40 MG) BY MOUTH EVERY EVENING Patient taking differently: Take 40 mg by mouth daily.  07/06/19  Yes [name], MD  bisacodyl (DULCOLAX) 10 MG suppository Place 10 mg rectally as needed for moderate constipation.   Yes [provider]  bisoprolol (ZEBETA) 5 MG tablet Take 0.5 tablets (2.5 mg total) by mouth daily. 11/15/19 01/14/20 Yes Hall, Lorenda Cahill, DO  cephALEXin (KEFLEX) 500 MG capsule Take 2 capsules (1,000 mg total) by mouth 3 (three) times daily for 10 days. 11/14/19 11/24/19 Yes Cailyn Houdek Boston, MD  cetirizine (ZYRTEC) 10 MG tablet TAKE 1 TABLET(10 MG) BY MOUTH DAILY 02/09/19  Yes [name], MD  Cholecalciferol (VITAMIN D) 2000 UNITS tablet Take 2,000 Units by mouth daily.   Yes [provider]  Cyanocobalamin (B-12) 2000 MCG TABS Take 1 tablet by mouth daily.    Yes [provider]  donepezil (ARICEPT) 10 MG tablet Take 1 tablet (10 mg total) by mouth at bedtime. 08/10/19  Yes [name], MD  empagliflozin (JARDIANCE) 10 MG TABS tablet Take 10 mg by mouth daily before breakfast. 04/22/19  Yes [name], MD  fluticasone [facility] Novamed) 50 MCG/ACT nasal spray Place 2 sprays into both nostrils daily. 01/17/17  Yes [name], MD  folic acid (FOLVITE) 1 MG tablet Take 1 mg by mouth daily.  04/29/12  Yes [provider]  furosemide (LASIX) 20 MG tablet TAKE 1 TABLET(20 MG) BY MOUTH DAILY 07/06/19  Yes [name], MD  magnesium hydroxide (MILK OF MAGNESIA) 400 MG/5ML suspension Take 30  mLs by mouth daily as needed for mild constipation. If no bowel movement in 3 days   Yes [provider]  meloxicam (MOBIC) 15 MG tablet Take 1 tablet by mouth daily. Patient taking differently: Take 15 mg by mouth daily. Take 1 tablet by mouth daily. 07/06/19  Yes [name], MD  metroNIDAZOLE (FLAGYL) 500 MG tablet Take 1 tablet (500 mg total) by mouth every 8 (eight) hours for 10 days. 11/14/19 11/24/19 Yes Atiana Levier Boston, MD  omeprazole (PRILOSEC) 40 MG capsule TAKE 1 CAPSULE BY MOUTH 30 TO 60 MINUTES BEFORE YOUR FIRST AND LAST MEAL OF THE DAY 09/22/19  Yes Tanda Rockers, MD  polyethylene glycol (MIRALAX / GLYCOLAX) 17 g packet Take 17 g by mouth daily  as needed for mild constipation. 11/15/19  Yes Hall, Carole N, DO  potassium chloride (MICRO-K) 10 MEQ CR capsule TAKE 1 CAPSULE BY MOUTH EVERY DAY 07/06/19  Yes [name], MD  psyllium (HYDROCIL/METAMUCIL) 95 % PACK Take 1 packet by mouth 2 (two) times daily. 11/15/19 02/13/20 Yes Hall, Carole N, DO  sertraline (ZOLOFT) 50 MG tablet TAKE 1 TABLET(50 MG) BY MOUTH DAILY. 04/17/19  Yes [name], MD  tamsulosin (FLOMAX) 0.4 MG CAPS capsule Take 1 capsule (0.4 mg total) by mouth daily after supper. 11/15/19 02/13/20 Yes Hall, Carole N, DO  tiZANidine (ZANAFLEX) 4 MG tablet Take 1 tablet (4 mg total) by mouth every 6 (six) hours as needed for muscle spasms. 07/26/16  Yes [name], MD  Albuterol Sulfate (PROAIR RESPICLICK) 725 (90 Base) MCG/ACT AEPB Inhale 2 puffs into the lungs every 6 (six) hours as needed (for wheezing or shortness of breath). Patient not taking: Reported on 11/22/2019 04/07/18   Brunetta Jeans, PA-C  fluocinonide cream (LIDEX) 3.66 % Apply 1 application topically 2 (two) times daily. 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 Posterior pharynx clear of any exudate or lesions.Normal dentition.  Neck: normal, supple, no masses, no thyromegaly Respiratory: clear to auscultation bilaterally, no wheezing, no crackles. Normal respiratory effort. No accessory muscle use.  Cardiovascular: Regular rate and rhythm, no murmurs / rubs / gallops. No extremity edema. 2+ pedal pulses. No carotid bruits.  Abdomen: massively obese-girth hinders exam. BS+ x 4, no guarding or rebound. Non-tender.  Musculoskeletal: no clubbing / cyanosis. No joint deformity upper and lower extremities. Good ROM, no contractures. Normal muscle tone.  Skin:  Venous stasis rash on distal LE R>L, no lesions, ulcers. No induration Neurologic: CN 2-12 grossly intact but HOH.  Strength 5/5 in all 4.  Psychiatric: Normal judgment and insight. Alert and oriented x 3. Normal mood.     Labs on Admission: I have personally reviewed following labs and imaging studies  CBC: Recent Labs  Lab 11/21/19 2255  WBC 7.3  NEUTROABS 5.4  HGB 15.0  HCT 45.2  MCV 94.2  PLT 440   Basic Metabolic Panel: Recent Labs  Lab 11/21/19 2255  NA 139  K 3.2*  CL 103  CO2 24  GLUCOSE 142*  BUN 7*  CREATININE 0.98  CALCIUM 8.8*   GFR: Estimated Creatinine Clearance: 85.2 mL/min (by C-G formula based on SCr of 0.98 mg/dL). Liver Function Tests: Recent Labs  Lab 11/21/19 2255  AST 22  ALT 13  ALKPHOS 66  BILITOT 1.2  PROT 5.9*  ALBUMIN 2.9*   No  results for input(s): LIPASE, AMYLASE in the last 168 hours. No results for input(s): AMMONIA in the last 168 hours. Coagulation Profile: Recent Labs  Lab 11/22/19 0135  INR 1.3*   Cardiac Enzymes: No results for input(s): CKTOTAL, CKMB, CKMBINDEX, TROPONINI in the last 168 hours. BNP (last 3 results) No results for input(s): PROBNP in the last 8760 hours. HbA1C: No results for input(s): HGBA1C in the last 72 hours. CBG: Recent Labs  Lab 11/16/19 1156 11/16/19 1628 11/16/19 1920 11/17/19 0816 11/17/19 1244  GLUCAP 164* 124* 188* 127* 149*   Lipid Profile: No results for input(s): CHOL, HDL, LDLCALC, TRIG, CHOLHDL, LDLDIRECT in the last 72 hours. Thyroid Function Tests: No results for input(s): TSH, T4TOTAL, FREET4, T3FREE, THYROIDAB in the last 72 hours. Anemia Panel: No results for input(s): VITAMINB12, FOLATE, FERRITIN, TIBC, IRON, RETICCTPCT in the last 72 hours. Urine analysis:    Component Value Date/Time   COLORURINE YELLOW 11/09/2019 1937   APPEARANCEUR CLEAR 11/09/2019 1937   LABSPEC <1.005 (L) 11/09/2019 1937   PHURINE 5.5 11/09/2019 1937   GLUCOSEU >=500 (A) 11/09/2019 1937   HGBUR NEGATIVE 11/09/2019 1937   BILIRUBINUR NEGATIVE 11/09/2019 1937   KETONESUR NEGATIVE 11/09/2019 1937   PROTEINUR NEGATIVE 11/09/2019 1937   UROBILINOGEN 1.0 06/10/2013 1000   NITRITE NEGATIVE 11/09/2019 1937   LEUKOCYTESUR NEGATIVE 11/09/2019 1937    Radiological Exams on Admission: CT ABDOMEN PELVIS W CONTRAST  Result Date: 11/22/2019 CLINICAL DATA:  Chronic anticoagulation, rectal bleeding EXAM: CT ABDOMEN AND PELVIS WITH CONTRAST TECHNIQUE: Multidetector CT imaging of the abdomen and pelvis was performed using the standard protocol following bolus administration of intravenous contrast. CONTRAST:  168mL OMNIPAQUE IOHEXOL 300 MG/ML  SOLN COMPARISON:  11/09/2019 FINDINGS: Lower chest: Tiny bilateral pleural effusions are present with associated mild bibasilar compressive  atelectasis. Extensive calcification of the aortic valve leaflets. Global cardiac size within normal limits. Hepatobiliary: Cholelithiasis without pericholecystic inflammatory change. Mild hepatic steatosis. No intra or extrahepatic biliary ductal dilation. Pancreas: Unremarkable Spleen: Unremarkable Adrenals/Urinary Tract: Moderate bilateral renal cortical atrophy. Multiple simple cortical cysts are seen within the kidneys bilaterally. No hydronephrosis. No intrarenal or ureteral calculi. The adrenal glands are unremarkable. The bladder is decompressed with a Foley catheter balloon seen within its lumen. Stomach/Bowel: Stomach, small bowel, and large bowel are unremarkable save for a few scattered descending and sigmoid colonic diverticula. The appendix is normal. No free intraperitoneal gas or fluid. Vascular/Lymphatic: Mild aortoiliac atherosclerotic calcification. No aortic aneurysm. The abdominal vasculature is otherwise unremarkable. No pathologic adenopathy within the abdomen and pelvis. Reproductive: Prostate is unremarkable. Other: Extensive subcutaneous gas noted within the left perineum and gluteal folds has improved in the interval since the prior examination. Mild residual inflammatory subcutaneous infiltration is seen within this region, best appreciated on axial image # 102/2. No discrete drainable fluid collection. Rectum unremarkable. Musculoskeletal: No lytic or blastic bone lesion is seen. IMPRESSION: No definite source identified on this non arteriographic study for the patient's reported gastrointestinal hemorrhage. Improved subcutaneous gas within the perineum and left gluteal crease. Interval Foley decompression of the bladder. Additional incidental findings as noted above. Aortic Atherosclerosis (ICD10-I70.0). Electronically Signed   By: Fidela Salisbury MD   On: 11/22/2019 01:16    EKG: Independently reviewed. NSR, old inferior injury, no acute injury, no STEMI  Assessment/Plan Active  Problems:   Hematochezia   Essential hypertension   CHF (congestive heart failure) ([place])  Poorly controlled diabetes mellitus (HCC)   Atrial fibrillation (H[facility])   Obstructive sleep apnea   BPH (benign prostatic hyperplasia)   Lower GI bleed    1. Hematochezia - one episode at home, one in ED. This is a new problem. He reports having had recent colonoscopy at [facility] that was unremarkable. His episodes of hematochezia have been painless and of sudden on-set. Symptoms are suggestive of a diverticular bleed. Plan Admit to tele unit due to recent h/o a. Fib and stress of GI bleed  Monitor Hgb q 6 hours  NS low flow IVF  Stop Eliquis - opted not to reverse as risk of thrombosis exceeds benefit in setting                 Of isolated, non-life threatening bleed.  2. HFpEF - last echo 9/'21 with EF 60-65% and grade II diastolic dysfxn. No evidence of decompensation or fluid overload. Plan  Continue home meds, including daily lasix.   3. DM - last A1C Sept 13th 9.1% Plan Continue home regimen  Sliding scale coverage  Full liquid diet.   4. A. Fib - currently in sinus rhythm. This was new onset a. Fib in setting of acute illness. Plan Hold Eliquis  Tele monitoring  5. HTN- stable BP. Will continue home meds  6. Memory deficits - will continue aricept  6. BPH - patient with BPH and recent urinary retention. A foley catheter was required. He failed voiding trial. Plan continue Foley catheter  7. OSA- [place] based on body habitus Plan Continue nocturnal BiPAP - respiratory consult placed.  DVT prophylaxis: SCDs  Code Status: full code  Family Communication: patient asked that his wife not be disturbed at this early hour.  Disposition Plan: return to SNF when medically stable - [place]  Consults called: GI - Dr. Loletha Carrow has been notified and will see patient in consultation Admission status: inpatient/tele    Adella Hare MD [facility] Pager [phone]  If 7PM-7AM, please contact night-coverage [email or web address] Password [email or web address]  11/22/2019, 3:51 AM

## 2019-11-22 NOTE — Consult Note (Signed)
Consultation  Referring Provider: Dr. Pietro Cassis  Primary Care Physician:  [name], MD Primary Gastroenterologist: [facility] but has [facility]  Reason for Consultation: Hematochezia         HPI:   Gary Butler is a 74 y.o. male with a past medical history significant for diabetes, OSA, obesity, hypertension with a recent hospitalization 9/27-10/06/2019 for perirectal abscess requiring surgical I&D, also had urinary retention during that stay and failed voiding trial and was DC'd with Foley in place, had new onset A. fib and started on Eliquis, who presented to the ER on 11/21/2019 with hematochezia.    Today, the patient describes that on 11/21/2019 he an episode of bright red blood and clots per rectum and he came to the ER.  He had a second episode while in the ER and has had 2 further today, one this morning and one just a few minutes ago.  Tells me that overall he is feeling well with no abdominal pain and no real dizziness but his stomach does hurt some after not being able to eat overnight.  Describes that he has never had issues of bleeding before.  His last colonoscopy was in 2019 as below.    Denies fever, chills, weight loss or change in bowel habits.  ER course: Hemoglobin 15, CT scan abdomen pelvis revealed decreased emphysema in the perineal tissue but no source of bleeding identified  GI history: 08/06/2017 colonoscopy with Dr. Earlean Shawl: Diverticulosis, splenic flexure, descending colon and sigmoid colon, internal hemorrhoids, external hemorrhoids and perianal fistula-repeat recommended in 5 years.  Past Medical History:  Diagnosis Date  . Arthritis   . Constipation   . Diabetes mellitus   . Heart murmur   . History of gout   . History of skin cancer   . Hypertension   . Morbid (severe) obesity due to excess calories ([place])   . OSA (obstructive sleep apnea)   . Psoriatic arthritis ([place])   . Rectal fissure   . Sleep apnea   . Tinnitus     Past  Surgical History:  Procedure Laterality Date  . addenoids    . ANAL FISTULOTOMY  2019   Dr Drue Flirt, [facility]  . IRRIGATION AND DEBRIDEMENT ABSCESS N/A 11/10/2019   Procedure: INCISION AND DRAINAGE OF HIGH SCROTAL ABSCESS;  Surgeon: Michael Boston, MD;  Location: [facility];  Service: General;  Laterality: N/A;  . JOINT REPLACEMENT  2012   RT KNEE  . LUNG BIOPSY     20 YRS AGO  . RECTAL EXAM UNDER ANESTHESIA N/A 11/10/2019   Procedure: ANORECTAL EXAM UNDER ANESTHESIA;  Surgeon: Michael Boston, MD;  Location: [facility];  Service: General;  Laterality: N/A;  . TONSILLECTOMY    . 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 Emphysema Father   . Clotting disorder Father   . Arthritis Father   . Heart disease Mother   . Arthritis Mother   . Arthritis Maternal Grandmother   . Hypertension Maternal Grandmother   . Diabetes Maternal Grandmother   . Arthritis Paternal Grandmother   . Diabetes Maternal Aunt   . Diabetes Paternal Aunt     Social History   Tobacco Use  . Smoking status: Never Smoker  .  Smokeless tobacco: Never Used  Vaping Use  . Vaping Use: Never used  Substance Use Topics  . Alcohol use: Yes    Alcohol/week: 0.0 standard drinks    Comment: Once a month  . Drug use: No    Prior to Admission medications   Medication Sig Start Date End Date Taking? Authorizing Provider  Albuterol Sulfate (PROAIR RESPICLICK) 542 (90 Base) MCG/ACT AEPB Inhale 2 puffs into the lungs every 6 (six) hours as needed (for wheezing or shortness of breath). 04/07/18  Yes Brunetta Jeans, PA-C  allopurinol (ZYLOPRIM) 300 MG tablet TAKE 1 TABLET(300 MG) BY MOUTH DAILY Patient taking differently: 300 mg daily.  01/13/18  Yes [name], MD   apixaban (ELIQUIS) 5 MG TABS tablet Take 1 tablet (5 mg total) by mouth 2 (two) times daily. 11/15/19 02/13/20 Yes Kayleen Memos, DO  aspirin 81 MG tablet Take 81 mg by mouth daily.   Yes [provider]  atorvastatin (LIPITOR) 40 MG tablet TAKE 1 TABLET(40 MG) BY MOUTH EVERY EVENING Patient taking differently: Take 40 mg by mouth daily.  07/06/19  Yes [name], MD  bisacodyl (DULCOLAX) 10 MG suppository Place 10 mg rectally as needed for moderate constipation (if not relieved by MOM).    Yes [provider]  bisoprolol (ZEBETA) 5 MG tablet Take 0.5 tablets (2.5 mg total) by mouth daily. 11/15/19 01/14/20 Yes Hall, Lorenda Cahill, DO  cephALEXin (KEFLEX) 500 MG capsule Take 2 capsules (1,000 mg total) by mouth 3 (three) times daily for 10 days. Patient taking differently: Take 1,000 mg by mouth 3 (three) times daily. 7 day supply 11/14/19 11/24/19 Yes Gross, Remo Lipps, MD  cetirizine (ZYRTEC) 10 MG tablet TAKE 1 TABLET(10 MG) BY MOUTH DAILY Patient taking differently: Take 10 mg by mouth daily. TAKE 1 TABLET(10 MG) BY MOUTH DAILY 02/09/19  Yes [name], MD  Cholecalciferol (VITAMIN D) 2000 UNITS tablet Take 2,000 Units by mouth daily.   Yes [provider]  Cyanocobalamin (B-12) 2000 MCG TABS Take 2,000 mcg by mouth daily.    Yes [provider]  donepezil (ARICEPT) 10 MG tablet Take 1 tablet (10 mg total) by mouth at bedtime. 08/10/19  Yes [name], MD  empagliflozin (JARDIANCE) 10 MG TABS tablet Take 10 mg by mouth daily before breakfast. 04/22/19  Yes [name], MD  feeding supplement, GLUCERNA SHAKE, (GLUCERNA SHAKE) LIQD Take 237 mLs by mouth daily.   Yes [provider]  folic acid (FOLVITE) 1 MG tablet Take 1 mg by mouth daily.  04/29/12  Yes [provider]  furosemide (LASIX) 20 MG tablet TAKE 1 TABLET(20 MG) BY MOUTH DAILY Patient taking differently: Take 20 mg by mouth daily. TAKE 1 TABLET(20 MG) BY MOUTH  DAILY 07/06/19  Yes [name], MD  magnesium hydroxide (MILK OF MAGNESIA) 400 MG/5ML suspension Take 30 mLs by mouth daily as needed for mild constipation. If no bowel movement in 3 days   Yes [provider]  meloxicam (MOBIC) 15 MG tablet Take 1 tablet by mouth daily. Patient taking differently: Take 15 mg by mouth daily. Take 1 tablet by mouth daily. 07/06/19  Yes [name], MD  metroNIDAZOLE (FLAGYL) 500 MG tablet Take 1 tablet (500 mg total) by mouth every 8 (eight) hours for 10 days. Patient taking differently: Take 500 mg by mouth every 8 (eight) hours. 7 day supply 11/14/19 11/24/19 Yes Gross, Remo Lipps, MD  omeprazole (PRILOSEC) 40 MG capsule TAKE 1  CAPSULE BY MOUTH 30 TO 60 MINUTES BEFORE YOUR FIRST AND LAST MEAL OF THE DAY Patient taking differently: Take 40 mg by mouth in the morning and at bedtime. TAKE 1 CAPSULE BY MOUTH 30 TO 60 MINUTES BEFORE YOUR FIRST AND LAST MEAL OF THE DAY 09/22/19  Yes Tanda Rockers, MD  OVER THE COUNTER MEDICATION Take 1 Bottle by mouth in the morning and at bedtime. Magic Cup   Yes [provider]  polyethylene glycol (MIRALAX / GLYCOLAX) 17 g packet Take 17 g by mouth daily as needed for mild constipation. Patient taking differently: Take 17 g by mouth daily as needed for mild constipation (if not relieved by saline enema).  11/15/19  Yes Hall, Carole N, DO  potassium chloride (MICRO-K) 10 MEQ CR capsule TAKE 1 CAPSULE BY MOUTH EVERY DAY Patient taking differently: Take 10 mEq by mouth daily. TAKE 1 CAPSULE BY MOUTH EVERY DAY 07/06/19  Yes [name], MD  psyllium (HYDROCIL/METAMUCIL) 95 % PACK Take 1 packet by mouth 2 (two) times daily. 11/15/19 02/13/20 Yes Hall, Carole N, DO  sertraline (ZOLOFT) 50 MG tablet TAKE 1 TABLET(50 MG) BY MOUTH DAILY. Patient taking differently: Take 50 mg by mouth daily. TAKE 1 TABLET(50 MG) BY MOUTH DAILY. 04/17/19  Yes [name], MD  Sodium Phosphates (RA SALINE ENEMA RE) Place 1  Bottle rectally daily as needed (constipation if not relieved by biscodyl supp.).   Yes [provider]  tamsulosin (FLOMAX) 0.4 MG CAPS capsule Take 1 capsule (0.4 mg total) by mouth daily after supper. 11/15/19 02/13/20 Yes Hall, Carole N, DO  tiZANidine (ZANAFLEX) 4 MG tablet Take 1 tablet (4 mg total) by mouth every 6 (six) hours as needed for muscle spasms. 07/26/16  Yes [name], MD  fluocinonide cream (LIDEX) 0.99 % Apply 1 application topically 2 (two) times daily. To arms for psoriasis Patient not taking: Reported on 11/22/2019 11/20/19   Mariea Clonts, Tiffany L, DO  fluticasone (FLONASE) 50 MCG/ACT nasal spray Place 2 sprays into both nostrils daily. Patient not taking: Reported on 11/22/2019 01/17/17   [name], MD    Current Facility-Administered Medications  Medication Dose Route Frequency Provider Last Rate Last Admin  . 0.9 %  sodium chloride infusion   Intravenous Continuous Norins, Heinz Knuckles, MD 10 mL/hr at 11/22/19 0522 New Bag at 11/22/19 0522  . acetaminophen (TYLENOL) tablet 650 mg  650 mg Oral Q6H PRN Norins, Heinz Knuckles, MD       Or  . acetaminophen (TYLENOL) suppository 650 mg  650 mg Rectal Q6H PRN Norins, Heinz Knuckles, MD      . albuterol (VENTOLIN HFA) 108 (90 Base) MCG/ACT inhaler 2 puff  2 puff Inhalation Q6H PRN Norins, Heinz Knuckles, MD      . allopurinol (ZYLOPRIM) tablet 300 mg  300 mg Oral Daily Norins, Heinz Knuckles, MD   300 mg at 11/22/19 0944  . atorvastatin (LIPITOR) tablet 40 mg  40 mg Oral Daily Norins, Heinz Knuckles, MD   40 mg at 11/22/19 0944  . bisoprolol (ZEBETA) tablet 2.5 mg  2.5 mg Oral Daily Norins, Heinz Knuckles, MD   2.5 mg at 11/22/19 0950  . cephALEXin (KEFLEX) capsule 1,000 mg  1,000 mg Oral TID Neena Rhymes, MD   1,000 mg at 11/22/19 0944  . cholecalciferol (VITAMIN D) tablet 2,000 Units  2,000 Units Oral Daily Norins, Heinz Knuckles, MD   2,000 Units at 11/22/19 [phone]  . donepezil (ARICEPT) tablet 10 mg  10 mg Oral QHS Norins, Heinz Knuckles, MD       . empagliflozin (JARDIANCE) tablet 10 mg  10 mg Oral QAC breakfast Norins, Heinz Knuckles, MD   10 mg at 11/22/19 0943  . fluticasone (FLONASE) 50 MCG/ACT nasal spray 2 spray  2 spray Each Nare Daily Norins, Heinz Knuckles, MD      . furosemide (LASIX) tablet 20 mg  20 mg Oral Daily Norins, Heinz Knuckles, MD   20 mg at 11/22/19 0950  . insulin aspart (novoLOG) injection 0-20 Units  0-20 Units Subcutaneous TID WC Norins, Heinz Knuckles, MD   3 Units at 11/22/19 1150  . loratadine (CLARITIN) tablet 10 mg  10 mg Oral Daily Norins, Heinz Knuckles, MD   10 mg at 11/22/19 0944  . metroNIDAZOLE (FLAGYL) tablet 500 mg  500 mg Oral Q8H Norins, Heinz Knuckles, MD   500 mg at 11/22/19 0525  . pantoprazole (PROTONIX) EC tablet 80 mg  80 mg Oral Daily Neena Rhymes, MD   80 mg at 11/22/19 0944  . potassium chloride (KLOR-CON) CR tablet 10 mEq  10 mEq Oral Daily Norins, Heinz Knuckles, MD   10 mEq at 11/22/19 0944  . sertraline (ZOLOFT) tablet 50 mg  50 mg Oral Daily Norins, Heinz Knuckles, MD   50 mg at 11/22/19 0945  . tamsulosin (FLOMAX) capsule 0.4 mg  0.4 mg Oral QPC supper Norins, Heinz Knuckles, MD      . tiZANidine (ZANAFLEX) tablet 4 mg  4 mg Oral Q6H PRN Norins, Heinz Knuckles, MD       Current Outpatient Medications  Medication Sig Dispense Refill  . Albuterol Sulfate (PROAIR RESPICLICK) 161 (90 Base) MCG/ACT AEPB Inhale 2 puffs into the lungs every 6 (six) hours as needed (for wheezing or shortness of breath). 1 each 3  . allopurinol (ZYLOPRIM) 300 MG tablet TAKE 1 TABLET(300 MG) BY MOUTH DAILY (Patient taking differently: 300 mg daily. ) 30 tablet 0  . apixaban (ELIQUIS) 5 MG TABS tablet Take 1 tablet (5 mg total) by mouth 2 (two) times daily. 180 tablet 0  . aspirin 81 MG tablet Take 81 mg by mouth daily.    Marland Kitchen atorvastatin (LIPITOR) 40 MG tablet TAKE 1 TABLET(40 MG) BY MOUTH EVERY EVENING (Patient taking differently: Take 40 mg by mouth daily. ) 90 tablet 1  . bisacodyl (DULCOLAX) 10 MG suppository Place 10 mg rectally as needed for  moderate constipation (if not relieved by MOM).     . bisoprolol (ZEBETA) 5 MG tablet Take 0.5 tablets (2.5 mg total) by mouth daily. 30 tablet 0  . cephALEXin (KEFLEX) 500 MG capsule Take 2 capsules (1,000 mg total) by mouth 3 (three) times daily for 10 days. (Patient taking differently: Take 1,000 mg by mouth 3 (three) times daily. 7 day supply) 60 capsule 2  . cetirizine (ZYRTEC) 10 MG tablet TAKE 1 TABLET(10 MG) BY MOUTH DAILY (Patient taking differently: Take 10 mg by mouth daily. TAKE 1 TABLET(10 MG) BY MOUTH DAILY) 30 tablet 0  . Cholecalciferol (VITAMIN D) 2000 UNITS tablet Take 2,000 Units by mouth daily.    . Cyanocobalamin (B-12) 2000 MCG TABS Take 2,000 mcg by mouth daily.     Marland Kitchen donepezil (ARICEPT) 10 MG tablet Take 1 tablet (10 mg total) by mouth at bedtime. 30 tablet 3  . empagliflozin (JARDIANCE) 10 MG TABS tablet Take 10 mg by mouth daily before breakfast. 30 tablet 6  . feeding supplement, GLUCERNA SHAKE, (GLUCERNA SHAKE) LIQD Take 237 mLs  by mouth daily.    . folic acid (FOLVITE) 1 MG tablet Take 1 mg by mouth daily.     . furosemide (LASIX) 20 MG tablet TAKE 1 TABLET(20 MG) BY MOUTH DAILY (Patient taking differently: Take 20 mg by mouth daily. TAKE 1 TABLET(20 MG) BY MOUTH DAILY) 90 tablet 1  . magnesium hydroxide (MILK OF MAGNESIA) 400 MG/5ML suspension Take 30 mLs by mouth daily as needed for mild constipation. If no bowel movement in 3 days    . meloxicam (MOBIC) 15 MG tablet Take 1 tablet by mouth daily. (Patient taking differently: Take 15 mg by mouth daily. Take 1 tablet by mouth daily.) 90 tablet 1  . metroNIDAZOLE (FLAGYL) 500 MG tablet Take 1 tablet (500 mg total) by mouth every 8 (eight) hours for 10 days. (Patient taking differently: Take 500 mg by mouth every 8 (eight) hours. 7 day supply) 30 tablet 1  . omeprazole (PRILOSEC) 40 MG capsule TAKE 1 CAPSULE BY MOUTH 30 TO 60 MINUTES BEFORE YOUR FIRST AND LAST MEAL OF THE DAY (Patient taking differently: Take 40 mg by mouth  in the morning and at bedtime. TAKE 1 CAPSULE BY MOUTH 30 TO 60 MINUTES BEFORE YOUR FIRST AND LAST MEAL OF THE DAY) 180 capsule 0  . OVER THE COUNTER MEDICATION Take 1 Bottle by mouth in the morning and at bedtime. Magic Cup    . polyethylene glycol (MIRALAX / GLYCOLAX) 17 g packet Take 17 g by mouth daily as needed for mild constipation. (Patient taking differently: Take 17 g by mouth daily as needed for mild constipation (if not relieved by saline enema). ) 14 each 0  . potassium chloride (MICRO-K) 10 MEQ CR capsule TAKE 1 CAPSULE BY MOUTH EVERY DAY (Patient taking differently: Take 10 mEq by mouth daily. TAKE 1 CAPSULE BY MOUTH EVERY DAY) 90 capsule 1  . psyllium (HYDROCIL/METAMUCIL) 95 % PACK Take 1 packet by mouth 2 (two) times daily. 180 packet 0  . sertraline (ZOLOFT) 50 MG tablet TAKE 1 TABLET(50 MG) BY MOUTH DAILY. (Patient taking differently: Take 50 mg by mouth daily. TAKE 1 TABLET(50 MG) BY MOUTH DAILY.) 90 tablet 1  . Sodium Phosphates (RA SALINE ENEMA RE) Place 1 Bottle rectally daily as needed (constipation if not relieved by biscodyl supp.).    Marland Kitchen tamsulosin (FLOMAX) 0.4 MG CAPS capsule Take 1 capsule (0.4 mg total) by mouth daily after supper. 90 capsule 0  . tiZANidine (ZANAFLEX) 4 MG tablet Take 1 tablet (4 mg total) by mouth every 6 (six) hours as needed for muscle spasms. 30 tablet 0  . fluocinonide cream (LIDEX) 4.09 % Apply 1 application topically 2 (two) times daily. To arms for psoriasis (Patient not taking: Reported on 11/22/2019) 30 g 0  . fluticasone (FLONASE) 50 MCG/ACT nasal spray Place 2 sprays into both nostrils daily. (Patient not taking: Reported on 11/22/2019) 16 g 6    Allergies as of 11/21/2019 - Review Complete 11/21/2019  Allergen Reaction Noted  . Penicillins Hives   . Lyrica [pregabalin] Swelling 10/22/2010  . Levofloxacin Swelling      Review of Systems:    Constitutional: No weight loss, fever or chills Skin: No rash  Cardiovascular: No chest  pain Respiratory: No SOB  Gastrointestinal: See HPI and otherwise negative Genitourinary: No dysuria  Neurological: No headache, dizziness or syncope Musculoskeletal: No new muscle or joint pain Hematologic: No bruising Psychiatric: No history of depression or anxiety    Physical Exam:  Vital signs in last 24  hours: Temp:  [98.1 F (36.7 C)-98.4 F (36.9 C)] 98.1 F (36.7 C) (10/10 0022) Pulse Rate:  [54-93] 64 (10/10 1100) Resp:  [11-23] 12 (10/10 1100) BP: (105-131)/(55-90) 106/69 (10/10 1100) SpO2:  [90 %-100 %] 98 % (10/10 1100) Weight:  [118.4 kg] 118.4 kg (10/09 2309)   General:   Very Pleasant Caucasian male appears to be in NAD, Well developed, Well nourished, alert and cooperative Head:  Normocephalic and atraumatic. Eyes:   PEERL, EOMI. No icterus. Conjunctiva pink. Ears:  Normal auditory acuity. Neck:  Supple Throat: Oral cavity and pharynx without inflammation, swelling or lesion. Lungs: Respirations even and unlabored. Lungs clear to auscultation bilaterally.   No wheezes, crackles, or rhonchi.  Heart: Normal S1, S2. No MRG. Regular rate and rhythm. No peripheral edema, cyanosis or pallor.  Abdomen:  Soft, nondistended, nontender. No rebound or guarding. Normal bowel sounds. No appreciable masses or hepatomegaly. Rectal:  Large amount of maroon blood with clots from rectum Urinary: +hematuria in cath bag Msk:  Symmetrical without gross deformities. Peripheral pulses intact.  Extremities:  Without edema, no deformity or joint abnormality.  Neurologic:  Alert and  oriented x4;  grossly normal neurologically.  Skin:   Dry and intact without significant lesions or rashes. Psychiatric: Demonstrates good judgement and reason without abnormal affect or behaviors.   LAB RESULTS: Recent Labs    11/21/19 2255 11/22/19 0500  WBC 7.3  --   HGB 15.0 13.4  HCT 45.2  --   PLT 254  --    BMET Recent Labs    11/21/19 2255  NA 139  K 3.2*  CL 103  CO2 24  GLUCOSE  142*  BUN 7*  CREATININE 0.98  CALCIUM 8.8*   LFT Recent Labs    11/21/19 2255  PROT 5.9*  ALBUMIN 2.9*  AST 22  ALT 13  ALKPHOS 66  BILITOT 1.2   PT/INR Recent Labs    11/22/19 0135  LABPROT 16.0*  INR 1.3*    STUDIES: CT ABDOMEN PELVIS W CONTRAST  Result Date: 11/22/2019 CLINICAL DATA:  Chronic anticoagulation, rectal bleeding EXAM: CT ABDOMEN AND PELVIS WITH CONTRAST TECHNIQUE: Multidetector CT imaging of the abdomen and pelvis was performed using the standard protocol following bolus administration of intravenous contrast. CONTRAST:  135mL OMNIPAQUE IOHEXOL 300 MG/ML  SOLN COMPARISON:  11/09/2019 FINDINGS: Lower chest: Tiny bilateral pleural effusions are present with associated mild bibasilar compressive atelectasis. Extensive calcification of the aortic valve leaflets. Global cardiac size within normal limits. Hepatobiliary: Cholelithiasis without pericholecystic inflammatory change. Mild hepatic steatosis. No intra or extrahepatic biliary ductal dilation. Pancreas: Unremarkable Spleen: Unremarkable Adrenals/Urinary Tract: Moderate bilateral renal cortical atrophy. Multiple simple cortical cysts are seen within the kidneys bilaterally. No hydronephrosis. No intrarenal or ureteral calculi. The adrenal glands are unremarkable. The bladder is decompressed with a Foley catheter balloon seen within its lumen. Stomach/Bowel: Stomach, small bowel, and large bowel are unremarkable save for a few scattered descending and sigmoid colonic diverticula. The appendix is normal. No free intraperitoneal gas or fluid. Vascular/Lymphatic: Mild aortoiliac atherosclerotic calcification. No aortic aneurysm. The abdominal vasculature is otherwise unremarkable. No pathologic adenopathy within the abdomen and pelvis. Reproductive: Prostate is unremarkable. Other: Extensive subcutaneous gas noted within the left perineum and gluteal folds has improved in the interval since the prior examination. Mild  residual inflammatory subcutaneous infiltration is seen within this region, best appreciated on axial image # 102/2. No discrete drainable fluid collection. Rectum unremarkable. Musculoskeletal: No lytic or blastic bone lesion is seen.  IMPRESSION: No definite source identified on this non arteriographic study for the patient's reported gastrointestinal hemorrhage. Improved subcutaneous gas within the perineum and left gluteal crease. Interval Foley decompression of the bladder. Additional incidental findings as noted above. Aortic Atherosclerosis (ICD10-I70.0). Electronically Signed   By: Fidela Salisbury MD   On: 11/22/2019 01:16    Impression / Plan:   Impression: 1.  Hematochezia: New onset 11/21/2019, recently restarted on Eliquis during recent hospitalization discharged 10/5, hemoglobin stable but last check was 5 AM, BUN seems stable, now with a large amount of maroon blood and clots on time my exam as well as hematuria; most likely bleeding is exacerbated from Eliquis, consider diverticular most likely 2.  HFpEF: Last echo 9/21 with EF 60-65% 3.  Diabetes 4.  A. fib: Eliquis currently on hold since 11/21/2019 5.  OSA  Plan: 1.  With large amount of bleeding seen at time of exam today with 2 others this morning per patient recommend a stat CTA for further evaluation. 2.  Patient will also need further evaluation of hematuria seen at time of exam today. 3.  Agree with holding Eliquis.  His last dose was 11/21/2018 1 AM 4.  Patient may need to undergo endoscopic work-up while he is here, certainly if there are plans to restart his Eliquis, though at this point it seems to have a higher risk than benefit. 5.  Continue to monitor hemoglobin with transfusion as needed less than 7.  Ordered another stat hemoglobin with every 6 checks 6.  Please await any further recommendations from Dr. Havery Moros later today.  Thank you for your kind consultation, we will continue to follow.  Gary Butler   11/22/2019, 12:24 PM

## 2019-11-22 NOTE — Consult Note (Signed)
[name]  Referring Provider: [facility] hospitalist/Dr.Dahal Primary Care Physician:  [name], MD Primary Gastroenterologist: Rae Halsted  Reason for Consultation: Rectal bleeding  HPI: Gary Butler is a 74 y.o. male was transferred from [facility] rehab facility in [place] with rectal bleeding.  Patient states that normally he has small amount of blood in his stool which he attributes to hemorrhoids, however for the last 24 hours he has had multiple episodes of large amount of bright red blood and clots for which he was transferred and brought to the ER. This is not associated with abdominal pain. Patient is on Eliquis for atrial fibrillation, likely last dose was yesterday.  Patient underwent incision and drainage of a high perirectal abscess on 11/10/2019 by Dr. Johney Maine, was noted to have hemorrhoids without thrombosis at that point. He was discharged on Keflex and Flagyl for 10 days and fluconazole.  Patient had a colonoscopy in 2019 and was found to have left-sided diverticulosis and internal hemorrhoids.  He has also had a colonoscopy in 2014 with similar findings.  Patient otherwise denies nausea, vomiting, fever, but complains of longstanding history of acid reflux.  He has been evaluated for difficulty swallowing for the past 1 year and review of care everywhere showed he had an EGD with dilatation on 11/03/2019. He was recommended to have a barium swallow or an esophageal pH and manometry as an outpatient  Patient complains of having loose stools and explosive diarrhea for several months. Stool test was negative for C. difficile on 10/27/2019. Outside stool studies showed elevated fecal calprotectin and lactoferrin. 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  His wife was present at bedside. She reports significant weight loss of almost 70 pounds in the past 1 year.   Past Medical History:  Diagnosis Date  . Arthritis   .  Constipation   . Diabetes mellitus   . Heart murmur   . History of gout   . History of skin cancer   . Hypertension   . Morbid (severe) obesity due to excess calories ([name])   . OSA (obstructive sleep apnea)   . Psoriatic arthritis ([name])   . Rectal fissure   . Sleep apnea   . Tinnitus     Past Surgical History:  Procedure Laterality Date  . addenoids    . ANAL FISTULOTOMY  2019   Dr Drue Flirt, [facility]  . IRRIGATION AND DEBRIDEMENT ABSCESS N/A 11/10/2019   Procedure: INCISION AND DRAINAGE OF HIGH SCROTAL ABSCESS;  Surgeon: Michael Boston, MD;  Location: [facility];  Service: General;  Laterality: N/A;  . JOINT REPLACEMENT  2012   RT KNEE  . LUNG BIOPSY     20 YRS AGO  . RECTAL EXAM UNDER ANESTHESIA N/A 11/10/2019   Procedure: ANORECTAL EXAM UNDER ANESTHESIA;  Surgeon: Michael Boston, MD;  Location: [facility];  Service: General;  Laterality: N/A;  . TONSILLECTOMY    . TOTAL KNEE ARTHROPLASTY Bilateral 06/22/2013   Procedure: LEFT TOTAL KNEE ARTHROPLASTY;  MEDIAL SOFT TISSUE EXPLORATION SAPHENOUS NEURECTOMY RIGHT KNEE;  Surgeon: Mauri Pole, MD;  Location: [facility];  Service: Orthopedics;  Laterality: Bilateral;    Prior to Admission medications   Medication Sig Start Date End Date Taking? Authorizing Provider  Albuterol Sulfate (PROAIR RESPICLICK) 675 (90 Base) MCG/ACT AEPB Inhale 2 puffs into the lungs every 6 (six) hours as needed (for wheezing or shortness of breath). 04/07/18  Yes Brunetta Jeans, PA-C  allopurinol (ZYLOPRIM) 300 MG tablet TAKE  1 TABLET(300 MG) BY MOUTH DAILY Patient taking differently: 300 mg daily.  01/13/18  Yes [name], MD  apixaban (ELIQUIS) 5 MG TABS tablet Take 1 tablet (5 mg total) by mouth 2 (two) times daily. 11/15/19 02/13/20 Yes Kayleen Memos, DO  aspirin 81 MG tablet Take 81 mg by mouth daily.   Yes [provider]  atorvastatin (LIPITOR) 40 MG tablet TAKE 1 TABLET(40 MG) BY MOUTH EVERY EVENING Patient taking differently: Take 40 mg by mouth  daily.  07/06/19  Yes [name], MD  bisacodyl (DULCOLAX) 10 MG suppository Place 10 mg rectally as needed for moderate constipation (if not relieved by MOM).    Yes [provider]  bisoprolol (ZEBETA) 5 MG tablet Take 0.5 tablets (2.5 mg total) by mouth daily. 11/15/19 01/14/20 Yes Hall, Lorenda Cahill, DO  cephALEXin (KEFLEX) 500 MG capsule Take 2 capsules (1,000 mg total) by mouth 3 (three) times daily for 10 days. Patient taking differently: Take 1,000 mg by mouth 3 (three) times daily. 7 day supply 11/14/19 11/24/19 Yes Gross, Remo Lipps, MD  cetirizine (ZYRTEC) 10 MG tablet TAKE 1 TABLET(10 MG) BY MOUTH DAILY Patient taking differently: Take 10 mg by mouth daily. TAKE 1 TABLET(10 MG) BY MOUTH DAILY 02/09/19  Yes [name], MD  Cholecalciferol (VITAMIN D) 2000 UNITS tablet Take 2,000 Units by mouth daily.   Yes [provider]  Cyanocobalamin (B-12) 2000 MCG TABS Take 2,000 mcg by mouth daily.    Yes [provider]  donepezil (ARICEPT) 10 MG tablet Take 1 tablet (10 mg total) by mouth at bedtime. 08/10/19  Yes [name], MD  empagliflozin (JARDIANCE) 10 MG TABS tablet Take 10 mg by mouth daily before breakfast. 04/22/19  Yes [name], MD  feeding supplement, GLUCERNA SHAKE, (GLUCERNA SHAKE) LIQD Take 237 mLs by mouth daily.   Yes [provider]  folic acid (FOLVITE) 1 MG tablet Take 1 mg by mouth daily.  04/29/12  Yes [provider]  furosemide (LASIX) 20 MG tablet TAKE 1 TABLET(20 MG) BY MOUTH DAILY Patient taking differently: Take 20 mg by mouth daily. TAKE 1 TABLET(20 MG) BY MOUTH DAILY 07/06/19  Yes [name], MD  magnesium hydroxide (MILK OF MAGNESIA) 400 MG/5ML suspension Take 30 mLs by mouth daily as needed for mild constipation. If no bowel movement in 3 days   Yes [provider]  meloxicam (MOBIC) 15 MG tablet Take 1 tablet by mouth daily. Patient taking differently: Take 15 mg by mouth daily.  Take 1 tablet by mouth daily. 07/06/19  Yes [name], MD  metroNIDAZOLE (FLAGYL) 500 MG tablet Take 1 tablet (500 mg total) by mouth every 8 (eight) hours for 10 days. Patient taking differently: Take 500 mg by mouth every 8 (eight) hours. 7 day supply 11/14/19 11/24/19 Yes Gross, Remo Lipps, MD  omeprazole (PRILOSEC) 40 MG capsule TAKE 1 CAPSULE BY MOUTH 30 TO 60 MINUTES BEFORE YOUR FIRST AND LAST MEAL OF THE DAY Patient taking differently: Take 40 mg by mouth in the morning and at bedtime. TAKE 1 CAPSULE BY MOUTH 30 TO 60 MINUTES BEFORE YOUR FIRST AND LAST MEAL OF THE DAY 09/22/19  Yes Tanda Rockers, MD  OVER THE COUNTER MEDICATION Take 1 Bottle by mouth in the morning and at bedtime. Magic Cup   Yes [provider]  polyethylene glycol (MIRALAX / GLYCOLAX) 17 g packet Take 17 g by mouth daily as needed for mild constipation. Patient taking  differently: Take 17 g by mouth daily as needed for mild constipation (if not relieved by saline enema).  11/15/19  Yes Hall, Carole N, DO  potassium chloride (MICRO-K) 10 MEQ CR capsule TAKE 1 CAPSULE BY MOUTH EVERY DAY Patient taking differently: Take 10 mEq by mouth daily. TAKE 1 CAPSULE BY MOUTH EVERY DAY 07/06/19  Yes [name], MD  psyllium (HYDROCIL/METAMUCIL) 95 % PACK Take 1 packet by mouth 2 (two) times daily. 11/15/19 02/13/20 Yes Hall, Carole N, DO  sertraline (ZOLOFT) 50 MG tablet TAKE 1 TABLET(50 MG) BY MOUTH DAILY. Patient taking differently: Take 50 mg by mouth daily. TAKE 1 TABLET(50 MG) BY MOUTH DAILY. 04/17/19  Yes [name], MD  Sodium Phosphates (RA SALINE ENEMA RE) Place 1 Bottle rectally daily as needed (constipation if not relieved by biscodyl supp.).   Yes [provider]  tamsulosin (FLOMAX) 0.4 MG CAPS capsule Take 1 capsule (0.4 mg total) by mouth daily after supper. 11/15/19 02/13/20 Yes Hall, Carole N, DO  tiZANidine (ZANAFLEX) 4 MG tablet Take 1 tablet (4 mg total) by mouth every 6 (six) hours as  needed for muscle spasms. 07/26/16  Yes [name], MD  fluocinonide cream (LIDEX) 3.24 % Apply 1 application topically 2 (two) times daily. To arms for psoriasis Patient not taking: Reported on 11/22/2019 11/20/19   Mariea Clonts, Tiffany L, DO  fluticasone (FLONASE) 50 MCG/ACT nasal spray Place 2 sprays into both nostrils daily. Patient not taking: Reported on 11/22/2019 01/17/17   [name], MD    Current Facility-Administered Medications  Medication Dose Route Frequency Provider Last Rate Last Admin  . 0.9 %  sodium chloride infusion   Intravenous Continuous Norins, Heinz Knuckles, MD 10 mL/hr at 11/22/19 0522 New Bag at 11/22/19 0522  . acetaminophen (TYLENOL) tablet 650 mg  650 mg Oral Q6H PRN Norins, Heinz Knuckles, MD       Or  . acetaminophen (TYLENOL) suppository 650 mg  650 mg Rectal Q6H PRN Norins, Heinz Knuckles, MD      . albuterol (VENTOLIN HFA) 108 (90 Base) MCG/ACT inhaler 2 puff  2 puff Inhalation Q6H PRN Norins, Heinz Knuckles, MD      . allopurinol (ZYLOPRIM) tablet 300 mg  300 mg Oral Daily Norins, Heinz Knuckles, MD   300 mg at 11/22/19 0944  . atorvastatin (LIPITOR) tablet 40 mg  40 mg Oral Daily Norins, Heinz Knuckles, MD   40 mg at 11/22/19 0944  . bisoprolol (ZEBETA) tablet 2.5 mg  2.5 mg Oral Daily Norins, Heinz Knuckles, MD   2.5 mg at 11/22/19 0950  . cephALEXin (KEFLEX) capsule 1,000 mg  1,000 mg Oral TID Neena Rhymes, MD   1,000 mg at 11/22/19 0944  . cholecalciferol (VITAMIN D) tablet 2,000 Units  2,000 Units Oral Daily Norins, Heinz Knuckles, MD   2,000 Units at 11/22/19 [phone]  . donepezil (ARICEPT) tablet 10 mg  10 mg Oral QHS Norins, Heinz Knuckles, MD      . empagliflozin (JARDIANCE) tablet 10 mg  10 mg Oral QAC breakfast Norins, Heinz Knuckles, MD   10 mg at 11/22/19 0943  . fluticasone (FLONASE) 50 MCG/ACT nasal spray 2 spray  2 spray Each Nare Daily Norins, Heinz Knuckles, MD      . furosemide (LASIX) tablet 20 mg  20 mg Oral Daily Norins, Heinz Knuckles, MD   20 mg at 11/22/19 0950  . insulin aspart  (novoLOG) injection 0-20 Units  0-20 Units Subcutaneous TID WC Norins, Legrand Como  E, MD   3 Units at 11/22/19 1150  . loratadine (CLARITIN) tablet 10 mg  10 mg Oral Daily Norins, Heinz Knuckles, MD   10 mg at 11/22/19 0944  . metroNIDAZOLE (FLAGYL) tablet 500 mg  500 mg Oral Q8H Norins, Heinz Knuckles, MD   500 mg at 11/22/19 0525  . pantoprazole (PROTONIX) EC tablet 80 mg  80 mg Oral Daily Neena Rhymes, MD   80 mg at 11/22/19 0944  . potassium chloride (KLOR-CON) CR tablet 10 mEq  10 mEq Oral Daily Norins, Heinz Knuckles, MD   10 mEq at 11/22/19 0944  . sertraline (ZOLOFT) tablet 50 mg  50 mg Oral Daily Norins, Heinz Knuckles, MD   50 mg at 11/22/19 0945  . sodium chloride (PF) 0.9 % injection           . tamsulosin (FLOMAX) capsule 0.4 mg  0.4 mg Oral QPC supper Norins, Heinz Knuckles, MD      . tiZANidine (ZANAFLEX) tablet 4 mg  4 mg Oral Q6H PRN Norins, Heinz Knuckles, MD       Current Outpatient Medications  Medication Sig Dispense Refill  . Albuterol Sulfate (PROAIR RESPICLICK) 865 (90 Base) MCG/ACT AEPB Inhale 2 puffs into the lungs every 6 (six) hours as needed (for wheezing or shortness of breath). 1 each 3  . allopurinol (ZYLOPRIM) 300 MG tablet TAKE 1 TABLET(300 MG) BY MOUTH DAILY (Patient taking differently: 300 mg daily. ) 30 tablet 0  . apixaban (ELIQUIS) 5 MG TABS tablet Take 1 tablet (5 mg total) by mouth 2 (two) times daily. 180 tablet 0  . aspirin 81 MG tablet Take 81 mg by mouth daily.    Marland Kitchen atorvastatin (LIPITOR) 40 MG tablet TAKE 1 TABLET(40 MG) BY MOUTH EVERY EVENING (Patient taking differently: Take 40 mg by mouth daily. ) 90 tablet 1  . bisacodyl (DULCOLAX) 10 MG suppository Place 10 mg rectally as needed for moderate constipation (if not relieved by MOM).     . bisoprolol (ZEBETA) 5 MG tablet Take 0.5 tablets (2.5 mg total) by mouth daily. 30 tablet 0  . cephALEXin (KEFLEX) 500 MG capsule Take 2 capsules (1,000 mg total) by mouth 3 (three) times daily for 10 days. (Patient taking differently: Take  1,000 mg by mouth 3 (three) times daily. 7 day supply) 60 capsule 2  . cetirizine (ZYRTEC) 10 MG tablet TAKE 1 TABLET(10 MG) BY MOUTH DAILY (Patient taking differently: Take 10 mg by mouth daily. TAKE 1 TABLET(10 MG) BY MOUTH DAILY) 30 tablet 0  . Cholecalciferol (VITAMIN D) 2000 UNITS tablet Take 2,000 Units by mouth daily.    . Cyanocobalamin (B-12) 2000 MCG TABS Take 2,000 mcg by mouth daily.     Marland Kitchen donepezil (ARICEPT) 10 MG tablet Take 1 tablet (10 mg total) by mouth at bedtime. 30 tablet 3  . empagliflozin (JARDIANCE) 10 MG TABS tablet Take 10 mg by mouth daily before breakfast. 30 tablet 6  . feeding supplement, GLUCERNA SHAKE, (GLUCERNA SHAKE) LIQD Take 237 mLs by mouth daily.    . folic acid (FOLVITE) 1 MG tablet Take 1 mg by mouth daily.     . furosemide (LASIX) 20 MG tablet TAKE 1 TABLET(20 MG) BY MOUTH DAILY (Patient taking differently: Take 20 mg by mouth daily. TAKE 1 TABLET(20 MG) BY MOUTH DAILY) 90 tablet 1  . magnesium hydroxide (MILK OF MAGNESIA) 400 MG/5ML suspension Take 30 mLs by mouth daily as needed for mild constipation. If no bowel movement in 3 days    .  meloxicam (MOBIC) 15 MG tablet Take 1 tablet by mouth daily. (Patient taking differently: Take 15 mg by mouth daily. Take 1 tablet by mouth daily.) 90 tablet 1  . metroNIDAZOLE (FLAGYL) 500 MG tablet Take 1 tablet (500 mg total) by mouth every 8 (eight) hours for 10 days. (Patient taking differently: Take 500 mg by mouth every 8 (eight) hours. 7 day supply) 30 tablet 1  . omeprazole (PRILOSEC) 40 MG capsule TAKE 1 CAPSULE BY MOUTH 30 TO 60 MINUTES BEFORE YOUR FIRST AND LAST MEAL OF THE DAY (Patient taking differently: Take 40 mg by mouth in the morning and at bedtime. TAKE 1 CAPSULE BY MOUTH 30 TO 60 MINUTES BEFORE YOUR FIRST AND LAST MEAL OF THE DAY) 180 capsule 0  . OVER THE COUNTER MEDICATION Take 1 Bottle by mouth in the morning and at bedtime. Magic Cup    . polyethylene glycol (MIRALAX / GLYCOLAX) 17 g packet Take 17 g by  mouth daily as needed for mild constipation. (Patient taking differently: Take 17 g by mouth daily as needed for mild constipation (if not relieved by saline enema). ) 14 each 0  . potassium chloride (MICRO-K) 10 MEQ CR capsule TAKE 1 CAPSULE BY MOUTH EVERY DAY (Patient taking differently: Take 10 mEq by mouth daily. TAKE 1 CAPSULE BY MOUTH EVERY DAY) 90 capsule 1  . psyllium (HYDROCIL/METAMUCIL) 95 % PACK Take 1 packet by mouth 2 (two) times daily. 180 packet 0  . sertraline (ZOLOFT) 50 MG tablet TAKE 1 TABLET(50 MG) BY MOUTH DAILY. (Patient taking differently: Take 50 mg by mouth daily. TAKE 1 TABLET(50 MG) BY MOUTH DAILY.) 90 tablet 1  . Sodium Phosphates (RA SALINE ENEMA RE) Place 1 Bottle rectally daily as needed (constipation if not relieved by biscodyl supp.).    Marland Kitchen tamsulosin (FLOMAX) 0.4 MG CAPS capsule Take 1 capsule (0.4 mg total) by mouth daily after supper. 90 capsule 0  . tiZANidine (ZANAFLEX) 4 MG tablet Take 1 tablet (4 mg total) by mouth every 6 (six) hours as needed for muscle spasms. 30 tablet 0  . fluocinonide cream (LIDEX) 7.90 % Apply 1 application topically 2 (two) times daily. To arms for psoriasis (Patient not taking: Reported on 11/22/2019) 30 g 0  . fluticasone (FLONASE) 50 MCG/ACT nasal spray Place 2 sprays into both nostrils daily. (Patient not taking: Reported on 11/22/2019) 16 g 6    Allergies as of 11/21/2019 - Review Complete 11/21/2019  Allergen Reaction Noted  . Penicillins Hives   . Lyrica [pregabalin] Swelling 10/22/2010  . Levofloxacin Swelling     Family History  Problem Relation Age of Onset  . Emphysema Father   . Clotting disorder Father   . Arthritis Father   . Heart disease Mother   . Arthritis Mother   . Arthritis Maternal Grandmother   . Hypertension Maternal Grandmother   . Diabetes Maternal Grandmother   . Arthritis Paternal Grandmother   . Diabetes Maternal Aunt   . Diabetes Paternal Aunt     Social History   Socioeconomic History  .  Marital status: Married    Spouse name: Not on file  . Number of children: 2  . Years of education: Not on file  . Highest education level: Not on file  Occupational History  . 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 Smoking status: Never Smoker  . Smokeless tobacco: Never Used  Vaping Use  . Vaping  Use: Never used  Substance and Sexual Activity  . Alcohol use: Yes    Alcohol/week: 0.0 standard drinks    Comment: Once a month  . Drug use: No  . Sexual activity: Never  Other Topics Concern  . Not on file  Social History Narrative   Alcohol- 1 to 2 per month.    Social Determinants of Health   Financial Resource Strain:   . Difficulty of Paying Living Expenses: Not on file  Food Insecurity:   . Worried About Charity fundraiser in the Last Year: Not on file  . Ran Out of Food in the Last Year: Not on file  Transportation Needs:   . Lack of Transportation (Medical): Not on file  . Lack of Transportation (Non-Medical): Not on file  Physical Activity:   . Days of Exercise per Week: Not on file  . Minutes of Exercise per Session: Not on file  Stress:   . Feeling of Stress : Not on file  Social Connections:   . Frequency of Communication with Friends and Family: Not on file  . Frequency of Social Gatherings with Friends and Family: Not on file  . Attends Religious Services: Not on file  . Active Member of Clubs or Organizations: Not on file  . Attends Archivist Meetings: Not on file  . Marital Status: Not on file  Intimate Partner Violence:   . Fear of Current or Ex-Partner: Not on file  . Emotionally Abused: Not on file  . Physically Abused: Not on file  . Sexually Abused: Not on file    Review of Systems: Positive for: GI: Described in detail in HPI.    Gen: involuntary weight loss, denies any fever, chills, rigors, night sweats, anorexia, fatigue, weakness, malaise and sleep disorder CV: Denies  chest pain, angina, palpitations, syncope, orthopnea, PND, peripheral edema, and claudication. Resp: Denies dyspnea, cough, sputum, wheezing, coughing up blood. GU : Has an indwelling urinary catheter. MS: Denies joint pain or swelling.  Denies muscle weakness, cramps, atrophy.  Derm: perineal wound, denies rash, itching, oral ulcerations, hives.  Psych: memory loss, denies depression, anxiety, suicidal ideation, hallucinations,  and confusion. Heme: Denies bruising, bleeding, and enlarged lymph nodes. Neuro:  Denies any headaches, dizziness, paresthesias. Endo:  DM, denies any problems with thyroid, adrenal function.  Physical Exam: Vital signs in last 24 hours: Temp:  [98.1 F (36.7 C)-98.4 F (36.9 C)] 98.1 F (36.7 C) (10/10 0022) Pulse Rate:  [54-93] 64 (10/10 1100) Resp:  [11-23] 12 (10/10 1100) BP: (105-131)/(55-90) 106/69 (10/10 1100) SpO2:  [90 %-100 %] 98 % (10/10 1100) Weight:  [118.4 kg] 118.4 kg (10/09 2309)    General:   Alert,  Well-developed, morbidly obese, pleasant and cooperative in NAD Head:  Normocephalic and atraumatic. Eyes:  Sclera clear, no icterus.   Conjunctiva pink. Ears:  Normal auditory acuity. Nose:  No deformity, discharge,  or lesions. Mouth:  No deformity or lesions.  Oropharynx pink & moist. Neck:  Supple; no masses or thyromegaly. Lungs:  Clear throughout to auscultation.   No wheezes, crackles, or rhonchi. No acute distress. Heart: Irregular rhythm, normal rate Extremities:  Without clubbing or edema. Neurologic:  Alert and  oriented x4;  grossly normal neurologically. Skin:  Intact without significant lesions or rashes. Psych:  Alert and cooperative. Normal mood and affect. Abdomen:  Soft, nontender and nondistended. No masses, hepatosplenomegaly or hernias noted. Normal bowel sounds, without guarding, and without rebound.      Rectal exam:  Patient did not allow digital rectal exam however blood and clots were noted around the anal opening  along with a perineal wound(recent incision and drainage of perirectal abscess) without associated discharge    Lab Results: Recent Labs    11/21/19 2255 11/22/19 0500  WBC 7.3  --   HGB 15.0 13.4  HCT 45.2  --   PLT 254  --    BMET Recent Labs    11/21/19 2255  NA 139  K 3.2*  CL 103  CO2 24  GLUCOSE 142*  BUN 7*  CREATININE 0.98  CALCIUM 8.8*   LFT Recent Labs    11/21/19 2255  PROT 5.9*  ALBUMIN 2.9*  AST 22  ALT 13  ALKPHOS 66  BILITOT 1.2   PT/INR Recent Labs    11/22/19 0135  LABPROT 16.0*  INR 1.3*    Studies/Results: CT ABDOMEN PELVIS W CONTRAST  Result Date: 11/22/2019 CLINICAL DATA:  Chronic anticoagulation, rectal bleeding EXAM: CT ABDOMEN AND PELVIS WITH CONTRAST TECHNIQUE: Multidetector CT imaging of the abdomen and pelvis was performed using the standard protocol following bolus administration of intravenous contrast. CONTRAST:  168mL OMNIPAQUE IOHEXOL 300 MG/ML  SOLN COMPARISON:  11/09/2019 FINDINGS: Lower chest: Tiny bilateral pleural effusions are present with associated mild bibasilar compressive atelectasis. Extensive calcification of the aortic valve leaflets. Global cardiac size within normal limits. Hepatobiliary: Cholelithiasis without pericholecystic inflammatory change. Mild hepatic steatosis. No intra or extrahepatic biliary ductal dilation. Pancreas: Unremarkable Spleen: Unremarkable Adrenals/Urinary Tract: Moderate bilateral renal cortical atrophy. Multiple simple cortical cysts are seen within the kidneys bilaterally. No hydronephrosis. No intrarenal or ureteral calculi. The adrenal glands are unremarkable. The bladder is decompressed with a Foley catheter balloon seen within its lumen. Stomach/Bowel: Stomach, small bowel, and large bowel are unremarkable save for a few scattered descending and sigmoid colonic diverticula. The appendix is normal. No free intraperitoneal gas or fluid. Vascular/Lymphatic: Mild aortoiliac atherosclerotic  calcification. No aortic aneurysm. The abdominal vasculature is otherwise unremarkable. No pathologic adenopathy within the abdomen and pelvis. Reproductive: Prostate is unremarkable. Other: Extensive subcutaneous gas noted within the left perineum and gluteal folds has improved in the interval since the prior examination. Mild residual inflammatory subcutaneous infiltration is seen within this region, best appreciated on axial image # 102/2. No discrete drainable fluid collection. Rectum unremarkable. Musculoskeletal: No lytic or blastic bone lesion is seen. IMPRESSION: No definite source identified on this non arteriographic study for the patient's reported gastrointestinal hemorrhage. Improved subcutaneous gas within the perineum and left gluteal crease. Interval Foley decompression of the bladder. Additional incidental findings as noted above. Aortic Atherosclerosis (ICD10-I70.0). Electronically Signed   By: Fidela Salisbury MD   On: 11/22/2019 01:16    Impression: Painless hematochezia Scattered diverticulosis in descending and sigmoid colon likely cause of bleeding, likely worsened with use of Eliquis  CT abdomen and pelvis with contrast performed without intraperitoneal gas/fluid Hemoglobin 15 on presentation yesterday, has dropped to 13.4 Normal BUN/creatinine ratio of 7/0.98, normal GFR Mildly elevated lactic acid of 2.2 Hypokalemia, potassium 3.2 low total protein and albumin of 5.9 and 2.9 respectively Minimally elevated PT/INR of 16/1.3 Hemodynamically stable  Recent incision and drainage of a high perirectal abscess. Continued on Keflex and Flagyl. Multiple comorbidities: Morbid obesity, atrial fibrillation, rate controlled, diabetes, hypertension, obstructive sleep apnea   Plan: Conservative management.  Discussed with patient and his wife at bedside that most cases of diverticular bleed resolve on their own, however, some may continue and become life-threatening.  Recommend H&H  monitoring every 8 hours, transfuse PRBC if needed.  If there is continued hematochezia or hemodynamic compromise, recommend nuclear bleeding scan, if positive will need CTA and IR guided embolization.  Patient may need Kcentra for Eliquis reversal if there is continued bleeding.     LOS: 0 days   Ronnette Juniper, MD  11/22/2019, 12:02 PM

## 2019-11-22 NOTE — ED Notes (Signed)
Patient's wife at bedside.

## 2019-11-22 NOTE — ED Notes (Signed)
Attempted to call report, RN giving pt pain medications. Floor RN will call back.

## 2019-11-22 NOTE — ED Notes (Signed)
Pt transported to CT ?

## 2019-11-22 NOTE — Progress Notes (Signed)
PROGRESS NOTE  Gary Butler  DOB: 01-Jan-1946  PCP: [name], MD NOM:[id number]  DOA: 11/21/2019  LOS: 0 days   Chief Complaint  Patient presents with  . Rectal Bleeding   Brief narrative: Gary Butler is a 74 y.o. male with PMH of obesity, sleep apnea, HTN, DM 2, psoriatic arthritis who was recently hospitalized 9/27-10/06/2019 for perirectal abscess requiring surgical I&D.  During that stay, he had urinary retention, failed voiding trial and was discharged to [facility] with indwelling Foley catheter.  He also developed new onset A. fib and was started on Eliquis. Patient presented to the ED on 11/21/2019 after an episode of bright red blood and clots per rectum.  In the ED, he had a second witnessed episode of hematochezia with clots.   Hemodynamically stable in the ED. EKG showed normal sinus rhythm. Lab with hemoglobin of 15, lactic acid 1.3 and subsequently increased to 2.2. CT abdomen pelvis with contrast was obtained which did not show any definite source of bleeding.  Postsurgical changes noted in the perineum without evidence of infection. Patient was admitted to hospitalist service for lower GI bleeding.  Subjective: Patient was seen and examined this morning. Pleasant elderly Caucasian male.  Lying down in bed.  Not in distress.  He was having memory lapses.  Says he has short-term memory deficit.  Chart reviewed. Afebrile, heart in normal sinus rhythm, blood pressure stable. Repeat hemoglobin this morning 13.4.  Assessment/Plan: Acute on chronic GI bleeding. -Presented with abrupt onset painless hematochezia. -Patient states that he normally has small amount of blood in his stool which he attributes to hemorrhoids.  However for the previous 24 hours prior to presentation, he had multiple episodes of large amount of bright red blood with clots without abdominal pain. -Patient has been on Eliquis for last 1-2 weeks for A. Fib. -This morning, patient also had a large  amount of maroon-colored stool with clots, also had hematuria.  GI consult appreciated.  Diverticular bleed suspected.  Pending CT angio abdomen pelvis. -Eliquis on hold.  Paroxysmal A. Fib -Newly diagnosed on last admission and was started on Eliquis for elevated chads vas score. -Currently on normal sinus rhythm.  Eliquis on hold. -Continue telemetry monitoring.  Chronic diastolic CHF Essential hypertension -last echo 9/'21 with EF 60-65% and grade II diastolic dysfxn.  -Currently no evidence of decompensation.   -Blood pressure stable -Continue bisoprolol.  Keep Lasix on hold while having active GI bleeding. -Continue statin.   Recent perineal surgery -Patient underwent incision and drainage of a high perirectal abscess on 11/10/2019 by Dr. Johney Maine. He was noted to have hemorrhoids without thrombosis at that point. He was discharged on Keflex and Flagyl for 10 days and fluconazole.  Patient reportedly completed the course.  Diabetes mellitus type 2  -A1c 9.1% on 10/26/2019  -Seems to be on Jardiance only at home.  Continue the same.   -Continue sliding scale insulin with Accu-Cheks.    ? Dementia -continue aricept  BPH and recent acute urinary retention  -Currently has indwelling Foley catheter since last hospitalization.   -Continue Flomax.  Obstructive sleep apnea -Continue nocturnal BiPAP.  Home meds include  Mobility: Encourage ambulation Code Status:   Code Status: Full Code  Nutritional status: Body mass index is 36.4 kg/m.     Diet Order            Diet clear liquid Room service appropriate? Yes; Fluid consistency: Thin  Diet effective now  DVT prophylaxis: SCDs Start: 11/22/19 0326   Antimicrobials:  None Fluid: None Consultants: [facility] Communication:  None at bedside  Status is: Inpatient  Remains inpatient appropriate because:Ongoing diagnostic testing needed not appropriate for outpatient work up and IV treatments  appropriate due to intensity of illness or inability to take PO   Dispo: The patient is from: SNF              Anticipated d/c is to: SNF              Anticipated d/c date is: 1 day              Patient currently is not medically stable to d/c.       Infusions:  . sodium chloride 10 mL/hr at 11/22/19 0522    Scheduled Meds: . allopurinol  300 mg Oral Daily  . atorvastatin  40 mg Oral Daily  . bisoprolol  2.5 mg Oral Daily  . cephALEXin  1,000 mg Oral TID  . cholecalciferol  2,000 Units Oral Daily  . donepezil  10 mg Oral QHS  . empagliflozin  10 mg Oral QAC breakfast  . fluticasone  2 spray Each Nare Daily  . furosemide  20 mg Oral Daily  . insulin aspart  0-20 Units Subcutaneous TID WC  . loratadine  10 mg Oral Daily  . metroNIDAZOLE  500 mg Oral Q8H  . pantoprazole  80 mg Oral Daily  . potassium chloride  10 mEq Oral Daily  . sertraline  50 mg Oral Daily  . sodium chloride (PF)      . tamsulosin  0.4 mg Oral QPC supper    Antimicrobials: Anti-infectives (From admission, onward)   Start     Dose/Rate Route Frequency Ordered Stop   11/22/19 1000  cephALEXin (KEFLEX) capsule 1,000 mg        1,000 mg Oral 3 times daily 11/22/19 0324     11/22/19 0600  metroNIDAZOLE (FLAGYL) tablet 500 mg        500 mg Oral Every 8 hours 11/22/19 0324        PRN meds: acetaminophen **OR** acetaminophen, albuterol, tiZANidine   Objective: Vitals:   11/22/19 0730 11/22/19 0800  BP: 127/67 131/73  Pulse: 67 93  Resp: 14 (!) 23  Temp:    SpO2: 90% 93%    Intake/Output Summary (Last 24 hours) at 11/22/2019 0915 Last data filed at 11/22/2019 0704 Gross per 24 hour  Intake --  Output 251 ml  Net -251 ml   Filed Weights   11/21/19 2309  Weight: 118.4 kg   Weight change:  Body mass index is 36.4 kg/m.   Physical Exam: General exam: Appears calm and comfortable.  Not in physical distress Skin: No rashes, lesions or ulcers. HEENT: Atraumatic, normocephalic, supple neck,  no obvious bleeding Lungs: Clear to auscultation bilaterally CVS: Regular rate and rhythm, no murmur GI/Abd soft, distended from obesity, bowel sound present CNS: Alert, awake monitor x3 Psychiatry: Mood appropriate Extremities: No pedal edema, no calf tenderness  Data Review: I have personally reviewed the laboratory data and studies available.  Recent Labs  Lab 11/21/19 2255 11/22/19 0500  WBC 7.3  --   NEUTROABS 5.4  --   HGB 15.0 13.4  HCT 45.2  --   MCV 94.2  --   PLT 254  --    Recent Labs  Lab 11/21/19 2255  NA 139  K 3.2*  CL 103  CO2 24  GLUCOSE 142*  BUN 7*  CREATININE 0.98  CALCIUM 8.8*    F/u labs ordered  Signed, Terrilee Croak, MD [facility] 11/22/2019

## 2019-11-22 NOTE — ED Notes (Signed)
Date and time results received: 11/22/19 0244 (use smartphrase ".now" to insert current time)  Test: lactic acid  Critical Value: 2.2  Name of Provider Notified: Mesner, MD   Orders Received? Or Actions Taken?: Orders Received - See Orders for details

## 2019-11-22 NOTE — Plan of Care (Signed)

## 2019-11-22 NOTE — ED Notes (Signed)
With NT assistance, changed brief, chucks, and sheet. Pt had passed large blot clots rectally.

## 2019-11-23 ENCOUNTER — Ambulatory Visit (HOSPITAL_COMMUNITY): Payer: Medicare PPO | Admitting: Physician Assistant

## 2019-11-23 DIAGNOSIS — M170 Bilateral primary osteoarthritis of knee: Secondary | ICD-10-CM | POA: Insufficient documentation

## 2019-11-23 DIAGNOSIS — K921 Melena: Secondary | ICD-10-CM | POA: Diagnosis not present

## 2019-11-23 DIAGNOSIS — Z7901 Long term (current) use of anticoagulants: Secondary | ICD-10-CM

## 2019-11-23 DIAGNOSIS — I4891 Unspecified atrial fibrillation: Secondary | ICD-10-CM | POA: Insufficient documentation

## 2019-11-23 DIAGNOSIS — L7622 Postprocedural hemorrhage and hematoma of skin and subcutaneous tissue following other procedure: Principal | ICD-10-CM

## 2019-11-23 DIAGNOSIS — M1A09X0 Idiopathic chronic gout, multiple sites, without tophus (tophi): Secondary | ICD-10-CM | POA: Insufficient documentation

## 2019-11-23 DIAGNOSIS — R5381 Other malaise: Secondary | ICD-10-CM | POA: Insufficient documentation

## 2019-11-23 DIAGNOSIS — D6869 Other thrombophilia: Secondary | ICD-10-CM | POA: Insufficient documentation

## 2019-11-23 LAB — BASIC METABOLIC PANEL
Anion gap: 12 (ref 5–15)
BUN: 6 mg/dL — ABNORMAL LOW (ref 8–23)
CO2: 22 mmol/L (ref 22–32)
Calcium: 8.3 mg/dL — ABNORMAL LOW (ref 8.9–10.3)
Chloride: 104 mmol/L (ref 98–111)
Creatinine, Ser: 0.85 mg/dL (ref 0.61–1.24)
GFR, Estimated: >60 mL/min (ref >60)
Glucose, Bld: 115 mg/dL — ABNORMAL HIGH (ref 70–99)
Potassium: 4.4 mmol/L (ref 3.5–5.1)
Sodium: 138 mmol/L (ref 135–145)

## 2019-11-23 LAB — HEMOGLOBIN
Hemoglobin: 13.3 g/dL (ref 13.0–17.0)
Hemoglobin: 15 g/dL (ref 13.0–17.0)
Hemoglobin: 12.8 g/dL — ABNORMAL LOW (ref 13.0–17.0)

## 2019-11-23 LAB — CBC WITH DIFFERENTIAL/PLATELET
Abs Immature Granulocytes: 0.05 10*3/uL (ref 0.00–0.07)
Basophils Absolute: 0.1 10*3/uL (ref 0.0–0.1)
Basophils Relative: 1 %
Eosinophils Absolute: 0.3 10*3/uL (ref 0.0–0.5)
Eosinophils Relative: 4 %
HCT: 38.7 % — ABNORMAL LOW (ref 39.0–52.0)
Hemoglobin: 12.7 g/dL — ABNORMAL LOW (ref 13.0–17.0)
Immature Granulocytes: 1 %
Lymphocytes Relative: 14 %
Lymphs Abs: 0.9 10*3/uL (ref 0.7–4.0)
MCH: 31.8 pg (ref 26.0–34.0)
MCHC: 32.8 g/dL (ref 30.0–36.0)
MCV: 97 fL (ref 80.0–100.0)
Monocytes Absolute: 0.5 10*3/uL (ref 0.1–1.0)
Monocytes Relative: 8 %
Neutro Abs: 4.7 10*3/uL (ref 1.7–7.7)
Neutrophils Relative %: 72 %
Platelets: 242 10*3/uL (ref 150–400)
RBC: 3.99 MIL/uL — ABNORMAL LOW (ref 4.22–5.81)
RDW: 17.2 % — ABNORMAL HIGH (ref 11.5–15.5)
WBC: 6.5 10*3/uL (ref 4.0–10.5)
nRBC: 0 % (ref 0.0–0.2)

## 2019-11-23 LAB — LACTIC ACID, PLASMA: Lactic Acid, Venous: 1.1 mmol/L (ref 0.5–1.9)

## 2019-11-23 LAB — GLUCOSE, CAPILLARY
Glucose-Capillary: 103 mg/dL — ABNORMAL HIGH (ref 70–99)
Glucose-Capillary: 107 mg/dL — ABNORMAL HIGH (ref 70–99)
Glucose-Capillary: 136 mg/dL — ABNORMAL HIGH (ref 70–99)
Glucose-Capillary: 125 mg/dL — ABNORMAL HIGH (ref 70–99)

## 2019-11-23 MED ORDER — DOCUSATE SODIUM 100 MG PO CAPS
100.0000 mg | ORAL_CAPSULE | Freq: Two times a day (BID) | ORAL | Status: DC
  Administered 2019-11-23 – 2019-11-25 (×4): 100 mg via ORAL
  Filled 2019-11-23 (×5): qty 1

## 2019-11-23 MED ORDER — POLYETHYLENE GLYCOL 3350 17 G PO PACK: 17.0000 g | PACK | Freq: Every day | ORAL | Status: DC | PRN

## 2019-11-23 NOTE — TOC Initial Note (Signed)
Transition of Care [facility]) - Initial/Assessment Note    Patient Details  Name: Gary Butler MRN: [id number] Date of Birth: November 25, 1945  [facility]) CM/SW Contact:    Ross Ludwig, LCSW Phone Number: 11/23/2019, 6:04 PM  Clinical Narrative:                  Patient is a 74 year old male who is alert and oriented x4.  Patient is married and lives with his wife.  Patient has been at [facility] receiving rehab.  Anticipated plan is for patient to return back to SNF to continue with his rehab pending insurance authorization and bed availability.  Patient was sleeping, CSW completed assessment by reviewing chart.  CSW to continue to facilitate discharge planning.  Expected Discharge Plan: Skilled Nursing Facility Barriers to Discharge: Continued Medical Work up, Ship broker   Patient Goals and CMS Choice Patient states their goals for this hospitalization and ongoing recovery are:: To return to [facility] for rehab, then return back home. CMS Medicare.gov Compare Post Acute Care list provided to:: Other (Comment Required) (Reviewed Medical Record)    Expected Discharge Plan and Services Expected Discharge Plan: [place] Choice: [place] Living arrangements for the past 2 months: [place], [place]                                      Prior Living Arrangements/Services Living arrangements for the past 2 months: [place], [place] Lives with:: Spouse Patient language and need for interpreter reviewed:: Yes Do you feel safe going back to the place where you live?: No   Patient needs rehab before he is able to return back home.  Need for Family Participation in Patient Care: No (Comment) Care giver support system in place?: No (comment)   Criminal Activity/Legal Involvement Pertinent to Current Situation/Hospitalization: No - Comment as  needed  Activities of Daily Living Home Assistive Devices/Equipment: Walker (specify type) ADL Screening (condition at time of admission) Patient's cognitive ability adequate to safely complete daily activities?: No Is the patient deaf or have difficulty hearing?: Yes Does the patient have difficulty seeing, even when wearing glasses/contacts?: No Does the patient have difficulty concentrating, remembering, or making decisions?: No Patient able to express need for assistance with ADLs?: Yes Does the patient have difficulty dressing or bathing?: Yes Independently performs ADLs?: No Communication: Independent Dressing (OT): Needs assistance Grooming: Independent Feeding: Independent Bathing: Needs assistance Toileting: Needs assistance In/Out Bed: Needs assistance Walks in Home: Needs assistance Does the patient have difficulty walking or climbing stairs?: Yes Weakness of Legs: Both Weakness of Arms/Hands: None  Permission Sought/Granted Permission sought to share information with : Facility Sport and exercise psychologist, Family Supports Permission granted to share information with : Yes, Verbal Permission Granted  Share Information with NAME: Worthington,Susan Spouse [phone]  [phone]           Emotional Assessment Appearance:: Appears stated age     Orientation: : Oriented to Self, Oriented to Place, Oriented to  Time, Oriented to Situation Alcohol / Substance Use: Not Applicable Psych Involvement: No (comment)  Admission diagnosis:  Acute GI bleeding [K92.2] Lower GI bleed [K92.2] Patient Active Problem List   Diagnosis Date Noted  . Bilateral primary osteoarthritis of knee 11/23/2019  . Hypercoagulable state due to atrial fibrillation ([place]) 11/23/2019  .  Physical deconditioning 11/23/2019  . Chronic gout of multiple sites 11/23/2019  . Hematochezia 11/22/2019  . Lower GI bleed 11/22/2019  . Diverticulosis   . Acute urinary retention s/p Foley 11/10/2019 11/10/2019  .  Suprasphincteric perirectal abscess s/p I&D 11/10/2019 11/10/2019  . Diarrhea 11/10/2019  . Perineal abscess 11/10/2019  . GERD without esophagitis 11/10/2019  . Atrial fibrillation ([place]) 11/10/2019  . Poorly controlled diabetes mellitus ([place]) 11/09/2019  . Dyspnea on exertion 02/19/2017  . Bilateral lower extremity edema 07/06/2015  . CHF (congestive heart failure) ([place]) 06/27/2015  . Fungal dermatitis 06/27/2015  . Edema 06/20/2015  . Increasing shortness of breath 06/20/2015  . Lumbar radiculopathy, acute 03/09/2015  . Essential hypertension 12/27/2014  . Uncontrolled type 2 diabetes mellitus with hyperglycemia, with long-term current use of insulin ([place]) 12/27/2014  . Mild aortic stenosis 06/28/2014  . Memory loss 04/29/2014  . Tinnitus of both ears 02/17/2014  . Wellness examination 12/04/2013  . Constipation due to slow transit 08/18/2013  . S/P left TKA 06/22/2013  . Meniere disease 04/21/2013  . Benign paroxysmal positional vertigo 04/21/2013  . History of methicillin resistant staphylococcus aureus (MRSA) 2015  . Psoriatic arthritis ([employer]) 05/14/2012  . General medical exam 01/28/2012  . Cellulitis of eyelid 09/25/2011  . Diverticulitis 06/20/2011  . Erythrocytosis 05/18/2011  . S/P TKR (total knee replacement) 12/31/2010  . Obesity, Class III, BMI 40-49.9 (morbid obesity) ([place]) 12/31/2010  . Hyperlipidemia 12/22/2010  . BPH (benign prostatic hyperplasia) 12/22/2010  . Gout 12/22/2010  . Obstructive sleep apnea 04/04/2009   PCP:  [name], MD Pharmacy:   [facility] [place], [address] [place] [address] Phone: [phone] Fax: [phone]     Social Determinants of Health (SDOH) Interventions    Readmission Risk Interventions No flowsheet data found.

## 2019-11-23 NOTE — Progress Notes (Signed)
[facility] Progress Note     Subjective: Patient returned to the hospital from SNF with working dx of rectal bleeding. GI was consulted and examined patient and determined bleeding was actually coming from perineal incision from prior I&D site. Surgery asked to reevaluate.   Objective: Vital signs in last 24 hours: Temp:  [97.4 F (36.3 C)-98.1 F (36.7 C)] 97.5 F (36.4 C) (10/11 1245) Pulse Rate:  [63-76] 64 (10/11 1245) Resp:  [14-21] 20 (10/11 1245) BP: (105-120)/(57-70) 109/57 (10/11 1245) SpO2:  [91 %-98 %] 98 % (10/11 1245) Weight:  [132 kg] 132 kg (10/11 1053) Last BM Date: 11/22/19  Intake/Output from previous day: 10/10 0701 - 10/11 0700 In: 300 [P.O.:300] Out: 1401 [Urine:1400; Stool:1] Intake/Output this shift: Total I/O In: 121.2 [I.V.:121.2] Out: -   PE: General: pleasant, WD, obese, chronically ill appearing male who is laying in bed in NAD Abd: soft, NT, ND GU: perirectal incision clean with small amount oozing from wound surfaces, no arterial bleeding or large volume blood loss noted, no purulence   Lab Results:  Recent Labs    11/21/19 2255 11/22/19 0500 11/23/19 0056 11/23/19 0703  WBC 7.3  --  6.5  --   HGB 15.0   < > 12.7* 13.3  HCT 45.2  --  38.7*  --   PLT 254  --  242  --    < > = values in this interval not displayed.   BMET Recent Labs    11/22/19 1413 11/23/19 0056  NA 138 138  K 3.2* 4.4  CL 106 104  CO2 23 22  GLUCOSE 145* 115*  BUN 6* 6*  CREATININE 0.90 0.85  CALCIUM 8.2* 8.3*   PT/INR Recent Labs    11/22/19 0135  LABPROT 16.0*  INR 1.3*   CMP     Component Value Date/Time   NA 138 11/23/2019 0056   NA 139 06/17/2013 0946   K 4.4 11/23/2019 0056   K 3.5 06/17/2013 0946   CL 104 11/23/2019 0056   CL 102 05/14/2012 0856   CO2 22 11/23/2019 0056   CO2 27 06/17/2013 0946   GLUCOSE 115 (H) 11/23/2019 0056   GLUCOSE 184 (H) 06/17/2013 0946   GLUCOSE 135 (H) 05/14/2012 0856   BUN 6 (L) 11/23/2019  0056   BUN 13.8 06/17/2013 0946   CREATININE 0.85 11/23/2019 0056   CREATININE 0.95 07/14/2015 1019   CREATININE 1.1 06/17/2013 0946   CALCIUM 8.3 (L) 11/23/2019 0056   CALCIUM 9.3 06/17/2013 0946   PROT 5.3 (L) 11/22/2019 1413   PROT 5.7 (L) 06/17/2013 0946   ALBUMIN 2.6 (L) 11/22/2019 1413   ALBUMIN 3.2 (L) 06/17/2013 0946   AST 20 11/22/2019 1413   AST 19 06/17/2013 0946   ALT 12 11/22/2019 1413   ALT 18 06/17/2013 0946   ALKPHOS 53 11/22/2019 1413   ALKPHOS 101 06/17/2013 0946   BILITOT 0.9 11/22/2019 1413   BILITOT 1.54 (H) 06/17/2013 0946   GFRNONAA >60 11/23/2019 0056   GFRAA >60 11/13/2019 0549   Lipase     Component Value Date/Time   LIPASE 22 11/09/2019 1833       Studies/Results: CT ABDOMEN PELVIS W CONTRAST  Result Date: 11/22/2019 CLINICAL DATA:  Chronic anticoagulation, rectal bleeding EXAM: CT ABDOMEN AND PELVIS WITH CONTRAST TECHNIQUE: Multidetector CT imaging of the abdomen and pelvis was performed using the standard protocol following bolus administration of intravenous contrast. CONTRAST:  152mL OMNIPAQUE IOHEXOL 300 MG/ML  SOLN COMPARISON:  11/09/2019  FINDINGS: Lower chest: Tiny bilateral pleural effusions are present with associated mild bibasilar compressive atelectasis. Extensive calcification of the aortic valve leaflets. Global cardiac size within normal limits. Hepatobiliary: Cholelithiasis without pericholecystic inflammatory change. Mild hepatic steatosis. No intra or extrahepatic biliary ductal dilation. Pancreas: Unremarkable Spleen: Unremarkable Adrenals/Urinary Tract: Moderate bilateral renal cortical atrophy. Multiple simple cortical cysts are seen within the kidneys bilaterally. No hydronephrosis. No intrarenal or ureteral calculi. The adrenal glands are unremarkable. The bladder is decompressed with a Foley catheter balloon seen within its lumen. Stomach/Bowel: Stomach, small bowel, and large bowel are unremarkable save for a few scattered  descending and sigmoid colonic diverticula. The appendix is normal. No free intraperitoneal gas or fluid. Vascular/Lymphatic: Mild aortoiliac atherosclerotic calcification. No aortic aneurysm. The abdominal vasculature is otherwise unremarkable. No pathologic adenopathy within the abdomen and pelvis. Reproductive: Prostate is unremarkable. Other: Extensive subcutaneous gas noted within the left perineum and gluteal folds has improved in the interval since the prior examination. Mild residual inflammatory subcutaneous infiltration is seen within this region, best appreciated on axial image # 102/2. No discrete drainable fluid collection. Rectum unremarkable. Musculoskeletal: No lytic or blastic bone lesion is seen. IMPRESSION: No definite source identified on this non arteriographic study for the patient's reported gastrointestinal hemorrhage. Improved subcutaneous gas within the perineum and left gluteal crease. Interval Foley decompression of the bladder. Additional incidental findings as noted above. Aortic Atherosclerosis (ICD10-I70.0). Electronically Signed   By: Fidela Salisbury MD   On: 11/22/2019 01:16   CT Angio Abd/Pel w/ and/or w/o  Result Date: 11/22/2019 CLINICAL DATA:  GI bleed, rectal bleeding, hematuria.  On Eliquis EXAM: CTA ABDOMEN AND PELVIS WITHOUT AND WITH CONTRAST TECHNIQUE: Multidetector CT imaging of the abdomen and pelvis was performed using the standard protocol during bolus administration of intravenous contrast. Multiplanar reconstructed images and MIPs were obtained and reviewed to evaluate the vascular anatomy. CONTRAST:  161mL OMNIPAQUE IOHEXOL 350 MG/ML SOLN COMPARISON:  Diagnostic CT of the same day, and earlier studies FINDINGS: VASCULAR Aorta: Mild scattered calcified atheromatous plaque. No aneurysm, dissection, or stenosis. Celiac: Mild short-segment narrowing at the level of the median arcuate ligament of the diaphragm, widely patent distally. SMA: Patent without evidence of  aneurysm, dissection, vasculitis or significant stenosis. Renals: Both renal arteries are patent without evidence of aneurysm, dissection, vasculitis, fibromuscular dysplasia or significant stenosis. IMA: Patent without evidence of aneurysm, dissection, vasculitis or significant stenosis. Inflow: Minimal scattered calcified plaque. No aneurysm, dissection, or stenosis. Proximal Outflow: Bilateral common femoral and visualized portions of the superficial and profunda femoral arteries are patent without evidence of aneurysm, dissection, vasculitis or significant stenosis. Veins: Patent hepatic veins, portal vein, SMV, splenic vein, bilateral renal veins. Iliac venous system and IVC unremarkable. No venous pathology evident. Review of the MIP images confirms the above findings. NON-VASCULAR Lower chest: Small bilateral pleural effusions as before. Coarse calcifications on aortic valve leaflets. Hepatobiliary: Innumerable subcentimeter partially calcified stones layer in the dependent aspect of the nondilated gallbladder. No focal liver lesion. Pancreas: Unremarkable. No pancreatic ductal dilatation or surrounding inflammatory changes. Spleen: Normal in size without focal abnormality. Adrenals/Urinary Tract: Adrenal glands unremarkable. Renal cysts as before. No hydronephrosis. Urinary bladder decompressed by Foley catheter. Stomach/Bowel: Stomach is nondistended. Small bowel decompressed. Normal appendix. The colon is nondilated with a few scattered descending and sigmoid diverticula. No evidence of active extravasation into the bowel lumen. Lymphatic: No abdominal or pelvic adenopathy. Reproductive: Prostate is unremarkable. Other: No ascites.  Right pelvic phlebolith.  No free air. Musculoskeletal: Small  periumbilical hernia containing only mesenteric fat. Anterior vertebral endplate spurring at multiple levels in the lower thoracic spine. Multilevel lumbar spondylitic change. IMPRESSION: 1. No evidence of active  extravasation into the bowel lumen. 2. Descending and sigmoid diverticulosis. 3. Small bilateral pleural effusions as before. 4. Cholelithiasis. 5. Small periumbilical hernia containing only mesenteric fat. Aortic Atherosclerosis (ICD10-I70.0). Electronically Signed   By: Lucrezia Europe M.D.   On: 11/22/2019 15:32    Anti-infectives: Anti-infectives (From admission, onward)   Start     Dose/Rate Route Frequency Ordered Stop   11/22/19 1000  cephALEXin (KEFLEX) capsule 1,000 mg  Status:  Discontinued        1,000 mg Oral 3 times daily 11/22/19 0324 11/22/19 1510   11/22/19 0600  metroNIDAZOLE (FLAGYL) tablet 500 mg        500 mg Oral Every 8 hours 11/22/19 0324         Assessment/Plan OSA Morbid obesity HTN T2DM Psoriatic arthritis  Urinary retention with indwelling foley A. Fib on eliquis  S/p I&D of suprasphincteric perirectal abscess 9/28 Dr. Johney Maine - called to re-examine wound for bleeding - small amount oozing, wound was packed with single 4x4 and appeared to achieve good hemostasis - would recommend leaving packing until tomorrow and then moistening prior to removing - if any continued or future bleeding, can repack with moistened or dry gauze and change daily or PRN - hold eliquis for 48 hrs  - hgb stable at 13 - will put follow up in chart - recommend stool softeners and miralax prn    LOS: 1 day    Norm Parcel , [facility] 11/23/2019, 12:48 PM Please see Amion for pager number during day hours 7:00am-4:30pm

## 2019-11-23 NOTE — Progress Notes (Addendum)
Progress Note  Chief Complaint:    Rectal bleeding     ASSESSMENT / PLAN:    # Painless hematochezia on Eliquis --CTA abd/pelvix negative for bleeding --Hgb has remained stable ~ 13.3 over last 24 hours.  --No bleeding in 24 hours will give advance diet --Eliquis is still on hold  Addendum: Nurse notified me that patient had another episode of rectal bleeding. On exam he is bleeding from previous I&D site in perineal area. Brown stool on digital rectal exam. Will notify Surgery.     # On isolation precautions.  --He was explosive diarrhea for ~ 8 weeks until being admitted, now none since admission. C-diff negative on 10/27/19. No evidence for bowel inflammation on recent CTA.  Apparently outside stool studies showed elevated fecal calprotectin and lactoferrin ( might that have been related to the recent perirectal abscess? ) .  --If has recurrent diarrhea we can continue workup as outpatient.  .     # Reported weight loss. He was did lose ~ 50 pounds towards the beginning of the year but weight seems to have since been stable since at ~ 300 pounds . However, need to clarify current weight 3 days ago he was 261 pounds but the day prior 302 pounds   --will ask staff to reweigh I think the 261 was an error       SUBJECTIVE:   No bleeding, no BMs. Feels okay, Wants to advance diet    OBJECTIVE:    Scheduled inpatient medications:  . allopurinol  300 mg Oral Daily  . atorvastatin  40 mg Oral Daily  . bisoprolol  2.5 mg Oral Daily  . Chlorhexidine Gluconate Cloth  6 each Topical Daily  . cholecalciferol  2,000 Units Oral Daily  . donepezil  10 mg Oral QHS  . empagliflozin  10 mg Oral QAC breakfast  . fluticasone  2 spray Each Nare Daily  . insulin aspart  0-20 Units Subcutaneous TID WC  . loratadine  10 mg Oral Daily  . metroNIDAZOLE  500 mg Oral Q8H  . pantoprazole  80 mg Oral Daily  . potassium chloride  10 mEq Oral Daily  . sertraline  50 mg Oral Daily  .  tamsulosin  0.4 mg Oral QPC supper   Continuous inpatient infusions:  . sodium chloride 10 mL/hr at 11/22/19 1412   PRN inpatient medications: acetaminophen **OR** acetaminophen, albuterol, tiZANidine  Vital signs in last 24 hours: Temp:  [97.4 F (36.3 C)-98.1 F (36.7 C)] 97.4 F (36.3 C) (10/11 0800) Pulse Rate:  [63-76] 70 (10/11 0800) Resp:  [12-21] 18 (10/11 0800) BP: (105-127)/(60-73) 112/65 (10/11 0800) SpO2:  [91 %-98 %] 96 % (10/11 0800) Last BM Date: 11/22/19  Intake/Output Summary (Last 24 hours) at 11/23/2019 0936 Last data filed at 11/23/2019 0500 Gross per 24 hour  Intake 300 ml  Output 1400 ml  Net -1100 ml     Physical Exam:  . General: Alert, male in NAD . Heart:  Regular rate. No murmur. No lower extremity edema . Pulmonary: Normal respiratory effort . Abdomen: Soft, nondistended, Nontender. Normal bowel sounds. No masses felt. . Neurologic: Alert and oriented . Psych: Pleasant. Cooperative.   Filed Weights   11/21/19 2309  Weight: 118.4 kg    Intake/Output from previous day: 10/10 0701 - 10/11 0700 In: 300 [P.O.:300] Out: 1401 [Urine:1400; Stool:1] Intake/Output this shift: No intake/output data recorded.    Lab Results: Recent Labs    11/21/19 2255 11/22/19 0500  11/22/19 2117 11/23/19 0056 11/23/19 0703  WBC 7.3  --   --  6.5  --   HGB 15.0   < > 12.9* 12.7* 13.3  HCT 45.2  --   --  38.7*  --   PLT 254  --   --  242  --    < > = values in this interval not displayed.   BMET Recent Labs    11/21/19 2255 11/22/19 1413 11/23/19 0056  NA 139 138 138  K 3.2* 3.2* 4.4  CL 103 106 104  CO2 24 23 22   GLUCOSE 142* 145* 115*  BUN 7* 6* 6*  CREATININE 0.98 0.90 0.85  CALCIUM 8.8* 8.2* 8.3*   LFT Recent Labs    11/22/19 1413  PROT 5.3*  ALBUMIN 2.6*  AST 20  ALT 12  ALKPHOS 53  BILITOT 0.9   PT/INR Recent Labs    11/22/19 0135  LABPROT 16.0*  INR 1.3*   Hepatitis Panel No results for input(s): HEPBSAG, HCVAB,  HEPAIGM, HEPBIGM in the last 72 hours.  CT ABDOMEN PELVIS W CONTRAST  Result Date: 11/22/2019 CLINICAL DATA:  Chronic anticoagulation, rectal bleeding EXAM: CT ABDOMEN AND PELVIS WITH CONTRAST TECHNIQUE: Multidetector CT imaging of the abdomen and pelvis was performed using the standard protocol following bolus administration of intravenous contrast. CONTRAST:  143mL OMNIPAQUE IOHEXOL 300 MG/ML  SOLN COMPARISON:  11/09/2019 FINDINGS: Lower chest: Tiny bilateral pleural effusions are present with associated mild bibasilar compressive atelectasis. Extensive calcification of the aortic valve leaflets. Global cardiac size within normal limits. Hepatobiliary: Cholelithiasis without pericholecystic inflammatory change. Mild hepatic steatosis. No intra or extrahepatic biliary ductal dilation. Pancreas: Unremarkable Spleen: Unremarkable Adrenals/Urinary Tract: Moderate bilateral renal cortical atrophy. Multiple simple cortical cysts are seen within the kidneys bilaterally. No hydronephrosis. No intrarenal or ureteral calculi. The adrenal glands are unremarkable. The bladder is decompressed with a Foley catheter balloon seen within its lumen. Stomach/Bowel: Stomach, small bowel, and large bowel are unremarkable save for a few scattered descending and sigmoid colonic diverticula. The appendix is normal. No free intraperitoneal gas or fluid. Vascular/Lymphatic: Mild aortoiliac atherosclerotic calcification. No aortic aneurysm. The abdominal vasculature is otherwise unremarkable. No pathologic adenopathy within the abdomen and pelvis. Reproductive: Prostate is unremarkable. Other: Extensive subcutaneous gas noted within the left perineum and gluteal folds has improved in the interval since the prior examination. Mild residual inflammatory subcutaneous infiltration is seen within this region, best appreciated on axial image # 102/2. No discrete drainable fluid collection. Rectum unremarkable. Musculoskeletal: No lytic or  blastic bone lesion is seen. IMPRESSION: No definite source identified on this non arteriographic study for the patient's reported gastrointestinal hemorrhage. Improved subcutaneous gas within the perineum and left gluteal crease. Interval Foley decompression of the bladder. Additional incidental findings as noted above. Aortic Atherosclerosis (ICD10-I70.0). Electronically Signed   By: Fidela Salisbury MD   On: 11/22/2019 01:16   CT Angio Abd/Pel w/ and/or w/o  Result Date: 11/22/2019 CLINICAL DATA:  GI bleed, rectal bleeding, hematuria.  On Eliquis EXAM: CTA ABDOMEN AND PELVIS WITHOUT AND WITH CONTRAST TECHNIQUE: Multidetector CT imaging of the abdomen and pelvis was performed using the standard protocol during bolus administration of intravenous contrast. Multiplanar reconstructed images and MIPs were obtained and reviewed to evaluate the vascular anatomy. CONTRAST:  168mL OMNIPAQUE IOHEXOL 350 MG/ML SOLN COMPARISON:  Diagnostic CT of the same day, and earlier studies FINDINGS: VASCULAR Aorta: Mild scattered calcified atheromatous plaque. No aneurysm, dissection, or stenosis. Celiac: Mild short-segment narrowing at the level  of the median arcuate ligament of the diaphragm, widely patent distally. SMA: Patent without evidence of aneurysm, dissection, vasculitis or significant stenosis. Renals: Both renal arteries are patent without evidence of aneurysm, dissection, vasculitis, fibromuscular dysplasia or significant stenosis. IMA: Patent without evidence of aneurysm, dissection, vasculitis or significant stenosis. Inflow: Minimal scattered calcified plaque. No aneurysm, dissection, or stenosis. Proximal Outflow: Bilateral common femoral and visualized portions of the superficial and profunda femoral arteries are patent without evidence of aneurysm, dissection, vasculitis or significant stenosis. Veins: Patent hepatic veins, portal vein, SMV, splenic vein, bilateral renal veins. Iliac venous system and IVC  unremarkable. No venous pathology evident. Review of the MIP images confirms the above findings. NON-VASCULAR Lower chest: Small bilateral pleural effusions as before. Coarse calcifications on aortic valve leaflets. Hepatobiliary: Innumerable subcentimeter partially calcified stones layer in the dependent aspect of the nondilated gallbladder. No focal liver lesion. Pancreas: Unremarkable. No pancreatic ductal dilatation or surrounding inflammatory changes. Spleen: Normal in size without focal abnormality. Adrenals/Urinary Tract: Adrenal glands unremarkable. Renal cysts as before. No hydronephrosis. Urinary bladder decompressed by Foley catheter. Stomach/Bowel: Stomach is nondistended. Small bowel decompressed. Normal appendix. The colon is nondilated with a few scattered descending and sigmoid diverticula. No evidence of active extravasation into the bowel lumen. Lymphatic: No abdominal or pelvic adenopathy. Reproductive: Prostate is unremarkable. Other: No ascites.  Right pelvic phlebolith.  No free air. Musculoskeletal: Small periumbilical hernia containing only mesenteric fat. Anterior vertebral endplate spurring at multiple levels in the lower thoracic spine. Multilevel lumbar spondylitic change. IMPRESSION: 1. No evidence of active extravasation into the bowel lumen. 2. Descending and sigmoid diverticulosis. 3. Small bilateral pleural effusions as before. 4. Cholelithiasis. 5. Small periumbilical hernia containing only mesenteric fat. Aortic Atherosclerosis (ICD10-I70.0). Electronically Signed   By: Lucrezia Europe M.D.   On: 11/22/2019 15:32      Active Problems:   Obstructive sleep apnea   BPH (benign prostatic hyperplasia)   Essential hypertension   CHF (congestive heart failure) ([place])   Poorly controlled diabetes mellitus ([place])   Atrial fibrillation ([place])   Hematochezia   Lower GI bleed     LOS: 1 day   Tye Savoy ,NP 11/23/2019, 9:36 AM

## 2019-11-23 NOTE — Progress Notes (Signed)
PROGRESS NOTE  Gary Butler  DOB: 1945-08-01  PCP: [name], MD RJJ:[id number]  DOA: 11/21/2019  LOS: 1 day   Chief Complaint  Patient presents with  . Rectal Bleeding   Brief narrative: Gary Butler is a 74 y.o. male with PMH of obesity, sleep apnea, HTN, DM 2, psoriatic arthritis who was recently hospitalized 9/27-10/06/2019 for perirectal abscess requiring surgical I&D.  During that stay, he had urinary retention, failed voiding trial and was discharged to [facility] with indwelling Foley catheter.  He also developed new onset A. fib and was started on Eliquis. Patient presented to the ED on 11/21/2019 after an episode of bright red blood and clots per rectum.  In the ED, he had a second witnessed episode of hematochezia with clots.   Hemodynamically stable in the ED. EKG showed normal sinus rhythm. Lab with hemoglobin of 15, lactic acid 1.3 and subsequently increased to 2.2. CT abdomen pelvis with contrast was obtained which did not show any definite source of bleeding.  Postsurgical changes noted in the perineum without evidence of infection. Patient was admitted to hospitalist service for lower GI bleeding.  Subjective: Patient was seen and examined this morning. Patient did not have any new bowel movement by the time of my evaluation. Later this morning, patient had an episode of rectal bleeding.  Seen by GI.  Per GI, he was actually noted to be bleeding from previous I&D site in the perineal area.  On digital rectal exam, stool was noted to be brown color.  Patient was subsequently seen by general surgery. Per general surgery, perirectal incision site is clean and had only small amount of bruising from the wound surfaces.  Assessment/Plan: Acute on chronic GI bleeding. -Presented with abrupt onset painless hematochezia. -Patient states that he normally has small amount of blood in his stool which he attributes to hemorrhoids.  However for the 24 hours prior to  presentation, he had multiple episodes of large amount of bright red blood with clots without abdominal pain. -Patient has been on Eliquis for last 1-2 weeks for A. Fib. -Currently Eliquis is on hold.  Since admission, patient has had 2 episodes of maroon-colored stools.  GI consult obtained.  He had CT angio of abdomen pelvis which did not show any active bleeding/extravasation in the GI tract. -Hemoglobin stable and actually improved this morning but patient had another episode of rectal bleeding.  GI on board.  Bleeding from recent perineal surgery site -Patient underwent incision and drainage of a high perirectal abscess on 11/10/2019 by Dr. Johney Maine. He was noted to have hemorrhoids without thrombosis at that point. He was discharged on Keflex and Flagyl for 10 days and fluconazole.  Patient reportedly completed the course. -Wound site was evaluated by general surgery this morning. Per general surgery, perirectal incision site is clean and had only small amount of bruising from the wound surfaces.  Paroxysmal A. Fib -Newly diagnosed on last admission and was started on Eliquis for elevated chads vas score. -Currently on normal sinus rhythm.  Eliquis on hold. -Continue telemetry monitoring.  Chronic diastolic CHF Essential hypertension -last echo 9/'21 with EF 60-65% and grade II diastolic dysfxn.  -Currently no evidence of decompensation.   -Blood pressure stable -Continue bisoprolol.  Keep Lasix on hold while having active GI bleeding. -Continue statin.   Diabetes mellitus type 2  -A1c 9.1% on 10/26/2019  -Seems to be on Jardiance only at home.  Continue the same.   -Continue sliding scale insulin with  Accu-Cheks.   Recent Labs  Lab 11/22/19 1136 11/22/19 1624 11/22/19 2244 11/23/19 0755 11/23/19 1118  GLUCAP 131* 119* 114* 103* 107*   ? Dementia -continue aricept  BPH and recent acute urinary retention  -Currently has indwelling Foley catheter since last hospitalization.    -Continue Flomax.  Morbid obesity -Body mass index is 40.57 kg/m. Patient has been advised to make an attempt to improve diet and exercise patterns to aid in weight loss.  Obstructive sleep apnea -Continue nocturnal BiPAP.  Mobility: Encourage ambulation Code Status:   Code Status: Full Code  Nutritional status: Body mass index is 40.57 kg/m.     Diet Order            Diet Carb Modified Fluid consistency: Thin; Room service appropriate? Yes  Diet effective now                 DVT prophylaxis: SCDs Start: 11/22/19 0326   Antimicrobials:  None Fluid: None Consultants: [facility] Communication:  None at bedside  Status is: Inpatient  Remains inpatient appropriate because:Ongoing diagnostic testing needed not appropriate for outpatient work up and IV treatments appropriate due to intensity of illness or inability to take PO   Dispo: The patient is from: SNF              Anticipated d/c is to: SNF              Anticipated d/c date is: 1-2 days              Patient currently is not medically stable to d/c.   Infusions:  . sodium chloride 10 mL/hr at 11/22/19 1412    Scheduled Meds: . allopurinol  300 mg Oral Daily  . atorvastatin  40 mg Oral Daily  . bisoprolol  2.5 mg Oral Daily  . Chlorhexidine Gluconate Cloth  6 each Topical Daily  . cholecalciferol  2,000 Units Oral Daily  . docusate sodium  100 mg Oral BID  . donepezil  10 mg Oral QHS  . empagliflozin  10 mg Oral QAC breakfast  . fluticasone  2 spray Each Nare Daily  . insulin aspart  0-20 Units Subcutaneous TID WC  . loratadine  10 mg Oral Daily  . metroNIDAZOLE  500 mg Oral Q8H  . pantoprazole  80 mg Oral Daily  . potassium chloride  10 mEq Oral Daily  . sertraline  50 mg Oral Daily  . tamsulosin  0.4 mg Oral QPC supper    Antimicrobials: Anti-infectives (From admission, onward)   Start     Dose/Rate Route Frequency Ordered Stop   11/22/19 1000  cephALEXin (KEFLEX) capsule 1,000 mg   Status:  Discontinued        1,000 mg Oral 3 times daily 11/22/19 0324 11/22/19 1510   11/22/19 0600  metroNIDAZOLE (FLAGYL) tablet 500 mg        500 mg Oral Every 8 hours 11/22/19 0324        PRN meds: acetaminophen **OR** acetaminophen, albuterol, polyethylene glycol, tiZANidine   Objective: Vitals:   11/23/19 0800 11/23/19 1245  BP: 112/65 (!) 109/57  Pulse: 70 64  Resp: 18 20  Temp: (!) 97.4 F (36.3 C) (!) 97.5 F (36.4 C)  SpO2: 96% 98%    Intake/Output Summary (Last 24 hours) at 11/23/2019 1445 Last data filed at 11/23/2019 1000 Gross per 24 hour  Intake 421.22 ml  Output 800 ml  Net -378.78 ml   Filed Weights   11/21/19 2309  11/23/19 1053  Weight: 118.4 kg 132 kg   Weight change:  Body mass index is 40.57 kg/m.   Physical Exam: General exam: Morbidly obese.  Appears calm and comfortable.  Not in physical distress Skin: No rashes, lesions or ulcers. HEENT: Atraumatic, normocephalic, supple neck, no obvious bleeding Lungs: Clear to auscultation bilaterally CVS: Regular rate and rhythm, no murmur GI/Abd soft, distended from obesity, bowel sound present CNS: Alert, awake, oriented x3 Psychiatry: Mood appropriate Extremities: No pedal edema, no calf tenderness  Data Review: I have personally reviewed the laboratory data and studies available.  Recent Labs  Lab 11/21/19 2255 11/22/19 0500 11/22/19 1413 11/22/19 2117 11/23/19 0056 11/23/19 0703 11/23/19 1254  WBC 7.3  --   --   --  6.5  --   --   NEUTROABS 5.4  --   --   --  4.7  --   --   HGB 15.0   < > 13.2 12.9* 12.7* 13.3 15.0  HCT 45.2  --   --   --  38.7*  --   --   MCV 94.2  --   --   --  97.0  --   --   PLT 254  --   --   --  242  --   --    < > = values in this interval not displayed.   Recent Labs  Lab 11/21/19 2255 11/22/19 1413 11/23/19 0056  NA 139 138 138  K 3.2* 3.2* 4.4  CL 103 106 104  CO2 24 23 22   GLUCOSE 142* 145* 115*  BUN 7* 6* 6*  CREATININE 0.98 0.90 0.85  CALCIUM  8.8* 8.2* 8.3*    F/u labs ordered  Signed, Terrilee Croak, MD [facility] 11/23/2019

## 2019-11-23 NOTE — NC FL2 (Signed)
[place] LEVEL OF CARE SCREENING TOOL     IDENTIFICATION  Patient Name: Gary Butler Birthdate: 08-Nov-1945 Sex: male Admission Date (Current Location): 11/21/2019  [facility] and [place] Number:  Herbalist and Address:  [facility],  [place], [place]      Provider Number: [id number]  Attending Physician Name and Address:  Terrilee Croak, MD  Relative Name and Phone Number:  Jahseh, Lucchese [phone]  [phone]    Current Level of Care: Hospital Recommended Level of Care: [place] Prior Approval Number:    Date Approved/Denied:   PASRR Number: [id number]  Discharge Plan: SNF    Current Diagnoses: Patient Active Problem List   Diagnosis Date Noted  . Bilateral primary osteoarthritis of knee 11/23/2019  . Hypercoagulable state due to atrial fibrillation ([place]) 11/23/2019  . Physical deconditioning 11/23/2019  . Chronic gout of multiple sites 11/23/2019  . Hematochezia 11/22/2019  . Lower GI bleed 11/22/2019  . Diverticulosis   . Acute urinary retention s/p Foley 11/10/2019 11/10/2019  . Suprasphincteric perirectal abscess s/p I&D 11/10/2019 11/10/2019  . Diarrhea 11/10/2019  . Perineal abscess 11/10/2019  . GERD without esophagitis 11/10/2019  . Atrial fibrillation ([place]) 11/10/2019  . Poorly controlled diabetes mellitus ([place]) 11/09/2019  . Dyspnea on exertion 02/19/2017  . Bilateral lower extremity edema 07/06/2015  . CHF (congestive heart failure) ([place]) 06/27/2015  . Fungal dermatitis 06/27/2015  . Edema 06/20/2015  . Increasing shortness of breath 06/20/2015  . Lumbar radiculopathy, acute 03/09/2015  . Essential hypertension 12/27/2014  . Uncontrolled type 2 diabetes mellitus with hyperglycemia, with long-term current use of insulin ([place]) 12/27/2014  . Mild aortic stenosis 06/28/2014  . Memory loss 04/29/2014  . Tinnitus of both ears 02/17/2014  . Wellness examination 12/04/2013  .  Constipation due to slow transit 08/18/2013  . S/P left TKA 06/22/2013  . Meniere disease 04/21/2013  . Benign paroxysmal positional vertigo 04/21/2013  . History of methicillin resistant staphylococcus aureus (MRSA) 2015  . Psoriatic arthritis ([place]) 05/14/2012  . General medical exam 01/28/2012  . Cellulitis of eyelid 09/25/2011  . Diverticulitis 06/20/2011  . Erythrocytosis 05/18/2011  . S/P TKR (total knee replacement) 12/31/2010  . Obesity, Class III, BMI 40-49.9 (morbid obesity) ([place]) 12/31/2010  . Hyperlipidemia 12/22/2010  . BPH (benign prostatic hyperplasia) 12/22/2010  . Gout 12/22/2010  . Obstructive sleep apnea 04/04/2009    Orientation RESPIRATION BLADDER Height & Weight     Self, Time, Situation, Place  Normal Incontinent Weight: 290 lb 14.4 oz (132 kg) Height:  5\' 11"  (180.3 cm)  BEHAVIORAL SYMPTOMS/MOOD NEUROLOGICAL BOWEL NUTRITION STATUS      Continent Diet (Carb Modified diet)  AMBULATORY STATUS COMMUNICATION OF NEEDS Skin   Limited Assist Verbally Surgical wounds                       Personal Care Assistance Level of Assistance  Bathing, Feeding, Dressing Bathing Assistance: Limited assistance Feeding assistance: Independent Dressing Assistance: Limited assistance     Functional Limitations Info  Sight, Hearing, Speech Sight Info: Adequate Hearing Info: Adequate Speech Info: Adequate    SPECIAL CARE FACTORS FREQUENCY  PT (By licensed PT), OT (By licensed OT)     PT Frequency: Minimum 5x a week OT Frequency: Minimum 5x a week            Contractures Contractures Info: Not present    Additional Factors Info  Code Status, Allergies, Psychotropic, Insulin Sliding Scale, Isolation  Precautions Code Status Info: Full Code Allergies Info: Penicillins Lyrica Levofloxacin Psychotropic Info: sertraline (ZOLOFT) tablet 50 mg Insulin Sliding Scale Info: insulin aspart (novoLOG) injection 0-20 Unitsm 3x a day with meals Isolation Precautions  Info: MRSA     Current Medications (11/23/2019):  This is the current hospital active medication list Current Facility-Administered Medications  Medication Dose Route Frequency Provider Last Rate Last Admin  . 0.9 %  sodium chloride infusion   Intravenous Continuous Norins, Heinz Knuckles, MD 10 mL/hr at 11/22/19 1412 Rate Verify at 11/22/19 1412  . acetaminophen (TYLENOL) tablet 650 mg  650 mg Oral Q6H PRN Neena Rhymes, MD   650 mg at 11/22/19 1448   Or  . acetaminophen (TYLENOL) suppository 650 mg  650 mg Rectal Q6H PRN Norins, Heinz Knuckles, MD      . albuterol (VENTOLIN HFA) 108 (90 Base) MCG/ACT inhaler 2 puff  2 puff Inhalation Q6H PRN Norins, Heinz Knuckles, MD      . allopurinol (ZYLOPRIM) tablet 300 mg  300 mg Oral Daily Norins, Heinz Knuckles, MD   300 mg at 11/23/19 0923  . atorvastatin (LIPITOR) tablet 40 mg  40 mg Oral Daily Norins, Heinz Knuckles, MD   40 mg at 11/23/19 0923  . bisoprolol (ZEBETA) tablet 2.5 mg  2.5 mg Oral Daily Norins, Heinz Knuckles, MD   2.5 mg at 11/23/19 0923  . Chlorhexidine Gluconate Cloth 2 % PADS 6 each  6 each Topical Daily Dahal, Marlowe Aschoff, MD   6 each at 11/23/19 1000  . cholecalciferol (VITAMIN D) tablet 2,000 Units  2,000 Units Oral Daily Norins, Heinz Knuckles, MD   2,000 Units at 11/23/19 [phone]  . docusate sodium (COLACE) capsule 100 mg  100 mg Oral BID Norm Parcel, PA-C   100 mg at 11/23/19 1335  . donepezil (ARICEPT) tablet 10 mg  10 mg Oral QHS Norins, Heinz Knuckles, MD   10 mg at 11/22/19 2113  . empagliflozin (JARDIANCE) tablet 10 mg  10 mg Oral QAC breakfast Norins, Heinz Knuckles, MD   10 mg at 11/23/19 0926  . fluticasone (FLONASE) 50 MCG/ACT nasal spray 2 spray  2 spray Each Nare Daily Norins, Heinz Knuckles, MD   2 spray at 11/23/19 0927  . insulin aspart (novoLOG) injection 0-20 Units  0-20 Units Subcutaneous TID WC Norins, Heinz Knuckles, MD   3 Units at 11/23/19 1719  . loratadine (CLARITIN) tablet 10 mg  10 mg Oral Daily Norins, Heinz Knuckles, MD   10 mg at 11/23/19 0923  .  metroNIDAZOLE (FLAGYL) tablet 500 mg  500 mg Oral Q8H Norins, Heinz Knuckles, MD   500 mg at 11/23/19 1335  . pantoprazole (PROTONIX) EC tablet 80 mg  80 mg Oral Daily Neena Rhymes, MD   80 mg at 11/23/19 0923  . polyethylene glycol (MIRALAX / GLYCOLAX) packet 17 g  17 g Oral Daily PRN Norm Parcel, PA-C      . potassium chloride (KLOR-CON) CR tablet 10 mEq  10 mEq Oral Daily Norins, Heinz Knuckles, MD   10 mEq at 11/23/19 0923  . sertraline (ZOLOFT) tablet 50 mg  50 mg Oral Daily Norins, Heinz Knuckles, MD   50 mg at 11/23/19 0923  . tamsulosin (FLOMAX) capsule 0.4 mg  0.4 mg Oral QPC supper Neena Rhymes, MD   0.4 mg at 11/23/19 1719  . tiZANidine (ZANAFLEX) tablet 4 mg  4 mg Oral Q6H PRN Neena Rhymes, MD   4 mg at 11/22/19 2113  Discharge Medications: Please see discharge summary for a list of discharge medications.  Relevant Imaging Results:  Relevant Lab Results:   Additional Information SSN [id number]  Ross Ludwig, LCSW

## 2019-11-23 NOTE — Progress Notes (Signed)
Weighed patient on standing scale, when he stood up there was bleeding from his rectum. MD notified.

## 2019-11-23 NOTE — Evaluation (Signed)
Physical Therapy Evaluation Patient Details Name: Gary Butler MRN: [id number] DOB: 1945/12/19 Today's Date: 11/23/2019   History of Present Illness  74 y.o. male with a past medical history significant for diabetes, OSA, obesity, hypertension with a recent hospitalization 9/27-10/06/2019 for perirectal abscess requiring surgical I&D, also had urinary retention during that stay and failed voiding trial and was DC'd with Foley in place, had new onset A. fib and started on Eliquis, who presented to the ER on 11/21/2019 with hematochezia.  Clinical Impression  Pt admitted with above diagnosis.  Pt currently with functional limitations due to the deficits listed below (see PT Problem List). Pt will benefit from skilled PT to increase their independence and safety with mobility to allow discharge to the venue listed below.  Pt assisted with standing twice however became dizzy and requested to return to supine.  Pt also with small amounts of blood on bed pad (RN aware).  Recommend return to SNF upon d/c.      Follow Up Recommendations SNF    Equipment Recommendations  None recommended by PT    Recommendations for Other Services       Precautions / Restrictions Precautions Precautions: Fall Restrictions Weight Bearing Restrictions: No      Mobility  Bed Mobility Overal bed mobility: Needs Assistance Bed Mobility: Supine to Sit;Sit to Supine     Supine to sit: Min assist Sit to supine: Min guard   General bed mobility comments: assist to rise trunk  Transfers Overall transfer level: Needs assistance Equipment used:  (pt held onto weight scale bar) Transfers: Sit to/from Stand Sit to Stand: Min assist         General transfer comment: assist to rise, performed twice; pt dizzy with second stand and requested to sit, BP 112/67 mmHg upon return to supine  Ambulation/Gait                Stairs            Wheelchair Mobility    Modified Rankin (Stroke Patients  Only)       Balance           Standing balance support: No upper extremity supported Standing balance-Leahy Scale: Fair Standing balance comment: able to stand statically without UE support                             Pertinent Vitals/Pain Pain Assessment: No/denies pain Pain Intervention(s): Monitored during session;Repositioned    Home Living Family/patient expects to be discharged to:: [facility]: [place] Rile - 2 wheels;Cane - single point      Prior Function Level of Independence: Independent with assistive device(s)         Comments: prior to last admission, pt admitted from SNF and reports he was ambulating     Hand Dominance        Extremity/Trunk Assessment        Lower Extremity Assessment Lower Extremity Assessment: Generalized weakness    Cervical / Trunk Assessment Cervical / Trunk Assessment: Normal  Communication   Communication: No difficulties  Cognition Arousal/Alertness: Awake/alert Behavior During Therapy: WFL for tasks assessed/performed Overall Cognitive Status: Within Functional Limits for tasks assessed  General Comments      Exercises     Assessment/Plan    PT Assessment Patient needs continued PT services  PT Problem List Decreased activity tolerance;Decreased balance;Decreased mobility;Decreased knowledge of use of DME;Obesity       PT Treatment Interventions DME instruction;Gait training;Functional mobility training;Therapeutic activities;Therapeutic exercise;Balance training;Patient/family education    PT Goals (Current goals can be found in the Care Plan section)  Acute Rehab PT Goals PT Goal Formulation: With patient Time For Goal Achievement: 12/07/19 Potential to Achieve Goals: Good    Frequency Min 2X/week   Barriers to discharge        Co-evaluation               AM-PAC PT "6 Clicks"  Mobility  Outcome Measure Help needed turning from your back to your side while in a flat bed without using bedrails?: A Little Help needed moving from lying on your back to sitting on the side of a flat bed without using bedrails?: A Little Help needed moving to and from a bed to a chair (including a wheelchair)?: A Little Help needed standing up from a chair using your arms (e.g., wheelchair or bedside chair)?: A Little Help needed to walk in hospital room?: A Lot Help needed climbing 3-5 steps with a railing? : A Lot 6 Click Score: 16    End of Session Equipment Utilized During Treatment: Gait belt Activity Tolerance: Other (comment) (limited by dizziness) Patient left: in bed;with call bell/phone within reach;with bed alarm set Nurse Communication: Mobility status PT Visit Diagnosis: Difficulty in walking, not elsewhere classified (R26.2)    Time: 1045-1100 PT Time Calculation (min) (ACUTE ONLY): 15 min   Charges:   PT Evaluation $PT Eval Low Complexity: 1 Low     Kati PT, DPT Acute Rehabilitation Services Pager: [phone] Office: [phone]   York Ram E 11/23/2019, 12:20 PM

## 2019-11-24 DIAGNOSIS — I480 Paroxysmal atrial fibrillation: Secondary | ICD-10-CM | POA: Diagnosis not present

## 2019-11-24 LAB — BASIC METABOLIC PANEL
Anion gap: 13 (ref 5–15)
BUN: 9 mg/dL (ref 8–23)
CO2: 21 mmol/L — ABNORMAL LOW (ref 22–32)
Calcium: 8.6 mg/dL — ABNORMAL LOW (ref 8.9–10.3)
Chloride: 104 mmol/L (ref 98–111)
Creatinine, Ser: 0.92 mg/dL (ref 0.61–1.24)
GFR, Estimated: >60 mL/min (ref >60)
Glucose, Bld: 112 mg/dL — ABNORMAL HIGH (ref 70–99)
Potassium: 3.7 mmol/L (ref 3.5–5.1)
Sodium: 138 mmol/L (ref 135–145)

## 2019-11-24 LAB — CBC WITH DIFFERENTIAL/PLATELET
Abs Immature Granulocytes: 0.05 10*3/uL (ref 0.00–0.07)
Basophils Absolute: 0.1 10*3/uL (ref 0.0–0.1)
Basophils Relative: 1 %
Eosinophils Absolute: 0.3 10*3/uL (ref 0.0–0.5)
Eosinophils Relative: 4 %
HCT: 37.1 % — ABNORMAL LOW (ref 39.0–52.0)
Hemoglobin: 12.2 g/dL — ABNORMAL LOW (ref 13.0–17.0)
Immature Granulocytes: 1 %
Lymphocytes Relative: 16 %
Lymphs Abs: 1 10*3/uL (ref 0.7–4.0)
MCH: 31.9 pg (ref 26.0–34.0)
MCHC: 32.9 g/dL (ref 30.0–36.0)
MCV: 97.1 fL (ref 80.0–100.0)
Monocytes Absolute: 0.5 10*3/uL (ref 0.1–1.0)
Monocytes Relative: 7 %
Neutro Abs: 4.7 10*3/uL (ref 1.7–7.7)
Neutrophils Relative %: 71 %
Platelets: 244 10*3/uL (ref 150–400)
RBC: 3.82 MIL/uL — ABNORMAL LOW (ref 4.22–5.81)
RDW: 17.4 % — ABNORMAL HIGH (ref 11.5–15.5)
WBC: 6.6 10*3/uL (ref 4.0–10.5)
nRBC: 0 % (ref 0.0–0.2)

## 2019-11-24 LAB — GLUCOSE, CAPILLARY
Glucose-Capillary: 93 mg/dL (ref 70–99)
Glucose-Capillary: 125 mg/dL — ABNORMAL HIGH (ref 70–99)
Glucose-Capillary: 106 mg/dL — ABNORMAL HIGH (ref 70–99)
Glucose-Capillary: 120 mg/dL — ABNORMAL HIGH (ref 70–99)

## 2019-11-24 LAB — RESPIRATORY PANEL BY RT PCR (FLU A&B, COVID)
Influenza A by PCR: NEGATIVE
Influenza B by PCR: NEGATIVE
SARS Coronavirus 2 by RT PCR: NEGATIVE

## 2019-11-24 LAB — HEMOGLOBIN: Hemoglobin: 11.8 g/dL — ABNORMAL LOW (ref 13.0–17.0)

## 2019-11-24 MED ORDER — FUROSEMIDE 20 MG PO TABS
20.0000 mg | ORAL_TABLET | Freq: Every day | ORAL | Status: DC
  Administered 2019-11-24 – 2019-11-25 (×2): 20 mg via ORAL
  Filled 2019-11-24 (×2): qty 1

## 2019-11-24 NOTE — TOC Progression Note (Signed)
Transition of Care [facility]) - Progression Note    Patient Details  Name: Nikolaos Maddocks MRN: [id number] Date of Birth: 1945/08/10  Transition of Care [facility]) CM/SW Contact  Joaquin Courts, RN Phone Number: 11/24/2019, 10:57 AM  Clinical Narrative:   CM initiated authorization process for SNF, requested clinicals faxed to [facility], reference # [id number].  MD to order updated Covid test. CM confirmed [facility] has availability to accpet patient back.      Expected Discharge Plan: [place] Barriers to Discharge: Continued Medical Work up, Ship broker  Expected Discharge Plan and Services Expected Discharge Plan: [place] Choice: [place] arrangements for the past 2 months: Single Family Home, [place]                                       Social Determinants of Health (SDOH) Interventions    Readmission Risk Interventions No flowsheet data found.

## 2019-11-24 NOTE — TOC Progression Note (Signed)
Transition of Care [facility]) - Progression Note    Patient Details  Name: Gary Butler MRN: [id number] Date of Birth: 1945-04-27  Transition of Care [facility]) CM/SW Contact  Joaquin Courts, RN Phone Number: 11/24/2019, 1:47 PM  Clinical Narrative:    Insurance auth complete, auth # [id number], expires on 11/26/19.     Expected Discharge Plan: [place] Barriers to Discharge: Continued Medical Work up, Ship broker  Expected Discharge Plan and Services Expected Discharge Plan: [place] Choice: [place] arrangements for the past 2 months: Single Family Home, [place]                                       Social Determinants of Health (SDOH) Interventions    Readmission Risk Interventions No flowsheet data found.

## 2019-11-24 NOTE — Progress Notes (Signed)
Responded to patient's request to attempt to urinate. Patient sat at the side of the bed and was unable to urinate into urinal, then stood at side of bed and was unable to urinate into urinal. Patient was then transferred to the bedside commode and did produce a bowel movement but was still unable to urinate. Replaced packing to perineal area as it was saturated and soiled. Margins unapproximated, wound bed red and nonpainful, packed loosely with saline moistened gauze. Bladder scan of the patient now showing <75 mL, page sent to on call physcian.

## 2019-11-24 NOTE — Progress Notes (Signed)
Bladder scanned patient per protocol following removal of foley catheter. Bladder scan set to deep settings and pubic bone located with probe. Scan depicted 54mL, patient has had poor PO fluid intake and received Lasix today prior to foley catheter removal.

## 2019-11-24 NOTE — Progress Notes (Signed)
PROGRESS NOTE  Gary Butler  DOB: [birth year]  PCP: [name], MD IRC:[phone]  DOA: 11/21/2019  LOS: 2 days   Chief Complaint  Patient presents with  . Rectal Bleeding   Brief narrative: Gary Butler is a 74 y.o. male with PMH of obesity, sleep apnea, HTN, DM 2, psoriatic arthritis who was recently hospitalized 9/27-10/06/2019 for perirectal abscess requiring surgical I&D.  During that stay, he had urinary retention, failed voiding trial and was discharged to [facility] with indwelling Foley catheter.  He also developed new onset A. fib and was started on Eliquis. Patient presented to the ED on 11/21/2019 after an episode of bright red blood and clots per rectum.  In the ED, he had a second witnessed episode of hematochezia with clots.   Hemodynamically stable in the ED. EKG showed normal sinus rhythm. Lab with hemoglobin of 15, lactic acid 1.3 and subsequently increased to 2.2. CT abdomen pelvis with contrast was obtained which did not show any definite source of bleeding. It showed postsurgical changes in the perineum without evidence of infection. Patient was admitted to hospitalist service for lower GI bleeding.  He was evaluated by GI and general surgery as well. He was noted to have some oozing from perineal surgical site with general surgery did a packing.   Subjective: Patient was seen and examined this morning. Propped up in bed.  Not in distress.  No new symptoms.  Per RN, he had a bowel movement this morning without blood.  However there is a drop in hemoglobin from 12.8 yesterday to 11.8 today. GI and general surgery following.  Assessment/Plan: Acute on chronic GI bleeding. -Presented with abrupt onset painless hematochezia while on Eliquis. -Patient states that he normally has small amount of blood in his stool which he attributes to hemorrhoids.  However for the 24 hours prior to presentation, he had multiple episodes of large amount of bright red blood with  clots without abdominal pain. -Currently Eliquis is on hold.  Since admission, patient has had 2 episodes of maroon-colored stools.  GI consult obtained.  He had CT angio of abdomen pelvis which did not show any active bleeding/extravasation in the GI tract.  -Continue to monitor hemoglobin.  Bleeding from recent perineal surgery site -Patient underwent incision and drainage of a high perirectal abscess on 11/10/2019 by Dr. Johney Maine. He was noted to have hemorrhoids without thrombosis at that point. He was discharged on Keflex and Flagyl for 10 days and fluconazole.  Patient reportedly completed the course. -Wound site was evaluated by general surgery on 10/12. Per general surgery, perirectal incision site is clean and had only small amount of bruising from the wound surfaces.  Paroxysmal A. Fib -Newly diagnosed on last admission and was started on Eliquis for elevated chads vas score. -Currently on normal sinus rhythm.  Eliquis on hold. -Continue telemetry monitoring.  Chronic diastolic CHF Essential hypertension -last echo 9/'21 with EF 60-65% and grade II diastolic dysfxn.  -Currently no evidence of decompensation.   -Blood pressure stable -Continue bisoprolol.  Resume Lasix today. -Continue statin.   Diabetes mellitus type 2  -A1c 9.1% on 10/26/2019  -Seems to be on Jardiance only at home.  Continue the same.   -Continue sliding scale insulin with Accu-Cheks.   Recent Labs  Lab 11/23/19 1118 11/23/19 1638 11/23/19 2033 11/24/19 0751 11/24/19 1154  GLUCAP 107* 136* 125* 93 125*   ? Dementia -Patient reports of memory deficits.  Continue Aricept.  BPH and recent acute urinary  retention  -Currently has indwelling Foley catheter since last hospitalization.  -Attempt a voiding trial today. -Continue Flomax.  Morbid obesity -Body mass index is 40.57 kg/m. Patient has been advised to make an attempt to improve diet and exercise patterns to aid in weight loss.  Obstructive  sleep apnea -Continue nocturnal BiPAP.  Mobility: Encourage ambulation Code Status:   Code Status: Full Code  Nutritional status: Body mass index is 40.57 kg/m.     Diet Order            Diet Carb Modified Fluid consistency: Thin; Room service appropriate? Yes  Diet effective now                 DVT prophylaxis: SCDs Start: 11/22/19 0326   Antimicrobials:  None Fluid: None Consultants: GI, general surgery Family Communication:  None at bedside  Status is: Inpatient  Remains inpatient appropriate because, dropping hemoglobin, need to monitor hemoglobin   Dispo: The patient is from: SNF              Anticipated d/c is to: SNF               Anticipated d/c date is: 1-2 days              Patient currently is not medically stable to d/c.   Infusions:  . sodium chloride 10 mL/hr at 11/22/19 1412    Scheduled Meds: . allopurinol  300 mg Oral Daily  . atorvastatin  40 mg Oral Daily  . bisoprolol  2.5 mg Oral Daily  . Chlorhexidine Gluconate Cloth  6 each Topical Daily  . cholecalciferol  2,000 Units Oral Daily  . docusate sodium  100 mg Oral BID  . donepezil  10 mg Oral QHS  . empagliflozin  10 mg Oral QAC breakfast  . fluticasone  2 spray Each Nare Daily  . furosemide  20 mg Oral Daily  . insulin aspart  0-20 Units Subcutaneous TID WC  . loratadine  10 mg Oral Daily  . metroNIDAZOLE  500 mg Oral Q8H  . pantoprazole  80 mg Oral Daily  . potassium chloride  10 mEq Oral Daily  . sertraline  50 mg Oral Daily  . tamsulosin  0.4 mg Oral QPC supper    Antimicrobials: Anti-infectives (From admission, onward)   Start     Dose/Rate Route Frequency Ordered Stop   11/22/19 1000  cephALEXin (KEFLEX) capsule 1,000 mg  Status:  Discontinued        1,000 mg Oral 3 times daily 11/22/19 0324 11/22/19 1510   11/22/19 0600  metroNIDAZOLE (FLAGYL) tablet 500 mg        500 mg Oral Every 8 hours 11/22/19 0324        PRN meds: acetaminophen **OR** acetaminophen, albuterol,  polyethylene glycol, tiZANidine   Objective: Vitals:   11/23/19 2040 11/24/19 0344  BP: 106/63 (!) 106/56  Pulse: 72 66  Resp: 18 18  Temp: 98.1 F (36.7 C) 97.9 F (36.6 C)  SpO2: 94% 95%    Intake/Output Summary (Last 24 hours) at 11/24/2019 1309 Last data filed at 11/24/2019 1100 Gross per 24 hour  Intake 240 ml  Output 800 ml  Net -560 ml   Filed Weights   11/21/19 2309 11/23/19 1053  Weight: 118.4 kg 132 kg   Weight change:  Body mass index is 40.57 kg/m.   Physical Exam: General exam: Morbidly obese.  Appears calm and comfortable. Not in physical distress Skin: No rashes, lesions or  ulcers. HEENT: Atraumatic, normocephalic, supple neck, no obvious bleeding Lungs: Clear to auscultation bilaterally CVS: Regular rate and rhythm, no murmur GI/Abd soft, distended from obesity, bowel sound present CNS: Alert, awake, oriented x3 Psychiatry: Mood appropriate Extremities: No pedal edema, no calf tenderness  Data Review: I have personally reviewed the laboratory data and studies available.  Recent Labs  Lab 11/21/19 2255 11/22/19 0500 11/23/19 0056 11/23/19 0056 11/23/19 0703 11/23/19 1254 11/23/19 1827 11/24/19 0058 11/24/19 0743  WBC 7.3  --  6.5  --   --   --   --  6.6  --   NEUTROABS 5.4  --  4.7  --   --   --   --  4.7  --   HGB 15.0   < > 12.7*   < > 13.3 15.0 12.8* 12.2* 11.8*  HCT 45.2  --  38.7*  --   --   --   --  37.1*  --   MCV 94.2  --  97.0  --   --   --   --  97.1  --   PLT 254  --  242  --   --   --   --  244  --    < > = values in this interval not displayed.   Recent Labs  Lab 11/21/19 2255 11/22/19 1413 11/23/19 0056 11/24/19 0058  NA 139 138 138 138  K 3.2* 3.2* 4.4 3.7  CL 103 106 104 104  CO2 24 23 22  21*  GLUCOSE 142* 145* 115* 112*  BUN 7* 6* 6* 9  CREATININE 0.98 0.90 0.85 0.92  CALCIUM 8.8* 8.2* 8.3* 8.6*    F/u labs ordered  Signed, Terrilee Croak, MD [facility] 11/24/2019

## 2019-11-25 DIAGNOSIS — R413 Other amnesia: Secondary | ICD-10-CM | POA: Diagnosis not present

## 2019-11-25 DIAGNOSIS — K219 Gastro-esophageal reflux disease without esophagitis: Secondary | ICD-10-CM | POA: Diagnosis not present

## 2019-11-25 DIAGNOSIS — D6869 Other thrombophilia: Secondary | ICD-10-CM | POA: Diagnosis not present

## 2019-11-25 DIAGNOSIS — I4891 Unspecified atrial fibrillation: Secondary | ICD-10-CM | POA: Diagnosis not present

## 2019-11-25 DIAGNOSIS — I69828 Other speech and language deficits following other cerebrovascular disease: Secondary | ICD-10-CM | POA: Diagnosis not present

## 2019-11-25 DIAGNOSIS — Z7401 Bed confinement status: Secondary | ICD-10-CM | POA: Diagnosis not present

## 2019-11-25 DIAGNOSIS — I480 Paroxysmal atrial fibrillation: Secondary | ICD-10-CM | POA: Diagnosis not present

## 2019-11-25 DIAGNOSIS — K921 Melena: Secondary | ICD-10-CM | POA: Diagnosis not present

## 2019-11-25 DIAGNOSIS — R5381 Other malaise: Secondary | ICD-10-CM | POA: Diagnosis not present

## 2019-11-25 DIAGNOSIS — J069 Acute upper respiratory infection, unspecified: Secondary | ICD-10-CM | POA: Diagnosis not present

## 2019-11-25 DIAGNOSIS — E782 Mixed hyperlipidemia: Secondary | ICD-10-CM | POA: Diagnosis not present

## 2019-11-25 DIAGNOSIS — R41841 Cognitive communication deficit: Secondary | ICD-10-CM | POA: Diagnosis not present

## 2019-11-25 DIAGNOSIS — E1165 Type 2 diabetes mellitus with hyperglycemia: Secondary | ICD-10-CM | POA: Diagnosis not present

## 2019-11-25 DIAGNOSIS — L02215 Cutaneous abscess of perineum: Secondary | ICD-10-CM | POA: Diagnosis not present

## 2019-11-25 DIAGNOSIS — K611 Rectal abscess: Secondary | ICD-10-CM | POA: Diagnosis not present

## 2019-11-25 DIAGNOSIS — E43 Unspecified severe protein-calorie malnutrition: Secondary | ICD-10-CM | POA: Diagnosis not present

## 2019-11-25 DIAGNOSIS — M6281 Muscle weakness (generalized): Secondary | ICD-10-CM | POA: Diagnosis not present

## 2019-11-25 DIAGNOSIS — N401 Enlarged prostate with lower urinary tract symptoms: Secondary | ICD-10-CM | POA: Diagnosis not present

## 2019-11-25 DIAGNOSIS — L4050 Arthropathic psoriasis, unspecified: Secondary | ICD-10-CM | POA: Diagnosis not present

## 2019-11-25 DIAGNOSIS — G4733 Obstructive sleep apnea (adult) (pediatric): Secondary | ICD-10-CM | POA: Diagnosis not present

## 2019-11-25 DIAGNOSIS — I5032 Chronic diastolic (congestive) heart failure: Secondary | ICD-10-CM | POA: Diagnosis not present

## 2019-11-25 DIAGNOSIS — J310 Chronic rhinitis: Secondary | ICD-10-CM | POA: Diagnosis not present

## 2019-11-25 DIAGNOSIS — K5790 Diverticulosis of intestine, part unspecified, without perforation or abscess without bleeding: Secondary | ICD-10-CM | POA: Diagnosis not present

## 2019-11-25 DIAGNOSIS — R338 Other retention of urine: Secondary | ICD-10-CM | POA: Diagnosis not present

## 2019-11-25 DIAGNOSIS — Z9989 Dependence on other enabling machines and devices: Secondary | ICD-10-CM | POA: Diagnosis not present

## 2019-11-25 DIAGNOSIS — M2550 Pain in unspecified joint: Secondary | ICD-10-CM | POA: Diagnosis not present

## 2019-11-25 DIAGNOSIS — R2681 Unsteadiness on feet: Secondary | ICD-10-CM | POA: Diagnosis not present

## 2019-11-25 DIAGNOSIS — I10 Essential (primary) hypertension: Secondary | ICD-10-CM | POA: Diagnosis not present

## 2019-11-25 DIAGNOSIS — Z48817 Encounter for surgical aftercare following surgery on the skin and subcutaneous tissue: Secondary | ICD-10-CM | POA: Diagnosis not present

## 2019-11-25 LAB — CBC WITH DIFFERENTIAL/PLATELET
Abs Immature Granulocytes: 0.03 10*3/uL (ref 0.00–0.07)
Basophils Absolute: 0.1 10*3/uL (ref 0.0–0.1)
Basophils Relative: 1 %
Eosinophils Absolute: 0.3 10*3/uL (ref 0.0–0.5)
Eosinophils Relative: 5 %
HCT: 39.2 % (ref 39.0–52.0)
Hemoglobin: 12.8 g/dL — ABNORMAL LOW (ref 13.0–17.0)
Immature Granulocytes: 1 %
Lymphocytes Relative: 17 %
Lymphs Abs: 1 10*3/uL (ref 0.7–4.0)
MCH: 32 pg (ref 26.0–34.0)
MCHC: 32.7 g/dL (ref 30.0–36.0)
MCV: 98 fL (ref 80.0–100.0)
Monocytes Absolute: 0.5 10*3/uL (ref 0.1–1.0)
Monocytes Relative: 8 %
Neutro Abs: 4 10*3/uL (ref 1.7–7.7)
Neutrophils Relative %: 68 %
Platelets: 223 10*3/uL (ref 150–400)
RBC: 4 MIL/uL — ABNORMAL LOW (ref 4.22–5.81)
RDW: 17.4 % — ABNORMAL HIGH (ref 11.5–15.5)
WBC: 5.9 10*3/uL (ref 4.0–10.5)
nRBC: 0 % (ref 0.0–0.2)

## 2019-11-25 LAB — BASIC METABOLIC PANEL
Anion gap: 15 (ref 5–15)
BUN: 9 mg/dL (ref 8–23)
CO2: 19 mmol/L — ABNORMAL LOW (ref 22–32)
Calcium: 8.5 mg/dL — ABNORMAL LOW (ref 8.9–10.3)
Chloride: 102 mmol/L (ref 98–111)
Creatinine, Ser: 0.8 mg/dL (ref 0.61–1.24)
GFR, Estimated: >60 mL/min (ref >60)
Glucose, Bld: 98 mg/dL (ref 70–99)
Potassium: 3.8 mmol/L (ref 3.5–5.1)
Sodium: 136 mmol/L (ref 135–145)

## 2019-11-25 LAB — GLUCOSE, CAPILLARY
Glucose-Capillary: 90 mg/dL (ref 70–99)
Glucose-Capillary: 114 mg/dL — ABNORMAL HIGH (ref 70–99)
Glucose-Capillary: 113 mg/dL — ABNORMAL HIGH (ref 70–99)

## 2019-11-25 NOTE — TOC Transition Note (Signed)
Transition of Care [facility]) - CM/SW Discharge Note   Patient Details  Name: Gary Butler MRN: [id number] Date of Birth: [birth year]  Transition of Care [facility]) CM/SW Contact:  Ross Ludwig, LCSW Phone Number: 11/25/2019, 3:09 PM   Clinical Narrative:     Patient to be d/c'ed today to [place] room 509.  Patient and family agreeable to plans will transport via ems RN to call report to [phone].     Final next level of care: [place] .d. Barriers to Discharge: Barriers Resolved   Patient Goals and CMS Choice Patient states their goals for this hospitalization and ongoing recovery are:: To return to SNF to continue with rehab, then return back home. CMS Medicare.gov Compare Post Acute Care list provided to:: Patient Represenative (must comment) Choice offered to / list presented to : Spouse  Discharge Placement   Existing PASRR number confirmed : 11/23/19          Patient [facility] bed at: [facility] Patient to be transferred to facility by: [facility] Name of family member notified: Nilo, Fallin [phone]  [phone] Patient and family notified of of transfer: 11/25/19  Discharge Plan and Services     Post Acute Care Choice: [place]            DME Agency: NA       HH Arranged: NA          Social Determinants of Health (SDOH) Interventions     Readmission Risk Interventions No flowsheet data found.

## 2019-11-25 NOTE — Progress Notes (Signed)
Patient aggravated, refused lab draw this morning.

## 2019-11-25 NOTE — Discharge Summary (Signed)
Discharge Summary  Gary Butler YPP:[id number] DOB: 31-Jan-1946  PCP: [name], MD  Admit date: 11/21/2019 Discharge date: 11/25/2019  Time spent: 83mins, more than 50% time spent on coordination of care.  Wife updated over the phone, case discussed with general surgery and urology today.  Recommendations for Outpatient Follow-up:  1. F/u with SNF MD for hospital discharge follow up, repeat cbc/bmp at follow up 2. F/u with general surgery 3. F/u with [facility] Dr Claudia Desanctis 4. Follow-up with [place]  Discharge Diagnoses:  Active Hospital Problems   Diagnosis Date Noted  . Hematochezia 11/22/2019  . Lower GI bleed 11/22/2019  . Atrial fibrillation ([place]) 11/10/2019  . Poorly controlled diabetes mellitus ([place]) 11/09/2019  . CHF (congestive heart failure) ([place]) 06/27/2015  . Essential hypertension 12/27/2014  . BPH (benign prostatic hyperplasia) 12/22/2010  . Obstructive sleep apnea 04/04/2009    Resolved Hospital Problems  No resolved problems to display.    Discharge Condition: stable  Diet recommendation: heart healthy/carb modified  Filed Weights   11/21/19 2309 11/23/19 1053  Weight: 118.4 kg 132 kg    History of present illness: (Per admitting MD Dr. Linda Hedges) Chief Complaint: hematochezia  HPI: Gary Butler is a 74 y.o. male with medical history significant of DM, OSA, obesity, HTN with a recent hospitalization 9/27-10/5/21 for perirectal abscess requiring surgical I&D. He had urinary retention during that stay and failed voiding trail and was d/c'd with foley in place. He had new onset a. Fib during this hospitalization and due CHADsVasc score of 3 was started on Eliquis.  October 9th he had an episode of bright red blood and clots per rectum for which he presented to the [facility]. He had a second witnessed episode of hematochezia with clot while in the ED.  ED Course: T 98.1  121/64  HR 66  RR 22 EDP exam included exam of recent perineal operative site  which by report was intact w/o evidence of bleeding or drainage.  Lab revealed Hgb 15, serum glucose of 142. Lactic acid #1 1.3, #2 2.2. He was covid negative on 11/17/19. CT scan abdomen/pelvis revealed decreased emphysema in the perineal tissue but not source of bleeding was identified. TRH called to admit patient for monitoring of LGI bleed.   Hospital Course:  Active Problems:   Obstructive sleep apnea   BPH (benign prostatic hyperplasia)   Essential hypertension   CHF (congestive heart failure) ([place])   Poorly controlled diabetes mellitus (HCC)   Atrial fibrillation (H[facility])   Hematochezia   Lower GI bleed   Acute on chronic GI bleeding. -Presented with abrupt onset painless hematochezia while on Eliquis. -presents with  multiple episodes of large amount of bright red blood with clots without abdominal pain. -GI consulted,  He had CT angio of abdomen pelvis which did not show any active bleeding/extravasation in the GI tract.  -GI do not plan to do colonoscopy as they think bleeding is from perineal surgery site  Bleeding from recent perineal surgery site -Patient underwent incision and drainage of a high perirectal abscess on 11/10/2019 by Dr. Johney Maine. He was noted to have hemorrhoids without thrombosis at that point.He was discharged on Keflex and Flagyl for 10 days and fluconazole.  Patient reportedly completed the course. -Wound site was evaluated by general surgery on 10/11 and on 10/13. Per general surgery, perirectal incision site is clean and had only small amount of bruising from the wound surfaces. -General surgery recommend change dressing daily and follow-up with general surgery in the  office, okayed to resume Eliquis at discharge  Paroxysmal A. Fib -Newly diagnosed on last admission and was started on Eliquis for elevated chads vas score. -Currently  normal sinus rhythm.  Eliquis held in the hospital, resumed at discharge per general surgery recommendation   Chronic  diastolic CHF Essential hypertension -last echo 9/'21 with EF 60-65% and grade II diastolic dysfxn.  -Currently no evidence of decompensation.   -Blood pressure stable -Continue bisoprolol, Lasix . -Continue statin.   Non-insulin-dependent diabetes mellitus type 2 , not well controlled -A1c 9.1% on 10/26/2019  -Seems to be on Jardiance only at home.  Continue the same.     ? Dementia -Patient reports of memory deficits.  Continue home meds Aricept.  BPH and recent acute urinary retention  -Currently has indwelling Foley catheter since last hospitalization.  -Failed voiding trial with 800 cc urine retention in the hospital, reinserted Foley -Continue Flomax. -Follow-up with urology Dr. Claudia Desanctis  Morbid obesity -Body mass index is 40.57 kg/m. Patient has been advised to make an attempt to improve diet and exercise patterns to aid in weight loss.  Obstructive sleep apnea -Continue nocturnal BiPAP.  Mobility: Encourage ambulation Code Status:  Code Status: Full Code      Procedures:  Foley placement  Consultations:  General surgery  Phone conversation with urology Dr. Alinda Money  Discharge Exam: BP 112/63 (BP Location: Left Arm)   Pulse 60   Temp 98 F (36.7 C) (Oral)   Resp 15   Ht 5\' 11"  (1.803 m)   Wt 132 kg   SpO2 100%   BMI 40.57 kg/m   General: NAD Cardiovascular: RRR Respiratory: Normal respiratory effort  Discharge Instructions You were cared for by a hospitalist during your hospital stay. If you have any questions about your discharge medications or the care you received while you were in the hospital after you are discharged, you can call the unit and asked to speak with the hospitalist on call if the hospitalist that took care of you is not available. Once you are discharged, your primary care physician will handle any further medical issues. Please note that NO REFILLS for any discharge medications will be authorized once you are discharged, as it  is imperative that you return to your primary care physician (or establish a relationship with a primary care physician if you do not have one) for your aftercare needs so that they can reassess your need for medications and monitor your lab values.  Discharge Instructions    Diet - low sodium heart healthy   Complete by: As directed    Discharge wound care:   Complete by: As directed    Change packing daily, follow up with general surgery   Increase activity slowly   Complete by: As directed      Allergies as of 11/25/2019      Reactions   Penicillins Hives   Patient reports full body hives that required medical treatment when he was in his 52s or 68s. He tolerated cephalosporins.    Lyrica [pregabalin] Swelling   Levofloxacin Swelling      Medication List    STOP taking these medications   cephALEXin 500 MG capsule Commonly known as: KEFLEX   metroNIDAZOLE 500 MG tablet Commonly known as: FLAGYL     TAKE these medications   Albuterol Sulfate 108 (90 Base) MCG/ACT Aepb Commonly known as: ProAir RespiClick Inhale 2 puffs into the lungs every 6 (six) hours as needed (for wheezing or shortness of breath).  allopurinol 300 MG tablet Commonly known as: ZYLOPRIM TAKE 1 TABLET(300 MG) BY MOUTH DAILY What changed: See the new instructions.   apixaban 5 MG Tabs tablet Commonly known as: ELIQUIS Take 1 tablet (5 mg total) by mouth 2 (two) times daily.   aspirin 81 MG tablet Take 81 mg by mouth daily.   atorvastatin 40 MG tablet Commonly known as: LIPITOR TAKE 1 TABLET(40 MG) BY MOUTH EVERY EVENING What changed:   how much to take  how to take this  when to take this  additional instructions   B-12 2000 MCG Tabs Take 2,000 mcg by mouth daily.   bisacodyl 10 MG suppository Commonly known as: DULCOLAX Place 10 mg rectally as needed for moderate constipation (if not relieved by MOM).   bisoprolol 5 MG tablet Commonly known as: ZEBETA Take 0.5 tablets (2.5 mg  total) by mouth daily.   cetirizine 10 MG tablet Commonly known as: ZYRTEC TAKE 1 TABLET(10 MG) BY MOUTH DAILY What changed:   how much to take  how to take this  when to take this   donepezil 10 MG tablet Commonly known as: ARICEPT TAKE 1 TABLET(10 MG) BY MOUTH AT BEDTIME What changed: See the new instructions.   feeding supplement (GLUCERNA SHAKE) Liqd Take 237 mLs by mouth daily.   fluocinonide cream 0.05 % Commonly known as: LIDEX Apply 1 application topically 2 (two) times daily. To arms for psoriasis   fluticasone 50 MCG/ACT nasal spray Commonly known as: FLONASE Place 2 sprays into both nostrils daily.   folic acid 1 MG tablet Commonly known as: FOLVITE Take 1 mg by mouth daily.   furosemide 20 MG tablet Commonly known as: LASIX TAKE 1 TABLET(20 MG) BY MOUTH DAILY What changed:   how much to take  how to take this  when to take this   Jardiance 10 MG Tabs tablet Generic drug: empagliflozin Take 10 mg by mouth daily before breakfast.   magnesium hydroxide 400 MG/5ML suspension Commonly known as: MILK OF MAGNESIA Take 30 mLs by mouth daily as needed for mild constipation. If no bowel movement in 3 days   meloxicam 15 MG tablet Commonly known as: MOBIC Take 1 tablet by mouth daily. What changed:   how much to take  how to take this  when to take this   omeprazole 40 MG capsule Commonly known as: PRILOSEC TAKE 1 CAPSULE BY MOUTH 30 TO 60 MINUTES BEFORE YOUR FIRST AND LAST MEAL OF THE DAY What changed:   how much to take  how to take this  when to take this   OVER THE COUNTER MEDICATION Take 1 Bottle by mouth in the morning and at bedtime. Magic Cup   polyethylene glycol 17 g packet Commonly known as: MIRALAX / GLYCOLAX Take 17 g by mouth daily as needed for mild constipation. What changed: reasons to take this   potassium chloride 10 MEQ CR capsule Commonly known as: MICRO-K TAKE 1 CAPSULE BY MOUTH EVERY DAY What changed:   how  much to take  how to take this  when to take this   psyllium 95 % Pack Commonly known as: HYDROCIL/METAMUCIL Take 1 packet by mouth 2 (two) times daily.   RA SALINE ENEMA RE Place 1 Bottle rectally daily as needed (constipation if not relieved by biscodyl supp.).   sertraline 50 MG tablet Commonly known as: ZOLOFT TAKE 1 TABLET(50 MG) BY MOUTH DAILY. What changed:   how much to take  how to take this  when to take this   tamsulosin 0.4 MG Caps capsule Commonly known as: FLOMAX Take 1 capsule (0.4 mg total) by mouth daily after supper.   tiZANidine 4 MG tablet Commonly known as: ZANAFLEX Take 1 tablet (4 mg total) by mouth every 6 (six) hours as needed for muscle spasms.   Vitamin D 50 MCG (2000 UT) tablet Take 2,000 Units by mouth daily.            Discharge Care Instructions  (From admission, onward)         Start     Ordered   11/25/19 0000  Discharge wound care:       Comments: Change packing daily, follow up with general surgery   11/25/19 1213         Allergies  Allergen Reactions  . Penicillins Hives    Patient reports full body hives that required medical treatment when he was in his 49s or 73s. He tolerated cephalosporins.   [name] [Pregabalin] Swelling  . Levofloxacin Swelling    Follow-up Information    [facility], [place]. Go on 12/03/2019.   Specialty: General Surgery Why: Follow up for wound check scheduled for 2:30 PM. Please arrive 30 min prior to appointment time. Bring photo ID and insurance information with you.  Contact information: [address] [place] [address] [phone]        Robley Fries, MD Follow up on 12/17/2019.   Specialty: Urology Contact information: [place] [address] [phone]        [name], MD Follow up.   Specialty: Family Medicine Contact information: [address] [place] [address] [phone]        Troy Sine, MD .   Specialty: Cardiology Contact information: [address] [place] [address] [phone]                The results of significant diagnostics from this hospitalization (including imaging, microbiology, ancillary and laboratory) are listed below for reference.    Significant Diagnostic Studies: MR Brain Wo Contrast  Result Date: 10/29/2019 CLINICAL DATA:  Memory loss.  Dementia. EXAM: MRI HEAD WITHOUT CONTRAST TECHNIQUE: Multiplanar, multiecho pulse sequences of the brain and surrounding structures were obtained without intravenous contrast. COMPARISON:  02/28/2006 FINDINGS: Brain: Generalized atrophy with relative frontal and temporal predominance. Diffusion imaging does not show any acute or subacute infarction. Mild chronic small-vessel ischemic changes affect pons. No focal cerebellar finding. Cerebral hemispheres show moderate chronic small-vessel ischemic change of the white matter. No cortical or large vessel territory infarction. No mass lesion, hemorrhage, hydrocephalus or extra-axial collection. Vascular: Major vessels at the base of the brain show flow. Skull and upper cervical spine: Negative Sinuses/Orbits: Clear/normal Other: None IMPRESSION: No acute or reversible finding. Generalized atrophy with relative frontal and temporal predominance. Moderate chronic small-vessel ischemic changes of the cerebral hemispheric white matter. Electronically Signed   By: Nelson Chimes M.D.   On: 10/29/2019 10:53   CT ABDOMEN PELVIS W CONTRAST  Result Date: 11/22/2019 CLINICAL DATA:  Chronic anticoagulation, rectal bleeding EXAM: CT ABDOMEN AND PELVIS WITH CONTRAST TECHNIQUE: Multidetector CT imaging of the abdomen and pelvis was performed using the standard protocol following bolus administration of intravenous contrast. CONTRAST:  167mL OMNIPAQUE IOHEXOL 300 MG/ML  SOLN COMPARISON:  11/09/2019 FINDINGS: Lower chest: Tiny bilateral pleural effusions are  present with associated mild bibasilar compressive atelectasis. Extensive calcification of the aortic valve  leaflets. Global cardiac size within normal limits. Hepatobiliary: Cholelithiasis without pericholecystic inflammatory change. Mild hepatic steatosis. No intra or extrahepatic biliary ductal dilation. Pancreas: Unremarkable Spleen: Unremarkable Adrenals/Urinary Tract: Moderate bilateral renal cortical atrophy. Multiple simple cortical cysts are seen within the kidneys bilaterally. No hydronephrosis. No intrarenal or ureteral calculi. The adrenal glands are unremarkable. The bladder is decompressed with a Foley catheter balloon seen within its lumen. Stomach/Bowel: Stomach, small bowel, and large bowel are unremarkable save for a few scattered descending and sigmoid colonic diverticula. The appendix is normal. No free intraperitoneal gas or fluid. Vascular/Lymphatic: Mild aortoiliac atherosclerotic calcification. No aortic aneurysm. The abdominal vasculature is otherwise unremarkable. No pathologic adenopathy within the abdomen and pelvis. Reproductive: Prostate is unremarkable. Other: Extensive subcutaneous gas noted within the left perineum and gluteal folds has improved in the interval since the prior examination. Mild residual inflammatory subcutaneous infiltration is seen within this region, best appreciated on axial image # 102/2. No discrete drainable fluid collection. Rectum unremarkable. Musculoskeletal: No lytic or blastic bone lesion is seen. IMPRESSION: No definite source identified on this non arteriographic study for the patient's reported gastrointestinal hemorrhage. Improved subcutaneous gas within the perineum and left gluteal crease. Interval Foley decompression of the bladder. Additional incidental findings as noted above. Aortic Atherosclerosis (ICD10-I70.0). Electronically Signed   By: Fidela Salisbury MD   On: 11/22/2019 01:16   CT Abdomen Pelvis W Contrast  Result Date:  11/09/2019 CLINICAL DATA:  Bowel obstruction suspected vomiting since esophageal dilation, unable to tolerate PO Increasing weakness. EXAM: CT ABDOMEN AND PELVIS WITH CONTRAST TECHNIQUE: Multidetector CT imaging of the abdomen and pelvis was performed using the standard protocol following bolus administration of intravenous contrast. CONTRAST:  74mL OMNIPAQUE IOHEXOL 300 MG/ML  SOLN COMPARISON:  CT 11 days ago 10/29/2019 FINDINGS: Lower chest: Again seen subpleural fat at the lung bases. No pleural fluid. No lower pneumomediastinum or esophageal wall thickening. No focal consolidation. Hepatobiliary: Decreased hepatic density consistent with steatosis. Previous geographic fatty distribution is more homogeneous on the current exam, likely related to differences in contrast timing. There is no discrete focal hepatic lesion. Layering gallstones without pericholecystic inflammation. There is no biliary dilatation. No visualized choledocholithiasis. Pancreas: Fatty atrophy.  No ductal dilatation or inflammation. Spleen: Normal in size without focal abnormality. Adrenals/Urinary Tract: Normal right adrenal gland. Punctate left adrenal calcification unchanged. No hydronephrosis or perinephric edema. Again seen thinning of bilateral renal parenchyma. There are bilateral renal cysts are unchanged since recent exam. The urinary bladder is distended. No bladder wall thickening. Bladder(volume = 1580 cm^3). Stomach/Bowel: No distal esophageal wall thickening. Stomach is decompressed. Normal positioning of the duodenum and ligament of Treitz. Decompressed small bowel without obstruction or inflammation. Normal appendix. Small volume of stool throughout the colon. Sigmoid colon is tortuous. Distal colonic diverticulosis without focal diverticulitis. There is no colonic wall thickening or inflammation. Vascular/Lymphatic: Aorto bi-iliac atherosclerosis without aortic aneurysm. The portal vein is patent. No acute vascular finding.  No abdominopelvic adenopathy. Reproductive: Normal sized prostate gland, see details below regarding adjacent soft tissue air. Other: There is edema and soft tissue gas involving the left perineum extending to the base of the penis. Small perineal fluid collection measures 3.4 x 2.5 cm, series 2, image 87. Tracking soft tissue air extends superiorly along the inferior margin of the prostate gland. No abdominopelvic ascites. Musculoskeletal: Multilevel degenerative change throughout the spine. Degenerative change of the hips. No bony destruction or acute osseous abnormality. IMPRESSION: 1. No bowel obstruction. 2. Midline perineal abscess measuring 3.4  x 2.5 cm just below the base of the penis anterior to the rectum, with patchy soft tissue air tracking in the left perineum, into the subcutaneous tissues. Findings highly suspicious for Fournier's gangrene. 3. Distended urinary bladder without bladder wall thickening. 4. Hepatic steatosis. Cholelithiasis without gallbladder inflammation. 5. Colonic diverticulosis without diverticulitis. Aortic Atherosclerosis (ICD10-I70.0). These results were called by telephone at the time of interpretation on 11/09/2019 at 9:17 pm to Dr Apolonio Schneiders LITTLE , who verbally acknowledged these results. Electronically Signed   By: Keith Rake M.D.   On: 11/09/2019 21:18   CT Abdomen Pelvis W Contrast  Result Date: 10/29/2019 CLINICAL DATA:  Diarrhea and shortness of breath for 6 weeks. EXAM: CT ABDOMEN AND PELVIS WITH CONTRAST TECHNIQUE: Multidetector CT imaging of the abdomen and pelvis was performed using the standard protocol following bolus administration of intravenous contrast. CONTRAST:  115mL OMNIPAQUE IOHEXOL 300 MG/ML  SOLN COMPARISON:  Abdominal ultrasound 05/19/2012, 06/05/2011 FINDINGS: Lower chest: Subpleural fat noted in the lung bases. Few bandlike areas of opacity, likely scarring or atelectasis. Lung bases are otherwise clear. Abundant mediastinal and pericardial fat  noted as well. Cardiac size is within normal limits. No pericardial effusion. Hepatobiliary: Geographic regions of hypoattenuation throughout the liver likely reflect areas of hepatic steatosis/fatty infiltration. No focal concerning liver lesion. Smooth liver surface contour. Gallbladder contains multiple dependently layering calcified gallstones. No gallbladder wall thickening, pericholecystic fluid or inflammation. No visible calcified intraductal gallstones or biliary dilatation. Pancreas: Partial fatty replacement of the pancreas. No pancreatic ductal dilatation or surrounding inflammatory changes. Spleen: No frank splenomegaly.  No concerning splenic lesions. Adrenals/Urinary Tract: Calcification of the left adrenal gland could reflect sequela of prior infection or hemorrhage. No concerning adrenal lesion. Bilateral renal atrophy/cortical thinning. Multiple fluid attenuation cysts are present in both kidneys, largest is seen in the right kidney measuring up to 7.1 cm in the upper pole. No concerning focal renal lesions. No urolithiasis or hydronephrosis. Moderate bladder distension without other gross bladder abnormality. Stomach/Bowel: Distal esophagus, stomach and duodenum are unremarkable. No small bowel thickening or dilatation. A normal appendix is visualized in the right lower quadrant. No proximal colonic thickening or dilatation. Numerous distal colonic diverticula and a segment of circumferentially thickened versus decompressed colon seen in the proximal sigmoid in a region of these diverticular but without acute pericolonic inflammation to suggest an active diverticulitis at this time. No evidence of bowel obstruction. Vascular/Lymphatic: Atherosclerotic calcifications within the abdominal aorta and branch vessels. No aneurysm or ectasia. No enlarged abdominopelvic lymph nodes. Reproductive: The prostate and seminal vesicles are unremarkable. Other: No abdominopelvic free fluid or free gas. No bowel  containing hernias. Fat containing inguinal hernias bilaterally. Small fat containing umbilical hernia. Musculoskeletal: Mild dextrocurvature of the spine. Multilevel flowing anterior osteophytosis of the lower thoracic levels, compatible with features of diffuse idiopathic skeletal hyperostosis (DISH). Additional multilevel discogenic and facet degenerative changes in the lumbar levels as well. No mild-to-moderate degenerative changes in the hips and pelvis. No acute or suspicious osseous lesions. IMPRESSION: 1. Extensive distal colonic diverticulosis. Segment of circumferentially thickened versus decompressed colon seen in the proximal sigmoid in a region of these diverticula but without acute pericolonic inflammation. May reflect sequela of prior inflammation though should consider outpatient correlation with colonoscopy if not recently performed. 2. Cholelithiasis without evidence of acute cholecystitis. 3. Geographic regions of hypoattenuation throughout the liver likely reflect areas of hepatic steatosis/fatty infiltration. 4. Bilateral renal atrophy/cortical thinning. Multiple simple appearing cysts bilaterally. 5. Aortic Atherosclerosis (ICD10-I70.0). Electronically Signed  By: Lovena Le M.D.   On: 10/29/2019 18:42   ECHOCARDIOGRAM COMPLETE  Result Date: 11/11/2019    ECHOCARDIOGRAM REPORT   Patient Name:   JARRAH BABICH  Date of Exam: 11/11/2019 Medical Rec #:  [id number]  Height:       71.0 in Accession #:    [id number] Weight:       302.9 lb Date of Birth:  10-06-1945  BSA:          2.515 m Patient Age:    15 years   BP:           98/58 mmHg Patient Gender: M          HR:           56 bpm. Exam Location:  Inpatient Procedure: 2D Echo Indications:    427.31 atrial fibrillation  History:        Patient has prior history of Echocardiogram examinations, most                 recent 10/11/2017. Risk Factors:Hypertension and Diabetes.  Sonographer:    Jannett Celestine RDCS (AE) Referring Phys: [id number] Sherryll Burger  [facility]  Sonographer Comments: Technically difficult study due to poor echo windows, no parasternal window and patient is morbidly obese. Image acquisition challenging due to patient body habitus, Image acquisition challenging due to respiratory motion and restricted mobility. IMPRESSIONS  1. Left ventricular ejection fraction, by estimation, is 60 to 65%. The left ventricle has normal function. The left ventricle has no regional wall motion abnormalities. Left ventricular diastolic parameters are consistent with Grade II diastolic dysfunction (pseudonormalization).  2. Right ventricular systolic function is normal. The right ventricular size is normal.  3. The mitral valve is normal in structure. No evidence of mitral valve regurgitation. No evidence of mitral stenosis.  4. The aortic valve is normal in structure. Aortic valve regurgitation is not visualized. No aortic stenosis is present.  5. The inferior vena cava is normal in size with greater than 50% respiratory variability, suggesting right atrial pressure of 3 mmHg. FINDINGS  Left Ventricle: Left ventricular ejection fraction, by estimation, is 60 to 65%. The left ventricle has normal function. The left ventricle has no regional wall motion abnormalities. Definity contrast agent was given IV to delineate the left ventricular  endocardial borders. The left ventricular internal cavity size was normal in size. There is no left ventricular hypertrophy. Left ventricular diastolic parameters are consistent with Grade II diastolic dysfunction (pseudonormalization). Right Ventricle: The right ventricular size is normal. No increase in right ventricular wall thickness. Right ventricular systolic function is normal. Left Atrium: Left atrial size was not well visualized. Right Atrium: Right atrial size was normal in size. Pericardium: There is no evidence of pericardial effusion. Mitral Valve: The mitral valve is normal in structure. No evidence of mitral valve  regurgitation. No evidence of mitral valve stenosis. Tricuspid Valve: The tricuspid valve is not well visualized. Tricuspid valve regurgitation is trivial. No evidence of tricuspid stenosis. Aortic Valve: The aortic valve is normal in structure. Aortic valve regurgitation is not visualized. No aortic stenosis is present. Pulmonic Valve: The pulmonic valve was normal in structure. Pulmonic valve regurgitation is not visualized. No evidence of pulmonic stenosis. Aorta: The aortic root is normal in size and structure. Venous: The inferior vena cava is normal in size with greater than 50% respiratory variability, suggesting right atrial pressure of 3 mmHg. IAS/Shunts: No atrial level shunt detected by color flow Doppler.   Diastology LV e'  medial:    5.44 cm/s LV E/e' medial:  17.2 LV e' lateral:   6.53 cm/s LV E/e' lateral: 14.4  AORTIC VALVE LVOT Vmax:   58.10 cm/s LVOT Vmean:  42.500 cm/s LVOT VTI:    0.123 m MITRAL VALVE MV Area (PHT): 2.26 cm    SHUNTS MV Decel Time: 335 msec    Systemic VTI: 0.12 m MV E velocity: 93.80 cm/s MV A velocity: 38.60 cm/s MV E/A ratio:  2.43 Candee Furbish MD Electronically signed by Candee Furbish MD Signature Date/Time: 11/11/2019/3:31:15 PM    Final    VAS [place] LOWER EXTREMITY VENOUS (DVT)  Result Date: 11/12/2019  Lower Venous DVTStudy Indications: Chronic bilateral calf pain.  Limitations: Body habitus, poor ultrasound/tissue interface and patient discomfort. Comparison Study: No prior study Performing Technologist: Maudry Mayhew MHA, RDMS, RVT, RDCS  Examination Guidelines: A complete evaluation includes B-mode imaging, spectral Doppler, color Doppler, and power Doppler as needed of all accessible portions of each vessel. Bilateral testing is considered an integral part of a complete examination. Limited examinations for reoccurring indications may be performed as noted. The reflux portion of the exam is performed with the patient in reverse Trendelenburg.   +---------+---------------+---------+-----------+----------+--------------+ RIGHT    CompressibilityPhasicitySpontaneityPropertiesThrombus Aging +---------+---------------+---------+-----------+----------+--------------+ FV Prox  Full                    Yes        Patent                   +---------+---------------+---------+-----------+----------+--------------+ FV Mid                           Yes                                 +---------+---------------+---------+-----------+----------+--------------+ FV DistalFull                                                        +---------+---------------+---------+-----------+----------+--------------+ PFV      Full                    Yes                                 +---------+---------------+---------+-----------+----------+--------------+ POP      Full           No       Yes                                 +---------+---------------+---------+-----------+----------+--------------+ PTV      Full                    Yes                                 +---------+---------------+---------+-----------+----------+--------------+ PERO     Full                    Yes                                 +---------+---------------+---------+-----------+----------+--------------+  Right Technical Findings: Not visualized segments include SFJ, CFV.  +---------+---------------+---------+-----------+----------+--------------+ LEFT     CompressibilityPhasicitySpontaneityPropertiesThrombus Aging +---------+---------------+---------+-----------+----------+--------------+ CFV      Full           No       Yes                                 +---------+---------------+---------+-----------+----------+--------------+ FV Prox  Full                                                        +---------+---------------+---------+-----------+----------+--------------+ FV Mid   Full                                                         +---------+---------------+---------+-----------+----------+--------------+ FV Distal                        Yes                                 +---------+---------------+---------+-----------+----------+--------------+ POP      Full           No       Yes                                 +---------+---------------+---------+-----------+----------+--------------+ PTV      Full                                                        +---------+---------------+---------+-----------+----------+--------------+ PERO     Full                                                        +---------+---------------+---------+-----------+----------+--------------+   Left Technical Findings: Not visualized segments include SFJ, PFV, limited evaluation of PTV and peroneal veins.   Summary: RIGHT: - There is no evidence of deep vein thrombosis in the lower extremity. However, portions of this examination were limited- see technologist comments above.  - No cystic structure found in the popliteal fossa.  LEFT: - There is no evidence of deep vein thrombosis in the lower extremity. However, portions of this examination were limited- see technologist comments above.  - No cystic structure found in the popliteal fossa.  Bilateral lower extremity venous flow is pulsatile, suggestive of possibly elevated right heart pressure.  *See table(s) above for measurements and observations. Electronically signed by Monica Martinez MD on 11/12/2019 at 5:07:25 PM.    Final    CT Angio Abd/Pel w/ and/or w/o  Result Date: 11/22/2019 CLINICAL DATA:  GI bleed, rectal bleeding, hematuria.  On Eliquis EXAM: CTA ABDOMEN AND PELVIS  WITHOUT AND WITH CONTRAST TECHNIQUE: Multidetector CT imaging of the abdomen and pelvis was performed using the standard protocol during bolus administration of intravenous contrast. Multiplanar reconstructed images and MIPs were obtained and reviewed to evaluate the vascular  anatomy. CONTRAST:  125mL OMNIPAQUE IOHEXOL 350 MG/ML SOLN COMPARISON:  Diagnostic CT of the same day, and earlier studies FINDINGS: VASCULAR Aorta: Mild scattered calcified atheromatous plaque. No aneurysm, dissection, or stenosis. Celiac: Mild short-segment narrowing at the level of the median arcuate ligament of the diaphragm, widely patent distally. SMA: Patent without evidence of aneurysm, dissection, vasculitis or significant stenosis. Renals: Both renal arteries are patent without evidence of aneurysm, dissection, vasculitis, fibromuscular dysplasia or significant stenosis. IMA: Patent without evidence of aneurysm, dissection, vasculitis or significant stenosis. Inflow: Minimal scattered calcified plaque. No aneurysm, dissection, or stenosis. Proximal Outflow: Bilateral common femoral and visualized portions of the superficial and profunda femoral arteries are patent without evidence of aneurysm, dissection, vasculitis or significant stenosis. Veins: Patent hepatic veins, portal vein, SMV, splenic vein, bilateral renal veins. Iliac venous system and IVC unremarkable. No venous pathology evident. Review of the MIP images confirms the above findings. NON-VASCULAR Lower chest: Small bilateral pleural effusions as before. Coarse calcifications on aortic valve leaflets. Hepatobiliary: Innumerable subcentimeter partially calcified stones layer in the dependent aspect of the nondilated gallbladder. No focal liver lesion. Pancreas: Unremarkable. No pancreatic ductal dilatation or surrounding inflammatory changes. Spleen: Normal in size without focal abnormality. Adrenals/Urinary Tract: Adrenal glands unremarkable. Renal cysts as before. No hydronephrosis. Urinary bladder decompressed by Foley catheter. Stomach/Bowel: Stomach is nondistended. Small bowel decompressed. Normal appendix. The colon is nondilated with a few scattered descending and sigmoid diverticula. No evidence of active extravasation into the bowel  lumen. Lymphatic: No abdominal or pelvic adenopathy. Reproductive: Prostate is unremarkable. Other: No ascites.  Right pelvic phlebolith.  No free air. Musculoskeletal: Small periumbilical hernia containing only mesenteric fat. Anterior vertebral endplate spurring at multiple levels in the lower thoracic spine. Multilevel lumbar spondylitic change. IMPRESSION: 1. No evidence of active extravasation into the bowel lumen. 2. Descending and sigmoid diverticulosis. 3. Small bilateral pleural effusions as before. 4. Cholelithiasis. 5. Small periumbilical hernia containing only mesenteric fat. Aortic Atherosclerosis (ICD10-I70.0). Electronically Signed   By: Lucrezia Europe M.D.   On: 11/22/2019 15:32    Microbiology: Recent Results (from the past 240 hour(s))  Respiratory Panel by RT PCR (Flu A&B, Covid) - Nasopharyngeal Swab     Status: None   Collection Time: 11/17/19 11:29 AM   Specimen: Nasopharyngeal Swab  Result Value Ref Range Status   SARS Coronavirus 2 by RT PCR NEGATIVE NEGATIVE Final    Comment: (NOTE) SARS-CoV-2 target nucleic acids are NOT DETECTED.  The SARS-CoV-2 RNA is generally detectable in upper respiratoy specimens during the acute phase of infection. The lowest concentration of SARS-CoV-2 viral copies this assay can detect is 131 copies/mL. A negative result does not preclude SARS-Cov-2 infection and should not be used as the sole basis for treatment or other patient management decisions. A negative result may occur with  improper specimen collection/handling, submission of specimen other than nasopharyngeal swab, presence of viral mutation(s) within the areas targeted by this assay, and inadequate number of viral copies (<131 copies/mL). A negative result must be combined with clinical observations, patient history, and epidemiological information. The expected result is Negative.  Fact Sheet for Patients:  PinkCheek.be  Fact Sheet for Healthcare  Providers:  GravelBags.it  This test is no t yet approved or cleared by the [place]  FDA and  has been authorized for detection and/or diagnosis of SARS-CoV-2 by FDA under an Emergency Use Authorization (EUA). This EUA will remain  in effect (meaning this test can be used) for the duration of the COVID-19 declaration under Section 564(b)(1) of the Act, 21 U.S.C. section 360bbb-3(b)(1), unless the authorization is terminated or revoked sooner.     Influenza A by PCR NEGATIVE NEGATIVE Final   Influenza B by PCR NEGATIVE NEGATIVE Final    Comment: (NOTE) The Xpert Xpress SARS-CoV-2/FLU/RSV assay is intended as an aid in  the diagnosis of influenza from Nasopharyngeal swab specimens and  should not be used as a sole basis for treatment. Nasal washings and  aspirates are unacceptable for Xpert Xpress SARS-CoV-2/FLU/RSV  testing.  Fact Sheet for Patients: PinkCheek.be  Fact Sheet for Healthcare Providers: GravelBags.it  This test is not yet approved or cleared by the [place] FDA and  has been authorized for detection and/or diagnosis of SARS-CoV-2 by  FDA under an Emergency Use Authorization (EUA). This EUA will remain  in effect (meaning this test can be used) for the duration of the  Covid-19 declaration under Section 564(b)(1) of the Act, 21  U.S.C. section 360bbb-3(b)(1), unless the authorization is  terminated or revoked. Performed at [facility], [place] [address] [place], [place] [address]   Respiratory Panel by RT PCR (Flu A&B, Covid) - Nasopharyngeal Swab     Status: None   Collection Time: 11/24/19  1:56 PM   Specimen: Nasopharyngeal Swab  Result Value Ref Range Status   SARS Coronavirus 2 by RT PCR NEGATIVE NEGATIVE Final    Comment: (NOTE) SARS-CoV-2 target nucleic acids are NOT DETECTED.  The SARS-CoV-2 RNA is generally detectable in upper  respiratoy specimens during the acute phase of infection. The lowest concentration of SARS-CoV-2 viral copies this assay can detect is 131 copies/mL. A negative result does not preclude SARS-Cov-2 infection and should not be used as the sole basis for treatment or other patient management decisions. A negative result may occur with  improper specimen collection/handling, submission of specimen other than nasopharyngeal swab, presence of viral mutation(s) within the areas targeted by this assay, and inadequate number of viral copies (<131 copies/mL). A negative result must be combined with clinical observations, patient history, and epidemiological information. The expected result is Negative.  Fact Sheet for Patients:  PinkCheek.be  Fact Sheet for Healthcare Providers:  GravelBags.it  This test is no t yet approved or cleared by the [place] FDA and  has been authorized for detection and/or diagnosis of SARS-CoV-2 by FDA under an Emergency Use Authorization (EUA). This EUA will remain  in effect (meaning this test can be used) for the duration of the COVID-19 declaration under Section 564(b)(1) of the Act, 21 U.S.C. section 360bbb-3(b)(1), unless the authorization is terminated or revoked sooner.     Influenza A by PCR NEGATIVE NEGATIVE Final   Influenza B by PCR NEGATIVE NEGATIVE Final    Comment: (NOTE) The Xpert Xpress SARS-CoV-2/FLU/RSV assay is intended as an aid in  the diagnosis of influenza from Nasopharyngeal swab specimens and  should not be used as a sole basis for treatment. Nasal washings and  aspirates are unacceptable for Xpert Xpress SARS-CoV-2/FLU/RSV  testing.  Fact Sheet for Patients: PinkCheek.be  Fact Sheet for Healthcare Providers: GravelBags.it  This test is not yet approved or cleared by the [place] FDA and  has been  authorized for detection and/or diagnosis of SARS-CoV-2 by  FDA under  an Emergency Use Authorization (EUA). This EUA will remain  in effect (meaning this test can be used) for the duration of the  Covid-19 declaration under Section 564(b)(1) of the Act, 21  U.S.C. section 360bbb-3(b)(1), unless the authorization is  terminated or revoked. Performed at [facility], [place] [address] [place], [place] [address]      Labs: Basic Metabolic Panel: Recent Labs  Lab 11/21/19 2255 11/22/19 1413 11/23/19 0056 11/24/19 0058 11/25/19 0919  NA 139 138 138 138 136  K 3.2* 3.2* 4.4 3.7 3.8  CL 103 106 104 104 102  CO2 24 23 22  21* 19*  GLUCOSE 142* 145* 115* 112* 98  BUN 7* 6* 6* 9 9  CREATININE 0.98 0.90 0.85 0.92 0.80  CALCIUM 8.8* 8.2* 8.3* 8.6* 8.5*   Liver Function Tests: Recent Labs  Lab 11/21/19 2255 11/22/19 1413  AST 22 20  ALT 13 12  ALKPHOS 66 53  BILITOT 1.2 0.9  PROT 5.9* 5.3*  ALBUMIN 2.9* 2.6*   No results for input(s): LIPASE, AMYLASE in the last 168 hours. No results for input(s): AMMONIA in the last 168 hours. CBC: Recent Labs  Lab 11/21/19 2255 11/22/19 0500 11/23/19 0056 11/23/19 0703 11/23/19 1254 11/23/19 1827 11/24/19 0058 11/24/19 0743 11/25/19 0919  WBC 7.3  --  6.5  --   --   --  6.6  --  5.9  NEUTROABS 5.4  --  4.7  --   --   --  4.7  --  4.0  HGB 15.0   < > 12.7*   < > 15.0 12.8* 12.2* 11.8* 12.8*  HCT 45.2  --  38.7*  --   --   --  37.1*  --  39.2  MCV 94.2  --  97.0  --   --   --  97.1  --  98.0  PLT 254  --  242  --   --   --  244  --  223   < > = values in this interval not displayed.   Cardiac Enzymes: No results for input(s): CKTOTAL, CKMB, CKMBINDEX, TROPONINI in the last 168 hours. BNP: BNP (last 3 results) No results for input(s): BNP in the last 8760 hours.  ProBNP (last 3 results) No results for input(s): PROBNP in the last 8760 hours.  CBG: Recent Labs  Lab 11/24/19 1154 11/24/19 1600 11/24/19 2023  11/25/19 0731 11/25/19 1146  GLUCAP 125* 106* 120* 90 114*       Signed:  Florencia Reasons MD, PhD, FACP  [facility] 11/25/2019, 3:16 PM

## 2019-11-25 NOTE — Care Management Important Message (Signed)
Important Message  Patient Details IM Letter given to the Patient Name: Gary Butler MRN: [id number] Date of Birth: 1945/12/18   Medicare Important Message Given:  Yes     Kerin Salen 11/25/2019, 10:51 AM

## 2019-11-25 NOTE — Progress Notes (Signed)
Patient has not voided since In and out cath performed earlier this am. Bladder scan revealed approx 143 ml but pt has large body habitus. Voices no "urge" to void. In and out attempted with resistance met and much discomfort to patient to the point of yelling during procedure. Not able to advance far enough to get urine back. Md notified.Eulas Post, RN

## 2019-11-25 NOTE — Progress Notes (Signed)
General Surgery  Patient examined at bedside due to concern for bleeding from perianal I&D site. On exam the wound is clean and dry with minimal serous drainage, but no frank blood. There is no active bleeding. Wound was repacked with clean gauze. - Ok to resume eliquis - Continue packing wound, change daily - Patient has follow up scheduled in our clinic on 10/21 at 2:30pm  Michaelle Birks, [facility] 11/25/19 10:31 AM

## 2019-11-25 NOTE — Progress Notes (Signed)
Pt. seen for BiPAP placement, remains on room air, and aware to notify if help needed this evening with placement.

## 2019-11-25 NOTE — Progress Notes (Signed)
I/O catheterization completed per orders, output of 877mL of clear amber urine obtained. Patient was very tender, tolerated but verbalized pain regarding being catheterized. Patient was also frustrated by repeated bladder scans. 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

## 2019-11-25 NOTE — Progress Notes (Signed)
Report called to Catalina Lunger, nurse at [facility] and rehab. Awaiting transport.Eulas Post, RN

## 2019-11-26 ENCOUNTER — Telehealth: Payer: Self-pay | Admitting: Cardiovascular Disease

## 2019-11-26 NOTE — Telephone Encounter (Signed)
Shenekia called from [place] and wanted to let Dr. Claiborne Billings know that the patient is currently refusing to wear monitor. Wife talked to patient and now he is thinking about it overnight. Wants call back if any advice.

## 2019-11-26 NOTE — Telephone Encounter (Signed)
Noted, will make ordering provider aware.

## 2019-11-27 ENCOUNTER — Non-Acute Institutional Stay (SKILLED_NURSING_FACILITY): Payer: Medicare PPO | Admitting: Internal Medicine

## 2019-11-27 ENCOUNTER — Encounter: Payer: Self-pay | Admitting: Internal Medicine

## 2019-11-27 DIAGNOSIS — R338 Other retention of urine: Secondary | ICD-10-CM

## 2019-11-27 DIAGNOSIS — G4733 Obstructive sleep apnea (adult) (pediatric): Secondary | ICD-10-CM | POA: Diagnosis not present

## 2019-11-27 DIAGNOSIS — I5032 Chronic diastolic (congestive) heart failure: Secondary | ICD-10-CM | POA: Diagnosis not present

## 2019-11-27 DIAGNOSIS — K611 Rectal abscess: Secondary | ICD-10-CM

## 2019-11-27 DIAGNOSIS — D6869 Other thrombophilia: Secondary | ICD-10-CM

## 2019-11-27 DIAGNOSIS — I4891 Unspecified atrial fibrillation: Secondary | ICD-10-CM

## 2019-11-27 DIAGNOSIS — E1165 Type 2 diabetes mellitus with hyperglycemia: Secondary | ICD-10-CM

## 2019-11-27 DIAGNOSIS — K5790 Diverticulosis of intestine, part unspecified, without perforation or abscess without bleeding: Secondary | ICD-10-CM

## 2019-11-27 DIAGNOSIS — K921 Melena: Secondary | ICD-10-CM

## 2019-11-27 DIAGNOSIS — I480 Paroxysmal atrial fibrillation: Secondary | ICD-10-CM

## 2019-11-27 DIAGNOSIS — R5381 Other malaise: Secondary | ICD-10-CM

## 2019-11-27 DIAGNOSIS — Z9989 Dependence on other enabling machines and devices: Secondary | ICD-10-CM

## 2019-11-27 MED ORDER — MELATONIN 3 MG PO CAPS
3.0000 mg | ORAL_CAPSULE | Freq: Every evening | ORAL | 0 refills | Status: DC | PRN
Start: 2019-11-27 — End: 2019-12-18

## 2019-11-27 NOTE — Progress Notes (Signed)
Provider:  Rexene Edison. Mariea Clonts, D.O., C.M.D. Location:  [facility] Room Number: [place]:  SNF (31)  PCP: [name], MD Patient Care Team: [name], MD as PCP - General (Family Medicine) Troy Sine, MD as PCP - Cardiology (Cardiology) [place], Jennefer Bravo, MD as Referring Physician (Dermatology) Sharyne Peach, MD as Consulting Physician (Ophthalmology) Hennie Duos, MD as Consulting Physician (Rheumatology) Kathie Rhodes, MD as Consulting Physician (Urology) Tanda Rockers, MD as Consulting Physician (Pulmonary Disease) Troy Sine, MD as Consulting Physician (Cardiology) Paralee Cancel, MD as Consulting Physician (Orthopedic Surgery) Cameron Sprang, MD as Consulting Physician (Neurology) Marzetta Board, DPM as Consulting Physician (Podiatry) Renelda Mom, MD as Referring Physician (Colon and Rectal Surgery) Richmond Campbell, MD as Consulting Physician (Gastroenterology) Rolene Course, PA-C as Physician Assistant (Internal Medicine)  Extended Emergency Contact Information Primary Emergency Contact: Kalla,Susan Address: [address]          [place], [place] [address] Johnnette Litter of [place] Phone: [phone] Mobile Phone: [phone] Relation: Spouse  Code Status: FULL CODE Goals of Care: Advanced Directive information Advanced Directives 11/27/2019  Does Patient Have a Medical Advance Directive? No  Type of Advance Directive -  Does patient want to make changes to medical advance directive? No - Patient declined  Copy of [place] in Chart? -  Would patient like information on creating a medical advance directive? -   Chief Complaint  Patient presents with  . Readmit To SNF    Readmission s/p GI bleed    HPI: Patient is a 74 y.o. male seen today for readmission to [facility] status post hospitalization from October 9 through October 13 after  he had an episode of bright red blood and clots per rectum.  He had a second episode of hematochezia with clots while in the emergency department.  He had originally been staying with [place] after hospitalization from September 27 through October 5 for perirectal abscess and had required surgical incision and drainage.  He continued to have a Foley in place due to urinary retention.  He has a medical history significant for type 2 diabetes, obstructive sleep apnea on CPAP, obesity, hypertension, and new onset A. fib during that last hospitalization when he was started on Eliquis.  His presenting hemoglobin October 9 was 15.  Eliquis was stopped at that time, he was given normal saline IV fluids and his hemoglobin was monitored every 6 hours.  He had a CT of his abdomen and pelvis with decreased emphysema in the perineal tissue but no source of bleeding identified.  On October 10 and 11 general surgery evaluated the perirectal incision site which was clean and only had a scant amount of bruising from the wound surfaces.  His Eliquis was eventually resumed.  He apparently had no further bleeding during the stay.  He is to keep his urology follow-up with Dr. Claudia Desanctis regarding his urinary retention.  He returns here to complete his physical and occupational therapy.  While at the hospital he completed his Keflex and Flagyl so that is no longer needed.  He is also to follow-up October 21 with general surgery and at some point with cardiology, Dr. Claiborne Billings.  He has had his [place] vaccines February 8 and March 4 of this year.  When seen, he had just put his CPAP on and was resting after not sleeping well at the hospital or last night.  He had requested something to help him sleep at night.  Past Medical History:  Diagnosis Date  . Arthritis   . CHF (congestive heart failure) ([place])   . Constipation   . Diabetes mellitus   . Heart murmur   . History of gout   . History of skin cancer   . Hypertension   . Morbid  (severe) obesity due to excess calories ([place])   . OSA (obstructive sleep apnea)   . Psoriatic arthritis ([place])   . Rectal fissure   . Sleep apnea   . Tinnitus    Past Surgical History:  Procedure Laterality Date  . addenoids    . ANAL FISTULOTOMY  2019   Dr Drue Flirt, [facility]  . IRRIGATION AND DEBRIDEMENT ABSCESS N/A 11/10/2019   Procedure: INCISION AND DRAINAGE OF HIGH SCROTAL ABSCESS;  Surgeon: Michael Boston, MD;  Location: [facility];  Service: General;  Laterality: N/A;  . JOINT REPLACEMENT  2012   RT KNEE  . LUNG BIOPSY     20 YRS AGO  . RECTAL EXAM UNDER ANESTHESIA N/A 11/10/2019   Procedure: ANORECTAL EXAM UNDER ANESTHESIA;  Surgeon: Michael Boston, MD;  Location: [facility];  Service: General;  Laterality: N/A;  . TONSILLECTOMY    . TOTAL KNEE ARTHROPLASTY Bilateral 06/22/2013   Procedure: LEFT TOTAL KNEE ARTHROPLASTY;  MEDIAL SOFT TISSUE EXPLORATION SAPHENOUS NEURECTOMY RIGHT KNEE;  Surgeon: Mauri Pole, MD;  Location: [facility];  Service: Orthopedics;  Laterality: Bilateral;    Social History   Socioeconomic History  . Marital status: Married    Spouse name: Not on file  . Number of children: 2  . Years of education: Not on file  . Highest education level: Not on file  Occupational History  . 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 Smoking status: Never Smoker  . Smokeless tobacco: Never Used  Vaping Use  . Vaping Use: Never used  Substance and Sexual Activity  . Alcohol use: Yes    Alcohol/week: 0.0 standard drinks    Comment: Once a month  . Drug use: No  . Sexual activity: Never  Other Topics Concern  . Not on file  Social History Narrative   Alcohol- 1 to 2 per month.    Social Determinants of Health   Financial Resource Strain:   . Difficulty of Paying Living Expenses: Not on file  Food Insecurity:   . Worried About Charity fundraiser in the Last Year: Not on file  . Ran Out of Food in the Last Year:  Not on file  Transportation Needs:   . Lack of Transportation (Medical): Not on file  . Lack of Transportation (Non-Medical): Not on file  Physical Activity:   . Days of Exercise per Week: Not on file  . Minutes of Exercise per Session: Not on file  Stress:   . Feeling of Stress : Not on file  Social Connections:   . Frequency of Communication with Friends and Family: Not on file  . Frequency of Social Gatherings with Friends and Family: Not on file  . Attends Religious Services: Not on file  . Active Member of Clubs or Organizations: Not on file  . Attends Archivist Meetings: Not on file  . Marital Status: Not on file    reports that he has never smoked. He has never used smokeless tobacco. He reports current alcohol use. He reports that he does  not use drugs.  Functional Status Survey:    Family History  Problem Relation Age of Onset  . Emphysema Father   . Clotting disorder Father   . Arthritis Father   . Heart disease Mother   . Arthritis Mother   . Arthritis Maternal Grandmother   . Hypertension Maternal Grandmother   . Diabetes Maternal Grandmother   . Arthritis Paternal Grandmother   . Diabetes Maternal Aunt   . Diabetes Paternal Aunt     Health Maintenance  Topic Date Due  . Hepatitis C Screening  04/14/2020 (Originally [birth year])  . FOOT EXAM  02/02/2020  . HEMOGLOBIN A1C  04/24/2020  . OPHTHALMOLOGY EXAM  08/24/2020  . TETANUS/TDAP  02/18/2024  . COLONOSCOPY  08/07/2027  . INFLUENZA VACCINE  Completed  . COVID-19 Vaccine  Completed  . PNA vac Low Risk Adult  Completed    Allergies  Allergen Reactions  . Penicillins Hives    Patient reports full body hives that required medical treatment when he was in his 66s or 28s. He tolerated cephalosporins.   [name] [Pregabalin] Swelling  . Levofloxacin Swelling    Outpatient Encounter Medications as of 11/27/2019  Medication Sig  . Albuterol Sulfate (PROAIR RESPICLICK) 361 (90 Base) MCG/ACT AEPB  Inhale 2 puffs into the lungs every 6 (six) hours as needed (for wheezing or shortness of breath).  Marland Kitchen allopurinol (ZYLOPRIM) 300 MG tablet TAKE 1 TABLET(300 MG) BY MOUTH DAILY (Patient taking differently: 300 mg daily. )  . apixaban (ELIQUIS) 5 MG TABS tablet Take 1 tablet (5 mg total) by mouth 2 (two) times daily.  Marland Kitchen aspirin 81 MG tablet Take 81 mg by mouth daily.  Marland Kitchen atorvastatin (LIPITOR) 40 MG tablet TAKE 1 TABLET(40 MG) BY MOUTH EVERY EVENING (Patient taking differently: Take 40 mg by mouth daily. )  . bisacodyl (DULCOLAX) 10 MG suppository Place 10 mg rectally as needed for moderate constipation (if not relieved by MOM).   . bisoprolol (ZEBETA) 5 MG tablet Take 0.5 tablets (2.5 mg total) by mouth daily.  . cetirizine (ZYRTEC) 10 MG tablet TAKE 1 TABLET(10 MG) BY MOUTH DAILY (Patient taking differently: Take 10 mg by mouth daily. TAKE 1 TABLET(10 MG) BY MOUTH DAILY)  . Cholecalciferol (VITAMIN D) 2000 UNITS tablet Take 2,000 Units by mouth daily.  . Cyanocobalamin (B-12) 2000 MCG TABS Take 2,000 mcg by mouth daily.   Marland Kitchen donepezil (ARICEPT) 10 MG tablet TAKE 1 TABLET(10 MG) BY MOUTH AT BEDTIME  . empagliflozin (JARDIANCE) 10 MG TABS tablet Take 10 mg by mouth daily before breakfast.  . feeding supplement, GLUCERNA SHAKE, (GLUCERNA SHAKE) LIQD Take 237 mLs by mouth daily.  . fluocinonide cream (LIDEX) 4.43 % Apply 1 application topically 2 (two) times daily. To arms for psoriasis (Patient not taking: Reported on 11/22/2019)  . fluticasone (FLONASE) 50 MCG/ACT nasal spray Place 2 sprays into both nostrils daily. (Patient not taking: Reported on 11/22/2019)  . folic acid (FOLVITE) 1 MG tablet Take 1 mg by mouth daily.   . furosemide (LASIX) 20 MG tablet TAKE 1 TABLET(20 MG) BY MOUTH DAILY (Patient taking differently: Take 20 mg by mouth daily. TAKE 1 TABLET(20 MG) BY MOUTH DAILY)  . magnesium hydroxide (MILK OF MAGNESIA) 400 MG/5ML suspension Take 30 mLs by mouth daily as needed for mild  constipation. If no bowel movement in 3 days  . meloxicam (MOBIC) 15 MG tablet Take 1 tablet by mouth daily. (Patient taking differently: Take 15 mg by mouth daily. Take 1 tablet by  mouth daily.)  . omeprazole (PRILOSEC) 40 MG capsule TAKE 1 CAPSULE BY MOUTH 30 TO 60 MINUTES BEFORE YOUR FIRST AND LAST MEAL OF THE DAY (Patient taking differently: Take 40 mg by mouth in the morning and at bedtime. TAKE 1 CAPSULE BY MOUTH 30 TO 60 MINUTES BEFORE YOUR FIRST AND LAST MEAL OF THE DAY)  . OVER THE COUNTER MEDICATION Take 1 Bottle by mouth in the morning and at bedtime. Magic Cup  . polyethylene glycol (MIRALAX / GLYCOLAX) 17 g packet Take 17 g by mouth daily as needed for mild constipation. (Patient taking differently: Take 17 g by mouth daily as needed for mild constipation (if not relieved by saline enema). )  . potassium chloride (MICRO-K) 10 MEQ CR capsule TAKE 1 CAPSULE BY MOUTH EVERY DAY (Patient taking differently: Take 10 mEq by mouth daily. TAKE 1 CAPSULE BY MOUTH EVERY DAY)  . psyllium (HYDROCIL/METAMUCIL) 95 % PACK Take 1 packet by mouth 2 (two) times daily.  . sertraline (ZOLOFT) 50 MG tablet TAKE 1 TABLET(50 MG) BY MOUTH DAILY. (Patient taking differently: Take 50 mg by mouth daily. TAKE 1 TABLET(50 MG) BY MOUTH DAILY.)  . Sodium Phosphates (RA SALINE ENEMA RE) Place 1 Bottle rectally daily as needed (constipation if not relieved by biscodyl supp.).  Marland Kitchen tamsulosin (FLOMAX) 0.4 MG CAPS capsule Take 1 capsule (0.4 mg total) by mouth daily after supper.  Marland Kitchen tiZANidine (ZANAFLEX) 4 MG tablet Take 1 tablet (4 mg total) by mouth every 6 (six) hours as needed for muscle spasms.   No facility-administered encounter medications on file as of 11/27/2019.    Review of Systems  Constitutional: Positive for malaise/fatigue. Negative for chills and fever.  HENT: Negative for congestion and sore throat.   Eyes: Negative for blurred vision.  Respiratory: Negative for cough and shortness of breath.    Cardiovascular: Negative for chest pain and palpitations.  Gastrointestinal: Negative for abdominal pain, blood in stool and melena.  Genitourinary: Negative for dysuria.  Musculoskeletal: Positive for joint pain and neck pain. Negative for falls.       Knees  Skin: Negative for itching and rash.       psoriasis  Neurological: Positive for weakness. Negative for dizziness and loss of consciousness.  Endo/Heme/Allergies: Bruises/bleeds easily.  Psychiatric/Behavioral: Positive for memory loss. Negative for depression. The patient has insomnia. The patient is not nervous/anxious.     Vitals:   11/27/19 1236  BP: 100/69  Pulse: 63  Temp: 98.1 F (36.7 C)  Weight: 290 lb 14.4 oz (132 kg)  Height: 5\' 11"  (1.803 m)   Body mass index is 40.57 kg/m. Physical Exam Constitutional:      General: He is not in acute distress.    Appearance: Normal appearance. He is obese. He is not toxic-appearing.  HENT:     Head: Normocephalic and atraumatic.     Right Ear: External ear normal.     Left Ear: External ear normal.     Nose: Nose normal.     Mouth/Throat:     Pharynx: Oropharynx is clear.  Eyes:     Extraocular Movements: Extraocular movements intact.     Conjunctiva/sclera: Conjunctivae normal.     Pupils: Pupils are equal, round, and reactive to light.  Cardiovascular:     Rate and Rhythm: Normal rate and regular rhythm.     Pulses: Normal pulses.     Heart sounds: Normal heart sounds.  Pulmonary:     Effort: Pulmonary effort is normal.  Breath sounds: Normal breath sounds. No rales.  Abdominal:     General: Bowel sounds are normal.     Tenderness: There is no abdominal tenderness. There is no guarding or rebound.  Musculoskeletal:        General: Normal range of motion.     Cervical back: Neck supple.     Right lower leg: Edema present.     Left lower leg: Edema present.  Lymphadenopathy:     Cervical: No cervical adenopathy.  Skin:    General: Skin is warm and dry.      Comments: Dry scaly patches on arms  Neurological:     General: No focal deficit present.     Mental Status: Mental status is at baseline.     Motor: Weakness present.  Psychiatric:        Mood and Affect: Mood normal.     Labs reviewed: Basic Metabolic Panel: Recent Labs    11/10/19 0958 11/11/19 0647 11/12/19 0605 11/12/19 0605 11/13/19 0549 11/21/19 2255 11/23/19 0056 11/24/19 0058 11/25/19 0919  NA 130*   < > 132*   < > 132*   < > 138 138 136  K 3.3*   < > 3.8   < > 4.0   < > 4.4 3.7 3.8  CL 96*   < > 101   < > 103   < > 104 104 102  CO2 23   < > 21*   < > 16*   < > 22 21* 19*  GLUCOSE 231*   < > 133*   < > 127*   < > 115* 112* 98  BUN 18   < > 18   < > 15   < > 6* 9 9  CREATININE 1.22   < > 1.09   < > 0.98   < > 0.85 0.92 0.80  CALCIUM 8.7*   < > 8.8*   < > 8.3*   < > 8.3* 8.6* 8.5*  MG 1.8  --  1.9  --  1.8  --   --   --   --    < > = values in this interval not displayed.   Liver Function Tests: Recent Labs    11/11/19 0647 11/21/19 2255 11/22/19 1413  AST 16 22 20   ALT 12 13 12   ALKPHOS 70 66 53  BILITOT 1.2 1.2 0.9  PROT 5.4* 5.9* 5.3*  ALBUMIN 2.6* 2.9* 2.6*   Recent Labs    10/29/19 1659 11/09/19 1833  LIPASE 21 22   No results for input(s): AMMONIA in the last 8760 hours. CBC: Recent Labs    11/23/19 0056 11/23/19 0703 11/24/19 0058 11/24/19 0743 11/25/19 0919  WBC 6.5  --  6.6  --  5.9  NEUTROABS 4.7  --  4.7  --  4.0  HGB 12.7*   < > 12.2* 11.8* 12.8*  HCT 38.7*  --  37.1*  --  39.2  MCV 97.0  --  97.1  --  98.0  PLT 242  --  244  --  223   < > = values in this interval not displayed.   Cardiac Enzymes: No results for input(s): CKTOTAL, CKMB, CKMBINDEX, TROPONINI in the last 8760 hours. BNP: Invalid input(s): POCBNP Lab Results  Component Value Date   HGBA1C 9.1 (H) 10/26/2019   Lab Results  Component Value Date   TSH 1.141 11/10/2019   Lab Results  Component Value Date   VITAMINB12 238 04/29/2014  Lab Results   Component Value Date   FOLATE >23.3 04/29/2014   No results found for: IRON, TIBC, FERRITIN  Imaging and Procedures obtained prior to SNF admission: CT ABDOMEN PELVIS W CONTRAST  Result Date: 11/22/2019 CLINICAL DATA:  Chronic anticoagulation, rectal bleeding EXAM: CT ABDOMEN AND PELVIS WITH CONTRAST TECHNIQUE: Multidetector CT imaging of the abdomen and pelvis was performed using the standard protocol following bolus administration of intravenous contrast. CONTRAST:  165mL OMNIPAQUE IOHEXOL 300 MG/ML  SOLN COMPARISON:  11/09/2019 FINDINGS: Lower chest: Tiny bilateral pleural effusions are present with associated mild bibasilar compressive atelectasis. Extensive calcification of the aortic valve leaflets. Global cardiac size within normal limits. Hepatobiliary: Cholelithiasis without pericholecystic inflammatory change. Mild hepatic steatosis. No intra or extrahepatic biliary ductal dilation. Pancreas: Unremarkable Spleen: Unremarkable Adrenals/Urinary Tract: Moderate bilateral renal cortical atrophy. Multiple simple cortical cysts are seen within the kidneys bilaterally. No hydronephrosis. No intrarenal or ureteral calculi. The adrenal glands are unremarkable. The bladder is decompressed with a Foley catheter balloon seen within its lumen. Stomach/Bowel: Stomach, small bowel, and large bowel are unremarkable save for a few scattered descending and sigmoid colonic diverticula. The appendix is normal. No free intraperitoneal gas or fluid. Vascular/Lymphatic: Mild aortoiliac atherosclerotic calcification. No aortic aneurysm. The abdominal vasculature is otherwise unremarkable. No pathologic adenopathy within the abdomen and pelvis. Reproductive: Prostate is unremarkable. Other: Extensive subcutaneous gas noted within the left perineum and gluteal folds has improved in the interval since the prior examination. Mild residual inflammatory subcutaneous infiltration is seen within this region, best appreciated  on axial image # 102/2. No discrete drainable fluid collection. Rectum unremarkable. Musculoskeletal: No lytic or blastic bone lesion is seen. IMPRESSION: No definite source identified on this non arteriographic study for the patient's reported gastrointestinal hemorrhage. Improved subcutaneous gas within the perineum and left gluteal crease. Interval Foley decompression of the bladder. Additional incidental findings as noted above. Aortic Atherosclerosis (ICD10-I70.0). Electronically Signed   By: Fidela Salisbury MD   On: 11/22/2019 01:16   CT Angio Abd/Pel w/ and/or w/o  Result Date: 11/22/2019 CLINICAL DATA:  GI bleed, rectal bleeding, hematuria.  On Eliquis EXAM: CTA ABDOMEN AND PELVIS WITHOUT AND WITH CONTRAST TECHNIQUE: Multidetector CT imaging of the abdomen and pelvis was performed using the standard protocol during bolus administration of intravenous contrast. Multiplanar reconstructed images and MIPs were obtained and reviewed to evaluate the vascular anatomy. CONTRAST:  122mL OMNIPAQUE IOHEXOL 350 MG/ML SOLN COMPARISON:  Diagnostic CT of the same day, and earlier studies FINDINGS: VASCULAR Aorta: Mild scattered calcified atheromatous plaque. No aneurysm, dissection, or stenosis. Celiac: Mild short-segment narrowing at the level of the median arcuate ligament of the diaphragm, widely patent distally. SMA: Patent without evidence of aneurysm, dissection, vasculitis or significant stenosis. Renals: Both renal arteries are patent without evidence of aneurysm, dissection, vasculitis, fibromuscular dysplasia or significant stenosis. IMA: Patent without evidence of aneurysm, dissection, vasculitis or significant stenosis. Inflow: Minimal scattered calcified plaque. No aneurysm, dissection, or stenosis. Proximal Outflow: Bilateral common femoral and visualized portions of the superficial and profunda femoral arteries are patent without evidence of aneurysm, dissection, vasculitis or significant stenosis.  Veins: Patent hepatic veins, portal vein, SMV, splenic vein, bilateral renal veins. Iliac venous system and IVC unremarkable. No venous pathology evident. Review of the MIP images confirms the above findings. NON-VASCULAR Lower chest: Small bilateral pleural effusions as before. Coarse calcifications on aortic valve leaflets. Hepatobiliary: Innumerable subcentimeter partially calcified stones layer in the dependent aspect of the nondilated gallbladder. No focal liver lesion. Pancreas:  Unremarkable. No pancreatic ductal dilatation or surrounding inflammatory changes. Spleen: Normal in size without focal abnormality. Adrenals/Urinary Tract: Adrenal glands unremarkable. Renal cysts as before. No hydronephrosis. Urinary bladder decompressed by Foley catheter. Stomach/Bowel: Stomach is nondistended. Small bowel decompressed. Normal appendix. The colon is nondilated with a few scattered descending and sigmoid diverticula. No evidence of active extravasation into the bowel lumen. Lymphatic: No abdominal or pelvic adenopathy. Reproductive: Prostate is unremarkable. Other: No ascites.  Right pelvic phlebolith.  No free air. Musculoskeletal: Small periumbilical hernia containing only mesenteric fat. Anterior vertebral endplate spurring at multiple levels in the lower thoracic spine. Multilevel lumbar spondylitic change. IMPRESSION: 1. No evidence of active extravasation into the bowel lumen. 2. Descending and sigmoid diverticulosis. 3. Small bilateral pleural effusions as before. 4. Cholelithiasis. 5. Small periumbilical hernia containing only mesenteric fat. Aortic Atherosclerosis (ICD10-I70.0). Electronically Signed   By: Lucrezia Europe M.D.   On: 11/22/2019 15:32    Assessment/Plan 1. Hematochezia -etiology not entirely clear--some felt due to surgery and others felt likely diverticular -ceased -f/u cbc, bmp at one week  2. Diverticulosis -known, encourage adequate fiber, hydration and bowel regimen to prevent  infection and bleeding  3. Suprasphincteric perirectal abscess s/p I&D 11/10/2019 -appeared to be recovering well from that until the bleeding happened  4. Paroxysmal atrial fibrillation ([place]) -had eliquis held but back on now, f/u cbc, bmp at one week -not on rate control, f/u cardiology  5. Hypercoagulable state due to atrial fibrillation ([place]) -cont eliquis therapy  6. Poorly controlled diabetes mellitus ([place]) -has lost a tremendous amount of weight so this should be improving, but also has been acutely ill with an abscess and then GI bleed  -cont jardiance  7. Chronic diastolic congestive heart failure ([facility]) -cont lasix 20mg  po daily, monitor renal function and electrolytes, wts, follow heart healthy diet  8. Physical deconditioning -here for PT, OT  9. OSA on CPAP -cont cpap -add melatonin just 3mg  po qhs prn insomnia  10. Acute urinary retention s/p Foley 11/10/2019 -still needs to f/u with urology for his retention  Family/ staff Communication: d/w snf nurse  Labs/tests ordered:  Cbc, bmp at one week  Caidence Kaseman L. Cleto Claggett, D.O. [facility] [address], [place] [address] Cell Phone (Mon-Fri 8am-5pm):  [phone] On Call:  [phone] & follow prompts after 5pm & weekends Office Phone:  [phone] Office Fax:  [phone]

## 2019-11-29 DIAGNOSIS — I5032 Chronic diastolic (congestive) heart failure: Secondary | ICD-10-CM | POA: Insufficient documentation

## 2019-11-30 NOTE — Telephone Encounter (Signed)
Patient refused cardiac event monitor.  Gave instructions to [facility] [place] how to return monitor in  [employer] box with prepaid UPS shipping label.  Order to be cancelled.

## 2019-11-30 NOTE — Telephone Encounter (Signed)
Will send this message to our monitor tech to further follow-up with Monet at [facility], and assist her with returning the pts heart monitor.

## 2019-11-30 NOTE — Telephone Encounter (Signed)
Gary Butler with Gary Butler states patient still refuses to wear monitor, as he does not feel it is necessary. She would like to know how to return it. Please return call to [facility] to discuss at [phone].

## 2019-12-01 ENCOUNTER — Ambulatory Visit: Payer: Medicare PPO | Admitting: Podiatry

## 2019-12-02 LAB — BASIC METABOLIC PANEL
BUN: 5 (ref 4–21)
CO2: 26 — AB (ref 13–22)
Chloride: 99 (ref 99–108)
Creatinine: 0.9 (ref 0.6–1.3)
Glucose: 82
Potassium: 3.9 (ref 3.4–5.3)
Sodium: 140 (ref 137–147)

## 2019-12-02 LAB — COMPREHENSIVE METABOLIC PANEL
Calcium: 9.4 (ref 8.7–10.7)
GFR calc Af Amer: >90
GFR calc non Af Amer: 85.22

## 2019-12-02 LAB — CBC: RBC: 4.48 (ref 3.87–5.11)

## 2019-12-02 LAB — CBC AND DIFFERENTIAL
HCT: 42 (ref 41–53)
Hemoglobin: 14.2 (ref 13.5–17.5)
Platelets: 255 (ref 150–399)
WBC: 4.6

## 2019-12-09 ENCOUNTER — Encounter: Payer: Self-pay | Admitting: Family

## 2019-12-09 ENCOUNTER — Non-Acute Institutional Stay (SKILLED_NURSING_FACILITY): Payer: Medicare PPO | Admitting: Family

## 2019-12-09 DIAGNOSIS — K219 Gastro-esophageal reflux disease without esophagitis: Secondary | ICD-10-CM

## 2019-12-09 DIAGNOSIS — L4050 Arthropathic psoriasis, unspecified: Secondary | ICD-10-CM | POA: Diagnosis not present

## 2019-12-09 DIAGNOSIS — N401 Enlarged prostate with lower urinary tract symptoms: Secondary | ICD-10-CM | POA: Diagnosis not present

## 2019-12-09 DIAGNOSIS — R338 Other retention of urine: Secondary | ICD-10-CM

## 2019-12-09 DIAGNOSIS — E782 Mixed hyperlipidemia: Secondary | ICD-10-CM

## 2019-12-09 DIAGNOSIS — L409 Psoriasis, unspecified: Secondary | ICD-10-CM

## 2019-12-09 DIAGNOSIS — R413 Other amnesia: Secondary | ICD-10-CM

## 2019-12-09 DIAGNOSIS — J069 Acute upper respiratory infection, unspecified: Secondary | ICD-10-CM | POA: Diagnosis not present

## 2019-12-09 DIAGNOSIS — G4733 Obstructive sleep apnea (adult) (pediatric): Secondary | ICD-10-CM

## 2019-12-09 DIAGNOSIS — I5032 Chronic diastolic (congestive) heart failure: Secondary | ICD-10-CM

## 2019-12-09 DIAGNOSIS — R2681 Unsteadiness on feet: Secondary | ICD-10-CM | POA: Diagnosis not present

## 2019-12-09 DIAGNOSIS — Z9989 Dependence on other enabling machines and devices: Secondary | ICD-10-CM

## 2019-12-09 DIAGNOSIS — I480 Paroxysmal atrial fibrillation: Secondary | ICD-10-CM

## 2019-12-09 DIAGNOSIS — M1A09X0 Idiopathic chronic gout, multiple sites, without tophus (tophi): Secondary | ICD-10-CM

## 2019-12-09 DIAGNOSIS — F321 Major depressive disorder, single episode, moderate: Secondary | ICD-10-CM

## 2019-12-09 DIAGNOSIS — M170 Bilateral primary osteoarthritis of knee: Secondary | ICD-10-CM

## 2019-12-09 DIAGNOSIS — K611 Rectal abscess: Secondary | ICD-10-CM | POA: Diagnosis not present

## 2019-12-09 DIAGNOSIS — J310 Chronic rhinitis: Secondary | ICD-10-CM

## 2019-12-09 DIAGNOSIS — E1165 Type 2 diabetes mellitus with hyperglycemia: Secondary | ICD-10-CM

## 2019-12-09 NOTE — Progress Notes (Signed)
Location:  [facility] Room Number: 509-P Place of Service:  SNF [phone])  Provider: Marlowe Sax FNP-C   PCP: [name], MD Patient Care Team: [name], MD as PCP - General (Family Medicine) Troy Sine, MD as PCP - Cardiology (Cardiology) [place], Jennefer Bravo, MD as Referring Physician (Dermatology) Sharyne Peach, MD as Consulting Physician (Ophthalmology) Hennie Duos, MD as Consulting Physician (Rheumatology) Kathie Rhodes, MD (Inactive) as Consulting Physician (Urology) Tanda Rockers, MD as Consulting Physician (Pulmonary Disease) Troy Sine, MD as Consulting Physician (Cardiology) Paralee Cancel, MD as Consulting Physician (Orthopedic Surgery) Cameron Sprang, MD as Consulting Physician (Neurology) Marzetta Board, DPM as Consulting Physician (Podiatry) Renelda Mom, MD as Referring Physician (Colon and Rectal Surgery) Richmond Campbell, MD as Consulting Physician (Gastroenterology) Rolene Course, PA-C as Physician Assistant (Internal Medicine)  Extended Emergency Contact Information Primary Emergency Contact: Centner,Susan Address: [address]          [place], [place] [address] Johnnette Litter of [place] Phone: [phone] Mobile Phone: [phone] Relation: Spouse  Code Status: DNR Goals of care:  Advanced Directive information Advanced Directives 12/09/2019  Does Patient Have a Medical Advance Directive? No  Type of Advance Directive -  Does patient want to make changes to medical advance directive? No - Patient declined  Copy of [place] in Chart? -  Would patient like information on creating a medical advance directive? -     Allergies  Allergen Reactions  . Penicillins Hives    Patient reports full body hives that required medical treatment when he was in his 73s or 38s. He tolerated cephalosporins.   [name] [Pregabalin] Swelling  . Levofloxacin Swelling     Chief Complaint  Patient presents with  . Discharge Note    Discharge from SNF to home with wife on 12/11/2019    HPI:  74 y.o. male seen today at [facility] for discharge home with wife.He was here for short term rehabilitation for post hospital admission from 11/21/2019 - 11/25/2019 for acute GI bleed.His EliQuis was held but later restarted on discharge.There was no source of bleeding identified but suspected due to surgery and possible diverticular.He was given IVF and Hgb monitored every 6 hrs which was stable.He was discharge here to continue with Rehab.He was here prior to admission from previous Hospitalization from 11/09/2019 - 11/17/2019 for perirectal abscess which required surgical incision and drainage. He has a medical history of Hypertension,Afib EliQuis started on first hospitalization,Type 2 DM,Hyperlipidemia,OSA,CHF,GERD,Psoriasis,Osteoarthritis, major Depression,BPH with Urinary retention has indwelling foley Catheter follows up with Urologist Dr.Pace.Also follows up with Cardiology Dr.Kelly for Afib.He has had unremarkable stay since discharged back to Rehab.    He has worked well with PT/OT now stable for discharge home.He will be discharged home with Home health PT/OT to continue with ROM, Exercise, Gait stability and muscle strengthening.He will also require [place] for foley care.He states wife has a ramp in place atr home.Also has a lift chair for going up the stair case.He does not require any DME has own Rolling walker. Home health services will be arranged by facility social worker prior to discharge. Prescription medication will be written x 1 month then patient to follow up with PCP in 1-2 weeks.He denies any acute issues this visit.Facility staff report no new concerns.  Past Medical History:  Diagnosis Date  . Arthritis   . CHF (congestive heart failure) ([place])   . Constipation   .  Diabetes mellitus   . Heart murmur   . History of gout    . History of skin cancer   . Hypertension   . Morbid (severe) obesity due to excess calories ([place])   . OSA (obstructive sleep apnea)   . Psoriatic arthritis ([place])   . Rectal fissure   . Sleep apnea   . Tinnitus     Past Surgical History:  Procedure Laterality Date  . addenoids    . ANAL FISTULOTOMY  2019   Dr Drue Flirt, [facility]  . IRRIGATION AND DEBRIDEMENT ABSCESS N/A 11/10/2019   Procedure: INCISION AND DRAINAGE OF HIGH SCROTAL ABSCESS;  Surgeon: Michael Boston, MD;  Location: [facility];  Service: General;  Laterality: N/A;  . JOINT REPLACEMENT  2012   RT KNEE  . LUNG BIOPSY     20 YRS AGO  . RECTAL EXAM UNDER ANESTHESIA N/A 11/10/2019   Procedure: ANORECTAL EXAM UNDER ANESTHESIA;  Surgeon: Michael Boston, MD;  Location: [facility];  Service: General;  Laterality: N/A;  . TONSILLECTOMY    . TOTAL KNEE ARTHROPLASTY Bilateral 06/22/2013   Procedure: LEFT TOTAL KNEE ARTHROPLASTY;  MEDIAL SOFT TISSUE EXPLORATION SAPHENOUS NEURECTOMY RIGHT KNEE;  Surgeon: Mauri Pole, MD;  Location: [facility];  Service: Orthopedics;  Laterality: Bilateral;      reports that he has never smoked. He has never used smokeless tobacco. He reports current alcohol use. He reports that he does not use drugs. Social History   Socioeconomic History  . Marital status: Married    Spouse name: Not on file  . Number of children: 2  . Years of education: Not on file  . Highest education level: Not on file  Occupational History  . 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 Smoking status: Never Smoker  . Smokeless tobacco: Never Used  Vaping Use  . Vaping Use: Never used  Substance and Sexual Activity  . Alcohol use: Yes    Alcohol/week: 0.0 standard drinks    Comment: Once a month  . Drug use: No  . Sexual activity: Never  Other Topics Concern  . Not on file  Social History Narrative   Alcohol- 1 to 2 per month.    Social Determinants of Health    Financial Resource Strain:   . Difficulty of Paying Living Expenses: Not on file  Food Insecurity:   . Worried About Charity fundraiser in the Last Year: Not on file  . Ran Out of Food in the Last Year: Not on file  Transportation Needs:   . Lack of Transportation (Medical): Not on file  . Lack of Transportation (Non-Medical): Not on file  Physical Activity:   . Days of Exercise per Week: Not on file  . Minutes of Exercise per Session: Not on file  Stress:   . Feeling of Stress : Not on file  Social Connections:   . Frequency of Communication with Friends and Family: Not on file  . Frequency of Social Gatherings with Friends and Family: Not on file  . Attends Religious Services: Not on file  . Active Member of Clubs or Organizations: Not on file  . Attends Archivist Meetings: Not on file  . Marital Status: Not on file  Intimate Partner Violence:   . Fear of Current or Ex-Partner: Not on file  . Emotionally Abused: Not on file  . Physically Abused: Not on file  . Sexually  Abused: Not on file   Functional Status Survey:    Allergies  Allergen Reactions  . Penicillins Hives    Patient reports full body hives that required medical treatment when he was in his 32s or 28s. He tolerated cephalosporins.   [name] [Pregabalin] Swelling  . Levofloxacin Swelling    Pertinent  Health Maintenance Due  Topic Date Due  . FOOT EXAM  02/02/2020  . HEMOGLOBIN A1C  04/24/2020  . OPHTHALMOLOGY EXAM  08/24/2020  . COLONOSCOPY  08/07/2027  . INFLUENZA VACCINE  Completed  . PNA vac Low Risk Adult  Completed    Medications: Outpatient Encounter Medications as of 12/09/2019  Medication Sig  . Albuterol Sulfate (PROAIR RESPICLICK) 749 (90 Base) MCG/ACT AEPB Inhale 2 puffs into the lungs every 6 (six) hours as needed (for wheezing or shortness of breath).  Marland Kitchen allopurinol (ZYLOPRIM) 300 MG tablet TAKE 1 TABLET(300 MG) BY MOUTH DAILY  . apixaban (ELIQUIS) 5 MG TABS tablet Take  1 tablet (5 mg total) by mouth 2 (two) times daily.  Marland Kitchen aspirin 81 MG tablet Take 81 mg by mouth daily.  Marland Kitchen atorvastatin (LIPITOR) 40 MG tablet TAKE 1 TABLET(40 MG) BY MOUTH EVERY EVENING  . bisacodyl (DULCOLAX) 10 MG suppository Place 10 mg rectally as needed for moderate constipation (if not relieved by MOM).   . bisoprolol (ZEBETA) 5 MG tablet Take 0.5 tablets (2.5 mg total) by mouth daily.  . cetirizine (ZYRTEC) 10 MG tablet TAKE 1 TABLET(10 MG) BY MOUTH DAILY  . Cholecalciferol (VITAMIN D) 2000 UNITS tablet Take 2,000 Units by mouth daily.  . Cyanocobalamin (B-12) 2000 MCG TABS Take 2,000 mcg by mouth daily.   Marland Kitchen donepezil (ARICEPT) 10 MG tablet TAKE 1 TABLET(10 MG) BY MOUTH AT BEDTIME  . empagliflozin (JARDIANCE) 10 MG TABS tablet Take 1 tablet (10 mg total) by mouth daily before breakfast.  . feeding supplement, GLUCERNA SHAKE, (GLUCERNA SHAKE) LIQD Take 237 mLs by mouth daily.  . fluocinonide cream (LIDEX) 4.49 % Apply 1 application topically 2 (two) times daily. To arms for psoriasis  . fluticasone (FLONASE) 50 MCG/ACT nasal spray Place 2 sprays into both nostrils daily.  . folic acid (FOLVITE) 1 MG tablet Take 1 mg by mouth daily.   . furosemide (LASIX) 20 MG tablet TAKE 1 TABLET(20 MG) BY MOUTH DAILY  . magnesium hydroxide (MILK OF MAGNESIA) 400 MG/5ML suspension Take 30 mLs by mouth daily as needed for mild constipation. If no bowel movement in 3 days  . Melatonin 3 MG CAPS Take 1 capsule (3 mg total) by mouth at bedtime as needed (insomnia).  . meloxicam (MOBIC) 15 MG tablet Take 1 tablet by mouth daily.  Marland Kitchen nystatin ointment (MYCOSTATIN) Apply 1 application topically daily. Apply to groin and perineal area  . omeprazole (PRILOSEC) 40 MG capsule TAKE 1 CAPSULE BY MOUTH 30 TO 60 MINUTES BEFORE YOUR FIRST AND LAST MEAL OF THE DAY  . OVER THE COUNTER MEDICATION Take 1 Bottle by mouth in the morning and at bedtime. Magic Cup  . polyethylene glycol (MIRALAX / GLYCOLAX) 17 g packet Take 17 g  by mouth daily as needed for mild constipation.  . potassium chloride (MICRO-K) 10 MEQ CR capsule TAKE 1 CAPSULE BY MOUTH EVERY DAY  . psyllium (HYDROCIL/METAMUCIL) 95 % PACK Take 1 packet by mouth 2 (two) times daily.  . sertraline (ZOLOFT) 50 MG tablet TAKE 1 TABLET(50 MG) BY MOUTH DAILY.  Marland Kitchen Sodium Phosphates (RA SALINE ENEMA RE) Place 1 Bottle rectally daily  as needed (constipation if not relieved by biscodyl supp.).  Marland Kitchen tamsulosin (FLOMAX) 0.4 MG CAPS capsule Take 1 capsule (0.4 mg total) by mouth daily after supper.  Marland Kitchen tiZANidine (ZANAFLEX) 4 MG tablet Take 1 tablet (4 mg total) by mouth every 6 (six) hours as needed for muscle spasms.  . [DISCONTINUED] Albuterol Sulfate (PROAIR RESPICLICK) 193 (90 Base) MCG/ACT AEPB Inhale 2 puffs into the lungs every 6 (six) hours as needed (for wheezing or shortness of breath).  . [DISCONTINUED] allopurinol (ZYLOPRIM) 300 MG tablet TAKE 1 TABLET(300 MG) BY MOUTH DAILY  . [DISCONTINUED] apixaban (ELIQUIS) 5 MG TABS tablet Take 1 tablet (5 mg total) by mouth 2 (two) times daily.  . [DISCONTINUED] atorvastatin (LIPITOR) 40 MG tablet TAKE 1 TABLET(40 MG) BY MOUTH EVERY EVENING  . [DISCONTINUED] bisoprolol (ZEBETA) 5 MG tablet Take 0.5 tablets (2.5 mg total) by mouth daily.  . [DISCONTINUED] donepezil (ARICEPT) 10 MG tablet TAKE 1 TABLET(10 MG) BY MOUTH AT BEDTIME  . [DISCONTINUED] empagliflozin (JARDIANCE) 10 MG TABS tablet Take 10 mg by mouth daily before breakfast.  . [DISCONTINUED] fluocinonide cream (LIDEX) 7.90 % Apply 1 application topically 2 (two) times daily. To arms for psoriasis  . [DISCONTINUED] fluticasone (FLONASE) 50 MCG/ACT nasal spray Place 2 sprays into both nostrils daily.  . [DISCONTINUED] furosemide (LASIX) 20 MG tablet TAKE 1 TABLET(20 MG) BY MOUTH DAILY  . [DISCONTINUED] meloxicam (MOBIC) 15 MG tablet Take 1 tablet by mouth daily.  . [DISCONTINUED] nystatin ointment (MYCOSTATIN) Apply 1 application topically daily. Apply to groin and  perineal area  . [DISCONTINUED] omeprazole (PRILOSEC) 40 MG capsule TAKE 1 CAPSULE BY MOUTH 30 TO 60 MINUTES BEFORE YOUR FIRST AND LAST MEAL OF THE DAY  . [DISCONTINUED] potassium chloride (MICRO-K) 10 MEQ CR capsule TAKE 1 CAPSULE BY MOUTH EVERY DAY  . [DISCONTINUED] sertraline (ZOLOFT) 50 MG tablet TAKE 1 TABLET(50 MG) BY MOUTH DAILY.  . [DISCONTINUED] tamsulosin (FLOMAX) 0.4 MG CAPS capsule Take 0.4 mg by mouth daily after supper.  . [DISCONTINUED] tiZANidine (ZANAFLEX) 4 MG tablet Take 1 tablet (4 mg total) by mouth every 6 (six) hours as needed for muscle spasms.   No facility-administered encounter medications on file as of 12/09/2019.     Review of Systems  Constitutional: Negative for appetite change, chills, fatigue and fever.  HENT: Negative for congestion, postnasal drip, rhinorrhea, sinus pressure, sinus pain, sneezing, sore throat and trouble swallowing.   Eyes: Negative for pain, discharge, redness and itching.  Respiratory: Negative for cough, chest tightness, shortness of breath and wheezing.   Cardiovascular: Negative for chest pain, palpitations and leg swelling.  Gastrointestinal: Negative for abdominal distention, abdominal pain, constipation, diarrhea, nausea and vomiting.  Endocrine: Negative for cold intolerance, heat intolerance, polydipsia, polyphagia and polyuria.  Genitourinary: Negative for flank pain and urgency.       Foley catheter   Musculoskeletal: Positive for gait problem. Negative for joint swelling and myalgias.  Skin: Negative for color change, pallor and rash.  Neurological: Negative for dizziness, speech difficulty, light-headedness and headaches.  Hematological: Does not bruise/bleed easily.  Psychiatric/Behavioral: Negative for agitation, behavioral problems and sleep disturbance. The patient is not nervous/anxious.     Vitals:   12/09/19 1204  BP: 133/69  Pulse: 82  Resp: 18  Temp: (!) 97.3 F (36.3 C)  Weight: 290 lb (131.5 kg)  Height:  5\' 11"  (1.803 m)   Body mass index is 40.45 kg/m. Physical Exam Vitals reviewed.  Constitutional:      General: He is  not in acute distress.    Appearance: He is morbidly obese. He is not ill-appearing.  HENT:     Head: Normocephalic.     Nose: Nose normal. No congestion or rhinorrhea.     Mouth/Throat:     Mouth: Mucous membranes are moist.     Pharynx: Oropharynx is clear. No oropharyngeal exudate or posterior oropharyngeal erythema.  Eyes:     General: No scleral icterus.       Right eye: No discharge.        Left eye: No discharge.     Conjunctiva/sclera: Conjunctivae normal.     Pupils: Pupils are equal, round, and reactive to light.  Cardiovascular:     Rate and Rhythm: Normal rate. Rhythm irregular.     Pulses: Normal pulses.     Heart sounds: Normal heart sounds. No murmur heard.  No friction rub. No gallop.   Pulmonary:     Effort: Pulmonary effort is normal. No respiratory distress.     Breath sounds: Normal breath sounds. No wheezing, rhonchi or rales.  Chest:     Chest wall: No tenderness.  Abdominal:     General: Bowel sounds are normal. There is no distension.     Palpations: Abdomen is soft. There is no mass.     Tenderness: There is no right CVA tenderness, left CVA tenderness, guarding or rebound.  Genitourinary:    Comments: Foley Catheter draining clear yellow urine Musculoskeletal:        General: No swelling or tenderness.     Cervical back: Normal range of motion. No rigidity or tenderness.     Right lower leg: Edema present.     Left lower leg: Edema present.     Comments: Unsteady gait   Lymphadenopathy:     Cervical: No cervical adenopathy.  Skin:    General: Skin is warm and dry.     Coloration: Skin is not pale.     Findings: No bruising, erythema or rash.     Comments: Heal perianal wound   Neurological:     Mental Status: He is alert and oriented to person, place, and time.     Cranial Nerves: No cranial nerve deficit.     Motor: No  weakness.     Coordination: Coordination normal.     Gait: Gait abnormal.     Labs reviewed: Basic Metabolic Panel: Recent Labs    11/10/19 0958 11/11/19 0647 11/12/19 0605 11/12/19 0605 11/13/19 0549 11/21/19 2255 11/23/19 0056 11/23/19 0056 11/24/19 0058 11/25/19 0919 12/02/19 0000  NA 130*   < > 132*   < > 132*   < > 138   < > 138 136 140  K 3.3*   < > 3.8   < > 4.0   < > 4.4   < > 3.7 3.8 3.9  CL 96*   < > 101   < > 103   < > 104   < > 104 102 99  CO2 23   < > 21*   < > 16*   < > 22   < > 21* 19* 26*  GLUCOSE 231*   < > 133*   < > 127*   < > 115*  --  112* 98  --   BUN 18   < > 18   < > 15   < > 6*   < > 9 9 5   CREATININE 1.22   < > 1.09   < > 0.98   < >  0.85   < > 0.92 0.80 0.9  CALCIUM 8.7*   < > 8.8*   < > 8.3*   < > 8.3*   < > 8.6* 8.5* 9.4  MG 1.8  --  1.9  --  1.8  --   --   --   --   --   --    < > = values in this interval not displayed.   Liver Function Tests: Recent Labs    11/11/19 0647 11/21/19 2255 11/22/19 1413  AST 16 22 20   ALT 12 13 12   ALKPHOS 70 66 53  BILITOT 1.2 1.2 0.9  PROT 5.4* 5.9* 5.3*  ALBUMIN 2.6* 2.9* 2.6*   Recent Labs    10/29/19 1659 11/09/19 1833  LIPASE 21 22   CBC: Recent Labs    11/23/19 0056 11/23/19 0703 11/24/19 0058 11/24/19 0058 11/24/19 0743 11/25/19 0919 12/02/19 0000  WBC 6.5  --  6.6  --   --  5.9 4.6  NEUTROABS 4.7  --  4.7  --   --  4.0  --   HGB 12.7*   < > 12.2*   < > 11.8* 12.8* 14.2  HCT 38.7*  --  37.1*  --   --  39.2 42  MCV 97.0  --  97.1  --   --  98.0  --   PLT 242  --  244  --   --  223 255   < > = values in this interval not displayed.   CBG: Recent Labs    11/25/19 0731 11/25/19 1146 11/25/19 1757  GLUCAP 90 114* 113*    Procedures and Imaging Studies During Stay: CT ABDOMEN PELVIS W CONTRAST  Result Date: 11/22/2019 CLINICAL DATA:  Chronic anticoagulation, rectal bleeding EXAM: CT ABDOMEN AND PELVIS WITH CONTRAST TECHNIQUE: Multidetector CT imaging of the abdomen and  pelvis was performed using the standard protocol following bolus administration of intravenous contrast. CONTRAST:  111mL OMNIPAQUE IOHEXOL 300 MG/ML  SOLN COMPARISON:  11/09/2019 FINDINGS: Lower chest: Tiny bilateral pleural effusions are present with associated mild bibasilar compressive atelectasis. Extensive calcification of the aortic valve leaflets. Global cardiac size within normal limits. Hepatobiliary: Cholelithiasis without pericholecystic inflammatory change. Mild hepatic steatosis. No intra or extrahepatic biliary ductal dilation. Pancreas: Unremarkable Spleen: Unremarkable Adrenals/Urinary Tract: Moderate bilateral renal cortical atrophy. Multiple simple cortical cysts are seen within the kidneys bilaterally. No hydronephrosis. No intrarenal or ureteral calculi. The adrenal glands are unremarkable. The bladder is decompressed with a Foley catheter balloon seen within its lumen. Stomach/Bowel: Stomach, small bowel, and large bowel are unremarkable save for a few scattered descending and sigmoid colonic diverticula. The appendix is normal. No free intraperitoneal gas or fluid. Vascular/Lymphatic: Mild aortoiliac atherosclerotic calcification. No aortic aneurysm. The abdominal vasculature is otherwise unremarkable. No pathologic adenopathy within the abdomen and pelvis. Reproductive: Prostate is unremarkable. Other: Extensive subcutaneous gas noted within the left perineum and gluteal folds has improved in the interval since the prior examination. Mild residual inflammatory subcutaneous infiltration is seen within this region, best appreciated on axial image # 102/2. No discrete drainable fluid collection. Rectum unremarkable. Musculoskeletal: No lytic or blastic bone lesion is seen. IMPRESSION: No definite source identified on this non arteriographic study for the patient's reported gastrointestinal hemorrhage. Improved subcutaneous gas within the perineum and left gluteal crease. Interval Foley  decompression of the bladder. Additional incidental findings as noted above. Aortic Atherosclerosis (ICD10-I70.0). Electronically Signed   By: Fidela Salisbury MD   On: 11/22/2019  01:16   ECHOCARDIOGRAM COMPLETE  Result Date: 11/11/2019    ECHOCARDIOGRAM REPORT   Patient Name:   Gary Butler  Date of Exam: 11/11/2019 Medical Rec #:  [id number]  Height:       71.0 in Accession #:    [id number] Weight:       302.9 lb Date of Birth:  02-23-1945  BSA:          2.515 m Patient Age:    44 years   BP:           98/58 mmHg Patient Gender: M          HR:           56 bpm. Exam Location:  Inpatient Procedure: 2D Echo Indications:    427.31 atrial fibrillation  History:        Patient has prior history of Echocardiogram examinations, most                 recent 10/11/2017. Risk Factors:Hypertension and Diabetes.  Sonographer:    Jannett Celestine RDCS (AE) Referring Phys: [id number] Sherryll Burger [facility]  Sonographer Comments: Technically difficult study due to poor echo windows, no parasternal window and patient is morbidly obese. Image acquisition challenging due to patient body habitus, Image acquisition challenging due to respiratory motion and restricted mobility. IMPRESSIONS  1. Left ventricular ejection fraction, by estimation, is 60 to 65%. The left ventricle has normal function. The left ventricle has no regional wall motion abnormalities. Left ventricular diastolic parameters are consistent with Grade II diastolic dysfunction (pseudonormalization).  2. Right ventricular systolic function is normal. The right ventricular size is normal.  3. The mitral valve is normal in structure. No evidence of mitral valve regurgitation. No evidence of mitral stenosis.  4. The aortic valve is normal in structure. Aortic valve regurgitation is not visualized. No aortic stenosis is present.  5. The inferior vena cava is normal in size with greater than 50% respiratory variability, suggesting right atrial pressure of 3 mmHg. FINDINGS  Left Ventricle:  Left ventricular ejection fraction, by estimation, is 60 to 65%. The left ventricle has normal function. The left ventricle has no regional wall motion abnormalities. Definity contrast agent was given IV to delineate the left ventricular  endocardial borders. The left ventricular internal cavity size was normal in size. There is no left ventricular hypertrophy. Left ventricular diastolic parameters are consistent with Grade II diastolic dysfunction (pseudonormalization). Right Ventricle: The right ventricular size is normal. No increase in right ventricular wall thickness. Right ventricular systolic function is normal. Left Atrium: Left atrial size was not well visualized. Right Atrium: Right atrial size was normal in size. Pericardium: There is no evidence of pericardial effusion. Mitral Valve: The mitral valve is normal in structure. No evidence of mitral valve regurgitation. No evidence of mitral valve stenosis. Tricuspid Valve: The tricuspid valve is not well visualized. Tricuspid valve regurgitation is trivial. No evidence of tricuspid stenosis. Aortic Valve: The aortic valve is normal in structure. Aortic valve regurgitation is not visualized. No aortic stenosis is present. Pulmonic Valve: The pulmonic valve was normal in structure. Pulmonic valve regurgitation is not visualized. No evidence of pulmonic stenosis. Aorta: The aortic root is normal in size and structure. Venous: The inferior vena cava is normal in size with greater than 50% respiratory variability, suggesting right atrial pressure of 3 mmHg. IAS/Shunts: No atrial level shunt detected by color flow Doppler.   Diastology LV e' medial:    5.44 cm/s LV E/e' medial:  17.2 LV e' lateral:   6.53 cm/s LV E/e' lateral: 14.4  AORTIC VALVE LVOT Vmax:   58.10 cm/s LVOT Vmean:  42.500 cm/s LVOT VTI:    0.123 m MITRAL VALVE MV Area (PHT): 2.26 cm    SHUNTS MV Decel Time: 335 msec    Systemic VTI: 0.12 m MV E velocity: 93.80 cm/s MV A velocity: 38.60 cm/s MV  E/A ratio:  2.43 Candee Furbish MD Electronically signed by Candee Furbish MD Signature Date/Time: 11/11/2019/3:31:15 PM    Final    VAS [place] LOWER EXTREMITY VENOUS (DVT)  Result Date: 11/12/2019  Lower Venous DVTStudy Indications: Chronic bilateral calf pain.  Limitations: Body habitus, poor ultrasound/tissue interface and patient discomfort. Comparison Study: No prior study Performing Technologist: Maudry Mayhew MHA, RDMS, RVT, RDCS  Examination Guidelines: A complete evaluation includes B-mode imaging, spectral Doppler, color Doppler, and power Doppler as needed of all accessible portions of each vessel. Bilateral testing is considered an integral part of a complete examination. Limited examinations for reoccurring indications may be performed as noted. The reflux portion of the exam is performed with the patient in reverse Trendelenburg.  +---------+---------------+---------+-----------+----------+--------------+ RIGHT    CompressibilityPhasicitySpontaneityPropertiesThrombus Aging +---------+---------------+---------+-----------+----------+--------------+ FV Prox  Full                    Yes        Patent                   +---------+---------------+---------+-----------+----------+--------------+ FV Mid                           Yes                                 +---------+---------------+---------+-----------+----------+--------------+ FV DistalFull                                                        +---------+---------------+---------+-----------+----------+--------------+ PFV      Full                    Yes                                 +---------+---------------+---------+-----------+----------+--------------+ POP      Full           No       Yes                                 +---------+---------------+---------+-----------+----------+--------------+ PTV      Full                    Yes                                  +---------+---------------+---------+-----------+----------+--------------+ PERO     Full                    Yes                                 +---------+---------------+---------+-----------+----------+--------------+  Right Technical Findings: Not visualized segments include SFJ, CFV.  +---------+---------------+---------+-----------+----------+--------------+ LEFT     CompressibilityPhasicitySpontaneityPropertiesThrombus Aging +---------+---------------+---------+-----------+----------+--------------+ CFV      Full           No       Yes                                 +---------+---------------+---------+-----------+----------+--------------+ FV Prox  Full                                                        +---------+---------------+---------+-----------+----------+--------------+ FV Mid   Full                                                        +---------+---------------+---------+-----------+----------+--------------+ FV Distal                        Yes                                 +---------+---------------+---------+-----------+----------+--------------+ POP      Full           No       Yes                                 +---------+---------------+---------+-----------+----------+--------------+ PTV      Full                                                        +---------+---------------+---------+-----------+----------+--------------+ PERO     Full                                                        +---------+---------------+---------+-----------+----------+--------------+   Left Technical Findings: Not visualized segments include SFJ, PFV, limited evaluation of PTV and peroneal veins.   Summary: RIGHT: - There is no evidence of deep vein thrombosis in the lower extremity. However, portions of this examination were limited- see technologist comments above.  - No cystic structure found in the popliteal fossa.  LEFT: - There  is no evidence of deep vein thrombosis in the lower extremity. However, portions of this examination were limited- see technologist comments above.  - No cystic structure found in the popliteal fossa.  Bilateral lower extremity venous flow is pulsatile, suggestive of possibly elevated right heart pressure.  *See table(s) above for measurements and observations. Electronically signed by Monica Martinez MD on 11/12/2019 at 5:07:25 PM.    Final    CT Angio Abd/Pel w/ and/or w/o  Result Date: 11/22/2019 CLINICAL DATA:  GI bleed, rectal bleeding, hematuria.  On Eliquis EXAM: CTA ABDOMEN AND PELVIS  WITHOUT AND WITH CONTRAST TECHNIQUE: Multidetector CT imaging of the abdomen and pelvis was performed using the standard protocol during bolus administration of intravenous contrast. Multiplanar reconstructed images and MIPs were obtained and reviewed to evaluate the vascular anatomy. CONTRAST:  159mL OMNIPAQUE IOHEXOL 350 MG/ML SOLN COMPARISON:  Diagnostic CT of the same day, and earlier studies FINDINGS: VASCULAR Aorta: Mild scattered calcified atheromatous plaque. No aneurysm, dissection, or stenosis. Celiac: Mild short-segment narrowing at the level of the median arcuate ligament of the diaphragm, widely patent distally. SMA: Patent without evidence of aneurysm, dissection, vasculitis or significant stenosis. Renals: Both renal arteries are patent without evidence of aneurysm, dissection, vasculitis, fibromuscular dysplasia or significant stenosis. IMA: Patent without evidence of aneurysm, dissection, vasculitis or significant stenosis. Inflow: Minimal scattered calcified plaque. No aneurysm, dissection, or stenosis. Proximal Outflow: Bilateral common femoral and visualized portions of the superficial and profunda femoral arteries are patent without evidence of aneurysm, dissection, vasculitis or significant stenosis. Veins: Patent hepatic veins, portal vein, SMV, splenic vein, bilateral renal veins. Iliac venous  system and IVC unremarkable. No venous pathology evident. Review of the MIP images confirms the above findings. NON-VASCULAR Lower chest: Small bilateral pleural effusions as before. Coarse calcifications on aortic valve leaflets. Hepatobiliary: Innumerable subcentimeter partially calcified stones layer in the dependent aspect of the nondilated gallbladder. No focal liver lesion. Pancreas: Unremarkable. No pancreatic ductal dilatation or surrounding inflammatory changes. Spleen: Normal in size without focal abnormality. Adrenals/Urinary Tract: Adrenal glands unremarkable. Renal cysts as before. No hydronephrosis. Urinary bladder decompressed by Foley catheter. Stomach/Bowel: Stomach is nondistended. Small bowel decompressed. Normal appendix. The colon is nondilated with a few scattered descending and sigmoid diverticula. No evidence of active extravasation into the bowel lumen. Lymphatic: No abdominal or pelvic adenopathy. Reproductive: Prostate is unremarkable. Other: No ascites.  Right pelvic phlebolith.  No free air. Musculoskeletal: Small periumbilical hernia containing only mesenteric fat. Anterior vertebral endplate spurring at multiple levels in the lower thoracic spine. Multilevel lumbar spondylitic change. IMPRESSION: 1. No evidence of active extravasation into the bowel lumen. 2. Descending and sigmoid diverticulosis. 3. Small bilateral pleural effusions as before. 4. Cholelithiasis. 5. Small periumbilical hernia containing only mesenteric fat. Aortic Atherosclerosis (ICD10-I70.0). Electronically Signed   By: Lucrezia Europe M.D.   On: 11/22/2019 15:32    Assessment/Plan:  1. Upper respiratory tract infection, unspecified type Afebrile. - Albuterol Sulfate (PROAIR RESPICLICK) 093 (90 Base) MCG/ACT AEPB; Inhale 2 puffs into the lungs every 6 (six) hours as needed (for wheezing or shortness of breath).  Dispense: 1 each; Refill: 0  2. Chronic diastolic congestive heart failure (HCC) No signs of fluid  overload. Continue on Furosemide and Potassium supplement.  - furosemide (LASIX) 20 MG tablet; TAKE 1 TABLET(20 MG) BY MOUTH DAILY  Dispense: 30 tablet; Refill: 0 - potassium chloride (MICRO-K) 10 MEQ CR capsule; TAKE 1 CAPSULE BY MOUTH EVERY DAY  Dispense: 30 capsule; Refill: 0 - BMP in 1-2 weeks with PCP   3. Suprasphincteric perirectal abscess s/p I&D 11/10/2019 Status post hospital admission as above with I&D done. Pain well controlled. - HHN to monitor. -   CBC, BMP in 1-2 weeks PCP   4. Paroxysmal atrial fibrillation (HCC) HR irregular. - continue on EliQuis. - continue to follow up with Cardiologist as directed.  - apixaban (ELIQUIS) 5 MG TABS tablet; Take 1 tablet (5 mg total) by mouth 2 (two) times daily.  Dispense: 60 tablet; Refill: 0 - bisoprolol (ZEBETA) 5 MG tablet; Take 0.5 tablets (2.5 mg total) by mouth  daily.  Dispense: 15 tablet; Refill: 0 -  CBC, BMP in 1-2 weeks PCP   5. OSA on CPAP Continue on CPAP  6. Psoriatic arthritis ([place]) Continue on current pain regimen.  - meloxicam (MOBIC) 15 MG tablet; Take 1 tablet by mouth daily.  Dispense: 30 tablet; Refill: 0  7. Benign prostatic hyperplasia with urinary retention - continue Tamsulosin  - continue to follow up with Urologist  - HHN for foley care  - tamsulosin (FLOMAX) 0.4 MG CAPS capsule; Take 1 capsule (0.4 mg total) by mouth daily after supper.  Dispense: 30 capsule; Refill: 0  8. Unsteady gait Has worked well with [facility] PT/ OT. He will discharge home PT/OT to continue with ROM, Exercise, Gait stability and muscle strengthening. Has own Rolling walker.Fall and safety precautions.   9. Mixed hyperlipidemia No  Latest LDL for review will defer to PCP  Continue on Atorvastatin  - atorvastatin (LIPITOR) 40 MG tablet; TAKE 1 TABLET(40 MG) BY MOUTH EVERY EVENING  Dispense: 30 tablet; Refill: 0  10. GERD without esophagitis Asymptomatic since recent GI bleed.  - continue on Omeprazole  - omeprazole  (PRILOSEC) 40 MG capsule; TAKE 1 CAPSULE BY MOUTH 30 TO 60 MINUTES BEFORE YOUR FIRST AND LAST MEAL OF THE DAY  Dispense: 30 capsule; Refill: 0 CBC in 1-2 weeks with PCP   11. Memory loss Chronic  - continue on donepezil - donepezil (ARICEPT) 10 MG tablet; TAKE 1 TABLET(10 MG) BY MOUTH AT BEDTIME  Dispense: 30 tablet; Refill: 0  12. Chronic rhinitis Continue on Fluticasone  - fluticasone (FLONASE) 50 MCG/ACT nasal spray; Place 2 sprays into both nostrils daily.  Dispense: 16 g; Refill: 0  13. Chronic gout of multiple sites, unspecified cause Asymptomatic. - continue om allopurinol  - allopurinol (ZYLOPRIM) 300 MG tablet; TAKE 1 TABLET(300 MG) BY MOUTH DAILY  Dispense: 30 tablet; Refill: 0  14. Bilateral primary osteoarthritis of knee Continue on meloxicam.  - meloxicam (MOBIC) 15 MG tablet; Take 1 tablet by mouth daily.  Dispense: 30 tablet; Refill: 0  15. Psoriasis Continue on fluocinonide cream.  - fluocinonide cream (LIDEX) 0.05 %; Apply 1 application topically 2 (two) times daily. To arms for psoriasis  Dispense: 60 g; Refill: 0  16. controlled diabetes mellitus (HCC) CBG stable in the 110's - 180's.No latest Hgb A1C for review will defer to PCP  Continue on Jardiance  - atorvastatin (LIPITOR) 40 MG tablet; TAKE 1 TABLET(40 MG) BY MOUTH EVERY EVENING  Dispense: 30 tablet; Refill: 0 - bisoprolol (ZEBETA) 5 MG tablet; Take 0.5 tablets (2.5 mg total) by mouth daily.  Dispense: 15 tablet; Refill: 0 - empagliflozin (JARDIANCE) 10 MG TABS tablet; Take 1 tablet (10 mg total) by mouth daily before breakfast.  Dispense: 30 tablet; Refill: 0  17. Current moderate episode of major depressive disorder, unspecified whether recurrent (HCC) Mood stable. Continue on sertraline.  - sertraline (ZOLOFT) 50 MG tablet; TAKE 1 TABLET(50 MG) BY MOUTH DAILY.  Dispense: 30 tablet; Refill: 0  Patient is being discharged with the following home health services:   -PT/OT for ROM, exercise, gait  stability and muscle strengthening  -  HH RN for Foley care   Patient is being discharged with the following durable medical equipment:   None Has own rolling walker.  Patient has been advised to f/u with their PCP in 1-2 weeks to for a transitions of care visit.Social services at their facility was responsible for arranging this appointment.  Pt was provided with adequate prescriptions  of noncontrolled medications to reach the scheduled appointment.For controlled substances, a limited supply was provided as appropriate for the individual patient. If the pt normally receives these medications from a pain clinic or has a contract with another physician, these medications should be received from that clinic or physician only).    Future labs/tests needed:  CBC, BMP in 1-2 weeks PCP

## 2019-12-09 NOTE — Telephone Encounter (Signed)
Left message to call back on patients home number Per facility phone issues, patient to go home later this week but should call wife to schedule

## 2019-12-09 NOTE — Telephone Encounter (Signed)
Please call patient and make follow-up with Dr. Claiborne Billings or APP within 1-2 months. Thanks!  Montavius Subramaniam Kathlen Mody, PA-C

## 2019-12-10 ENCOUNTER — Other Ambulatory Visit: Payer: Self-pay | Admitting: Family

## 2019-12-10 DIAGNOSIS — L4050 Arthropathic psoriasis, unspecified: Secondary | ICD-10-CM

## 2019-12-10 DIAGNOSIS — E782 Mixed hyperlipidemia: Secondary | ICD-10-CM

## 2019-12-10 DIAGNOSIS — R413 Other amnesia: Secondary | ICD-10-CM

## 2019-12-10 DIAGNOSIS — E1165 Type 2 diabetes mellitus with hyperglycemia: Secondary | ICD-10-CM

## 2019-12-10 DIAGNOSIS — M1A09X0 Idiopathic chronic gout, multiple sites, without tophus (tophi): Secondary | ICD-10-CM

## 2019-12-10 DIAGNOSIS — I5032 Chronic diastolic (congestive) heart failure: Secondary | ICD-10-CM

## 2019-12-10 DIAGNOSIS — I480 Paroxysmal atrial fibrillation: Secondary | ICD-10-CM

## 2019-12-10 DIAGNOSIS — M170 Bilateral primary osteoarthritis of knee: Secondary | ICD-10-CM

## 2019-12-10 MED ORDER — DONEPEZIL HCL 10 MG PO TABS: ORAL_TABLET | ORAL | 0 refills | Status: DC

## 2019-12-10 MED ORDER — FLUOCINONIDE 0.05 % EX CREA: 1.0000 "application " | TOPICAL_CREAM | Freq: Two times a day (BID) | CUTANEOUS | 0 refills | Status: AC

## 2019-12-10 MED ORDER — EMPAGLIFLOZIN 10 MG PO TABS: 10.0000 mg | ORAL_TABLET | Freq: Every day | ORAL | 0 refills | Status: DC

## 2019-12-10 MED ORDER — MELOXICAM 15 MG PO TABS: ORAL_TABLET | ORAL | 0 refills | Status: DC

## 2019-12-10 MED ORDER — BISOPROLOL FUMARATE 5 MG PO TABS: 2.5000 mg | ORAL_TABLET | Freq: Every day | ORAL | 0 refills | Status: DC

## 2019-12-10 MED ORDER — FLUTICASONE PROPIONATE 50 MCG/ACT NA SUSP: 2.0000 | Freq: Every day | NASAL | 0 refills | Status: AC

## 2019-12-10 MED ORDER — OMEPRAZOLE 40 MG PO CPDR: DELAYED_RELEASE_CAPSULE | ORAL | 0 refills | Status: DC

## 2019-12-10 MED ORDER — PROAIR RESPICLICK 108 (90 BASE) MCG/ACT IN AEPB: 2.0000 | INHALATION_SPRAY | Freq: Four times a day (QID) | RESPIRATORY_TRACT | 0 refills | Status: AC | PRN

## 2019-12-10 MED ORDER — FUROSEMIDE 20 MG PO TABS: ORAL_TABLET | ORAL | 0 refills | Status: DC

## 2019-12-10 MED ORDER — TIZANIDINE HCL 4 MG PO TABS
4.0000 mg | ORAL_TABLET | Freq: Four times a day (QID) | ORAL | 0 refills | Status: DC | PRN
Start: 2019-12-10 — End: 2019-12-18

## 2019-12-10 MED ORDER — ALLOPURINOL 300 MG PO TABS: ORAL_TABLET | ORAL | 0 refills | Status: DC

## 2019-12-10 MED ORDER — APIXABAN 5 MG PO TABS: 5.0000 mg | ORAL_TABLET | Freq: Two times a day (BID) | ORAL | 0 refills | Status: DC

## 2019-12-10 MED ORDER — SERTRALINE HCL 50 MG PO TABS: ORAL_TABLET | ORAL | 0 refills | Status: DC

## 2019-12-10 MED ORDER — POTASSIUM CHLORIDE ER 10 MEQ PO CPCR: ORAL_CAPSULE | ORAL | 0 refills | Status: DC

## 2019-12-10 MED ORDER — NYSTATIN 100000 UNIT/GM EX OINT: 1.0000 "application " | TOPICAL_OINTMENT | Freq: Every day | CUTANEOUS | 0 refills | Status: AC

## 2019-12-10 MED ORDER — ATORVASTATIN CALCIUM 40 MG PO TABS: ORAL_TABLET | ORAL | 0 refills | Status: DC

## 2019-12-10 MED ORDER — TAMSULOSIN HCL 0.4 MG PO CAPS: 0.4000 mg | ORAL_CAPSULE | Freq: Every day | ORAL | 0 refills | Status: DC

## 2019-12-10 NOTE — Telephone Encounter (Signed)
Appt scheduled and will mail to Pt.  Will also send mychart message.

## 2019-12-11 ENCOUNTER — Ambulatory Visit: Payer: Medicare PPO | Admitting: Endocrinology

## 2019-12-13 ENCOUNTER — Other Ambulatory Visit: Payer: Self-pay | Admitting: Family

## 2019-12-13 DIAGNOSIS — F0390 Unspecified dementia without behavioral disturbance: Secondary | ICD-10-CM | POA: Diagnosis not present

## 2019-12-13 DIAGNOSIS — G4733 Obstructive sleep apnea (adult) (pediatric): Secondary | ICD-10-CM | POA: Diagnosis not present

## 2019-12-13 DIAGNOSIS — Z4800 Encounter for change or removal of nonsurgical wound dressing: Secondary | ICD-10-CM | POA: Diagnosis not present

## 2019-12-13 DIAGNOSIS — E1165 Type 2 diabetes mellitus with hyperglycemia: Secondary | ICD-10-CM | POA: Diagnosis not present

## 2019-12-13 DIAGNOSIS — I5032 Chronic diastolic (congestive) heart failure: Secondary | ICD-10-CM | POA: Diagnosis not present

## 2019-12-13 DIAGNOSIS — L4050 Arthropathic psoriasis, unspecified: Secondary | ICD-10-CM | POA: Diagnosis not present

## 2019-12-13 DIAGNOSIS — I110 Hypertensive heart disease with heart failure: Secondary | ICD-10-CM | POA: Diagnosis not present

## 2019-12-13 DIAGNOSIS — I480 Paroxysmal atrial fibrillation: Secondary | ICD-10-CM | POA: Diagnosis not present

## 2019-12-13 DIAGNOSIS — K5730 Diverticulosis of large intestine without perforation or abscess without bleeding: Secondary | ICD-10-CM | POA: Diagnosis not present

## 2019-12-13 DIAGNOSIS — K611 Rectal abscess: Secondary | ICD-10-CM | POA: Diagnosis not present

## 2019-12-13 DIAGNOSIS — N401 Enlarged prostate with lower urinary tract symptoms: Secondary | ICD-10-CM

## 2019-12-14 DIAGNOSIS — E1165 Type 2 diabetes mellitus with hyperglycemia: Secondary | ICD-10-CM | POA: Diagnosis not present

## 2019-12-14 DIAGNOSIS — Z4800 Encounter for change or removal of nonsurgical wound dressing: Secondary | ICD-10-CM | POA: Diagnosis not present

## 2019-12-14 DIAGNOSIS — I5032 Chronic diastolic (congestive) heart failure: Secondary | ICD-10-CM | POA: Diagnosis not present

## 2019-12-14 DIAGNOSIS — L4050 Arthropathic psoriasis, unspecified: Secondary | ICD-10-CM | POA: Diagnosis not present

## 2019-12-14 DIAGNOSIS — I480 Paroxysmal atrial fibrillation: Secondary | ICD-10-CM | POA: Diagnosis not present

## 2019-12-14 DIAGNOSIS — I110 Hypertensive heart disease with heart failure: Secondary | ICD-10-CM | POA: Diagnosis not present

## 2019-12-14 DIAGNOSIS — F0390 Unspecified dementia without behavioral disturbance: Secondary | ICD-10-CM | POA: Diagnosis not present

## 2019-12-14 DIAGNOSIS — K611 Rectal abscess: Secondary | ICD-10-CM | POA: Diagnosis not present

## 2019-12-14 DIAGNOSIS — K5730 Diverticulosis of large intestine without perforation or abscess without bleeding: Secondary | ICD-10-CM | POA: Diagnosis not present

## 2019-12-15 ENCOUNTER — Telehealth: Payer: Self-pay | Admitting: Family Medicine

## 2019-12-15 ENCOUNTER — Other Ambulatory Visit: Payer: Self-pay | Admitting: Family Medicine

## 2019-12-15 ENCOUNTER — Ambulatory Visit: Payer: Medicare PPO | Admitting: Neurology

## 2019-12-15 DIAGNOSIS — F0390 Unspecified dementia without behavioral disturbance: Secondary | ICD-10-CM | POA: Diagnosis not present

## 2019-12-15 DIAGNOSIS — K611 Rectal abscess: Secondary | ICD-10-CM | POA: Diagnosis not present

## 2019-12-15 DIAGNOSIS — Z4800 Encounter for change or removal of nonsurgical wound dressing: Secondary | ICD-10-CM | POA: Diagnosis not present

## 2019-12-15 DIAGNOSIS — I480 Paroxysmal atrial fibrillation: Secondary | ICD-10-CM | POA: Diagnosis not present

## 2019-12-15 DIAGNOSIS — L4050 Arthropathic psoriasis, unspecified: Secondary | ICD-10-CM | POA: Diagnosis not present

## 2019-12-15 DIAGNOSIS — K5730 Diverticulosis of large intestine without perforation or abscess without bleeding: Secondary | ICD-10-CM | POA: Diagnosis not present

## 2019-12-15 DIAGNOSIS — I5032 Chronic diastolic (congestive) heart failure: Secondary | ICD-10-CM | POA: Diagnosis not present

## 2019-12-15 DIAGNOSIS — E1165 Type 2 diabetes mellitus with hyperglycemia: Secondary | ICD-10-CM

## 2019-12-15 DIAGNOSIS — I110 Hypertensive heart disease with heart failure: Secondary | ICD-10-CM | POA: Diagnosis not present

## 2019-12-15 NOTE — Telephone Encounter (Signed)
Physical therapist assessed patient today - will see for strengthening, walking and balance training - once a  week x 2 weeks, twice x 3 weeks, then once a  week for 2 weeks

## 2019-12-15 NOTE — Telephone Encounter (Signed)
PT called and is asking if Strengthening walking and balance training once a week for 2 weeks, twice a week for 3 weeks?

## 2019-12-15 NOTE — Telephone Encounter (Signed)
Left vm for verbal  order for OT for said patient.

## 2019-12-15 NOTE — Telephone Encounter (Signed)
Spoke with PT and gave him the ok for said patient.

## 2019-12-15 NOTE — Telephone Encounter (Signed)
[place] for verbal order to proceed w/ PT

## 2019-12-15 NOTE — Telephone Encounter (Signed)
Timmothy Sours (OT with [facility] home health) called asking for verbal orders to see pt for OT 1X a wk for 3wks, 0X a wk for 1wk, 1X a wk for 2wks for OT, ADL, IADL, transfers, and exercise.  Ok to [facility] if no answer at [phone]

## 2019-12-16 DIAGNOSIS — E1165 Type 2 diabetes mellitus with hyperglycemia: Secondary | ICD-10-CM | POA: Diagnosis not present

## 2019-12-16 DIAGNOSIS — I5032 Chronic diastolic (congestive) heart failure: Secondary | ICD-10-CM | POA: Diagnosis not present

## 2019-12-16 DIAGNOSIS — I110 Hypertensive heart disease with heart failure: Secondary | ICD-10-CM | POA: Diagnosis not present

## 2019-12-16 DIAGNOSIS — K611 Rectal abscess: Secondary | ICD-10-CM | POA: Diagnosis not present

## 2019-12-16 DIAGNOSIS — I480 Paroxysmal atrial fibrillation: Secondary | ICD-10-CM | POA: Diagnosis not present

## 2019-12-16 DIAGNOSIS — F0390 Unspecified dementia without behavioral disturbance: Secondary | ICD-10-CM | POA: Diagnosis not present

## 2019-12-16 DIAGNOSIS — K5730 Diverticulosis of large intestine without perforation or abscess without bleeding: Secondary | ICD-10-CM | POA: Diagnosis not present

## 2019-12-16 DIAGNOSIS — Z4800 Encounter for change or removal of nonsurgical wound dressing: Secondary | ICD-10-CM | POA: Diagnosis not present

## 2019-12-16 DIAGNOSIS — L4050 Arthropathic psoriasis, unspecified: Secondary | ICD-10-CM | POA: Diagnosis not present

## 2019-12-18 ENCOUNTER — Ambulatory Visit (INDEPENDENT_AMBULATORY_CARE_PROVIDER_SITE_OTHER): Payer: Medicare PPO | Admitting: Family Medicine

## 2019-12-18 ENCOUNTER — Other Ambulatory Visit: Payer: Self-pay

## 2019-12-18 ENCOUNTER — Encounter: Payer: Self-pay | Admitting: Family Medicine

## 2019-12-18 VITALS — BP 126/74 | HR 85 | Temp 96.6°F | Resp 20 | Ht 71.0 in

## 2019-12-18 DIAGNOSIS — R5381 Other malaise: Secondary | ICD-10-CM | POA: Diagnosis not present

## 2019-12-18 DIAGNOSIS — R197 Diarrhea, unspecified: Secondary | ICD-10-CM | POA: Diagnosis not present

## 2019-12-18 DIAGNOSIS — R338 Other retention of urine: Secondary | ICD-10-CM | POA: Diagnosis not present

## 2019-12-18 DIAGNOSIS — K611 Rectal abscess: Secondary | ICD-10-CM | POA: Diagnosis not present

## 2019-12-18 NOTE — Patient Instructions (Signed)
Follow up as needed When you go to GI on Tuesday, please go w/ a list of concerns- his inability to swallow, lack on nutrition, lack of rectal control, etc Restart Immodium 3x/day to help slow the stools Since you can't eat much, make sure you are eating frequently throughout the day Call with any questions or concerns Hang in there!!!

## 2019-12-18 NOTE — Progress Notes (Addendum)
   Subjective:    Patient ID: Gary Butler, male    DOB: 12-23-1945, 74 y.o.   MRN: [id number]  HPI Hospital/Rehab f/u- pt was hospitalized 9/27-10/5 and then 10/9-10/13.  Initially with perirectal abscess requiring I&D.  He had urinary retention and failed his voiding trial.  D/c'd w/ foley.  He had new onset A fib and was started on Eliquis.  Then on 10/9 he had episode of BRPBR w/ clots.  After CT angio of abd/pelvis and GI consultation it was felt that he bleeding was from his surgical site.  He was ok'd to restart Eliquis at d/c.    He went to [facility] for rehab from 10/13-10/27 at which time he had no acute issues.  He was to continue [facility] PT for gait and stability.  [facility] RN for wound care.  HH aid for ADL assistance.  Is to use Rollator or wheelchair.  Now has a semi electric hospital bed and a bedside commode.   Today in office pt had fecal incontinence.  Per wife, 'he is still not eating and still shitting all over the place'.  Has Barium study upcoming w/ GI and f/u OV w/ GI.  No improvement in sxs w/ lactose free diet.  Denies abd cramping.  Incontinence will occur w/o warning.  Also having difficulty w/ swallowing- had esophagus stretched w/ minimal improvement.  Worked w/ speech therapy at rehab.  Urinary retention- catheter was removed at rehab.  Reports he is able to void w/o difficulty.  No f/u w/ urology is planned.  Reviewed past medical, surgical, family and social histories.   Reviewed hospital H&P, D/C summary, consult notes, imaging, labs, Rehab d/c.   Review of Systems For ROS see HPI   This visit occurred during the SARS-CoV-2 public health emergency.  Safety protocols were in place, including screening questions prior to the visit, additional usage of staff PPE, and extensive cleaning of exam room while observing appropriate contact time as indicated for disinfecting solutions.       Objective:   Physical Exam Vitals reviewed.  Constitutional:      Appearance: He is  obese. He is ill-appearing and diaphoretic.  HENT:     Head: Normocephalic and atraumatic.  Skin:    Coloration: Skin is pale.  Neurological:     Mental Status: He is alert and oriented to person, place, and time.     Motor: Weakness (walking with a walker, fatigues easily just w/ sitting up during the visit) present.  Psychiatric:        Mood and Affect: Mood normal.        Behavior: Behavior normal.        Thought Content: Thought content normal.           Assessment & Plan:  Total time spent w/ pt and wife 50 minutes and this included reviewing both his hospital and rehab d/c summaries w/ pt and wife, reconciling meds, discussing next steps in his GI work up, and discussing his other ongoing issues (deconditioning, urinary retention, falls, wound care)

## 2019-12-21 ENCOUNTER — Other Ambulatory Visit: Payer: Self-pay | Admitting: Family Medicine

## 2019-12-21 ENCOUNTER — Other Ambulatory Visit: Payer: Self-pay | Admitting: Internal Medicine

## 2019-12-21 ENCOUNTER — Telehealth: Payer: Self-pay | Admitting: Family Medicine

## 2019-12-21 DIAGNOSIS — Z4800 Encounter for change or removal of nonsurgical wound dressing: Secondary | ICD-10-CM | POA: Diagnosis not present

## 2019-12-21 DIAGNOSIS — L4050 Arthropathic psoriasis, unspecified: Secondary | ICD-10-CM | POA: Diagnosis not present

## 2019-12-21 DIAGNOSIS — E782 Mixed hyperlipidemia: Secondary | ICD-10-CM

## 2019-12-21 DIAGNOSIS — I5032 Chronic diastolic (congestive) heart failure: Secondary | ICD-10-CM

## 2019-12-21 DIAGNOSIS — E1165 Type 2 diabetes mellitus with hyperglycemia: Secondary | ICD-10-CM | POA: Diagnosis not present

## 2019-12-21 DIAGNOSIS — I480 Paroxysmal atrial fibrillation: Secondary | ICD-10-CM | POA: Diagnosis not present

## 2019-12-21 DIAGNOSIS — F0390 Unspecified dementia without behavioral disturbance: Secondary | ICD-10-CM | POA: Diagnosis not present

## 2019-12-21 DIAGNOSIS — K5730 Diverticulosis of large intestine without perforation or abscess without bleeding: Secondary | ICD-10-CM | POA: Diagnosis not present

## 2019-12-21 DIAGNOSIS — K219 Gastro-esophageal reflux disease without esophagitis: Secondary | ICD-10-CM

## 2019-12-21 DIAGNOSIS — I110 Hypertensive heart disease with heart failure: Secondary | ICD-10-CM | POA: Diagnosis not present

## 2019-12-21 DIAGNOSIS — M170 Bilateral primary osteoarthritis of knee: Secondary | ICD-10-CM

## 2019-12-21 DIAGNOSIS — K611 Rectal abscess: Secondary | ICD-10-CM | POA: Diagnosis not present

## 2019-12-21 NOTE — Telephone Encounter (Signed)
Patient spouse called and stated that Gary Butler fell in slid down wall attempting to get out. Reports no injury and does decline medical attention. Family has been instructed not to take a shower until grab bars are in place and place mats are in place as well and recieves ok from the therapist after training. Is this recommendation ok per therapist. Please advise!

## 2019-12-21 NOTE — Telephone Encounter (Signed)
Patient had a fall in shower this morning.  Slid down wall attempting to get out - reports no injury, declines need to see MD, instructed patient and spouse for him not to take shower until grab bars installed, anti slip mat and receives ok from therapist after training.Please call [phone] with any concerns and if you are ok with this recommendation please call and leave a message stating that you are ok with therapist recommendation.  Thanks

## 2019-12-21 NOTE — Telephone Encounter (Signed)
I agree w/ therapist recommendation.  If he develops pain or they are concerned about an injury, he will need to be examined.

## 2019-12-22 DIAGNOSIS — F0390 Unspecified dementia without behavioral disturbance: Secondary | ICD-10-CM | POA: Diagnosis not present

## 2019-12-22 DIAGNOSIS — I480 Paroxysmal atrial fibrillation: Secondary | ICD-10-CM | POA: Diagnosis not present

## 2019-12-22 DIAGNOSIS — I5032 Chronic diastolic (congestive) heart failure: Secondary | ICD-10-CM | POA: Diagnosis not present

## 2019-12-22 DIAGNOSIS — Z4800 Encounter for change or removal of nonsurgical wound dressing: Secondary | ICD-10-CM | POA: Diagnosis not present

## 2019-12-22 DIAGNOSIS — R634 Abnormal weight loss: Secondary | ICD-10-CM | POA: Diagnosis not present

## 2019-12-22 DIAGNOSIS — R197 Diarrhea, unspecified: Secondary | ICD-10-CM | POA: Diagnosis not present

## 2019-12-22 DIAGNOSIS — K5730 Diverticulosis of large intestine without perforation or abscess without bleeding: Secondary | ICD-10-CM | POA: Diagnosis not present

## 2019-12-22 DIAGNOSIS — I110 Hypertensive heart disease with heart failure: Secondary | ICD-10-CM | POA: Diagnosis not present

## 2019-12-22 DIAGNOSIS — R1310 Dysphagia, unspecified: Secondary | ICD-10-CM | POA: Diagnosis not present

## 2019-12-22 DIAGNOSIS — E1165 Type 2 diabetes mellitus with hyperglycemia: Secondary | ICD-10-CM | POA: Diagnosis not present

## 2019-12-22 DIAGNOSIS — R152 Fecal urgency: Secondary | ICD-10-CM | POA: Diagnosis not present

## 2019-12-22 DIAGNOSIS — L4050 Arthropathic psoriasis, unspecified: Secondary | ICD-10-CM | POA: Diagnosis not present

## 2019-12-22 DIAGNOSIS — K611 Rectal abscess: Secondary | ICD-10-CM | POA: Diagnosis not present

## 2019-12-22 NOTE — Telephone Encounter (Signed)
Spoke with patient wife and she voiced understanding. I called Don the therapist and left a message on vm to proceed with therapy.

## 2019-12-23 ENCOUNTER — Encounter: Payer: Self-pay | Admitting: Family Medicine

## 2019-12-23 DIAGNOSIS — L4050 Arthropathic psoriasis, unspecified: Secondary | ICD-10-CM | POA: Diagnosis not present

## 2019-12-23 DIAGNOSIS — E1165 Type 2 diabetes mellitus with hyperglycemia: Secondary | ICD-10-CM | POA: Diagnosis not present

## 2019-12-23 DIAGNOSIS — K5730 Diverticulosis of large intestine without perforation or abscess without bleeding: Secondary | ICD-10-CM | POA: Diagnosis not present

## 2019-12-23 DIAGNOSIS — F0390 Unspecified dementia without behavioral disturbance: Secondary | ICD-10-CM | POA: Diagnosis not present

## 2019-12-23 DIAGNOSIS — I110 Hypertensive heart disease with heart failure: Secondary | ICD-10-CM | POA: Diagnosis not present

## 2019-12-23 DIAGNOSIS — I5032 Chronic diastolic (congestive) heart failure: Secondary | ICD-10-CM | POA: Diagnosis not present

## 2019-12-23 DIAGNOSIS — K611 Rectal abscess: Secondary | ICD-10-CM | POA: Diagnosis not present

## 2019-12-23 DIAGNOSIS — I480 Paroxysmal atrial fibrillation: Secondary | ICD-10-CM | POA: Diagnosis not present

## 2019-12-23 DIAGNOSIS — Z4800 Encounter for change or removal of nonsurgical wound dressing: Secondary | ICD-10-CM | POA: Diagnosis not present

## 2019-12-23 NOTE — Assessment & Plan Note (Signed)
Pt is severely debilitated after recent hospitalization and rehab stay.  He is weak, barely able to remain sitting upright during our visit.  He has [facility] PT for gait and stability.  At this time he has to use Rollator or wheelchair for safety.

## 2019-12-23 NOTE — Assessment & Plan Note (Signed)
Pt has [facility] RN coming to the house for wound care.  Encouraged them to keep area clean and dry as best as possible- which means taking extra care after an episode of fecal incontinence

## 2019-12-23 NOTE — Assessment & Plan Note (Signed)
Pt reports foley was removed at Rehab and he has not had any issues voiding since that time.  Encouraged him to monitor for decreased urination and at that time, he will need urology follow up.  Pt expressed understanding and is in agreement w/ plan.

## 2019-12-23 NOTE — Assessment & Plan Note (Signed)
Deteriorated.  Pt had episode of fecal incontinence in office today.  He is unable to feel when he needs to defecate- no cramping or abd pain.  'it just pours out of me'.  He has barium study upcoming w/ [facility].  We discussed restarting the Immodium TID for some control.  Also discussed the possibility of a neurologic issue since he has no control of his sphincter and no feeling.  Encouraged him to follow up w/ academic setting rather than local GI.  Pt and wife agreeable to plan

## 2019-12-24 ENCOUNTER — Telehealth: Payer: Self-pay | Admitting: Family Medicine

## 2019-12-24 NOTE — Telephone Encounter (Signed)
Home Health orders placed in Dr. Virgil Benedict bin up front -

## 2019-12-24 NOTE — Telephone Encounter (Signed)
Noted  

## 2019-12-25 ENCOUNTER — Telehealth: Payer: Self-pay | Admitting: Family Medicine

## 2019-12-25 DIAGNOSIS — I5032 Chronic diastolic (congestive) heart failure: Secondary | ICD-10-CM | POA: Diagnosis not present

## 2019-12-25 DIAGNOSIS — L4050 Arthropathic psoriasis, unspecified: Secondary | ICD-10-CM | POA: Diagnosis not present

## 2019-12-25 DIAGNOSIS — G4733 Obstructive sleep apnea (adult) (pediatric): Secondary | ICD-10-CM

## 2019-12-25 DIAGNOSIS — Z4800 Encounter for change or removal of nonsurgical wound dressing: Secondary | ICD-10-CM | POA: Diagnosis not present

## 2019-12-25 DIAGNOSIS — Z7901 Long term (current) use of anticoagulants: Secondary | ICD-10-CM

## 2019-12-25 DIAGNOSIS — R339 Retention of urine, unspecified: Secondary | ICD-10-CM

## 2019-12-25 DIAGNOSIS — F0390 Unspecified dementia without behavioral disturbance: Secondary | ICD-10-CM | POA: Diagnosis not present

## 2019-12-25 DIAGNOSIS — Z96653 Presence of artificial knee joint, bilateral: Secondary | ICD-10-CM

## 2019-12-25 DIAGNOSIS — Z79899 Other long term (current) drug therapy: Secondary | ICD-10-CM

## 2019-12-25 DIAGNOSIS — M5416 Radiculopathy, lumbar region: Secondary | ICD-10-CM

## 2019-12-25 DIAGNOSIS — R010 Benign and innocent cardiac murmurs: Secondary | ICD-10-CM

## 2019-12-25 DIAGNOSIS — Z7984 Long term (current) use of oral hypoglycemic drugs: Secondary | ICD-10-CM

## 2019-12-25 DIAGNOSIS — E1165 Type 2 diabetes mellitus with hyperglycemia: Secondary | ICD-10-CM | POA: Diagnosis not present

## 2019-12-25 DIAGNOSIS — I700 Atherosclerosis of aorta: Secondary | ICD-10-CM

## 2019-12-25 DIAGNOSIS — Z7982 Long term (current) use of aspirin: Secondary | ICD-10-CM

## 2019-12-25 DIAGNOSIS — K5900 Constipation, unspecified: Secondary | ICD-10-CM

## 2019-12-25 DIAGNOSIS — K219 Gastro-esophageal reflux disease without esophagitis: Secondary | ICD-10-CM

## 2019-12-25 DIAGNOSIS — Z791 Long term (current) use of non-steroidal anti-inflammatories (NSAID): Secondary | ICD-10-CM

## 2019-12-25 DIAGNOSIS — I69318 Other symptoms and signs involving cognitive functions following cerebral infarction: Secondary | ICD-10-CM

## 2019-12-25 DIAGNOSIS — I110 Hypertensive heart disease with heart failure: Secondary | ICD-10-CM | POA: Diagnosis not present

## 2019-12-25 DIAGNOSIS — I480 Paroxysmal atrial fibrillation: Secondary | ICD-10-CM | POA: Diagnosis not present

## 2019-12-25 DIAGNOSIS — Z9181 History of falling: Secondary | ICD-10-CM

## 2019-12-25 DIAGNOSIS — K611 Rectal abscess: Secondary | ICD-10-CM | POA: Diagnosis not present

## 2019-12-25 DIAGNOSIS — K5730 Diverticulosis of large intestine without perforation or abscess without bleeding: Secondary | ICD-10-CM | POA: Diagnosis not present

## 2019-12-25 DIAGNOSIS — Z6841 Body Mass Index (BMI) 40.0 and over, adult: Secondary | ICD-10-CM

## 2019-12-25 NOTE — Telephone Encounter (Signed)
I have placed [place] orders from [facility] in the bin up front with a charge sheet

## 2019-12-25 NOTE — Telephone Encounter (Signed)
Noted  

## 2019-12-28 ENCOUNTER — Ambulatory Visit: Payer: Medicare PPO | Admitting: Physician Assistant

## 2019-12-28 ENCOUNTER — Encounter: Payer: Self-pay | Admitting: Physician Assistant

## 2019-12-28 ENCOUNTER — Telehealth: Payer: Self-pay | Admitting: Family Medicine

## 2019-12-28 ENCOUNTER — Other Ambulatory Visit: Payer: Self-pay

## 2019-12-28 VITALS — BP 99/60 | HR 93 | Temp 97.9°F | Resp 16 | Ht 71.0 in | Wt 290.0 lb

## 2019-12-28 DIAGNOSIS — L4050 Arthropathic psoriasis, unspecified: Secondary | ICD-10-CM | POA: Diagnosis not present

## 2019-12-28 DIAGNOSIS — K5730 Diverticulosis of large intestine without perforation or abscess without bleeding: Secondary | ICD-10-CM | POA: Diagnosis not present

## 2019-12-28 DIAGNOSIS — I480 Paroxysmal atrial fibrillation: Secondary | ICD-10-CM | POA: Diagnosis not present

## 2019-12-28 DIAGNOSIS — R001 Bradycardia, unspecified: Secondary | ICD-10-CM | POA: Diagnosis not present

## 2019-12-28 DIAGNOSIS — Z9181 History of falling: Secondary | ICD-10-CM | POA: Diagnosis not present

## 2019-12-28 DIAGNOSIS — I5032 Chronic diastolic (congestive) heart failure: Secondary | ICD-10-CM | POA: Diagnosis not present

## 2019-12-28 DIAGNOSIS — R1319 Other dysphagia: Secondary | ICD-10-CM | POA: Diagnosis not present

## 2019-12-28 DIAGNOSIS — N390 Urinary tract infection, site not specified: Secondary | ICD-10-CM | POA: Diagnosis not present

## 2019-12-28 DIAGNOSIS — E1165 Type 2 diabetes mellitus with hyperglycemia: Secondary | ICD-10-CM | POA: Diagnosis not present

## 2019-12-28 DIAGNOSIS — B9620 Unspecified Escherichia coli [E. coli] as the cause of diseases classified elsewhere: Secondary | ICD-10-CM | POA: Diagnosis not present

## 2019-12-28 DIAGNOSIS — R0902 Hypoxemia: Secondary | ICD-10-CM | POA: Diagnosis not present

## 2019-12-28 DIAGNOSIS — I4891 Unspecified atrial fibrillation: Secondary | ICD-10-CM | POA: Diagnosis not present

## 2019-12-28 DIAGNOSIS — R55 Syncope and collapse: Secondary | ICD-10-CM | POA: Diagnosis not present

## 2019-12-28 DIAGNOSIS — M533 Sacrococcygeal disorders, not elsewhere classified: Secondary | ICD-10-CM

## 2019-12-28 DIAGNOSIS — K529 Noninfective gastroenteritis and colitis, unspecified: Secondary | ICD-10-CM | POA: Diagnosis not present

## 2019-12-28 DIAGNOSIS — I959 Hypotension, unspecified: Secondary | ICD-10-CM | POA: Diagnosis not present

## 2019-12-28 DIAGNOSIS — I951 Orthostatic hypotension: Secondary | ICD-10-CM | POA: Diagnosis not present

## 2019-12-28 DIAGNOSIS — Z4800 Encounter for change or removal of nonsurgical wound dressing: Secondary | ICD-10-CM | POA: Diagnosis not present

## 2019-12-28 DIAGNOSIS — S51011A Laceration without foreign body of right elbow, initial encounter: Secondary | ICD-10-CM | POA: Diagnosis not present

## 2019-12-28 DIAGNOSIS — Z7409 Other reduced mobility: Secondary | ICD-10-CM | POA: Diagnosis not present

## 2019-12-28 DIAGNOSIS — Z20822 Contact with and (suspected) exposure to covid-19: Secondary | ICD-10-CM | POA: Diagnosis not present

## 2019-12-28 DIAGNOSIS — I110 Hypertensive heart disease with heart failure: Secondary | ICD-10-CM | POA: Diagnosis not present

## 2019-12-28 DIAGNOSIS — R159 Full incontinence of feces: Secondary | ICD-10-CM | POA: Diagnosis not present

## 2019-12-28 DIAGNOSIS — F0390 Unspecified dementia without behavioral disturbance: Secondary | ICD-10-CM | POA: Diagnosis not present

## 2019-12-28 DIAGNOSIS — K611 Rectal abscess: Secondary | ICD-10-CM | POA: Diagnosis not present

## 2019-12-28 DIAGNOSIS — S51012A Laceration without foreign body of left elbow, initial encounter: Secondary | ICD-10-CM | POA: Diagnosis not present

## 2019-12-28 MED ORDER — TIZANIDINE HCL 4 MG PO TABS
2.0000 mg | ORAL_TABLET | Freq: Three times a day (TID) | ORAL | 0 refills | Status: AC | PRN
Start: 2019-12-28 — End: ?

## 2019-12-28 NOTE — Telephone Encounter (Signed)
Noted  

## 2019-12-28 NOTE — Telephone Encounter (Signed)
Picked up from the back, faxed to the # provided on the form and sent to scan  

## 2019-12-28 NOTE — Telephone Encounter (Signed)
Home health form placed in Dr. Virgil Benedict bin up front

## 2019-12-28 NOTE — Telephone Encounter (Signed)
Picked up from the back, faxed, and sent to scan

## 2019-12-28 NOTE — Progress Notes (Signed)
Patient presents to clinic today c/o pain in sacral/coccygeal region after a fall at home at the beginning of last week.  Patient and wife endorse that patient was finishing his shower and trying to step out of the shower to dry cough.  Slipped on the wet floor and fell down directly onto his buttock.  Denies any head trauma or loss of consciousness.  Was able to get up with the assistance of his wife and rescue squad.  Was evaluated at the time and no ER assessment was warranted.  Patient states he is done well since then except for moderate pain in the lower back at his "tailbone".  Notes the pain is worse with sitting and can get up to an 8 out of 10.  Improves with lying down or with standing.  Does not radiate anywhere else.  Denies any bruising of the skin.  Has taken Tylenol with only some improvement in symptoms.  Past Medical History:  Diagnosis Date  . Arthritis   . CHF (congestive heart failure) ([place])   . Constipation   . Diabetes mellitus   . Heart murmur   . History of gout   . History of skin cancer   . Hypertension   . Morbid (severe) obesity due to excess calories ([place])   . OSA (obstructive sleep apnea)   . Psoriatic arthritis ([place])   . Rectal fissure   . Sleep apnea   . Tinnitus     Current Outpatient Medications on File Prior to Visit  Medication Sig Dispense Refill  . Albuterol Sulfate (PROAIR RESPICLICK) 564 (90 Base) MCG/ACT AEPB Inhale 2 puffs into the lungs every 6 (six) hours as needed (for wheezing or shortness of breath). 1 each 0  . allopurinol (ZYLOPRIM) 300 MG tablet TAKE 1 TABLET(300 MG) BY MOUTH DAILY 30 tablet 0  . apixaban (ELIQUIS) 5 MG TABS tablet Take 1 tablet (5 mg total) by mouth 2 (two) times daily. 60 tablet 0  . atorvastatin (LIPITOR) 40 MG tablet TAKE 1 TABLET(40 MG) BY MOUTH EVERY EVENING 30 tablet 0  . bisoprolol (ZEBETA) 5 MG tablet Take 0.5 tablets (2.5 mg total) by mouth daily. 15 tablet 0  . cetirizine (ZYRTEC) 10 MG tablet TAKE 1  TABLET(10 MG) BY MOUTH DAILY 30 tablet 0  . Cholecalciferol (VITAMIN D) 2000 UNITS tablet Take 2,000 Units by mouth daily.    . Cyanocobalamin (B-12) 2000 MCG TABS Take 2,000 mcg by mouth daily.     Marland Kitchen donepezil (ARICEPT) 10 MG tablet TAKE 1 TABLET(10 MG) BY MOUTH AT BEDTIME 30 tablet 0  . fluocinonide cream (LIDEX) 3.32 % Apply 1 application topically 2 (two) times daily. To arms for psoriasis 60 g 0  . fluticasone (FLONASE) 50 MCG/ACT nasal spray Place 2 sprays into both nostrils daily. 16 g 0  . folic acid (FOLVITE) 1 MG tablet Take 1 mg by mouth daily.     . furosemide (LASIX) 20 MG tablet TAKE 1 TABLET(20 MG) BY MOUTH DAILY 30 tablet 0  . JARDIANCE 10 MG TABS tablet TAKE 1 TABLET BY MOUTH DAILY BEFORE BREAKFAST 30 tablet 0  . meloxicam (MOBIC) 15 MG tablet Take 1 tablet by mouth daily. 30 tablet 0  . nystatin ointment (MYCOSTATIN) Apply 1 application topically daily. Apply to groin and perineal area 30 g 0  . omeprazole (PRILOSEC) 40 MG capsule TAKE 1 CAPSULE BY MOUTH 30 TO 60 MINUTES BEFORE YOUR FIRST AND LAST MEAL OF THE DAY 30 capsule 0  .  potassium chloride (MICRO-K) 10 MEQ CR capsule TAKE 1 CAPSULE BY MOUTH EVERY DAY 30 capsule 0  . psyllium (HYDROCIL/METAMUCIL) 95 % PACK Take 1 packet by mouth 2 (two) times daily. 180 packet 0   No current facility-administered medications on file prior to visit.    Allergies  Allergen Reactions  . Penicillins Hives    Patient reports full body hives that required medical treatment when he was in his 23s or 23s. He tolerated cephalosporins.   [name] [Pregabalin] Swelling  . Levofloxacin Swelling    Family History  Problem Relation Age of Onset  . Emphysema Father   . Clotting disorder Father   . Arthritis Father   . Heart disease Mother   . Arthritis Mother   . Arthritis Maternal Grandmother   . Hypertension Maternal Grandmother   . Diabetes Maternal Grandmother   . Arthritis Paternal Grandmother   . Diabetes Maternal Aunt   .  Diabetes Paternal Aunt     Social History   Socioeconomic History  . Marital status: Married    Spouse name: Not on file  . Number of children: 2  . Years of education: Not on file  . Highest education level: Not on file  Occupational History  . 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 Smoking status: Never Smoker  . Smokeless tobacco: Never Used  Vaping Use  . Vaping Use: Never used  Substance and Sexual Activity  . Alcohol use: Yes    Alcohol/week: 0.0 standard drinks    Comment: Once a month  . Drug use: No  . Sexual activity: Never  Other Topics Concern  . Not on file  Social History Narrative   Alcohol- 1 to 2 per month.    Social Determinants of Health   Financial Resource Strain:   . Difficulty of Paying Living Expenses: Not on file  Food Insecurity:   . Worried About Charity fundraiser in the Last Year: Not on file  . Ran Out of Food in the Last Year: Not on file  Transportation Needs:   . Lack of Transportation (Medical): Not on file  . Lack of Transportation (Non-Medical): Not on file  Physical Activity:   . Days of Exercise per Week: Not on file  . Minutes of Exercise per Session: Not on file  Stress:   . Feeling of Stress : Not on file  Social Connections:   . Frequency of Communication with Friends and Family: Not on file  . Frequency of Social Gatherings with Friends and Family: Not on file  . Attends Religious Services: Not on file  . Active Member of Clubs or Organizations: Not on file  . Attends Archivist Meetings: Not on file  . Marital Status: Not on file    Review of Systems - See HPI.  All other ROS are negative.  There were no vitals taken for this visit.  Physical Exam  Recent Results (from the past 2160 hour(s))  Cancer antigen 19-9     Status: None   Collection Time: 10/26/19 10:21 AM  Result Value Ref Range   CA 19-9 7 <34 U/mL    Comment: . This test was  performed using the Siemens  chemiluminescent method. Values obtained from different assay methods cannot be used interchangeably. CA 19-9 levels, regardless of value, should not be interpreted as absolute evidence of the presence or absence of disease. Marland Kitchen  Hemoglobin A1c     Status: Abnormal   Collection Time: 10/26/19 10:21 AM  Result Value Ref Range   Hgb A1c MFr Bld 9.1 (H) 4.6 - 6.5 %    Comment: Glycemic Control Guidelines for People with Diabetes:Non Diabetic:  <6%Goal of Therapy: <7%Additional Action Suggested:  >8%   CBC with Differential/Platelet     Status: Abnormal   Collection Time: 10/26/19 10:21 AM  Result Value Ref Range   WBC 6.9 4.0 - 10.5 K/uL   RBC 5.01 4.22 - 5.81 Mil/uL   Hemoglobin 15.8 13.0 - 17.0 g/dL   HCT 47.3 39 - 52 %   MCV 94.4 78.0 - 100.0 fl   MCHC 33.4 30.0 - 36.0 g/dL   RDW 16.7 (H) 11.5 - 15.5 %   Platelets 222.0 150 - 400 K/uL   Neutrophils Relative % 76.1 43 - 77 %   Lymphocytes Relative 13.0 12 - 46 %   Monocytes Relative 7.3 3 - 12 %   Eosinophils Relative 2.3 0 - 5 %   Basophils Relative 1.3 0 - 3 %   Neutro Abs 5.3 1.4 - 7.7 K/uL   Lymphs Abs 0.9 0.7 - 4.0 K/uL   Monocytes Absolute 0.5 0.1 - 1.0 K/uL   Eosinophils Absolute 0.2 0.0 - 0.7 K/uL   Basophils Absolute 0.1 0.0 - 0.1 K/uL  Hepatic function panel     Status: Abnormal   Collection Time: 10/26/19 10:21 AM  Result Value Ref Range   Total Bilirubin 2.0 (H) 0.2 - 1.2 mg/dL   Bilirubin, Direct 0.4 (H) 0.0 - 0.3 mg/dL   Alkaline Phosphatase 100 39 - 117 U/L   AST 13 0 - 37 U/L   ALT 12 0 - 53 U/L   Total Protein 5.8 (L) 6.0 - 8.3 g/dL   Albumin 3.6 3.5 - 5.2 g/dL  TSH     Status: None   Collection Time: 10/26/19 10:21 AM  Result Value Ref Range   TSH 3.43 0.35 - 4.50 uIU/mL  Basic metabolic panel     Status: Abnormal   Collection Time: 10/26/19 10:21 AM  Result Value Ref Range   Sodium 135 135 - 145 mEq/L   Potassium 3.6 3.5 - 5.1 mEq/L   Chloride 94 (L) 96 - 112 mEq/L    CO2 30 19 - 32 mEq/L   Glucose, Bld 210 (H) 70 - 99 mg/dL   BUN 22 6 - 23 mg/dL   Creatinine, Ser 1.26 0.40 - 1.50 mg/dL   GFR 55.80 (L) >60.00 mL/min   Calcium 9.2 8.4 - 10.5 mg/dL  Clostridium Difficile by PCR([facility])     Status: None   Collection Time: 10/27/19 10:30 AM   Specimen: Stool   Stool  Result Value Ref Range   Toxigenic C. Difficile by PCR Negative Negative  Salmonella/Shigella Cult, Campy EIA and Shiga Toxin reflex     Status: None   Collection Time: 10/27/19 10:30 AM  Result Value Ref Range   MICRO NUMBER [id number]    SPECIMEN QUALITY Adequate    SOURCE: STOOL    STATUS: FINAL    Result: Not Detected     Comment: Reference Range:Not Detected   MICRO NUMBER: [id number]    SPECIMEN QUALITY: Adequate    SOURCE: STOOL    STATUS: FINAL    SHIGA RESULT: Not Detected     Comment: Reference Range:Not Detected   MICRO NUMBER: [id number]    SPECIMEN QUALITY: Adequate    SOURCE: STOOL    STATUS:  FINAL    SS RESULT: No Salmonella or Shigella isolated   Lipase, blood     Status: None   Collection Time: 10/29/19  4:59 PM  Result Value Ref Range   Lipase 21 11 - 51 U/L    Comment: Performed at [facility], [address] [place], [place] [address]  Comprehensive metabolic panel     Status: Abnormal   Collection Time: 10/29/19  4:59 PM  Result Value Ref Range   Sodium 130 (L) 135 - 145 mmol/L   Potassium 2.9 (L) 3.5 - 5.1 mmol/L   Chloride 95 (L) 98 - 111 mmol/L   CO2 23 22 - 32 mmol/L   Glucose, Bld 165 (H) 70 - 99 mg/dL    Comment: Glucose reference range applies only to samples taken after fasting for at least 8 hours.   BUN 24 (H) 8 - 23 mg/dL   Creatinine, Ser 1.36 (H) 0.61 - 1.24 mg/dL   Calcium 8.9 8.9 - 10.3 mg/dL   Total Protein 6.7 6.5 - 8.1 g/dL   Albumin 3.4 (L) 3.5 - 5.0 g/dL   AST 20 15 - 41 U/L   ALT 15 0 - 44 U/L   Alkaline Phosphatase 78 38 - 126 U/L   Total Bilirubin 2.2 (H) 0.3 - 1.2 mg/dL   GFR calc non Af Amer 51 (L) >60  mL/min   GFR calc Af Amer 59 (L) >60 mL/min   Anion gap 12 5 - 15    Comment: Performed at [facility], [address] [place], [place] [address]  CBC     Status: None   Collection Time: 10/29/19  4:59 PM  Result Value Ref Range   WBC 9.1 4.0 - 10.5 K/uL   RBC 5.18 4.22 - 5.81 MIL/uL   Hemoglobin 16.1 13.0 - 17.0 g/dL   HCT 47.1 39 - 52 %   MCV 90.9 80.0 - 100.0 fL   MCH 31.1 26.0 - 34.0 pg   MCHC 34.2 30.0 - 36.0 g/dL   RDW 15.1 11.5 - 15.5 %   Platelets 234 150 - 400 K/uL   nRBC 0.0 0.0 - 0.2 %    Comment: Performed at [facility], [address] [place], [place] [address]  Magnesium     Status: None   Collection Time: 10/29/19  4:59 PM  Result Value Ref Range   Magnesium 1.8 1.7 - 2.4 mg/dL    Comment: Performed at [facility], [address] [place], [place] [address]  CBG monitoring, ED     Status: Abnormal   Collection Time: 11/09/19  6:31 PM  Result Value Ref Range   Glucose-Capillary 223 (H) 70 - 99 mg/dL    Comment: Glucose reference range applies only to samples taken after fasting for at least 8 hours.  Comprehensive metabolic panel     Status: Abnormal   Collection Time: 11/09/19  6:33 PM  Result Value Ref Range   Sodium 132 (L) 135 - 145 mmol/L   Potassium 3.3 (L) 3.5 - 5.1 mmol/L   Chloride 94 (L) 98 - 111 mmol/L   CO2 21 (L) 22 - 32 mmol/L   Glucose, Bld 259 (H) 70 - 99 mg/dL    Comment: Glucose reference range applies only to samples taken after fasting for at least 8 hours.   BUN 19 8 - 23 mg/dL   Creatinine, Ser 1.50 (H) 0.61 - 1.24 mg/dL  Calcium 9.3 8.9 - 10.3 mg/dL   Total Protein 6.8 6.5 - 8.1 g/dL   Albumin 3.3 (L) 3.5 - 5.0 g/dL   AST 25 15 - 41 U/L   ALT 15 0 - 44 U/L   Alkaline Phosphatase 92 38 - 126 U/L   Total Bilirubin 1.4 (H) 0.3 - 1.2 mg/dL   GFR calc non Af Amer 45 (L) >60 mL/min   GFR calc Af Amer 52 (L) >60 mL/min   Anion gap 17 (H) 5 - 15    Comment: Performed at [facility], [address] [place], [place] [address]  CBC with Differential     Status: Abnormal   Collection Time: 11/09/19  6:33 PM  Result Value Ref Range   WBC 16.7 (H) 4.0 - 10.5 K/uL   RBC 5.14 4.22 - 5.81 MIL/uL   Hemoglobin 16.3 13.0 - 17.0 g/dL   HCT 46.7 39 - 52 %   MCV 90.9 80.0 - 100.0 fL   MCH 31.7 26.0 - 34.0 pg   MCHC 34.9 30.0 - 36.0 g/dL   RDW 15.5 11.5 - 15.5 %   Platelets 277 150 - 400 K/uL   nRBC 0.0 0.0 - 0.2 %   Neutrophils Relative % 91 %   Neutro Abs 15.3 (H) 1.7 - 7.7 K/uL   Lymphocytes Relative 3 %   Lymphs Abs 0.5 (L) 0.7 - 4.0 K/uL   Monocytes Relative 5 %   Monocytes Absolute 0.8 0.1 - 1.0 K/uL   Eosinophils Relative 0 %   Eosinophils Absolute 0.0 0.0 - 0.5 K/uL   Basophils Relative 0 %   Basophils Absolute 0.1 0.0 - 0.1 K/uL   Immature Granulocytes 1 %   Abs Immature Granulocytes 0.12 (H) 0.00 - 0.07 K/uL    Comment: Performed at [facility], [address] [place], [place] [address]  Troponin I (High Sensitivity)     Status: None   Collection Time: 11/09/19  6:33 PM  Result Value Ref Range   Troponin I (High Sensitivity) 14 <18 ng/L    Comment: (NOTE) Elevated high sensitivity troponin I (hsTnI) values and significant  changes across serial measurements may suggest ACS but many other  chronic and acute conditions are known to elevate hsTnI results.  Refer to the "Links" section for chest pain algorithms and additional  guidance. Performed at [facility], [address] [place], [place] [address]   Lactic acid, plasma     Status: Abnormal   Collection Time: 11/09/19  6:33 PM  Result Value Ref Range   Lactic Acid, Venous 3.0 (HH) 0.5 - 1.9 mmol/L    Comment: CRITICAL RESULT CALLED TO, READ BACK BY AND VERIFIED WITH: SIMMS MARVA, RN @ [place] ON 11/09/2019, CABELLERO.P Performed at [facility], [address] [place], [place] [address]   Lipase, blood     Status: None   Collection Time: 11/09/19  6:33 PM  Result  Value Ref Range   Lipase 22 11 - 51 U/L    Comment: Performed at [facility], [address] [place], [place] [address]  Respiratory Panel by RT PCR (Flu A&B, Covid) - Nasopharyngeal Swab     Status: None   Collection Time: 11/09/19  7:37 PM   Specimen: Nasopharyngeal Swab  Result Value Ref Range   SARS Coronavirus 2 by RT PCR NEGATIVE NEGATIVE    Comment: (NOTE) SARS-CoV-2 target nucleic acids are NOT  DETECTED.  The SARS-CoV-2 RNA is generally detectable in upper respiratoy specimens during the acute phase of infection. The lowest concentration of SARS-CoV-2 viral copies this assay can detect is 131 copies/mL. A negative result does not preclude SARS-Cov-2 infection and should not be used as the sole basis for treatment or other patient management decisions. A negative result may occur with  improper specimen collection/handling, submission of specimen other than nasopharyngeal swab, presence of viral mutation(s) within the areas targeted by this assay, and inadequate number of viral copies (<131 copies/mL). A negative result must be combined with clinical observations, patient history, and epidemiological information. The expected result is Negative.  Fact Sheet for Patients:  PinkCheek.be  Fact Sheet for Healthcare Providers:  GravelBags.it  This test is no t yet approved or cleared by the [place] FDA and  has been authorized for detection and/or diagnosis of SARS-CoV-2 by FDA under an Emergency Use Authorization (EUA). This EUA will remain  in effect (meaning this test can be used) for the duration of the COVID-19 declaration under Section 564(b)(1) of the Act, 21 U.S.C. section 360bbb-3(b)(1), unless the authorization is terminated or revoked sooner.     Influenza A by PCR NEGATIVE NEGATIVE   Influenza B by PCR NEGATIVE NEGATIVE    Comment: (NOTE) The Xpert Xpress SARS-CoV-2/FLU/RSV assay is  intended as an aid in  the diagnosis of influenza from Nasopharyngeal swab specimens and  should not be used as a sole basis for treatment. Nasal washings and  aspirates are unacceptable for Xpert Xpress SARS-CoV-2/FLU/RSV  testing.  Fact Sheet for Patients: PinkCheek.be  Fact Sheet for Healthcare Providers: GravelBags.it  This test is not yet approved or cleared by the [place] FDA and  has been authorized for detection and/or diagnosis of SARS-CoV-2 by  FDA under an Emergency Use Authorization (EUA). This EUA will remain  in effect (meaning this test can be used) for the duration of the  Covid-19 declaration under Section 564(b)(1) of the Act, 21  U.S.C. section 360bbb-3(b)(1), unless the authorization is  terminated or revoked. Performed at [facility], [address] [place], [place] [address]   Urinalysis, Routine w reflex microscopic Nasopharyngeal Swab     Status: Abnormal   Collection Time: 11/09/19  7:37 PM  Result Value Ref Range   Color, Urine YELLOW YELLOW   APPearance CLEAR CLEAR   Specific Gravity, Urine <1.005 (L) 1.005 - 1.030   pH 5.5 5.0 - 8.0   Glucose, UA >=500 (A) NEGATIVE mg/dL   Hgb urine dipstick NEGATIVE NEGATIVE   Bilirubin Urine NEGATIVE NEGATIVE   Ketones, ur NEGATIVE NEGATIVE mg/dL   Protein, ur NEGATIVE NEGATIVE mg/dL   Nitrite NEGATIVE NEGATIVE   Leukocytes,Ua NEGATIVE NEGATIVE    Comment: Performed at [facility], [address] [place], [place] [address]  Urine culture     Status: Abnormal   Collection Time: 11/09/19  7:37 PM   Specimen: Urine, Random  Result Value Ref Range   Specimen Description      URINE, RANDOM Performed at [facility], [address] [place],  [address]    Special Requests      NONE Performed at [facility], [address] [place], [place] [address]    Culture >=100,000 COLONIES/mL ESCHERICHIA  COLI (A)    Report Status 11/11/2019 FINAL    Organism ID, Bacteria ESCHERICHIA COLI (A)       Susceptibility   Escherichia  coli - MIC*    AMPICILLIN >=32 RESISTANT Resistant     CEFAZOLIN <=4 SENSITIVE Sensitive     CEFTRIAXONE <=0.25 SENSITIVE Sensitive     CIPROFLOXACIN >=4 RESISTANT Resistant     GENTAMICIN <=1 SENSITIVE Sensitive     IMIPENEM <=0.25 SENSITIVE Sensitive     NITROFURANTOIN <=16 SENSITIVE Sensitive     TRIMETH/SULFA >=320 RESISTANT Resistant     AMPICILLIN/SULBACTAM 16 INTERMEDIATE Intermediate     PIP/TAZO <=4 SENSITIVE Sensitive     * >=100,000 COLONIES/mL ESCHERICHIA COLI  Urinalysis, Microscopic (reflex)     Status: Abnormal   Collection Time: 11/09/19  7:37 PM  Result Value Ref Range   RBC / HPF 0-5 0 - 5 RBC/hpf   WBC, UA 0-5 0 - 5 WBC/hpf   Bacteria, UA RARE (A) NONE SEEN   Squamous Epithelial / LPF 0-5 0 - 5    Comment: Performed at [facility], [address] [place], [place] [address]  Culture, blood (routine x 2)     Status: None   Collection Time: 11/09/19  8:01 PM   Specimen: BLOOD RIGHT HAND  Result Value Ref Range   Specimen Description      BLOOD RIGHT HAND Performed at [facility], [address] [place], [place] [address]    Special Requests      BOTTLES DRAWN AEROBIC AND ANAEROBIC Blood Culture results may not be optimal due to an inadequate volume of blood received in culture bottles Performed at [facility], [address] [place], [place] [address]    Culture      NO GROWTH 5 DAYS Performed at [facility], [place] [address], [place], [place] [address]    Report Status 11/14/2019 FINAL   Culture, blood (routine x 2)     Status: None   Collection Time: 11/09/19  8:10 PM   Specimen: Right Antecubital; Blood  Result Value Ref Range   Specimen Description      RIGHT ANTECUBITAL Performed at [facility], [address] [place], [place] [address]    Special Requests      BOTTLES  DRAWN AEROBIC AND ANAEROBIC Blood Culture adequate volume Performed at [facility], [address] [place], [place] [address]    Culture      NO GROWTH 5 DAYS Performed at [facility], [place] [address] [place], [place] [address]    Report Status 11/14/2019 FINAL   Lactic acid, plasma     Status: Abnormal   Collection Time: 11/09/19 10:39 PM  Result Value Ref Range   Lactic Acid, Venous 2.0 (HH) 0.5 - 1.9 mmol/L    Comment: CRITICAL RESULT CALLED TO, READ BACK BY AND VERIFIED WITH: NEAL,K AT 2257 ON [id number] BY CHERESNOWSKY,T Performed at [facility], [address] [place], [place] [address]   CBG monitoring, ED     Status: Abnormal   Collection Time: 11/10/19 12:06 AM  Result Value Ref Range   Glucose-Capillary 255 (H) 70 - 99 mg/dL    Comment: Glucose reference range applies only to samples taken after fasting for at least 8 hours.  Glucose, capillary     Status: Abnormal   Collection Time: 11/10/19  3:19 AM  Result Value Ref Range   Glucose-Capillary 196 (H) 70 - 99 mg/dL    Comment: Glucose reference range applies only to samples taken after fasting for at least 8 hours.  Aerobic/Anaerobic Culture (surgical/deep wound)     Status: None   Collection Time: 11/10/19  3:30 AM   Specimen: Abscess  Result Value Ref Range   Specimen Description      ABSCESS Performed at [facility] [address] [place], [place] [address]    Special Requests      NONE Performed at [facility], [place] [address] [place], [place] [address]    Gram Stain      FEW WBC SEEN FEW GRAM POSITIVE COCCI Performed at [facility], [place] [address] [place], [place] [address]    Culture      RARE ESCHERICHIA COLI RARE VIRIDANS STREPTOCOCCUS RARE BACTEROIDES FRAGILIS RARE BACTEROIDES OVATUS BETA LACTAMASE POSITIVE Performed at [facility], [place] [address], [place], [place] [address]    Report Status  11/13/2019 FINAL    Organism ID, Bacteria ESCHERICHIA COLI    Organism ID, Bacteria VIRIDANS STREPTOCOCCUS       Susceptibility   Escherichia coli - MIC*    AMPICILLIN >=32 RESISTANT Resistant     CEFAZOLIN <=4 SENSITIVE Sensitive     CEFEPIME <=0.12 SENSITIVE Sensitive     CEFTAZIDIME <=1 SENSITIVE Sensitive     CEFTRIAXONE <=0.25 SENSITIVE Sensitive     CIPROFLOXACIN >=4 RESISTANT Resistant     GENTAMICIN <=1 SENSITIVE Sensitive     IMIPENEM <=0.25 SENSITIVE Sensitive     TRIMETH/SULFA >=320 RESISTANT Resistant     AMPICILLIN/SULBACTAM 16 INTERMEDIATE Intermediate     PIP/TAZO <=4 SENSITIVE Sensitive     * RARE ESCHERICHIA COLI   Viridans streptococcus - MIC*    PENICILLIN <=0.06 SENSITIVE Sensitive     CEFTRIAXONE 0.25 SENSITIVE Sensitive     ERYTHROMYCIN <=0.12 SENSITIVE Sensitive     LEVOFLOXACIN 0.5 SENSITIVE Sensitive     VANCOMYCIN 0.5 SENSITIVE Sensitive     * RARE VIRIDANS STREPTOCOCCUS  MRSA PCR Screening     Status: None   Collection Time: 11/10/19  4:00 AM   Specimen: Nasopharyngeal  Result Value Ref Range   MRSA by PCR NEGATIVE NEGATIVE    Comment:        The GeneXpert MRSA Assay (FDA approved for NASAL specimens only), is one component of a comprehensive MRSA colonization surveillance program. It is not intended to diagnose MRSA infection nor to guide or monitor treatment for MRSA infections. Performed at [facility], [place] [address], [place], [place] [address]   Glucose, capillary     Status: Abnormal   Collection Time: 11/10/19  7:27 AM  Result Value Ref Range   Glucose-Capillary 190 (H) 70 - 99 mg/dL    Comment: Glucose reference range applies only to samples taken after fasting for at least 8 hours.  Magnesium     Status: None   Collection Time: 11/10/19  9:58 AM  Result Value Ref Range   Magnesium 1.8 1.7 - 2.4 mg/dL    Comment: Performed at [facility], [place] [address] [place],  [address]  CBC WITH  DIFFERENTIAL     Status: Abnormal   Collection Time: 11/10/19  9:58 AM  Result Value Ref Range   WBC 16.3 (H) 4.0 - 10.5 K/uL   RBC 4.51 4.22 - 5.81 MIL/uL   Hemoglobin 14.3 13.0 - 17.0 g/dL   HCT 42.3 39 - 52 %   MCV 93.8 80.0 - 100.0 fL   MCH 31.7 26.0 - 34.0 pg   MCHC 33.8 30.0 - 36.0 g/dL  RDW 15.7 (H) 11.5 - 15.5 %   Platelets 223 150 - 400 K/uL   nRBC 0.0 0.0 - 0.2 %   Neutrophils Relative % 93 %   Neutro Abs 15.3 (H) 1.7 - 7.7 K/uL   Lymphocytes Relative 3 %   Lymphs Abs 0.4 (L) 0.7 - 4.0 K/uL   Monocytes Relative 3 %   Monocytes Absolute 0.5 0.1 - 1.0 K/uL   Eosinophils Relative 0 %   Eosinophils Absolute 0.0 0.0 - 0.5 K/uL   Basophils Relative 0 %   Basophils Absolute 0.0 0.0 - 0.1 K/uL   Immature Granulocytes 1 %   Abs Immature Granulocytes 0.12 (H) 0.00 - 0.07 K/uL    Comment: Performed at [facility], [place] [address] [place], [place] [address]  Comprehensive metabolic panel     Status: Abnormal   Collection Time: 11/10/19  9:58 AM  Result Value Ref Range   Sodium 130 (L) 135 - 145 mmol/L   Potassium 3.3 (L) 3.5 - 5.1 mmol/L   Chloride 96 (L) 98 - 111 mmol/L   CO2 23 22 - 32 mmol/L   Glucose, Bld 231 (H) 70 - 99 mg/dL    Comment: Glucose reference range applies only to samples taken after fasting for at least 8 hours.   BUN 18 8 - 23 mg/dL   Creatinine, Ser 1.22 0.61 - 1.24 mg/dL   Calcium 8.7 (L) 8.9 - 10.3 mg/dL   Total Protein 5.8 (L) 6.5 - 8.1 g/dL   Albumin 2.7 (L) 3.5 - 5.0 g/dL   AST 16 15 - 41 U/L   ALT 14 0 - 44 U/L   Alkaline Phosphatase 74 38 - 126 U/L   Total Bilirubin 1.4 (H) 0.3 - 1.2 mg/dL   GFR calc non Af Amer 58 (L) >60 mL/min   GFR calc Af Amer >60 >60 mL/min   Anion gap 11 5 - 15    Comment: Performed at [facility], [place] [address], [place], [place] [address]  TSH     Status: None   Collection Time: 11/10/19  9:58 AM  Result Value Ref Range   TSH 1.141 0.350 - 4.500 uIU/mL    Comment: Performed  by a 3rd Generation assay with a functional sensitivity of <=0.01 uIU/mL. Performed at [facility], [place] [address] [place], [place] [address]   Troponin I (High Sensitivity)     Status: None   Collection Time: 11/10/19  9:58 AM  Result Value Ref Range   Troponin I (High Sensitivity) 15 <18 ng/L    Comment: (NOTE) Elevated high sensitivity troponin I (hsTnI) values and significant  changes across serial measurements may suggest ACS but many other  chronic and acute conditions are known to elevate hsTnI results.  Refer to the "Links" section for chest pain algorithms and additional  guidance. Performed at [facility], [place] [address] [place],  [address]   Glucose, capillary     Status: Abnormal   Collection Time: 11/10/19 11:51 AM  Result Value Ref Range   Glucose-Capillary 211 (H) 70 - 99 mg/dL    Comment: Glucose reference range applies only to samples taken after fasting for at least 8 hours.  Glucose, capillary     Status: Abnormal   Collection Time: 11/10/19  4:26 PM  Result Value Ref Range   Glucose-Capillary 218 (H) 70 - 99 mg/dL    Comment: Glucose reference range applies only to samples taken after fasting for  at least 8 hours.  Glucose, capillary     Status: Abnormal   Collection Time: 11/10/19  7:59 PM  Result Value Ref Range   Glucose-Capillary 185 (H) 70 - 99 mg/dL    Comment: Glucose reference range applies only to samples taken after fasting for at least 8 hours.  Comprehensive metabolic panel     Status: Abnormal   Collection Time: 11/11/19  6:47 AM  Result Value Ref Range   Sodium 133 (L) 135 - 145 mmol/L   Potassium 3.4 (L) 3.5 - 5.1 mmol/L   Chloride 101 98 - 111 mmol/L   CO2 21 (L) 22 - 32 mmol/L   Glucose, Bld 165 (H) 70 - 99 mg/dL    Comment: Glucose reference range applies only to samples taken after fasting for at least 8 hours.   BUN 19 8 - 23 mg/dL   Creatinine, Ser 1.14 0.61 - 1.24 mg/dL   Calcium 8.4  (L) 8.9 - 10.3 mg/dL   Total Protein 5.4 (L) 6.5 - 8.1 g/dL   Albumin 2.6 (L) 3.5 - 5.0 g/dL   AST 16 15 - 41 U/L   ALT 12 0 - 44 U/L   Alkaline Phosphatase 70 38 - 126 U/L   Total Bilirubin 1.2 0.3 - 1.2 mg/dL   GFR calc non Af Amer >60 >60 mL/min   GFR calc Af Amer >60 >60 mL/min   Anion gap 11 5 - 15    Comment: Performed at [facility], [place] [address], [place], [place] [address]  CBC with Differential/Platelet     Status: Abnormal   Collection Time: 11/11/19  6:47 AM  Result Value Ref Range   WBC 15.1 (H) 4.0 - 10.5 K/uL   RBC 4.32 4.22 - 5.81 MIL/uL   Hemoglobin 13.7 13.0 - 17.0 g/dL   HCT 40.9 39 - 52 %   MCV 94.7 80.0 - 100.0 fL   MCH 31.7 26.0 - 34.0 pg   MCHC 33.5 30.0 - 36.0 g/dL   RDW 15.8 (H) 11.5 - 15.5 %   Platelets 211 150 - 400 K/uL   nRBC 0.0 0.0 - 0.2 %   Neutrophils Relative % 89 %   Neutro Abs 13.5 (H) 1.7 - 7.7 K/uL   Lymphocytes Relative 5 %   Lymphs Abs 0.7 0.7 - 4.0 K/uL   Monocytes Relative 5 %   Monocytes Absolute 0.7 0.1 - 1.0 K/uL   Eosinophils Relative 0 %   Eosinophils Absolute 0.0 0.0 - 0.5 K/uL   Basophils Relative 0 %   Basophils Absolute 0.0 0.0 - 0.1 K/uL   Immature Granulocytes 1 %   Abs Immature Granulocytes 0.14 (H) 0.00 - 0.07 K/uL    Comment: Performed at [facility], [place] [address], [place],  [address]  Glucose, capillary     Status: Abnormal   Collection Time: 11/11/19  7:37 AM  Result Value Ref Range   Glucose-Capillary 156 (H) 70 - 99 mg/dL    Comment: Glucose reference range applies only to samples taken after fasting for at least 8 hours.  Glucose, capillary     Status: Abnormal   Collection Time: 11/11/19 11:40 AM  Result Value Ref Range   Glucose-Capillary 132 (H) 70 - 99 mg/dL    Comment: Glucose reference range applies only to samples taken after fasting for at least 8 hours.  ECHOCARDIOGRAM COMPLETE     Status: None   Collection Time: 11/11/19  3:18 PM  Result Value  Ref  Range   Weight 4,846.59 oz   Height 70.984 in   BP 98/58 mmHg   Area-P 1/2 2.26 cm2  Glucose, capillary     Status: Abnormal   Collection Time: 11/11/19  5:41 PM  Result Value Ref Range   Glucose-Capillary 175 (H) 70 - 99 mg/dL    Comment: Glucose reference range applies only to samples taken after fasting for at least 8 hours.  Glucose, capillary     Status: Abnormal   Collection Time: 11/11/19 10:01 PM  Result Value Ref Range   Glucose-Capillary 202 (H) 70 - 99 mg/dL    Comment: Glucose reference range applies only to samples taken after fasting for at least 8 hours.  Basic metabolic panel     Status: Abnormal   Collection Time: 11/12/19  6:05 AM  Result Value Ref Range   Sodium 132 (L) 135 - 145 mmol/L   Potassium 3.8 3.5 - 5.1 mmol/L   Chloride 101 98 - 111 mmol/L   CO2 21 (L) 22 - 32 mmol/L   Glucose, Bld 133 (H) 70 - 99 mg/dL    Comment: Glucose reference range applies only to samples taken after fasting for at least 8 hours.   BUN 18 8 - 23 mg/dL   Creatinine, Ser 1.09 0.61 - 1.24 mg/dL   Calcium 8.8 (L) 8.9 - 10.3 mg/dL   GFR calc non Af Amer >60 >60 mL/min   GFR calc Af Amer >60 >60 mL/min   Anion gap 10 5 - 15    Comment: Performed at [facility], [place] [address] [place], [place] [address]  Magnesium     Status: None   Collection Time: 11/12/19  6:05 AM  Result Value Ref Range   Magnesium 1.9 1.7 - 2.4 mg/dL    Comment: Performed at [facility], [place] [address] [place], [place] [address]  CBC with Differential/Platelet     Status: Abnormal   Collection Time: 11/12/19  6:05 AM  Result Value Ref Range   WBC 10.3 4.0 - 10.5 K/uL   RBC 4.60 4.22 - 5.81 MIL/uL   Hemoglobin 14.4 13.0 - 17.0 g/dL   HCT 43.7 39 - 52 %   MCV 95.0 80.0 - 100.0 fL   MCH 31.3 26.0 - 34.0 pg   MCHC 33.0 30.0 - 36.0 g/dL   RDW 15.9 (H) 11.5 - 15.5 %   Platelets 235 150 - 400 K/uL   nRBC 0.0 0.0 - 0.2 %   Neutrophils Relative % 86 %   Neutro Abs 8.8  (H) 1.7 - 7.7 K/uL   Lymphocytes Relative 7 %   Lymphs Abs 0.7 0.7 - 4.0 K/uL   Monocytes Relative 4 %   Monocytes Absolute 0.4 0.1 - 1.0 K/uL   Eosinophils Relative 2 %   Eosinophils Absolute 0.2 0.0 - 0.5 K/uL   Basophils Relative 0 %   Basophils Absolute 0.0 0.0 - 0.1 K/uL   Immature Granulocytes 1 %   Abs Immature Granulocytes 0.10 (H) 0.00 - 0.07 K/uL    Comment: Performed at [facility], [place] [address] [place], [place] [address]  Glucose, capillary     Status: Abnormal   Collection Time: 11/12/19  7:09 AM  Result Value Ref Range   Glucose-Capillary 134 (H) 70 - 99 mg/dL    Comment: Glucose reference range applies only to samples taken after fasting for at least 8 hours.  Glucose, capillary     Status: Abnormal  Collection Time: 11/12/19 11:29 AM  Result Value Ref Range   Glucose-Capillary 134 (H) 70 - 99 mg/dL    Comment: Glucose reference range applies only to samples taken after fasting for at least 8 hours.  Glucose, capillary     Status: Abnormal   Collection Time: 11/12/19  5:39 PM  Result Value Ref Range   Glucose-Capillary 150 (H) 70 - 99 mg/dL    Comment: Glucose reference range applies only to samples taken after fasting for at least 8 hours.  Glucose, capillary     Status: Abnormal   Collection Time: 11/12/19  9:52 PM  Result Value Ref Range   Glucose-Capillary 154 (H) 70 - 99 mg/dL    Comment: Glucose reference range applies only to samples taken after fasting for at least 8 hours.  Basic metabolic panel     Status: Abnormal   Collection Time: 11/13/19  5:49 AM  Result Value Ref Range   Sodium 132 (L) 135 - 145 mmol/L   Potassium 4.0 3.5 - 5.1 mmol/L   Chloride 103 98 - 111 mmol/L   CO2 16 (L) 22 - 32 mmol/L   Glucose, Bld 127 (H) 70 - 99 mg/dL    Comment: Glucose reference range applies only to samples taken after fasting for at least 8 hours.   BUN 15 8 - 23 mg/dL   Creatinine, Ser 0.98 0.61 - 1.24 mg/dL   Calcium 8.3 (L) 8.9 - 10.3  mg/dL   GFR calc non Af Amer >60 >60 mL/min   GFR calc Af Amer >60 >60 mL/min   Anion gap 13 5 - 15    Comment: Performed at [facility], [place] [address], [place], [place] [address]  CBC with Differential/Platelet     Status: Abnormal   Collection Time: 11/13/19  5:49 AM  Result Value Ref Range   WBC 7.9 4.0 - 10.5 K/uL   RBC 4.52 4.22 - 5.81 MIL/uL   Hemoglobin 14.3 13.0 - 17.0 g/dL   HCT 41.6 39 - 52 %   MCV 92.0 80.0 - 100.0 fL   MCH 31.6 26.0 - 34.0 pg   MCHC 34.4 30.0 - 36.0 g/dL   RDW 15.9 (H) 11.5 - 15.5 %   Platelets 162 150 - 400 K/uL   nRBC 0.0 0.0 - 0.2 %   Neutrophils Relative % 76 %   Neutro Abs 6.2 1.7 - 7.7 K/uL   Lymphocytes Relative 11 %   Lymphs Abs 0.8 0.7 - 4.0 K/uL   Monocytes Relative 6 %   Monocytes Absolute 0.4 0.1 - 1.0 K/uL   Eosinophils Relative 4 %   Eosinophils Absolute 0.3 0.0 - 0.5 K/uL   Basophils Relative 1 %   Basophils Absolute 0.1 0.0 - 0.1 K/uL   Immature Granulocytes 2 %   Abs Immature Granulocytes 0.12 (H) 0.00 - 0.07 K/uL    Comment: Performed at [facility], [place] [address] [place], [place] [address]  Magnesium     Status: None   Collection Time: 11/13/19  5:49 AM  Result Value Ref Range   Magnesium 1.8 1.7 - 2.4 mg/dL    Comment: Performed at [facility] [place] [address], [place],  [address]  Glucose, capillary     Status: Abnormal   Collection Time: 11/13/19  8:09 AM  Result Value Ref Range   Glucose-Capillary 116 (H) 70 - 99 mg/dL    Comment: Glucose reference range applies only to samples taken after fasting  for at least 8 hours.  Glucose, capillary     Status: Abnormal   Collection Time: 11/13/19 11:41 AM  Result Value Ref Range   Glucose-Capillary 158 (H) 70 - 99 mg/dL    Comment: Glucose reference range applies only to samples taken after fasting for at least 8 hours.  Glucose, capillary     Status: Abnormal   Collection Time: 11/13/19  5:49 PM  Result Value  Ref Range   Glucose-Capillary 214 (H) 70 - 99 mg/dL    Comment: Glucose reference range applies only to samples taken after fasting for at least 8 hours.  Glucose, capillary     Status: Abnormal   Collection Time: 11/13/19 10:23 PM  Result Value Ref Range   Glucose-Capillary 169 (H) 70 - 99 mg/dL    Comment: Glucose reference range applies only to samples taken after fasting for at least 8 hours.  Glucose, capillary     Status: Abnormal   Collection Time: 11/14/19  7:42 AM  Result Value Ref Range   Glucose-Capillary 127 (H) 70 - 99 mg/dL    Comment: Glucose reference range applies only to samples taken after fasting for at least 8 hours.  Glucose, capillary     Status: Abnormal   Collection Time: 11/14/19 11:40 AM  Result Value Ref Range   Glucose-Capillary 138 (H) 70 - 99 mg/dL    Comment: Glucose reference range applies only to samples taken after fasting for at least 8 hours.  Glucose, capillary     Status: Abnormal   Collection Time: 11/14/19  4:51 PM  Result Value Ref Range   Glucose-Capillary 127 (H) 70 - 99 mg/dL    Comment: Glucose reference range applies only to samples taken after fasting for at least 8 hours.  Glucose, capillary     Status: Abnormal   Collection Time: 11/14/19  9:01 PM  Result Value Ref Range   Glucose-Capillary 148 (H) 70 - 99 mg/dL    Comment: Glucose reference range applies only to samples taken after fasting for at least 8 hours.  Glucose, capillary     Status: Abnormal   Collection Time: 11/15/19  7:29 AM  Result Value Ref Range   Glucose-Capillary 114 (H) 70 - 99 mg/dL    Comment: Glucose reference range applies only to samples taken after fasting for at least 8 hours.  Glucose, capillary     Status: Abnormal   Collection Time: 11/15/19 11:37 AM  Result Value Ref Range   Glucose-Capillary 139 (H) 70 - 99 mg/dL    Comment: Glucose reference range applies only to samples taken after fasting for at least 8 hours.  Glucose, capillary     Status:  Abnormal   Collection Time: 11/15/19  5:39 PM  Result Value Ref Range   Glucose-Capillary 160 (H) 70 - 99 mg/dL    Comment: Glucose reference range applies only to samples taken after fasting for at least 8 hours.  Glucose, capillary     Status: Abnormal   Collection Time: 11/15/19  9:19 PM  Result Value Ref Range   Glucose-Capillary 136 (H) 70 - 99 mg/dL    Comment: Glucose reference range applies only to samples taken after fasting for at least 8 hours.  Glucose, capillary     Status: Abnormal   Collection Time: 11/16/19  8:39 AM  Result Value Ref Range   Glucose-Capillary 114 (H) 70 - 99 mg/dL    Comment: Glucose reference range applies only to samples taken after fasting for at  least 8 hours.  Glucose, capillary     Status: Abnormal   Collection Time: 11/16/19 11:56 AM  Result Value Ref Range   Glucose-Capillary 164 (H) 70 - 99 mg/dL    Comment: Glucose reference range applies only to samples taken after fasting for at least 8 hours.  Glucose, capillary     Status: Abnormal   Collection Time: 11/16/19  4:28 PM  Result Value Ref Range   Glucose-Capillary 124 (H) 70 - 99 mg/dL    Comment: Glucose reference range applies only to samples taken after fasting for at least 8 hours.  Glucose, capillary     Status: Abnormal   Collection Time: 11/16/19  7:20 PM  Result Value Ref Range   Glucose-Capillary 188 (H) 70 - 99 mg/dL    Comment: Glucose reference range applies only to samples taken after fasting for at least 8 hours.   Comment 1 Notify RN    Comment 2 Document in Chart   Glucose, capillary     Status: Abnormal   Collection Time: 11/17/19  8:16 AM  Result Value Ref Range   Glucose-Capillary 127 (H) 70 - 99 mg/dL    Comment: Glucose reference range applies only to samples taken after fasting for at least 8 hours.  Respiratory Panel by RT PCR (Flu A&B, Covid) - Nasopharyngeal Swab     Status: None   Collection Time: 11/17/19 11:29 AM   Specimen: Nasopharyngeal Swab  Result  Value Ref Range   SARS Coronavirus 2 by RT PCR NEGATIVE NEGATIVE    Comment: (NOTE) SARS-CoV-2 target nucleic acids are NOT DETECTED.  The SARS-CoV-2 RNA is generally detectable in upper respiratoy specimens during the acute phase of infection. The lowest concentration of SARS-CoV-2 viral copies this assay can detect is 131 copies/mL. A negative result does not preclude SARS-Cov-2 infection and should not be used as the sole basis for treatment or other patient management decisions. A negative result may occur with  improper specimen collection/handling, submission of specimen other than nasopharyngeal swab, presence of viral mutation(s) within the areas targeted by this assay, and inadequate number of viral copies (<131 copies/mL). A negative result must be combined with clinical observations, patient history, and epidemiological information. The expected result is Negative.  Fact Sheet for Patients:  PinkCheek.be  Fact Sheet for Healthcare Providers:  GravelBags.it  This test is no t yet approved or cleared by the [place] FDA and  has been authorized for detection and/or diagnosis of SARS-CoV-2 by FDA under an Emergency Use Authorization (EUA). This EUA will remain  in effect (meaning this test can be used) for the duration of the COVID-19 declaration under Section 564(b)(1) of the Act, 21 U.S.C. section 360bbb-3(b)(1), unless the authorization is terminated or revoked sooner.     Influenza A by PCR NEGATIVE NEGATIVE   Influenza B by PCR NEGATIVE NEGATIVE    Comment: (NOTE) The Xpert Xpress SARS-CoV-2/FLU/RSV assay is intended as an aid in  the diagnosis of influenza from Nasopharyngeal swab specimens and  should not be used as a sole basis for treatment. Nasal washings and  aspirates are unacceptable for Xpert Xpress SARS-CoV-2/FLU/RSV  testing.  Fact Sheet for  Patients: PinkCheek.be  Fact Sheet for Healthcare Providers: GravelBags.it  This test is not yet approved or cleared by the [place] FDA and  has been authorized for detection and/or diagnosis of SARS-CoV-2 by  FDA under an Emergency Use Authorization (EUA). This EUA will remain  in effect (meaning this test can be  used) for the duration of the  Covid-19 declaration under Section 564(b)(1) of the Act, 21  U.S.C. section 360bbb-3(b)(1), unless the authorization is  terminated or revoked. Performed at [facility], [place] [address], [place], [place] [address]   Glucose, capillary     Status: Abnormal   Collection Time: 11/17/19 12:44 PM  Result Value Ref Range   Glucose-Capillary 149 (H) 70 - 99 mg/dL    Comment: Glucose reference range applies only to samples taken after fasting for at least 8 hours.  Type and screen [place]     Status: None   Collection Time: 11/21/19 10:55 PM  Result Value Ref Range   ABO/RH(D) O POS    Antibody Screen NEG    Sample Expiration      11/24/2019,2359 Performed at [facility], [place] [address], [place], [place] [address]   Comprehensive metabolic panel     Status: Abnormal   Collection Time: 11/21/19 10:55 PM  Result Value Ref Range   Sodium 139 135 - 145 mmol/L   Potassium 3.2 (L) 3.5 - 5.1 mmol/L   Chloride 103 98 - 111 mmol/L   CO2 24 22 - 32 mmol/L   Glucose, Bld 142 (H) 70 - 99 mg/dL    Comment: Glucose reference range applies only to samples taken after fasting for at least 8 hours.   BUN 7 (L) 8 - 23 mg/dL   Creatinine, Ser 0.98 0.61 - 1.24 mg/dL   Calcium 8.8 (L) 8.9 - 10.3 mg/dL   Total Protein 5.9 (L) 6.5 - 8.1 g/dL   Albumin 2.9 (L) 3.5 - 5.0 g/dL   AST 22 15 - 41 U/L   ALT 13 0 - 44 U/L   Alkaline Phosphatase 66 38 - 126 U/L   Total Bilirubin 1.2 0.3 - 1.2 mg/dL   GFR, Estimated >60 >60 mL/min   Anion gap 12  5 - 15    Comment: Performed at [facility], [place] [address], [place], [place] [address]  CBC with Differential     Status: Abnormal   Collection Time: 11/21/19 10:55 PM  Result Value Ref Range   WBC 7.3 4.0 - 10.5 K/uL   RBC 4.80 4.22 - 5.81 MIL/uL   Hemoglobin 15.0 13.0 - 17.0 g/dL   HCT 45.2 39 - 52 %   MCV 94.2 80.0 - 100.0 fL   MCH 31.3 26.0 - 34.0 pg   MCHC 33.2 30.0 - 36.0 g/dL   RDW 17.0 (H) 11.5 - 15.5 %   Platelets 254 150 - 400 K/uL   nRBC 0.0 0.0 - 0.2 %   Neutrophils Relative % 74 %   Neutro Abs 5.4 1.7 - 7.7 K/uL   Lymphocytes Relative 15 %   Lymphs Abs 1.1 0.7 - 4.0 K/uL   Monocytes Relative 6 %   Monocytes Absolute 0.5 0.1 - 1.0 K/uL   Eosinophils Relative 3 %   Eosinophils Absolute 0.2 0.0 - 0.5 K/uL   Basophils Relative 1 %   Basophils Absolute 0.1 0.0 - 0.1 K/uL   Immature Granulocytes 1 %   Abs Immature Granulocytes 0.06 0.00 - 0.07 K/uL    Comment: Performed at [facility], [place] [address] [place], [place] [address]  Lactic acid, plasma     Status: None   Collection Time: 11/21/19 11:48 PM  Result Value Ref Range   Lactic Acid, Venous 1.3 0.5 - 1.9 mmol/L    Comment: Performed at  [facility], [place] [address], [place], [place] [address]  Lactic acid, plasma     Status: Abnormal   Collection Time: 11/22/19  1:19 AM  Result Value Ref Range   Lactic Acid, Venous 2.2 (HH) 0.5 - 1.9 mmol/L    Comment: CRITICAL RESULT CALLED TO, READ BACK BY AND VERIFIED WITHLenna [facility]  AT 0245 ON 11/22/19 BY A,MOHAMED Performed at [facility], [place] [address], [place], [place] [address]   Protime-INR     Status: Abnormal   Collection Time: 11/22/19  1:35 AM  Result Value Ref Range   Prothrombin Time 16.0 (H) 11.4 - 15.2 seconds   INR 1.3 (H) 0.8 - 1.2    Comment: (NOTE) INR goal varies based on device and disease states. Performed at [facility], [place] [address], [place], [place] [address]   Hemoglobin     Status: None   Collection Time: 11/22/19  5:00 AM  Result Value Ref Range   Hemoglobin 13.4 13.0 - 17.0 g/dL    Comment: Performed at [facility], [place] [address] [place], [place] [address]  CBG monitoring, ED     Status: Abnormal   Collection Time: 11/22/19  8:08 AM  Result Value Ref Range   Glucose-Capillary 135 (H) 70 - 99 mg/dL    Comment: Glucose reference range applies only to samples taken after fasting for at least 8 hours.  CBG monitoring, ED     Status: Abnormal   Collection Time: 11/22/19 11:36 AM  Result Value Ref Range   Glucose-Capillary 131 (H) 70 - 99 mg/dL    Comment: Glucose reference range applies only to samples taken after fasting for at least 8 hours.  Comprehensive metabolic panel Once     Status: Abnormal   Collection Time: 11/22/19  2:13 PM  Result Value Ref Range   Sodium 138 135 - 145 mmol/L   Potassium 3.2 (L) 3.5 - 5.1 mmol/L   Chloride 106 98 - 111 mmol/L   CO2 23 22 - 32 mmol/L   Glucose, Bld 145 (H) 70 - 99 mg/dL    Comment: Glucose reference range applies only to samples taken after fasting for at least 8 hours.   BUN 6 (L) 8 - 23 mg/dL   Creatinine, Ser 0.90 0.61 - 1.24 mg/dL   Calcium 8.2 (L) 8.9 - 10.3 mg/dL   Total Protein 5.3 (L) 6.5 - 8.1 g/dL   Albumin 2.6 (L) 3.5 - 5.0 g/dL   AST 20 15 - 41 U/L   ALT 12 0 - 44 U/L   Alkaline Phosphatase 53 38 - 126 U/L   Total Bilirubin 0.9 0.3 - 1.2 mg/dL   GFR, Estimated >60 >60 mL/min   Anion gap 9 5 - 15    Comment: Performed at [facility], [place] [address], [place], [place] [address]  Hemoglobin     Status: None   Collection Time: 11/22/19  2:13 PM  Result Value Ref Range   Hemoglobin 13.2 13.0 - 17.0 g/dL    Comment: Performed at [facility], [place] [address] [place],  [address]  CBG monitoring, ED     Status: Abnormal   Collection Time: 11/22/19  4:24 PM  Result Value Ref Range    Glucose-Capillary 119 (H) 70 - 99 mg/dL    Comment: Glucose reference range applies only to samples taken after fasting for at least 8 hours.  Hemoglobin     Status:  Abnormal   Collection Time: 11/22/19  9:17 PM  Result Value Ref Range   Hemoglobin 12.9 (L) 13.0 - 17.0 g/dL    Comment: Performed at [facility], [place] [address], [place], [place] [address]  Glucose, capillary     Status: Abnormal   Collection Time: 11/22/19 10:44 PM  Result Value Ref Range   Glucose-Capillary 114 (H) 70 - 99 mg/dL    Comment: Glucose reference range applies only to samples taken after fasting for at least 8 hours.  Basic metabolic panel     Status: Abnormal   Collection Time: 11/23/19 12:56 AM  Result Value Ref Range   Sodium 138 135 - 145 mmol/L   Potassium 4.4 3.5 - 5.1 mmol/L    Comment: DELTA CHECK NOTED SPECIMEN HEMOLYZED. HEMOLYSIS MAY AFFECT INTEGRITY OF RESULTS.    Chloride 104 98 - 111 mmol/L   CO2 22 22 - 32 mmol/L   Glucose, Bld 115 (H) 70 - 99 mg/dL    Comment: Glucose reference range applies only to samples taken after fasting for at least 8 hours.   BUN 6 (L) 8 - 23 mg/dL   Creatinine, Ser 0.85 0.61 - 1.24 mg/dL   Calcium 8.3 (L) 8.9 - 10.3 mg/dL   GFR, Estimated >60 >60 mL/min   Anion gap 12 5 - 15    Comment: Performed at [facility], [place] [address] [place], [place] [address]  CBC with Differential/Platelet     Status: Abnormal   Collection Time: 11/23/19 12:56 AM  Result Value Ref Range   WBC 6.5 4.0 - 10.5 K/uL   RBC 3.99 (L) 4.22 - 5.81 MIL/uL   Hemoglobin 12.7 (L) 13.0 - 17.0 g/dL   HCT 38.7 (L) 39 - 52 %   MCV 97.0 80.0 - 100.0 fL   MCH 31.8 26.0 - 34.0 pg   MCHC 32.8 30.0 - 36.0 g/dL   RDW 17.2 (H) 11.5 - 15.5 %   Platelets 242 150 - 400 K/uL   nRBC 0.0 0.0 - 0.2 %   Neutrophils Relative % 72 %   Neutro Abs 4.7 1.7 - 7.7 K/uL   Lymphocytes Relative 14 %   Lymphs Abs 0.9 0.7 - 4.0 K/uL   Monocytes Relative 8 %   Monocytes  Absolute 0.5 0.1 - 1.0 K/uL   Eosinophils Relative 4 %   Eosinophils Absolute 0.3 0.0 - 0.5 K/uL   Basophils Relative 1 %   Basophils Absolute 0.1 0.0 - 0.1 K/uL   Immature Granulocytes 1 %   Abs Immature Granulocytes 0.05 0.00 - 0.07 K/uL    Comment: Performed at [facility], [place] [address], [place], [place] [address]  Lactic acid, plasma     Status: None   Collection Time: 11/23/19 12:56 AM  Result Value Ref Range   Lactic Acid, Venous 1.1 0.5 - 1.9 mmol/L    Comment: Performed at [facility], [place] [address], [place], [place] [address]  Hemoglobin     Status: None   Collection Time: 11/23/19  7:03 AM  Result Value Ref Range   Hemoglobin 13.3 13.0 - 17.0 g/dL    Comment: Performed at [facility], [place] [address] [place],  [address]  Glucose, capillary     Status: Abnormal   Collection Time: 11/23/19  7:55 AM  Result Value Ref Range   Glucose-Capillary 103 (H) 70 - 99 mg/dL    Comment: Glucose reference range applies only to samples  taken after fasting for at least 8 hours.  Glucose, capillary     Status: Abnormal   Collection Time: 11/23/19 11:18 AM  Result Value Ref Range   Glucose-Capillary 107 (H) 70 - 99 mg/dL    Comment: Glucose reference range applies only to samples taken after fasting for at least 8 hours.  Hemoglobin     Status: None   Collection Time: 11/23/19 12:54 PM  Result Value Ref Range   Hemoglobin 15.0 13.0 - 17.0 g/dL    Comment: Performed at [facility], [place] [address], [place], [place] [address]  Glucose, capillary     Status: Abnormal   Collection Time: 11/23/19  4:38 PM  Result Value Ref Range   Glucose-Capillary 136 (H) 70 - 99 mg/dL    Comment: Glucose reference range applies only to samples taken after fasting for at least 8 hours.  Hemoglobin     Status: Abnormal   Collection Time: 11/23/19  6:27 PM  Result Value Ref Range   Hemoglobin 12.8 (L) 13.0 - 17.0 g/dL     Comment: Performed at [facility], [place] [address] [place], [place] [address]  Glucose, capillary     Status: Abnormal   Collection Time: 11/23/19  8:33 PM  Result Value Ref Range   Glucose-Capillary 125 (H) 70 - 99 mg/dL    Comment: Glucose reference range applies only to samples taken after fasting for at least 8 hours.  Basic metabolic panel     Status: Abnormal   Collection Time: 11/24/19 12:58 AM  Result Value Ref Range   Sodium 138 135 - 145 mmol/L   Potassium 3.7 3.5 - 5.1 mmol/L   Chloride 104 98 - 111 mmol/L   CO2 21 (L) 22 - 32 mmol/L   Glucose, Bld 112 (H) 70 - 99 mg/dL    Comment: Glucose reference range applies only to samples taken after fasting for at least 8 hours.   BUN 9 8 - 23 mg/dL   Creatinine, Ser 0.92 0.61 - 1.24 mg/dL   Calcium 8.6 (L) 8.9 - 10.3 mg/dL   GFR, Estimated >60 >60 mL/min   Anion gap 13 5 - 15    Comment: Performed at [facility], [place] [address] [place], [place] [address]  CBC with Differential/Platelet     Status: Abnormal   Collection Time: 11/24/19 12:58 AM  Result Value Ref Range   WBC 6.6 4.0 - 10.5 K/uL   RBC 3.82 (L) 4.22 - 5.81 MIL/uL   Hemoglobin 12.2 (L) 13.0 - 17.0 g/dL   HCT 37.1 (L) 39 - 52 %   MCV 97.1 80.0 - 100.0 fL   MCH 31.9 26.0 - 34.0 pg   MCHC 32.9 30.0 - 36.0 g/dL   RDW 17.4 (H) 11.5 - 15.5 %   Platelets 244 150 - 400 K/uL   nRBC 0.0 0.0 - 0.2 %   Neutrophils Relative % 71 %   Neutro Abs 4.7 1.7 - 7.7 K/uL   Lymphocytes Relative 16 %   Lymphs Abs 1.0 0.7 - 4.0 K/uL   Monocytes Relative 7 %   Monocytes Absolute 0.5 0.1 - 1.0 K/uL   Eosinophils Relative 4 %   Eosinophils Absolute 0.3 0.0 - 0.5 K/uL   Basophils Relative 1 %   Basophils Absolute 0.1 0.0 - 0.1 K/uL   Immature Granulocytes 1 %   Abs Immature Granulocytes 0.05 0.00 - 0.07 K/uL    Comment: Performed at [facility],  [place] [address], [place], [place] [address]  Hemoglobin     Status: Abnormal    Collection Time: 11/24/19  7:43 AM  Result Value Ref Range   Hemoglobin 11.8 (L) 13.0 - 17.0 g/dL    Comment: Performed at [facility], [place] [address] [place], [place] [address]  Glucose, capillary     Status: None   Collection Time: 11/24/19  7:51 AM  Result Value Ref Range   Glucose-Capillary 93 70 - 99 mg/dL    Comment: Glucose reference range applies only to samples taken after fasting for at least 8 hours.  Glucose, capillary     Status: Abnormal   Collection Time: 11/24/19 11:54 AM  Result Value Ref Range   Glucose-Capillary 125 (H) 70 - 99 mg/dL    Comment: Glucose reference range applies only to samples taken after fasting for at least 8 hours.  Respiratory Panel by RT PCR (Flu A&B, Covid) - Nasopharyngeal Swab     Status: None   Collection Time: 11/24/19  1:56 PM   Specimen: Nasopharyngeal Swab  Result Value Ref Range   SARS Coronavirus 2 by RT PCR NEGATIVE NEGATIVE    Comment: (NOTE) SARS-CoV-2 target nucleic acids are NOT DETECTED.  The SARS-CoV-2 RNA is generally detectable in upper respiratoy specimens during the acute phase of infection. The lowest concentration of SARS-CoV-2 viral copies this assay can detect is 131 copies/mL. A negative result does not preclude SARS-Cov-2 infection and should not be used as the sole basis for treatment or other patient management decisions. A negative result may occur with  improper specimen collection/handling, submission of specimen other than nasopharyngeal swab, presence of viral mutation(s) within the areas targeted by this assay, and inadequate number of viral copies (<131 copies/mL). A negative result must be combined with clinical observations, patient history, and epidemiological information. The expected result is Negative.  Fact Sheet for Patients:  PinkCheek.be  Fact Sheet for Healthcare Providers:  GravelBags.it  This test is no t yet  approved or cleared by the [place] FDA and  has been authorized for detection and/or diagnosis of SARS-CoV-2 by FDA under an Emergency Use Authorization (EUA). This EUA will remain  in effect (meaning this test can be used) for the duration of the COVID-19 declaration under Section 564(b)(1) of the Act, 21 U.S.C. section 360bbb-3(b)(1), unless the authorization is terminated or revoked sooner.     Influenza A by PCR NEGATIVE NEGATIVE   Influenza B by PCR NEGATIVE NEGATIVE    Comment: (NOTE) The Xpert Xpress SARS-CoV-2/FLU/RSV assay is intended as an aid in  the diagnosis of influenza from Nasopharyngeal swab specimens and  should not be used as a sole basis for treatment. Nasal washings and  aspirates are unacceptable for Xpert Xpress SARS-CoV-2/FLU/RSV  testing.  Fact Sheet for Patients: PinkCheek.be  Fact Sheet for Healthcare Providers: GravelBags.it  This test is not yet approved or cleared by the [place] FDA and  has been authorized for detection and/or diagnosis of SARS-CoV-2 by  FDA under an Emergency Use Authorization (EUA). This EUA will remain  in effect (meaning this test can be used) for the duration of the  Covid-19 declaration under Section 564(b)(1) of the Act, 21  U.S.C. section 360bbb-3(b)(1), unless the authorization is  terminated or revoked. Performed at [facility], [place] [address], [place], [place] [address]   Glucose, capillary     Status: Abnormal   Collection Time: 11/24/19  4:00 PM  Result Value Ref Range  Glucose-Capillary 106 (H) 70 - 99 mg/dL    Comment: Glucose reference range applies only to samples taken after fasting for at least 8 hours.  Glucose, capillary     Status: Abnormal   Collection Time: 11/24/19  8:23 PM  Result Value Ref Range   Glucose-Capillary 120 (H) 70 - 99 mg/dL    Comment: Glucose reference range applies only to samples taken after  fasting for at least 8 hours.  Glucose, capillary     Status: None   Collection Time: 11/25/19  7:31 AM  Result Value Ref Range   Glucose-Capillary 90 70 - 99 mg/dL    Comment: Glucose reference range applies only to samples taken after fasting for at least 8 hours.  Basic metabolic panel     Status: Abnormal   Collection Time: 11/25/19  9:19 AM  Result Value Ref Range   Sodium 136 135 - 145 mmol/L   Potassium 3.8 3.5 - 5.1 mmol/L   Chloride 102 98 - 111 mmol/L   CO2 19 (L) 22 - 32 mmol/L   Glucose, Bld 98 70 - 99 mg/dL    Comment: Glucose reference range applies only to samples taken after fasting for at least 8 hours.   BUN 9 8 - 23 mg/dL   Creatinine, Ser 0.80 0.61 - 1.24 mg/dL   Calcium 8.5 (L) 8.9 - 10.3 mg/dL   GFR, Estimated >60 >60 mL/min   Anion gap 15 5 - 15    Comment: Performed at [facility], [place] [address] [place], [place] [address]  CBC with Differential/Platelet     Status: Abnormal   Collection Time: 11/25/19  9:19 AM  Result Value Ref Range   WBC 5.9 4.0 - 10.5 K/uL   RBC 4.00 (L) 4.22 - 5.81 MIL/uL   Hemoglobin 12.8 (L) 13.0 - 17.0 g/dL   HCT 39.2 39 - 52 %   MCV 98.0 80.0 - 100.0 fL   MCH 32.0 26.0 - 34.0 pg   MCHC 32.7 30.0 - 36.0 g/dL   RDW 17.4 (H) 11.5 - 15.5 %   Platelets 223 150 - 400 K/uL   nRBC 0.0 0.0 - 0.2 %   Neutrophils Relative % 68 %   Neutro Abs 4.0 1.7 - 7.7 K/uL   Lymphocytes Relative 17 %   Lymphs Abs 1.0 0.7 - 4.0 K/uL   Monocytes Relative 8 %   Monocytes Absolute 0.5 0.1 - 1.0 K/uL   Eosinophils Relative 5 %   Eosinophils Absolute 0.3 0.0 - 0.5 K/uL   Basophils Relative 1 %   Basophils Absolute 0.1 0.0 - 0.1 K/uL   Immature Granulocytes 1 %   Abs Immature Granulocytes 0.03 0.00 - 0.07 K/uL    Comment: Performed at [facility], [place] [address] [place], [place] [address]  Glucose, capillary     Status: Abnormal   Collection Time: 11/25/19 11:46 AM  Result Value Ref Range    Glucose-Capillary 114 (H) 70 - 99 mg/dL    Comment: Glucose reference range applies only to samples taken after fasting for at least 8 hours.  Glucose, capillary     Status: Abnormal   Collection Time: 11/25/19  5:57 PM  Result Value Ref Range   Glucose-Capillary 113 (H) 70 - 99 mg/dL    Comment: Glucose reference range applies only to samples taken after fasting for at least 8 hours.  CBC and differential     Status: None   Collection Time: 12/02/19 12:00 AM  Result Value Ref Range   Hemoglobin 14.2 13.5 - 17.5   HCT 42 41 - 53   Platelets 255 150 - 399   WBC 4.6   CBC     Status: None   Collection Time: 12/02/19 12:00 AM  Result Value Ref Range   RBC 4.48 3.87 - 5.05  Basic metabolic panel     Status: Abnormal   Collection Time: 12/02/19 12:00 AM  Result Value Ref Range   Glucose 82    BUN 5 4 - 21   CO2 26 (A) 13 - 22   Creatinine 0.9 0.6 - 1.3   Potassium 3.9 3.4 - 5.3   Sodium 140 137 - 147   Chloride 99 99 - 108  Comprehensive metabolic panel     Status: None   Collection Time: 12/02/19 12:00 AM  Result Value Ref Range   GFR calc Af Amer >90    GFR calc non Af Amer 85.22    Calcium 9.4 8.7 - 10.7    Assessment/Plan: 1. Coccygeal pain After fall.  No overlying bruising.  Patient declines imaging today.  Contusion versus mild fracture.  Treatment will be supportive either way.  Is currently on meloxicam and okay to continue now that he is off of his blood thinners.  He is to take with food.  Tylenol for breakthrough pain.  Heating pad to the area recommended.  Donut cushion recommended to help alleviate pressure on the area.  Was given tizanidine ion SNF for back pain and spasm. Will refill once for him to help hopefully alleviated some of the muscular pain in the lower back.  Strict return precautions reviewed with patient and wife who voiced understanding and agreement with plan.  This visit occurred during the SARS-CoV-2 public health emergency.  Safety protocols were  in place, including screening questions prior to the visit, additional usage of staff PPE, and extensive cleaning of exam room while observing appropriate contact time as indicated for disinfecting solutions.     Leeanne Rio, PA-C

## 2019-12-28 NOTE — Patient Instructions (Signed)
Get a donut cushion to use when sitting.  Try to get up an move about when possible.  Heat to the area for 10-15 minutes a few times per day.  Continue the Meloxicam since you are not taking the Eliquis. Tylenol for breakthrough pain. Take 1/2 tablet of the tizanidine up to as directed to help with tension. If you note it makes you feel sleepy then we may need to stop.  Since you tolerated well recently the hope is that you will again.  Please keep [place] updated on how you are doing.

## 2019-12-29 DIAGNOSIS — R634 Abnormal weight loss: Secondary | ICD-10-CM | POA: Diagnosis not present

## 2019-12-29 DIAGNOSIS — I951 Orthostatic hypotension: Secondary | ICD-10-CM | POA: Diagnosis not present

## 2019-12-29 DIAGNOSIS — K529 Noninfective gastroenteritis and colitis, unspecified: Secondary | ICD-10-CM | POA: Diagnosis not present

## 2019-12-29 DIAGNOSIS — I10 Essential (primary) hypertension: Secondary | ICD-10-CM | POA: Diagnosis not present

## 2019-12-29 DIAGNOSIS — E876 Hypokalemia: Secondary | ICD-10-CM | POA: Diagnosis not present

## 2019-12-29 DIAGNOSIS — K219 Gastro-esophageal reflux disease without esophagitis: Secondary | ICD-10-CM | POA: Diagnosis not present

## 2019-12-29 DIAGNOSIS — E119 Type 2 diabetes mellitus without complications: Secondary | ICD-10-CM | POA: Diagnosis not present

## 2019-12-29 DIAGNOSIS — R1310 Dysphagia, unspecified: Secondary | ICD-10-CM | POA: Diagnosis not present

## 2019-12-29 DIAGNOSIS — I4891 Unspecified atrial fibrillation: Secondary | ICD-10-CM | POA: Diagnosis not present

## 2019-12-29 DIAGNOSIS — E861 Hypovolemia: Secondary | ICD-10-CM | POA: Diagnosis not present

## 2019-12-29 DIAGNOSIS — R630 Anorexia: Secondary | ICD-10-CM | POA: Diagnosis not present

## 2019-12-30 DIAGNOSIS — K529 Noninfective gastroenteritis and colitis, unspecified: Secondary | ICD-10-CM | POA: Diagnosis not present

## 2019-12-30 DIAGNOSIS — K224 Dyskinesia of esophagus: Secondary | ICD-10-CM | POA: Diagnosis not present

## 2019-12-30 DIAGNOSIS — B9620 Unspecified Escherichia coli [E. coli] as the cause of diseases classified elsewhere: Secondary | ICD-10-CM | POA: Diagnosis not present

## 2019-12-30 DIAGNOSIS — K219 Gastro-esophageal reflux disease without esophagitis: Secondary | ICD-10-CM | POA: Diagnosis not present

## 2019-12-30 DIAGNOSIS — I10 Essential (primary) hypertension: Secondary | ICD-10-CM | POA: Diagnosis not present

## 2019-12-30 DIAGNOSIS — E119 Type 2 diabetes mellitus without complications: Secondary | ICD-10-CM | POA: Diagnosis not present

## 2019-12-30 DIAGNOSIS — N390 Urinary tract infection, site not specified: Secondary | ICD-10-CM | POA: Diagnosis not present

## 2019-12-30 DIAGNOSIS — I951 Orthostatic hypotension: Secondary | ICD-10-CM | POA: Diagnosis not present

## 2019-12-31 DIAGNOSIS — I10 Essential (primary) hypertension: Secondary | ICD-10-CM | POA: Diagnosis not present

## 2019-12-31 DIAGNOSIS — K219 Gastro-esophageal reflux disease without esophagitis: Secondary | ICD-10-CM | POA: Diagnosis not present

## 2019-12-31 DIAGNOSIS — B9620 Unspecified Escherichia coli [E. coli] as the cause of diseases classified elsewhere: Secondary | ICD-10-CM | POA: Diagnosis not present

## 2019-12-31 DIAGNOSIS — I951 Orthostatic hypotension: Secondary | ICD-10-CM | POA: Diagnosis not present

## 2019-12-31 DIAGNOSIS — E119 Type 2 diabetes mellitus without complications: Secondary | ICD-10-CM | POA: Diagnosis not present

## 2019-12-31 DIAGNOSIS — N390 Urinary tract infection, site not specified: Secondary | ICD-10-CM | POA: Diagnosis not present

## 2020-01-01 ENCOUNTER — Ambulatory Visit: Payer: Medicare PPO | Admitting: Endocrinology

## 2020-01-01 DIAGNOSIS — R278 Other lack of coordination: Secondary | ICD-10-CM | POA: Diagnosis not present

## 2020-01-01 DIAGNOSIS — N390 Urinary tract infection, site not specified: Secondary | ICD-10-CM | POA: Diagnosis not present

## 2020-01-01 DIAGNOSIS — K611 Rectal abscess: Secondary | ICD-10-CM | POA: Diagnosis not present

## 2020-01-01 DIAGNOSIS — K529 Noninfective gastroenteritis and colitis, unspecified: Secondary | ICD-10-CM | POA: Diagnosis not present

## 2020-01-01 DIAGNOSIS — R2681 Unsteadiness on feet: Secondary | ICD-10-CM | POA: Diagnosis not present

## 2020-01-01 DIAGNOSIS — B9620 Unspecified Escherichia coli [E. coli] as the cause of diseases classified elsewhere: Secondary | ICD-10-CM | POA: Diagnosis not present

## 2020-01-01 DIAGNOSIS — L981 Factitial dermatitis: Secondary | ICD-10-CM | POA: Diagnosis not present

## 2020-01-01 DIAGNOSIS — Z743 Need for continuous supervision: Secondary | ICD-10-CM | POA: Diagnosis not present

## 2020-01-01 DIAGNOSIS — R159 Full incontinence of feces: Secondary | ICD-10-CM | POA: Diagnosis not present

## 2020-01-01 DIAGNOSIS — I480 Paroxysmal atrial fibrillation: Secondary | ICD-10-CM | POA: Diagnosis not present

## 2020-01-01 DIAGNOSIS — R55 Syncope and collapse: Secondary | ICD-10-CM | POA: Diagnosis not present

## 2020-01-01 DIAGNOSIS — K219 Gastro-esophageal reflux disease without esophagitis: Secondary | ICD-10-CM | POA: Diagnosis not present

## 2020-01-01 DIAGNOSIS — R6881 Early satiety: Secondary | ICD-10-CM | POA: Diagnosis not present

## 2020-01-01 DIAGNOSIS — R634 Abnormal weight loss: Secondary | ICD-10-CM | POA: Diagnosis not present

## 2020-01-01 DIAGNOSIS — E1165 Type 2 diabetes mellitus with hyperglycemia: Secondary | ICD-10-CM | POA: Diagnosis not present

## 2020-01-01 DIAGNOSIS — M069 Rheumatoid arthritis, unspecified: Secondary | ICD-10-CM | POA: Diagnosis not present

## 2020-01-01 DIAGNOSIS — K5730 Diverticulosis of large intestine without perforation or abscess without bleeding: Secondary | ICD-10-CM | POA: Diagnosis not present

## 2020-01-01 DIAGNOSIS — I5032 Chronic diastolic (congestive) heart failure: Secondary | ICD-10-CM | POA: Diagnosis not present

## 2020-01-01 DIAGNOSIS — S51012A Laceration without foreign body of left elbow, initial encounter: Secondary | ICD-10-CM | POA: Diagnosis not present

## 2020-01-01 DIAGNOSIS — E785 Hyperlipidemia, unspecified: Secondary | ICD-10-CM | POA: Diagnosis not present

## 2020-01-01 DIAGNOSIS — I10 Essential (primary) hypertension: Secondary | ICD-10-CM | POA: Diagnosis not present

## 2020-01-01 DIAGNOSIS — L218 Other seborrheic dermatitis: Secondary | ICD-10-CM | POA: Diagnosis not present

## 2020-01-01 DIAGNOSIS — R404 Transient alteration of awareness: Secondary | ICD-10-CM | POA: Diagnosis not present

## 2020-01-01 DIAGNOSIS — Z9181 History of falling: Secondary | ICD-10-CM | POA: Diagnosis not present

## 2020-01-01 DIAGNOSIS — M2550 Pain in unspecified joint: Secondary | ICD-10-CM | POA: Diagnosis not present

## 2020-01-01 DIAGNOSIS — Z7401 Bed confinement status: Secondary | ICD-10-CM | POA: Diagnosis not present

## 2020-01-01 DIAGNOSIS — Z4800 Encounter for change or removal of nonsurgical wound dressing: Secondary | ICD-10-CM | POA: Diagnosis not present

## 2020-01-01 DIAGNOSIS — I951 Orthostatic hypotension: Secondary | ICD-10-CM | POA: Diagnosis not present

## 2020-01-01 DIAGNOSIS — F0390 Unspecified dementia without behavioral disturbance: Secondary | ICD-10-CM | POA: Diagnosis not present

## 2020-01-01 DIAGNOSIS — S51011A Laceration without foreign body of right elbow, initial encounter: Secondary | ICD-10-CM | POA: Diagnosis not present

## 2020-01-01 DIAGNOSIS — Z79899 Other long term (current) drug therapy: Secondary | ICD-10-CM | POA: Diagnosis not present

## 2020-01-01 DIAGNOSIS — K603 Anal fistula: Secondary | ICD-10-CM | POA: Diagnosis not present

## 2020-01-01 DIAGNOSIS — L4050 Arthropathic psoriasis, unspecified: Secondary | ICD-10-CM | POA: Diagnosis not present

## 2020-01-01 DIAGNOSIS — Z20822 Contact with and (suspected) exposure to covid-19: Secondary | ICD-10-CM | POA: Diagnosis not present

## 2020-01-01 DIAGNOSIS — E119 Type 2 diabetes mellitus without complications: Secondary | ICD-10-CM | POA: Diagnosis not present

## 2020-01-01 DIAGNOSIS — I110 Hypertensive heart disease with heart failure: Secondary | ICD-10-CM | POA: Diagnosis not present

## 2020-01-01 DIAGNOSIS — R110 Nausea: Secondary | ICD-10-CM | POA: Diagnosis not present

## 2020-01-04 ENCOUNTER — Telehealth: Payer: Self-pay | Admitting: Family Medicine

## 2020-01-04 DIAGNOSIS — M069 Rheumatoid arthritis, unspecified: Secondary | ICD-10-CM | POA: Diagnosis not present

## 2020-01-04 DIAGNOSIS — E119 Type 2 diabetes mellitus without complications: Secondary | ICD-10-CM | POA: Diagnosis not present

## 2020-01-04 DIAGNOSIS — K219 Gastro-esophageal reflux disease without esophagitis: Secondary | ICD-10-CM | POA: Diagnosis not present

## 2020-01-04 DIAGNOSIS — E785 Hyperlipidemia, unspecified: Secondary | ICD-10-CM | POA: Diagnosis not present

## 2020-01-04 NOTE — Telephone Encounter (Signed)
Home health form placed in Dr. Virgil Benedict bin up front

## 2020-01-04 NOTE — Telephone Encounter (Signed)
Form faxed to [phone] and copy sent to scanning

## 2020-01-04 NOTE — Telephone Encounter (Signed)
Form completed and placed in basket  

## 2020-01-08 DIAGNOSIS — K529 Noninfective gastroenteritis and colitis, unspecified: Secondary | ICD-10-CM | POA: Diagnosis not present

## 2020-01-08 DIAGNOSIS — E119 Type 2 diabetes mellitus without complications: Secondary | ICD-10-CM | POA: Diagnosis not present

## 2020-01-08 DIAGNOSIS — I951 Orthostatic hypotension: Secondary | ICD-10-CM | POA: Diagnosis not present

## 2020-01-08 DIAGNOSIS — K219 Gastro-esophageal reflux disease without esophagitis: Secondary | ICD-10-CM | POA: Diagnosis not present

## 2020-01-11 DIAGNOSIS — L981 Factitial dermatitis: Secondary | ICD-10-CM | POA: Diagnosis not present

## 2020-01-11 DIAGNOSIS — L218 Other seborrheic dermatitis: Secondary | ICD-10-CM | POA: Diagnosis not present

## 2020-01-12 DIAGNOSIS — I5032 Chronic diastolic (congestive) heart failure: Secondary | ICD-10-CM | POA: Diagnosis not present

## 2020-01-12 DIAGNOSIS — Z4800 Encounter for change or removal of nonsurgical wound dressing: Secondary | ICD-10-CM | POA: Diagnosis not present

## 2020-01-12 DIAGNOSIS — E1165 Type 2 diabetes mellitus with hyperglycemia: Secondary | ICD-10-CM | POA: Diagnosis not present

## 2020-01-12 DIAGNOSIS — I110 Hypertensive heart disease with heart failure: Secondary | ICD-10-CM | POA: Diagnosis not present

## 2020-01-12 DIAGNOSIS — K5730 Diverticulosis of large intestine without perforation or abscess without bleeding: Secondary | ICD-10-CM | POA: Diagnosis not present

## 2020-01-12 DIAGNOSIS — K611 Rectal abscess: Secondary | ICD-10-CM | POA: Diagnosis not present

## 2020-01-12 DIAGNOSIS — L4050 Arthropathic psoriasis, unspecified: Secondary | ICD-10-CM | POA: Diagnosis not present

## 2020-01-12 DIAGNOSIS — I480 Paroxysmal atrial fibrillation: Secondary | ICD-10-CM | POA: Diagnosis not present

## 2020-01-12 DIAGNOSIS — F0390 Unspecified dementia without behavioral disturbance: Secondary | ICD-10-CM | POA: Diagnosis not present

## 2020-01-13 ENCOUNTER — Telehealth: Payer: Self-pay | Admitting: Family Medicine

## 2020-01-13 DIAGNOSIS — E119 Type 2 diabetes mellitus without complications: Secondary | ICD-10-CM | POA: Diagnosis not present

## 2020-01-13 DIAGNOSIS — R6881 Early satiety: Secondary | ICD-10-CM | POA: Diagnosis not present

## 2020-01-13 DIAGNOSIS — R110 Nausea: Secondary | ICD-10-CM | POA: Diagnosis not present

## 2020-01-13 DIAGNOSIS — R634 Abnormal weight loss: Secondary | ICD-10-CM | POA: Diagnosis not present

## 2020-01-13 NOTE — Telephone Encounter (Signed)
Pt's wife called in stating that they would like pt to stop taking the Jardiance. They think it could be contributing to his wt loss. They wanted to know if he could just stop the medication or do they need to decrease the medication over time.   Please advise they are aware Dr. Birdie Riddle is already gone for the day.

## 2020-01-14 NOTE — Telephone Encounter (Signed)
They can stop this medication (no weaning necessary) but his last A1C was 9.1% which indicates very poor sugar control.  This medication is used to control sugars so he needs to be very aware of a low carb/low sugar diet.  Uncontrolled diabetes can also cause weight loss.  He was referred to Endocrinology and I see that 1 appt was cancelled and the next isn't until January but he needs to be checking his sugars if he's not already

## 2020-01-14 NOTE — Telephone Encounter (Signed)
Please advise 

## 2020-01-15 ENCOUNTER — Telehealth: Payer: Self-pay

## 2020-01-15 ENCOUNTER — Telehealth: Payer: Self-pay | Admitting: Family Medicine

## 2020-01-15 DIAGNOSIS — K219 Gastro-esophageal reflux disease without esophagitis: Secondary | ICD-10-CM | POA: Diagnosis not present

## 2020-01-15 DIAGNOSIS — I480 Paroxysmal atrial fibrillation: Secondary | ICD-10-CM | POA: Diagnosis not present

## 2020-01-15 DIAGNOSIS — K5900 Constipation, unspecified: Secondary | ICD-10-CM | POA: Diagnosis not present

## 2020-01-15 DIAGNOSIS — E1165 Type 2 diabetes mellitus with hyperglycemia: Secondary | ICD-10-CM | POA: Diagnosis not present

## 2020-01-15 DIAGNOSIS — I5032 Chronic diastolic (congestive) heart failure: Secondary | ICD-10-CM | POA: Diagnosis not present

## 2020-01-15 DIAGNOSIS — R55 Syncope and collapse: Secondary | ICD-10-CM | POA: Diagnosis not present

## 2020-01-15 DIAGNOSIS — Z4800 Encounter for change or removal of nonsurgical wound dressing: Secondary | ICD-10-CM | POA: Diagnosis not present

## 2020-01-15 DIAGNOSIS — K611 Rectal abscess: Secondary | ICD-10-CM | POA: Diagnosis not present

## 2020-01-15 DIAGNOSIS — K5730 Diverticulosis of large intestine without perforation or abscess without bleeding: Secondary | ICD-10-CM | POA: Diagnosis not present

## 2020-01-15 DIAGNOSIS — I951 Orthostatic hypotension: Secondary | ICD-10-CM | POA: Diagnosis not present

## 2020-01-15 DIAGNOSIS — L4050 Arthropathic psoriasis, unspecified: Secondary | ICD-10-CM | POA: Diagnosis not present

## 2020-01-15 DIAGNOSIS — F0390 Unspecified dementia without behavioral disturbance: Secondary | ICD-10-CM | POA: Diagnosis not present

## 2020-01-15 DIAGNOSIS — I110 Hypertensive heart disease with heart failure: Secondary | ICD-10-CM | POA: Diagnosis not present

## 2020-01-15 NOTE — Telephone Encounter (Signed)
Can you call and make wife aware of this.

## 2020-01-15 NOTE — Telephone Encounter (Signed)
Spoke with wife regarding the [facility]. Per Dr. Birdie Riddle pt can stop the meds but must watch diet and intake of sugar. Pt wife voiced understanding.

## 2020-01-15 NOTE — Telephone Encounter (Signed)
[place] for verbal orders for PT  I agree that nursing should look at the area, but he may also need to reach out to his surgeon given his recent surgery and hospitalization.  We will see what the [facility] nurse says

## 2020-01-15 NOTE — Telephone Encounter (Signed)
Spoke with patient wife and patient voiced understanding. She stated that the appointment with Endo was cancelled due being in and out of rehab facilities. She also wants to know if you could she him regarding his evaluation of his diabetic meds because he cant get back in to Endocrinology until January. Please advise

## 2020-01-15 NOTE — Telephone Encounter (Signed)
Spoke with patient and he says [place] will be out tomorrow to look at area of concern. He will give [place] update on current situation.

## 2020-01-15 NOTE — Telephone Encounter (Signed)
He can schedule an appt for diabetes check.  In the meantime, he can stop the [place]

## 2020-01-15 NOTE — Telephone Encounter (Signed)
Pt came home from the nursing home with home health nursing and pt eval. They did the eval today and they would like to see pt for physical therapy for 1 wk 1, 2wk 2, and 1 wk 1.   He also wanted to let Dr. Birdie Riddle know that he is having some skin break down in the anal area, they are going to have nursing take a look at that.   Ok to [facility] on VM if no answer at [phone]

## 2020-01-18 ENCOUNTER — Telehealth: Payer: Self-pay | Admitting: Family Medicine

## 2020-01-18 DIAGNOSIS — Z4800 Encounter for change or removal of nonsurgical wound dressing: Secondary | ICD-10-CM | POA: Diagnosis not present

## 2020-01-18 DIAGNOSIS — F0390 Unspecified dementia without behavioral disturbance: Secondary | ICD-10-CM | POA: Diagnosis not present

## 2020-01-18 DIAGNOSIS — I480 Paroxysmal atrial fibrillation: Secondary | ICD-10-CM | POA: Diagnosis not present

## 2020-01-18 DIAGNOSIS — E1165 Type 2 diabetes mellitus with hyperglycemia: Secondary | ICD-10-CM | POA: Diagnosis not present

## 2020-01-18 DIAGNOSIS — K5730 Diverticulosis of large intestine without perforation or abscess without bleeding: Secondary | ICD-10-CM | POA: Diagnosis not present

## 2020-01-18 DIAGNOSIS — I5032 Chronic diastolic (congestive) heart failure: Secondary | ICD-10-CM | POA: Diagnosis not present

## 2020-01-18 DIAGNOSIS — L4050 Arthropathic psoriasis, unspecified: Secondary | ICD-10-CM | POA: Diagnosis not present

## 2020-01-18 DIAGNOSIS — K611 Rectal abscess: Secondary | ICD-10-CM | POA: Diagnosis not present

## 2020-01-18 DIAGNOSIS — I110 Hypertensive heart disease with heart failure: Secondary | ICD-10-CM | POA: Diagnosis not present

## 2020-01-18 NOTE — Telephone Encounter (Signed)
Pt schedule on 12/13 for Diabetes recheck. Pt already stopped Jardiance.

## 2020-01-18 NOTE — Telephone Encounter (Signed)
If he is not eating and not drinking, he needs to go back to the hospital for IV fluids.  A sip of water is clearly not enough to sustain him.  I recommend ER evaluation as we are not able to do fluids here in the office.

## 2020-01-18 NOTE — Telephone Encounter (Signed)
Reporting weight loss - 11/09 - 293 lbs - 12/23/2019 - 271 - 12/25/2019  269.6  12/28/2019  269  12/03  267. 4, 12/06  257.6 -   12/06  Has not eaten today and only a sip of water, up most of the night last night and slept most of the day today.  Minimal consumption of food in general.

## 2020-01-19 ENCOUNTER — Ambulatory Visit: Payer: Medicare PPO | Admitting: Family Medicine

## 2020-01-19 ENCOUNTER — Telehealth: Payer: Self-pay | Admitting: Family Medicine

## 2020-01-19 DIAGNOSIS — F0390 Unspecified dementia without behavioral disturbance: Secondary | ICD-10-CM | POA: Diagnosis not present

## 2020-01-19 DIAGNOSIS — E1165 Type 2 diabetes mellitus with hyperglycemia: Secondary | ICD-10-CM | POA: Diagnosis not present

## 2020-01-19 DIAGNOSIS — I110 Hypertensive heart disease with heart failure: Secondary | ICD-10-CM | POA: Diagnosis not present

## 2020-01-19 DIAGNOSIS — R531 Weakness: Secondary | ICD-10-CM | POA: Diagnosis not present

## 2020-01-19 DIAGNOSIS — Z4800 Encounter for change or removal of nonsurgical wound dressing: Secondary | ICD-10-CM | POA: Diagnosis not present

## 2020-01-19 DIAGNOSIS — K611 Rectal abscess: Secondary | ICD-10-CM | POA: Diagnosis not present

## 2020-01-19 DIAGNOSIS — K5730 Diverticulosis of large intestine without perforation or abscess without bleeding: Secondary | ICD-10-CM | POA: Diagnosis not present

## 2020-01-19 DIAGNOSIS — I480 Paroxysmal atrial fibrillation: Secondary | ICD-10-CM | POA: Diagnosis not present

## 2020-01-19 DIAGNOSIS — R110 Nausea: Secondary | ICD-10-CM | POA: Diagnosis not present

## 2020-01-19 DIAGNOSIS — I5032 Chronic diastolic (congestive) heart failure: Secondary | ICD-10-CM | POA: Diagnosis not present

## 2020-01-19 DIAGNOSIS — L4050 Arthropathic psoriasis, unspecified: Secondary | ICD-10-CM | POA: Diagnosis not present

## 2020-01-19 NOTE — Telephone Encounter (Signed)
Called patient this morning to check on him per Dr. Virgil Benedict recommendation. Per his wife, he is still not eating and barely drinking; he got up by himself today which is a change; spoke with Gary Butler and his voice sounded weak as well. I strongly encouraged him to go to ER for evaluation and maybe IV fluids, but he said he went thru this route 3 times now and he thinks all he needs is medication change. He said he will think about going to ER and call us back.

## 2020-01-19 NOTE — Telephone Encounter (Signed)
Please let patient and wife know that I have serious concerns about him staying home.  He's not eating, and worse he's not drinking.  This has to be evaluated and fluids likely need to be given.  This is a situation that could rapidly spiral and ER evaluation is necessary

## 2020-01-19 NOTE — Telephone Encounter (Signed)
Spoke with patient wife and wife stated that pt refused to go to the ER but needs something to check his sugar with because it is going up and down. I suggested that she call EMS to help her get him there and she stated that she will try.

## 2020-01-19 NOTE — Telephone Encounter (Signed)
Home health orders placed in Dr. Virgil Benedict bin up front

## 2020-01-19 NOTE — Telephone Encounter (Signed)
Patient called and said that he is not going to go to the ER - he doesn't feel well and he cancelled his appointment for today and said that he will reschedule later when he feels better.

## 2020-01-19 NOTE — Telephone Encounter (Signed)
Called and spoke with Gary Butler and reinforce the importance of him going to ER for Evaluation. Asked pt for him fluids intake for the day, he stated that he took a cup of hot chocolate this morning and couple of sips of water throughout the day. I told him that he is at high risk for dehydration and his situation can rapidly worsen. I asked if I should call EMS to go get him to ER, he said No, his wife will take him to ER this afternoon.

## 2020-01-20 ENCOUNTER — Telehealth: Payer: Self-pay | Admitting: Family Medicine

## 2020-01-20 NOTE — Telephone Encounter (Signed)
Called patient wife to give recommendations. She voiced understanding. She states she will try and get him to go.

## 2020-01-20 NOTE — Telephone Encounter (Signed)
Please advise 

## 2020-01-20 NOTE — Telephone Encounter (Signed)
Patient needs an A1C test.  He was seen in [place] yesterday  - He left without being seen by doctor.  Someone from [facility] called them to day and told them that he needs an A1C test.  Patient's wife wants to know if this is something that you can order for him since Dr. Birdie Riddle is out of the office.  Please advise.

## 2020-01-20 NOTE — Telephone Encounter (Signed)
If he is not eating or drinking he will become dehydrated and his kidneys will start to shut down. Needs to return to ER -- recommend [facility] ER due to much less wait time if this is a concern for him.  Will defer continuous glucometer to PCP if she feels appropriate when she views her messages later.

## 2020-01-20 NOTE — Telephone Encounter (Signed)
Patient's wife called and said that he went to the hospital and did not stay - still is not drinking or eating - he would like a glucose constant monitor.  Please advise.  Patient states that he will not go back to the hospital because they left him sitting too long.

## 2020-01-21 ENCOUNTER — Telehealth: Payer: Self-pay | Admitting: Family Medicine

## 2020-01-21 NOTE — Telephone Encounter (Signed)
I have placed orders from [facility] in the bin upfront with a charge sheet

## 2020-01-21 NOTE — Telephone Encounter (Signed)
Pt's wife called asking to speak with me, called her back at the provided number, no answer; left a message for her to call the office back to discuss her concerns.

## 2020-01-21 NOTE — Telephone Encounter (Signed)
Placed in provider's bin for approval.

## 2020-01-22 ENCOUNTER — Telehealth: Payer: Self-pay | Admitting: Family Medicine

## 2020-01-22 DIAGNOSIS — R110 Nausea: Secondary | ICD-10-CM | POA: Diagnosis not present

## 2020-01-22 DIAGNOSIS — I110 Hypertensive heart disease with heart failure: Secondary | ICD-10-CM | POA: Diagnosis not present

## 2020-01-22 DIAGNOSIS — E1165 Type 2 diabetes mellitus with hyperglycemia: Secondary | ICD-10-CM | POA: Diagnosis not present

## 2020-01-22 DIAGNOSIS — K611 Rectal abscess: Secondary | ICD-10-CM | POA: Diagnosis not present

## 2020-01-22 DIAGNOSIS — L4050 Arthropathic psoriasis, unspecified: Secondary | ICD-10-CM | POA: Diagnosis not present

## 2020-01-22 DIAGNOSIS — I480 Paroxysmal atrial fibrillation: Secondary | ICD-10-CM | POA: Diagnosis not present

## 2020-01-22 DIAGNOSIS — R52 Pain, unspecified: Secondary | ICD-10-CM | POA: Diagnosis not present

## 2020-01-22 DIAGNOSIS — I5032 Chronic diastolic (congestive) heart failure: Secondary | ICD-10-CM | POA: Diagnosis not present

## 2020-01-22 DIAGNOSIS — F0390 Unspecified dementia without behavioral disturbance: Secondary | ICD-10-CM | POA: Diagnosis not present

## 2020-01-22 DIAGNOSIS — Z4800 Encounter for change or removal of nonsurgical wound dressing: Secondary | ICD-10-CM | POA: Diagnosis not present

## 2020-01-22 DIAGNOSIS — K5730 Diverticulosis of large intestine without perforation or abscess without bleeding: Secondary | ICD-10-CM | POA: Diagnosis not present

## 2020-01-22 DIAGNOSIS — E876 Hypokalemia: Secondary | ICD-10-CM | POA: Diagnosis not present

## 2020-01-22 NOTE — Telephone Encounter (Signed)
Please advise 

## 2020-01-22 NOTE — Telephone Encounter (Signed)
[facility] Nurse called just to tell us that she has called 911 for Gary Butler - she states that she can not make him go, but that she is obligated to call 911 because she feels that in his current condition he needs to be seen at the hospital. -Patient is still not eating or drinking and seems to be getting worse.

## 2020-01-22 NOTE — Telephone Encounter (Signed)
Reviewed [facility] ER notes -- glucose at 121 (likely non-fasting but either way he is a diabetic so not unexpected). Last A1C was within 90 days. No need to repeat urgently at present.   Again if not eating or drinking -- ER evaluation. I know he has been taken by EMS but keeps leaving before evaluation. Unfortunately there are not a lot of things we can do if he is not following our recommendations. Can try to get him in early next week for in-office evaluation if PCP unavailable but if he is needing IV fluids as expected -- that is not something we can provide in office.

## 2020-01-22 NOTE — Telephone Encounter (Signed)
Will monitor chart to see if he presents to ER. Has been instructed multiple times in the past few days regarding need for ER assessment.

## 2020-01-22 NOTE — Telephone Encounter (Signed)
FYI: Patient currently at ED.

## 2020-01-25 ENCOUNTER — Encounter: Payer: Self-pay | Admitting: Family Medicine

## 2020-01-25 ENCOUNTER — Telehealth (INDEPENDENT_AMBULATORY_CARE_PROVIDER_SITE_OTHER): Payer: Medicare PPO | Admitting: Family Medicine

## 2020-01-25 ENCOUNTER — Ambulatory Visit: Payer: Medicare PPO | Admitting: Family Medicine

## 2020-01-25 DIAGNOSIS — R112 Nausea with vomiting, unspecified: Secondary | ICD-10-CM

## 2020-01-25 NOTE — Progress Notes (Signed)
I connected with  Gary Butler on 01/25/20 by a video enabled telemedicine application and verified that I am speaking with the correct person using two identifiers.   I discussed the limitations of evaluation and management by telemedicine. The patient expressed understanding and agreed to proceed.

## 2020-01-25 NOTE — Telephone Encounter (Signed)
Orders faxed to [facility] and placed in documents to go to scan.

## 2020-01-25 NOTE — Telephone Encounter (Signed)
Form completed and placed in basket  

## 2020-01-25 NOTE — Progress Notes (Signed)
Virtual Visit via Video   I connected with patient on 01/25/20 at  2:30 PM EST by a video enabled telemedicine application and verified that I am speaking with the correct person using two identifiers.  Location patient: Home Location provider: [facility] Office Persons participating in the virtual visit: Patient, Provider, [place] (Sabrina M)  I discussed the limitations of evaluation and management by telemedicine and the availability of in person appointments. The patient expressed understanding and agreed to proceed.  Interactive audio and video telecommunications were attempted between this provider and patient, however failed, due to patient having technical difficulties OR patient did not have access to video capability.  We continued and completed visit with audio only.   Subjective:   HPI:   Vomiting- wife reports he has been to the ER 4x since calling on 12/6 indicating that he is not eating or drinking.  He did not stay for assessment until last visit on 12/10.  He has not been on any meds for last 5-6 days.  'he can't eat anything'.  Wife reports for the last 3-4 days he has been almost 'constantly gagging'.  Wife reports a lot of the spit up is 'saliva' but at times it will be 'green' or brown'.  Not eating anything but watermelon.  Drinking water, Dr Malachi Bonds, OJ.  Doesn't vomit these substances.  Urination is 'normal'.  Saw Dr Earlean Shawl on 12/1 who thought his gagging was medication related.  This AM was unable to walk to the car.  ROS:   See pertinent positives and negatives per HPI.  Patient Active Problem List   Diagnosis Date Noted  . Chronic diastolic congestive heart failure ([place]) 11/29/2019  . Bilateral primary osteoarthritis of knee 11/23/2019  . Hypercoagulable state due to atrial fibrillation ([place]) 11/23/2019  . Physical deconditioning 11/23/2019  . Chronic gout of multiple sites 11/23/2019  . Hematochezia 11/22/2019  . Lower GI bleed 11/22/2019  .  Diverticulosis   . Acute urinary retention s/p Foley 11/10/2019 11/10/2019  . Suprasphincteric perirectal abscess s/p I&D 11/10/2019 11/10/2019  . Diarrhea 11/10/2019  . Perineal abscess 11/10/2019  . GERD without esophagitis 11/10/2019  . Atrial fibrillation ([place]) 11/10/2019  . Poorly controlled diabetes mellitus ([place]) 11/09/2019  . Dyspnea on exertion 02/19/2017  . Bilateral lower extremity edema 07/06/2015  . CHF (congestive heart failure) ([place]) 06/27/2015  . Fungal dermatitis 06/27/2015  . Edema 06/20/2015  . Increasing shortness of breath 06/20/2015  . Lumbar radiculopathy, acute 03/09/2015  . Essential hypertension 12/27/2014  . Uncontrolled type 2 diabetes mellitus with hyperglycemia, with long-term current use of insulin ([place]) 12/27/2014  . Mild aortic stenosis 06/28/2014  . Memory loss 04/29/2014  . Tinnitus of both ears 02/17/2014  . Wellness examination 12/04/2013  . Constipation due to slow transit 08/18/2013  . S/P left TKA 06/22/2013  . Meniere disease 04/21/2013  . Benign paroxysmal positional vertigo 04/21/2013  . History of methicillin resistant staphylococcus aureus (MRSA) 2015  . Psoriatic arthritis ([place]) 05/14/2012  . General medical exam 01/28/2012  . Cellulitis of eyelid 09/25/2011  . Diverticulitis 06/20/2011  . Erythrocytosis 05/18/2011  . S/P TKR (total knee replacement) 12/31/2010  . Obesity, Class III, BMI 40-49.9 (morbid obesity) ([place]) 12/31/2010  . Hyperlipidemia 12/22/2010  . BPH (benign prostatic hyperplasia) 12/22/2010  . Gout 12/22/2010  . OSA on CPAP 04/04/2009    Social History   Tobacco Use  . Smoking status: Never Smoker  . Smokeless tobacco: Never Used  Substance Use Topics  . Alcohol  use: Yes    Alcohol/week: 0.0 standard drinks    Comment: Once a month    Current Outpatient Medications:  .  omeprazole (PRILOSEC) 40 MG capsule, TAKE 1 CAPSULE BY MOUTH 30 TO 60 MINUTES BEFORE YOUR FIRST AND LAST MEAL OF THE DAY, Disp: 30 capsule,  Rfl: 0 .  Albuterol Sulfate (PROAIR RESPICLICK) 419 (90 Base) MCG/ACT AEPB, Inhale 2 puffs into the lungs every 6 (six) hours as needed (for wheezing or shortness of breath). (Patient not taking: Reported on 01/25/2020), Disp: 1 each, Rfl: 0 .  allopurinol (ZYLOPRIM) 300 MG tablet, TAKE 1 TABLET(300 MG) BY MOUTH DAILY (Patient not taking: Reported on 01/25/2020), Disp: 30 tablet, Rfl: 0 .  atorvastatin (LIPITOR) 40 MG tablet, TAKE 1 TABLET(40 MG) BY MOUTH EVERY EVENING (Patient not taking: Reported on 01/25/2020), Disp: 30 tablet, Rfl: 0 .  bisoprolol (ZEBETA) 5 MG tablet, Take 0.5 tablets (2.5 mg total) by mouth daily. (Patient not taking: Reported on 01/25/2020), Disp: 15 tablet, Rfl: 0 .  cetirizine (ZYRTEC) 10 MG tablet, TAKE 1 TABLET(10 MG) BY MOUTH DAILY (Patient not taking: Reported on 01/25/2020), Disp: 30 tablet, Rfl: 0 .  Cholecalciferol (VITAMIN D) 2000 UNITS tablet, Take 2,000 Units by mouth daily. (Patient not taking: Reported on 01/25/2020), Disp: , Rfl:  .  Cyanocobalamin (B-12) 2000 MCG TABS, Take 2,000 mcg by mouth daily.  (Patient not taking: Reported on 01/25/2020), Disp: , Rfl:  .  donepezil (ARICEPT) 10 MG tablet, TAKE 1 TABLET(10 MG) BY MOUTH AT BEDTIME (Patient not taking: Reported on 01/25/2020), Disp: 30 tablet, Rfl: 0 .  fluocinonide cream (LIDEX) 6.22 %, Apply 1 application topically 2 (two) times daily. To arms for psoriasis (Patient not taking: Reported on 01/25/2020), Disp: 60 g, Rfl: 0 .  fluticasone (FLONASE) 50 MCG/ACT nasal spray, Place 2 sprays into both nostrils daily. (Patient not taking: Reported on 01/25/2020), Disp: 16 g, Rfl: 0 .  folic acid (FOLVITE) 1 MG tablet, Take 1 mg by mouth daily.  (Patient not taking: Reported on 01/25/2020), Disp: , Rfl:  .  furosemide (LASIX) 20 MG tablet, TAKE 1 TABLET(20 MG) BY MOUTH DAILY (Patient not taking: Reported on 01/25/2020), Disp: 30 tablet, Rfl: 0 .  JARDIANCE 10 MG TABS tablet, TAKE 1 TABLET BY MOUTH DAILY BEFORE  BREAKFAST (Patient not taking: Reported on 01/25/2020), Disp: 30 tablet, Rfl: 0 .  meloxicam (MOBIC) 15 MG tablet, Take 1 tablet by mouth daily. (Patient not taking: Reported on 01/25/2020), Disp: 30 tablet, Rfl: 0 .  nystatin ointment (MYCOSTATIN), Apply 1 application topically daily. Apply to groin and perineal area (Patient not taking: Reported on 01/25/2020), Disp: 30 g, Rfl: 0 .  potassium chloride (MICRO-K) 10 MEQ CR capsule, TAKE 1 CAPSULE BY MOUTH EVERY DAY (Patient not taking: Reported on 01/25/2020), Disp: 30 capsule, Rfl: 0 .  psyllium (HYDROCIL/METAMUCIL) 95 % PACK, Take 1 packet by mouth 2 (two) times daily. (Patient not taking: Reported on 01/25/2020), Disp: 180 packet, Rfl: 0 .  tiZANidine (ZANAFLEX) 4 MG tablet, Take 0.5-1 tablets (2-4 mg total) by mouth every 8 (eight) hours as needed for muscle spasms. (Patient not taking: Reported on 01/25/2020), Disp: 20 tablet, Rfl: 0  Allergies  Allergen Reactions  . Penicillins Hives    Patient reports full body hives that required medical treatment when he was in his 70s or 20s. He tolerated cephalosporins.   [name] [Pregabalin] Swelling  . Levofloxacin Swelling    Objective:   There were no vitals taken for this visit.  Pt coughing and gagging in background.  Difficulty speaking in complete sentence w/o gagging.  Voice is weak.  Assessment and Plan:   Vomiting- ongoing issue for pt but not true vomiting.  More consistent w/ spasmodic gagging/cough.  Sxs seem more consistent w/ esophageal dysmotility.  Encouraged pt and wife to call GI and make them aware of ongoing situation.  Clearly not medication related as nothing has changed in the 6 days he has been off medication.  He has had an esophageal study scheduled but this was canceled and rescheduled b/c wife is now unable to get him out of the house and to his appts due to his severe deconditioning.  I told her that they will have to find a way to get him to these appointments b/c the  longer he is in bed, the more difficult this becomes.  Unfortunately, other than antiemetics (which don't seem to work) there is not much I can offer him via phone visit at this point.  I strongly encouraged increased oral intake as dehydration is a serious risk to him- particularly with his diabetes.  Discussed possible ambulance transport [facility] but pt is not in favor of this at this time.  Will follow.   Annye Asa, MD 01/25/2020  Time spent with the patient: 24 minutes, of which >50% was spent in obtaining information about symptoms, reviewing previous labs, evaluations, and treatments, counseling about condition (please see the discussed topics above), and developing a plan to further investigate it; had a number of questions which I addressed.

## 2020-01-26 ENCOUNTER — Telehealth: Payer: Self-pay | Admitting: Family Medicine

## 2020-01-26 DIAGNOSIS — Z4800 Encounter for change or removal of nonsurgical wound dressing: Secondary | ICD-10-CM | POA: Diagnosis not present

## 2020-01-26 DIAGNOSIS — K5730 Diverticulosis of large intestine without perforation or abscess without bleeding: Secondary | ICD-10-CM | POA: Diagnosis not present

## 2020-01-26 DIAGNOSIS — L4050 Arthropathic psoriasis, unspecified: Secondary | ICD-10-CM | POA: Diagnosis not present

## 2020-01-26 DIAGNOSIS — K611 Rectal abscess: Secondary | ICD-10-CM | POA: Diagnosis not present

## 2020-01-26 DIAGNOSIS — I5032 Chronic diastolic (congestive) heart failure: Secondary | ICD-10-CM | POA: Diagnosis not present

## 2020-01-26 DIAGNOSIS — E1165 Type 2 diabetes mellitus with hyperglycemia: Secondary | ICD-10-CM | POA: Diagnosis not present

## 2020-01-26 DIAGNOSIS — I110 Hypertensive heart disease with heart failure: Secondary | ICD-10-CM | POA: Diagnosis not present

## 2020-01-26 DIAGNOSIS — I480 Paroxysmal atrial fibrillation: Secondary | ICD-10-CM | POA: Diagnosis not present

## 2020-01-26 DIAGNOSIS — F0390 Unspecified dementia without behavioral disturbance: Secondary | ICD-10-CM | POA: Diagnosis not present

## 2020-01-26 NOTE — Telephone Encounter (Signed)
Patient needs a prescription for a manual wheel chair - Can be sent to [place].   Physical therapy will be put on hold until nausea issue is resolved.  Any questions, please advise.

## 2020-01-26 NOTE — Telephone Encounter (Signed)
Patient called stating that he needs a Rx for a manual wheelchair. Can be sent to [facility]. Physical Therapy will be put on hold until nausea subsides. Please advise

## 2020-01-27 ENCOUNTER — Telehealth: Payer: Self-pay | Admitting: Family Medicine

## 2020-01-27 ENCOUNTER — Other Ambulatory Visit: Payer: Self-pay

## 2020-01-27 DIAGNOSIS — I480 Paroxysmal atrial fibrillation: Secondary | ICD-10-CM | POA: Diagnosis not present

## 2020-01-27 DIAGNOSIS — Z4800 Encounter for change or removal of nonsurgical wound dressing: Secondary | ICD-10-CM | POA: Diagnosis not present

## 2020-01-27 DIAGNOSIS — I5032 Chronic diastolic (congestive) heart failure: Secondary | ICD-10-CM | POA: Diagnosis not present

## 2020-01-27 DIAGNOSIS — R5381 Other malaise: Secondary | ICD-10-CM

## 2020-01-27 DIAGNOSIS — L4050 Arthropathic psoriasis, unspecified: Secondary | ICD-10-CM | POA: Diagnosis not present

## 2020-01-27 DIAGNOSIS — I110 Hypertensive heart disease with heart failure: Secondary | ICD-10-CM | POA: Diagnosis not present

## 2020-01-27 DIAGNOSIS — K611 Rectal abscess: Secondary | ICD-10-CM | POA: Diagnosis not present

## 2020-01-27 DIAGNOSIS — F0390 Unspecified dementia without behavioral disturbance: Secondary | ICD-10-CM | POA: Diagnosis not present

## 2020-01-27 DIAGNOSIS — K922 Gastrointestinal hemorrhage, unspecified: Secondary | ICD-10-CM

## 2020-01-27 DIAGNOSIS — E1165 Type 2 diabetes mellitus with hyperglycemia: Secondary | ICD-10-CM | POA: Diagnosis not present

## 2020-01-27 DIAGNOSIS — K5730 Diverticulosis of large intestine without perforation or abscess without bleeding: Secondary | ICD-10-CM | POA: Diagnosis not present

## 2020-01-27 MED ORDER — PROMETHAZINE HCL 25 MG PO TABS: 25.0000 mg | ORAL_TABLET | Freq: Three times a day (TID) | ORAL | 0 refills | Status: AC | PRN

## 2020-01-27 NOTE — Progress Notes (Signed)
Home Medical Eqipment for bariactric wheelchair faxed to [facility].

## 2020-01-27 NOTE — Telephone Encounter (Signed)
Sent in a prescription for Promethazine that they can alternate w/ Ondansetron for nausea.  I sent a referral to [facility] ([facility]) so hopefully they will assess him soon.

## 2020-01-27 NOTE — Telephone Encounter (Signed)
Please order DME Bariatric Wheelchair and send to [facility].  Dx weakness, deconditioning, protein calorie malnutrition, gait abnormality

## 2020-01-27 NOTE — Telephone Encounter (Signed)
Home health nurse called the Zofran4mg . Every 8 hours -  that patient is taking nausea is not working.  He is still throwing up.  Please advise  Client and wife do agree and want to have a referral for pallitive care.

## 2020-01-27 NOTE — Telephone Encounter (Signed)
Spoke with Roselie Awkward and he stated that a Bariatric would be good for him.

## 2020-01-27 NOTE — Telephone Encounter (Signed)
Pt was seen in the ER and was given Zofran 4 mg and it is not helping with the nausea and vomiting. Their home health nurse suggested that he needs something stronger and perhaps alternate it along with the current med of Zofran 4 mg. Also Pt and wife are in agreeance that he needs pallitive care and a referral is needed. They prefer Hospice in [facility] because it is closer to their home. Please advise

## 2020-01-27 NOTE — Telephone Encounter (Signed)
We need to call and speak w/ PT to determine what type of chair he needs (lightweight or bariatric or other)

## 2020-01-28 ENCOUNTER — Telehealth: Payer: Self-pay | Admitting: Family Medicine

## 2020-01-28 ENCOUNTER — Encounter: Payer: Self-pay | Admitting: *Deleted

## 2020-01-28 ENCOUNTER — Telehealth: Payer: Self-pay

## 2020-01-28 NOTE — Telephone Encounter (Signed)
Manus Gunning with [facility] and palliative [facility] called in stating that pt was interested in the Palliative program. She wanted to know if Dr. Birdie Riddle thought this would be a good fit for this pt.  Please advise   Manus Gunning can be reached at [phone]

## 2020-01-28 NOTE — Progress Notes (Signed)
   Patient's wife has contacted [facility] requesting palliative care services for patient.  Patient is eligible for [facility].  This program would provide nurse and social work support through [facility].  Awaiting PCP approval.    [facility] [place] Referral Specialist [phone]

## 2020-01-28 NOTE — Telephone Encounter (Signed)
Notified pt that Dr. Birdie Riddle has placed a referral for palliative care. Pt understood. No further concerns at this time.

## 2020-01-28 NOTE — Telephone Encounter (Signed)
Spoke with wife about advice needed about Rx for nausea for husband [place] Rx sent in to pharmacy for promethezine and [employer].Dr. Birdie Riddle placed referral for palliative care and wheelchair form was faxed to [facility] for bariatric wheelchair. No further concerns at this time.

## 2020-01-28 NOTE — Telephone Encounter (Signed)
Order placed and form faxed to [facility] for bariatric wheelchair.

## 2020-01-28 NOTE — Telephone Encounter (Signed)
Spoke with wife about pt. Wife and pt understood. No further concerns at this time.

## 2020-01-30 ENCOUNTER — Other Ambulatory Visit: Payer: Self-pay

## 2020-01-30 ENCOUNTER — Inpatient Hospital Stay (HOSPITAL_COMMUNITY)
Admission: EM | Admit: 2020-01-30 | Discharge: 2020-02-15 | DRG: 853 | Disposition: A | Discharge status: Skilled Nursing Facility | Payer: Medicare PPO | Attending: Internal Medicine | Admitting: Internal Medicine

## 2020-01-30 ENCOUNTER — Emergency Department (HOSPITAL_COMMUNITY): Payer: Medicare PPO

## 2020-01-30 ENCOUNTER — Encounter (HOSPITAL_COMMUNITY): Payer: Self-pay | Admitting: Emergency Medicine

## 2020-01-30 DIAGNOSIS — R4701 Aphasia: Secondary | ICD-10-CM | POA: Diagnosis not present

## 2020-01-30 DIAGNOSIS — Z66 Do not resuscitate: Secondary | ICD-10-CM | POA: Diagnosis not present

## 2020-01-30 DIAGNOSIS — I499 Cardiac arrhythmia, unspecified: Secondary | ICD-10-CM | POA: Diagnosis not present

## 2020-01-30 DIAGNOSIS — Z20822 Contact with and (suspected) exposure to covid-19: Secondary | ICD-10-CM | POA: Diagnosis present

## 2020-01-30 DIAGNOSIS — R5381 Other malaise: Secondary | ICD-10-CM | POA: Diagnosis not present

## 2020-01-30 DIAGNOSIS — R0902 Hypoxemia: Secondary | ICD-10-CM

## 2020-01-30 DIAGNOSIS — I639 Cerebral infarction, unspecified: Secondary | ICD-10-CM | POA: Diagnosis not present

## 2020-01-30 DIAGNOSIS — Z85828 Personal history of other malignant neoplasm of skin: Secondary | ICD-10-CM

## 2020-01-30 DIAGNOSIS — Z515 Encounter for palliative care: Secondary | ICD-10-CM

## 2020-01-30 DIAGNOSIS — K6289 Other specified diseases of anus and rectum: Secondary | ICD-10-CM

## 2020-01-30 DIAGNOSIS — J9811 Atelectasis: Secondary | ICD-10-CM | POA: Diagnosis not present

## 2020-01-30 DIAGNOSIS — L4050 Arthropathic psoriasis, unspecified: Secondary | ICD-10-CM | POA: Diagnosis present

## 2020-01-30 DIAGNOSIS — I480 Paroxysmal atrial fibrillation: Secondary | ICD-10-CM | POA: Diagnosis not present

## 2020-01-30 DIAGNOSIS — D696 Thrombocytopenia, unspecified: Secondary | ICD-10-CM | POA: Diagnosis not present

## 2020-01-30 DIAGNOSIS — G8929 Other chronic pain: Secondary | ICD-10-CM | POA: Diagnosis present

## 2020-01-30 DIAGNOSIS — Z833 Family history of diabetes mellitus: Secondary | ICD-10-CM

## 2020-01-30 DIAGNOSIS — J189 Pneumonia, unspecified organism: Secondary | ICD-10-CM | POA: Diagnosis not present

## 2020-01-30 DIAGNOSIS — Z96653 Presence of artificial knee joint, bilateral: Secondary | ICD-10-CM | POA: Diagnosis present

## 2020-01-30 DIAGNOSIS — I959 Hypotension, unspecified: Secondary | ICD-10-CM | POA: Diagnosis not present

## 2020-01-30 DIAGNOSIS — R42 Dizziness and giddiness: Secondary | ICD-10-CM | POA: Diagnosis not present

## 2020-01-30 DIAGNOSIS — L89322 Pressure ulcer of left buttock, stage 2: Secondary | ICD-10-CM | POA: Diagnosis not present

## 2020-01-30 DIAGNOSIS — K82A1 Gangrene of gallbladder in cholecystitis: Secondary | ICD-10-CM | POA: Diagnosis present

## 2020-01-30 DIAGNOSIS — J90 Pleural effusion, not elsewhere classified: Secondary | ICD-10-CM | POA: Diagnosis not present

## 2020-01-30 DIAGNOSIS — I248 Other forms of acute ischemic heart disease: Secondary | ICD-10-CM | POA: Diagnosis present

## 2020-01-30 DIAGNOSIS — G9341 Metabolic encephalopathy: Secondary | ICD-10-CM | POA: Diagnosis not present

## 2020-01-30 DIAGNOSIS — E669 Obesity, unspecified: Secondary | ICD-10-CM | POA: Diagnosis not present

## 2020-01-30 DIAGNOSIS — R1310 Dysphagia, unspecified: Secondary | ICD-10-CM | POA: Diagnosis not present

## 2020-01-30 DIAGNOSIS — L8990 Pressure ulcer of unspecified site, unspecified stage: Secondary | ICD-10-CM | POA: Insufficient documentation

## 2020-01-30 DIAGNOSIS — I5032 Chronic diastolic (congestive) heart failure: Secondary | ICD-10-CM | POA: Diagnosis not present

## 2020-01-30 DIAGNOSIS — I110 Hypertensive heart disease with heart failure: Secondary | ICD-10-CM | POA: Diagnosis not present

## 2020-01-30 DIAGNOSIS — I4819 Other persistent atrial fibrillation: Secondary | ICD-10-CM | POA: Diagnosis present

## 2020-01-30 DIAGNOSIS — I5033 Acute on chronic diastolic (congestive) heart failure: Secondary | ICD-10-CM | POA: Diagnosis not present

## 2020-01-30 DIAGNOSIS — A419 Sepsis, unspecified organism: Principal | ICD-10-CM | POA: Diagnosis present

## 2020-01-30 DIAGNOSIS — R002 Palpitations: Secondary | ICD-10-CM | POA: Diagnosis present

## 2020-01-30 DIAGNOSIS — R29707 NIHSS score 7: Secondary | ICD-10-CM | POA: Diagnosis not present

## 2020-01-30 DIAGNOSIS — L02215 Cutaneous abscess of perineum: Secondary | ICD-10-CM | POA: Diagnosis present

## 2020-01-30 DIAGNOSIS — E785 Hyperlipidemia, unspecified: Secondary | ICD-10-CM | POA: Diagnosis present

## 2020-01-30 DIAGNOSIS — Z888 Allergy status to other drugs, medicaments and biological substances status: Secondary | ICD-10-CM

## 2020-01-30 DIAGNOSIS — R1111 Vomiting without nausea: Secondary | ICD-10-CM | POA: Diagnosis not present

## 2020-01-30 DIAGNOSIS — M1A9XX0 Chronic gout, unspecified, without tophus (tophi): Secondary | ICD-10-CM | POA: Diagnosis present

## 2020-01-30 DIAGNOSIS — I10 Essential (primary) hypertension: Secondary | ICD-10-CM | POA: Diagnosis present

## 2020-01-30 DIAGNOSIS — Z8261 Family history of arthritis: Secondary | ICD-10-CM

## 2020-01-30 DIAGNOSIS — E875 Hyperkalemia: Secondary | ICD-10-CM | POA: Diagnosis not present

## 2020-01-30 DIAGNOSIS — E876 Hypokalemia: Secondary | ICD-10-CM | POA: Diagnosis present

## 2020-01-30 DIAGNOSIS — I6340 Cerebral infarction due to embolism of unspecified cerebral artery: Secondary | ICD-10-CM | POA: Insufficient documentation

## 2020-01-30 DIAGNOSIS — M549 Dorsalgia, unspecified: Secondary | ICD-10-CM | POA: Diagnosis present

## 2020-01-30 DIAGNOSIS — I517 Cardiomegaly: Secondary | ICD-10-CM | POA: Diagnosis not present

## 2020-01-30 DIAGNOSIS — L89312 Pressure ulcer of right buttock, stage 2: Secondary | ICD-10-CM | POA: Diagnosis not present

## 2020-01-30 DIAGNOSIS — R0600 Dyspnea, unspecified: Secondary | ICD-10-CM | POA: Diagnosis not present

## 2020-01-30 DIAGNOSIS — Z6841 Body Mass Index (BMI) 40.0 and over, adult: Secondary | ICD-10-CM

## 2020-01-30 DIAGNOSIS — M2550 Pain in unspecified joint: Secondary | ICD-10-CM | POA: Diagnosis not present

## 2020-01-30 DIAGNOSIS — F0390 Unspecified dementia without behavioral disturbance: Secondary | ICD-10-CM | POA: Diagnosis present

## 2020-01-30 DIAGNOSIS — I4891 Unspecified atrial fibrillation: Secondary | ICD-10-CM | POA: Diagnosis not present

## 2020-01-30 DIAGNOSIS — K8000 Calculus of gallbladder with acute cholecystitis without obstruction: Secondary | ICD-10-CM | POA: Diagnosis present

## 2020-01-30 DIAGNOSIS — G4733 Obstructive sleep apnea (adult) (pediatric): Secondary | ICD-10-CM | POA: Diagnosis present

## 2020-01-30 DIAGNOSIS — E1142 Type 2 diabetes mellitus with diabetic polyneuropathy: Secondary | ICD-10-CM | POA: Diagnosis present

## 2020-01-30 DIAGNOSIS — Z791 Long term (current) use of non-steroidal anti-inflammatories (NSAID): Secondary | ICD-10-CM

## 2020-01-30 DIAGNOSIS — Z880 Allergy status to penicillin: Secondary | ICD-10-CM

## 2020-01-30 DIAGNOSIS — N281 Cyst of kidney, acquired: Secondary | ICD-10-CM | POA: Diagnosis not present

## 2020-01-30 DIAGNOSIS — Z4659 Encounter for fitting and adjustment of other gastrointestinal appliance and device: Secondary | ICD-10-CM

## 2020-01-30 DIAGNOSIS — M7989 Other specified soft tissue disorders: Secondary | ICD-10-CM | POA: Diagnosis not present

## 2020-01-30 DIAGNOSIS — K219 Gastro-esophageal reflux disease without esophagitis: Secondary | ICD-10-CM | POA: Diagnosis present

## 2020-01-30 DIAGNOSIS — R079 Chest pain, unspecified: Secondary | ICD-10-CM | POA: Diagnosis not present

## 2020-01-30 DIAGNOSIS — R29898 Other symptoms and signs involving the musculoskeletal system: Secondary | ICD-10-CM | POA: Diagnosis not present

## 2020-01-30 DIAGNOSIS — N400 Enlarged prostate without lower urinary tract symptoms: Secondary | ICD-10-CM | POA: Diagnosis present

## 2020-01-30 DIAGNOSIS — G319 Degenerative disease of nervous system, unspecified: Secondary | ICD-10-CM | POA: Diagnosis not present

## 2020-01-30 DIAGNOSIS — Z9189 Other specified personal risk factors, not elsewhere classified: Secondary | ICD-10-CM | POA: Diagnosis not present

## 2020-01-30 DIAGNOSIS — E1169 Type 2 diabetes mellitus with other specified complication: Secondary | ICD-10-CM | POA: Diagnosis not present

## 2020-01-30 DIAGNOSIS — R400 Somnolence: Secondary | ICD-10-CM | POA: Diagnosis not present

## 2020-01-30 DIAGNOSIS — R1314 Dysphagia, pharyngoesophageal phase: Secondary | ICD-10-CM | POA: Diagnosis not present

## 2020-01-30 DIAGNOSIS — E8809 Other disorders of plasma-protein metabolism, not elsewhere classified: Secondary | ICD-10-CM | POA: Diagnosis not present

## 2020-01-30 DIAGNOSIS — Z7189 Other specified counseling: Secondary | ICD-10-CM | POA: Diagnosis not present

## 2020-01-30 DIAGNOSIS — R1010 Upper abdominal pain, unspecified: Secondary | ICD-10-CM | POA: Diagnosis not present

## 2020-01-30 DIAGNOSIS — I63111 Cerebral infarction due to embolism of right vertebral artery: Secondary | ICD-10-CM | POA: Diagnosis not present

## 2020-01-30 DIAGNOSIS — R627 Adult failure to thrive: Secondary | ICD-10-CM | POA: Diagnosis not present

## 2020-01-30 DIAGNOSIS — I6782 Cerebral ischemia: Secondary | ICD-10-CM | POA: Diagnosis not present

## 2020-01-30 DIAGNOSIS — H8110 Benign paroxysmal vertigo, unspecified ear: Secondary | ICD-10-CM | POA: Diagnosis not present

## 2020-01-30 DIAGNOSIS — R630 Anorexia: Secondary | ICD-10-CM | POA: Diagnosis not present

## 2020-01-30 DIAGNOSIS — I6389 Other cerebral infarction: Secondary | ICD-10-CM | POA: Diagnosis not present

## 2020-01-30 DIAGNOSIS — E43 Unspecified severe protein-calorie malnutrition: Secondary | ICD-10-CM | POA: Diagnosis not present

## 2020-01-30 DIAGNOSIS — K8010 Calculus of gallbladder with chronic cholecystitis without obstruction: Secondary | ICD-10-CM | POA: Diagnosis not present

## 2020-01-30 DIAGNOSIS — M1990 Unspecified osteoarthritis, unspecified site: Secondary | ICD-10-CM | POA: Diagnosis present

## 2020-01-30 DIAGNOSIS — Z95828 Presence of other vascular implants and grafts: Secondary | ICD-10-CM

## 2020-01-30 DIAGNOSIS — Z9989 Dependence on other enabling machines and devices: Secondary | ICD-10-CM

## 2020-01-30 DIAGNOSIS — K8020 Calculus of gallbladder without cholecystitis without obstruction: Secondary | ICD-10-CM | POA: Diagnosis not present

## 2020-01-30 DIAGNOSIS — Z7401 Bed confinement status: Secondary | ICD-10-CM | POA: Diagnosis not present

## 2020-01-30 DIAGNOSIS — I5031 Acute diastolic (congestive) heart failure: Secondary | ICD-10-CM | POA: Diagnosis not present

## 2020-01-30 DIAGNOSIS — E1165 Type 2 diabetes mellitus with hyperglycemia: Secondary | ICD-10-CM

## 2020-01-30 DIAGNOSIS — Z79899 Other long term (current) drug therapy: Secondary | ICD-10-CM

## 2020-01-30 DIAGNOSIS — R0602 Shortness of breath: Secondary | ICD-10-CM

## 2020-01-30 DIAGNOSIS — R6521 Severe sepsis with septic shock: Secondary | ICD-10-CM | POA: Diagnosis not present

## 2020-01-30 DIAGNOSIS — I672 Cerebral atherosclerosis: Secondary | ICD-10-CM | POA: Diagnosis not present

## 2020-01-30 DIAGNOSIS — Z832 Family history of diseases of the blood and blood-forming organs and certain disorders involving the immune mechanism: Secondary | ICD-10-CM

## 2020-01-30 DIAGNOSIS — K810 Acute cholecystitis: Secondary | ICD-10-CM | POA: Diagnosis present

## 2020-01-30 DIAGNOSIS — I6310 Cerebral infarction due to embolism of unspecified precerebral artery: Secondary | ICD-10-CM | POA: Diagnosis not present

## 2020-01-30 DIAGNOSIS — Z881 Allergy status to other antibiotic agents status: Secondary | ICD-10-CM

## 2020-01-30 DIAGNOSIS — R112 Nausea with vomiting, unspecified: Secondary | ICD-10-CM | POA: Diagnosis not present

## 2020-01-30 DIAGNOSIS — Z4682 Encounter for fitting and adjustment of non-vascular catheter: Secondary | ICD-10-CM | POA: Diagnosis not present

## 2020-01-30 DIAGNOSIS — R000 Tachycardia, unspecified: Secondary | ICD-10-CM | POA: Diagnosis not present

## 2020-01-30 DIAGNOSIS — R29818 Other symptoms and signs involving the nervous system: Secondary | ICD-10-CM | POA: Diagnosis not present

## 2020-01-30 DIAGNOSIS — Z825 Family history of asthma and other chronic lower respiratory diseases: Secondary | ICD-10-CM

## 2020-01-30 DIAGNOSIS — Z8249 Family history of ischemic heart disease and other diseases of the circulatory system: Secondary | ICD-10-CM

## 2020-01-30 LAB — CBC
HCT: 47.5 % (ref 39.0–52.0)
Hemoglobin: 16 g/dL (ref 13.0–17.0)
MCH: 30.4 pg (ref 26.0–34.0)
MCHC: 33.7 g/dL (ref 30.0–36.0)
MCV: 90.1 fL (ref 80.0–100.0)
Platelets: 139 10*3/uL — ABNORMAL LOW (ref 150–400)
RBC: 5.27 MIL/uL (ref 4.22–5.81)
RDW: 15.9 % — ABNORMAL HIGH (ref 11.5–15.5)
WBC: 6.1 10*3/uL (ref 4.0–10.5)
nRBC: 0 % (ref 0.0–0.2)

## 2020-01-30 LAB — COMPREHENSIVE METABOLIC PANEL
ALT: 11 U/L (ref 0–44)
AST: 13 U/L — ABNORMAL LOW (ref 15–41)
Albumin: 2.6 g/dL — ABNORMAL LOW (ref 3.5–5.0)
Alkaline Phosphatase: 71 U/L (ref 38–126)
Anion gap: 19 — ABNORMAL HIGH (ref 5–15)
BUN: 13 mg/dL (ref 8–23)
CO2: 21 mmol/L — ABNORMAL LOW (ref 22–32)
Calcium: 9.7 mg/dL (ref 8.9–10.3)
Chloride: 97 mmol/L — ABNORMAL LOW (ref 98–111)
Creatinine, Ser: 1.19 mg/dL (ref 0.61–1.24)
GFR, Estimated: >60 mL/min (ref >60)
Glucose, Bld: 186 mg/dL — ABNORMAL HIGH (ref 70–99)
Potassium: 2.7 mmol/L — CL (ref 3.5–5.1)
Sodium: 137 mmol/L (ref 135–145)
Total Bilirubin: 3 mg/dL — ABNORMAL HIGH (ref 0.3–1.2)
Total Protein: 5.2 g/dL — ABNORMAL LOW (ref 6.5–8.1)

## 2020-01-30 LAB — MAGNESIUM: Magnesium: 1.5 mg/dL — ABNORMAL LOW (ref 1.7–2.4)

## 2020-01-30 LAB — CBG MONITORING, ED: Glucose-Capillary: 140 mg/dL — ABNORMAL HIGH (ref 70–99)

## 2020-01-30 LAB — PROCALCITONIN: Procalcitonin: <0.1 ng/mL

## 2020-01-30 LAB — PROTIME-INR
INR: 1.6 — ABNORMAL HIGH (ref 0.8–1.2)
Prothrombin Time: 18.8 seconds — ABNORMAL HIGH (ref 11.4–15.2)

## 2020-01-30 LAB — GLUCOSE, CAPILLARY: Glucose-Capillary: 164 mg/dL — ABNORMAL HIGH (ref 70–99)

## 2020-01-30 LAB — LACTIC ACID, PLASMA
Lactic Acid, Venous: 2.6 mmol/L (ref 0.5–1.9)
Lactic Acid, Venous: 3.3 mmol/L (ref 0.5–1.9)

## 2020-01-30 LAB — HEMOGLOBIN A1C
Hgb A1c MFr Bld: 6.5 % — ABNORMAL HIGH (ref 4.8–5.6)
Mean Plasma Glucose: 139.85 mg/dL

## 2020-01-30 LAB — TROPONIN I (HIGH SENSITIVITY)
Troponin I (High Sensitivity): 28 ng/L — ABNORMAL HIGH (ref <18)
Troponin I (High Sensitivity): 29 ng/L — ABNORMAL HIGH (ref <18)

## 2020-01-30 LAB — RESP PANEL BY RT-PCR (FLU A&B, COVID) ARPGX2
Influenza A by PCR: NEGATIVE
Influenza B by PCR: NEGATIVE
SARS Coronavirus 2 by RT PCR: NEGATIVE

## 2020-01-30 LAB — TSH: TSH: 1.121 u[IU]/mL (ref 0.350–4.500)

## 2020-01-30 LAB — LIPASE, BLOOD: Lipase: 48 U/L (ref 11–51)

## 2020-01-30 MED ORDER — PROMETHAZINE HCL 25 MG/ML IJ SOLN: 12.5000 mg | Freq: Four times a day (QID) | INTRAMUSCULAR | Status: DC | PRN

## 2020-01-30 MED ORDER — ACETAMINOPHEN 325 MG PO TABS
650.0000 mg | ORAL_TABLET | Freq: Four times a day (QID) | ORAL | Status: DC | PRN
  Administered 2020-01-31 – 2020-02-03 (×2): 650 mg via ORAL
  Filled 2020-01-30 (×2): qty 2

## 2020-01-30 MED ORDER — MORPHINE SULFATE (PF) 2 MG/ML IV SOLN
2.0000 mg | INTRAVENOUS | Status: DC | PRN
Start: 2020-01-30 — End: 2020-02-03
  Administered 2020-02-01 – 2020-02-03 (×4): 2 mg via INTRAVENOUS
  Filled 2020-01-30 (×4): qty 1

## 2020-01-30 MED ORDER — SODIUM CHLORIDE 0.9 % IV BOLUS
1000.0000 mL | Freq: Once | INTRAVENOUS | Status: AC
  Administered 2020-01-30: 1000 mL via INTRAVENOUS

## 2020-01-30 MED ORDER — ONDANSETRON HCL 4 MG PO TABS: 4.0000 mg | ORAL_TABLET | Freq: Four times a day (QID) | ORAL | Status: DC | PRN

## 2020-01-30 MED ORDER — POTASSIUM CHLORIDE IN NACL 40-0.9 MEQ/L-% IV SOLN
INTRAVENOUS | Status: DC
  Filled 2020-01-30 (×3): qty 1000

## 2020-01-30 MED ORDER — ONDANSETRON HCL 4 MG/2ML IJ SOLN
4.0000 mg | Freq: Four times a day (QID) | INTRAMUSCULAR | Status: DC | PRN
  Administered 2020-01-31 – 2020-02-09 (×5): 4 mg via INTRAVENOUS
  Filled 2020-01-30 (×5): qty 2

## 2020-01-30 MED ORDER — ACETAMINOPHEN 650 MG RE SUPP: 650.0000 mg | Freq: Four times a day (QID) | RECTAL | Status: DC | PRN

## 2020-01-30 MED ORDER — METRONIDAZOLE IN NACL 5-0.79 MG/ML-% IV SOLN
500.0000 mg | Freq: Once | INTRAVENOUS | Status: AC
  Administered 2020-01-30: 500 mg via INTRAVENOUS
  Filled 2020-01-30: qty 100

## 2020-01-30 MED ORDER — INSULIN ASPART 100 UNIT/ML ~~LOC~~ SOLN: 0.0000 [IU] | Freq: Every day | SUBCUTANEOUS | Status: DC

## 2020-01-30 MED ORDER — SODIUM CHLORIDE 0.9 % IV SOLN
2.0000 g | Freq: Once | INTRAVENOUS | Status: AC
  Administered 2020-01-30: 2 g via INTRAVENOUS
  Filled 2020-01-30: qty 2

## 2020-01-30 MED ORDER — DILTIAZEM LOAD VIA INFUSION
20.0000 mg | Freq: Once | INTRAVENOUS | Status: AC
  Administered 2020-01-30: 20 mg via INTRAVENOUS
  Filled 2020-01-30: qty 20

## 2020-01-30 MED ORDER — HEPARIN BOLUS VIA INFUSION
6000.0000 [IU] | Freq: Once | INTRAVENOUS | Status: AC
  Administered 2020-01-30: 6000 [IU] via INTRAVENOUS
  Filled 2020-01-30: qty 6000

## 2020-01-30 MED ORDER — HEPARIN (PORCINE) 25000 UT/250ML-% IV SOLN
1250.0000 [IU]/h | INTRAVENOUS | Status: DC
  Administered 2020-01-30: 1500 [IU]/h via INTRAVENOUS
  Filled 2020-01-30 (×2): qty 250

## 2020-01-30 MED ORDER — DILTIAZEM HCL-DEXTROSE 125-5 MG/125ML-% IV SOLN (PREMIX)
5.0000 mg/h | INTRAVENOUS | Status: AC
  Administered 2020-01-30 – 2020-02-02 (×2): 5 mg/h via INTRAVENOUS
  Administered 2020-02-02 – 2020-02-03 (×2): 15 mg/h via INTRAVENOUS
  Administered 2020-02-03: 5 mg/h via INTRAVENOUS
  Filled 2020-01-30 (×8): qty 125

## 2020-01-30 MED ORDER — INSULIN ASPART 100 UNIT/ML ~~LOC~~ SOLN
0.0000 [IU] | Freq: Three times a day (TID) | SUBCUTANEOUS | Status: DC
  Administered 2020-01-30 – 2020-01-31 (×2): 2 [IU] via SUBCUTANEOUS
  Administered 2020-01-31: 3 [IU] via SUBCUTANEOUS
  Administered 2020-01-31 – 2020-02-03 (×3): 2 [IU] via SUBCUTANEOUS
  Administered 2020-02-03 (×2): 3 [IU] via SUBCUTANEOUS
  Administered 2020-02-04 – 2020-02-05 (×6): 2 [IU] via SUBCUTANEOUS
  Administered 2020-02-06: 3 [IU] via SUBCUTANEOUS
  Administered 2020-02-06 – 2020-02-08 (×6): 2 [IU] via SUBCUTANEOUS
  Administered 2020-02-08: 3 [IU] via SUBCUTANEOUS
  Administered 2020-02-08: 2 [IU] via SUBCUTANEOUS
  Administered 2020-02-09 (×3): 3 [IU] via SUBCUTANEOUS
  Administered 2020-02-10 (×3): 2 [IU] via SUBCUTANEOUS

## 2020-01-30 MED ORDER — SODIUM CHLORIDE 0.9 % IV SOLN
INTRAVENOUS | Status: DC
  Administered 2020-01-30: 125 mL/h via INTRAVENOUS

## 2020-01-30 MED ORDER — SODIUM CHLORIDE 0.9% FLUSH
3.0000 mL | Freq: Two times a day (BID) | INTRAVENOUS | Status: DC
  Administered 2020-02-02 – 2020-02-11 (×13): 3 mL via INTRAVENOUS

## 2020-01-30 MED ORDER — MAGNESIUM SULFATE 2 GM/50ML IV SOLN
2.0000 g | Freq: Once | INTRAVENOUS | Status: AC
  Administered 2020-01-30: 2 g via INTRAVENOUS
  Filled 2020-01-30: qty 50

## 2020-01-30 MED ORDER — POTASSIUM CHLORIDE 10 MEQ/100ML IV SOLN
10.0000 meq | Freq: Once | INTRAVENOUS | Status: AC
  Administered 2020-01-30: 10 meq via INTRAVENOUS
  Filled 2020-01-30: qty 100

## 2020-01-30 NOTE — ED Notes (Signed)
Multiple attempts made to give report, unit is not responding the phone.

## 2020-01-30 NOTE — ED Triage Notes (Signed)
Pt brought to ED by EMS from home for complain of not been able to eat or drink for the past week, no taking his medication for the past 5 days. Pt on Afib RVR with a HR of 140-17, first SBP by EMS 68, Up to 110 with 250 NS given by EMS pta, SPO 2 99% RA.

## 2020-01-30 NOTE — ED Provider Notes (Signed)
[facility] EMERGENCY DEPARTMENT Provider Note   CSN: [id number] Arrival date & time: 01/30/20  1203     History Chief Complaint  Patient presents with  . Palpitations    Gary Butler is a 74 y.o. male.  Pt presents to the ED today with nausea and not being able to eat or drink.  This issue has been going on for several months.  He was admitted to [facility] from 11/15-19 for the same.  He was sent from there to a SNF.  He has been home for a few weeks and is not getting any better.  He has seen GI for his sx.  GI thought sx were due to medications.  PCP thinks pt has an esophageal dysmotility problem.  He has had esophageal studies ordered, but his wife was unable to get him to the appt.  Antiemetics have not been helping.  Pt has not had any meds in several days.  Pt said he can only drink ice water.  No food.  Pt's sbp when EMS arrived was 68.  They gave him 250 cc NS.  Pt is also in afib with rvr.  Per chart, he does not have a hx of this and is not on blood thinners.          Past Medical History:  Diagnosis Date  . Arthritis   . CHF (congestive heart failure) ([place])   . Constipation   . Diabetes mellitus   . Heart murmur   . History of gout   . History of skin cancer   . Hypertension   . Morbid (severe) obesity due to excess calories ([place])   . OSA (obstructive sleep apnea)   . Psoriatic arthritis ([place])   . Rectal fissure   . Tinnitus     Patient Active Problem List   Diagnosis Date Noted  . Chronic diastolic congestive heart failure ([place]) 11/29/2019  . Bilateral primary osteoarthritis of knee 11/23/2019  . Hypercoagulable state due to atrial fibrillation ([place]) 11/23/2019  . Physical deconditioning 11/23/2019  . Chronic gout of multiple sites 11/23/2019  . Hematochezia 11/22/2019  . Lower GI bleed 11/22/2019  . Diverticulosis   . Acute urinary retention s/p Foley 11/10/2019 11/10/2019  . Suprasphincteric perirectal abscess s/p I&D  11/10/2019 11/10/2019  . Diarrhea 11/10/2019  . Perineal abscess 11/10/2019  . GERD without esophagitis 11/10/2019  . Atrial fibrillation ([place]) 11/10/2019  . Poorly controlled diabetes mellitus ([place]) 11/09/2019  . Dyspnea on exertion 02/19/2017  . Bilateral lower extremity edema 07/06/2015  . CHF (congestive heart failure) ([place]) 06/27/2015  . Fungal dermatitis 06/27/2015  . Edema 06/20/2015  . Increasing shortness of breath 06/20/2015  . Lumbar radiculopathy, acute 03/09/2015  . Essential hypertension 12/27/2014  . Uncontrolled type 2 diabetes mellitus with hyperglycemia, with long-term current use of insulin ([place]) 12/27/2014  . Mild aortic stenosis 06/28/2014  . Memory loss 04/29/2014  . Tinnitus of both ears 02/17/2014  . Wellness examination 12/04/2013  . Constipation due to slow transit 08/18/2013  . S/P left TKA 06/22/2013  . Meniere disease 04/21/2013  . Benign paroxysmal positional vertigo 04/21/2013  . History of methicillin resistant staphylococcus aureus (MRSA) 2015  . Psoriatic arthritis ([place]) 05/14/2012  . General medical exam 01/28/2012  . Cellulitis of eyelid 09/25/2011  . Diverticulitis 06/20/2011  . Erythrocytosis 05/18/2011  . S/P TKR (total knee replacement) 12/31/2010  . Obesity, Class III, BMI 40-49.9 (morbid obesity) ([place]) 12/31/2010  . Hyperlipidemia 12/22/2010  . BPH (benign  prostatic hyperplasia) 12/22/2010  . Gout 12/22/2010  . OSA on CPAP 04/04/2009    Past Surgical History:  Procedure Laterality Date  . addenoids    . ANAL FISTULOTOMY  2019   Dr Drue Flirt, [facility]  . IRRIGATION AND DEBRIDEMENT ABSCESS N/A 11/10/2019   Procedure: INCISION AND DRAINAGE OF HIGH SCROTAL ABSCESS;  Surgeon: Michael Boston, MD;  Location: [facility];  Service: General;  Laterality: N/A;  . JOINT REPLACEMENT  2012   RT KNEE  . LUNG BIOPSY     20 YRS AGO  . RECTAL EXAM UNDER ANESTHESIA N/A 11/10/2019   Procedure: ANORECTAL EXAM UNDER ANESTHESIA;  Surgeon: Michael Boston, MD;   Location: [facility];  Service: General;  Laterality: N/A;  . TONSILLECTOMY    . TOTAL KNEE ARTHROPLASTY Bilateral 06/22/2013   Procedure: LEFT TOTAL KNEE ARTHROPLASTY;  MEDIAL SOFT TISSUE EXPLORATION SAPHENOUS NEURECTOMY RIGHT KNEE;  Surgeon: Mauri Pole, MD;  Location: [facility];  Service: Orthopedics;  Laterality: Bilateral;       Family History  Problem Relation Age of Onset  . Emphysema Father   . Clotting disorder Father   . Arthritis Father   . Heart disease Mother   . Arthritis Mother   . Arthritis Maternal Grandmother   . Hypertension Maternal Grandmother   . Diabetes Maternal Grandmother   . Arthritis Paternal Grandmother   . Diabetes Maternal Aunt   . Diabetes Paternal Aunt     Social History   Tobacco Use  . Smoking status: Never Smoker  . Smokeless tobacco: Never Used  Vaping Use  . Vaping Use: Never used  Substance Use Topics  . Alcohol use: Yes    Alcohol/week: 0.0 standard drinks    Comment: Once a month  . Drug use: No    Home Medications Prior to Admission medications   Medication Sig Start Date End Date Taking? Authorizing Provider  Albuterol Sulfate (PROAIR RESPICLICK) 751 (90 Base) MCG/ACT AEPB Inhale 2 puffs into the lungs every 6 (six) hours as needed (for wheezing or shortness of breath). Patient not taking: Reported on 01/25/2020 12/10/19   Ngetich, Dinah C, NP  allopurinol (ZYLOPRIM) 300 MG tablet TAKE 1 TABLET(300 MG) BY MOUTH DAILY Patient not taking: Reported on 01/25/2020 12/10/19   Ngetich, Dinah C, NP  atorvastatin (LIPITOR) 40 MG tablet TAKE 1 TABLET(40 MG) BY MOUTH EVERY EVENING Patient not taking: Reported on 01/25/2020 12/10/19   Ngetich, Dinah C, NP  bisoprolol (ZEBETA) 5 MG tablet Take 0.5 tablets (2.5 mg total) by mouth daily. Patient not taking: Reported on 01/25/2020 12/10/19 02/08/20  Ngetich, Dinah C, NP  cetirizine (ZYRTEC) 10 MG tablet TAKE 1 TABLET(10 MG) BY MOUTH DAILY Patient not taking: Reported on 01/25/2020 02/09/19    [name], MD  Cholecalciferol (VITAMIN D) 2000 UNITS tablet Take 2,000 Units by mouth daily. Patient not taking: Reported on 01/25/2020    [provider]  Cyanocobalamin (B-12) 2000 MCG TABS Take 2,000 mcg by mouth daily.  Patient not taking: Reported on 01/25/2020    [provider]  donepezil (ARICEPT) 10 MG tablet TAKE 1 TABLET(10 MG) BY MOUTH AT BEDTIME Patient not taking: Reported on 01/25/2020 12/10/19   Ngetich, Dinah C, NP  fluocinonide cream (LIDEX) 0.25 % Apply 1 application topically 2 (two) times daily. To arms for psoriasis Patient not taking: Reported on 01/25/2020 12/10/19   Ngetich, Dinah C, NP  fluticasone (FLONASE) 50 MCG/ACT nasal spray Place 2 sprays into both nostrils daily. Patient not taking: Reported on 01/25/2020  12/10/19   Ngetich, Dinah C, NP  folic acid (FOLVITE) 1 MG tablet Take 1 mg by mouth daily.  Patient not taking: Reported on 01/25/2020 04/29/12   [provider]  furosemide (LASIX) 20 MG tablet TAKE 1 TABLET(20 MG) BY MOUTH DAILY Patient not taking: Reported on 01/25/2020 12/10/19   Ngetich, Dinah C, NP  JARDIANCE 10 MG TABS tablet TAKE 1 TABLET BY MOUTH DAILY BEFORE BREAKFAST Patient not taking: Reported on 01/25/2020 12/15/19   [name], MD  meloxicam (MOBIC) 15 MG tablet Take 1 tablet by mouth daily. Patient not taking: Reported on 01/25/2020 12/10/19   Ngetich, Dinah C, NP  nystatin ointment (MYCOSTATIN) Apply 1 application topically daily. Apply to groin and perineal area Patient not taking: Reported on 01/25/2020 12/10/19   Ngetich, Dinah C, NP  omeprazole (PRILOSEC) 40 MG capsule TAKE 1 CAPSULE BY MOUTH 30 TO 60 MINUTES BEFORE YOUR FIRST AND LAST MEAL OF THE DAY 12/10/19   Ngetich, Dinah C, NP  potassium chloride (MICRO-K) 10 MEQ CR capsule TAKE 1 CAPSULE BY MOUTH EVERY DAY Patient not taking: Reported on 01/25/2020 12/10/19   Ngetich, Dinah C, NP  promethazine (PHENERGAN) 25 MG tablet Take 1 tablet (25  mg total) by mouth every 8 (eight) hours as needed for nausea or vomiting. Alternate w/ Ondansetron (Zofran) 01/27/20   [name], MD  psyllium (HYDROCIL/METAMUCIL) 95 % PACK Take 1 packet by mouth 2 (two) times daily. Patient not taking: Reported on 01/25/2020 11/15/19 02/13/20  Kayleen Memos, DO  tiZANidine (ZANAFLEX) 4 MG tablet Take 0.5-1 tablets (2-4 mg total) by mouth every 8 (eight) hours as needed for muscle spasms. Patient not taking: Reported on 01/25/2020 12/28/19   Brunetta Jeans, PA-C    Allergies    Penicillins, Lyrica [pregabalin], and Levofloxacin  Review of Systems   Review of Systems  Gastrointestinal: Positive for nausea.  Neurological: Positive for weakness.  All other systems reviewed and are negative.   Physical Exam Updated Vital Signs BP 99/85   Pulse (!) 135   Temp 99.4 F (37.4 C) (Rectal)   Resp 18   Ht 5\' 11"  (1.803 m)   Wt 131.5 kg   SpO2 100%   BMI 40.43 kg/m   Physical Exam Vitals and nursing note reviewed.  Constitutional:      General: He is in acute distress.     Appearance: He is obese.  HENT:     Head: Normocephalic and atraumatic.     Right Ear: External ear normal.     Left Ear: External ear normal.     Nose: Nose normal.     Mouth/Throat:     Mouth: Mucous membranes are dry.  Eyes:     Extraocular Movements: Extraocular movements intact.     Conjunctiva/sclera: Conjunctivae normal.     Pupils: Pupils are equal, round, and reactive to light.  Cardiovascular:     Rate and Rhythm: Tachycardia present. Rhythm irregular.     Pulses: Normal pulses.     Heart sounds: Normal heart sounds.  Pulmonary:     Effort: Pulmonary effort is normal.     Breath sounds: Normal breath sounds.  Abdominal:     General: Abdomen is flat. Bowel sounds are normal.     Palpations: Abdomen is soft.  Musculoskeletal:     Cervical back: Normal range of motion and neck supple.     Right lower leg: Edema present.     Left lower leg: Edema  present.  Skin:    General: Skin is warm.     Capillary Refill: Capillary refill takes less than 2 seconds.  Neurological:     General: No focal deficit present.     Mental Status: He is alert and oriented to person, place, and time.  Psychiatric:        Mood and Affect: Mood normal.        Behavior: Behavior normal.     ED Results / Procedures / Treatments   Labs (all labs ordered are listed, but only abnormal results are displayed) Labs Reviewed  MAGNESIUM - Abnormal; Notable for the following components:      Result Value   Magnesium 1.5 (*)    All other components within normal limits  CBC - Abnormal; Notable for the following components:   RDW 15.9 (*)    Platelets 139 (*)    All other components within normal limits  COMPREHENSIVE METABOLIC PANEL - Abnormal; Notable for the following components:   Potassium 2.7 (*)    Chloride 97 (*)    CO2 21 (*)    Glucose, Bld 186 (*)    Total Protein 5.2 (*)    Albumin 2.6 (*)    AST 13 (*)    Total Bilirubin 3.0 (*)    Anion gap 19 (*)    All other components within normal limits  PROTIME-INR - Abnormal; Notable for the following components:   Prothrombin Time 18.8 (*)    INR 1.6 (*)    All other components within normal limits  LACTIC ACID, PLASMA - Abnormal; Notable for the following components:   Lactic Acid, Venous 2.6 (*)    All other components within normal limits  TROPONIN I (HIGH SENSITIVITY) - Abnormal; Notable for the following components:   Troponin I (High Sensitivity) 28 (*)    All other components within normal limits  RESP PANEL BY RT-PCR (FLU A&B, COVID) ARPGX2  CULTURE, BLOOD (ROUTINE X 2)  CULTURE, BLOOD (ROUTINE X 2)  TSH  LIPASE, BLOOD  URINALYSIS, ROUTINE W REFLEX MICROSCOPIC  LACTIC ACID, PLASMA  TROPONIN I (HIGH SENSITIVITY)    EKG EKG Interpretation  Date/Time:  Saturday January 30 2020 12:05:36 EST Ventricular Rate:  154 PR Interval:    QRS Duration: 88 QT Interval:  299 QTC  Calculation: 479 R Axis:   -26 Text Interpretation: Atrial fibrillation with rapid V-rate Borderline left axis deviation Repolarization abnormality, prob rate related afib is new Confirmed by Isla Pence [phone]) on 01/30/2020 1:12:48 PM   Radiology CT ABDOMEN PELVIS WO CONTRAST  Result Date: 01/30/2020 CLINICAL DATA:  74 year old with lack of appetite.  Abdominal pain. EXAM: CT ABDOMEN AND PELVIS WITHOUT CONTRAST TECHNIQUE: Multidetector CT imaging of the abdomen and pelvis was performed following the standard protocol without IV contrast. COMPARISON:  11/22/2019 FINDINGS: Lower chest: Dependent densities at the lung bases. No significant pleural effusions. Small amount of pericardial fluid along the anterior aspect of the heart. Hepatobiliary: Evidence for gallstones with an air-fluid level in the gallbladder. No significant gallbladder distension. There may be minimal pericholecystic stranding. Normal appearance of the liver. No significant biliary dilatation. Pancreas: Unremarkable. No pancreatic ductal dilatation or surrounding inflammatory changes. Spleen: Normal in size without focal abnormality. Adrenals/Urinary Tract: Normal appearance of the adrenal glands. Bilateral renal cysts. Large cyst in the right kidney upper pole that measures up to 7.5 cm. Negative for hydronephrosis. Urinary bladder is markedly distended. No evidence for urinary stones. Stomach/Bowel: Diffuse wall thickening involving the rectum. There is perirectal  edema and a small amount of presacral fluid or edema. Normal appearance of the stomach. No evidence for bowel obstruction. Questionable wall thickening involving the right colon on sequence 3 image 42. Vascular/Lymphatic: Atherosclerotic calcifications involving the abdominal aorta without aneurysm. No lymph node enlargement in the abdomen or pelvis. Reproductive: Prostate is unremarkable. Other: Fat containing inguinal hernias, left side greater than right. Perirectal  stranding with some presacral edema. No significant ascites. Negative for free intraperitoneal air. Musculoskeletal: No acute bone abnormality. IMPRESSION: 1. Cholelithiasis with new air-fluid level in the gallbladder. Evidence for mild pericholecystic stranding. 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 Recommend surgical consultation. 2. Diffuse wall thickening involving the rectum with perirectal edema. Findings are suggestive for inflammation and proctitis. Underlying neoplastic process cannot be excluded and recommend GI consultation. Questionable wall thickening involving the right colon. 3. Distention of the urinary bladder without hydronephrosis. 4. Bilateral renal cysts. These results were called by telephone at the time of interpretation on 01/30/2020 at 2:25 pm to provider [facility] , who verbally acknowledged these results. Electronically Signed   By: Markus Daft M.D.   On: 01/30/2020 14:30   DG Chest Port 1 View  Result Date: 01/30/2020 CLINICAL DATA:  Dyspnea EXAM: PORTABLE CHEST 1 VIEW COMPARISON:  10/01/2018 chest radiograph. FINDINGS: Stable cardiomediastinal silhouette with mild cardiomegaly. No pneumothorax. Chronic mild bilateral pleural thickening without pleural effusions. No pulmonary edema. No acute consolidative airspace disease. Low lung volumes. IMPRESSION: 1. Stable mild cardiomegaly without pulmonary edema. 2. Low lung volumes with no active pulmonary disease. Electronically Signed   By: Ilona Sorrel M.D.   On: 01/30/2020 13:37    Procedures Procedures (including critical care time)  Medications Ordered in ED Medications  sodium chloride 0.9 % bolus 1,000 mL (1,000 mLs Intravenous New Bag/Given 01/30/20 1250)    And  0.9 %  sodium chloride infusion ( Intravenous Infusion Verify 01/30/20 1335)  ceFEPIme (MAXIPIME) 2 g in sodium chloride 0.9 % 100 mL IVPB (has no administration in time range)    And  metroNIDAZOLE (FLAGYL) IVPB  500 mg (has no administration in time range)  magnesium sulfate IVPB 2 g 50 mL ( Intravenous Infusion Verify 01/30/20 1352)  potassium chloride 10 mEq in 100 mL IVPB (10 mEq Intravenous New Bag/Given 01/30/20 1421)  sodium chloride 0.9 % bolus 1,000 mL (1,000 mLs Intravenous New Bag/Given 01/30/20 1423)    ED Course  I have reviewed the triage vital signs and the nursing notes.  Pertinent labs & imaging results that were available during my care of the patient were reviewed by me and considered in my medical decision making (see chart for details).    MDM Rules/Calculators/A&P                          Pt is in afib with rvr.  He can't tell he is having a rapid heart rate, so it is unclear how long it's been going on.  I am going to hold on heparin in case he needs to go to the OR.  CHA2DS2/VAS Stroke Risk Points  Current as of 8 minutes ago     5 >= 2 Points: High Risk  1 - 1.99 Points: Medium Risk  0 Points: Low Risk    Last Change:       Details    This score determines the patient's risk of having a stroke if the  patient has atrial fibrillation.  Points Metrics  1 Has Congestive Heart Failure:  Yes    Current as of 8 minutes ago  0 Has Vascular Disease:  No    Current as of 8 minutes ago  1 Has Hypertension:  Yes    Current as of 8 minutes ago  2 Age:  14    Current as of 8 minutes ago  1 Has Diabetes:  Yes    Current as of 8 minutes ago  0 Had Stroke:  No  Had TIA:  No  Had Thromboembolism:  No    Current as of 8 minutes ago  0 Male:  No    Current as of 8 minutes ago     K and Mg are low.  They are replaced.  BP is getting better and HR is coming down with IVFs.  Lactic is elevated.  Pt given additional IVFs.  CT scan reveals emphysematous cholecystitis.  He was given maxipime and flagyl.  He was d/w Dr. Kieth Brightly who will see in consult.  Pt also has some rectal wall thickening and will need a colonoscopy.  Pt d/w Dr. Lorin Mercy for admission.   CRITICAL  CARE Performed by: Isla Pence   Total critical care time: 30 minutes  Critical care time was exclusive of separately billable procedures and treating other patients.  Critical care was necessary to treat or prevent imminent or life-threatening deterioration.  Critical care was time spent personally by me on the following activities: development of treatment plan with patient and/or surrogate as well as nursing, discussions with consultants, evaluation of patient's response to treatment, examination of patient, obtaining history from patient or surrogate, ordering and performing treatments and interventions, ordering and review of laboratory studies, ordering and review of radiographic studies, pulse oximetry and re-evaluation of patient's condition.     Final Clinical Impression(s) / ED Diagnoses Final diagnoses:  Atrial fibrillation with RVR ([place])  Hypokalemia  Hypomagnesemia  Cholecystitis with gangrene of gallbladder  Rectal inflammation    Rx / DC Orders ED Discharge Orders    None       Isla Pence, MD 01/30/20 1530

## 2020-01-30 NOTE — ED Notes (Signed)
Date and time results received: 01/30/20 *  Test: lactic Critical Value: 2.6  Name of Provider Notified: [place] Orders Received? Or Actions Taken?:

## 2020-01-30 NOTE — ED Notes (Signed)
Second IV side attempted with no success, IV team consulted.

## 2020-01-30 NOTE — Progress Notes (Signed)
[facility] for Heparin Indication: atrial fibrillation  Allergies  Allergen Reactions  . Penicillins Hives    Patient reports full body hives that required medical treatment when he was in his 71s or 7s. He tolerated cephalosporins.   Gary Butler [Pregabalin] Swelling  . Levofloxacin Swelling    Patient Measurements: Height: 5\' 11"  (180.3 cm) Weight: 131.5 kg (289 lb 14.5 oz) IBW/kg (Calculated) : 75.3 Heparin Dosing Weight: 105kg  Vital Signs: Temp: 99.4 F (37.4 C) (12/18 1215) Temp Source: Rectal (12/18 1215) BP: 99/70 (12/18 1645) Pulse Rate: 142 (12/18 1645)  Labs: Recent Labs    01/30/20 1246 01/30/20 1640  HGB 16.0  --   HCT 47.5  --   PLT 139*  --   LABPROT 18.8*  --   INR 1.6*  --   CREATININE 1.19  --   TROPONINIHS 28* 29*    Estimated Creatinine Clearance: 74.2 mL/min (by C-G formula based on SCr of 1.19 mg/dL).   Medical History: Past Medical History:  Diagnosis Date  . Arthritis   . CHF (congestive heart failure) ([place])   . Constipation   . Diabetes mellitus   . Heart murmur   . History of gout   . History of skin cancer   . Hypertension   . Morbid (severe) obesity due to excess calories ([place])   . OSA (obstructive sleep apnea)   . Psoriatic arthritis ([place])   . Rectal fissure   . Tinnitus     Medications:  Scheduled:  . diltiazem  20 mg Intravenous Once  . heparin  6,000 Units Intravenous Once  . insulin aspart  0-15 Units Subcutaneous TID WC  . insulin aspart  0-5 Units Subcutaneous QHS  . sodium chloride flush  3 mL Intravenous Q12H    Assessment: Patient is a 89 yom that is being admitted for Nausea and emphysematous cholecystitis. The patient also presented with Afibn with RVR. Patient was previously on Apixaban and rate control medications however stopped the Apix 2/2 bleeding. Pharmacy has been asked to dose heparin at this time for afib.   Goal of Therapy:  Heparin level 0.3-0.7  units/ml Monitor platelets by anticoagulation protocol: Yes   Plan:  - Heparin bolus 6000 units IV x 1 dose - Heparin drip @ 1500 units/hr - Heparin level in ~ 8 hours  - Monitor patient for s/s of bleeding and CBC while on heparin   Gary Butler PharmD. BCPS  01/30/2020,6:21 PM

## 2020-01-30 NOTE — H&P (Addendum)
History and Physical    Gary Butler KLK:[id number] DOB: 01-12-1946 DOA: 01/30/2020  PCP: [name], MD Consultants:  Colbert Ewing - surgery; Claiborne Billings - cardiology; [place] - pulmonology; [place] - endocrinology; [facility] - rheumatology; Delice Lesch - neurology; Alvan Dame - orthopedics; [place] - urology Patient coming from:  Home - lives with wife and son; NOK: Wife, [name], [phone]   Chief Complaint: Nausea, anorexia  HPI: Gary Butler is a 74 y.o. male with medical history significant of OSA; psoriatic arthritis; morbid obesity (BMI 40); HTN; DM; BPH with h/o indwelling foley (not present currently); and chronic diastolic CHF (04/7480 echo with preserved EF and grade 2 diastolic dysfunction) presenting with nausea and anorexia.  He was last admitted from 10/9-13 with hematochezia; prior admission from 9/27-10/5 with perirectal abscess s/p I&D.  During the first September hospitalization, he had new onset afib and was started on Eliquis.  Subsequent hospitalization was deemed related to recent perineal surgery site bleeding and Eliquis was resumed at hospital d/c.  He was discharged to [facility] for rehab and was discharged to home in late November.   He has recently been referred to palliative care as an outpatient.   He has had 3 recent ER visits at [facility], most recently on 12/10.  He reported 8 months of anorexia and post-meal emesis.  Barium swallow study has been performed (11/1) with esophageal contraction and mild dysmotility.  Most recent abdominal CT appears to have been 10/10 with diverticulosis and cholelithiasis.  He also fell and passed out with low BP and he was in rehab at [facility] with d/c shortly after d/c.  He is continuing to just not eating - basically nothing in the last 2 weeks.  He has had to have as much ice water in the last 2 weeks.  He is vomiting mostly phlegm and ?bile.  He is sleeping more recently.  He is not having abdominal pain although his LE are sore and  painful to touch.  He also has chronic back pain.  No fever.  Last BMs were normal, but 2-3 weeks ago since not eating.  He has a dark-colored drizzle at times.  He is urinating pretty regularly.  He has lost at least 100 pounds, started at 361.    ED Course:  Very sick.  Nausea for a month, went to ER several times, admitted at [facility].  BP 68/ on arrival, up to 80/ after IVF.  Pulse 160-170 with new onset afib.  BP and HR improved with IVF.  Has emphysematous cholecystitis, needs medicine admit due to sepsis.  Surgery to see.  Review of Systems: As per HPI; otherwise review of systems reviewed and negative.   Ambulatory Status:  Has not ambulated in at least a week, but sat in his lift chair several days ago.  No ambulation without a cane/walker in at least 6 months.  COVID Vaccine Status:  Complete plus booster  Past Medical History:  Diagnosis Date  . Arthritis   . CHF (congestive heart failure) ([place])   . Constipation   . Diabetes mellitus   . Heart murmur   . History of gout   . History of skin cancer   . Hypertension   . Morbid (severe) obesity due to excess calories ([place])   . OSA (obstructive sleep apnea)   . Psoriatic arthritis ([place])   . Rectal fissure   . Tinnitus     Past Surgical History:  Procedure Laterality Date  . addenoids    .  ANAL FISTULOTOMY  2019   Dr Drue Flirt, [facility]  . IRRIGATION AND DEBRIDEMENT ABSCESS N/A 11/10/2019   Procedure: INCISION AND DRAINAGE OF HIGH SCROTAL ABSCESS;  Surgeon: Michael Boston, MD;  Location: [facility];  Service: General;  Laterality: N/A;  . JOINT REPLACEMENT  2012   RT KNEE  . LUNG BIOPSY     20 YRS AGO  . RECTAL EXAM UNDER ANESTHESIA N/A 11/10/2019   Procedure: ANORECTAL EXAM UNDER ANESTHESIA;  Surgeon: Michael Boston, MD;  Location: [facility];  Service: General;  Laterality: N/A;  . TONSILLECTOMY    . TOTAL KNEE ARTHROPLASTY Bilateral 06/22/2013   Procedure: LEFT TOTAL KNEE ARTHROPLASTY;  MEDIAL SOFT TISSUE EXPLORATION SAPHENOUS NEURECTOMY  RIGHT KNEE;  Surgeon: Mauri Pole, MD;  Location: [facility];  Service: Orthopedics;  Laterality: Bilateral;    Social History   Socioeconomic History  . Marital status: Married    Spouse name: Not on file  . Number of children: 2  . Years of education: Not on file  . Highest education level: Not on file  Occupational History  . 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 Smoking status: Never Smoker  . Smokeless tobacco: Never Used  Vaping Use  . Vaping Use: Never used  Substance and Sexual Activity  . Alcohol use: Yes    Alcohol/week: 0.0 standard drinks    Comment: Once a month  . Drug use: No  . Sexual activity: Never  Other Topics Concern  . Not on file  Social History Narrative   Alcohol- 1 to 2 per month.    Social Determinants of Health   Financial Resource Strain: Not on file  Food Insecurity: Not on file  Transportation Needs: Not on file  Physical Activity: Not on file  Stress: Not on file  Social Connections: Not on file  Intimate Partner Violence: Not on file    Allergies  Allergen Reactions  . Penicillins Hives    Patient reports full body hives that required medical treatment when he was in his 46s or 76s. He tolerated cephalosporins.   [name] [Pregabalin] Swelling  . Levofloxacin Swelling    Family History  Problem Relation Age of Onset  . Emphysema Father   . Clotting disorder Father   . Arthritis Father   . Heart disease Mother   . Arthritis Mother   . Arthritis Maternal Grandmother   . Hypertension Maternal Grandmother   . Diabetes Maternal Grandmother   . Arthritis Paternal Grandmother   . Diabetes Maternal Aunt   . Diabetes Paternal Aunt     Prior to Admission medications   Medication Sig Start Date End Date Taking? Authorizing Provider  Albuterol Sulfate (PROAIR RESPICLICK) 537 (90 Base) MCG/ACT AEPB Inhale 2 puffs into the lungs every 6 (six) hours as needed (for wheezing  or shortness of breath). Patient not taking: Reported on 01/25/2020 12/10/19   Ngetich, Dinah C, NP  allopurinol (ZYLOPRIM) 300 MG tablet TAKE 1 TABLET(300 MG) BY MOUTH DAILY Patient not taking: Reported on 01/25/2020 12/10/19   Ngetich, Dinah C, NP  atorvastatin (LIPITOR) 40 MG tablet TAKE 1 TABLET(40 MG) BY MOUTH EVERY EVENING Patient not taking: Reported on 01/25/2020 12/10/19   Ngetich, Dinah C, NP  bisoprolol (ZEBETA) 5 MG tablet Take 0.5 tablets (2.5 mg total) by mouth daily. Patient not taking: Reported on 01/25/2020 12/10/19 02/08/20  Ngetich, Dinah C, NP  cetirizine (ZYRTEC) 10 MG tablet TAKE 1  TABLET(10 MG) BY MOUTH DAILY Patient not taking: Reported on 01/25/2020 02/09/19   [name], MD  Cholecalciferol (VITAMIN D) 2000 UNITS tablet Take 2,000 Units by mouth daily. Patient not taking: Reported on 01/25/2020    [provider]  Cyanocobalamin (B-12) 2000 MCG TABS Take 2,000 mcg by mouth daily.  Patient not taking: Reported on 01/25/2020    [provider]  donepezil (ARICEPT) 10 MG tablet TAKE 1 TABLET(10 MG) BY MOUTH AT BEDTIME Patient not taking: Reported on 01/25/2020 12/10/19   Ngetich, Dinah C, NP  fluocinonide cream (LIDEX) 5.09 % Apply 1 application topically 2 (two) times daily. To arms for psoriasis Patient not taking: Reported on 01/25/2020 12/10/19   Ngetich, Dinah C, NP  fluticasone (FLONASE) 50 MCG/ACT nasal spray Place 2 sprays into both nostrils daily. Patient not taking: Reported on 01/25/2020 12/10/19   Ngetich, Dinah C, NP  folic acid (FOLVITE) 1 MG tablet Take 1 mg by mouth daily.  Patient not taking: Reported on 01/25/2020 04/29/12   [provider]  furosemide (LASIX) 20 MG tablet TAKE 1 TABLET(20 MG) BY MOUTH DAILY Patient not taking: Reported on 01/25/2020 12/10/19   Ngetich, Dinah C, NP  JARDIANCE 10 MG TABS tablet TAKE 1 TABLET BY MOUTH DAILY BEFORE BREAKFAST Patient not taking: Reported on 01/25/2020 12/15/19   [name], MD  meloxicam (MOBIC) 15 MG tablet Take 1 tablet by mouth daily. Patient not taking: Reported on 01/25/2020 12/10/19   Ngetich, Dinah C, NP  nystatin ointment (MYCOSTATIN) Apply 1 application topically daily. Apply to groin and perineal area Patient not taking: Reported on 01/25/2020 12/10/19   Ngetich, Dinah C, NP  omeprazole (PRILOSEC) 40 MG capsule TAKE 1 CAPSULE BY MOUTH 30 TO 60 MINUTES BEFORE YOUR FIRST AND LAST MEAL OF THE DAY 12/10/19   Ngetich, Dinah C, NP  potassium chloride (MICRO-K) 10 MEQ CR capsule TAKE 1 CAPSULE BY MOUTH EVERY DAY Patient not taking: Reported on 01/25/2020 12/10/19   Ngetich, Dinah C, NP  promethazine (PHENERGAN) 25 MG tablet Take 1 tablet (25 mg total) by mouth every 8 (eight) hours as needed for nausea or vomiting. Alternate w/ Ondansetron (Zofran) 01/27/20   [name], MD  psyllium (HYDROCIL/METAMUCIL) 95 % PACK Take 1 packet by mouth 2 (two) times daily. Patient not taking: Reported on 01/25/2020 11/15/19 02/13/20  Kayleen Memos, DO  tiZANidine (ZANAFLEX) 4 MG tablet Take 0.5-1 tablets (2-4 mg total) by mouth every 8 (eight) hours as needed for muscle spasms. Patient not taking: Reported on 01/25/2020 12/28/19   Brunetta Jeans, [place]    Physical Exam: Vitals:   01/30/20 1420 01/30/20 1430 01/30/20 1500 01/30/20 1645  BP:  93/80 99/85 99/70   Pulse: (!) 135 (!) 125 (!) 135 (!) 142  Resp: (!) 23 14 18  (!) 26  Temp:      TempSrc:      SpO2: 100% 99% 100% 97%  Weight:      Height:         . General:  Appears ill, flushed, with periodic retching and small amounts of bilious emesis . Eyes:  PERRL, EOMI, normal lids, iris . ENT:  grossly normal hearing, lips & tongue, mildly dry mm; suboptimal dentition . Neck:  no LAD, masses or thyromegaly . Cardiovascular:  Irregularly irregular with tachycardia, no m/r/g. No LE edema.  Marland Kitchen Respiratory:   CTA bilaterally with no wheezes/rales/rhonchi.  Normal respiratory effort. . Abdomen:  soft,  B LQ TTP > UQ (interestingly),  ND, hypoactive BS . Skin:  no rash or induration seen on limited exam . Musculoskeletal:  grossly normal tone BUE/BLE, good ROM, no bony abnormality; LE are very TTP, even superficial palpation . Lower extremity:  No LE edema.  Limited foot exam with no ulcerations.  2+ distal pulses. Marland Kitchen Psychiatric:  Blunted mood and affect, speech fluent and appropriate, AOx3 . Neurologic:  CN 2-12 grossly intact, moves all extremities in coordinated fashion, painful LE to superficial touch, as noted above    Radiological Exams on Admission: Independently reviewed - see discussion in A/P where applicable  CT ABDOMEN PELVIS WO CONTRAST  Result Date: 01/30/2020 CLINICAL DATA:  74 year old with lack of appetite.  Abdominal pain. EXAM: CT ABDOMEN AND PELVIS WITHOUT CONTRAST TECHNIQUE: Multidetector CT imaging of the abdomen and pelvis was performed following the standard protocol without IV contrast. COMPARISON:  11/22/2019 FINDINGS: Lower chest: Dependent densities at the lung bases. No significant pleural effusions. Small amount of pericardial fluid along the anterior aspect of the heart. Hepatobiliary: Evidence for gallstones with an air-fluid level in the gallbladder. No significant gallbladder distension. There may be minimal pericholecystic stranding. Normal appearance of the liver. No significant biliary dilatation. Pancreas: Unremarkable. No pancreatic ductal dilatation or surrounding inflammatory changes. Spleen: Normal in size without focal abnormality. Adrenals/Urinary Tract: Normal appearance of the adrenal glands. Bilateral renal cysts. Large cyst in the right kidney upper pole that measures up to 7.5 cm. Negative for hydronephrosis. Urinary bladder is markedly distended. No evidence for urinary stones. Stomach/Bowel: Diffuse wall thickening involving the rectum. There is perirectal edema and a small amount of presacral fluid or edema. Normal appearance of the stomach. No  evidence for bowel obstruction. Questionable wall thickening involving the right colon on sequence 3 image 42. Vascular/Lymphatic: Atherosclerotic calcifications involving the abdominal aorta without aneurysm. No lymph node enlargement in the abdomen or pelvis. Reproductive: Prostate is unremarkable. Other: Fat containing inguinal hernias, left side greater than right. Perirectal stranding with some presacral edema. No significant ascites. Negative for free intraperitoneal air. Musculoskeletal: No acute bone abnormality. IMPRESSION: 1. Cholelithiasis with new air-fluid level in the gallbladder. Evidence for mild pericholecystic stranding. 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 Recommend surgical consultation. 2. Diffuse wall thickening involving the rectum with perirectal edema. Findings are suggestive for inflammation and proctitis. Underlying neoplastic process cannot be excluded and recommend GI consultation. Questionable wall thickening involving the right colon. 3. Distention of the urinary bladder without hydronephrosis. 4. Bilateral renal cysts. These results were called by telephone at the time of interpretation on 01/30/2020 at 2:25 pm to provider [facility] , who verbally acknowledged these results. Electronically Signed   By: Markus Daft M.D.   On: 01/30/2020 14:30   DG Chest Port 1 View  Result Date: 01/30/2020 CLINICAL DATA:  Dyspnea EXAM: PORTABLE CHEST 1 VIEW COMPARISON:  10/01/2018 chest radiograph. FINDINGS: Stable cardiomediastinal silhouette with mild cardiomegaly. No pneumothorax. Chronic mild bilateral pleural thickening without pleural effusions. No pulmonary edema. No acute consolidative airspace disease. Low lung volumes. IMPRESSION: 1. Stable mild cardiomegaly without pulmonary edema. 2. Low lung volumes with no active pulmonary disease. Electronically Signed   By: Ilona Sorrel M.D.   On: 01/30/2020 13:37    EKG: Independently reviewed.   Afib with rate 154; nonspecific ST changes that are likely rate-related   Labs on Admission: I have personally reviewed the available labs and imaging studies at the time of the admission.  Pertinent labs:   K+ 2.7 Glucose  186 Anion gap 19 Mag++ 1.5 Albumin 2.6 Bili 3.0 HS troponin 28 Lactate 2.6 WBC 6.1 Platelets 139 INR 1.6 COVID/flu negative   Assessment/Plan Principal Problem:   Sepsis due to undetermined organism (HCC) Active Problems:   OSA on CPAP   Hyperlipidemia   BPH (benign prostatic hyperplasia)   Obesity, Class III, BMI 40-49.9 (morbid obesity) ([place])   Essential hypertension   Uncontrolled type 2 diabetes mellitus with hyperglycemia, with long-term current use of insulin (HCC)   Perineal abscess   Chronic diastolic congestive heart failure (HCC)   Acute emphysematous cholecystitis    Sepsis -Sepsis indicates life-threatening organ dysfunction with mortality >10%, caused by dysregulation to host response.   -SIRS criteria in this patient includes: , tachycardia, tachypnea -Patient has evidence of acute organ failure with elevated lactate >2; bilirubin 2 that is not easily explained by another condition. -While awaiting blood cultures, this appears to be a preseptic condition. -Sepsis protocol initiated -Worse outcomes are predicted from sepsis with 2 of the following: RR > 22; AMS , GCS < 13; or SBP <100.  This patient meets 2 of these criteria. -Suspected source is emphysematous/gangrenous cholecystitis -Blood and urine cultures pending -Treat with IV Cefepime/Flagyl -Will trend lactate to ensure improvement -Will order procalcitonin level.  Antibiotics would not be indicated for PCT <0.1 and probably should not be used for < 0.25.  >0.5 indicates infection and >>0.5 indicates more serious disease.  As the procalcitonin level normalizes, it will be reasonable to consider de-escalation of antibiotic coverage. -This patient is at risk for shock and may  require vasopressors to keep MAP >65 and/or due to lactate >2 despite volume resuscitation; shock is associated with >40% mortality.  Cholecystitis -Patient with months of anorexia, weight loss, n/v -Now here with bilious vomiting -He did have gallstones back in October but no significant other gallbladder findings at that time -Now with sepsis physiology and imaging is very concerning - appears to show emphysematous and/or gangrenous gallbladder -Will need operative repair, planned for tomorrow --NPO - he ate 2 ice chips while I was in the room and had retching that followed -Morphine for pain, Zofran/Phenergan for nausea -Surgery consulting for probable cholecystectomy -Empiric coverage with Cefepime and Flagyl for now  Perirectal thickening -Patient with perirectal abscess in September and subsequent bleeding episode in October -Thickening appreciated on CT today -Surgery is concerned about fistula formation and will evaluate -This issue should be adequately treated with current antibiotics for now  Afib -Diagnosed in September -He was initially started on Eliquis but this was hold due to rectal bleeding and then stopped -Will admit to SDU for Diltiazem drip as per protocol -HS troponin minimally elevated, likely demand ischemia; will repeat but low suspicion for ACS -For now, will start Heparin drip -Will need resumption of Eliquis post-operatively -If unable to be on [facility], will need consideration of Watchman procedure  Chronic diastolic CHF -September echo with preserved EF and grade 2 diastolic dysfunction -He is on the dry side now -Will need to monitor for volume overload in the setting of afib + sepsis with IVF -He has not been taking Lasix (or other medications)  HTN -Takes Bisoprolol normally but not currently -Continue to hold while on Dilt drip  DM -Prior A1c was 9.1, indicating very poor control -Recheck A1c -Hold Jardiance -Will cover with moderate-scale SSI for  now including qhs -Needs good control for healing  Dementia -Mild, not evident on today's exam -Aricept is on hold while NPO; he has not  been taking any medications recently  BPH -Prior need for foley for retention -Reports voiding well now -Appropriate renal function currently  OSA -Continue BIPAP qhs  Obesity -Body mass index is 40.43 kg/m..  -Weight loss should be encouraged -Outpatient PCP/bariatric medicine f/u encouraged  Goals of care/DNR -Patient has an outpatient palliative care appointment scheduled for later this week with [facility] [place] -He prefers to continue with that agency and so will reschedule his appointment, no inpatient consult requested -He has had 2 recent SNF rehab stays but has been unable to eat while inpatient; hopefully he will have improvement and be able to enjoy PO intake again  -Will benefit from nutrition evaluation once eating again -He appears likely to need return to SNF rehab after this hospitalization -I have discussed code status with the patient and his wife and  they are in agreement that the patient would not desire resuscitation and would prefer to die a natural death should that situation arise. -He will need a gold out of facility DNR form at the time of discharge    Note: This patient has been tested and is negative for the novel coronavirus COVID-19. He has been fully vaccinated against COVID-19.    DVT prophylaxis:  Heparin Code Status:  DNRl - confirmed with patient/family Family Communication: Wife present throughout evaluation  Disposition Plan:  The patient is from: home  Anticipated d/c is to: SNF  Anticipated d/c date will depend on clinical response to treatment, but likely mid to late next week  Patient is currently: acutely ill Consults called: Surgery Admission status: Admit - It is my clinical opinion that admission to INPATIENT is reasonable and necessary because of the expectation that this  patient will require hospital care that crosses at least 2 midnights to treat this condition based on the medical complexity of the problems presented.  Given the aforementioned information, the predictability of an adverse outcome is felt to be significant.   Karmen Bongo MD [facility]   How to contact the [facility] Attending or Consulting provider [place] or covering provider during after hours [place], for this patient?  1. Check the care team in [facility] and look for a) attending/consulting [facility] provider listed and b) the [facility] team listed 2. Log into [email or web address] and use [place]'s universal password to access. If you do not have the password, please contact the hospital operator. 3. Locate the [facility] provider you are looking for under [facility] and page to a number that you can be directly reached. 4. If you still have difficulty reaching the provider, please page the [facility] (Director on Call) for the Hospitalists listed on amion for assistance.   01/30/2020, 5:13 PM

## 2020-01-30 NOTE — ED Notes (Signed)
Patient transported to X-ray 

## 2020-01-30 NOTE — Consult Note (Signed)
Reason for Consult: vomiting Referring Physician: Karmen Bongo, M.D.  Gary Butler is an 74 y.o. male.  HPI: 74 yo male with multiple medical problems. He has had progressive nausea and vomiting over the last 2 months. He has lost considerable weight. He complains of some vague abdominal pains also. He had nec fasciitis 4 months ago and underwent debridement and has had wound issues and weight loss during the healing phase.  Past Medical History:  Diagnosis Date  . Arthritis   . CHF (congestive heart failure) ([place])   . Constipation   . Diabetes mellitus   . Heart murmur   . History of gout   . History of skin cancer   . Hypertension   . Morbid (severe) obesity due to excess calories ([place])   . OSA (obstructive sleep apnea)   . Psoriatic arthritis ([place])   . Rectal fissure   . Tinnitus     Past Surgical History:  Procedure Laterality Date  . addenoids    . ANAL FISTULOTOMY  2019   Dr Drue Flirt, [facility]  . IRRIGATION AND DEBRIDEMENT ABSCESS N/A 11/10/2019   Procedure: INCISION AND DRAINAGE OF HIGH SCROTAL ABSCESS;  Surgeon: Michael Boston, MD;  Location: [facility];  Service: General;  Laterality: N/A;  . JOINT REPLACEMENT  2012   RT KNEE  . LUNG BIOPSY     20 YRS AGO  . RECTAL EXAM UNDER ANESTHESIA N/A 11/10/2019   Procedure: ANORECTAL EXAM UNDER ANESTHESIA;  Surgeon: Michael Boston, MD;  Location: [facility];  Service: General;  Laterality: N/A;  . TONSILLECTOMY    . 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 Emphysema Father   . Clotting disorder Father   . Arthritis Father   . Heart disease Mother   . Arthritis Mother   . Arthritis Maternal Grandmother   . Hypertension Maternal Grandmother   . Diabetes Maternal Grandmother   .  Arthritis Paternal Grandmother   . Diabetes Maternal Aunt   . Diabetes Paternal Aunt     Social History:  reports that he has never smoked. He has never used smokeless tobacco. He reports current alcohol use. He reports that he does not use drugs.  Allergies:  Allergies  Allergen Reactions  . Penicillins Hives    Patient reports full body hives that required medical treatment when he was in his 60s or 35s. He tolerated cephalosporins.   [name] [Pregabalin] Swelling  . Levofloxacin Swelling    Medications: I have reviewed the patient's current medications.  Results for orders placed or performed during the hospital encounter of 01/30/20 (from the past 48 hour(s))  Resp Panel by RT-PCR (Flu A&B, Covid) Nasopharyngeal Swab     Status: None   Collection Time: 01/30/20 12:38 PM   Specimen: Nasopharyngeal Swab; Nasopharyngeal(NP) swabs in vial transport medium  Result Value Ref Range   SARS Coronavirus 2 by RT PCR NEGATIVE NEGATIVE    Comment: (NOTE) SARS-CoV-2 target nucleic acids are NOT DETECTED.  The SARS-CoV-2 RNA is generally detectable in upper respiratory specimens during the acute phase of infection. The lowest concentration of SARS-CoV-2 viral copies this assay can detect is 138 copies/mL. A negative result does not preclude SARS-Cov-2 infection and should not be used as the sole basis for  treatment or other patient management decisions. A negative result may occur with  improper specimen collection/handling, submission of specimen other than nasopharyngeal swab, presence of viral mutation(s) within the areas targeted by this assay, and inadequate number of viral copies(<138 copies/mL). A negative result must be combined with clinical observations, patient history, and epidemiological information. The expected result is Negative.  Fact Sheet for Patients:  EntrepreneurPulse.com.au  Fact Sheet for Healthcare Providers:   IncredibleEmployment.be  This test is no t yet approved or cleared by the [place] FDA and  has been authorized for detection and/or diagnosis of SARS-CoV-2 by FDA under an Emergency Use Authorization (EUA). This EUA will remain  in effect (meaning this test can be used) for the duration of the COVID-19 declaration under Section 564(b)(1) of the Act, 21 U.S.C.section 360bbb-3(b)(1), unless the authorization is terminated  or revoked sooner.       Influenza A by PCR NEGATIVE NEGATIVE   Influenza B by PCR NEGATIVE NEGATIVE    Comment: (NOTE) The Xpert Xpress SARS-CoV-2/FLU/RSV plus assay is intended as an aid in the diagnosis of influenza from Nasopharyngeal swab specimens and should not be used as a sole basis for treatment. Nasal washings and aspirates are unacceptable for Xpert Xpress SARS-CoV-2/FLU/RSV testing.  Fact Sheet for Patients: EntrepreneurPulse.com.au  Fact Sheet for Healthcare Providers: IncredibleEmployment.be  This test is not yet approved or cleared by the [place] FDA and has been authorized for detection and/or diagnosis of SARS-CoV-2 by FDA under an Emergency Use Authorization (EUA). This EUA will remain in effect (meaning this test can be used) for the duration of the COVID-19 declaration under Section 564(b)(1) of the Act, 21 U.S.C. section 360bbb-3(b)(1), unless the authorization is terminated or revoked.  Performed at [facility], [place] [address], [place], [place] [address]   Magnesium     Status: Abnormal   Collection Time: 01/30/20 12:46 PM  Result Value Ref Range   Magnesium 1.5 (L) 1.7 - 2.4 mg/dL    Comment: Performed at [facility] [address] [place], [place] [address]  CBC     Status: Abnormal   Collection Time: 01/30/20 12:46 PM  Result Value Ref Range   WBC 6.1 4.0 - 10.5 K/uL   RBC 5.27 4.22 - 5.81 MIL/uL   Hemoglobin 16.0 13.0 - 17.0 g/dL   HCT 47.5  39.0 - 52.0 %   MCV 90.1 80.0 - 100.0 fL   MCH 30.4 26.0 - 34.0 pg   MCHC 33.7 30.0 - 36.0 g/dL   RDW 15.9 (H) 11.5 - 15.5 %   Platelets 139 (L) 150 - 400 K/uL   nRBC 0.0 0.0 - 0.2 %    Comment: Performed at [facility] [address] [place], [place] [address]  Comprehensive metabolic panel     Status: Abnormal   Collection Time: 01/30/20 12:46 PM  Result Value Ref Range   Sodium 137 135 - 145 mmol/L   Potassium 2.7 (LL) 3.5 - 5.1 mmol/L    Comment: CRITICAL RESULT CALLED TO, READ BACK BY AND VERIFIED WITH: J LABRON,RN 01/30/2020 1339 WILDERK    Chloride 97 (L) 98 - 111 mmol/L   CO2 21 (L) 22 - 32 mmol/L   Glucose, Bld 186 (H) 70 - 99 mg/dL    Comment: Glucose reference range applies only to samples taken after fasting for at least 8 hours.   BUN 13 8 - 23 mg/dL   Creatinine, Ser 1.19 0.61 - 1.24 mg/dL  Calcium 9.7 8.9 - 10.3 mg/dL   Total Protein 5.2 (L) 6.5 - 8.1 g/dL   Albumin 2.6 (L) 3.5 - 5.0 g/dL   AST 13 (L) 15 - 41 U/L   ALT 11 0 - 44 U/L   Alkaline Phosphatase 71 38 - 126 U/L   Total Bilirubin 3.0 (H) 0.3 - 1.2 mg/dL   GFR, Estimated >60 >60 mL/min    Comment: (NOTE) Calculated using the CKD-EPI Creatinine Equation (2021)    Anion gap 19 (H) 5 - 15    Comment: Performed at [facility] [address] [place], [place] [address]  Protime-INR (if pt is taking coumadin)     Status: Abnormal   Collection Time: 01/30/20 12:46 PM  Result Value Ref Range   Prothrombin Time 18.8 (H) 11.4 - 15.2 seconds   INR 1.6 (H) 0.8 - 1.2    Comment: (NOTE) INR goal varies based on device and disease states. Performed at [facility], [place] [address] [place], [place] [address]   Troponin I (High Sensitivity)     Status: Abnormal   Collection Time: 01/30/20 12:46 PM  Result Value Ref Range   Troponin I (High Sensitivity) 28 (H) <18 ng/L    Comment: (NOTE) Elevated high sensitivity troponin I (hsTnI) values and significant  changes across serial  measurements may suggest ACS but many other  chronic and acute conditions are known to elevate hsTnI results.  Refer to the Links section for chest pain algorithms and additional  guidance. Performed at [facility], [place] [address], [place], [place] [address]   TSH     Status: None   Collection Time: 01/30/20 12:46 PM  Result Value Ref Range   TSH 1.121 0.350 - 4.500 uIU/mL    Comment: Performed by a 3rd Generation assay with a functional sensitivity of <=0.01 uIU/mL. Performed at [facility], [place] [address], [place], [place] [address]   Lipase, blood     Status: None   Collection Time: 01/30/20 12:46 PM  Result Value Ref Range   Lipase 48 11 - 51 U/L    Comment: Performed at [facility] [address] [place], [place] [address]  Lactic acid, plasma     Status: Abnormal   Collection Time: 01/30/20  1:38 PM  Result Value Ref Range   Lactic Acid, Venous 2.6 (HH) 0.5 - 1.9 mmol/L    Comment: CRITICAL RESULT CALLED TO, READ BACK BY AND VERIFIED WITH: J BLUE,RN 01/30/2020 1414 WILDERK Performed at [facility], [place] [address], [place], [place] [address]     CT ABDOMEN PELVIS WO CONTRAST  Result Date: 01/30/2020 CLINICAL DATA:  74 year old with lack of appetite.  Abdominal pain. EXAM: CT ABDOMEN AND PELVIS WITHOUT CONTRAST TECHNIQUE: Multidetector CT imaging of the abdomen and pelvis was performed following the standard protocol without IV contrast. COMPARISON:  11/22/2019 FINDINGS: Lower chest: Dependent densities at the lung bases. No significant pleural effusions. Small amount of pericardial fluid along the anterior aspect of the heart. Hepatobiliary: Evidence for gallstones with an air-fluid level in the gallbladder. No significant gallbladder distension. There may be minimal pericholecystic stranding. Normal appearance of the liver. No significant biliary dilatation. Pancreas: Unremarkable. No pancreatic ductal dilatation or surrounding inflammatory changes.  Spleen: Normal in size without focal abnormality. Adrenals/Urinary Tract: Normal appearance of the adrenal glands. Bilateral renal cysts. Large cyst in the right kidney upper pole that measures up to 7.5 cm. Negative for hydronephrosis.  Urinary bladder is markedly distended. No evidence for urinary stones. Stomach/Bowel: Diffuse wall thickening involving the rectum. There is perirectal edema and a small amount of presacral fluid or edema. Normal appearance of the stomach. No evidence for bowel obstruction. Questionable wall thickening involving the right colon on sequence 3 image 42. Vascular/Lymphatic: Atherosclerotic calcifications involving the abdominal aorta without aneurysm. No lymph node enlargement in the abdomen or pelvis. Reproductive: Prostate is unremarkable. Other: Fat containing inguinal hernias, left side greater than right. Perirectal stranding with some presacral edema. No significant ascites. Negative for free intraperitoneal air. Musculoskeletal: No acute bone abnormality. IMPRESSION: 1. Cholelithiasis with new air-fluid level in the gallbladder. Evidence for mild pericholecystic stranding. 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 Recommend surgical consultation. 2. Diffuse wall thickening involving the rectum with perirectal edema. Findings are suggestive for inflammation and proctitis. Underlying neoplastic process cannot be excluded and recommend GI consultation. Questionable wall thickening involving the right colon. 3. Distention of the urinary bladder without hydronephrosis. 4. Bilateral renal cysts. These results were called by telephone at the time of interpretation on 01/30/2020 at 2:25 pm to provider [facility] , who verbally acknowledged these results. Electronically Signed   By: Markus Daft M.D.   On: 01/30/2020 14:30   DG Chest Port 1 View  Result Date: 01/30/2020 CLINICAL DATA:  Dyspnea EXAM: PORTABLE CHEST 1 VIEW COMPARISON:   10/01/2018 chest radiograph. FINDINGS: Stable cardiomediastinal silhouette with mild cardiomegaly. No pneumothorax. Chronic mild bilateral pleural thickening without pleural effusions. No pulmonary edema. No acute consolidative airspace disease. Low lung volumes. IMPRESSION: 1. Stable mild cardiomegaly without pulmonary edema. 2. Low lung volumes with no active pulmonary disease. Electronically Signed   By: Ilona Sorrel M.D.   On: 01/30/2020 13:37    Review of Systems  Constitutional: Positive for malaise/fatigue and weight loss.  Gastrointestinal: Positive for abdominal pain, nausea and vomiting.  All other systems reviewed and are negative.   PE Blood pressure 99/85, pulse (!) 135, temperature 99.4 F (37.4 C), temperature source Rectal, resp. rate 18, height 5\' 11"  (1.803 m), weight 131.5 kg, SpO2 100 %. Constitutional: NAD; conversant; no deformities Eyes: Moist conjunctiva; no lid lag; anicteric; PERRL Neck: Trachea midline; no thyromegaly Lungs: Normal respiratory effort; no tactile fremitus CV: RRR; no palpable thrills; no pitting edema GI: Abd soft, tenderness in mid abdomne; no palpable hepatosplenomegaly MSK: Normal gait; no clubbing/cyanosis Psychiatric: Appropriate affect; alert and oriented x3 Lymphatic: No palpable cervical or axillary lymphadenopathy Skin: No major subcutaneous nodules. Warm and dry   Assessment/Plan: 74 yo male with multiple medical problems. He has nausea and vomiting and some abdominal pain. Most tender in mid abdomen. 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 -IV abx -continue fluid resuscitation/replace electrolytes -hopefully surgery for lap chole in am. Surgery in this situation can be difficult and we talked through details of surgical intervention and radiologic tube placement. Given emphysematous findings tube placement has a higher likelihood of failure.  Arta Bruce Camara [place]strom 01/30/2020, 4:25 PM

## 2020-01-30 NOTE — ED Notes (Signed)
Date and time results received: 01/30/20   Test: Potassium Critical Value: 2.7  Name of Provider Notified: [place]

## 2020-01-31 ENCOUNTER — Encounter (HOSPITAL_COMMUNITY)
Admission: EM | Disposition: A | Discharge status: Skilled Nursing Facility | Payer: Self-pay | Source: Home / Self Care | Attending: Family Medicine

## 2020-01-31 DIAGNOSIS — K810 Acute cholecystitis: Secondary | ICD-10-CM

## 2020-01-31 DIAGNOSIS — A419 Sepsis, unspecified organism: Principal | ICD-10-CM

## 2020-01-31 DIAGNOSIS — I4891 Unspecified atrial fibrillation: Secondary | ICD-10-CM

## 2020-01-31 LAB — COMPREHENSIVE METABOLIC PANEL
ALT: 10 U/L (ref 0–44)
AST: 10 U/L — ABNORMAL LOW (ref 15–41)
Albumin: 2.2 g/dL — ABNORMAL LOW (ref 3.5–5.0)
Alkaline Phosphatase: 61 U/L (ref 38–126)
Anion gap: 18 — ABNORMAL HIGH (ref 5–15)
BUN: 13 mg/dL (ref 8–23)
CO2: 19 mmol/L — ABNORMAL LOW (ref 22–32)
Calcium: 8.9 mg/dL (ref 8.9–10.3)
Chloride: 101 mmol/L (ref 98–111)
Creatinine, Ser: 0.97 mg/dL (ref 0.61–1.24)
GFR, Estimated: >60 mL/min (ref >60)
Glucose, Bld: 167 mg/dL — ABNORMAL HIGH (ref 70–99)
Potassium: 2.8 mmol/L — ABNORMAL LOW (ref 3.5–5.1)
Sodium: 138 mmol/L (ref 135–145)
Total Bilirubin: 1.8 mg/dL — ABNORMAL HIGH (ref 0.3–1.2)
Total Protein: 4.3 g/dL — ABNORMAL LOW (ref 6.5–8.1)

## 2020-01-31 LAB — GLUCOSE, CAPILLARY
Glucose-Capillary: 144 mg/dL — ABNORMAL HIGH (ref 70–99)
Glucose-Capillary: 146 mg/dL — ABNORMAL HIGH (ref 70–99)
Glucose-Capillary: 158 mg/dL — ABNORMAL HIGH (ref 70–99)
Glucose-Capillary: 138 mg/dL — ABNORMAL HIGH (ref 70–99)
Glucose-Capillary: 121 mg/dL — ABNORMAL HIGH (ref 70–99)

## 2020-01-31 LAB — CBC
HCT: 42.3 % (ref 39.0–52.0)
Hemoglobin: 14.3 g/dL (ref 13.0–17.0)
MCH: 30.4 pg (ref 26.0–34.0)
MCHC: 33.8 g/dL (ref 30.0–36.0)
MCV: 90 fL (ref 80.0–100.0)
Platelets: 116 10*3/uL — ABNORMAL LOW (ref 150–400)
RBC: 4.7 MIL/uL (ref 4.22–5.81)
RDW: 16 % — ABNORMAL HIGH (ref 11.5–15.5)
WBC: 4.9 10*3/uL (ref 4.0–10.5)
nRBC: 0 % (ref 0.0–0.2)

## 2020-01-31 LAB — MRSA PCR SCREENING: MRSA by PCR: NEGATIVE

## 2020-01-31 LAB — APTT: aPTT: >200 seconds (ref 24–36)

## 2020-01-31 LAB — HEPARIN LEVEL (UNFRACTIONATED): Heparin Unfractionated: 1.04 IU/mL — ABNORMAL HIGH (ref 0.30–0.70)

## 2020-01-31 SURGERY — LAPAROSCOPIC CHOLECYSTECTOMY
Anesthesia: General

## 2020-01-31 MED ORDER — SODIUM CHLORIDE 0.9 % IV SOLN
2.0000 g | Freq: Three times a day (TID) | INTRAVENOUS | Status: AC
  Administered 2020-01-31 – 2020-02-04 (×13): 2 g via INTRAVENOUS
  Filled 2020-01-31 (×13): qty 2

## 2020-01-31 MED ORDER — METRONIDAZOLE IN NACL 5-0.79 MG/ML-% IV SOLN
500.0000 mg | Freq: Three times a day (TID) | INTRAVENOUS | Status: AC
Start: 2020-01-31 — End: 2020-02-04
  Administered 2020-01-31 – 2020-02-04 (×13): 500 mg via INTRAVENOUS
  Filled 2020-01-31 (×13): qty 100

## 2020-01-31 MED ORDER — MAGNESIUM SULFATE 2 GM/50ML IV SOLN
2.0000 g | Freq: Once | INTRAVENOUS | Status: AC
  Administered 2020-01-31: 2 g via INTRAVENOUS
  Filled 2020-01-31: qty 50

## 2020-01-31 MED ORDER — POTASSIUM CHLORIDE 10 MEQ/100ML IV SOLN
10.0000 meq | INTRAVENOUS | Status: AC
  Administered 2020-01-31 (×5): 10 meq via INTRAVENOUS
  Filled 2020-01-31 (×3): qty 100

## 2020-01-31 MED ORDER — BISOPROLOL FUMARATE 5 MG PO TABS
5.0000 mg | ORAL_TABLET | Freq: Every day | ORAL | Status: DC
  Administered 2020-01-31 – 2020-02-03 (×2): 5 mg via ORAL
  Filled 2020-01-31 (×2): qty 1

## 2020-01-31 MED ORDER — POTASSIUM CHLORIDE CRYS ER 20 MEQ PO TBCR: 40.0000 meq | EXTENDED_RELEASE_TABLET | Freq: Once | ORAL | Status: DC

## 2020-01-31 MED ORDER — HEPARIN SODIUM (PORCINE) 5000 UNIT/ML IJ SOLN
5000.0000 [IU] | Freq: Three times a day (TID) | INTRAMUSCULAR | Status: DC
  Administered 2020-01-31 – 2020-02-03 (×7): 5000 [IU] via SUBCUTANEOUS
  Filled 2020-01-31 (×8): qty 1

## 2020-01-31 NOTE — Progress Notes (Signed)
[place] for Heparin Indication: atrial fibrillation  Allergies  Allergen Reactions  . Penicillins Hives    Patient reports full body hives that required medical treatment when he was in his 48s or 46s. He tolerated cephalosporins.   [name] [Pregabalin] Swelling  . Levofloxacin Swelling    Patient Measurements: Height: 5\' 11"  (180.3 cm) Weight: 118.4 kg (261 lb 0.4 oz) IBW/kg (Calculated) : 75.3 Heparin Dosing Weight: 105kg  Vital Signs: Temp: 97.6 F (36.4 C) (12/18 2328) Temp Source: Axillary (12/18 2328) BP: 116/78 (12/18 2328) Pulse Rate: 94 (12/18 2328)  Labs: Recent Labs    01/30/20 1246 01/30/20 1640 01/31/20 0346  HGB 16.0  --   --   HCT 47.5  --   --   PLT 139*  --   --   LABPROT 18.8*  --   --   INR 1.6*  --   --   HEPARINUNFRC  --   --  1.04*  CREATININE 1.19  --  0.97  TROPONINIHS 28* 29*  --     Estimated Creatinine Clearance: 86.1 mL/min (by C-G formula based on SCr of 0.97 mg/dL).   Assessment: 74 y.o. male with h/o Afib for heparin  Goal of Therapy:  Heparin level 0.3-0.7 units/ml Monitor platelets by anticoagulation protocol: Yes   Plan:  Decrease Heparin 1250 units/hr Check heparin level in 8 hours.    Frayda Egley, [place] Curb PharmD. BCPS  01/31/2020,4:47 AM

## 2020-01-31 NOTE — Consult Note (Addendum)
Cardiology Consultation:   Patient ID: Gary Butler MRN: [id number]; DOB: [birth year]  Admit date: 01/30/2020 Date of Consult: 01/31/2020  Primary Care Provider: [name], MD [facility] Cardiologist: Shelva Majestic, MD  [place] Electrophysiologist:  None    Patient Profile:   Gary Butler is a 74 y.o. male with a hx of OSA on Bipap, morbid obesity, heart murmur, DM2, GERD, HTN, afib on Eliquis who is being seen today for the evaluation of pre-operative cardiac evaluation at the request of Dr. Broadus John.  History of Present Illness:   [name] is followed by Dr. Claiborne Billings for the above cardiac issues. He has no prior history of CAD. He had a myoview in 2012 that was normal and repeat myoview in 11/2017 showed EF 55-65% that was low risk and normal. Echo 09/2017 showed LVEF 60-65%, moderate LVH, no wall motion abnormality, G2DD, mild AS, mildly dilated ascending aorta measuring 72mmg. The patient was last seen 11/12/19 for new onset afib during an admission for perirectal absess/infection and started on Eliquis for CHADSVSC 3. Echo at that time showed EF 60-65%, G2DD He was set up with the afib clinic however did not follow-up. He was hospitalized again in September where eliquis was held for bleeding of the perirenal surgery site. Eliquis was resumed d/c. However since then this was stopped. Since then he has had multpile ER visits for nasuea, vomiting, blood pressure. In October a CT showed gallstones. Apparently he was scheduled for an outpatient palliative care appointment for later this week.   The patient presents to the ER for nasua, vomiting, and anorexia for the last couple months. He has lost at least 100 lbs. BP was very low on arrival, improved mildly with IVF. Pulse was elevated and EKG showed afib RVR. Labs showed severe hypokalemia, lactic acidosis, and . CT abdomane showed emphysemous vs gangrenous cholecystitis. Patient was started on IV abx and IVF and admitted for further  work-up.    Past Medical History:  Diagnosis Date  . Arthritis   . CHF (congestive heart failure) ([place])   . Constipation   . Diabetes mellitus   . Heart murmur   . History of gout   . History of skin cancer   . Hypertension   . Morbid (severe) obesity due to excess calories ([place])   . OSA (obstructive sleep apnea)   . Psoriatic arthritis ([place])   . Rectal fissure   . Tinnitus     Past Surgical History:  Procedure Laterality Date  . addenoids    . ANAL FISTULOTOMY  2019   Dr Drue Flirt, [facility]  . IRRIGATION AND DEBRIDEMENT ABSCESS N/A 11/10/2019   Procedure: INCISION AND DRAINAGE OF HIGH SCROTAL ABSCESS;  Surgeon: Michael Boston, MD;  Location: [facility];  Service: General;  Laterality: N/A;  . JOINT REPLACEMENT  2012   RT KNEE  . LUNG BIOPSY     20 YRS AGO  . RECTAL EXAM UNDER ANESTHESIA N/A 11/10/2019   Procedure: ANORECTAL EXAM UNDER ANESTHESIA;  Surgeon: Michael Boston, MD;  Location: [facility];  Service: General;  Laterality: N/A;  . TONSILLECTOMY    . TOTAL KNEE ARTHROPLASTY Bilateral 06/22/2013   Procedure: LEFT TOTAL KNEE ARTHROPLASTY;  MEDIAL SOFT TISSUE EXPLORATION SAPHENOUS NEURECTOMY RIGHT KNEE;  Surgeon: Mauri Pole, MD;  Location: [facility];  Service: Orthopedics;  Laterality: Bilateral;     Home Medications:  Prior to Admission medications   Medication Sig Start Date End Date Taking? Authorizing Provider  Albuterol Sulfate (PROAIR RESPICLICK) 269 (  90 Base) MCG/ACT AEPB Inhale 2 puffs into the lungs every 6 (six) hours as needed (for wheezing or shortness of breath). 12/10/19  Yes Ngetich, Dinah C, NP  allopurinol (ZYLOPRIM) 300 MG tablet TAKE 1 TABLET(300 MG) BY MOUTH DAILY Patient taking differently: Take 300 mg by mouth daily. 12/10/19  Yes Ngetich, Dinah C, NP  atorvastatin (LIPITOR) 40 MG tablet TAKE 1 TABLET(40 MG) BY MOUTH EVERY EVENING 12/10/19  Yes Ngetich, Dinah C, NP  bisoprolol (ZEBETA) 5 MG tablet Take 0.5 tablets (2.5 mg total) by mouth daily. Patient taking  differently: Take 5 mg by mouth daily. 12/10/19 02/08/20 Yes Ngetich, Dinah C, NP  cetirizine (ZYRTEC) 10 MG tablet TAKE 1 TABLET(10 MG) BY MOUTH DAILY Patient taking differently: Take by mouth daily. 02/09/19  Yes [name], MD  Cholecalciferol (VITAMIN D) 2000 UNITS tablet Take 2,000 Units by mouth daily.   Yes [provider]  Cyanocobalamin (B-12) 2000 MCG TABS Take 2,000 mcg by mouth daily.   Yes [provider]  donepezil (ARICEPT) 10 MG tablet TAKE 1 TABLET(10 MG) BY MOUTH AT BEDTIME Patient taking differently: Take by mouth at bedtime. 5 mg at bedtime 12/10/19  Yes Ngetich, Dinah C, NP  fluocinonide cream (LIDEX) 5.00 % Apply 1 application topically 2 (two) times daily. To arms for psoriasis 12/10/19  Yes Ngetich, Dinah C, NP  fluticasone (FLONASE) 50 MCG/ACT nasal spray Place 2 sprays into both nostrils daily. 12/10/19  Yes Ngetich, Dinah C, NP  folic acid (FOLVITE) 1 MG tablet Take 1 mg by mouth daily. 04/29/12  Yes [provider]  furosemide (LASIX) 20 MG tablet TAKE 1 TABLET(20 MG) BY MOUTH DAILY Patient taking differently: Take by mouth daily. 12/10/19  Yes Ngetich, Dinah C, NP  JARDIANCE 10 MG TABS tablet TAKE 1 TABLET BY MOUTH DAILY BEFORE BREAKFAST Patient taking differently: Take 10 mg by mouth daily. 12/15/19  Yes [name], MD  meloxicam (MOBIC) 15 MG tablet Take 15 mg by mouth daily.   Yes [provider]  nystatin ointment (MYCOSTATIN) Apply 1 application topically daily. Apply to groin and perineal area 12/10/19  Yes Ngetich, Dinah C, NP  omeprazole (PRILOSEC) 40 MG capsule TAKE 1 CAPSULE BY MOUTH 30 TO 60 MINUTES BEFORE YOUR FIRST AND LAST MEAL OF THE DAY Patient taking differently: Take 40 mg by mouth daily. 12/10/19  Yes Ngetich, Dinah C, NP  pioglitazone (ACTOS) 30 MG tablet Take 30 mg by mouth daily. 01/01/20  Yes [provider]  potassium chloride (MICRO-K) 10 MEQ CR capsule TAKE 1 CAPSULE BY MOUTH EVERY  DAY Patient taking differently: Take 20 mEq by mouth daily. 12/10/19  Yes Ngetich, Dinah C, NP  promethazine (PHENERGAN) 25 MG tablet Take 1 tablet (25 mg total) by mouth every 8 (eight) hours as needed for nausea or vomiting. Alternate w/ Ondansetron (Zofran) 01/27/20  Yes [name], MD  psyllium (HYDROCIL/METAMUCIL) 95 % PACK Take 1 packet by mouth 2 (two) times daily. 11/15/19 02/13/20 Yes Kayleen Memos, DO  tamsulosin (FLOMAX) 0.4 MG CAPS capsule Take 0.4 mg by mouth daily.   Yes [provider]  tiZANidine (ZANAFLEX) 4 MG tablet Take 0.5-1 tablets (2-4 mg total) by mouth every 8 (eight) hours as needed for muscle spasms. Patient taking differently: Take 4 mg by mouth daily as needed for muscle spasms. spasms 12/28/19  Yes Brunetta Jeans, PA-C    Inpatient Medications: Scheduled Meds: . insulin aspart  0-15 Units Subcutaneous TID WC  . insulin  aspart  0-5 Units Subcutaneous QHS  . sodium chloride flush  3 mL Intravenous Q12H   Continuous Infusions: . ceFEPime (MAXIPIME) IV     And  . metronidazole    . diltiazem (CARDIZEM) infusion 15 mg/hr (01/31/20 1135)  . potassium chloride 10 mEq (01/31/20 1118)   PRN Meds: acetaminophen **OR** acetaminophen, morphine injection, ondansetron **OR** ondansetron (ZOFRAN) IV, promethazine  Allergies:    Allergies  Allergen Reactions  . Penicillins Hives    Patient reports full body hives that required medical treatment when he was in his 63s or 86s. He tolerated cephalosporins.   [name] [Pregabalin] Swelling  . Levofloxacin Swelling    Social History:   Social History   Socioeconomic History  . Marital status: Married    Spouse name: Not on file  . Number of children: 2  . Years of education: Not on file  . Highest education level: Not on file  Occupational History  . 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 Smoking status: Never Smoker  . Smokeless  tobacco: Never Used  Vaping Use  . Vaping Use: Never used  Substance and Sexual Activity  . Alcohol use: Yes    Alcohol/week: 0.0 standard drinks    Comment: Once a month  . Drug use: No  . Sexual activity: Never  Other Topics Concern  . Not on file  Social History Narrative   Alcohol- 1 to 2 per month.    Social Determinants of Health   Financial Resource Strain: Not on file  Food Insecurity: Not on file  Transportation Needs: Not on file  Physical Activity: Not on file  Stress: Not on file  Social Connections: Not on file  Intimate Partner Violence: Not on file    Family History:   Family History  Problem Relation Age of Onset  . Emphysema Father   . Clotting disorder Father   . Arthritis Father   . Heart disease Mother   . Arthritis Mother   . Arthritis Maternal Grandmother   . Hypertension Maternal Grandmother   . Diabetes Maternal Grandmother   . Arthritis Paternal Grandmother   . Diabetes Maternal Aunt   . Diabetes Paternal Aunt      ROS:  Please see the history of present illness.  All other ROS reviewed and negative.     Physical Exam/Data:   Vitals:   01/31/20 0400 01/31/20 0752 01/31/20 1130 01/31/20 1200  BP: 106/81 118/82 (!) 135/91 107/67  Pulse: (!) 103 (!) 130 (!) 134 (!) 128  Resp: 20 (!) 30 16 (!) 22  Temp:  97.8 F (36.6 C)  97.6 F (36.4 C)  TempSrc:  Oral  Oral  SpO2: 91% 99% 99% 91%  Weight:      Height:        Intake/Output Summary (Last 24 hours) at 01/31/2020 1216 Last data filed at 01/31/2020 0525 Gross per 24 hour  Intake 2031.39 ml  Output --  Net 2031.39 ml   Last 3 Weights 01/30/2020 01/30/2020 12/28/2019  Weight (lbs) 261 lb 0.4 oz 289 lb 14.5 oz 290 lb  Weight (kg) 118.4 kg 131.5 kg 131.543 kg     Body mass index is 36.41 kg/m.  General:  Well nourished, well developed, in no acute distress HEENT: normal Lymph: no adenopathy Neck: mild JVD Endocrine:  No thryomegaly Vascular: No carotid bruits; FA pulses 2+  bilaterally without bruits  Cardiac:  normal  S1, S2; RRR; no murmur  Lungs:  clear to auscultation bilaterally, no wheezing, rhonchi or rales  Abd: soft, nontender, no hepatomegaly  Ext: mild lower leg edema Musculoskeletal:  No deformities, BUE and BLE strength normal and equal Skin: warm and dry  Neuro:  CNs 2-12 intact, no focal abnormalities noted Psych:  Normal affect   EKG:  The EKG was personally reviewed and demonstrates:  Afib, 153bpm, LAD, nonspecific T wave changes Telemetry:  Telemetry was personally reviewed and demonstrates:  Afib HR 120-130s  Relevant CV Studies:  Echo 11/11/19 1. Left ventricular ejection fraction, by estimation, is 60 to 65%. The  left ventricle has normal function. The left ventricle has no regional  wall motion abnormalities. Left ventricular diastolic parameters are  consistent with Grade II diastolic  dysfunction (pseudonormalization).  2. Right ventricular systolic function is normal. The right ventricular  size is normal.  3. The mitral valve is normal in structure. No evidence of mitral valve  regurgitation. No evidence of mitral stenosis.  4. The aortic valve is normal in structure. Aortic valve regurgitation is  not visualized. No aortic stenosis is present.  5. The inferior vena cava is normal in size with greater than 50%  respiratory variability, suggesting right atrial pressure of 3 mmHg.   Myoview lexiscan 12/2017 Study Highlights   The left ventricular ejection fraction is normal (55-65%).  Nuclear stress EF: 59%.  There was no ST segment deviation noted during stress.  The study is normal.  This is a low risk study.    Laboratory Data:  High Sensitivity Troponin:   Recent Labs  Lab 01/30/20 1246 01/30/20 1640  TROPONINIHS 28* 29*     Chemistry Recent Labs  Lab 01/30/20 1246 01/31/20 0346  NA 137 138  K 2.7* 2.8*  CL 97* 101  CO2 21* 19*  GLUCOSE 186* 167*  BUN 13 13  CREATININE 1.19 0.97  CALCIUM  9.7 8.9  GFRNONAA >60 >60  ANIONGAP 19* 18*    Recent Labs  Lab 01/30/20 1246 01/31/20 0346  PROT 5.2* 4.3*  ALBUMIN 2.6* 2.2*  AST 13* 10*  ALT 11 10  ALKPHOS 71 61  BILITOT 3.0* 1.8*   Hematology Recent Labs  Lab 01/30/20 1246 01/31/20 0346  WBC 6.1 4.9  RBC 5.27 4.70  HGB 16.0 14.3  HCT 47.5 42.3  MCV 90.1 90.0  MCH 30.4 30.4  MCHC 33.7 33.8  RDW 15.9* 16.0*  PLT 139* 116*   BNPNo results for input(s): BNP, PROBNP in the last 168 hours.  DDimer No results for input(s): DDIMER in the last 168 hours.   Radiology/Studies:  CT ABDOMEN PELVIS WO CONTRAST  Result Date: 01/30/2020 CLINICAL DATA:  74 year old with lack of appetite.  Abdominal pain. EXAM: CT ABDOMEN AND PELVIS WITHOUT CONTRAST TECHNIQUE: Multidetector CT imaging of the abdomen and pelvis was performed following the standard protocol without IV contrast. COMPARISON:  11/22/2019 FINDINGS: Lower chest: Dependent densities at the lung bases. No significant pleural effusions. Small amount of pericardial fluid along the anterior aspect of the heart. Hepatobiliary: Evidence for gallstones with an air-fluid level in the gallbladder. No significant gallbladder distension. There may be minimal pericholecystic stranding. Normal appearance of the liver. No significant biliary dilatation. Pancreas: Unremarkable. No pancreatic ductal dilatation or surrounding inflammatory changes. Spleen: Normal in size without focal abnormality. Adrenals/Urinary Tract: Normal appearance of the adrenal glands. Bilateral renal cysts. Large cyst in the right kidney upper pole that measures up to 7.5 cm. Negative for hydronephrosis. Urinary  bladder is markedly distended. No evidence for urinary stones. Stomach/Bowel: Diffuse wall thickening involving the rectum. There is perirectal edema and a small amount of presacral fluid or edema. Normal appearance of the stomach. No evidence for bowel obstruction. Questionable wall thickening involving the right  colon on sequence 3 image 42. Vascular/Lymphatic: Atherosclerotic calcifications involving the abdominal aorta without aneurysm. No lymph node enlargement in the abdomen or pelvis. Reproductive: Prostate is unremarkable. Other: Fat containing inguinal hernias, left side greater than right. Perirectal stranding with some presacral edema. No significant ascites. Negative for free intraperitoneal air. Musculoskeletal: No acute bone abnormality. IMPRESSION: 1. Cholelithiasis with new air-fluid level in the gallbladder. Evidence for mild pericholecystic stranding. 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 Recommend surgical consultation. 2. Diffuse wall thickening involving the rectum with perirectal edema. Findings are suggestive for inflammation and proctitis. Underlying neoplastic process cannot be excluded and recommend GI consultation. Questionable wall thickening involving the right colon. 3. Distention of the urinary bladder without hydronephrosis. 4. Bilateral renal cysts. These results were called by telephone at the time of interpretation on 01/30/2020 at 2:25 pm to provider [facility] , who verbally acknowledged these results. Electronically Signed   By: Markus Daft M.D.   On: 01/30/2020 14:30   DG Chest Port 1 View  Result Date: 01/30/2020 CLINICAL DATA:  Dyspnea EXAM: PORTABLE CHEST 1 VIEW COMPARISON:  10/01/2018 chest radiograph. FINDINGS: Stable cardiomediastinal silhouette with mild cardiomegaly. No pneumothorax. Chronic mild bilateral pleural thickening without pleural effusions. No pulmonary edema. No acute consolidative airspace disease. Low lung volumes. IMPRESSION: 1. Stable mild cardiomegaly without pulmonary edema. 2. Low lung volumes with no active pulmonary disease. Electronically Signed   By: Ilona Sorrel M.D.   On: 01/30/2020 13:37     Assessment and Plan:   Pre-operative cardiac evaluation for treatment of cholcystitis - Presents with  several months of n/v, weight loss, anorexia. Imaging concerning for possible gangrenous bladder. General surgery following, he will likely need operation. On IV abx and pain medication - Patient has no prior history of CAD. Denies chest pain. He has chronic sob due to weakness and anorexia.  - H/o of afib previously started on Eliquis for CHASDVASC of 3. Echo at that time showed preserved EF. He had rectal bleeding and Eliquis was stopped. Patient was set up for Afib clinic but did not follow-up. Unsure since then if he has been in constant afib since then. Can consider limited echo to assess EF however this would not delay surgery.  - Now in afib RVR in the setting of sepsis/cholycistisis and severe hyperkalemia. Heart rates in the the 120s on IV dilt. He is also on Bisoprolol.  Pressures are stable. Unsure we want to try amio given he is not anticoagulated>>Will defer to MD.  - Also need to discuss anticoagulation post-operation given CHADSVASC of 3 (HTN, agex2, CHF) . Suspect it should be started if patient can tolerate it.  - Most recent echo in September showed preserved EF with G2DD. PTA lasix 20 mg daily. He has mild lower leg edema on exam however has had IVF and IV meds. He is not orthopnic. Can likely give lasix post-procedure as needed. - Patient lives with his wife. Prior to this (in August and September) patient was able to complete 4METS.  - According to the cardiac risk index assessment patient is at moderate risk, 6% 30 day risk of death, MI, or cardiac arrest. Likely okay to proceed with surgery. MD to see  For questions or updates, please contact [place] Please consult [email or web address] for contact info under    Signed, Iara Monds Ninfa Meeker, PA-C  01/31/2020 12:16 PM

## 2020-01-31 NOTE — Progress Notes (Signed)
PROGRESS NOTE    Gary Butler  UKG:[id number] DOB: 09-12-1945 DOA: 01/30/2020 PCP: [name], MD  Brief Narrative:HPI: Gary Butler is a 74/M chronically ill with history significant of OSA; psoriatic arthritis; morbid obesity (BMI 40); HTN; DM; BPH, chronic diastolic CHF (08/6281 echo with preserved EF and grade 2 diastolic dysfunction) presentedwith nausea and anorexia.  He was last admitted from 10/9-13 with hematochezia; prior admission from 9/27-10/5 with perirectal abscess s/p I&D.  During the first September hospitalization, he had new onset afib and was started on Eliquis.  Subsequent hospitalization was deemed related to recent perineal surgery site bleeding and Eliquis was resumed at hospital d/c.  He was discharged to [facility] for rehab and was discharged to home in late November.   He has recently been referred to palliative care as an outpatient.  -  He has had 3 recent ER visits to [facility] hospital most recently on 12/10.  He reported 8 months of anorexia, nausea and post-meal emesis.   -Progressively worsened, has been on ice chips for several weeks, lost about 100 pounds of weight  ED Course:  In the ED noted to have A. fib RVR, severe hypokalemia -CT abdomen pelvis concerning for emphysematous/gangrenous cholecystitis  Assessment & Plan:   Sepsis Gangrenous/emphysematous cholecystitis -General surgery consulting, recommended cardiology evaluation for clearance -Trial of clears -Resume cefepime/Flagyl, cut down IV fluids -Supportive care  Recent perirectal abscess  -With perirectal thickening on CT -Antibiotics as above, I wonder if this is unexpected in the setting of recent abscess and surgery  A. fib with RVR Chronic diastolic CHF -Continue Cardizem drip -Restart bisoprolol -Will hold heparin in the setting of gangrenous cholecystitis and risk of intra-abdominal bleeding -Requested cardiology eval for clearance per surgery  Type 2 diabetes  mellitus -Jardiance on hold, continue sliding scale insulin  Mild dementia/cognitive deficits  Morbid obesity Obstructive sleep apnea -Has lost 100 pounds and needs continued weight loss -Has not been using BiPAP/CPAP following weight loss  Goals of care DO NOT RESUSCITATE -Followed by palliative care, not under hospice  DVT prophylaxis: Start heparin subcu Code Status: DNR Family Communication: No family at bedside will update spouse Disposition Plan:  Status is: Inpatient  Remains inpatient appropriate because:Inpatient level of care appropriate due to severity of illness   Dispo: The patient is from: Home              Anticipated d/c is to: Home              Anticipated d/c date is: > 3 days              Patient currently is not medically stable to d/c.  Consultants:   General surgery   Procedures:   Antimicrobials:    Subjective: -Feels awful, nauseated, requesting ice chips  Objective: Vitals:   01/30/20 2328 01/31/20 0400 01/31/20 0752 01/31/20 1130  BP: 116/78 106/81 118/82 (!) 135/91  Pulse: 94 (!) 103 (!) 130 (!) 134  Resp: 19 20 (!) 30 16  Temp: 97.6 F (36.4 C)  97.8 F (36.6 C)   TempSrc: Axillary  Oral   SpO2: 96% 91% 99% 99%  Weight:      Height:        Intake/Output Summary (Last 24 hours) at 01/31/2020 1210 Last data filed at 01/31/2020 0525 Gross per 24 hour  Intake 2031.39 ml  Output --  Net 2031.39 ml   Filed Weights   01/30/20 1207 01/30/20 2107  Weight: 131.5  kg 118.4 kg    Examination:  General exam: Obese chronically ill elderly male lying in bed, awake alert oriented x2 CVS: S1-S2, irregularly irregular rhythm Lungs: Decreased breath sounds the bases otherwise  clear Abdomen: Obese, soft, mild right-sided tenderness, bowel sounds diminished Extremities: No edema Skin: No rashes on exposed skin  Psychiatry: Mood & affect appropriate.     Data Reviewed:   CBC: Recent Labs  Lab 01/30/20 1246 01/31/20 0346  WBC  6.1 4.9  HGB 16.0 14.3  HCT 47.5 42.3  MCV 90.1 90.0  PLT 139* 350*   Basic Metabolic Panel: Recent Labs  Lab 01/30/20 1246 01/31/20 0346  NA 137 138  K 2.7* 2.8*  CL 97* 101  CO2 21* 19*  GLUCOSE 186* 167*  BUN 13 13  CREATININE 1.19 0.97  CALCIUM 9.7 8.9  MG 1.5*  --    GFR: Estimated Creatinine Clearance: 86.1 mL/min (by C-G formula based on SCr of 0.97 mg/dL). Liver Function Tests: Recent Labs  Lab 01/30/20 1246 01/31/20 0346  AST 13* 10*  ALT 11 10  ALKPHOS 71 61  BILITOT 3.0* 1.8*  PROT 5.2* 4.3*  ALBUMIN 2.6* 2.2*   Recent Labs  Lab 01/30/20 1246  LIPASE 48   No results for input(s): AMMONIA in the last 168 hours. Coagulation Profile: Recent Labs  Lab 01/30/20 1246  INR 1.6*   Cardiac Enzymes: No results for input(s): CKTOTAL, CKMB, CKMBINDEX, TROPONINI in the last 168 hours. BNP (last 3 results) No results for input(s): PROBNP in the last 8760 hours. HbA1C: Recent Labs    01/30/20 1246  HGBA1C 6.5*   CBG: Recent Labs  Lab 01/30/20 1801 01/30/20 2148 01/31/20 0616 01/31/20 0750  GLUCAP 140* 164* 144* 146*   Lipid Profile: No results for input(s): CHOL, HDL, LDLCALC, TRIG, CHOLHDL, LDLDIRECT in the last 72 hours. Thyroid Function Tests: Recent Labs    01/30/20 1246  TSH 1.121   Anemia Panel: No results for input(s): VITAMINB12, FOLATE, FERRITIN, TIBC, IRON, RETICCTPCT in the last 72 hours. Urine analysis:    Component Value Date/Time   COLORURINE YELLOW 11/09/2019 1937   APPEARANCEUR CLEAR 11/09/2019 1937   LABSPEC <1.005 (L) 11/09/2019 1937   PHURINE 5.5 11/09/2019 1937   GLUCOSEU >=500 (A) 11/09/2019 1937   HGBUR NEGATIVE 11/09/2019 1937   BILIRUBINUR NEGATIVE 11/09/2019 1937   KETONESUR NEGATIVE 11/09/2019 1937   PROTEINUR NEGATIVE 11/09/2019 1937   UROBILINOGEN 1.0 06/10/2013 1000   NITRITE NEGATIVE 11/09/2019 1937   LEUKOCYTESUR NEGATIVE 11/09/2019 1937   Sepsis  Labs: @LABRCNTIP (procalcitonin:4,lacticidven:4)  ) Recent Results (from the past 240 hour(s))  Resp Panel by RT-PCR (Flu A&B, Covid) Nasopharyngeal Swab     Status: None   Collection Time: 01/30/20 12:38 PM   Specimen: Nasopharyngeal Swab; Nasopharyngeal(NP) swabs in vial transport medium  Result Value Ref Range Status   SARS Coronavirus 2 by RT PCR NEGATIVE NEGATIVE Final    Comment: (NOTE) SARS-CoV-2 target nucleic acids are NOT DETECTED.  The SARS-CoV-2 RNA is generally detectable in upper respiratory specimens during the acute phase of infection. The lowest concentration of SARS-CoV-2 viral copies this assay can detect is 138 copies/mL. A negative result does not preclude SARS-Cov-2 infection and should not be used as the sole basis for treatment or other patient management decisions. A negative result may occur with  improper specimen collection/handling, submission of specimen other than nasopharyngeal swab, presence of viral mutation(s) within the areas targeted by this assay, and inadequate number of viral copies(<138 copies/mL). A  negative result must be combined with clinical observations, patient history, and epidemiological information. The expected result is Negative.  Fact Sheet for Patients:  EntrepreneurPulse.com.au  Fact Sheet for Healthcare Providers:  IncredibleEmployment.be  This test is no t yet approved or cleared by the [place] FDA and  has been authorized for detection and/or diagnosis of SARS-CoV-2 by FDA under an Emergency Use Authorization (EUA). This EUA will remain  in effect (meaning this test can be used) for the duration of the COVID-19 declaration under Section 564(b)(1) of the Act, 21 U.S.C.section 360bbb-3(b)(1), unless the authorization is terminated  or revoked sooner.       Influenza A by PCR NEGATIVE NEGATIVE Final   Influenza B by PCR NEGATIVE NEGATIVE Final    Comment: (NOTE) The Xpert  Xpress SARS-CoV-2/FLU/RSV plus assay is intended as an aid in the diagnosis of influenza from Nasopharyngeal swab specimens and should not be used as a sole basis for treatment. Nasal washings and aspirates are unacceptable for Xpert Xpress SARS-CoV-2/FLU/RSV testing.  Fact Sheet for Patients: EntrepreneurPulse.com.au  Fact Sheet for Healthcare Providers: IncredibleEmployment.be  This test is not yet approved or cleared by the [place] FDA and has been authorized for detection and/or diagnosis of SARS-CoV-2 by FDA under an Emergency Use Authorization (EUA). This EUA will remain in effect (meaning this test can be used) for the duration of the COVID-19 declaration under Section 564(b)(1) of the Act, 21 U.S.C. section 360bbb-3(b)(1), unless the authorization is terminated or revoked.  Performed at [facility], [place] [address], [place], [place] [address]   Culture, blood (routine x 2)     Status: None (Preliminary result)   Collection Time: 01/30/20  4:40 PM   Specimen: BLOOD RIGHT FOREARM  Result Value Ref Range Status   Specimen Description BLOOD RIGHT FOREARM  Final   Special Requests   Final    BOTTLES DRAWN AEROBIC AND ANAEROBIC Blood Culture results may not be optimal due to an inadequate volume of blood received in culture bottles   Culture   Final    NO GROWTH < 24 HOURS Performed at [facility], [place] [address] [place], [place] [address]    Report Status PENDING  Incomplete  Culture, blood (routine x 2)     Status: None (Preliminary result)   Collection Time: 01/30/20  9:54 PM   Specimen: BLOOD  Result Value Ref Range Status   Specimen Description BLOOD LEFT HAND  Final   Special Requests   Final    BOTTLES DRAWN AEROBIC ONLY Blood Culture adequate volume   Culture   Final    NO GROWTH < 24 HOURS Performed at [facility], [place] [address], [place], [place] [address]    Report Status PENDING  Incomplete   MRSA PCR Screening     Status: None   Collection Time: 01/31/20  7:34 AM   Specimen: Nasal Mucosa; Nasopharyngeal  Result Value Ref Range Status   MRSA by PCR NEGATIVE NEGATIVE Final    Comment:        The GeneXpert MRSA Assay (FDA approved for NASAL specimens only), is one component of a comprehensive MRSA colonization surveillance program. It is not intended to diagnose MRSA infection nor to guide or monitor treatment for MRSA infections. Performed at [facility], [place] [address] [place], [place] [address]          Radiology Studies: CT ABDOMEN PELVIS WO CONTRAST  Result Date: 01/30/2020 CLINICAL DATA:  74 year old  with lack of appetite.  Abdominal pain. EXAM: CT ABDOMEN AND PELVIS WITHOUT CONTRAST TECHNIQUE: Multidetector CT imaging of the abdomen and pelvis was performed following the standard protocol without IV contrast. COMPARISON:  11/22/2019 FINDINGS: Lower chest: Dependent densities at the lung bases. No significant pleural effusions. Small amount of pericardial fluid along the anterior aspect of the heart. Hepatobiliary: Evidence for gallstones with an air-fluid level in the gallbladder. No significant gallbladder distension. There may be minimal pericholecystic stranding. Normal appearance of the liver. No significant biliary dilatation. Pancreas: Unremarkable. No pancreatic ductal dilatation or surrounding inflammatory changes. Spleen: Normal in size without focal abnormality. Adrenals/Urinary Tract: Normal appearance of the adrenal glands. Bilateral renal cysts. Large cyst in the right kidney upper pole that measures up to 7.5 cm. Negative for hydronephrosis. Urinary bladder is markedly distended. No evidence for urinary stones. Stomach/Bowel: Diffuse wall thickening involving the rectum. There is perirectal edema and a small amount of presacral fluid or edema. Normal appearance of the stomach. No evidence for bowel obstruction. Questionable wall thickening involving  the right colon on sequence 3 image 42. Vascular/Lymphatic: Atherosclerotic calcifications involving the abdominal aorta without aneurysm. No lymph node enlargement in the abdomen or pelvis. Reproductive: Prostate is unremarkable. Other: Fat containing inguinal hernias, left side greater than right. Perirectal stranding with some presacral edema. No significant ascites. Negative for free intraperitoneal air. Musculoskeletal: No acute bone abnormality. IMPRESSION: 1. Cholelithiasis with new air-fluid level in the gallbladder. Evidence for mild pericholecystic stranding. 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 Recommend surgical consultation. 2. Diffuse wall thickening involving the rectum with perirectal edema. Findings are suggestive for inflammation and proctitis. Underlying neoplastic process cannot be excluded and recommend GI consultation. Questionable wall thickening involving the right colon. 3. Distention of the urinary bladder without hydronephrosis. 4. Bilateral renal cysts. These results were called by telephone at the time of interpretation on 01/30/2020 at 2:25 pm to provider [facility] , who verbally acknowledged these results. Electronically Signed   By: Markus Daft M.D.   On: 01/30/2020 14:30   DG Chest Port 1 View  Result Date: 01/30/2020 CLINICAL DATA:  Dyspnea EXAM: PORTABLE CHEST 1 VIEW COMPARISON:  10/01/2018 chest radiograph. FINDINGS: Stable cardiomediastinal silhouette with mild cardiomegaly. No pneumothorax. Chronic mild bilateral pleural thickening without pleural effusions. No pulmonary edema. No acute consolidative airspace disease. Low lung volumes. IMPRESSION: 1. Stable mild cardiomegaly without pulmonary edema. 2. Low lung volumes with no active pulmonary disease. Electronically Signed   By: Ilona Sorrel M.D.   On: 01/30/2020 13:37        Scheduled Meds: . insulin aspart  0-15 Units Subcutaneous TID WC  . insulin aspart  0-5  Units Subcutaneous QHS  . sodium chloride flush  3 mL Intravenous Q12H   Continuous Infusions: . ceFEPime (MAXIPIME) IV     And  . metronidazole    . diltiazem (CARDIZEM) infusion 15 mg/hr (01/31/20 1135)  . potassium chloride 10 mEq (01/31/20 1118)     LOS: 1 day    Time spent: 70min  Domenic Polite, MD [facility]  01/31/2020, 12:10 PM

## 2020-01-31 NOTE — Progress Notes (Signed)
Subjective On IV heparin and undergoing ACS workup; afib+rvr currently  Objective: Vital signs in last 24 hours: Temp:  [97.6 F (36.4 C)-99.4 F (37.4 C)] 97.8 F (36.6 C) (12/19 0752) Pulse Rate:  [37-143] 130 (12/19 0752) Resp:  [0-30] 30 (12/19 0752) BP: (93-136)/(70-95) 118/82 (12/19 0752) SpO2:  [90 %-100 %] 99 % (12/19 0752) Weight:  [118.4 kg-131.5 kg] 118.4 kg (12/18 2107)    Intake/Output from previous day: 12/18 0701 - 12/19 0700 In: 2031.4 [I.V.:663.2; IV Piggyback:1368.2] Out: -  Intake/Output this shift: No intake/output data recorded.  Gen: NAD, comfortable CV: RRR Pulm: Normal work of breathing Abd: Soft, Not significantly tender, mild distention Ext: SCDs in place  Lab Results: CBC  Recent Labs    01/30/20 1246 01/31/20 0346  WBC 6.1 4.9  HGB 16.0 14.3  HCT 47.5 42.3  PLT 139* 116*   BMET Recent Labs    01/30/20 1246 01/31/20 0346  NA 137 138  K 2.7* 2.8*  CL 97* 101  CO2 21* 19*  GLUCOSE 186* 167*  BUN 13 13  CREATININE 1.19 0.97  CALCIUM 9.7 8.9   PT/INR Recent Labs    01/30/20 1246  LABPROT 18.8*  INR 1.6*   ABG No results for input(s): PHART, HCO3 in the last 72 hours.  Invalid input(s): PCO2, PO2  Studies/Results:  Anti-infectives: Anti-infectives (From admission, onward)   Start     Dose/Rate Route Frequency Ordered Stop   01/30/20 1445  ceFEPIme (MAXIPIME) 2 g in sodium chloride 0.9 % 100 mL IVPB       "And" Linked Group Details   2 g 200 mL/hr over 30 Minutes Intravenous  Once 01/30/20 1438 01/30/20 1856   01/30/20 1445  metroNIDAZOLE (FLAGYL) IVPB 500 mg       "And" Linked Group Details   500 mg 100 mL/hr over 60 Minutes Intravenous  Once 01/30/20 1438 01/30/20 1856       Assessment/Plan: Patient Active Problem List   Diagnosis Date Noted  . Acute emphysematous cholecystitis 01/30/2020  . Sepsis due to undetermined organism ([place]) 01/30/2020  . Chronic diastolic congestive heart failure ( [place]) 11/29/2019   . Bilateral primary osteoarthritis of knee 11/23/2019  . Hypercoagulable state due to atrial fibrillation ([place]) 11/23/2019  . Physical deconditioning 11/23/2019  . Chronic gout of multiple sites 11/23/2019  . Hematochezia 11/22/2019  . Lower GI bleed 11/22/2019  . Diverticulosis   . Acute urinary retention s/p Foley 11/10/2019 11/10/2019  . Suprasphincteric perirectal abscess s/p I&D 11/10/2019 11/10/2019  . Diarrhea 11/10/2019  . Perineal abscess 11/10/2019  . GERD without esophagitis 11/10/2019  . Atrial fibrillation ([place]) 11/10/2019  . Poorly controlled diabetes mellitus ([place]) 11/09/2019  . Dyspnea on exertion 02/19/2017  . Bilateral lower extremity edema 07/06/2015  . CHF (congestive heart failure) ([place]) 06/27/2015  . Fungal dermatitis 06/27/2015  . Edema 06/20/2015  . Increasing shortness of breath 06/20/2015  . Lumbar radiculopathy, acute 03/09/2015  . Essential hypertension 12/27/2014  . Uncontrolled type 2 diabetes mellitus with hyperglycemia, with long-term current use of insulin ([place]) 12/27/2014  . Mild aortic stenosis 06/28/2014  . Memory loss 04/29/2014  . Tinnitus of both ears 02/17/2014  . Wellness examination 12/04/2013  . Constipation due to slow transit 08/18/2013  . S/P left TKA 06/22/2013  . Meniere disease 04/21/2013  . Benign paroxysmal positional vertigo 04/21/2013  . History of methicillin resistant staphylococcus aureus (MRSA) 2015  . Psoriatic arthritis ([place]) 05/14/2012  . General medical exam 01/28/2012  . Cellulitis of eyelid  09/25/2011  . Diverticulitis 06/20/2011  . Erythrocytosis 05/18/2011  . S/P TKR (total knee replacement) 12/31/2010  . Obesity, Class III, BMI 40-49.9 (morbid obesity) ([place]) 12/31/2010  . Hyperlipidemia 12/22/2010  . BPH (benign prostatic hyperplasia) 12/22/2010  . Gout 12/22/2010  . OSA on CPAP 04/04/2009   -Given changes in events and IV heparin now, not planning surgery today; will need cardiology to weigh in for cardiac  clearance reasons -Cont IV abx -We will follow with you - noted plans for hospice prior to this event. Would have palliative care weigh in on goals of care discussions. If clinical deterioration option for percutaneous cholecystostomy tube remains an option    LOS: 1 day   Sharon Mt. Dema Severin, M.D. [facility] Use [email or web address] to contact on call provider

## 2020-01-31 NOTE — Progress Notes (Addendum)
Mobility Specialist - Progress Note   01/31/20 1229  Mobility  Activity Contraindicated/medical hold   Pt's resting HR currently ~144, holding mobility for now.   Pricilla Handler Mobility Specialist Mobility Specialist Phone: [phone]

## 2020-01-31 NOTE — Progress Notes (Signed)
Pharmacy Antibiotic Note  Gary Butler is a 74 y.o. male admitted on 01/30/2020 with severe gangrenous cholecystits.  Pharmacy has been consulted for cefepime and flagyl dosing.  Of note, antibiotics were not reordered after one time doses in the ED 12/18. I notified MD and reordered after it was noted in daily review. CrCL >60 ml/min. Afebrile and normal wbc.  Plan: cefepime 2g q8hr IV, flagyl 500mg  q8hr  Height: 5\' 11"  (180.3 cm) Weight: 118.4 kg (261 lb 0.4 oz) IBW/kg (Calculated) : 75.3  Temp (24hrs), Avg:98.2 F (36.8 C), Min:97.6 F (36.4 C), Max:99.4 F (37.4 C)  Recent Labs  Lab 01/30/20 1246 01/30/20 1338 01/30/20 1640 01/31/20 0346  WBC 6.1  --   --  4.9  CREATININE 1.19  --   --  0.97  LATICACIDVEN  --  2.6* 3.3*  --     Estimated Creatinine Clearance: 86.1 mL/min (by C-G formula based on SCr of 0.97 mg/dL).    Allergies  Allergen Reactions  . Penicillins Hives    Patient reports full body hives that required medical treatment when he was in his 73s or 6s. He tolerated cephalosporins.   [name] [Pregabalin] Swelling  . Levofloxacin Swelling    Antimicrobials this admission: 12/18 flagyl >> 12/18 cefepime >>  Dose adjustments this admission: None to date  Microbiology results: 12/19 BCx: pend  Thank you for allowing pharmacy to be a part of this patient's care.  Norina Buzzard, PharmD PGY1 Pharmacy Resident 01/31/2020 12:15 PM

## 2020-01-31 NOTE — Progress Notes (Signed)
Pt refusing CPAP for the night.  

## 2020-02-01 ENCOUNTER — Ambulatory Visit: Payer: Medicare PPO | Admitting: Physician Assistant

## 2020-02-01 ENCOUNTER — Inpatient Hospital Stay (HOSPITAL_COMMUNITY): Payer: Medicare PPO

## 2020-02-01 ENCOUNTER — Other Ambulatory Visit (HOSPITAL_COMMUNITY): Payer: Medicare PPO

## 2020-02-01 LAB — COMPREHENSIVE METABOLIC PANEL
ALT: 9 U/L (ref 0–44)
AST: 11 U/L — ABNORMAL LOW (ref 15–41)
Albumin: 2.1 g/dL — ABNORMAL LOW (ref 3.5–5.0)
Alkaline Phosphatase: 58 U/L (ref 38–126)
Anion gap: 13 (ref 5–15)
BUN: 11 mg/dL (ref 8–23)
CO2: 19 mmol/L — ABNORMAL LOW (ref 22–32)
Calcium: 8.5 mg/dL — ABNORMAL LOW (ref 8.9–10.3)
Chloride: 105 mmol/L (ref 98–111)
Creatinine, Ser: 0.9 mg/dL (ref 0.61–1.24)
GFR, Estimated: >60 mL/min (ref >60)
Glucose, Bld: 131 mg/dL — ABNORMAL HIGH (ref 70–99)
Potassium: 3.2 mmol/L — ABNORMAL LOW (ref 3.5–5.1)
Sodium: 137 mmol/L (ref 135–145)
Total Bilirubin: 1.6 mg/dL — ABNORMAL HIGH (ref 0.3–1.2)
Total Protein: 4.3 g/dL — ABNORMAL LOW (ref 6.5–8.1)

## 2020-02-01 LAB — CBC
HCT: 40.4 % (ref 39.0–52.0)
Hemoglobin: 13.6 g/dL (ref 13.0–17.0)
MCH: 30.5 pg (ref 26.0–34.0)
MCHC: 33.7 g/dL (ref 30.0–36.0)
MCV: 90.6 fL (ref 80.0–100.0)
Platelets: 111 10*3/uL — ABNORMAL LOW (ref 150–400)
RBC: 4.46 MIL/uL (ref 4.22–5.81)
RDW: 15.9 % — ABNORMAL HIGH (ref 11.5–15.5)
WBC: 4.5 10*3/uL (ref 4.0–10.5)
nRBC: 0 % (ref 0.0–0.2)

## 2020-02-01 LAB — GLUCOSE, CAPILLARY
Glucose-Capillary: 142 mg/dL — ABNORMAL HIGH (ref 70–99)
Glucose-Capillary: 147 mg/dL — ABNORMAL HIGH (ref 70–99)
Glucose-Capillary: 132 mg/dL — ABNORMAL HIGH (ref 70–99)
Glucose-Capillary: 121 mg/dL — ABNORMAL HIGH (ref 70–99)

## 2020-02-01 MED ORDER — TECHNETIUM TC 99M MEBROFENIN IV KIT
4.9100 | PACK | Freq: Once | INTRAVENOUS | Status: AC | PRN
  Administered 2020-02-01: 4.91 via INTRAVENOUS

## 2020-02-01 MED ORDER — POTASSIUM CHLORIDE 10 MEQ/100ML IV SOLN
INTRAVENOUS | Status: AC
  Administered 2020-02-01: 10 meq
  Filled 2020-02-01: qty 100

## 2020-02-01 MED ORDER — POTASSIUM CHLORIDE 10 MEQ/100ML IV SOLN
10.0000 meq | INTRAVENOUS | Status: AC
  Administered 2020-02-01 (×3): 10 meq via INTRAVENOUS
  Filled 2020-02-01 (×3): qty 100

## 2020-02-01 NOTE — Progress Notes (Signed)
Pt doesn't want to wear CPAP for the night. 

## 2020-02-01 NOTE — Progress Notes (Signed)
Mobility Specialist: Progress Note   02/01/20 1313  Mobility  Activity Refused mobility   Pt refused mobility due to pain level. Pt requesting information regarding his surgery, RN notified.   [facility] Conrado Nance Mobility Specialist

## 2020-02-01 NOTE — [place] Notes (Signed)
Subjective/Chief Complaint: Pt with con't lower and RUQ pain NPO   Objective: Vital signs in last 24 hours: Temp:  [97.6 F (36.4 C)-97.8 F (36.6 C)] 97.8 F (36.6 C) (12/20 0748) Pulse Rate:  [68-139] 105 (12/20 0748) Resp:  [14-22] 17 (12/20 0748) BP: (84-135)/(60-91) 93/73 (12/20 0748) SpO2:  [88 %-100 %] 97 % (12/20 0748) Last BM Date:  (unsure of date, few weeks ago)  Intake/Output from previous day: 12/19 0701 - 12/20 0700 In: -  Out: 200 [Urine:200] Intake/Output this shift: No intake/output data recorded.  PE:  Constitutional: No acute distress, conversant, appears states age. Eyes: Anicteric sclerae, moist conjunctiva, no lid lag Lungs: Clear to auscultation bilaterally, normal respiratory effort CV: regular rate and rhythm, no murmurs, no peripheral edema, pedal pulses 2+ GI: Soft, no masses or hepatosplenomegaly, -tender to palpation RUQ and LLQ/RLQ Skin: No rashes, palpation reveals normal turgor Psychiatric: appropriate judgment and insight, oriented to person, place, and time   Lab Results:  Recent Labs    01/31/20 0346 02/01/20 0635  WBC 4.9 4.5  HGB 14.3 13.6  HCT 42.3 40.4  PLT 116* 111*   BMET Recent Labs    01/31/20 0346 02/01/20 0635  NA 138 137  K 2.8* 3.2*  CL 101 105  CO2 19* 19*  GLUCOSE 167* 131*  BUN 13 11  CREATININE 0.97 0.90  CALCIUM 8.9 8.5*   PT/INR Recent Labs    01/30/20 1246  LABPROT 18.8*  INR 1.6*   Studies/Results: CT ABDOMEN PELVIS WO CONTRAST  Result Date: 01/30/2020 CLINICAL DATA:  74 year old with lack of appetite.  Abdominal pain. EXAM: CT ABDOMEN AND PELVIS WITHOUT CONTRAST TECHNIQUE: Multidetector CT imaging of the abdomen and pelvis was performed following the standard protocol without IV contrast. COMPARISON:  11/22/2019 FINDINGS: Lower chest: Dependent densities at the lung bases. No significant pleural effusions. Small amount of pericardial fluid along the anterior aspect of the heart.  Hepatobiliary: Evidence for gallstones with an air-fluid level in the gallbladder. No significant gallbladder distension. There may be minimal pericholecystic stranding. Normal appearance of the liver. No significant biliary dilatation. Pancreas: Unremarkable. No pancreatic ductal dilatation or surrounding inflammatory changes. Spleen: Normal in size without focal abnormality. Adrenals/Urinary Tract: Normal appearance of the adrenal glands. Bilateral renal cysts. Large cyst in the right kidney upper pole that measures up to 7.5 cm. Negative for hydronephrosis. Urinary bladder is markedly distended. No evidence for urinary stones. Stomach/Bowel: Diffuse wall thickening involving the rectum. There is perirectal edema and a small amount of presacral fluid or edema. Normal appearance of the stomach. No evidence for bowel obstruction. Questionable wall thickening involving the right colon on sequence 3 image 42. Vascular/Lymphatic: Atherosclerotic calcifications involving the abdominal aorta without aneurysm. No lymph node enlargement in the abdomen or pelvis. Reproductive: Prostate is unremarkable. Other: Fat containing inguinal hernias, left side greater than right. Perirectal stranding with some presacral edema. No significant ascites. Negative for free intraperitoneal air. Musculoskeletal: No acute bone abnormality. IMPRESSION: 1. Cholelithiasis with new air-fluid level in the gallbladder. Evidence for mild pericholecystic stranding. 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 Recommend surgical consultation. 2. Diffuse wall thickening involving the rectum with perirectal edema. Findings are suggestive for inflammation and proctitis. Underlying neoplastic process cannot be excluded and recommend GI consultation. Questionable wall thickening involving the right colon. 3. Distention of the urinary bladder without hydronephrosis. 4. Bilateral renal cysts. These results were called  by telephone at the time of interpretation on 01/30/2020 at  2:25 pm to provider JULIE HAVILAND , who verbally acknowledged these results. Electronically Signed   By: Markus Daft M.D.   On: 01/30/2020 14:30   DG Chest Port 1 View  Result Date: 01/30/2020 CLINICAL DATA:  Dyspnea EXAM: PORTABLE CHEST 1 VIEW COMPARISON:  10/01/2018 chest radiograph. FINDINGS: Stable cardiomediastinal silhouette with mild cardiomegaly. No pneumothorax. Chronic mild bilateral pleural thickening without pleural effusions. No pulmonary edema. No acute consolidative airspace disease. Low lung volumes. IMPRESSION: 1. Stable mild cardiomegaly without pulmonary edema. 2. Low lung volumes with no active pulmonary disease. Electronically Signed   By: Ilona Sorrel M.D.   On: 01/30/2020 13:37    Anti-infectives: Anti-infectives (From admission, onward)   Start     Dose/Rate Route Frequency Ordered Stop   01/31/20 1400  ceFEPIme (MAXIPIME) 2 g in sodium chloride 0.9 % 100 mL IVPB       "And" Linked Group Details   2 g 200 mL/hr over 30 Minutes Intravenous Every 8 hours 01/31/20 1158     01/31/20 1245  metroNIDAZOLE (FLAGYL) IVPB 500 mg       "And" Linked Group Details   500 mg 100 mL/hr over 60 Minutes Intravenous Every 8 hours 01/31/20 1158     01/30/20 1445  ceFEPIme (MAXIPIME) 2 g in sodium chloride 0.9 % 100 mL IVPB       "And" Linked Group Details   2 g 200 mL/hr over 30 Minutes Intravenous  Once 01/30/20 1438 01/30/20 1856   01/30/20 1445  metroNIDAZOLE (FLAGYL) IVPB 500 mg       "And" Linked Group Details   500 mg 100 mL/hr over 60 Minutes Intravenous  Once 01/30/20 1438 01/30/20 1856      Assessment/Plan:  Patient Active Problem List   Diagnosis Date Noted  . Acute emphysematous cholecystitis 01/30/2020  . Sepsis due to undetermined organism ([place]) 01/30/2020  . Chronic diastolic congestive heart failure ([place]) 11/29/2019  . Bilateral primary osteoarthritis of knee 11/23/2019  . Hypercoagulable state due to  atrial fibrillation ([place]) 11/23/2019  . Physical deconditioning 11/23/2019  . Chronic gout of multiple sites 11/23/2019  . Hematochezia 11/22/2019  . Lower GI bleed 11/22/2019  . Diverticulosis   . Acute urinary retention s/p Foley 11/10/2019 11/10/2019  . Suprasphincteric perirectal abscess s/p I&D 11/10/2019 11/10/2019  . Diarrhea 11/10/2019  . Perineal abscess 11/10/2019  . GERD without esophagitis 11/10/2019  . Atrial fibrillation ([place]) 11/10/2019  . Poorly controlled diabetes mellitus ([place]) 11/09/2019  . Dyspnea on exertion 02/19/2017  . Bilateral lower extremity edema 07/06/2015  . CHF (congestive heart failure) ([place]) 06/27/2015  . Fungal dermatitis 06/27/2015  . Edema 06/20/2015  . Increasing shortness of breath 06/20/2015  . Lumbar radiculopathy, acute 03/09/2015  . Essential hypertension 12/27/2014  . Uncontrolled type 2 diabetes mellitus with hyperglycemia, with long-term current use of insulin ([place]) 12/27/2014  . Mild aortic stenosis 06/28/2014  . Memory loss 04/29/2014  . Tinnitus of both ears 02/17/2014  . Wellness examination 12/04/2013  . Constipation due to slow transit 08/18/2013  . S/P left TKA 06/22/2013  . Meniere disease 04/21/2013  . Benign paroxysmal positional vertigo 04/21/2013  . History of methicillin resistant staphylococcus aureus (MRSA) 2015  . Psoriatic arthritis ([place]) 05/14/2012  . General medical exam 01/28/2012  . Cellulitis of eyelid 09/25/2011  . Diverticulitis 06/20/2011  . Erythrocytosis 05/18/2011  . S/P TKR (total knee replacement) 12/31/2010  . Obesity, Class III, BMI 40-49.9 (morbid obesity) ([place]) 12/31/2010  . Hyperlipidemia 12/22/2010  . BPH (benign  prostatic hyperplasia) 12/22/2010  . Gout 12/22/2010  . OSA on CPAP 04/04/2009   81M with ?chronic cholecystitis vs. Sx gallstones -ACS w//u neg, heparin off -Pt with chronic pain, doesn't seem to correlate with gb issues.  Also with normal LFTS and decreasing TB -Will order HIDA  scan  LOS: 2 days    Ralene Ok 02/01/2020

## 2020-02-01 NOTE — [place] Notes (Addendum)
PROGRESS NOTE    Gary Butler  LOV:[id number] DOB: [birth year] DOA: 01/30/2020 PCP: [name], MD  Brief Narrative:HPI: Gary Butler is a 74/M chronically ill with history significant of OSA; psoriatic arthritis; morbid obesity (BMI 40); HTN; DM; BPH, chronic diastolic CHF (09/8414 echo with preserved EF and grade 2 diastolic dysfunction) presentedwith nausea and anorexia.   -prior admission from 9/27-10/5 with perirectal abscess s/p I&D. he also had new onset afib and was started on Eliquis. --Last admitted from 10/9-13 with hematochezia; this was felt to be related to recent perineal surgery site bleeding and Eliquis was resumed at hospital d/c.  He was discharged to [facility] for rehab and was discharged to home in late November.   He has recently been referred to palliative care as an outpatient.  - He has had 3 recent ER visits to [facility] hospital most recently on 12/10.  He reported 8 months of anorexia, nausea and post-meal emesis.   -Progressively worsened, has been on ice chips for several weeks, lost about 100 pounds of weight  ED Course:  In the ED noted to have A. fib RVR, severe hypokalemia -CT abdomen pelvis concerning for emphysematous/gangrenous cholecystitis -Seen by general surgery and cardiology for clearance  Assessment & Plan:   Sepsis Gangrenous/emphysematous cholecystitis -General surgery following, seen by cardiology for clearance, recommended not to delay surgery -Continue IV cefepime/Flagyl  -Awaiting surgical plans  Recent perirectal abscess  -With perirectal thickening on CT -Antibiotics as above, I wonder if this is unexpected in the setting of recent abscess and surgery  A. fib with RVR Chronic diastolic CHF -Continue Cardizem drip -Continue bisoprolol as tolerated -Will hold heparin in the setting of gangrenous cholecystitis and risk of intra-abdominal bleeding  Type 2 diabetes mellitus -Jardiance on hold, continue sliding scale  insulin  Mild dementia/cognitive deficits  Morbid obesity Obstructive sleep apnea -Has lost 100 pounds and needs continued weight loss -Has not been using BiPAP/CPAP following weight loss  Goals of care DO NOT RESUSCITATE -Followed by palliative care, not under hospice  DVT prophylaxis: Heparin subcu Code Status: DNR Family Communication: No family at bedside will update spouse Disposition Plan:  Status is: Inpatient  Remains inpatient appropriate because:Inpatient level of care appropriate due to severity of illness   Dispo: The patient is from: Home              Anticipated d/c is to: May need rehab              Anticipated d/c date is: > 3 days              Patient currently is not medically stable to d/c.  Consultants:   General surgery   Procedures:   Antimicrobials:    Subjective: -Continues to feel awful, nauseated, requesting water, ice chips  Objective: Vitals:   02/01/20 0854 02/01/20 0900 02/01/20 1014 02/01/20 1111  BP:  98/65  102/65  Pulse: (!) 103 95 (!) 112 97  Resp: 18 17 20 16   Temp:    97.8 F (36.6 C)  TempSrc:    Oral  SpO2: 90% 93% 99% 99%  Weight:      Height:        Intake/Output Summary (Last 24 hours) at 02/01/2020 1318 Last data filed at 01/31/2020 1900 Gross per 24 hour  Intake --  Output 200 ml  Net -200 ml   Filed Weights   01/30/20 1207 01/30/20 2107  Weight: 131.5 kg 118.4 kg  Examination:  General exam: Obese chronically ill elderly male laying in bed, awake alert oriented x3 CVS: S1-S2, irregularly irregular rhythm Lungs: Decreased breath sounds to bases, otherwise clear Abdomen: Obese, soft, nontender, bowel sounds diminished Extremities: No edema Skin: No rashes on exposed skin  Psychiatry: Mood & affect appropriate.     Data Reviewed:   CBC: Recent Labs  Lab 01/30/20 1246 01/31/20 0346 02/01/20 0635  WBC 6.1 4.9 4.5  HGB 16.0 14.3 13.6  HCT 47.5 42.3 40.4  MCV 90.1 90.0 90.6  PLT 139* 116*  673*   Basic Metabolic Panel: Recent Labs  Lab 01/30/20 1246 01/31/20 0346 02/01/20 0635  NA 137 138 137  K 2.7* 2.8* 3.2*  CL 97* 101 105  CO2 21* 19* 19*  GLUCOSE 186* 167* 131*  BUN 13 13 11   CREATININE 1.19 0.97 0.90  CALCIUM 9.7 8.9 8.5*  MG 1.5*  --   --    GFR: Estimated Creatinine Clearance: 92.8 mL/min (by C-G formula based on SCr of 0.9 mg/dL). Liver Function Tests: Recent Labs  Lab 01/30/20 1246 01/31/20 0346 02/01/20 0635  AST 13* 10* 11*  ALT 11 10 9   ALKPHOS 71 61 58  BILITOT 3.0* 1.8* 1.6*  PROT 5.2* 4.3* 4.3*  ALBUMIN 2.6* 2.2* 2.1*   Recent Labs  Lab 01/30/20 1246  LIPASE 48   No results for input(s): AMMONIA in the last 168 hours. Coagulation Profile: Recent Labs  Lab 01/30/20 1246  INR 1.6*   Cardiac Enzymes: No results for input(s): CKTOTAL, CKMB, CKMBINDEX, TROPONINI in the last 168 hours. BNP (last 3 results) No results for input(s): PROBNP in the last 8760 hours. HbA1C: Recent Labs    01/30/20 1246  HGBA1C 6.5*   CBG: Recent Labs  Lab 01/31/20 1215 01/31/20 1615 01/31/20 2127 02/01/20 0612 02/01/20 1113  GLUCAP 158* 138* 121* 142* 147*   Lipid Profile: No results for input(s): CHOL, HDL, LDLCALC, TRIG, CHOLHDL, LDLDIRECT in the last 72 hours. Thyroid Function Tests: Recent Labs    01/30/20 1246  TSH 1.121   Anemia Panel: No results for input(s): VITAMINB12, FOLATE, FERRITIN, TIBC, IRON, RETICCTPCT in the last 72 hours. Urine analysis:    Component Value Date/Time   COLORURINE YELLOW 11/09/2019 1937   APPEARANCEUR CLEAR 11/09/2019 1937   LABSPEC <1.005 (L) 11/09/2019 1937   PHURINE 5.5 11/09/2019 1937   GLUCOSEU >=500 (A) 11/09/2019 1937   HGBUR NEGATIVE 11/09/2019 1937   BILIRUBINUR NEGATIVE 11/09/2019 1937   KETONESUR NEGATIVE 11/09/2019 1937   PROTEINUR NEGATIVE 11/09/2019 1937   UROBILINOGEN 1.0 06/10/2013 1000   NITRITE NEGATIVE 11/09/2019 1937   LEUKOCYTESUR NEGATIVE 11/09/2019 1937   Sepsis  Labs: @LABRCNTIP (procalcitonin:4,lacticidven:4)  ) Recent Results (from the past 240 hour(s))  Resp Panel by RT-PCR (Flu A&B, Covid) Nasopharyngeal Swab     Status: None   Collection Time: 01/30/20 12:38 PM   Specimen: Nasopharyngeal Swab; Nasopharyngeal(NP) swabs in vial transport medium  Result Value Ref Range Status   SARS Coronavirus 2 by RT PCR NEGATIVE NEGATIVE Final    Comment: (NOTE) SARS-CoV-2 target nucleic acids are NOT DETECTED.  The SARS-CoV-2 RNA is generally detectable in upper respiratory specimens during the acute phase of infection. The lowest concentration of SARS-CoV-2 viral copies this assay can detect is 138 copies/mL. A negative result does not preclude SARS-Cov-2 infection and should not be used as the sole basis for treatment or other patient management decisions. A negative result may occur with  improper specimen collection/handling, submission of specimen other  than nasopharyngeal swab, presence of viral mutation(s) within the areas targeted by this assay, and inadequate number of viral copies(<138 copies/mL). A negative result must be combined with clinical observations, patient history, and epidemiological information. The expected result is Negative.  Fact Sheet for Patients:  EntrepreneurPulse.com.au  Fact Sheet for Healthcare Providers:  IncredibleEmployment.be  This test is no t yet approved or cleared by the [place] FDA and  has been authorized for detection and/or diagnosis of SARS-CoV-2 by FDA under an Emergency Use Authorization (EUA). This EUA will remain  in effect (meaning this test can be used) for the duration of the COVID-19 declaration under Section 564(b)(1) of the Act, 21 U.S.C.section 360bbb-3(b)(1), unless the authorization is terminated  or revoked sooner.       Influenza A by PCR NEGATIVE NEGATIVE Final   Influenza B by PCR NEGATIVE NEGATIVE Final    Comment: (NOTE) The Xpert  Xpress SARS-CoV-2/FLU/RSV plus assay is intended as an aid in the diagnosis of influenza from Nasopharyngeal swab specimens and should not be used as a sole basis for treatment. Nasal washings and aspirates are unacceptable for Xpert Xpress SARS-CoV-2/FLU/RSV testing.  Fact Sheet for Patients: EntrepreneurPulse.com.au  Fact Sheet for Healthcare Providers: IncredibleEmployment.be  This test is not yet approved or cleared by the [place] FDA and has been authorized for detection and/or diagnosis of SARS-CoV-2 by FDA under an Emergency Use Authorization (EUA). This EUA will remain in effect (meaning this test can be used) for the duration of the COVID-19 declaration under Section 564(b)(1) of the Act, 21 U.S.C. section 360bbb-3(b)(1), unless the authorization is terminated or revoked.  Performed at [facility], [place] [address] [place], [place] [address]   Culture, blood (routine x 2)     Status: None (Preliminary result)   Collection Time: 01/30/20  4:40 PM   Specimen: BLOOD RIGHT FOREARM  Result Value Ref Range Status   Specimen Description BLOOD RIGHT FOREARM  Final   Special Requests   Final    BOTTLES DRAWN AEROBIC AND ANAEROBIC Blood Culture results may not be optimal due to an inadequate volume of blood received in culture bottles   Culture   Final    NO GROWTH 2 DAYS Performed at [facility], [place] [address], [place], [place] [address]    Report Status PENDING  Incomplete  Culture, blood (routine x 2)     Status: None (Preliminary result)   Collection Time: 01/30/20  9:54 PM   Specimen: BLOOD  Result Value Ref Range Status   Specimen Description BLOOD LEFT HAND  Final   Special Requests   Final    BOTTLES DRAWN AEROBIC ONLY Blood Culture adequate volume   Culture   Final    NO GROWTH 2 DAYS Performed at [facility], [place] [address], [place], [place] [address]    Report Status PENDING  Incomplete  MRSA PCR  Screening     Status: None   Collection Time: 01/31/20  7:34 AM   Specimen: Nasal Mucosa; Nasopharyngeal  Result Value Ref Range Status   MRSA by PCR NEGATIVE NEGATIVE Final    Comment:        The GeneXpert MRSA Assay (FDA approved for NASAL specimens only), is one component of a comprehensive MRSA colonization surveillance program. It is not intended to diagnose MRSA infection nor to guide or monitor treatment for MRSA infections. Performed at [facility], [place] [address], [place], [place] [address]  Radiology Studies: CT ABDOMEN PELVIS WO CONTRAST  Result Date: 01/30/2020 CLINICAL DATA:  74 year old with lack of appetite.  Abdominal pain. EXAM: CT ABDOMEN AND PELVIS WITHOUT CONTRAST TECHNIQUE: Multidetector CT imaging of the abdomen and pelvis was performed following the standard protocol without IV contrast. COMPARISON:  11/22/2019 FINDINGS: Lower chest: Dependent densities at the lung bases. No significant pleural effusions. Small amount of pericardial fluid along the anterior aspect of the heart. Hepatobiliary: Evidence for gallstones with an air-fluid level in the gallbladder. No significant gallbladder distension. There may be minimal pericholecystic stranding. Normal appearance of the liver. No significant biliary dilatation. Pancreas: Unremarkable. No pancreatic ductal dilatation or surrounding inflammatory changes. Spleen: Normal in size without focal abnormality. Adrenals/Urinary Tract: Normal appearance of the adrenal glands. Bilateral renal cysts. Large cyst in the right kidney upper pole that measures up to 7.5 cm. Negative for hydronephrosis. Urinary bladder is markedly distended. No evidence for urinary stones. Stomach/Bowel: Diffuse wall thickening involving the rectum. There is perirectal edema and a small amount of presacral fluid or edema. Normal appearance of the stomach. No evidence for bowel obstruction. Questionable wall thickening involving the right colon on  sequence 3 image 42. Vascular/Lymphatic: Atherosclerotic calcifications involving the abdominal aorta without aneurysm. No lymph node enlargement in the abdomen or pelvis. Reproductive: Prostate is unremarkable. Other: Fat containing inguinal hernias, left side greater than right. Perirectal stranding with some presacral edema. No significant ascites. Negative for free intraperitoneal air. Musculoskeletal: No acute bone abnormality. IMPRESSION: 1. Cholelithiasis with new air-fluid level in the gallbladder. Evidence for mild pericholecystic stranding. 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 Recommend surgical consultation. 2. Diffuse wall thickening involving the rectum with perirectal edema. Findings are suggestive for inflammation and proctitis. Underlying neoplastic process cannot be excluded and recommend GI consultation. Questionable wall thickening involving the right colon. 3. Distention of the urinary bladder without hydronephrosis. 4. Bilateral renal cysts. These results were called by telephone at the time of interpretation on 01/30/2020 at 2:25 pm to provider [facility] , who verbally acknowledged these results. Electronically Signed   By: Markus Daft M.D.   On: 01/30/2020 14:30   Scheduled Meds: . bisoprolol  5 mg Oral Daily  . heparin injection (subcutaneous)  5,000 Units Subcutaneous Q8H  . insulin aspart  0-15 Units Subcutaneous TID WC  . insulin aspart  0-5 Units Subcutaneous QHS  . sodium chloride flush  3 mL Intravenous Q12H   Continuous Infusions: . ceFEPime (MAXIPIME) IV 2 g (02/01/20 3235)   And  . metronidazole 500 mg (02/01/20 1221)  . diltiazem (CARDIZEM) infusion Stopped (01/31/20 2245)  . potassium chloride 10 mEq (02/01/20 1219)     LOS: 2 days   Time spent: 53min  Domenic Polite, MD [facility]  02/01/2020, 1:18 PM

## 2020-02-02 ENCOUNTER — Encounter (HOSPITAL_COMMUNITY)
Admission: EM | Disposition: A | Discharge status: Skilled Nursing Facility | Payer: Self-pay | Source: Home / Self Care | Attending: Family Medicine

## 2020-02-02 ENCOUNTER — Encounter (HOSPITAL_COMMUNITY): Payer: Self-pay | Admitting: Internal Medicine

## 2020-02-02 ENCOUNTER — Inpatient Hospital Stay (HOSPITAL_COMMUNITY): Payer: Medicare PPO | Admitting: Anesthesiology

## 2020-02-02 LAB — CBC
HCT: 40.9 % (ref 39.0–52.0)
Hemoglobin: 13.9 g/dL (ref 13.0–17.0)
MCH: 30.6 pg (ref 26.0–34.0)
MCHC: 34 g/dL (ref 30.0–36.0)
MCV: 90.1 fL (ref 80.0–100.0)
Platelets: 96 10*3/uL — ABNORMAL LOW (ref 150–400)
RBC: 4.54 MIL/uL (ref 4.22–5.81)
RDW: 16 % — ABNORMAL HIGH (ref 11.5–15.5)
WBC: 4.3 10*3/uL (ref 4.0–10.5)
nRBC: 0 % (ref 0.0–0.2)

## 2020-02-02 LAB — SURGICAL PCR SCREEN
MRSA, PCR: NEGATIVE
Staphylococcus aureus: POSITIVE — AB

## 2020-02-02 LAB — GLUCOSE, CAPILLARY
Glucose-Capillary: 111 mg/dL — ABNORMAL HIGH (ref 70–99)
Glucose-Capillary: 132 mg/dL — ABNORMAL HIGH (ref 70–99)
Glucose-Capillary: 176 mg/dL — ABNORMAL HIGH (ref 70–99)
Glucose-Capillary: 157 mg/dL — ABNORMAL HIGH (ref 70–99)

## 2020-02-02 LAB — COMPREHENSIVE METABOLIC PANEL
ALT: 8 U/L (ref 0–44)
AST: 9 U/L — ABNORMAL LOW (ref 15–41)
Albumin: 2.1 g/dL — ABNORMAL LOW (ref 3.5–5.0)
Alkaline Phosphatase: 60 U/L (ref 38–126)
Anion gap: 14 (ref 5–15)
BUN: 13 mg/dL (ref 8–23)
CO2: 18 mmol/L — ABNORMAL LOW (ref 22–32)
Calcium: 8.5 mg/dL — ABNORMAL LOW (ref 8.9–10.3)
Chloride: 106 mmol/L (ref 98–111)
Creatinine, Ser: 0.88 mg/dL (ref 0.61–1.24)
GFR, Estimated: >60 mL/min (ref >60)
Glucose, Bld: 123 mg/dL — ABNORMAL HIGH (ref 70–99)
Potassium: 3.5 mmol/L (ref 3.5–5.1)
Sodium: 138 mmol/L (ref 135–145)
Total Bilirubin: 1.5 mg/dL — ABNORMAL HIGH (ref 0.3–1.2)
Total Protein: 4.1 g/dL — ABNORMAL LOW (ref 6.5–8.1)

## 2020-02-02 SURGERY — LAPAROSCOPIC CHOLECYSTECTOMY
Anesthesia: General | Site: Abdomen

## 2020-02-02 MED ORDER — SUGAMMADEX SODIUM 200 MG/2ML IV SOLN
INTRAVENOUS | Status: DC | PRN
  Administered 2020-02-02: 300 mg via INTRAVENOUS

## 2020-02-02 MED ORDER — LACTATED RINGERS IV SOLN: INTRAVENOUS | Status: DC

## 2020-02-02 MED ORDER — LIDOCAINE 2% (20 MG/ML) 5 ML SYRINGE
INTRAMUSCULAR | Status: AC
  Filled 2020-02-02: qty 10

## 2020-02-02 MED ORDER — CHLORHEXIDINE GLUCONATE 0.12 % MT SOLN: 15.0000 mL | Freq: Once | OROMUCOSAL | Status: DC

## 2020-02-02 MED ORDER — ONDANSETRON HCL 4 MG/2ML IJ SOLN: 4.0000 mg | Freq: Once | INTRAMUSCULAR | Status: DC | PRN

## 2020-02-02 MED ORDER — ACETAMINOPHEN 10 MG/ML IV SOLN
INTRAVENOUS | Status: AC
  Filled 2020-02-02: qty 100

## 2020-02-02 MED ORDER — 0.9 % SODIUM CHLORIDE (POUR BTL) OPTIME
TOPICAL | Status: DC | PRN
  Administered 2020-02-02: 1000 mL

## 2020-02-02 MED ORDER — PHENYLEPHRINE HCL-NACL 10-0.9 MG/250ML-% IV SOLN
INTRAVENOUS | Status: DC | PRN
  Administered 2020-02-02: 40 ug/min via INTRAVENOUS

## 2020-02-02 MED ORDER — DEXAMETHASONE SODIUM PHOSPHATE 10 MG/ML IJ SOLN
INTRAMUSCULAR | Status: DC | PRN
  Administered 2020-02-02: 5 mg via INTRAVENOUS

## 2020-02-02 MED ORDER — ONDANSETRON HCL 4 MG/2ML IJ SOLN
INTRAMUSCULAR | Status: AC
  Filled 2020-02-02: qty 2

## 2020-02-02 MED ORDER — SODIUM CHLORIDE 0.9 % IV SOLN: INTRAVENOUS | Status: DC

## 2020-02-02 MED ORDER — ESMOLOL HCL 100 MG/10ML IV SOLN
INTRAVENOUS | Status: DC | PRN
  Administered 2020-02-02 (×5): 20 mg via INTRAVENOUS

## 2020-02-02 MED ORDER — BUPIVACAINE HCL (PF) 0.25 % IJ SOLN
INTRAMUSCULAR | Status: DC | PRN
  Administered 2020-02-02: 15 mL

## 2020-02-02 MED ORDER — ESMOLOL HCL 100 MG/10ML IV SOLN
INTRAVENOUS | Status: AC
  Filled 2020-02-02: qty 10

## 2020-02-02 MED ORDER — FENTANYL CITRATE (PF) 100 MCG/2ML IJ SOLN
INTRAMUSCULAR | Status: AC
  Filled 2020-02-02: qty 2

## 2020-02-02 MED ORDER — FENTANYL CITRATE (PF) 100 MCG/2ML IJ SOLN
INTRAMUSCULAR | Status: DC | PRN
  Administered 2020-02-02: 25 ug via INTRAVENOUS
  Administered 2020-02-02: 150 ug via INTRAVENOUS

## 2020-02-02 MED ORDER — ALBUMIN HUMAN 5 % IV SOLN: INTRAVENOUS | Status: DC | PRN

## 2020-02-02 MED ORDER — ROCURONIUM BROMIDE 10 MG/ML (PF) SYRINGE
PREFILLED_SYRINGE | INTRAVENOUS | Status: DC | PRN
  Administered 2020-02-02: 20 mg via INTRAVENOUS
  Administered 2020-02-02: 60 mg via INTRAVENOUS

## 2020-02-02 MED ORDER — PHENYLEPHRINE 40 MCG/ML (10ML) SYRINGE FOR IV PUSH (FOR BLOOD PRESSURE SUPPORT)
PREFILLED_SYRINGE | INTRAVENOUS | Status: DC | PRN
  Administered 2020-02-02: 80 ug via INTRAVENOUS
  Administered 2020-02-02: 120 ug via INTRAVENOUS
  Administered 2020-02-02 (×2): 80 ug via INTRAVENOUS

## 2020-02-02 MED ORDER — ACETAMINOPHEN 10 MG/ML IV SOLN
1000.0000 mg | Freq: Once | INTRAVENOUS | Status: DC | PRN
  Administered 2020-02-02: 1000 mg via INTRAVENOUS

## 2020-02-02 MED ORDER — BUPIVACAINE HCL (PF) 0.25 % IJ SOLN
INTRAMUSCULAR | Status: AC
  Filled 2020-02-02: qty 30

## 2020-02-02 MED ORDER — PROPOFOL 10 MG/ML IV BOLUS
INTRAVENOUS | Status: DC | PRN
  Administered 2020-02-02: 100 mg via INTRAVENOUS

## 2020-02-02 MED ORDER — CHLORHEXIDINE GLUCONATE CLOTH 2 % EX PADS
6.0000 | MEDICATED_PAD | Freq: Every day | CUTANEOUS | Status: AC
  Administered 2020-02-03 – 2020-02-06 (×4): 6 via TOPICAL

## 2020-02-02 MED ORDER — PHENYLEPHRINE 40 MCG/ML (10ML) SYRINGE FOR IV PUSH (FOR BLOOD PRESSURE SUPPORT)
PREFILLED_SYRINGE | INTRAVENOUS | Status: AC
  Filled 2020-02-02: qty 10

## 2020-02-02 MED ORDER — MUPIROCIN 2 % EX OINT
1.0000 "application " | TOPICAL_OINTMENT | Freq: Two times a day (BID) | CUTANEOUS | Status: AC
  Administered 2020-02-02 – 2020-02-07 (×10): 1 via NASAL
  Filled 2020-02-02 (×2): qty 22

## 2020-02-02 MED ORDER — ORAL CARE MOUTH RINSE: 15.0000 mL | Freq: Once | OROMUCOSAL | Status: DC

## 2020-02-02 MED ORDER — PROPOFOL 10 MG/ML IV BOLUS
INTRAVENOUS | Status: AC
  Filled 2020-02-02: qty 20

## 2020-02-02 MED ORDER — FENTANYL CITRATE (PF) 250 MCG/5ML IJ SOLN
INTRAMUSCULAR | Status: AC
  Filled 2020-02-02: qty 5

## 2020-02-02 MED ORDER — FENTANYL CITRATE (PF) 100 MCG/2ML IJ SOLN
25.0000 ug | INTRAMUSCULAR | Status: DC | PRN
  Administered 2020-02-02: 50 ug via INTRAVENOUS

## 2020-02-02 MED ORDER — ONDANSETRON HCL 4 MG/2ML IJ SOLN
INTRAMUSCULAR | Status: DC | PRN
  Administered 2020-02-02: 4 mg via INTRAVENOUS

## 2020-02-02 MED ORDER — SODIUM CHLORIDE 0.9 % IR SOLN
Status: DC | PRN
  Administered 2020-02-02: 1000 mL
  Administered 2020-02-02: 3000 mL

## 2020-02-02 MED ORDER — ROCURONIUM BROMIDE 10 MG/ML (PF) SYRINGE
PREFILLED_SYRINGE | INTRAVENOUS | Status: AC
  Filled 2020-02-02: qty 30

## 2020-02-02 MED ORDER — SODIUM CHLORIDE 0.9 % IV SOLN
INTRAVENOUS | Status: DC | PRN
  Administered 2020-02-02: 250 mL via INTRAVENOUS

## 2020-02-02 MED ORDER — LIDOCAINE 2% (20 MG/ML) 5 ML SYRINGE
INTRAMUSCULAR | Status: DC | PRN
  Administered 2020-02-02: 60 mg via INTRAVENOUS

## 2020-02-02 SURGICAL SUPPLY — 47 items
ADH SKN CLS APL DERMABOND .7 (GAUZE/BANDAGES/DRESSINGS) ×2
APL PRP STRL LF DISP 70% ISPRP (MISCELLANEOUS) ×2
APPLIER CLIP 5 13 M/L LIGAMAX5 (MISCELLANEOUS) ×3
APR CLP MED LRG 5 ANG JAW (MISCELLANEOUS) ×2
BAG SPEC RTRVL 10 TROC 200 (ENDOMECHANICALS) ×2
BLADE CLIPPER SURG (BLADE) IMPLANT
CANISTER SUCT 3000ML PPV (MISCELLANEOUS) ×3 IMPLANT
CHLORAPREP W/TINT 26 (MISCELLANEOUS) ×3 IMPLANT
CLIP APPLIE 5 13 M/L LIGAMAX5 (MISCELLANEOUS) ×2 IMPLANT
COVER MAYO STAND STRL (DRAPES) IMPLANT
COVER SURGICAL LIGHT HANDLE (MISCELLANEOUS) ×3 IMPLANT
DERMABOND ADVANCED (GAUZE/BANDAGES/DRESSINGS) ×1
DERMABOND ADVANCED .7 DNX12 (GAUZE/BANDAGES/DRESSINGS) ×2 IMPLANT
DRAPE C-ARM 42X120 X-RAY (DRAPES) IMPLANT
ELECT REM PT RETURN 9FT ADLT (ELECTROSURGICAL) ×3
ELECTRODE REM PT RTRN 9FT ADLT (ELECTROSURGICAL) ×2 IMPLANT
GLOVE BIO SURGEON STRL SZ8 (GLOVE) ×3 IMPLANT
GLOVE BIOGEL PI IND STRL 8 (GLOVE) ×2 IMPLANT
GLOVE BIOGEL PI INDICATOR 8 (GLOVE) ×1
GOWN STRL REUS W/ TWL LRG LVL3 (GOWN DISPOSABLE) ×4 IMPLANT
GOWN STRL REUS W/ TWL XL LVL3 (GOWN DISPOSABLE) ×2 IMPLANT
GOWN STRL REUS W/TWL LRG LVL3 (GOWN DISPOSABLE) ×6
GOWN STRL REUS W/TWL XL LVL3 (GOWN DISPOSABLE) ×3
KIT BASIN OR (CUSTOM PROCEDURE TRAY) ×3 IMPLANT
KIT TURNOVER KIT B (KITS) ×3 IMPLANT
L-HOOK LAP DISP 36CM (ELECTROSURGICAL) ×3
LHOOK LAP DISP 36CM (ELECTROSURGICAL) ×2 IMPLANT
NEEDLE 22X1 1/2 (OR ONLY) (NEEDLE) ×3 IMPLANT
NS IRRIG 1000ML POUR BTL (IV SOLUTION) ×3 IMPLANT
PAD ARMBOARD 7.5X6 YLW CONV (MISCELLANEOUS) ×3 IMPLANT
PENCIL BUTTON HOLSTER BLD 10FT (ELECTRODE) ×3 IMPLANT
POUCH RETRIEVAL ECOSAC 10 (ENDOMECHANICALS) ×2 IMPLANT
POUCH RETRIEVAL ECOSAC 10MM (ENDOMECHANICALS) ×3
SCISSORS LAP 5X35 DISP (ENDOMECHANICALS) ×3 IMPLANT
SET CHOLANGIOGRAPH 5 50 .035 (SET/KITS/TRAYS/PACK) IMPLANT
SET IRRIG TUBING LAPAROSCOPIC (IRRIGATION / IRRIGATOR) ×3 IMPLANT
SET TUBE SMOKE EVAC HIGH FLOW (TUBING) ×3 IMPLANT
SLEEVE ENDOPATH XCEL 5M (ENDOMECHANICALS) ×6 IMPLANT
SPECIMEN JAR SMALL (MISCELLANEOUS) ×3 IMPLANT
SUT VIC AB 4-0 PS2 27 (SUTURE) ×3 IMPLANT
TOWEL GREEN STERILE (TOWEL DISPOSABLE) ×3 IMPLANT
TOWEL GREEN STERILE FF (TOWEL DISPOSABLE) ×3 IMPLANT
TRAY LAPAROSCOPIC MC (CUSTOM PROCEDURE TRAY) ×3 IMPLANT
TROCAR XCEL BLUNT TIP 100MML (ENDOMECHANICALS) ×3 IMPLANT
TROCAR XCEL NON-BLD 11X100MML (ENDOMECHANICALS) ×2 IMPLANT
TROCAR XCEL NON-BLD 5MMX100MML (ENDOMECHANICALS) ×3 IMPLANT
WATER STERILE IRR 1000ML POUR (IV SOLUTION) ×3 IMPLANT

## 2020-02-02 NOTE — Op Note (Signed)
  01/30/2020 - 02/02/2020  1:01 PM  PATIENT:  Gary Butler  74 y.o. male  PRE-OPERATIVE DIAGNOSIS:  cholecystitis  POST-OPERATIVE DIAGNOSIS:  cholecystitis  PROCEDURE:  Procedure(s): LAPAROSCOPIC CHOLECYSTECTOMY  SURGEON:  Georganna Skeans, MD  ASSISTANTS: Alphonsa Overall, MD  ANESTHESIA:   local and general  EBL:  Total I/O In: 76 [I.V.:400; IV Piggyback:250] Out: 10 [Blood:10]  BLOOD ADMINISTERED:none  DRAINS: none   SPECIMEN:  Excision  DISPOSITION OF SPECIMEN:  PATHOLOGY  COUNTS:  YES  DICTATION: .Dragon Dictation Procedure in detail: Informed consent was obtained.  He is on IV antibiotics.  He was brought to the operating room and general endotracheal anesthesia was administered by the anesthesia staff.  His abdomen was prepped and draped in sterile fashion.  We did a timeout procedure.The supraumbilical region was infiltrated with local. supraumbilical incision was made. Subcutaneous tissues were dissected down revealing the anterior fascia. This was divided sharply along the midline. Peritoneal cavity was entered under direct vision without complication. A 0 Vicryl pursestring was placed around the fascial opening. Hassan trocar was inserted into the abdomen. The abdomen was insufflated with carbon dioxide in standard fashion. Under direct vision a 5 mm epigastric and 5 mm right lateral ports x2 were placed.  Local was used at each port site.  Laparoscopic exploration revealed a very tense and distended gallbladder.  This was drained with the Nahzat suction.  Bile was very thick.  This allowed the dome to be retracted superior medially.  The infundibulum was retracted inferior laterally.  Dissection began laterally and progressed medially identifying the cystic duct and cystic artery.  Attention was first directed to the cystic duct.  We continued dissection until a critical view of safety was achieved.  The cystic duct was fairly short.  Decision was made to not do a cholangiogram.   3 clips were placed proximally and one was placed distally.  It was divided.  The cystic artery was identified and clipped twice proximally, once distally and divided.  The gallbladder was taken off the liver bed using Bovie cautery.  It was leaking from the drainage site as well as another hole on the side.  The gallbladder was removed the rest of the way from the liver bed and cautery was used to get good hemostasis.  The gallbladder was placed in a bag and removed from the abdomen via the supraumbilical port site.  At this point I upsized the epigastric port in order to suction out all of the spilled gallbladder contents.  I used a large suction tube and thoroughly irrigated the right upper quadrant and suctioned out all the remaining bile and some small stones.  The area looked clean.  Gallbladder bed was dry.  Clips remain in good position.  Ports were removed under direct vision.  Pneumoperitoneum was released.  Supraumbilical fascia was closed by tying the pursestring.  All 4 wounds were irrigated and the skin of each was closed with 4-0 Vicryl followed by Dermabond.  All counts were correct.  He tolerated procedure well without apparent complication was taken recovery in stable condition.  PATIENT DISPOSITION:  PACU - hemodynamically stable.   Delay start of Pharmacological VTE agent (>24hrs) due to surgical blood loss or risk of bleeding:  no  Georganna Skeans, MD, MPH, FACS Pager: [phone]  12/21/20211:01 PM

## 2020-02-02 NOTE — Anesthesia Preprocedure Evaluation (Addendum)
Anesthesia Evaluation  Patient identified by MRN, date of birth, ID band Patient awake    Reviewed: Allergy & Precautions, NPO status , Patient's Chart, lab work & pertinent test results  Airway Mallampati: III  TM Distance: >3 FB Neck ROM: Full    Dental  (+) Chipped,    Pulmonary sleep apnea ,    Pulmonary exam normal breath sounds clear to auscultation       Cardiovascular hypertension, Pt. on home beta blockers +CHF   Rhythm:Irregular Rate:Tachycardia  ECG: a-fib with RVR  ECHO: 1. Left ventricular ejection fraction, by estimation, is 60 to 65%. The left ventricle has normal function. The left ventricle has no regional wall motion abnormalities. Left ventricular diastolic parameters are consistent with Grade II diastolic dysfunction (pseudonormalization).  2. Right ventricular systolic function is normal. The right ventricular size is normal.  3. The mitral valve is normal in structure. No evidence of mitral valve regurgitation. No evidence of mitral stenosis.  4. The aortic valve is normal in structure. Aortic valve regurgitation is not visualized. No aortic stenosis is present.  5. The inferior vena cava is normal in size with greater than 50% respiratory variability, suggesting right atrial pressure of 3 mmHg.    Neuro/Psych negative neurological ROS  negative psych ROS   GI/Hepatic Neg liver ROS, GERD  Medicated,  Endo/Other  diabetesMorbid obesity  Renal/GU negative Renal ROS     Musculoskeletal  (+) Arthritis , Gout   Abdominal (+) + obese,   Peds  Hematology  (+) Blood dyscrasia, , Thrombocytopenia    Anesthesia Other Findings cholecystitis  Reproductive/Obstetrics                           Anesthesia Physical Anesthesia Plan  ASA: IV  Anesthesia Plan: General   Post-op Pain Management:    Induction: Intravenous  PONV Risk Score and Plan: 3 and Ondansetron,  Dexamethasone and Treatment may vary due to age or medical condition  Airway Management Planned: Oral ETT  Additional Equipment:   Intra-op Plan:   Post-operative Plan: Extubation in OR  Informed Consent: I have reviewed the patients History and Physical, chart, labs and discussed the procedure including the risks, benefits and alternatives for the proposed anesthesia with the patient or authorized representative who has indicated his/her understanding and acceptance.   Patient has DNR.  Discussed DNR with patient and Suspend DNR.   Dental advisory given  Plan Discussed with: CRNA  Anesthesia Plan Comments: (Diltiazem drip)      Anesthesia Quick Evaluation

## 2020-02-02 NOTE — Progress Notes (Signed)
Subjective/Chief Complaint: Some RUQ pain   Objective: Vital signs in last 24 hours: Temp:  [97.8 F (36.6 C)-98.1 F (36.7 C)] 97.8 F (36.6 C) (12/21 0748) Pulse Rate:  [95-123] 123 (12/21 0748) Resp:  [13-20] 19 (12/21 0748) BP: (94-102)/(62-77) 94/70 (12/21 0748) SpO2:  [90 %-99 %] 97 % (12/21 0748) Weight:  [119.6 kg] 119.6 kg (12/21 0500) Last BM Date: 02/01/20  Intake/Output from previous day: 12/20 0701 - 12/21 0700 In: 1101.7 [IV Piggyback:1101.7] Out: 450 [Urine:450] Intake/Output this shift: No intake/output data recorded.  General appearance: cooperative Resp: clear to auscultation bilaterally Cardio: S1, S2 normal GI: soft, some RUQ tenderness Neurologic: Mental status: Alert, oriented, thought content appropriate  Lab Results:  Recent Labs    02/01/20 0635 02/02/20 0123  WBC 4.5 4.3  HGB 13.6 13.9  HCT 40.4 40.9  PLT 111* 96*   BMET Recent Labs    02/01/20 0635 02/02/20 0123  NA 137 138  K 3.2* 3.5  CL 105 106  CO2 19* 18*  GLUCOSE 131* 123*  BUN 11 13  CREATININE 0.90 0.88  CALCIUM 8.5* 8.5*   PT/INR Recent Labs    01/30/20 1246  LABPROT 18.8*  INR 1.6*   ABG No results for input(s): PHART, HCO3 in the last 72 hours.  Invalid input(s): PCO2, PO2  Studies/Results: NM Hepatobiliary Liver Func  Result Date: 02/01/2020 CLINICAL DATA:  Upper abdominal pain, nausea and vomiting EXAM: NUCLEAR MEDICINE HEPATOBILIARY IMAGING TECHNIQUE: Sequential images of the abdomen were obtained out to 60 minutes following intravenous administration of radiopharmaceutical. RADIOPHARMACEUTICALS:  4.91 mCi Tc-35m Choletec IV, 2 mg morphine given intravenously COMPARISON:  01/30/2020 FINDINGS: There is prompt uptake of radiotracer by the hepatic parenchyma. Activity is seen within the common bile duct by 25 minutes. Activity is seen within the small bowel by 30 minutes. At 90 minutes, the gallbladder is still not visualized. 2 mg of morphine was given  and subsequent imaging was performed for 30 minutes. The gallbladder is not visualized by the conclusion of the exam. IMPRESSION: 1. Nonvisualization of the gallbladder despite morphine administration, consistent with acute cholecystitis and cystic duct obstruction. Electronically Signed   By: Randa Ngo M.D.   On: 02/01/2020 21:38    Anti-infectives: Anti-infectives (From admission, onward)   Start     Dose/Rate Route Frequency Ordered Stop   01/31/20 1400  ceFEPIme (MAXIPIME) 2 g in sodium chloride 0.9 % 100 mL IVPB       "And" Linked Group Details   2 g 200 mL/hr over 30 Minutes Intravenous Every 8 hours 01/31/20 1158     01/31/20 1245  metroNIDAZOLE (FLAGYL) IVPB 500 mg       "And" Linked Group Details   500 mg 100 mL/hr over 60 Minutes Intravenous Every 8 hours 01/31/20 1158     01/30/20 1445  ceFEPIme (MAXIPIME) 2 g in sodium chloride 0.9 % 100 mL IVPB       "And" Linked Group Details   2 g 200 mL/hr over 30 Minutes Intravenous  Once 01/30/20 1438 01/30/20 1856   01/30/20 1445  metroNIDAZOLE (FLAGYL) IVPB 500 mg       "And" Linked Group Details   500 mg 100 mL/hr over 60 Minutes Intravenous  Once 01/30/20 1438 01/30/20 1856      Assessment/Plan: Cholecystitis confirmed on HIDA - noted Cardiology input, will proceed with laparoscopic cholecystectomy, cholangiogram today. I discussed the procedure, risks, and benefits. He agrees. On Maxipime and Flagyl.  LOS: 3 days  Zenovia Jarred 02/02/2020

## 2020-02-02 NOTE — Progress Notes (Signed)
Patient refused CPAP for the night  

## 2020-02-02 NOTE — Progress Notes (Signed)
PROGRESS NOTE    Gary Butler  [id number] DOB: [birth year] DOA: 01/30/2020 PCP: [name], MD  Brief Narrative:HPI: Gary Butler is a 74/M chronically ill with history significant of OSA; psoriatic arthritis; morbid obesity (BMI 40); HTN; DM; BPH, chronic diastolic CHF (0000000 echo with preserved EF and grade 2 diastolic dysfunction) presentedwith nausea and anorexia.   -prior admission from 9/27-10/5 with perirectal abscess s/p I&D. he also had new onset afib and was started on Eliquis. --Last admitted from 10/9-13 with hematochezia; this was felt to be related to recent perineal surgery site bleeding and Eliquis was resumed at hospital d/c.  He was discharged to [facility] for rehab and was discharged to home in late November.   He has recently been referred to palliative care as an outpatient.  - He has had 3 recent ER visits to [facility] hospital most recently on 12/10.  He reported 8 months of anorexia, nausea and post-meal emesis.   -Progressively worsened, has been on ice chips for several weeks, lost about 100 pounds of weight  ED Course:  In the ED noted to have A. fib RVR, severe hypokalemia -CT abdomen pelvis concerning for emphysematous/gangrenous cholecystitis -Seen by general surgery and cardiology for clearance  Assessment & Plan:   Sepsis Gangrenous/emphysematous cholecystitis -General surgery following, and seen by cardiology for clearance -Ongoing lingering symptoms for 6 to 8 months, suspect acute on chronic cholecystitis -Continue IV fluids today -General surgery following, HIDA scan abnormal -Greatly appreciate Dr. Biagio Borg input, plan for lap chole today  Recent perirectal abscess  -With perirectal thickening on CT -Antibiotics as above, I wonder if this is unexpected in the setting of recent abscess and surgery  A. fib with RVR Chronic diastolic CHF -Continue Cardizem drip -Continue bisoprolol as tolerated -Held heparin in the setting of  gangrenous cholecystitis and risk of intra-abdominal bleeding, start DVT prophylaxis with heparin SQ tomorrow and then Eliquis in 2 to 3 days  Type 2 diabetes mellitus -Jardiance on hold, continue sliding scale insulin  Mild dementia/cognitive deficits  Morbid obesity Obstructive sleep apnea -Has lost 100 pounds and needs continued weight loss -Has not been using BiPAP/CPAP following weight loss  Goals of care DO NOT RESUSCITATE -Followed by palliative care, not under hospice  DVT prophylaxis: Heparin subcu Code Status: DNR Family Communication: No family at bedside will update spouse Disposition Plan:  Status is: Inpatient  Remains inpatient appropriate because:Inpatient level of care appropriate due to severity of illness   Dispo: The patient is from: Home              Anticipated d/c is to: May need rehab              Anticipated d/c date is: > 3 days              Patient currently is not medically stable to d/c.  Consultants:   General surgery  Cardiology   Procedures:   Antimicrobials:    Subjective: -Complains of nausea, mild right-sided discomfort  Objective: Vitals:   02/02/20 1030 02/02/20 1035 02/02/20 1042 02/02/20 1050  BP:  121/90  96/67  Pulse: (!) 144  (!) 121 (!) 130  Resp: (!) 34 (!) 24 (!) 23 20  Temp:      TempSrc:      SpO2: 98%  96% 97%  Weight:      Height:        Intake/Output Summary (Last 24 hours) at 02/02/2020 1315 Last data filed  at 02/02/2020 1251 Gross per 24 hour  Intake 1751.67 ml  Output 460 ml  Net 1291.67 ml   Filed Weights   01/30/20 1207 01/30/20 2107 02/02/20 0500  Weight: 131.5 kg 118.4 kg 119.6 kg    Examination:  General exam: Obese chronically ill elderly male, laying in bed, awake alert oriented x3, uncomfortable appearing CVS: S1-S2, irregularly irregular rhythm Lungs: Decreased breath sounds the bases, otherwise clear Abdomen: Obese, soft, nontender, bowel sounds diminished Extremities: No edema   Skin: No rashes on exposed skin  Psychiatry: Mood & affect appropriate.     Data Reviewed:   CBC: Recent Labs  Lab 01/30/20 1246 01/31/20 0346 02/01/20 0635 02/02/20 0123  WBC 6.1 4.9 4.5 4.3  HGB 16.0 14.3 13.6 13.9  HCT 47.5 42.3 40.4 40.9  MCV 90.1 90.0 90.6 90.1  PLT 139* 116* 111* 96*   Basic Metabolic Panel: Recent Labs  Lab 01/30/20 1246 01/31/20 0346 02/01/20 0635 02/02/20 0123  NA 137 138 137 138  K 2.7* 2.8* 3.2* 3.5  CL 97* 101 105 106  CO2 21* 19* 19* 18*  GLUCOSE 186* 167* 131* 123*  BUN 13 13 11 13   CREATININE 1.19 0.97 0.90 0.88  CALCIUM 9.7 8.9 8.5* 8.5*  MG 1.5*  --   --   --    GFR: Estimated Creatinine Clearance: 95.4 mL/min (by C-G formula based on SCr of 0.88 mg/dL). Liver Function Tests: Recent Labs  Lab 01/30/20 1246 01/31/20 0346 02/01/20 0635 02/02/20 0123  AST 13* 10* 11* 9*  ALT 11 10 9 8   ALKPHOS 71 61 58 60  BILITOT 3.0* 1.8* 1.6* 1.5*  PROT 5.2* 4.3* 4.3* 4.1*  ALBUMIN 2.6* 2.2* 2.1* 2.1*   Recent Labs  Lab 01/30/20 1246  LIPASE 48   No results for input(s): AMMONIA in the last 168 hours. Coagulation Profile: Recent Labs  Lab 01/30/20 1246  INR 1.6*   Cardiac Enzymes: No results for input(s): CKTOTAL, CKMB, CKMBINDEX, TROPONINI in the last 168 hours. BNP (last 3 results) No results for input(s): PROBNP in the last 8760 hours. HbA1C: No results for input(s): HGBA1C in the last 72 hours. CBG: Recent Labs  Lab 02/01/20 1113 02/01/20 1651 02/01/20 2140 02/02/20 0617 02/02/20 0959  GLUCAP 147* 132* 121* 111* 132*   Lipid Profile: No results for input(s): CHOL, HDL, LDLCALC, TRIG, CHOLHDL, LDLDIRECT in the last 72 hours. Thyroid Function Tests: No results for input(s): TSH, T4TOTAL, FREET4, T3FREE, THYROIDAB in the last 72 hours. Anemia Panel: No results for input(s): VITAMINB12, FOLATE, FERRITIN, TIBC, IRON, RETICCTPCT in the last 72 hours. Urine analysis:    Component Value Date/Time   COLORURINE  YELLOW 11/09/2019 1937   APPEARANCEUR CLEAR 11/09/2019 1937   LABSPEC <1.005 (L) 11/09/2019 1937   PHURINE 5.5 11/09/2019 1937   GLUCOSEU >=500 (A) 11/09/2019 1937   HGBUR NEGATIVE 11/09/2019 1937   BILIRUBINUR NEGATIVE 11/09/2019 1937   KETONESUR NEGATIVE 11/09/2019 1937   PROTEINUR NEGATIVE 11/09/2019 1937   UROBILINOGEN 1.0 06/10/2013 1000   NITRITE NEGATIVE 11/09/2019 1937   LEUKOCYTESUR NEGATIVE 11/09/2019 1937   Sepsis Labs: @LABRCNTIP (procalcitonin:4,lacticidven:4)  ) Recent Results (from the past 240 hour(s))  Resp Panel by RT-PCR (Flu A&B, Covid) Nasopharyngeal Swab     Status: None   Collection Time: 01/30/20 12:38 PM   Specimen: Nasopharyngeal Swab; Nasopharyngeal(NP) swabs in vial transport medium  Result Value Ref Range Status   SARS Coronavirus 2 by RT PCR NEGATIVE NEGATIVE Final    Comment: (NOTE) SARS-CoV-2 target nucleic  acids are NOT DETECTED.  The SARS-CoV-2 RNA is generally detectable in upper respiratory specimens during the acute phase of infection. The lowest concentration of SARS-CoV-2 viral copies this assay can detect is 138 copies/mL. A negative result does not preclude SARS-Cov-2 infection and should not be used as the sole basis for treatment or other patient management decisions. A negative result may occur with  improper specimen collection/handling, submission of specimen other than nasopharyngeal swab, presence of viral mutation(s) within the areas targeted by this assay, and inadequate number of viral copies(<138 copies/mL). A negative result must be combined with clinical observations, patient history, and epidemiological information. The expected result is Negative.  Fact Sheet for Patients:  EntrepreneurPulse.com.au  Fact Sheet for Healthcare Providers:  IncredibleEmployment.be  This test is no t yet approved or cleared by the [place] FDA and  has been authorized for detection and/or diagnosis  of SARS-CoV-2 by FDA under an Emergency Use Authorization (EUA). This EUA will remain  in effect (meaning this test can be used) for the duration of the COVID-19 declaration under Section 564(b)(1) of the Act, 21 U.S.C.section 360bbb-3(b)(1), unless the authorization is terminated  or revoked sooner.       Influenza A by PCR NEGATIVE NEGATIVE Final   Influenza B by PCR NEGATIVE NEGATIVE Final    Comment: (NOTE) The Xpert Xpress SARS-CoV-2/FLU/RSV plus assay is intended as an aid in the diagnosis of influenza from Nasopharyngeal swab specimens and should not be used as a sole basis for treatment. Nasal washings and aspirates are unacceptable for Xpert Xpress SARS-CoV-2/FLU/RSV testing.  Fact Sheet for Patients: EntrepreneurPulse.com.au  Fact Sheet for Healthcare Providers: IncredibleEmployment.be  This test is not yet approved or cleared by the [place] FDA and has been authorized for detection and/or diagnosis of SARS-CoV-2 by FDA under an Emergency Use Authorization (EUA). This EUA will remain in effect (meaning this test can be used) for the duration of the COVID-19 declaration under Section 564(b)(1) of the Act, 21 U.S.C. section 360bbb-3(b)(1), unless the authorization is terminated or revoked.  Performed at [facility], [place] [address] [place], [place] [address]   Culture, blood (routine x 2)     Status: None (Preliminary result)   Collection Time: 01/30/20  4:40 PM   Specimen: BLOOD RIGHT FOREARM  Result Value Ref Range Status   Specimen Description BLOOD RIGHT FOREARM  Final   Special Requests   Final    BOTTLES DRAWN AEROBIC AND ANAEROBIC Blood Culture results may not be optimal due to an inadequate volume of blood received in culture bottles   Culture   Final    NO GROWTH 3 DAYS Performed at [facility], [place] [address], [place], [place] [address]    Report Status PENDING  Incomplete  Culture, blood (routine  x 2)     Status: None (Preliminary result)   Collection Time: 01/30/20  9:54 PM   Specimen: BLOOD  Result Value Ref Range Status   Specimen Description BLOOD LEFT HAND  Final   Special Requests   Final    BOTTLES DRAWN AEROBIC ONLY Blood Culture adequate volume   Culture   Final    NO GROWTH 3 DAYS Performed at [facility], [place] [address], [place], [place] [address]    Report Status PENDING  Incomplete  MRSA PCR Screening     Status: None   Collection Time: 01/31/20  7:34 AM   Specimen: Nasal Mucosa; Nasopharyngeal  Result Value Ref Range Status  MRSA by PCR NEGATIVE NEGATIVE Final    Comment:        The GeneXpert MRSA Assay (FDA approved for NASAL specimens only), is one component of a comprehensive MRSA colonization surveillance program. It is not intended to diagnose MRSA infection nor to guide or monitor treatment for MRSA infections. Performed at [facility], [place] [address] [place], [place] [address]   Surgical pcr screen     Status: Abnormal   Collection Time: 02/02/20  6:06 AM   Specimen: Nasal Mucosa; Nasal Swab  Result Value Ref Range Status   MRSA, PCR NEGATIVE NEGATIVE Final   Staphylococcus aureus POSITIVE (A) NEGATIVE Final    Comment: (NOTE) The Xpert SA Assay (FDA approved for NASAL specimens in patients 32 years of age and older), is one component of a comprehensive surveillance program. It is not intended to diagnose infection nor to guide or monitor treatment. Performed at [facility], [place] [address] [place], [place] [address]     Radiology Studies: NM Hepatobiliary Liver Func  Result Date: 02/01/2020 CLINICAL DATA:  Upper abdominal pain, nausea and vomiting EXAM: NUCLEAR MEDICINE HEPATOBILIARY IMAGING TECHNIQUE: Sequential images of the abdomen were obtained out to 60 minutes following intravenous administration of radiopharmaceutical. RADIOPHARMACEUTICALS:  4.91 mCi Tc-44m Choletec IV, 2 mg morphine given intravenously  COMPARISON:  01/30/2020 FINDINGS: There is prompt uptake of radiotracer by the hepatic parenchyma. Activity is seen within the common bile duct by 25 minutes. Activity is seen within the small bowel by 30 minutes. At 90 minutes, the gallbladder is still not visualized. 2 mg of morphine was given and subsequent imaging was performed for 30 minutes. The gallbladder is not visualized by the conclusion of the exam. IMPRESSION: 1. Nonvisualization of the gallbladder despite morphine administration, consistent with acute cholecystitis and cystic duct obstruction. Electronically Signed   By: Randa Ngo M.D.   On: 02/01/2020 21:38   Scheduled Meds: . [MAR Hold] bisoprolol  5 mg Oral Daily  . chlorhexidine  15 mL Mouth/Throat Once   Or  . mouth rinse  15 mL Mouth Rinse Once  . [MAR Hold] heparin injection (subcutaneous)  5,000 Units Subcutaneous Q8H  . [MAR Hold] insulin aspart  0-15 Units Subcutaneous TID WC  . [MAR Hold] insulin aspart  0-5 Units Subcutaneous QHS  . [MAR Hold] sodium chloride flush  3 mL Intravenous Q12H   Continuous Infusions: . sodium chloride    . [MAR Hold] ceFEPime (MAXIPIME) IV 2 g (02/02/20 0600)   And  . [EXB Hold] metronidazole 500 mg (02/02/20 2841)  . [MAR Hold] diltiazem (CARDIZEM) infusion 15 mg/hr (02/02/20 1302)  . lactated ringers       LOS: 3 days   Time spent: 44min  Domenic Polite, MD [facility]  02/02/2020, 1:15 PM

## 2020-02-02 NOTE — Anesthesia Postprocedure Evaluation (Signed)
Anesthesia Post Note  Patient: Fernandez Kenley  Procedure(s) Performed: LAPAROSCOPIC CHOLECYSTECTOMY (N/A Abdomen)     Patient location during evaluation: PACU Anesthesia Type: General Level of consciousness: awake Pain management: pain level controlled Vital Signs Assessment: post-procedure vital signs reviewed and stable Respiratory status: spontaneous breathing, nonlabored ventilation, respiratory function stable and patient connected to nasal cannula oxygen Cardiovascular status: blood pressure returned to baseline and stable Postop Assessment: no apparent nausea or vomiting Anesthetic complications: no   No complications documented.  Last Vitals:  Vitals:   02/02/20 1345 02/02/20 1421  BP: 105/72 105/67  Pulse:  (!) 109  Resp:  18  Temp:  36.4 C  SpO2:  99%    Last Pain:  Vitals:   02/02/20 1508  TempSrc:   PainSc: Asleep                 Shanvi Moyd P Graeden Bitner

## 2020-02-02 NOTE — Transfer of Care (Signed)
Immediate Anesthesia Transfer of Care Note  Patient: Gary Butler  Procedure(s) Performed: LAPAROSCOPIC CHOLECYSTECTOMY (N/A Abdomen)  Patient Location: PACU  Anesthesia Type:General  Level of Consciousness: awake and alert   Airway & Oxygen Therapy: Patient Spontanous Breathing and Patient connected to face mask oxygen  Post-op Assessment: Report given to RN and Post -op Vital signs reviewed and stable  Post vital signs: Reviewed and stable  Last Vitals:  Vitals Value Taken Time  BP 118/81 02/02/20 1324  Temp    Pulse 120 02/02/20 1326  Resp 28 02/02/20 1327  SpO2 96 % 02/02/20 1326  Vitals shown include unvalidated device data.  Last Pain:  Vitals:   02/02/20 0748  TempSrc: Oral  PainSc: 0-No pain         Complications: No complications documented.

## 2020-02-02 NOTE — Anesthesia Procedure Notes (Signed)
Procedure Name: Intubation Date/Time: 02/02/2020 11:09 AM Performed by: Inda Coke, CRNA Pre-anesthesia Checklist: Patient identified, Emergency Drugs available, Suction available and Patient being monitored Patient Re-evaluated:Patient Re-evaluated prior to induction Oxygen Delivery Method: Circle System Utilized Preoxygenation: Pre-oxygenation with 100% oxygen Induction Type: IV induction Ventilation: Mask ventilation without difficulty and Oral airway inserted - appropriate to patient size Laryngoscope Size: Mac and 4 Grade View: Grade III Tube type: Oral Number of attempts: 1 Airway Equipment and Method: Stylet and Oral airway Placement Confirmation: ETT inserted through vocal cords under direct vision,  positive ETCO2 and breath sounds checked- equal and bilateral Secured at: 22 cm Tube secured with: Tape Dental Injury: Teeth and Oropharynx as per pre-operative assessment  Difficulty Due To: Difficult Airway- due to reduced neck mobility and Difficult Airway- due to anterior larynx Future Recommendations: Recommend- induction with short-acting agent, and alternative techniques readily available

## 2020-02-03 ENCOUNTER — Encounter (HOSPITAL_COMMUNITY): Payer: Self-pay | Admitting: General Surgery

## 2020-02-03 LAB — COMPREHENSIVE METABOLIC PANEL
ALT: 16 U/L (ref 0–44)
AST: 30 U/L (ref 15–41)
Albumin: 2.2 g/dL — ABNORMAL LOW (ref 3.5–5.0)
Alkaline Phosphatase: 56 U/L (ref 38–126)
Anion gap: 12 (ref 5–15)
BUN: 10 mg/dL (ref 8–23)
CO2: 19 mmol/L — ABNORMAL LOW (ref 22–32)
Calcium: 8.5 mg/dL — ABNORMAL LOW (ref 8.9–10.3)
Chloride: 106 mmol/L (ref 98–111)
Creatinine, Ser: 0.89 mg/dL (ref 0.61–1.24)
GFR, Estimated: >60 mL/min (ref >60)
Glucose, Bld: 177 mg/dL — ABNORMAL HIGH (ref 70–99)
Potassium: 3.7 mmol/L (ref 3.5–5.1)
Sodium: 137 mmol/L (ref 135–145)
Total Bilirubin: 1.5 mg/dL — ABNORMAL HIGH (ref 0.3–1.2)
Total Protein: 4.3 g/dL — ABNORMAL LOW (ref 6.5–8.1)

## 2020-02-03 LAB — GLUCOSE, CAPILLARY
Glucose-Capillary: 173 mg/dL — ABNORMAL HIGH (ref 70–99)
Glucose-Capillary: 135 mg/dL — ABNORMAL HIGH (ref 70–99)
Glucose-Capillary: 149 mg/dL — ABNORMAL HIGH (ref 70–99)
Glucose-Capillary: 124 mg/dL — ABNORMAL HIGH (ref 70–99)

## 2020-02-03 LAB — CBC
HCT: 40.4 % (ref 39.0–52.0)
Hemoglobin: 13.6 g/dL (ref 13.0–17.0)
MCH: 30.2 pg (ref 26.0–34.0)
MCHC: 33.7 g/dL (ref 30.0–36.0)
MCV: 89.8 fL (ref 80.0–100.0)
Platelets: 94 10*3/uL — ABNORMAL LOW (ref 150–400)
RBC: 4.5 MIL/uL (ref 4.22–5.81)
RDW: 15.9 % — ABNORMAL HIGH (ref 11.5–15.5)
WBC: 6 10*3/uL (ref 4.0–10.5)
nRBC: 0 % (ref 0.0–0.2)

## 2020-02-03 LAB — APTT: aPTT: >200 seconds (ref 24–36)

## 2020-02-03 LAB — SURGICAL PATHOLOGY

## 2020-02-03 LAB — HEPARIN LEVEL (UNFRACTIONATED): Heparin Unfractionated: 0.5 IU/mL (ref 0.30–0.70)

## 2020-02-03 MED ORDER — METHOCARBAMOL 500 MG PO TABS
500.0000 mg | ORAL_TABLET | Freq: Four times a day (QID) | ORAL | Status: DC | PRN
  Administered 2020-02-04: 500 mg via ORAL
  Filled 2020-02-03: qty 1

## 2020-02-03 MED ORDER — TIZANIDINE HCL 4 MG PO TABS
4.0000 mg | ORAL_TABLET | Freq: Every day | ORAL | Status: DC | PRN
  Administered 2020-02-07 – 2020-02-08 (×3): 4 mg via ORAL
  Filled 2020-02-03 (×3): qty 1

## 2020-02-03 MED ORDER — OXYCODONE HCL 5 MG PO TABS
5.0000 mg | ORAL_TABLET | ORAL | Status: DC | PRN
  Administered 2020-02-03 – 2020-02-08 (×5): 10 mg via ORAL
  Filled 2020-02-03 (×5): qty 2

## 2020-02-03 MED ORDER — GLUCERNA SHAKE PO LIQD
237.0000 mL | Freq: Three times a day (TID) | ORAL | Status: DC
  Administered 2020-02-04 – 2020-02-09 (×7): 237 mL via ORAL

## 2020-02-03 MED ORDER — MORPHINE SULFATE (PF) 2 MG/ML IV SOLN: 2.0000 mg | INTRAVENOUS | Status: DC | PRN

## 2020-02-03 MED ORDER — HEPARIN (PORCINE) 25000 UT/250ML-% IV SOLN
1250.0000 [IU]/h | INTRAVENOUS | Status: DC
  Administered 2020-02-03 – 2020-02-04 (×2): 1250 [IU]/h via INTRAVENOUS
  Filled 2020-02-03 (×2): qty 250

## 2020-02-03 MED ORDER — ATORVASTATIN CALCIUM 40 MG PO TABS
40.0000 mg | ORAL_TABLET | Freq: Every day | ORAL | Status: DC
Start: 2020-02-03 — End: 2020-02-15
  Administered 2020-02-03 – 2020-02-14 (×11): 40 mg via ORAL
  Filled 2020-02-03 (×12): qty 1

## 2020-02-03 MED ORDER — TAMSULOSIN HCL 0.4 MG PO CAPS
0.4000 mg | ORAL_CAPSULE | Freq: Every day | ORAL | Status: DC
  Administered 2020-02-03 – 2020-02-14 (×11): 0.4 mg via ORAL
  Filled 2020-02-03 (×12): qty 1

## 2020-02-03 MED ORDER — BISOPROLOL FUMARATE 5 MG PO TABS
5.0000 mg | ORAL_TABLET | Freq: Every day | ORAL | Status: DC
  Administered 2020-02-04 – 2020-02-06 (×3): 5 mg via ORAL
  Filled 2020-02-03 (×3): qty 1

## 2020-02-03 NOTE — Evaluation (Signed)
Physical Therapy Evaluation Patient Details Name: Gary Butler MRN: [id number] DOB: February 05, 1946 Today's Date: 02/03/2020   History of Present Illness  Gary Butler is a 74/M chronically ill with history significant of OSA; psoriatic arthritis; morbid obesity (BMI 40); HTN; DM; BPH, chronic diastolic CHF (10/3808 echo with preserved EF and grade 2 diastolic dysfunction) presentedwith nausea and anorexia.    -prior admission from 9/27-10/5 with perirectal abscess s/p I&D. he also had new onset afib and was started on Eliquis.Ongoing lingering symptoms for 6 to 8 months, suspect acute on chronic cholecystitis.  Lap cholecystectomy 12/21.  Clinical Impression  Pt admitted with above diagnosis. Pt was able to stand and pivot to chair with RW with +2 mod assist to stand and min assist of 2 to pivot with RW. Pt dizziness limiting PT but pt could not tolerate full vestibular eval as he was throwing up on arrival.  Was able to use compensation techniques to get pt OOB to chair. Will continue. If pt refuses SNF, will need HHPT f/u.  Also will need wheelchair.  Pt currently with functional limitations due to the deficits listed below (see PT Problem List). Pt will benefit from skilled PT to increase their independence and safety with mobility to allow discharge to the venue listed below.      Follow Up Recommendations SNF;Supervision/Assistance - 24 hour    Equipment Recommendations  Wheelchair (20x18 with anti tippers, desk armrests and elevating legrests);Wheelchair cushion [phone] Ulice Dash cushion- pt has perirectal wound)    Recommendations for Other Services       Precautions / Restrictions Precautions Precautions: Fall Restrictions Weight Bearing Restrictions: No      Mobility  Bed Mobility Overal bed mobility: Needs Assistance Bed Mobility: Rolling;Sidelying to Sit Rolling: Mod assist Sidelying to sit: Mod assist       General bed mobility comments: Pt throwing up on arrival with NT in room.  Pt  HOB elevated and got cool wash cloth and pt was able to answer questions about home environement and proceed with evaluation.  Due to pt vomiting on arrival, educated pt regarding compensation techniques so that he could get to chair and will assess further at later date as pt could not follow testing at present nor tolerate given that he had just vomited.  Of  note, NT stated they had just cleaned pt and rolled him a lot.  Needs further vestibular assessemnt when feeling better.  Needed mod assist to come to eOB.  Pt dizzy and trying to get pt to follow commands to focus on object to help pt but he would not follow commands initially. When he focused, he did better.  Needed incr assist pulling up on PT due to internally distracted by pain and dizziness.    Transfers Overall transfer level: Needs assistance Equipment used: Rolling walker (2 wheeled) Transfers: Sit to/from [facility] Sit to Stand: Mod assist;+2 physical assistance;From elevated surface Stand pivot transfers: Min assist;+2 physical assistance       General transfer comment: Needded mod assist for power up and cues.  Stayed slightly flexed. Was able to take pivotal steps to chair with +2 min assist nd RW support.  Ambulation/Gait             General Gait Details: unable today  Stairs            Wheelchair Mobility    Modified Rankin (Stroke Patients Only)       Balance Overall balance assessment: Needs assistance Sitting-balance support: No  upper extremity supported;Feet supported Sitting balance-Leahy Scale: Fair     Standing balance support: Bilateral upper extremity supported;During functional activity Standing balance-Leahy Scale: Poor Standing balance comment: relies on UEs upport on RW                             Pertinent Vitals/Pain Pain Assessment: 0-10 Pain Score: 7  Pain Location: abdomen Pain Descriptors / Indicators: Aching;Grimacing;Guarding Pain  Intervention(s): Limited activity within patient's tolerance;Monitored during session;Repositioned    Home Living Family/patient expects to be discharged to:: Private residence Living Arrangements: Spouse/significant other (wife 24 hours and son at times) Available Help at Discharge: Family;Available 24 hours/day Type of Home: House Home Access: Ramped entrance     Home Layout: One level Home Equipment: Walker - 2 wheels;Bedside commode;Cane - single point;Shower seat      Prior Function Level of Independence: Independent with assistive device(s)               Hand Dominance        Extremity/Trunk Assessment   Upper Extremity Assessment Upper Extremity Assessment: Defer to OT evaluation    Lower Extremity Assessment Lower Extremity Assessment: Generalized weakness    Cervical / Trunk Assessment Cervical / Trunk Assessment: Normal  Communication   Communication: No difficulties  Cognition Arousal/Alertness: Awake/alert Behavior During Therapy: WFL for tasks assessed/performed Overall Cognitive Status: Impaired/Different from baseline Area of Impairment: Safety/judgement;Problem solving                         Safety/Judgement: Decreased awareness of safety;Decreased awareness of deficits   Problem Solving: Difficulty sequencing;Requires verbal cues General Comments: Pt with poor awarenes of deficits and difficulty following commands at times.  internally distracted      General Comments      Exercises     Assessment/Plan    PT Assessment Patient needs continued PT services  PT Problem List Decreased mobility;Decreased activity tolerance;Decreased balance;Decreased knowledge of use of DME;Decreased safety awareness;Decreased knowledge of precautions;Cardiopulmonary status limiting activity;Obesity       PT Treatment Interventions DME instruction;Gait training;Functional mobility training;Therapeutic activities;Therapeutic exercise;Balance  training;Patient/family education    PT Goals (Current goals can be found in the Care Plan section)  Acute Rehab PT Goals Patient Stated Goal: to go home PT Goal Formulation: With patient Time For Goal Achievement: 03/18/2020 Potential to Achieve Goals: Good    Frequency Min 3X/week   Barriers to discharge        Co-evaluation               AM-PAC PT "6 Clicks" Mobility  Outcome Measure Help needed turning from your back to your side while in a flat bed without using bedrails?: A Little Help needed moving from lying on your back to sitting on the side of a flat bed without using bedrails?: A Lot Help needed moving to and from a bed to a chair (including a wheelchair)?: Total Help needed standing up from a chair using your arms (e.g., wheelchair or bedside chair)?: Total Help needed to walk in hospital room?: Total Help needed climbing 3-5 steps with a railing? : Total 6 Click Score: 9    End of Session Equipment Utilized During Treatment: Gait belt Activity Tolerance: Patient limited by fatigue Patient left: in chair;with call bell/phone within reach;with chair alarm set Nurse Communication: Mobility status PT Visit Diagnosis: Muscle weakness (generalized) (M62.81);Unsteadiness on feet (R26.81);Dizziness and giddiness (R42)  Time: 1203-1238 PT Time Calculation (min) (ACUTE ONLY): 35 min   Charges:   PT Evaluation $PT Eval Moderate Complexity: 1 Mod PT Treatments $Therapeutic Activity: 8-22 mins        Kyira Volkert W,PT Acute Rehabilitation Services Pager:  [phone]  Office:  [phone]    Denice Paradise 02/03/2020, 1:54 PM

## 2020-02-03 NOTE — Progress Notes (Signed)
Mobility Specialist: Progress Note   02/03/20 1244  Mobility  Activity Transferred:  Bed to chair  Level of Assistance +2 (takes two people)  Assistive Device Front wheel walker  Mobility Response Tolerated fair  Mobility performed by Mobility specialist  Bed Position Chair  $Mobility charge 1 Mobility   Pre-Mobility: 107 HR, 114/74 BP, 97% SpO2 During Mobility: 155 HR Post-Mobility: 124 HR  Pt was assisted during transfer by PT and myself. Pt c/o dizziness pre- and during-mobility. Pt was +2 modA to stand and transfer. Pt with call bell in chair.   [facility] [place] Mobility Specialist

## 2020-02-03 NOTE — Progress Notes (Signed)
Patient refused labs, stated he is going home today.

## 2020-02-03 NOTE — Care Management Important Message (Signed)
Important Message  Patient Details  Name: Gary Butler MRN: [id number] Date of Birth: 1945-05-12   Medicare Important Message Given:  Yes     Shelda Altes 02/03/2020, 10:06 AM

## 2020-02-03 NOTE — Progress Notes (Signed)
PROGRESS NOTE    Gary Butler  [id number] DOB: Mar 19, 1945 DOA: 01/30/2020 PCP: [name], MD  Brief Narrative:HPI: Gary Butler is a 74/M chronically ill with history significant of OSA; psoriatic arthritis; morbid obesity (BMI 40); HTN; DM; BPH, chronic diastolic CHF (0000000 echo with preserved EF and grade 2 diastolic dysfunction) presentedwith nausea and anorexia.   -prior admission from 9/27-10/5 with perirectal abscess s/p I&D. he also had new onset afib and was started on Eliquis. --Last admitted from 10/9-13 with hematochezia; this was felt to be related to recent perineal surgery site bleeding and Eliquis was resumed at hospital d/c.  He was discharged to [facility] for rehab and was discharged to home in late November.   He has recently been referred to palliative care as an outpatient.  - He has had 3 recent ER visits to [facility] hospital most recently on 12/10.  He reported 8 months of anorexia, nausea and post-meal emesis.   -Progressively worsened, has been on ice chips for several weeks, lost about 100 pounds of weight  ED Course:  In the ED noted to have A. fib RVR, severe hypokalemia -CT abdomen pelvis concerning for emphysematous/gangrenous cholecystitis -Seen by general surgery and cardiology for clearance  Assessment & Plan:   Sepsis Gangrenous/emphysematous cholecystitis -General surgery following, and seen by cardiology for clearance -Ongoing lingering symptoms for 6 to 8 months, suspect acute on chronic cholecystitis HIDA scan abnormal. Status post laparoscopic cholecystectomy on 02/02/2020 by Dr. Grandville Silos. Continue IV antibiotics per general surgery. Management per general surgery.  Recent perirectal abscess  -With perirectal thickening on CT -Antibiotics as above, I wonder if this is unexpected in the setting of recent abscess and surgery  A. fib with RVR Chronic diastolic CHF -Continue Cardizem drip -Continue bisoprolol as tolerated -Held  heparin in the setting of gangrenous cholecystitis and risk of intra-abdominal bleeding, discussed with general surgery, they have cleared him for heparin. Will consult pharmacy to start heparin.  Type 2 diabetes mellitus -Jardiance on hold, continue sliding scale insulin.  Blood sugar controlled.  Mild dementia/cognitive deficits: Noted however patient seems to be completely alert and oriented.  Morbid obesity Obstructive sleep apnea -Has lost 100 pounds and needs continued weight loss -Has not been using BiPAP/CPAP following weight loss  Goals of care DO NOT RESUSCITATE -Followed by palliative care, not under hospice  DVT prophylaxis: Heparin subcu Code Status: DNR Family Communication: No family at bedside  Disposition Plan:  Status is: Inpatient  Remains inpatient appropriate because:Inpatient level of care appropriate due to severity of illness   Dispo: The patient is from: Home              Anticipated d/c is to: May need rehab              Anticipated d/c date is: > 3 days, once cleared by general surgery              Patient currently is not medically stable to d/c.  Consultants:   General surgery  Cardiology   Procedures:   Antimicrobials:    Subjective: Patient seen and examined.  He was complaining of generalized weakness.  No other complaint.  Objective: Vitals:   02/02/20 2116 02/03/20 0100 02/03/20 0602 02/03/20 1002  BP: 105/67 104/73 98/77 104/80  Pulse: 93 90 99 89  Resp: 18 16 18 18   Temp: (!) 97.4 F (36.3 C) 97.6 F (36.4 C) 97.9 F (36.6 C) (!) 97.5 F (36.4 C)  TempSrc: Axillary Oral Oral Oral  SpO2: 93% 96% 96%   Weight:      Height:        Intake/Output Summary (Last 24 hours) at 02/03/2020 1117 Last data filed at 02/03/2020 1004 Gross per 24 hour  Intake 650 ml  Output 60 ml  Net 590 ml   Filed Weights   01/30/20 1207 01/30/20 2107 02/02/20 0500  Weight: 131.5 kg 118.4 kg 119.6 kg    Examination:  General exam:    General exam: Appears calm and comfortable, morbidly obese Respiratory system: Diminished breath sounds at the bases bilaterally. Respiratory effort normal. Cardiovascular system: S1 & S2 heard, irregularly irregular rate and rhythm. No JVD, murmurs, rubs, gallops or clicks.  Trace pitting edema bilateral lower extremity Gastrointestinal system: Abdomen is nondistended, soft and mild to moderate generalized tenderness, more pronounced at right upper quadrant and epigastrium, no organomegaly or masses felt. Normal bowel sounds heard. Central nervous system: Alert and oriented. No focal neurological deficits. Extremities: Symmetric 5 x 5 power. Skin: No rashes, lesions or ulcers.  Psychiatry: Judgement and insight appear normal. Mood & affect appropriate.  Data Reviewed:   CBC: Recent Labs  Lab 01/30/20 1246 01/31/20 0346 02/01/20 0635 02/02/20 0123 02/03/20 0415  WBC 6.1 4.9 4.5 4.3 6.0  HGB 16.0 14.3 13.6 13.9 13.6  HCT 47.5 42.3 40.4 40.9 40.4  MCV 90.1 90.0 90.6 90.1 89.8  PLT 139* 116* 111* 96* 94*   Basic Metabolic Panel: Recent Labs  Lab 01/30/20 1246 01/31/20 0346 02/01/20 0635 02/02/20 0123 02/03/20 0415  NA 137 138 137 138 137  K 2.7* 2.8* 3.2* 3.5 3.7  CL 97* 101 105 106 106  CO2 21* 19* 19* 18* 19*  GLUCOSE 186* 167* 131* 123* 177*  BUN 13 13 11 13 10   CREATININE 1.19 0.97 0.90 0.88 0.89  CALCIUM 9.7 8.9 8.5* 8.5* 8.5*  MG 1.5*  --   --   --   --    GFR: Estimated Creatinine Clearance: 94.3 mL/min (by C-G formula based on SCr of 0.89 mg/dL). Liver Function Tests: Recent Labs  Lab 01/30/20 1246 01/31/20 0346 02/01/20 0635 02/02/20 0123 02/03/20 0415  AST 13* 10* 11* 9* 30  ALT 11 10 9 8 16   ALKPHOS 71 61 58 60 56  BILITOT 3.0* 1.8* 1.6* 1.5* 1.5*  PROT 5.2* 4.3* 4.3* 4.1* 4.3*  ALBUMIN 2.6* 2.2* 2.1* 2.1* 2.2*   Recent Labs  Lab 01/30/20 1246  LIPASE 48   No results for input(s): AMMONIA in the last 168 hours. Coagulation Profile: Recent  Labs  Lab 01/30/20 1246  INR 1.6*   Cardiac Enzymes: No results for input(s): CKTOTAL, CKMB, CKMBINDEX, TROPONINI in the last 168 hours. BNP (last 3 results) No results for input(s): PROBNP in the last 8760 hours. HbA1C: No results for input(s): HGBA1C in the last 72 hours. CBG: Recent Labs  Lab 02/02/20 0617 02/02/20 0959 02/02/20 1634 02/02/20 2100 02/03/20 0659  GLUCAP 111* 132* 176* 157* 173*   Lipid Profile: No results for input(s): CHOL, HDL, LDLCALC, TRIG, CHOLHDL, LDLDIRECT in the last 72 hours. Thyroid Function Tests: No results for input(s): TSH, T4TOTAL, FREET4, T3FREE, THYROIDAB in the last 72 hours. Anemia Panel: No results for input(s): VITAMINB12, FOLATE, FERRITIN, TIBC, IRON, RETICCTPCT in the last 72 hours. Urine analysis:    Component Value Date/Time   COLORURINE YELLOW 11/09/2019 1937   APPEARANCEUR CLEAR 11/09/2019 1937   LABSPEC <1.005 (L) 11/09/2019 1937   PHURINE 5.5 11/09/2019 1937  GLUCOSEU >=500 (A) 11/09/2019 1937   HGBUR NEGATIVE 11/09/2019 1937   BILIRUBINUR NEGATIVE 11/09/2019 1937   KETONESUR NEGATIVE 11/09/2019 1937   PROTEINUR NEGATIVE 11/09/2019 1937   UROBILINOGEN 1.0 06/10/2013 1000   NITRITE NEGATIVE 11/09/2019 1937   LEUKOCYTESUR NEGATIVE 11/09/2019 1937   Sepsis Labs: @LABRCNTIP (procalcitonin:4,lacticidven:4)  ) Recent Results (from the past 240 hour(s))  Resp Panel by RT-PCR (Flu A&B, Covid) Nasopharyngeal Swab     Status: None   Collection Time: 01/30/20 12:38 PM   Specimen: Nasopharyngeal Swab; Nasopharyngeal(NP) swabs in vial transport medium  Result Value Ref Range Status   SARS Coronavirus 2 by RT PCR NEGATIVE NEGATIVE Final    Comment: (NOTE) SARS-CoV-2 target nucleic acids are NOT DETECTED.  The SARS-CoV-2 RNA is generally detectable in upper respiratory specimens during the acute phase of infection. The lowest concentration of SARS-CoV-2 viral copies this assay can detect is 138 copies/mL. A negative result  does not preclude SARS-Cov-2 infection and should not be used as the sole basis for treatment or other patient management decisions. A negative result may occur with  improper specimen collection/handling, submission of specimen other than nasopharyngeal swab, presence of viral mutation(s) within the areas targeted by this assay, and inadequate number of viral copies(<138 copies/mL). A negative result must be combined with clinical observations, patient history, and epidemiological information. The expected result is Negative.  Fact Sheet for Patients:  EntrepreneurPulse.com.au  Fact Sheet for Healthcare Providers:  IncredibleEmployment.be  This test is no t yet approved or cleared by the [place] FDA and  has been authorized for detection and/or diagnosis of SARS-CoV-2 by FDA under an Emergency Use Authorization (EUA). This EUA will remain  in effect (meaning this test can be used) for the duration of the COVID-19 declaration under Section 564(b)(1) of the Act, 21 U.S.C.section 360bbb-3(b)(1), unless the authorization is terminated  or revoked sooner.       Influenza A by PCR NEGATIVE NEGATIVE Final   Influenza B by PCR NEGATIVE NEGATIVE Final    Comment: (NOTE) The Xpert Xpress SARS-CoV-2/FLU/RSV plus assay is intended as an aid in the diagnosis of influenza from Nasopharyngeal swab specimens and should not be used as a sole basis for treatment. Nasal washings and aspirates are unacceptable for Xpert Xpress SARS-CoV-2/FLU/RSV testing.  Fact Sheet for Patients: EntrepreneurPulse.com.au  Fact Sheet for Healthcare Providers: IncredibleEmployment.be  This test is not yet approved or cleared by the [place] FDA and has been authorized for detection and/or diagnosis of SARS-CoV-2 by FDA under an Emergency Use Authorization (EUA). This EUA will remain in effect (meaning this test can be used) for  the duration of the COVID-19 declaration under Section 564(b)(1) of the Act, 21 U.S.C. section 360bbb-3(b)(1), unless the authorization is terminated or revoked.  Performed at [facility], [place] [address], [place], [place] [address]   Culture, blood (routine x 2)     Status: None (Preliminary result)   Collection Time: 01/30/20  4:40 PM   Specimen: BLOOD RIGHT FOREARM  Result Value Ref Range Status   Specimen Description BLOOD RIGHT FOREARM  Final   Special Requests   Final    BOTTLES DRAWN AEROBIC AND ANAEROBIC Blood Culture results may not be optimal due to an inadequate volume of blood received in culture bottles   Culture   Final    NO GROWTH 3 DAYS Performed at [facility], [place] [address], [place], [place] [address]    Report Status PENDING  Incomplete  Culture, blood (  routine x 2)     Status: None (Preliminary result)   Collection Time: 01/30/20  9:54 PM   Specimen: BLOOD  Result Value Ref Range Status   Specimen Description BLOOD LEFT HAND  Final   Special Requests   Final    BOTTLES DRAWN AEROBIC ONLY Blood Culture adequate volume   Culture   Final    NO GROWTH 3 DAYS Performed at [facility], [address] [place], [place] [address]    Report Status PENDING  Incomplete  MRSA PCR Screening     Status: None   Collection Time: 01/31/20  7:34 AM   Specimen: Nasal Mucosa; Nasopharyngeal  Result Value Ref Range Status   MRSA by PCR NEGATIVE NEGATIVE Final    Comment:        The GeneXpert MRSA Assay (FDA approved for NASAL specimens only), is one component of a comprehensive MRSA colonization surveillance program. It is not intended to diagnose MRSA infection nor to guide or monitor treatment for MRSA infections. Performed at [facility], [place] [address] [place], [place] [address]   Surgical pcr screen     Status: Abnormal   Collection Time: 02/02/20  6:06 AM   Specimen: Nasal Mucosa; Nasal Swab  Result Value Ref Range Status   MRSA,  PCR NEGATIVE NEGATIVE Final   Staphylococcus aureus POSITIVE (A) NEGATIVE Final    Comment: (NOTE) The Xpert SA Assay (FDA approved for NASAL specimens in patients 67 years of age and older), is one component of a comprehensive surveillance program. It is not intended to diagnose infection nor to guide or monitor treatment. Performed at [facility], [place] [address], [place], [place] [address]     Radiology Studies: NM Hepatobiliary Liver Func  Result Date: 02/01/2020 CLINICAL DATA:  Upper abdominal pain, nausea and vomiting EXAM: NUCLEAR MEDICINE HEPATOBILIARY IMAGING TECHNIQUE: Sequential images of the abdomen were obtained out to 60 minutes following intravenous administration of radiopharmaceutical. RADIOPHARMACEUTICALS:  4.91 mCi Tc-61m Choletec IV, 2 mg morphine given intravenously COMPARISON:  01/30/2020 FINDINGS: There is prompt uptake of radiotracer by the hepatic parenchyma. Activity is seen within the common bile duct by 25 minutes. Activity is seen within the small bowel by 30 minutes. At 90 minutes, the gallbladder is still not visualized. 2 mg of morphine was given and subsequent imaging was performed for 30 minutes. The gallbladder is not visualized by the conclusion of the exam. IMPRESSION: 1. Nonvisualization of the gallbladder despite morphine administration, consistent with acute cholecystitis and cystic duct obstruction. Electronically Signed   By: Randa Ngo M.D.   On: 02/01/2020 21:38   Scheduled Meds: . bisoprolol  5 mg Oral Daily  . Chlorhexidine Gluconate Cloth  6 each Topical Q0600  . feeding supplement (GLUCERNA SHAKE)  237 mL Oral TID BM  . heparin injection (subcutaneous)  5,000 Units Subcutaneous Q8H  . insulin aspart  0-15 Units Subcutaneous TID WC  . insulin aspart  0-5 Units Subcutaneous QHS  . mupirocin ointment  1 application Nasal BID  . sodium chloride flush  3 mL Intravenous Q12H   Continuous Infusions: . sodium chloride 100 mL/hr at 02/03/20  0133  . sodium chloride 250 mL (02/02/20 2328)  . ceFEPime (MAXIPIME) IV 2 g (02/03/20 0552)   And  . metronidazole 500 mg (02/03/20 0706)  . diltiazem (CARDIZEM) infusion 15 mg/hr (02/03/20 0548)     LOS: 4 days   Time spent: 100 min  Darliss Cheney, MD [facility]  02/03/2020, 11:17 AM

## 2020-02-03 NOTE — Progress Notes (Signed)
[place] for Heparin Indication: atrial fibrillation  Allergies  Allergen Reactions  . Penicillins Hives    Patient reports full body hives that required medical treatment when he was in his 61s or 66s. He tolerated cephalosporins.   [name] [Pregabalin] Swelling  . Levofloxacin Swelling    Patient Measurements: Height: 5\' 11"  (180.3 cm) Weight: 119.6 kg (263 lb 10.7 oz) IBW/kg (Calculated) : 75.3 Heparin Dosing Weight: 101.8 kg  Vital Signs: Temp: 97.8 F (36.6 C) (12/22 2237) Temp Source: Oral (12/22 2237) BP: 92/70 (12/22 2237) Pulse Rate: 80 (12/22 2237)  Labs: Recent Labs    02/01/20 0635 02/02/20 0123 02/03/20 0415 02/03/20 2152  HGB 13.6 13.9 13.6  --   HCT 40.4 40.9 40.4  --   PLT 111* 96* 94*  --   HEPARINUNFRC  --   --   --  0.50  CREATININE 0.90 0.88 0.89  --     Estimated Creatinine Clearance: 94.3 mL/min (by C-G formula based on SCr of 0.89 mg/dL).   Assessment: Patient is a 48 yom that is being admitted for Nausea and emphysematous cholecystitis. The patient also presented with Afivb with RVR. Patient was previously on Apixaban prior to admission and rate control medications however stopped the Apixaban secondary to  bleeding.  Was started on IV heparin infusion on 12/18 which was discontinued on the next day 12/19 due to severe gangrenous cholecystitis and risk of bleeding . Transitioned to SQ Heparin for VTE prophylaxis.  Now s/p lap cholecystectomy, cholangiogram on 12/21, POD#1 Dr. Doristine Bosworth has discussed with general surgery who has cleared patient for heparin.   Pharmacy has been consulted on 02/03/20 to restart IV heparin for Atrial fibrillation.  H/H within normal, stable.  Thrombocytopenia:  PLTC 139 on admssion>>111>96>94k  Heparin level 0.5 (therapeutic) on gtt at 1250 units/hr. No bleeding noted.  Goal of Therapy:  Heparin level 0.3-0.7 units/ml Monitor platelets by anticoagulation protocol: Yes    Plan:  Continue Heparin drip @ 1250 units/hr Daily Heparin level and CBC  Sherlon Handing, PharmD, BCPS Please see amion for complete clinical pharmacist phone list 02/03/2020,11:22 PM

## 2020-02-03 NOTE — Progress Notes (Addendum)
[facility] Progress Note  1 Day Post-Op  Subjective: CC-  Abdomen sore this morning. Mild nausea, no emesis. Denies bloating. No flatus or BM. Has not gotten OOB since [facility].  Objective: Vital signs in last 24 hours: Temp:  [97.3 F (36.3 C)-97.9 F (36.6 C)] 97.9 F (36.6 C) (12/22 0602) Pulse Rate:  [90-144] 99 (12/22 0602) Resp:  [15-34] 18 (12/22 0602) BP: (96-121)/(67-90) 98/77 (12/22 0602) SpO2:  [93 %-99 %] 96 % (12/22 0602) Last BM Date: 02/01/20  Intake/Output from previous day: 12/21 0701 - 12/22 0700 In: 650 [I.V.:400; IV Piggyback:250] Out: 10 [Blood:10] Intake/Output this shift: No intake/output data recorded.  PE: Gen:  Alert, NAD, pleasant Pulm: rate and effort normal Abd: obese, soft, not grossly distended, appropriately tender RUQ and over incisions, few BS heard, lap incisions C/D/I  Lab Results:  Recent Labs    02/02/20 0123 02/03/20 0415  WBC 4.3 6.0  HGB 13.9 13.6  HCT 40.9 40.4  PLT 96* 94*   BMET Recent Labs    02/02/20 0123 02/03/20 0415  NA 138 137  K 3.5 3.7  CL 106 106  CO2 18* 19*  GLUCOSE 123* 177*  BUN 13 10  CREATININE 0.88 0.89  CALCIUM 8.5* 8.5*   PT/INR No results for input(s): LABPROT, INR in the last 72 hours. CMP     Component Value Date/Time   NA 137 02/03/2020 0415   NA 140 12/02/2019 0000   NA 139 06/17/2013 0946   K 3.7 02/03/2020 0415   K 3.5 06/17/2013 0946   CL 106 02/03/2020 0415   CL 102 05/14/2012 0856   CO2 19 (L) 02/03/2020 0415   CO2 27 06/17/2013 0946   GLUCOSE 177 (H) 02/03/2020 0415   GLUCOSE 184 (H) 06/17/2013 0946   GLUCOSE 135 (H) 05/14/2012 0856   BUN 10 02/03/2020 0415   BUN 5 12/02/2019 0000   BUN 13.8 06/17/2013 0946   CREATININE 0.89 02/03/2020 0415   CREATININE 0.95 07/14/2015 1019   CREATININE 1.1 06/17/2013 0946   CALCIUM 8.5 (L) 02/03/2020 0415   CALCIUM 9.3 06/17/2013 0946   PROT 4.3 (L) 02/03/2020 0415   PROT 5.7 (L) 06/17/2013 0946   ALBUMIN 2.2 (L)  02/03/2020 0415   ALBUMIN 3.2 (L) 06/17/2013 0946   AST 30 02/03/2020 0415   AST 19 06/17/2013 0946   ALT 16 02/03/2020 0415   ALT 18 06/17/2013 0946   ALKPHOS 56 02/03/2020 0415   ALKPHOS 101 06/17/2013 0946   BILITOT 1.5 (H) 02/03/2020 0415   BILITOT 1.54 (H) 06/17/2013 0946   GFRNONAA >60 02/03/2020 0415   GFRAA >90 12/02/2019 0000   Lipase     Component Value Date/Time   LIPASE 48 01/30/2020 1246       Studies/Results: NM Hepatobiliary Liver Func  Result Date: 02/01/2020 CLINICAL DATA:  Upper abdominal pain, nausea and vomiting EXAM: NUCLEAR MEDICINE HEPATOBILIARY IMAGING TECHNIQUE: Sequential images of the abdomen were obtained out to 60 minutes following intravenous administration of radiopharmaceutical. RADIOPHARMACEUTICALS:  4.91 mCi Tc-5268m Choletec IV, 2 mg morphine given intravenously COMPARISON:  01/30/2020 FINDINGS: There is prompt uptake of radiotracer by the hepatic parenchyma. Activity is seen within the common bile duct by 25 minutes. Activity is seen within the small bowel by 30 minutes. At 90 minutes, the gallbladder is still not visualized. 2 mg of morphine was given and subsequent imaging was performed for 30 minutes. The gallbladder is not visualized by the conclusion of the exam. IMPRESSION: 1. Nonvisualization of  the gallbladder despite morphine administration, consistent with acute cholecystitis and cystic duct obstruction. Electronically Signed   By: Randa Ngo M.D.   On: 02/01/2020 21:38    Anti-infectives: Anti-infectives (From admission, onward)   Start     Dose/Rate Route Frequency Ordered Stop   01/31/20 1400  ceFEPIme (MAXIPIME) 2 g in sodium chloride 0.9 % 100 mL IVPB       "And" Linked Group Details   2 g 200 mL/hr over 30 Minutes Intravenous Every 8 hours 01/31/20 1158     01/31/20 1245  metroNIDAZOLE (FLAGYL) IVPB 500 mg       "And" Linked Group Details   500 mg 100 mL/hr over 60 Minutes Intravenous Every 8 hours 01/31/20 1158      01/30/20 1445  ceFEPIme (MAXIPIME) 2 g in sodium chloride 0.9 % 100 mL IVPB       "And" Linked Group Details   2 g 200 mL/hr over 30 Minutes Intravenous  Once 01/30/20 1438 01/30/20 1856   01/30/20 1445  metroNIDAZOLE (FLAGYL) IVPB 500 mg       "And" Linked Group Details   500 mg 100 mL/hr over 60 Minutes Intravenous  Once 01/30/20 1438 01/30/20 1856       Assessment/Plan A Fib with RVR - on cardizem drip Chronic diastolic CHF DM Morbid obesity BMI 36.77 OSA Code status DNR  Cholecystitis S/p laparoscopic cholecystectomy 12/21 Dr. Grandville Silos - POD#1 - WBC 6, afebrile - Tbili stable at 1.5, recheck CMP in AM - Advance diet as tolerated - Add oral pain medications - Needs to mobilize, PT/OT consults pending - continue antibiotics for a couple days postop while here but does not need to go home with any  - ok to restart anticoagulation from our standpoint  ID - maxipime/flagyl 12/18>> FEN - IVF, FLD ADAT VTE - SCDs, sq heparin (recommend holding this in the setting of thrombocytopenia) Foley - none Follow up - [facility] clinic   LOS: 4 days    Gary [facility], [facility] 02/03/2020, 9:41 AM Please see Amion for pager number during day hours 7:00am-4:30pm

## 2020-02-03 NOTE — Progress Notes (Signed)
RT note. Pt. Refused CPAP for tonight. RT will continue to monitor.

## 2020-02-03 NOTE — Progress Notes (Signed)
[facility] for Heparin Indication: atrial fibrillation  Allergies  Allergen Reactions  . Penicillins Hives    Patient reports full body hives that required medical treatment when he was in his 19s or 65s. He tolerated cephalosporins.   [name] [Pregabalin] Swelling  . Levofloxacin Swelling    Patient Measurements: Height: 5\' 11"  (180.3 cm) Weight: 119.6 kg (263 lb 10.7 oz) IBW/kg (Calculated) : 75.3 Heparin Dosing Weight: 101.8 kg  Vital Signs: Temp: 97.5 F (36.4 C) (12/22 1002) Temp Source: Oral (12/22 1002) BP: 104/80 (12/22 1002) Pulse Rate: 89 (12/22 1002)  Labs: Recent Labs    02/01/20 0635 02/02/20 0123 02/03/20 0415  HGB 13.6 13.9 13.6  HCT 40.4 40.9 40.4  PLT 111* 96* 94*  CREATININE 0.90 0.88 0.89    Estimated Creatinine Clearance: 94.3 mL/min (by C-G formula based on SCr of 0.89 mg/dL).   Medical History: Past Medical History:  Diagnosis Date  . Arthritis   . CHF (congestive heart failure) ([place])   . Constipation   . Diabetes mellitus   . Heart murmur   . History of gout   . History of skin cancer   . Hypertension   . Morbid (severe) obesity due to excess calories ([place])   . OSA (obstructive sleep apnea)   . Psoriatic arthritis ([place])   . Rectal fissure   . Tinnitus     Medications:  Scheduled:  . atorvastatin  40 mg Oral Daily  . bisoprolol  5 mg Oral Daily  . Chlorhexidine Gluconate Cloth  6 each Topical Q0600  . feeding supplement (GLUCERNA SHAKE)  237 mL Oral TID BM  . heparin injection (subcutaneous)  5,000 Units Subcutaneous Q8H  . insulin aspart  0-15 Units Subcutaneous TID WC  . insulin aspart  0-5 Units Subcutaneous QHS  . mupirocin ointment  1 application Nasal BID  . sodium chloride flush  3 mL Intravenous Q12H  . tamsulosin  0.4 mg Oral Daily    Assessment: Patient is a 87 yom that is being admitted for Nausea and emphysematous cholecystitis. The patient also presented with Afivb with  RVR. Patient was previously on Apixaban prior to admission and rate control medications however stopped the Apixaban secondary to  bleeding.  Was started on IV heparin infusion on 12/18 which was discontinued on the next day 12/19 due to severe gangrenous cholecystitis and risk of bleeding . Transitioned to SQ Heparin for VTE prophylaxis.  Now s/p lap cholecystectomy, cholangiogram on 12/21, POD#1 Dr. Doristine Bosworth has discussed with general surgery who has cleared patient for heparin.   Pharmacy has been consulted on 02/03/20 to restart IV heparin for Atrial fibrillation.  H/H within normal, stable.  Thrombocytopenia:  PLTC 139 on admssion>>111>96>94k  Received SQ heparin 5000 units this AM ~0600 02/03/20.   Goal of Therapy:  Heparin level 0.3-0.7 units/ml Monitor platelets by anticoagulation protocol: Yes   Plan:  Discontinue sq heparin.  Start IV Heparin drip @ 1250 units/hr Heparin level in ~ 8 hours  Daily Heparin level and CBC Monitor patient for s/s of bleeding and CBC while on heparin   Nicole Cella, RPh Clinical Pharmacist [phone] Please check AMION for all [place] phone numbers After 10:00 PM, call [place] [phone]  02/03/2020,11:24 AM

## 2020-02-03 NOTE — Progress Notes (Signed)
Pharmacy Antibiotic Note  Gary [facility] is a 74 y.o. male admitted on 01/30/2020 with severe gangrenous cholecystits.  Pharmacy consulted 12/19 for cefepime and flagyl dosing. Currently receiving Cefepime 2g IV every 8 hours and Flagyl 500 mg IV every 8 hours, since 12/18.  Now s/p lap cholecystectomy, cholangiogram on 12/21. IV antibiotics continue.  SCr remains <1, CrCL >60 ml/min. Afebrile and normal wbc.  Plan:   Continue IV cefepime 2g q8hr IV, flagyl 500mg  q8hr  Monitor clinical status, renal function and culture results daily.    Height: 5\' 11"  (180.3 cm) Weight: 119.6 kg (263 lb 10.7 oz) IBW/kg (Calculated) : 75.3  Temp (24hrs), Avg:97.6 F (36.4 C), Min:97.3 F (36.3 C), Max:97.9 F (36.6 C)  Recent Labs  Lab 01/30/20 1246 01/30/20 1338 01/30/20 1640 01/31/20 0346 02/01/20 0635 02/02/20 0123 02/03/20 0415  WBC 6.1  --   --  4.9 4.5 4.3 6.0  CREATININE 1.19  --   --  0.97 0.90 0.88 0.89  LATICACIDVEN  --  2.6* 3.3*  --   --   --   --     Estimated Creatinine Clearance: 94.3 mL/min (by C-G formula based on SCr of 0.89 mg/dL).    Allergies  Allergen Reactions  . Penicillins Hives    Patient reports full body hives that required medical treatment when he was in his 97s or 76s. He tolerated cephalosporins.   [name] [Pregabalin] Swelling  . Levofloxacin Swelling    Antimicrobials this admission: Cefepime 12/18 >> Flagyl 12/18 >>  Dose adjustments this admission: None to date  Microbiology results: 12/18 blood cultures x2: ngtd x3d 12/19 MRSA PCR: neg 12/18 Covid: neg;  Flu: neg  Thank you for allowing pharmacy to be a part of this patient's care.  Nicole Cella, RPh Clinical Pharmacist [phone] Please check AMION for all [place] phone numbers After 10:00 PM, call [place] [phone] 02/03/2020 12:03 PM

## 2020-02-04 ENCOUNTER — Inpatient Hospital Stay (HOSPITAL_COMMUNITY): Payer: Medicare PPO

## 2020-02-04 DIAGNOSIS — M7989 Other specified soft tissue disorders: Secondary | ICD-10-CM

## 2020-02-04 LAB — GLUCOSE, CAPILLARY
Glucose-Capillary: 143 mg/dL — ABNORMAL HIGH (ref 70–99)
Glucose-Capillary: 121 mg/dL — ABNORMAL HIGH (ref 70–99)
Glucose-Capillary: 135 mg/dL — ABNORMAL HIGH (ref 70–99)
Glucose-Capillary: 122 mg/dL — ABNORMAL HIGH (ref 70–99)

## 2020-02-04 LAB — COMPREHENSIVE METABOLIC PANEL
ALT: 15 U/L (ref 0–44)
AST: 21 U/L (ref 15–41)
Albumin: 1.9 g/dL — ABNORMAL LOW (ref 3.5–5.0)
Alkaline Phosphatase: 49 U/L (ref 38–126)
Anion gap: 14 (ref 5–15)
BUN: 8 mg/dL (ref 8–23)
CO2: 19 mmol/L — ABNORMAL LOW (ref 22–32)
Calcium: 8.3 mg/dL — ABNORMAL LOW (ref 8.9–10.3)
Chloride: 105 mmol/L (ref 98–111)
Creatinine, Ser: 0.72 mg/dL (ref 0.61–1.24)
GFR, Estimated: >60 mL/min (ref >60)
Glucose, Bld: 139 mg/dL — ABNORMAL HIGH (ref 70–99)
Potassium: 3.1 mmol/L — ABNORMAL LOW (ref 3.5–5.1)
Sodium: 138 mmol/L (ref 135–145)
Total Bilirubin: 1 mg/dL (ref 0.3–1.2)
Total Protein: 3.8 g/dL — ABNORMAL LOW (ref 6.5–8.1)

## 2020-02-04 LAB — CBC
HCT: 35.2 % — ABNORMAL LOW (ref 39.0–52.0)
Hemoglobin: 12.4 g/dL — ABNORMAL LOW (ref 13.0–17.0)
MCH: 31.1 pg (ref 26.0–34.0)
MCHC: 35.2 g/dL (ref 30.0–36.0)
MCV: 88.2 fL (ref 80.0–100.0)
Platelets: 81 10*3/uL — ABNORMAL LOW (ref 150–400)
RBC: 3.99 MIL/uL — ABNORMAL LOW (ref 4.22–5.81)
RDW: 16 % — ABNORMAL HIGH (ref 11.5–15.5)
WBC: 5.3 10*3/uL (ref 4.0–10.5)
nRBC: 0.4 % — ABNORMAL HIGH (ref 0.0–0.2)

## 2020-02-04 LAB — CULTURE, BLOOD (ROUTINE X 2)
Culture: NO GROWTH
Culture: NO GROWTH
Special Requests: ADEQUATE

## 2020-02-04 LAB — MAGNESIUM: Magnesium: 1.6 mg/dL — ABNORMAL LOW (ref 1.7–2.4)

## 2020-02-04 LAB — HEPARIN LEVEL (UNFRACTIONATED): Heparin Unfractionated: 0.58 IU/mL (ref 0.30–0.70)

## 2020-02-04 MED ORDER — MECLIZINE HCL 12.5 MG PO TABS
12.5000 mg | ORAL_TABLET | Freq: Three times a day (TID) | ORAL | Status: DC | PRN
  Administered 2020-02-08: 12.5 mg via ORAL
  Filled 2020-02-04 (×3): qty 1

## 2020-02-04 MED ORDER — MAGNESIUM SULFATE 2 GM/50ML IV SOLN
2.0000 g | Freq: Once | INTRAVENOUS | Status: AC
  Administered 2020-02-04: 2 g via INTRAVENOUS
  Filled 2020-02-04: qty 50

## 2020-02-04 MED ORDER — FUROSEMIDE 10 MG/ML IJ SOLN
40.0000 mg | Freq: Once | INTRAMUSCULAR | Status: AC
  Administered 2020-02-04: 40 mg via INTRAVENOUS
  Filled 2020-02-04: qty 4

## 2020-02-04 MED ORDER — POTASSIUM CHLORIDE CRYS ER 20 MEQ PO TBCR
40.0000 meq | EXTENDED_RELEASE_TABLET | Freq: Two times a day (BID) | ORAL | Status: AC
  Administered 2020-02-04 (×2): 40 meq via ORAL
  Filled 2020-02-04 (×2): qty 2

## 2020-02-04 MED ORDER — DILTIAZEM HCL ER COATED BEADS 120 MG PO CP24
120.0000 mg | ORAL_CAPSULE | Freq: Every day | ORAL | Status: DC
  Administered 2020-02-04 – 2020-02-06 (×3): 120 mg via ORAL
  Filled 2020-02-04 (×3): qty 1

## 2020-02-04 MED ORDER — MAGNESIUM OXIDE 400 (241.3 MG) MG PO TABS
400.0000 mg | ORAL_TABLET | Freq: Two times a day (BID) | ORAL | Status: AC
  Administered 2020-02-04 (×2): 400 mg via ORAL
  Filled 2020-02-04 (×2): qty 1

## 2020-02-04 MED ORDER — APIXABAN 5 MG PO TABS
5.0000 mg | ORAL_TABLET | Freq: Two times a day (BID) | ORAL | Status: DC
  Administered 2020-02-04 – 2020-02-09 (×11): 5 mg via ORAL
  Filled 2020-02-04 (×11): qty 1

## 2020-02-04 NOTE — Progress Notes (Signed)
Physical Therapy Treatment Patient Details Name: Gary [facility] MRN: [id number] DOB: 1945/12/07 Today's Date: 02/04/2020    History of Present Illness Gary [facility] is a 74/M chronically ill with history significant of OSA; psoriatic arthritis; morbid obesity (BMI 40); HTN; DM; BPH, chronic diastolic CHF (05/345 echo with preserved EF and grade 2 diastolic dysfunction) presentedwith nausea and anorexia.    -prior admission from 9/27-10/5 with perirectal abscess s/p I&D. he also had new onset afib and was started on Eliquis.Ongoing lingering symptoms for 6 to 8 months, suspect acute on chronic cholecystitis.  Lap cholecystectomy 12/21.    PT Comments    Pt admitted with above diagnosis. Pt was able to tolerate vestibular testing with PT with possible horizontal canal involvement as well as left posterior canal involvment as well.  Treated with canalith repositioning maneuver for left posterior canal BPPV as pt was able to tolerate this maneuver.  Could not tolerate BBQ roll.  Text paged MD to ask for order for Meclizine or Antivert as pt will have difficulty with vestibular treatment due to abdominal incision and MD to address. Pt felt better after treatment and while he was sitting, washed pts back and he washed his face. Left him sitting up in bed with his breakfast.  Pt currently with functional limitations due to the deficits listed below (see PT Problem List). Pt will benefit from skilled PT to increase their independence and safety with mobility to allow discharge to the venue listed below.     Follow Up Recommendations  SNF;Supervision/Assistance - 24 hour     Equipment Recommendations  Wheelchair (measurements PT);Wheelchair cushion (measurements PT)    Recommendations for Other Services       Precautions / Restrictions Precautions Precautions: Fall Precaution Comments: + dizziness Restrictions Weight Bearing Restrictions: No    Mobility  Bed Mobility Overal bed mobility: Needs  Assistance Bed Mobility: Rolling;Sidelying to Sit;Sit to Sidelying Rolling: Mod assist;Min assist Sidelying to sit: +2 for physical assistance;Mod assist     Sit to sidelying: Mod assist General bed mobility comments: mod to roll L, min to roll R, assist for LEs over EOB and to raise trunk, assisted LEs back in bed at end of session  Transfers Overall transfer level: Needs assistance Equipment used: Rolling walker (2 wheeled) Transfers: Sit to/from Stand Sit to Stand: +2 physical assistance;Mod assist;From elevated surface Stand pivot transfers: Min assist;+2 physical assistance       General transfer comment: cues for hand placement, assist to rise and steady, pt wiht poor activity tolerance with side stepping towards HOB, sat abruptly  Ambulation/Gait             General Gait Details: unable today   Stairs             Wheelchair Mobility    Modified Rankin (Stroke Patients Only)       Balance Overall balance assessment: Needs assistance Sitting-balance support: No upper extremity supported;Feet supported Sitting balance-Leahy Scale: Fair     Standing balance support: Bilateral upper extremity supported;During functional activity Standing balance-Leahy Scale: Poor Standing balance comment: relies on UE and external support                            Cognition Arousal/Alertness: Awake/alert Behavior During Therapy: WFL for tasks assessed/performed Overall Cognitive Status: Impaired/Different from baseline Area of Impairment: Following commands;Problem solving  Following Commands: Follows one step commands with increased time     Problem Solving: Slow processing;Decreased initiation;Difficulty sequencing;Requires verbal cues General Comments: pt agreeable to further rehab in SNF, agrees he is too weak to return home      Exercises      General Comments General comments (skin integrity, edema, etc.): Pt  with positive nystagmus and suspicious for horizontal canal as well as posterior canal.  Pt with abdominal incision and decided to treat for posterior canal first due to pt states he cannot roll on his belly.  Treated with canalith repositioning for left posterior canal BPPV.      Pertinent Vitals/Pain Pain Assessment: Faces Faces Pain Scale: Hurts even more Pain Location: R LE with touch Pain Descriptors / Indicators: Moaning Pain Intervention(s): Limited activity within patient's tolerance;Monitored during session;Repositioned    Home Living Family/patient expects to be discharged to:: Private residence Living Arrangements: Spouse/significant other Available Help at Discharge: Family;Available 24 hours/day Type of Home: House Home Access: Ramped entrance   Home Layout: One level Home Equipment: Walker - 2 wheels;Bedside commode;Cane - single point;Shower seat      Prior Function Level of Independence: Independent with assistive device(s)          PT Goals (current goals can now be found in the care plan section) Acute Rehab PT Goals Patient Stated Goal: to get stronger Progress towards PT goals: Progressing toward goals    Frequency    Min 2X/week      PT Plan Current plan remains appropriate;Frequency needs to be updated    Co-evaluation PT/OT/SLP Co-Evaluation/Treatment: Yes Reason for Co-Treatment: Complexity of the patient's impairments (multi-system involvement);For patient/therapist safety PT goals addressed during session: Mobility/safety with mobility OT goals addressed during session: ADL's and self-care      AM-PAC PT "6 Clicks" Mobility   Outcome Measure  Help needed turning from your back to your side while in a flat bed without using bedrails?: A Little Help needed moving from lying on your back to sitting on the side of a flat bed without using bedrails?: A Lot Help needed moving to and from a bed to a chair (including a wheelchair)?: Total Help  needed standing up from a chair using your arms (e.g., wheelchair or bedside chair)?: Total Help needed to walk in hospital room?: Total Help needed climbing 3-5 steps with a railing? : Total 6 Click Score: 9    End of Session Equipment Utilized During Treatment: Gait belt Activity Tolerance: Patient limited by fatigue Patient left: in chair;with call bell/phone within reach;with chair alarm set Nurse Communication: Mobility status PT Visit Diagnosis: Muscle weakness (generalized) (M62.81);Unsteadiness on feet (R26.81);Dizziness and giddiness (R42)     Time: [id number] PT Time Calculation (min) (ACUTE ONLY): 33 min  Charges:  $Canalith Rep Proc: 8-22 mins                     Vennessa Affinito W,PT Acute Rehabilitation Services Pager:  [phone]  Office:  [place] 02/04/2020, 1:48 PM

## 2020-02-04 NOTE — TOC Initial Note (Signed)
Transition of Care [facility]) - Initial/Assessment Note    Patient Details  Name: Gary [facility] MRN: [id number] Date of Birth: [birth year]  [facility]) CM/SW Contact:    Vinie Sill, [place] Phone Number: 02/04/2020, 4:46 PM  Clinical Narrative:                  CSW met with patient at bedside. CSW introduced self and explained role. CSW discussed PT recommendation of short term rehab. Patient states he has been to [facility] rehab no to long ago. Patient preference is [facility]. Patient acknowledges the need for rehab and is agreeable to SNF placement. CSW explained the SNF. CSW explained patient may be in co-pay status, can be up to 185.00 per day. Patient states he would be agreeable to pay for " a little while"  If he has used all of SNF days.   CSW has contacted [facility]- left voicemail- waiting on response.  CSW will provide bed offers once available CSW will continue to follow and assist with discharge planning.  Thurmond Butts, MSW, LCSW Clinical Social Worker    Expected Discharge Plan: Skilled Nursing Facility Barriers to Discharge: Insurance Authorization,Continued Medical Work up,SNF Pending bed offer (maybe copays status)   Patient Goals and CMS Choice        Expected Discharge Plan and Services Expected Discharge Plan: [place] In-house Referral: Clinical Social Work     Living arrangements for the past 2 months: Single Family Home                                      Prior Living Arrangements/Services Living arrangements for the past 2 months: Single Family Home Lives with:: [place] Patient language and need for interpreter reviewed:: No Do you feel safe going back to the place where you live?: Yes      Need for Family Participation in Patient Care: Yes (Comment) Care giver support system in place?: Yes (comment)   Criminal Activity/Legal Involvement Pertinent to Current  Situation/Hospitalization: No - Comment as needed  Activities of Daily Living      Permission Sought/Granted Permission sought to share information with : Family Supports Permission granted to share information with : Yes, Verbal Permission Granted  Share Information with NAME: Nigel Ericsson  Permission granted to share info w AGENCY: SNFs  Permission granted to share info w Relationship: spouse  Permission granted to share info w Contact Information: [phone]  Emotional Assessment Appearance:: Appears stated age Attitude/Demeanor/Rapport: Engaged Affect (typically observed): Accepting,Pleasant Orientation: : Oriented to Self,Oriented to Place,Oriented to  Time,Oriented to Situation Alcohol / Substance Use: Not Applicable Psych Involvement: No (comment)  Admission diagnosis:  Acute emphysematous cholecystitis [K81.0] Hypokalemia [E87.6] Hypomagnesemia [E83.42] Rectal inflammation [K62.89] Atrial fibrillation with RVR (HCC) [I48.91] Cholecystitis with gangrene of gallbladder [K82.A1] Patient Active Problem List   Diagnosis Date Noted  . Acute emphysematous cholecystitis 01/30/2020  . Sepsis due to undetermined organism ([place]) 01/30/2020  . Chronic diastolic congestive heart failure ([place]) 11/29/2019  . Bilateral primary osteoarthritis of knee 11/23/2019  . Hypercoagulable state due to atrial fibrillation ([place]) 11/23/2019  . Physical deconditioning 11/23/2019  . Chronic gout of multiple sites 11/23/2019  . Hematochezia 11/22/2019  . Lower GI bleed 11/22/2019  . Diverticulosis   . Acute urinary retention s/p Foley 11/10/2019 11/10/2019  . Suprasphincteric perirectal abscess s/p I&D 11/10/2019 11/10/2019  . Diarrhea 11/10/2019  .  Perineal abscess 11/10/2019  . GERD without esophagitis 11/10/2019  . Atrial fibrillation ([place]) 11/10/2019  . Poorly controlled diabetes mellitus ([place]) 11/09/2019  . Dyspnea on exertion 02/19/2017  . Bilateral lower extremity edema 07/06/2015  . CHF  (congestive heart failure) ([place]) 06/27/2015  . Fungal dermatitis 06/27/2015  . Edema 06/20/2015  . Increasing shortness of breath 06/20/2015  . Lumbar radiculopathy, acute 03/09/2015  . Essential hypertension 12/27/2014  . Uncontrolled type 2 diabetes mellitus with hyperglycemia, with long-term current use of insulin ([place]) 12/27/2014  . Mild aortic stenosis 06/28/2014  . Memory loss 04/29/2014  . Tinnitus of both ears 02/17/2014  . Wellness examination 12/04/2013  . Constipation due to slow transit 08/18/2013  . S/P left TKA 06/22/2013  . Meniere disease 04/21/2013  . Benign paroxysmal positional vertigo 04/21/2013  . History of methicillin resistant staphylococcus aureus (MRSA) 2015  . Psoriatic arthritis ([place]) 05/14/2012  . General medical exam 01/28/2012  . Cellulitis of eyelid 09/25/2011  . Diverticulitis 06/20/2011  . Erythrocytosis 05/18/2011  . S/P TKR (total knee replacement) 12/31/2010  . Obesity, Class III, BMI 40-49.9 (morbid obesity) ([place]) 12/31/2010  . Hyperlipidemia 12/22/2010  . BPH (benign prostatic hyperplasia) 12/22/2010  . Gout 12/22/2010  . OSA on CPAP 04/04/2009   PCP:  [name], MD Pharmacy:   [facility] [address] [address] Phone: [phone] Fax: [phone]     Social Determinants of Health (SDOH) Interventions    Readmission Risk Interventions No flowsheet data found.

## 2020-02-04 NOTE — Consult Note (Signed)
   [facility] Inpatient Consult   02/04/2020  [name] 1945-06-03 [id number]  [facility] [ACO] Patient: Gary Butler PPO   Patient screened for high risk score for unplanned readmission score and for 3 hospitalizations in the past 6 months. Reviewed also  to check if potential [facility] service needs.  Review of patient's medical record reveals patient is being recommended for a skilled nursing facility level of care for transition.  Primary Care Provider is [name], MD this provider is listed to provide the transition of care [TOC] for post hospital follow up.  Plan:  Follow up with inpatient [facility] team for transition of care. Continue to follow progress and disposition to assess for post hospital care management needs. If patient goes to a skilled facility for Short term rehab his post hospital transitional needs will be met at the facility.  Please place a [facility] Care Management consult as appropriate and for questions contact:   Natividad Brood, RN BSN [facility] Liaison  [phone] business mobile phone Toll free office [phone]  Fax number: [phone] [email or web address]

## 2020-02-04 NOTE — NC FL2 (Signed)
[place] LEVEL OF CARE SCREENING TOOL     IDENTIFICATION  Patient Name: Gary Butler Birthdate: 1945-09-30 Sex: male Admission Date (Current Location): 01/30/2020  [facility] and [place] Number:  Herbalist and Address:  [facility], [place] [address], [place], [place] [address]      Provider Number: [id number]  Attending Physician Name and Address:  Darliss Cheney, MD  Relative Name and Phone Number:       Current Level of Care: Hospital Recommended Level of Care: [place] Prior Approval Number:    Date Approved/Denied:   PASRR Number: [id number]  Discharge Plan: SNF    Current Diagnoses: Patient Active Problem List   Diagnosis Date Noted  . Acute emphysematous cholecystitis 01/30/2020  . Sepsis due to undetermined organism ([place]) 01/30/2020  . Chronic diastolic congestive heart failure ([place]) 11/29/2019  . Bilateral primary osteoarthritis of knee 11/23/2019  . Hypercoagulable state due to atrial fibrillation ([place]) 11/23/2019  . Physical deconditioning 11/23/2019  . Chronic gout of multiple sites 11/23/2019  . Hematochezia 11/22/2019  . Lower GI bleed 11/22/2019  . Diverticulosis   . Acute urinary retention s/p Foley 11/10/2019 11/10/2019  . Suprasphincteric perirectal abscess s/p I&D 11/10/2019 11/10/2019  . Diarrhea 11/10/2019  . Perineal abscess 11/10/2019  . GERD without esophagitis 11/10/2019  . Atrial fibrillation ([place]) 11/10/2019  . Poorly controlled diabetes mellitus ([place]) 11/09/2019  . Dyspnea on exertion 02/19/2017  . Bilateral lower extremity edema 07/06/2015  . CHF (congestive heart failure) ([place]) 06/27/2015  . Fungal dermatitis 06/27/2015  . Edema 06/20/2015  . Increasing shortness of breath 06/20/2015  . Lumbar radiculopathy, acute 03/09/2015  . Essential hypertension 12/27/2014  . Uncontrolled type 2 diabetes mellitus with hyperglycemia, with long-term current use of insulin ([place]) 12/27/2014   . Mild aortic stenosis 06/28/2014  . Memory loss 04/29/2014  . Tinnitus of both ears 02/17/2014  . Wellness examination 12/04/2013  . Constipation due to slow transit 08/18/2013  . S/P left TKA 06/22/2013  . Meniere disease 04/21/2013  . Benign paroxysmal positional vertigo 04/21/2013  . History of methicillin resistant staphylococcus aureus (MRSA) 2015  . Psoriatic arthritis ([place]) 05/14/2012  . General medical exam 01/28/2012  . Cellulitis of eyelid 09/25/2011  . Diverticulitis 06/20/2011  . Erythrocytosis 05/18/2011  . S/P TKR (total knee replacement) 12/31/2010  . Obesity, Class III, BMI 40-49.9 (morbid obesity) ([place]) 12/31/2010  . Hyperlipidemia 12/22/2010  . BPH (benign prostatic hyperplasia) 12/22/2010  . Gout 12/22/2010  . OSA on CPAP 04/04/2009    Orientation RESPIRATION BLADDER Height & Weight     Self,Time,Situation,Place  Normal Continent Weight: 273 lb 12.8 oz (124.2 kg) Height:  5\' 11"  (180.3 cm)  BEHAVIORAL SYMPTOMS/MOOD NEUROLOGICAL BOWEL NUTRITION STATUS      Continent Diet (please see discahrge summary)  AMBULATORY STATUS COMMUNICATION OF NEEDS Skin   Limited Assist Verbally Surgical wounds (closed incision abdomen, incision abdomen 4 ports abdomen umbilicus, upper Mid right upper, right upper lateral)                       Personal Care Assistance Level of Assistance  Bathing,Feeding,Dressing Bathing Assistance: Limited assistance Feeding assistance: Independent Dressing Assistance: Limited assistance     Functional Limitations Info  Sight,Hearing,Speech Sight Info: Adequate Hearing Info: Adequate Speech Info: Adequate    SPECIAL CARE FACTORS FREQUENCY  PT (By licensed PT),OT (By licensed OT)     PT Frequency: 5x per week OT Frequency: 5x per week  Contractures Contractures Info: Not present    Additional Factors Info  Code Status,Allergies Code Status Info: FULL Allergies Info: Lyrica, Penicillins,Levofloxacin            Current Medications (02/04/2020):  This is the current hospital active medication list Current Facility-Administered Medications  Medication Dose Route Frequency Provider Last Rate Last Admin  . 0.9 %  sodium chloride infusion   Intravenous Continuous Georganna Skeans, MD 100 mL/hr at 02/04/20 1013 New Bag at 02/04/20 1013  . 0.9 %  sodium chloride infusion   Intravenous PRN Domenic Polite, MD 10 mL/hr at 02/02/20 2328 250 mL at 02/02/20 2328  . acetaminophen (TYLENOL) tablet 650 mg  650 mg Oral Q6H PRN Georganna Skeans, MD   650 mg at 02/03/20 2225   Or  . acetaminophen (TYLENOL) suppository 650 mg  650 mg Rectal Q6H PRN Georganna Skeans, MD      . apixaban Arne [place]) tablet 5 mg  5 mg Oral BID Wendee Beavers, RPH   5 mg at 02/04/20 1159  . atorvastatin (LIPITOR) tablet 40 mg  40 mg Oral Daily Darliss Cheney, MD   40 mg at 02/04/20 1008  . bisoprolol (ZEBETA) tablet 5 mg  5 mg Oral Daily Darliss Cheney, MD   5 mg at 02/04/20 1008  . ceFEPIme (MAXIPIME) 2 g in sodium chloride 0.9 % 100 mL IVPB  2 g Intravenous Q8H Meuth, Brooke A, PA-C 200 mL/hr at 02/04/20 1450 2 g at 02/04/20 1450   And  . metroNIDAZOLE (FLAGYL) IVPB 500 mg  500 mg Intravenous Q8H Meuth, Brooke A, PA-C 100 mL/hr at 02/04/20 1446 500 mg at 02/04/20 1446  . Chlorhexidine Gluconate Cloth 2 % PADS 6 each  6 each Topical Q0600 Domenic Polite, MD   6 each at 02/04/20 [phone]  . diltiazem (CARDIZEM CD) 24 hr capsule 120 mg  120 mg Oral Daily Darliss Cheney, MD   120 mg at 02/04/20 1159  . feeding supplement (GLUCERNA SHAKE) (GLUCERNA SHAKE) liquid 237 mL  237 mL Oral TID BM Meuth, Brooke A, PA-C   237 mL at 02/04/20 1008  . insulin aspart (novoLOG) injection 0-15 Units  0-15 Units Subcutaneous TID WC Georganna Skeans, MD   2 Units at 02/04/20 1159  . insulin aspart (novoLOG) injection 0-5 Units  0-5 Units Subcutaneous QHS Georganna Skeans, MD      . magnesium oxide (MAG-OX) tablet 400 mg  400 mg Oral BID Meuth, Brooke A, PA-C   400 mg at  02/04/20 1008  . meclizine (ANTIVERT) tablet 12.5 mg  12.5 mg Oral TID PRN Darliss Cheney, MD      . morphine 2 MG/ML injection 2 mg  2 mg Intravenous Q4H PRN Meuth, Brooke A, PA-C      . mupirocin ointment (BACTROBAN) 2 % 1 application  1 application Nasal BID Domenic Polite, MD   1 application at 13/08/65 1013  . ondansetron (ZOFRAN) tablet 4 mg  4 mg Oral Q6H PRN Georganna Skeans, MD       Or  . ondansetron [facility]) injection 4 mg  4 mg Intravenous Q6H PRN Georganna Skeans, MD   4 mg at 02/03/20 2225  . oxyCODONE (Oxy IR/ROXICODONE) immediate release tablet 5-10 mg  5-10 mg Oral Q4H PRN Meuth, Brooke A, PA-C   10 mg at 02/03/20 1110  . potassium chloride SA (KLOR-CON) CR tablet 40 mEq  40 mEq Oral BID Meuth, Brooke A, PA-C   40 mEq at 02/04/20 1008  . promethazine (PHENERGAN) injection  12.5 mg  12.5 mg Intravenous Q6H PRN Georganna Skeans, MD      . sodium chloride flush (NS) 0.9 % injection 3 mL  3 mL Intravenous Q12H Georganna Skeans, MD   3 mL at 02/04/20 1008  . tamsulosin (FLOMAX) capsule 0.4 mg  0.4 mg Oral Daily Darliss Cheney, MD   0.4 mg at 02/04/20 1008  . tiZANidine (ZANAFLEX) tablet 4 mg  4 mg Oral Daily PRN Darliss Cheney, MD         Discharge Medications: Please see discharge summary for a list of discharge medications.  Relevant Imaging Results:  Relevant Lab Results:   Additional Information SSN [id number]          [place] COVID-19 Vaccine 04/16/2019 , 03/23/2019  Vinie Sill, LCSWA

## 2020-02-04 NOTE — Evaluation (Signed)
Occupational Therapy Evaluation Patient Details Name: Gary Butler MRN: [id number] DOB: 03/30/1945 Today's Date: 02/04/2020    History of Present Illness Gary Butler is a 74/M chronically ill with history significant of OSA; psoriatic arthritis; morbid obesity (BMI 40); HTN; DM; BPH, chronic diastolic CHF (08/1694 echo with preserved EF and grade 2 diastolic dysfunction) presentedwith nausea and anorexia.    -prior admission from 9/27-10/5 with perirectal abscess s/p I&D. he also had new onset afib and was started on Eliquis.Ongoing lingering symptoms for 6 to 8 months, suspect acute on chronic cholecystitis.  Lap cholecystectomy 12/21.   Clinical Impression   Pt was functioning modified independently in ADL and assisted for IADL prior to admission. He presents with dizziness, generalized weakness, decreased activity tolerance and poor standing balance. Pt cooperative and motivated to get stronger. He currently requires supervision to total assist for ADL and +2 mod assist to stand and take a few steps toward [facility]. Recommending SNF for further rehab, pt is agreeable. Will follow acutely.    Follow Up Recommendations  SNF;Supervision/Assistance - 24 hour    Equipment Recommendations  None recommended by OT    Recommendations for Other Services       Precautions / Restrictions Precautions Precautions: Fall Precaution Comments: + dizziness      Mobility Bed Mobility Overal bed mobility: Needs Assistance Bed Mobility: Rolling;Sidelying to Sit;Sit to Sidelying Rolling: Mod assist;Min assist Sidelying to sit: +2 for physical assistance;Mod assist     Sit to sidelying: Mod assist General bed mobility comments: mod to roll L, min to roll R, assist for LEs over EOB and to raise trunk, assisted LEs back in bed at end of session    Transfers Overall transfer level: Needs assistance Equipment used: Rolling walker (2 wheeled) Transfers: Sit to/from Stand Sit to Stand: +2 physical  assistance;Mod assist;From elevated surface         General transfer comment: cues for hand placement, assist to rise and steady, pt wiht poor activity tolerance with side stepping towards HOB, sat abruptly    Balance Overall balance assessment: Needs assistance   Sitting balance-Leahy Scale: Fair       Standing balance-Leahy Scale: Poor Standing balance comment: relies on UE and external support                           ADL either performed or assessed with clinical judgement   ADL Overall ADL's : Needs assistance/impaired Eating/Feeding: Set up;Bed level   Grooming: Wash/dry face;Sitting;Supervision/safety   Upper Body Bathing: Moderate assistance;Sitting   Lower Body Bathing: Total assistance;+2 for physical assistance;Sit to/from stand   Upper Body Dressing : Minimal assistance;Sitting   Lower Body Dressing: +2 for physical assistance;Total assistance;Sit to/from stand       Toileting- Water quality scientist and Hygiene: Total assistance;+2 for physical assistance;Sit to/from stand       Functional mobility during ADLs: +2 for physical assistance;Minimal assistance (to take a few steps to [facility])       Vision Baseline Vision/History: Wears glasses Wears Glasses: Reading only Patient Visual Report: No change from baseline       Perception     Praxis      Pertinent Vitals/Pain Pain Assessment: Faces Faces Pain Scale: Hurts even more Pain Location: R LE with touch Pain Descriptors / Indicators: Moaning Pain Intervention(s): Monitored during session;Repositioned     Hand Dominance Right   Extremity/Trunk Assessment Upper Extremity Assessment Upper Extremity Assessment: Generalized weakness (edematous)  Lower Extremity Assessment Lower Extremity Assessment: Defer to PT evaluation   Cervical / Trunk Assessment Cervical / Trunk Assessment: Other exceptions (obesity)   Communication Communication Communication: No difficulties    Cognition Arousal/Alertness: Awake/alert Behavior During Therapy: WFL for tasks assessed/performed Overall Cognitive Status: Impaired/Different from baseline Area of Impairment: Following commands;Problem solving                       Following Commands: Follows one step commands with increased time     Problem Solving: Slow processing;Decreased initiation;Difficulty sequencing;Requires verbal cues General Comments: pt agreeable to further rehab in SNF, agrees he is too weak to return home   General Comments       Exercises     Shoulder Instructions      Home Living Family/patient expects to be discharged to:: Private residence Living Arrangements: Spouse/significant other Available Help at Discharge: Family;Available 24 hours/day Type of Home: House Home Access: Ramped entrance     Home Layout: One level               Home Equipment: Walker - 2 wheels;Bedside commode;Cane - single point;Shower seat          Prior Functioning/Environment Level of Independence: Independent with assistive device(s)                 OT Problem List: Decreased strength;Decreased activity tolerance;Impaired balance (sitting and/or standing);Decreased cognition;Decreased safety awareness;Decreased knowledge of use of DME or AE;Obesity;Pain;Cardiopulmonary status limiting activity      OT Treatment/Interventions: Self-care/ADL training;DME and/or AE instruction;Patient/family education;Balance training;Therapeutic activities;Cognitive remediation/compensation    OT Goals(Current goals can be found in the care plan section) Acute Rehab OT Goals Patient Stated Goal: to get stronger OT Goal Formulation: With patient Time For Goal Achievement: 02/18/20 Potential to Achieve Goals: Good ADL Goals Pt Will Perform Grooming: with set-up;sitting (3 activities) Pt Will Perform Upper Body Bathing: with min assist;sitting Pt Will Perform Upper Body Dressing: with  supervision;sitting Pt Will Transfer to Toilet: with min assist;ambulating;bedside commode Pt Will Perform Toileting - Clothing Manipulation and hygiene: with min assist;sit to/from stand Additional ADL Goal #1: Pt will perform bed mobility with min assist in preparation for ADL.  OT Frequency: Min 2X/week   Barriers to D/C:            Co-evaluation PT/OT/SLP Co-Evaluation/Treatment: Yes Reason for Co-Treatment: For patient/therapist safety   OT goals addressed during session: ADL's and self-care      AM-PAC OT "6 Clicks" Daily Activity     Outcome Measure Help from another person eating meals?: A Little Help from another person taking care of personal grooming?: A Little Help from another person toileting, which includes using toliet, bedpan, or urinal?: Total Help from another person bathing (including washing, rinsing, drying)?: A Lot Help from another person to put on and taking off regular upper body clothing?: A Little Help from another person to put on and taking off regular lower body clothing?: Total 6 Click Score: 13   End of Session Equipment Utilized During Treatment: Gait belt;Rolling walker Nurse Communication: Other (comment) (PT requested antivertigo meds)  Activity Tolerance: Patient limited by fatigue Patient left: in bed;with call bell/phone within reach;with bed alarm set  OT Visit Diagnosis: Unsteadiness on feet (R26.81);Other abnormalities of gait and mobility (R26.89);Muscle weakness (generalized) (M62.81);Pain;Other symptoms and signs involving cognitive function                Time: [id number] OT Time Calculation (min): 22 min Charges:  OT General Charges $OT Visit: 1 Visit OT Evaluation $OT Eval Moderate Complexity: [place], OTR/L Acute Rehabilitation Services Pager: [phone] Office: [phone]  Malka So 02/04/2020, 11:37 AM

## 2020-02-04 NOTE — Progress Notes (Addendum)
PROGRESS NOTE    Gary Butler  [id number] DOB: [birth year] DOA: 01/30/2020 PCP: [name], MD  Brief Narrative:HPI: Gary Butler is a 74/M chronically ill with history significant of OSA; psoriatic arthritis; morbid obesity (BMI 40); HTN; DM; BPH, chronic diastolic CHF (0000000 echo with preserved EF and grade 2 diastolic dysfunction) presentedwith nausea and anorexia.   -prior admission from 9/27-10/5 with perirectal abscess s/p I&D. he also had new onset afib and was started on Eliquis. --Last admitted from 10/9-13 with hematochezia; this was felt to be related to recent perineal surgery site bleeding and Eliquis was resumed at hospital d/c.  He was discharged to [facility] for rehab and was discharged to home in late November.   He has recently been referred to palliative care as an outpatient.  - He has had 3 recent ER visits to [facility] hospital most recently on 12/10.  He reported 8 months of anorexia, nausea and post-meal emesis.   -Progressively worsened, has been on ice chips for several weeks, lost about 100 pounds of weight  ED Course:  In the ED noted to have A. fib RVR, severe hypokalemia -CT abdomen pelvis concerning for emphysematous/gangrenous cholecystitis -Seen by general surgery and cardiology for clearance  Assessment & Plan:   Sepsis Gangrenous/emphysematous cholecystitis -General surgery following, and seen by cardiology for clearance -Ongoing lingering symptoms for 6 to 8 months, suspect acute on chronic cholecystitis HIDA scan abnormal. Status post laparoscopic cholecystectomy on 02/02/2020 by Dr. Grandville Silos. Continue IV antibiotics for another day per general surgery.  Tolerating regular diet.  Management per general surgery.  Recent perirectal abscess  -With perirectal thickening on CT -Antibiotics as above, I wonder if this is unexpected in the setting of recent abscess and surgery  A. fib with RVR Chronic diastolic CHF -Rates much better  controlled.  Off of heparin since this morning.  Continue bisoprolol and add Cardizem CD 120 mg p.o. daily.  Has been cleared by general surgery so we will transition from IV heparin to oral Eliquis.  Type 2 diabetes mellitus -Jardiance on hold, continue sliding scale insulin.  Blood sugar controlled.  Mild dementia/cognitive deficits: Noted however patient seems to be completely alert and oriented.  Morbid obesity Obstructive sleep apnea -Has lost 100 pounds and needs continued weight loss -Has not been using BiPAP/CPAP following weight loss  Goals of care DO NOT RESUSCITATE -Followed by palliative care, not under hospice  Deconditioning: Seen by PT OT and they recommend SNF.  Patient initially reluctant for that but after discussion today, he is agreeable to do that.  Informed TOC to discuss this with the patient and start working on that.  Vertigo: Per PT, patient is having vertigo every time he is working with PT.  Not sure if this is BPPV.  They are unable to assess him further.  We will start him on meclizine per the recommendation.  Fluid overload: Patient has +2 pitting edema bilateral lower extremity.  It appears that he was taking Lasix 20 mg p.o. daily which has been on hold.  Does not seem to have acute pulmonary edema.  We will give him a dose of IV Lasix 40 mg and reassess tomorrow.  Possible peripheral neuropathy: Patient is very tender to touch on both lower extremities.  Likely undiagnosed peripheral neuropathy in a patient who already has diabetes.  Will need to be started on gabapentin at the time of discharge.  Hypomagnesemia/hypokalemia: Replace both of them.  Recheck in the morning.  DVT prophylaxis: Heparin subcu, will transition to Eliquis Code Status: DNR Family Communication: No family at bedside  Disposition Plan:  Status is: Inpatient  Remains inpatient appropriate because:Inpatient level of care appropriate due to severity of illness   Dispo: The  patient is from: Home              Anticipated d/c is to: May need rehab              Anticipated d/c date is: 1 to 2 days              Patient currently is not medically stable to d/c.  Consultants:   General surgery  Cardiology   Procedures:   Antimicrobials:    Subjective: Patient seen and examined.  Completely alert and oriented.  Feels weak.  No other complaint.  Objective: Vitals:   02/04/20 0200 02/04/20 0400 02/04/20 0500 02/04/20 0915  BP: 103/63 105/69  104/75  Pulse: 98 95    Resp: 17 14  17   Temp:  (!) 97.4 F (36.3 C)  (!) 97.5 F (36.4 C)  TempSrc:  Oral  Oral  SpO2: 92% 95%  96%  Weight:   124.2 kg   Height:        Intake/Output Summary (Last 24 hours) at 02/04/2020 1048 Last data filed at 02/04/2020 0636 Gross per 24 hour  Intake 5564.81 ml  Output 720 ml  Net 4844.81 ml   Filed Weights   01/30/20 2107 02/02/20 0500 02/04/20 0500  Weight: 118.4 kg 119.6 kg 124.2 kg    Examination:  General exam:   General exam: Appears calm and comfortable, morbidly obese Respiratory system: Diminished breath sounds due to body habitus. Respiratory effort normal. Cardiovascular system: S1 & S2 heard, RRR. No JVD, murmurs, rubs, gallops or clicks.  +2 pitting edema bilateral lower extremity Gastrointestinal system: Abdomen is nondistended, soft and nontender. No organomegaly or masses felt. Normal bowel sounds heard. Central nervous system: Alert and oriented. No focal neurological deficits. Extremities: Symmetric 5 x 5 power. Skin: No rashes, lesions or ulcers.  Psychiatry: Judgement and insight appear normal. Mood & affect appropriate.   Data Reviewed:   CBC: Recent Labs  Lab 01/31/20 0346 02/01/20 0635 02/02/20 0123 02/03/20 0415 02/04/20 0130  WBC 4.9 4.5 4.3 6.0 5.3  HGB 14.3 13.6 13.9 13.6 12.4*  HCT 42.3 40.4 40.9 40.4 35.2*  MCV 90.0 90.6 90.1 89.8 88.2  PLT 116* 111* 96* 94* 81*   Basic Metabolic Panel: Recent Labs  Lab  01/30/20 1246 01/31/20 0346 02/01/20 0635 02/02/20 0123 02/03/20 0415 02/04/20 0130  NA 137 138 137 138 137 138  K 2.7* 2.8* 3.2* 3.5 3.7 3.1*  CL 97* 101 105 106 106 105  CO2 21* 19* 19* 18* 19* 19*  GLUCOSE 186* 167* 131* 123* 177* 139*  BUN 13 13 11 13 10 8   CREATININE 1.19 0.97 0.90 0.88 0.89 0.72  CALCIUM 9.7 8.9 8.5* 8.5* 8.5* 8.3*  MG 1.5*  --   --   --   --  1.6*   GFR: Estimated Creatinine Clearance: 107.1 mL/min (by C-G formula based on SCr of 0.72 mg/dL). Liver Function Tests: Recent Labs  Lab 01/31/20 0346 02/01/20 0635 02/02/20 0123 02/03/20 0415 02/04/20 0130  AST 10* 11* 9* 30 21  ALT 10 9 8 16 15   ALKPHOS 61 58 60 56 49  BILITOT 1.8* 1.6* 1.5* 1.5* 1.0  PROT 4.3* 4.3* 4.1* 4.3* 3.8*  ALBUMIN 2.2* 2.1* 2.1* 2.2* 1.9*  Recent Labs  Lab 01/30/20 1246  LIPASE 48   No results for input(s): AMMONIA in the last 168 hours. Coagulation Profile: Recent Labs  Lab 01/30/20 1246  INR 1.6*   Cardiac Enzymes: No results for input(s): CKTOTAL, CKMB, CKMBINDEX, TROPONINI in the last 168 hours. BNP (last 3 results) No results for input(s): PROBNP in the last 8760 hours. HbA1C: No results for input(s): HGBA1C in the last 72 hours. CBG: Recent Labs  Lab 02/03/20 0659 02/03/20 1119 02/03/20 1738 02/03/20 2141 02/04/20 0640  GLUCAP 173* 135* 149* 124* 143*   Lipid Profile: No results for input(s): CHOL, HDL, LDLCALC, TRIG, CHOLHDL, LDLDIRECT in the last 72 hours. Thyroid Function Tests: No results for input(s): TSH, T4TOTAL, FREET4, T3FREE, THYROIDAB in the last 72 hours. Anemia Panel: No results for input(s): VITAMINB12, FOLATE, FERRITIN, TIBC, IRON, RETICCTPCT in the last 72 hours. Urine analysis:    Component Value Date/Time   COLORURINE YELLOW 11/09/2019 1937   APPEARANCEUR CLEAR 11/09/2019 1937   LABSPEC <1.005 (L) 11/09/2019 1937   PHURINE 5.5 11/09/2019 1937   GLUCOSEU >=500 (A) 11/09/2019 1937   HGBUR NEGATIVE 11/09/2019 1937    BILIRUBINUR NEGATIVE 11/09/2019 1937   KETONESUR NEGATIVE 11/09/2019 1937   PROTEINUR NEGATIVE 11/09/2019 1937   UROBILINOGEN 1.0 06/10/2013 1000   NITRITE NEGATIVE 11/09/2019 1937   LEUKOCYTESUR NEGATIVE 11/09/2019 1937   Sepsis Labs: @LABRCNTIP (procalcitonin:4,lacticidven:4)  ) Recent Results (from the past 240 hour(s))  Resp Panel by RT-PCR (Flu A&B, Covid) Nasopharyngeal Swab     Status: None   Collection Time: 01/30/20 12:38 PM   Specimen: Nasopharyngeal Swab; Nasopharyngeal(NP) swabs in vial transport medium  Result Value Ref Range Status   SARS Coronavirus 2 by RT PCR NEGATIVE NEGATIVE Final    Comment: (NOTE) SARS-CoV-2 target nucleic acids are NOT DETECTED.  The SARS-CoV-2 RNA is generally detectable in upper respiratory specimens during the acute phase of infection. The lowest concentration of SARS-CoV-2 viral copies this assay can detect is 138 copies/mL. A negative result does not preclude SARS-Cov-2 infection and should not be used as the sole basis for treatment or other patient management decisions. A negative result may occur with  improper specimen collection/handling, submission of specimen other than nasopharyngeal swab, presence of viral mutation(s) within the areas targeted by this assay, and inadequate number of viral copies(<138 copies/mL). A negative result must be combined with clinical observations, patient history, and epidemiological information. The expected result is Negative.  Fact Sheet for Patients:  EntrepreneurPulse.com.au  Fact Sheet for Healthcare Providers:  IncredibleEmployment.be  This test is no t yet approved or cleared by the [place] FDA and  has been authorized for detection and/or diagnosis of SARS-CoV-2 by FDA under an Emergency Use Authorization (EUA). This EUA will remain  in effect (meaning this test can be used) for the duration of the COVID-19 declaration under Section 564(b)(1) of  the Act, 21 U.S.C.section 360bbb-3(b)(1), unless the authorization is terminated  or revoked sooner.       Influenza A by PCR NEGATIVE NEGATIVE Final   Influenza B by PCR NEGATIVE NEGATIVE Final    Comment: (NOTE) The Xpert Xpress SARS-CoV-2/FLU/RSV plus assay is intended as an aid in the diagnosis of influenza from Nasopharyngeal swab specimens and should not be used as a sole basis for treatment. Nasal washings and aspirates are unacceptable for Xpert Xpress SARS-CoV-2/FLU/RSV testing.  Fact Sheet for Patients: EntrepreneurPulse.com.au  Fact Sheet for Healthcare Providers: IncredibleEmployment.be  This test is not yet approved or cleared by the [place]  States FDA and has been authorized for detection and/or diagnosis of SARS-CoV-2 by FDA under an Emergency Use Authorization (EUA). This EUA will remain in effect (meaning this test can be used) for the duration of the COVID-19 declaration under Section 564(b)(1) of the Act, 21 U.S.C. section 360bbb-3(b)(1), unless the authorization is terminated or revoked.  Performed at [facility], [place] [address] [place], [place] [address]   Culture, blood (routine x 2)     Status: None   Collection Time: 01/30/20  4:40 PM   Specimen: BLOOD RIGHT FOREARM  Result Value Ref Range Status   Specimen Description BLOOD RIGHT FOREARM  Final   Special Requests   Final    BOTTLES DRAWN AEROBIC AND ANAEROBIC Blood Culture results may not be optimal due to an inadequate volume of blood received in culture bottles   Culture   Final    NO GROWTH 5 DAYS Performed at [facility], [place] [address], [place], [place] [address]    Report Status 02/04/2020 FINAL  Final  Culture, blood (routine x 2)     Status: None   Collection Time: 01/30/20  9:54 PM   Specimen: BLOOD  Result Value Ref Range Status   Specimen Description BLOOD LEFT HAND  Final   Special Requests   Final    BOTTLES DRAWN AEROBIC ONLY  Blood Culture adequate volume   Culture   Final    NO GROWTH 5 DAYS Performed at [facility], [place] [address], [place], [place] [address]    Report Status 02/04/2020 FINAL  Final  MRSA PCR Screening     Status: None   Collection Time: 01/31/20  7:34 AM   Specimen: Nasal Mucosa; Nasopharyngeal  Result Value Ref Range Status   MRSA by PCR NEGATIVE NEGATIVE Final    Comment:        The GeneXpert MRSA Assay (FDA approved for NASAL specimens only), is one component of a comprehensive MRSA colonization surveillance program. It is not intended to diagnose MRSA infection nor to guide or monitor treatment for MRSA infections. Performed at [facility], [place] [address], [place], [place] [address]   Surgical pcr screen     Status: Abnormal   Collection Time: 02/02/20  6:06 AM   Specimen: Nasal Mucosa; Nasal Swab  Result Value Ref Range Status   MRSA, PCR NEGATIVE NEGATIVE Final   Staphylococcus aureus POSITIVE (A) NEGATIVE Final    Comment: (NOTE) The Xpert SA Assay (FDA approved for NASAL specimens in patients 57 years of age and older), is one component of a comprehensive surveillance program. It is not intended to diagnose infection nor to guide or monitor treatment. Performed at [facility], [place] [address], [place], [place] [address]     Radiology Studies: No results found. Scheduled Meds: . atorvastatin  40 mg Oral Daily  . bisoprolol  5 mg Oral Daily  . Chlorhexidine Gluconate Cloth  6 each Topical Q0600  . feeding supplement (GLUCERNA SHAKE)  237 mL Oral TID BM  . insulin aspart  0-15 Units Subcutaneous TID WC  . insulin aspart  0-5 Units Subcutaneous QHS  . magnesium oxide  400 mg Oral BID  . mupirocin ointment  1 application Nasal BID  . potassium chloride  40 mEq Oral BID  . sodium chloride flush  3 mL Intravenous Q12H  . tamsulosin  0.4 mg Oral Daily   Continuous Infusions: . sodium chloride 100 mL/hr at 02/04/20 1013  .  sodium chloride 250 mL  (02/02/20 2328)  . ceFEPime (MAXIPIME) IV 2 g (02/04/20 0505)   And  . metronidazole 500 mg (02/04/20 0636)  . diltiazem (CARDIZEM) infusion Stopped (02/03/20 2210)  . heparin 1,250 Units/hr (02/04/20 0734)  . magnesium sulfate bolus IVPB       LOS: 5 days   Time spent: 30 min  Darliss Cheney, MD [facility]  02/04/2020, 10:48 AM

## 2020-02-04 NOTE — Discharge Instructions (Signed)
[facility] LAPAROSCOPIC SURGERY: POST OP INSTRUCTIONS Always review your discharge instruction sheet given to you by the facility where your surgery was performed. IF YOU HAVE DISABILITY OR FAMILY LEAVE FORMS, YOU MUST BRING THEM TO THE OFFICE FOR PROCESSING.   DO NOT GIVE THEM TO YOUR DOCTOR.  PAIN CONTROL  1. First take acetaminophen (Tylenol) AND/or ibuprofen (Advil) to control your pain after surgery.  Follow directions on package.  Taking acetaminophen (Tylenol) and/or ibuprofen (Advil) regularly after surgery will help to control your pain and lower the amount of prescription pain medication you may need.  You should not take more than 3,000 mg (3 grams) of acetaminophen (Tylenol) in 24 hours.  You should not take ibuprofen (Advil), aleve, motrin, naprosyn or other NSAIDS if you have a history of stomach ulcers or chronic kidney disease.  2. A prescription for pain medication may be given to you upon discharge.  Take your pain medication as prescribed, if you still have uncontrolled pain after taking acetaminophen (Tylenol) or ibuprofen (Advil). 3. Use ice packs to help control pain. 4. If you need a refill on your pain medication, please contact your pharmacy.  They will contact our office to request authorization. Prescriptions will not be filled after 5pm or on week-ends.  HOME MEDICATIONS 5. Take your usually prescribed medications unless otherwise directed.  DIET 6. You should follow a light diet the first few days after arrival home.  Be sure to include lots of fluids daily. Avoid fatty, fried foods.   CONSTIPATION 7. It is common to experience some constipation after surgery and if you are taking pain medication.  Increasing fluid intake and taking a stool softener (such as Colace) will usually help or prevent this problem from occurring.  A mild laxative (Milk of Magnesia or Miralax) should be taken according to package instructions if there are no bowel  movements after 48 hours.  WOUND/INCISION CARE 8. Most patients will experience some swelling and bruising in the area of the incisions.  Ice packs will help.  Swelling and bruising can take several days to resolve.  9. Unless discharge instructions indicate otherwise, follow guidelines below  a. STERI-STRIPS - you may remove your outer bandages 48 hours after surgery, and you may shower at that time.  You have steri-strips (small skin tapes) in place directly over the incision.  These strips should be left on the skin for 7-10 days.   b. DERMABOND/SKIN GLUE - you may shower in 24 hours.  The glue will flake off over the next 2-3 weeks. 10. Any sutures or staples will be removed at the office during your follow-up visit.  ACTIVITIES 11. You may resume regular (light) daily activities beginning the next day--such as daily self-care, walking, climbing stairs--gradually increasing activities as tolerated.  You may have sexual intercourse when it is comfortable.  Refrain from any heavy lifting or straining until approved by your doctor. a. You may drive when you are no longer taking prescription pain medication, you can comfortably wear a seatbelt, and you can safely maneuver your car and apply brakes.  FOLLOW-UP 12. You should see your doctor in the office for a follow-up appointment approximately 2-3 weeks after your surgery.  You should have been given your post-op/follow-up appointment when your surgery was scheduled.  If you did not receive a post-op/follow-up appointment, make sure that you call for this appointment within a day or two after you arrive home to insure a convenient appointment time.     WHEN TO CALL YOUR DOCTOR: 1. Fever over 101.0 2. Inability to urinate 3. Continued bleeding from incision. 4. Increased pain, redness, or drainage from the incision. 5. Increasing abdominal pain  The clinic staff is available to answer your questions during regular business hours.  Please don't  hesitate to call and ask to speak to one of the nurses for clinical concerns.  If you have a medical emergency, go to the nearest emergency room or call 911.  A surgeon from [facility] is always on call at the hospital. [address] [place], [place], [place]  [address] ? [address] [place], [place], [place]   [address] [phone] ? [phone] ? FAX [phone] Web site: [email or web address]   Information on my medicine - ELIQUIS (apixaban)  This medication education was reviewed with me or my healthcare representative as part of my discharge preparation.  Why was Eliquis prescribed for you? Eliquis was prescribed for you to reduce the risk of a blood clot forming that can cause a stroke if you have a medical condition called atrial fibrillation (a type of irregular heartbeat).  What do You need to know about Eliquis ? Take your Eliquis TWICE DAILY - one tablet in the morning and one tablet in the evening with or without food. If you have difficulty swallowing the tablet whole please discuss with your pharmacist how to take the medication safely.  Take Eliquis exactly as prescribed by your doctor and DO NOT stop taking Eliquis without talking to the doctor who prescribed the medication.  Stopping may increase your risk of developing a stroke.  Refill your prescription before you run out.  After discharge, you should have regular check-up appointments with your healthcare provider that is prescribing your Eliquis.  In the future your dose may need to be changed if your kidney function or weight changes by a significant amount or as you get older.  What do you do if you miss a dose? If you miss a dose, take it as soon as you remember on the same day and resume taking twice daily.  Do not take more than one dose of ELIQUIS at the same time to make up a missed dose.  Important Safety Information A possible side effect of Eliquis is bleeding. You should  call your healthcare provider right away if you experience any of the following: ? Bleeding from an injury or your nose that does not stop. ? Unusual colored urine (red or dark brown) or unusual colored stools (red or black). ? Unusual bruising for unknown reasons. ? A serious fall or if you hit your head (even if there is no bleeding).  Some medicines may interact with Eliquis and might increase your risk of bleeding or clotting while on Eliquis. To help avoid this, consult your healthcare provider or pharmacist prior to using any new prescription or non-prescription medications, including herbals, vitamins, non-steroidal anti-inflammatory drugs (NSAIDs) and supplements.  This website has more information on Eliquis (apixaban): http://www.eliquis.com/eliquis/home

## 2020-02-04 NOTE — Progress Notes (Addendum)
Stopped Cardizem due to low BP 82/25mmHg. HR 80s, Atrial fib. We will continue to monitor.    02/03/20 2210  Vitals  Temp 97.8 F (36.6 C)  Temp Source Oral  BP (!) 82/61  MAP (mmHg) 69  BP Location Left Arm  BP Method Automatic  Patient Position (if appropriate) Lying  Pulse Rate 88  Pulse Rate Source Monitor  ECG Heart Rate 89  Resp 11  Level of Consciousness  Level of Consciousness Alert  MEWS COLOR  MEWS Score Color Yellow  Oxygen Therapy  SpO2 93 %  O2 Device Room Air  Pain Assessment  Pain Scale 0-10  Pain Score 5  Pain Type Surgical pain  Pain Location Abdomen  Pain Orientation Medial  Pain Intervention(s) Medication (See eMAR);Relaxation;Rest  Glasgow Coma Scale  Eye Opening 4  Best Verbal Response (NON-intubated) 5  Best Motor Response 6  Glasgow Coma Scale Score 15    Kennyth Lose, RN

## 2020-02-04 NOTE — CV Procedure (Signed)
LUE venous duplex completed.  Results can be found under chart review under CV PROC. 02/04/2020 2:30 PM Tejasvi Brissett RVT, RDMS

## 2020-02-04 NOTE — Progress Notes (Addendum)
ADDENDUM:  Transition back to [facility] for Heparin;    Indication: nonvalvular atrial fibrillation  Allergies  Allergen Reactions  . Penicillins Hives    Patient reports full body hives that required medical treatment when he was in his 27s or 28s. He tolerated cephalosporins.   Gary Butler [Pregabalin] Swelling  . Levofloxacin Swelling    Patient Measurements: Height: 5\' 11"  (180.3 cm) Weight: 124.2 kg (273 lb 12.8 oz) IBW/kg (Calculated) : 75.3 Heparin Dosing Weight: 101.8 kg  Vital Signs: Temp: 97.5 F (36.4 C) (12/23 0915) Temp Source: Oral (12/23 0915) BP: 104/75 (12/23 0915) Pulse Rate: 95 (12/23 0400)  Labs: Recent Labs    02/02/20 0123 02/03/20 0415 02/03/20 2152 02/04/20 0130  HGB 13.9 13.6  --  12.4*  HCT 40.9 40.4  --  35.2*  PLT 96* 94*  --  81*  APTT  --   --  >200*  --   HEPARINUNFRC  --   --  0.50 0.58  CREATININE 0.88 0.89  --  0.72    Estimated Creatinine Clearance: 107.1 mL/min (by C-G formula based on SCr of 0.72 mg/dL).   Assessment: Patient is a 34 yom that is being admitted for Nausea and emphysematous cholecystitis. The patient also presented with Afivb with RVR. Patient was previously on Apixaban prior to admission and rate control medications however stopped the Apixaban secondary to  bleeding.  Was started on IV heparin infusion on 12/18 which was discontinued on the next day 12/19 due to severe gangrenous cholecystitis and risk of bleeding . Transitioned to SQ Heparin for VTE prophylaxis. >> then s/p lap cholecystectomy, cholangiogram on 12/21, On 12/22  Dr. Doristine Bosworth discussed with general surgery who has cleared patient for heparin. Pharmacy was consulted on 02/03/20 to restart IV heparin for Atrial fibrillation.  Heparin level is therapeutic at 0.58 this morning on heparin drip 1250 units/hr.  H/H down slightly to 12.4/35.2.  Thrombocytopenia:  PLTC 139 on admssion>>111>96>94>81k.  No  bleeding noted   Goal of Therapy:  Heparin level 0.3-0.7 units/ml Monitor platelets by anticoagulation protocol: Yes   Plan:  Continue Heparin drip @ 1250 units/hr Daily Heparin level and CBC  Thank you for allowing pharmacy to be part of this patients care team.  Nicole Cella, [place] Pharmacist [phone] Please see amion for complete clinical pharmacist phone list 02/04/2020,10:13 AM   ADDENDUM:  Transition back to Apixaban  The patient was taking Elquis (apixaban) for nonvalvual atrial fibrillation previously although it was held during hospital admission 10/9-10/13/21 due to hematochezia,  this was felt to be related to recent perineal surgery site bleeding and Eliquis was resumed at hospital d/c. He was discharged to [facility] for rehab and was discharged to home in late November 2021.  A review of prior to admission medication history shows Apixaban was held again sometime between 11/15 and 01/13/20.  He was not currently taking apixaban prior to this admission.    Today, Dr. Doristine Bosworth notes that surgery has cleared him for anticoagulation.  Pharmacy consulted to transition from IV heparin to oral Eliquis today for nonvavular atrial fibrillation.  Age<80, weight is >60kg, SCr is <1.5, thus I will restart Eliquis 5 mg BID and stop IV heparin now.   PLAN:  Discontinue IV heparin now and restart Eliquis 5 mg BID.  Monitor for signs and symptoms of bleeding.  Nicole Cella, RPh Clinical Pharmacist [phone] Please check AMION for all [place] phone numbers After 10:00 PM,  call [place] [phone] 02/04/2020 12:00 PM

## 2020-02-04 NOTE — Progress Notes (Signed)
[facility] Progress Note  2 Days Post-Op  Subjective: CC-  Abdomen still sore but pain well controlled. Denies n/v. BM yesterday. Mostly just eating ice chips but states that he does feel a little hungry today and ordered lunch. Therapies recommending SNF.  Objective: Vital signs in last 24 hours: Temp:  [97.4 F (36.3 C)-97.8 F (36.6 C)] 97.5 F (36.4 C) (12/23 0915) Pulse Rate:  [80-99] 95 (12/23 0400) Resp:  [11-24] 17 (12/23 0915) BP: (82-114)/(58-80) 104/75 (12/23 0915) SpO2:  [92 %-99 %] 96 % (12/23 0915) Weight:  [124.2 kg] 124.2 kg (12/23 0500) Last BM Date: 02/01/20  Intake/Output from previous day: 12/22 0701 - 12/23 0700 In: 5564.8 [P.O.:250; I.V.:4394.2; IV Piggyback:920.6] Out: 770 [Urine:770] Intake/Output this shift: No intake/output data recorded.  PE: Gen:  Alert, NAD, pleasant Pulm: rate and effort normal Abd: obese, soft, not grossly distended, appropriately tender RUQ and over incisions, lap incisions C/D/I   Lab Results:  Recent Labs    02/03/20 0415 02/04/20 0130  WBC 6.0 5.3  HGB 13.6 12.4*  HCT 40.4 35.2*  PLT 94* 81*   BMET Recent Labs    02/03/20 0415 02/04/20 0130  NA 137 138  K 3.7 3.1*  CL 106 105  CO2 19* 19*  GLUCOSE 177* 139*  BUN 10 8  CREATININE 0.89 0.72  CALCIUM 8.5* 8.3*   PT/INR No results for input(s): LABPROT, INR in the last 72 hours. CMP     Component Value Date/Time   NA 138 02/04/2020 0130   NA 140 12/02/2019 0000   NA 139 06/17/2013 0946   K 3.1 (L) 02/04/2020 0130   K 3.5 06/17/2013 0946   CL 105 02/04/2020 0130   CL 102 05/14/2012 0856   CO2 19 (L) 02/04/2020 0130   CO2 27 06/17/2013 0946   GLUCOSE 139 (H) 02/04/2020 0130   GLUCOSE 184 (H) 06/17/2013 0946   GLUCOSE 135 (H) 05/14/2012 0856   BUN 8 02/04/2020 0130   BUN 5 12/02/2019 0000   BUN 13.8 06/17/2013 0946   CREATININE 0.72 02/04/2020 0130   CREATININE 0.95 07/14/2015 1019   CREATININE 1.1 06/17/2013 0946   CALCIUM 8.3  (L) 02/04/2020 0130   CALCIUM 9.3 06/17/2013 0946   PROT 3.8 (L) 02/04/2020 0130   PROT 5.7 (L) 06/17/2013 0946   ALBUMIN 1.9 (L) 02/04/2020 0130   ALBUMIN 3.2 (L) 06/17/2013 0946   AST 21 02/04/2020 0130   AST 19 06/17/2013 0946   ALT 15 02/04/2020 0130   ALT 18 06/17/2013 0946   ALKPHOS 49 02/04/2020 0130   ALKPHOS 101 06/17/2013 0946   BILITOT 1.0 02/04/2020 0130   BILITOT 1.54 (H) 06/17/2013 0946   GFRNONAA >60 02/04/2020 0130   GFRAA >90 12/02/2019 0000   Lipase     Component Value Date/Time   LIPASE 48 01/30/2020 1246       Studies/Results: No results found.  Anti-infectives: Anti-infectives (From admission, onward)   Start     Dose/Rate Route Frequency Ordered Stop   01/31/20 1400  ceFEPIme (MAXIPIME) 2 g in sodium chloride 0.9 % 100 mL IVPB       "And" Linked Group Details   2 g 200 mL/hr over 30 Minutes Intravenous Every 8 hours 01/31/20 1158     01/31/20 1245  metroNIDAZOLE (FLAGYL) IVPB 500 mg       "And" Linked Group Details   500 mg 100 mL/hr over 60 Minutes Intravenous Every 8 hours 01/31/20 1158     01/30/20  1445  ceFEPIme (MAXIPIME) 2 g in sodium chloride 0.9 % 100 mL IVPB       "And" Linked Group Details   2 g 200 mL/hr over 30 Minutes Intravenous  Once 01/30/20 1438 01/30/20 1856   01/30/20 1445  metroNIDAZOLE (FLAGYL) IVPB 500 mg       "And" Linked Group Details   500 mg 100 mL/hr over 60 Minutes Intravenous  Once 01/30/20 1438 01/30/20 1856       Assessment/Plan A Fib with RVR - on cardizem drip Chronic diastolic CHF DM Morbid obesity BMI 36.77 OSA Code status DNR  Cholecystitis S/p laparoscopic cholecystectomy 12/21 Dr. Grandville Silos - POD#2 - WBC 5.3, afebrile - LFTs WNL - Advance to HH/CM diet. Continue mobilizing, PT/OT. He is open to going to SNF when discharged.  Stop antibiotics after today and patient is ok for discharge from surgical standpoint. Discharge instructions and follow up info on AVS. We will be available if needed,  please call with questions or concerns.   ID - maxipime/flagyl 12/18>>12/23 FEN - IVF, FLD ADAT VTE - SCDs, IV heparin Foley - none Follow up - [facility] clinic   LOS: 5 days    Wellington Hampshire, [facility] 02/04/2020, 9:56 AM Please see Amion for pager number during day hours 7:00am-4:30pm

## 2020-02-04 NOTE — Progress Notes (Signed)
Results for Gary Butler, Gary Butler (MRN [id number]) as of 02/04/2020 02:02  Ref. Range 02/03/2020 21:52  Heparin Unfractionated Latest Ref Range: 0.30 - 0.70 IU/mL 0.50  APTT Latest Ref Range: 24 - 36 seconds >200 (HH)     Lab reported critical value: APTT > 200  Name of provider notified: Edison Simon, pharmacist   No actions taken, Pt has no signs of bleeding noted. Pharmacist will continue to monitor heparin level at am., and disregard APTT level at this time.  Kennyth Lose, RN

## 2020-02-05 LAB — GLUCOSE, CAPILLARY
Glucose-Capillary: 139 mg/dL — ABNORMAL HIGH (ref 70–99)
Glucose-Capillary: 140 mg/dL — ABNORMAL HIGH (ref 70–99)
Glucose-Capillary: 133 mg/dL — ABNORMAL HIGH (ref 70–99)
Glucose-Capillary: 123 mg/dL — ABNORMAL HIGH (ref 70–99)

## 2020-02-05 LAB — BASIC METABOLIC PANEL
Anion gap: 14 (ref 5–15)
BUN: 11 mg/dL (ref 8–23)
CO2: 14 mmol/L — ABNORMAL LOW (ref 22–32)
Calcium: 8 mg/dL — ABNORMAL LOW (ref 8.9–10.3)
Chloride: 108 mmol/L (ref 98–111)
Creatinine, Ser: 0.77 mg/dL (ref 0.61–1.24)
GFR, Estimated: >60 mL/min (ref >60)
Glucose, Bld: 128 mg/dL — ABNORMAL HIGH (ref 70–99)
Potassium: 4.9 mmol/L (ref 3.5–5.1)
Sodium: 136 mmol/L (ref 135–145)

## 2020-02-05 LAB — CBC
HCT: 38.6 % — ABNORMAL LOW (ref 39.0–52.0)
Hemoglobin: 14.3 g/dL (ref 13.0–17.0)
MCH: 31.5 pg (ref 26.0–34.0)
MCHC: 37 g/dL — ABNORMAL HIGH (ref 30.0–36.0)
MCV: 85 fL (ref 80.0–100.0)
Platelets: 58 10*3/uL — ABNORMAL LOW (ref 150–400)
RBC: 4.54 MIL/uL (ref 4.22–5.81)
RDW: 15.9 % — ABNORMAL HIGH (ref 11.5–15.5)
WBC: 3.8 10*3/uL — ABNORMAL LOW (ref 4.0–10.5)
nRBC: 0 % (ref 0.0–0.2)

## 2020-02-05 LAB — MAGNESIUM: Magnesium: 2 mg/dL (ref 1.7–2.4)

## 2020-02-05 MED ORDER — FUROSEMIDE 40 MG PO TABS
40.0000 mg | ORAL_TABLET | Freq: Every day | ORAL | Status: DC
  Administered 2020-02-05 – 2020-02-07 (×3): 40 mg via ORAL
  Filled 2020-02-05 (×3): qty 1

## 2020-02-05 NOTE — Progress Notes (Signed)
PROGRESS NOTE    Gary Butler  [id number] DOB: July 14, 1945 DOA: 01/30/2020 PCP: [name], MD  Brief Narrative:HPI: Gary Butler is a 74/M chronically ill with history significant of OSA; psoriatic arthritis; morbid obesity (BMI 40); HTN; DM; BPH, chronic diastolic CHF (0000000 echo with preserved EF and grade 2 diastolic dysfunction) presentedwith nausea and anorexia.   -prior admission from 9/27-10/5 with perirectal abscess s/p I&D. he also had new onset afib and was started on Eliquis. --Last admitted from 10/9-13 with hematochezia; this was felt to be related to recent perineal surgery site bleeding and Eliquis was resumed at hospital d/c.  He was discharged to [facility] for rehab and was discharged to home in late November.   He has recently been referred to palliative care as an outpatient.  - He has had 3 recent ER visits to [facility] hospital most recently on 12/10.  He reported 8 months of anorexia, nausea and post-meal emesis.   -Progressively worsened, has been on ice chips for several weeks, lost about 100 pounds of weight  ED Course:  In the ED noted to have A. fib RVR, severe hypokalemia -CT abdomen pelvis concerning for emphysematous/gangrenous cholecystitis -Seen by general surgery and cardiology for clearance  Assessment & Plan:   Sepsis Gangrenous/emphysematous cholecystitis -General surgery following, and seen by cardiology for clearance -Ongoing lingering symptoms for 6 to 8 months, suspect acute on chronic cholecystitis HIDA scan abnormal. Status post laparoscopic cholecystectomy on 02/02/2020 by Dr. Grandville Silos. Continue IV antibiotics for another day per general surgery.  Tolerating regular diet.  Management per general surgery.  Recent perirectal abscess  -With perirectal thickening on CT -Antibiotics as above, I wonder if this is unexpected in the setting of recent abscess and surgery  A. fib with RVR Chronic diastolic CHF -Rates much better  controlled. Continue Eliquis, bisoprolol and Cardizem CD 120 mg p.o. daily.   Type 2 diabetes mellitus -Jardiance on hold, continue sliding scale insulin.  Blood sugar controlled.  Mild dementia/cognitive deficits: Noted however patient seems to be completely alert and oriented.  Morbid obesity Obstructive sleep apnea -Has lost 100 pounds and needs continued weight loss -Has not been using BiPAP/CPAP following weight loss  Goals of care DO NOT RESUSCITATE -Followed by palliative care, not under hospice  Deconditioning: Seen by PT OT and they recommend SNF.  Patient initially reluctant for that but after further discussion and counseling, he is agreeable to do that.  TOC working on finding placement.  Vertigo: Continue meclizine as recommended by PT.  His symptoms are likely secondary to BPPV.  PT is working with him.  Fluid overload: Patient has +2 pitting edema bilateral lower extremity.  It appears that he was taking Lasix 20 mg p.o. daily which was on hold for several days.  He received 1 dose of IV Lasix on 02/04/2020.  Starting today, I will start him on Lasix 40 mg p.o. daily.  Possible peripheral neuropathy: Patient is very tender to touch on both lower extremities.  Likely undiagnosed peripheral neuropathy in a patient who already has diabetes.  Will need to be started on gabapentin at the time of discharge.  Hypomagnesemia/hypokalemia: Replaced yesterday.  Recheck labs today.  DVT prophylaxis: Heparin subcu, will transition to Eliquis Code Status: DNR Family Communication: No family at bedside  Disposition Plan:  Status is: Inpatient  Remains inpatient appropriate because:Inpatient level of care appropriate due to severity of illness   Dispo: The patient is from: Home  Anticipated d/c is to: May need rehab              Anticipated d/c date is: 1 to 2 days              Patient currently is not medically stable to d/c.  Consultants:   General  surgery  Cardiology   Procedures:   Antimicrobials:    Subjective: Patient seen and examined.  He has no complaints.  Objective: Vitals:   02/05/20 0448 02/05/20 0450 02/05/20 0451 02/05/20 0805  BP: 114/72 114/72  106/85  Pulse: (!) 107 (!) 111 (!) 104 94  Resp: 20 20 20 20   Temp: 97.6 F (36.4 C)   97.7 F (36.5 C)  TempSrc: Oral   Oral  SpO2: 98% 97% 95% 97%  Weight: 126.6 kg     Height:        Intake/Output Summary (Last 24 hours) at 02/05/2020 0953 Last data filed at 02/05/2020 [id number] Gross per 24 hour  Intake 2350.7 ml  Output 2875 ml  Net -524.3 ml   Filed Weights   02/02/20 0500 02/04/20 0500 02/05/20 0448  Weight: 119.6 kg 124.2 kg 126.6 kg    Examination:  General exam:   General exam: Appears calm and comfortable, morbidly obese  respiratory system: Clear to auscultation. Respiratory effort normal. Cardiovascular system: S1 & S2 heard, irregularly irregular rate and rhythm. No JVD, murmurs, rubs, gallops or clicks.  +2 pitting edema bilateral lower extremity Gastrointestinal system: Abdomen is nondistended, soft and mild epigastric and right upper quadrant tenderness. No organomegaly or masses felt. Normal bowel sounds heard. Central nervous system: Alert and oriented. No focal neurological deficits. Extremities: Symmetric 5 x 5 power. Skin: No rashes, lesions or ulcers.  Psychiatry: Judgement and insight appear normal. Mood & affect appropriate.    Data Reviewed:   CBC: Recent Labs  Lab 02/01/20 0635 02/02/20 0123 02/03/20 0415 02/04/20 0130 02/05/20 0224  WBC 4.5 4.3 6.0 5.3 3.8*  HGB 13.6 13.9 13.6 12.4* 14.3  HCT 40.4 40.9 40.4 35.2* 38.6*  MCV 90.6 90.1 89.8 88.2 85.0  PLT 111* 96* 94* 81* 58*   Basic Metabolic Panel: Recent Labs  Lab 01/30/20 1246 01/31/20 0346 02/01/20 0635 02/02/20 0123 02/03/20 0415 02/04/20 0130  NA 137 138 137 138 137 138  K 2.7* 2.8* 3.2* 3.5 3.7 3.1*  CL 97* 101 105 106 106 105  CO2 21* 19* 19*  18* 19* 19*  GLUCOSE 186* 167* 131* 123* 177* 139*  BUN 13 13 11 13 10 8   CREATININE 1.19 0.97 0.90 0.88 0.89 0.72  CALCIUM 9.7 8.9 8.5* 8.5* 8.5* 8.3*  MG 1.5*  --   --   --   --  1.6*   GFR: Estimated Creatinine Clearance: 108.1 mL/min (by C-G formula based on SCr of 0.72 mg/dL). Liver Function Tests: Recent Labs  Lab 01/31/20 0346 02/01/20 0635 02/02/20 0123 02/03/20 0415 02/04/20 0130  AST 10* 11* 9* 30 21  ALT 10 9 8 16 15   ALKPHOS 61 58 60 56 49  BILITOT 1.8* 1.6* 1.5* 1.5* 1.0  PROT 4.3* 4.3* 4.1* 4.3* 3.8*  ALBUMIN 2.2* 2.1* 2.1* 2.2* 1.9*   Recent Labs  Lab 01/30/20 1246  LIPASE 48   No results for input(s): AMMONIA in the last 168 hours. Coagulation Profile: Recent Labs  Lab 01/30/20 1246  INR 1.6*   Cardiac Enzymes: No results for input(s): CKTOTAL, CKMB, CKMBINDEX, TROPONINI in the last 168 hours. BNP (last 3 results) No results  for input(s): PROBNP in the last 8760 hours. HbA1C: No results for input(s): HGBA1C in the last 72 hours. CBG: Recent Labs  Lab 02/04/20 0640 02/04/20 1127 02/04/20 1617 02/04/20 2128 02/05/20 0624  GLUCAP 143* 121* 135* 122* 139*   Lipid Profile: No results for input(s): CHOL, HDL, LDLCALC, TRIG, CHOLHDL, LDLDIRECT in the last 72 hours. Thyroid Function Tests: No results for input(s): TSH, T4TOTAL, FREET4, T3FREE, THYROIDAB in the last 72 hours. Anemia Panel: No results for input(s): VITAMINB12, FOLATE, FERRITIN, TIBC, IRON, RETICCTPCT in the last 72 hours. Urine analysis:    Component Value Date/Time   COLORURINE YELLOW 11/09/2019 1937   APPEARANCEUR CLEAR 11/09/2019 1937   LABSPEC <1.005 (L) 11/09/2019 1937   PHURINE 5.5 11/09/2019 1937   GLUCOSEU >=500 (A) 11/09/2019 1937   HGBUR NEGATIVE 11/09/2019 1937   BILIRUBINUR NEGATIVE 11/09/2019 1937   KETONESUR NEGATIVE 11/09/2019 1937   PROTEINUR NEGATIVE 11/09/2019 1937   UROBILINOGEN 1.0 06/10/2013 1000   NITRITE NEGATIVE 11/09/2019 1937   LEUKOCYTESUR  NEGATIVE 11/09/2019 1937   Sepsis Labs: @LABRCNTIP (procalcitonin:4,lacticidven:4)  ) Recent Results (from the past 240 hour(s))  Resp Panel by RT-PCR (Flu A&B, Covid) Nasopharyngeal Swab     Status: None   Collection Time: 01/30/20 12:38 PM   Specimen: Nasopharyngeal Swab; Nasopharyngeal(NP) swabs in vial transport medium  Result Value Ref Range Status   SARS Coronavirus 2 by RT PCR NEGATIVE NEGATIVE Final    Comment: (NOTE) SARS-CoV-2 target nucleic acids are NOT DETECTED.  The SARS-CoV-2 RNA is generally detectable in upper respiratory specimens during the acute phase of infection. The lowest concentration of SARS-CoV-2 viral copies this assay can detect is 138 copies/mL. A negative result does not preclude SARS-Cov-2 infection and should not be used as the sole basis for treatment or other patient management decisions. A negative result may occur with  improper specimen collection/handling, submission of specimen other than nasopharyngeal swab, presence of viral mutation(s) within the areas targeted by this assay, and inadequate number of viral copies(<138 copies/mL). A negative result must be combined with clinical observations, patient history, and epidemiological information. The expected result is Negative.  Fact Sheet for Patients:  EntrepreneurPulse.com.au  Fact Sheet for Healthcare Providers:  IncredibleEmployment.be  This test is no t yet approved or cleared by the [place] FDA and  has been authorized for detection and/or diagnosis of SARS-CoV-2 by FDA under an Emergency Use Authorization (EUA). This EUA will remain  in effect (meaning this test can be used) for the duration of the COVID-19 declaration under Section 564(b)(1) of the Act, 21 U.S.C.section 360bbb-3(b)(1), unless the authorization is terminated  or revoked sooner.       Influenza A by PCR NEGATIVE NEGATIVE Final   Influenza B by PCR NEGATIVE NEGATIVE Final     Comment: (NOTE) The Xpert Xpress SARS-CoV-2/FLU/RSV plus assay is intended as an aid in the diagnosis of influenza from Nasopharyngeal swab specimens and should not be used as a sole basis for treatment. Nasal washings and aspirates are unacceptable for Xpert Xpress SARS-CoV-2/FLU/RSV testing.  Fact Sheet for Patients: EntrepreneurPulse.com.au  Fact Sheet for Healthcare Providers: IncredibleEmployment.be  This test is not yet approved or cleared by the [place] FDA and has been authorized for detection and/or diagnosis of SARS-CoV-2 by FDA under an Emergency Use Authorization (EUA). This EUA will remain in effect (meaning this test can be used) for the duration of the COVID-19 declaration under Section 564(b)(1) of the Act, 21 U.S.C. section 360bbb-3(b)(1), unless the authorization is terminated or  revoked.  Performed at [facility], [place] [address], [place], [place] [address]   Culture, blood (routine x 2)     Status: None   Collection Time: 01/30/20  4:40 PM   Specimen: BLOOD RIGHT FOREARM  Result Value Ref Range Status   Specimen Description BLOOD RIGHT FOREARM  Final   Special Requests   Final    BOTTLES DRAWN AEROBIC AND ANAEROBIC Blood Culture results may not be optimal due to an inadequate volume of blood received in culture bottles   Culture   Final    NO GROWTH 5 DAYS Performed at [facility], [place] [address] [place], [place] [address]    Report Status 02/04/2020 FINAL  Final  Culture, blood (routine x 2)     Status: None   Collection Time: 01/30/20  9:54 PM   Specimen: BLOOD  Result Value Ref Range Status   Specimen Description BLOOD LEFT HAND  Final   Special Requests   Final    BOTTLES DRAWN AEROBIC ONLY Blood Culture adequate volume   Culture   Final    NO GROWTH 5 DAYS Performed at [facility], [place] [address] [place], [place] [address]    Report Status 02/04/2020 FINAL  Final  MRSA PCR  Screening     Status: None   Collection Time: 01/31/20  7:34 AM   Specimen: Nasal Mucosa; Nasopharyngeal  Result Value Ref Range Status   MRSA by PCR NEGATIVE NEGATIVE Final    Comment:        The GeneXpert MRSA Assay (FDA approved for NASAL specimens only), is one component of a comprehensive MRSA colonization surveillance program. It is not intended to diagnose MRSA infection nor to guide or monitor treatment for MRSA infections. Performed at [facility], [place] [address] [place], [place] [address]   Surgical pcr screen     Status: Abnormal   Collection Time: 02/02/20  6:06 AM   Specimen: Nasal Mucosa; Nasal Swab  Result Value Ref Range Status   MRSA, PCR NEGATIVE NEGATIVE Final   Staphylococcus aureus POSITIVE (A) NEGATIVE Final    Comment: (NOTE) The Xpert SA Assay (FDA approved for NASAL specimens in patients 66 years of age and older), is one component of a comprehensive surveillance program. It is not intended to diagnose infection nor to guide or monitor treatment. Performed at [facility], [place] [address] [place], [place] [address]     Radiology Studies: VAS [place] UPPER EXTREMITY VENOUS DUPLEX  Result Date: 02/04/2020 UPPER VENOUS STUDY  Indications: Swelling and pain Other Indications: Previous IV site in area of AC fossa. Limitations: IJV difficult to assess due to beard. Performing Technologist: Rogelia Rohrer  Examination Guidelines: A complete evaluation includes B-mode imaging, spectral Doppler, color Doppler, and power Doppler as needed of all accessible portions of each vessel. Bilateral testing is considered an integral part of a complete examination. Limited examinations for reoccurring indications may be performed as noted.  Right Findings: +----------+------------+---------+-----------+----------+-------+ RIGHT     CompressiblePhasicitySpontaneousPropertiesSummary +----------+------------+---------+-----------+----------+-------+ Subclavian     Full       Yes       Yes                      +----------+------------+---------+-----------+----------+-------+  Left Findings: +----------+------------+---------+-----------+----------+---------------------+ LEFT      CompressiblePhasicitySpontaneousProperties       Summary        +----------+------------+---------+-----------+----------+---------------------+ IJV  Yes       Yes                 Not adequately                                                            assessed due to                                                               beard.         +----------+------------+---------+-----------+----------+---------------------+ Subclavian    Full       Yes       Yes                                    +----------+------------+---------+-----------+----------+---------------------+ Axillary      Full       Yes       Yes                                    +----------+------------+---------+-----------+----------+---------------------+ Brachial      Full       Yes       Yes                                    +----------+------------+---------+-----------+----------+---------------------+ Radial        Full                                                        +----------+------------+---------+-----------+----------+---------------------+ Ulnar         Full                                                        +----------+------------+---------+-----------+----------+---------------------+ Cephalic      Full       Yes       Yes                                    +----------+------------+---------+-----------+----------+---------------------+ Basilic       Full       Yes       Yes                                    +----------+------------+---------+-----------+----------+---------------------+  *See table(s) above for measurements and observations.    Preliminary    Scheduled Meds: . apixaban  5 mg Oral  BID  .  atorvastatin  40 mg Oral Daily  . bisoprolol  5 mg Oral Daily  . Chlorhexidine Gluconate Cloth  6 each Topical Q0600  . diltiazem  120 mg Oral Daily  . feeding supplement (GLUCERNA SHAKE)  237 mL Oral TID BM  . furosemide  40 mg Oral Daily  . insulin aspart  0-15 Units Subcutaneous TID WC  . insulin aspart  0-5 Units Subcutaneous QHS  . mupirocin ointment  1 application Nasal BID  . sodium chloride flush  3 mL Intravenous Q12H  . tamsulosin  0.4 mg Oral Daily   Continuous Infusions: . sodium chloride 100 mL/hr at 02/04/20 2137  . sodium chloride 250 mL (02/02/20 2328)     LOS: 6 days   Time spent: 28 min  Darliss Cheney, MD [facility]  02/05/2020, 9:53 AM

## 2020-02-05 NOTE — Plan of Care (Signed)
Plan of care reviewed.   Problem: Clinical Measurements: Goal: Will remain free from infection Outcome: Progressing   Problem: Clinical Measurements: Goal: Respiratory complications will improve Outcome: Progressing   Problem: Clinical Measurements: Goal: Cardiovascular complication will be avoided Outcome: Progressing   Problem: Activity: Goal: Risk for activity intolerance will decrease Outcome: Progressing   Problem: Elimination: Goal: Will not experience complications related to bowel motility Outcome: Progressing   Problem: Pain Managment: Goal: General experience of comfort will improve Outcome: Progressing:    Problem: Skin Integrity: Goal: Risk for impaired skin integrity will decrease Outcome: Progressing   Pt's been progressing. He's hemodynamically stable. Remains afebrile. Atrial fib on monitor, rated controlled, BP stable. Denied pain. No acute distress noted. We continue to monitor.  Kennyth Lose, RN

## 2020-02-05 NOTE — Progress Notes (Signed)
Patient continues to decline nocturnal PAP. Order discontinued.

## 2020-02-06 ENCOUNTER — Inpatient Hospital Stay (HOSPITAL_COMMUNITY): Payer: Medicare PPO

## 2020-02-06 LAB — BLOOD GAS, ARTERIAL
Acid-base deficit: 1.5 mmol/L (ref 0.0–2.0)
Bicarbonate: 20.6 mmol/L (ref 20.0–28.0)
Drawn by: 27011
FIO2: 21
O2 Saturation: 98.1 %
Patient temperature: 37
pCO2 arterial: 22.9 mmHg — ABNORMAL LOW (ref 32.0–48.0)
pH, Arterial: 7.561 — ABNORMAL HIGH (ref 7.350–7.450)
pO2, Arterial: 100 mmHg (ref 83.0–108.0)

## 2020-02-06 LAB — GLUCOSE, CAPILLARY
Glucose-Capillary: 154 mg/dL — ABNORMAL HIGH (ref 70–99)
Glucose-Capillary: 145 mg/dL — ABNORMAL HIGH (ref 70–99)
Glucose-Capillary: 123 mg/dL — ABNORMAL HIGH (ref 70–99)
Glucose-Capillary: 125 mg/dL — ABNORMAL HIGH (ref 70–99)

## 2020-02-06 LAB — CBC
HCT: 38.7 % — ABNORMAL LOW (ref 39.0–52.0)
Hemoglobin: 14.2 g/dL (ref 13.0–17.0)
MCH: 31.5 pg (ref 26.0–34.0)
MCHC: 36.7 g/dL — ABNORMAL HIGH (ref 30.0–36.0)
MCV: 85.8 fL (ref 80.0–100.0)
Platelets: 76 10*3/uL — ABNORMAL LOW (ref 150–400)
RBC: 4.51 MIL/uL (ref 4.22–5.81)
RDW: 16.1 % — ABNORMAL HIGH (ref 11.5–15.5)
WBC: 3.9 10*3/uL — ABNORMAL LOW (ref 4.0–10.5)
nRBC: 0 % (ref 0.0–0.2)

## 2020-02-06 LAB — CBC WITH DIFFERENTIAL/PLATELET
Abs Immature Granulocytes: 0.03 10*3/uL (ref 0.00–0.07)
Basophils Absolute: 0 10*3/uL (ref 0.0–0.1)
Basophils Relative: 0 %
Eosinophils Absolute: 0.1 10*3/uL (ref 0.0–0.5)
Eosinophils Relative: 2 %
HCT: 39 % (ref 39.0–52.0)
Hemoglobin: 13.4 g/dL (ref 13.0–17.0)
Immature Granulocytes: 1 %
Lymphocytes Relative: 20 %
Lymphs Abs: 0.8 10*3/uL (ref 0.7–4.0)
MCH: 30.2 pg (ref 26.0–34.0)
MCHC: 34.4 g/dL (ref 30.0–36.0)
MCV: 88 fL (ref 80.0–100.0)
Monocytes Absolute: 0.1 10*3/uL (ref 0.1–1.0)
Monocytes Relative: 3 %
Neutro Abs: 2.9 10*3/uL (ref 1.7–7.7)
Neutrophils Relative %: 74 %
Platelets: 71 10*3/uL — ABNORMAL LOW (ref 150–400)
RBC: 4.43 MIL/uL (ref 4.22–5.81)
RDW: 16.1 % — ABNORMAL HIGH (ref 11.5–15.5)
WBC: 3.9 10*3/uL — ABNORMAL LOW (ref 4.0–10.5)
nRBC: 0 % (ref 0.0–0.2)

## 2020-02-06 LAB — BASIC METABOLIC PANEL
Anion gap: 10 (ref 5–15)
BUN: 10 mg/dL (ref 8–23)
CO2: 21 mmol/L — ABNORMAL LOW (ref 22–32)
Calcium: 8.2 mg/dL — ABNORMAL LOW (ref 8.9–10.3)
Chloride: 105 mmol/L (ref 98–111)
Creatinine, Ser: 0.8 mg/dL (ref 0.61–1.24)
GFR, Estimated: >60 mL/min (ref >60)
Glucose, Bld: 128 mg/dL — ABNORMAL HIGH (ref 70–99)
Potassium: 3 mmol/L — ABNORMAL LOW (ref 3.5–5.1)
Sodium: 136 mmol/L (ref 135–145)

## 2020-02-06 LAB — BRAIN NATRIURETIC PEPTIDE: B Natriuretic Peptide: 218.4 pg/mL — ABNORMAL HIGH (ref 0.0–100.0)

## 2020-02-06 LAB — LACTIC ACID, PLASMA
Lactic Acid, Venous: 1.9 mmol/L (ref 0.5–1.9)
Lactic Acid, Venous: 1.7 mmol/L (ref 0.5–1.9)

## 2020-02-06 MED ORDER — DIGOXIN 0.25 MG/ML IJ SOLN
0.2500 mg | Freq: Once | INTRAMUSCULAR | Status: DC
  Filled 2020-02-06: qty 1

## 2020-02-06 MED ORDER — DILTIAZEM HCL ER COATED BEADS 120 MG PO CP24: 240.0000 mg | ORAL_CAPSULE | Freq: Every day | ORAL | Status: DC

## 2020-02-06 MED ORDER — DILTIAZEM HCL ER COATED BEADS 120 MG PO CP24: 120.0000 mg | ORAL_CAPSULE | Freq: Every day | ORAL | Status: DC

## 2020-02-06 MED ORDER — DILTIAZEM HCL ER COATED BEADS 120 MG PO CP24
240.0000 mg | ORAL_CAPSULE | Freq: Every day | ORAL | Status: DC
Start: 2020-02-07 — End: 2020-02-06

## 2020-02-06 MED ORDER — DILTIAZEM HCL ER COATED BEADS 120 MG PO CP24
120.0000 mg | ORAL_CAPSULE | Freq: Once | ORAL | Status: AC
  Administered 2020-02-06: 120 mg via ORAL
  Filled 2020-02-06: qty 1

## 2020-02-06 MED ORDER — DILTIAZEM HCL 60 MG PO TABS
60.0000 mg | ORAL_TABLET | Freq: Four times a day (QID) | ORAL | Status: DC
  Administered 2020-02-06 – 2020-02-07 (×3): 60 mg via ORAL
  Filled 2020-02-06 (×3): qty 1

## 2020-02-06 MED ORDER — DIGOXIN 0.25 MG/ML IJ SOLN
0.2500 mg | Freq: Once | INTRAMUSCULAR | Status: AC
  Administered 2020-02-06: 0.25 mg via INTRAVENOUS
  Filled 2020-02-06: qty 1

## 2020-02-06 MED ORDER — BISOPROLOL FUMARATE 5 MG PO TABS: 5.0000 mg | ORAL_TABLET | Freq: Every day | ORAL | Status: DC

## 2020-02-06 MED ORDER — DIGOXIN 0.25 MG/ML IJ SOLN
0.5000 mg | Freq: Once | INTRAMUSCULAR | Status: AC
  Administered 2020-02-06: 0.5 mg via INTRAVENOUS
  Filled 2020-02-06: qty 2

## 2020-02-06 NOTE — Consult Note (Signed)
Cardiology Consultation:  Patient ID: Gary Butler MRN: [id number]; DOB: Jan 08, 1946  Admit date: 01/30/2020 Date of Consult: 02/06/2020  Primary Care Provider: [name], MD Primary Cardiologist: Shelva Majestic, MD   Patient Profile:  Gary Butler is a 74 y.o. male with a hx of diabetes, hypertension, atrial fibrillation, HFpEF who is being seen today for the evaluation of atrial fibrillation with RVR at the request of Darliss Cheney, MD.  History of Present Illness:  Gary Butler presents was admitted on 01/30/2020 with nausea and vomiting and found to have gangrenous cholecystitis.  Cardiology evaluated him for preoperative assessment and he was cleared to proceed with surgery.  Rate controlled was recommended for his atrial fibrillation.  He underwent laparoscopic cholecystectomy on 02/02/2020.  He is remained on IV antibiotics per surgery.  He first developed atrial fibrillation in October where he was found to have a perirectal abscess.  Apparently that is when he developed atrial fibrillation.  He has been managed with a rate control strategy since then.  He apparently was doing well with rate control with diltiazem but today has had issues with A. fib with RVR.  Per hospital medicine rates have been in the 120 to 140 bpm range without any improvement with diltiazem.  Cardiology was asked to reevaluate.  During my examination he reports he is in no distress.  Heart rate is in the 120 to 140 bpm range.  He denies any chest pain, shortness of breath or palpitations.  He reports no fevers or chills.  He is very weak.  He reports he is basically bedridden.  He does have significant lower extremity edema as well.  Lab work demonstrates creatinine is stable at 0.77.  Bicarbonate is 14.  WBC 3.9.  Hemoglobin 14.2.  Platelets are 76.  He has been started back on Eliquis.  Despite elevated heart rates he is without complaints.   Heart Pathway Score:       Past Medical History: Past Medical History:   Diagnosis Date  . Arthritis   . CHF (congestive heart failure) ([place])   . Constipation   . Diabetes mellitus   . Heart murmur   . History of gout   . History of skin cancer   . Hypertension   . Morbid (severe) obesity due to excess calories ([place])   . OSA (obstructive sleep apnea)   . Psoriatic arthritis ([place])   . Rectal fissure   . Tinnitus     Past Surgical History: Past Surgical History:  Procedure Laterality Date  . addenoids    . ANAL FISTULOTOMY  2019   Dr Drue Flirt, [facility]  . CHOLECYSTECTOMY N/A 02/02/2020   Procedure: LAPAROSCOPIC CHOLECYSTECTOMY;  Surgeon: Georganna Skeans, MD;  Location: [facility];  Service: General;  Laterality: N/A;  . IRRIGATION AND DEBRIDEMENT ABSCESS N/A 11/10/2019   Procedure: INCISION AND DRAINAGE OF HIGH SCROTAL ABSCESS;  Surgeon: Michael Boston, MD;  Location: [facility];  Service: General;  Laterality: N/A;  . JOINT REPLACEMENT  2012   RT KNEE  . LUNG BIOPSY     20 YRS AGO  . RECTAL EXAM UNDER ANESTHESIA N/A 11/10/2019   Procedure: ANORECTAL EXAM UNDER ANESTHESIA;  Surgeon: Michael Boston, MD;  Location: [facility];  Service: General;  Laterality: N/A;  . TONSILLECTOMY    . TOTAL KNEE ARTHROPLASTY Bilateral 06/22/2013   Procedure: LEFT TOTAL KNEE ARTHROPLASTY;  MEDIAL SOFT TISSUE EXPLORATION SAPHENOUS NEURECTOMY RIGHT KNEE;  Surgeon: Mauri Pole, MD;  Location: [facility];  Service: Orthopedics;  Laterality:  Bilateral;     Home Medications:  Prior to Admission medications   Medication Sig Start Date End Date Taking? Authorizing Provider  Albuterol Sulfate (PROAIR RESPICLICK) [id number] (90 Base) MCG/ACT AEPB Inhale 2 puffs into the lungs every 6 (six) hours as needed (for wheezing or shortness of breath). 12/10/19  Yes Ngetich, Dinah C, NP  allopurinol (ZYLOPRIM) 300 MG tablet TAKE 1 TABLET(300 MG) BY MOUTH DAILY Patient taking differently: Take 300 mg by mouth daily. 12/10/19  Yes Ngetich, Dinah C, NP  atorvastatin (LIPITOR) 40 MG tablet TAKE 1 TABLET(40 MG) BY MOUTH  EVERY EVENING 12/10/19  Yes Ngetich, Dinah C, NP  bisoprolol (ZEBETA) 5 MG tablet Take 0.5 tablets (2.5 mg total) by mouth daily. Patient taking differently: Take 5 mg by mouth daily. 12/10/19 02/08/20 Yes Ngetich, Dinah C, NP  cetirizine (ZYRTEC) 10 MG tablet TAKE 1 TABLET(10 MG) BY MOUTH DAILY Patient taking differently: Take by mouth daily. 02/09/19  Yes [name], MD  Cholecalciferol (VITAMIN D) 2000 UNITS tablet Take 2,000 Units by mouth daily.   Yes [provider]  Cyanocobalamin (B-12) 2000 MCG TABS Take 2,000 mcg by mouth daily.   Yes [provider]  donepezil (ARICEPT) 10 MG tablet TAKE 1 TABLET(10 MG) BY MOUTH AT BEDTIME Patient taking differently: Take by mouth at bedtime. 5 mg at bedtime 12/10/19  Yes Ngetich, Dinah C, NP  fluocinonide cream (LIDEX) [id number] % Apply 1 application topically 2 (two) times daily. To arms for psoriasis 12/10/19  Yes Ngetich, Dinah C, NP  fluticasone (FLONASE) 50 MCG/ACT nasal spray Place 2 sprays into both nostrils daily. 12/10/19  Yes Ngetich, Dinah C, NP  folic acid (FOLVITE) 1 MG tablet Take 1 mg by mouth daily. 04/29/12  Yes [provider]  furosemide (LASIX) 20 MG tablet TAKE 1 TABLET(20 MG) BY MOUTH DAILY Patient taking differently: Take by mouth daily. 12/10/19  Yes Ngetich, Dinah C, NP  JARDIANCE 10 MG TABS tablet TAKE 1 TABLET BY MOUTH DAILY BEFORE BREAKFAST Patient taking differently: Take 10 mg by mouth daily. 12/15/19  Yes [name], MD  meloxicam (MOBIC) 15 MG tablet Take 15 mg by mouth daily.   Yes [provider]  nystatin ointment (MYCOSTATIN) Apply 1 application topically daily. Apply to groin and perineal area 12/10/19  Yes Ngetich, Dinah C, NP  omeprazole (PRILOSEC) 40 MG capsule TAKE 1 CAPSULE BY MOUTH 30 TO 60 MINUTES BEFORE YOUR FIRST AND LAST MEAL OF THE DAY Patient taking differently: Take 40 mg by mouth daily. 12/10/19  Yes Ngetich, Dinah C, NP  pioglitazone (ACTOS) 30 MG tablet  Take 30 mg by mouth daily. 01/01/20  Yes [provider]  potassium chloride (MICRO-K) 10 MEQ CR capsule TAKE 1 CAPSULE BY MOUTH EVERY DAY Patient taking differently: Take 20 mEq by mouth daily. 12/10/19  Yes Ngetich, Dinah C, NP  promethazine (PHENERGAN) 25 MG tablet Take 1 tablet (25 mg total) by mouth every 8 (eight) hours as needed for nausea or vomiting. Alternate w/ Ondansetron (Zofran) 01/27/20  Yes [name], MD  psyllium (HYDROCIL/METAMUCIL) 95 % PACK Take 1 packet by mouth 2 (two) times daily. 11/15/19 02/13/20 Yes Kayleen Memos, DO  tamsulosin (FLOMAX) 0.4 MG CAPS capsule Take 0.4 mg by mouth daily.   Yes [provider]  tiZANidine (ZANAFLEX) 4 MG tablet Take 0.5-1 tablets (2-4 mg total) by mouth every 8 (eight) hours as needed for muscle spasms. Patient taking differently: Take 4 mg by mouth daily as needed for  muscle spasms. spasms 12/28/19  Yes Brunetta Jeans, PA-C    Inpatient Medications: Scheduled Meds: . apixaban  5 mg Oral BID  . atorvastatin  40 mg Oral Daily  . [START ON 02/07/2020] bisoprolol  5 mg Oral Daily  . Chlorhexidine Gluconate Cloth  6 each Topical Q0600  . digoxin  0.5 mg Intravenous Once   Followed by  . digoxin  0.25 mg Intravenous Once  . diltiazem  60 mg Oral Q6H  . feeding supplement (GLUCERNA SHAKE)  237 mL Oral TID BM  . furosemide  40 mg Oral Daily  . insulin aspart  0-15 Units Subcutaneous TID WC  . insulin aspart  0-5 Units Subcutaneous QHS  . mupirocin ointment  1 application Nasal BID  . sodium chloride flush  3 mL Intravenous Q12H  . tamsulosin  0.4 mg Oral Daily   Continuous Infusions: . sodium chloride 250 mL (02/02/20 2328)   PRN Meds: sodium chloride, acetaminophen **OR** acetaminophen, meclizine, morphine injection, ondansetron **OR** ondansetron (ZOFRAN) IV, oxyCODONE, promethazine, tiZANidine  Allergies:    Allergies  Allergen Reactions  . Penicillins Hives    Patient reports full body hives that  required medical treatment when he was in his 28s or 101s. He tolerated cephalosporins.   [name] [Pregabalin] Swelling  . Levofloxacin Swelling    Social History:   Social History   Socioeconomic History  . Marital status: Married    Spouse name: Not on file  . Number of children: 2  . Years of education: Not on file  . Highest education level: Not on file  Occupational History  . 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 Smoking status: Never Smoker  . Smokeless tobacco: Never Used  Vaping Use  . Vaping Use: Never used  Substance and Sexual Activity  . Alcohol use: Yes    Alcohol/week: 0.0 standard drinks    Comment: Once a month  . Drug use: No  . Sexual activity: Never  Other Topics Concern  . Not on file  Social History Narrative   Alcohol- 1 to 2 per month.    Social Determinants of Health   Financial Resource Strain: Not on file  Food Insecurity: Not on file  Transportation Needs: Not on file  Physical Activity: Not on file  Stress: Not on file  Social Connections: Not on file  Intimate Partner Violence: Not on file     Family History:    Family History  Problem Relation Age of Onset  . Emphysema Father   . Clotting disorder Father   . Arthritis Father   . Heart disease Mother   . Arthritis Mother   . Arthritis Maternal Grandmother   . Hypertension Maternal Grandmother   . Diabetes Maternal Grandmother   . Arthritis Paternal Grandmother   . Diabetes Maternal Aunt   . Diabetes Paternal Aunt      ROS:  All other ROS reviewed and negative. Pertinent positives noted in the HPI.     Physical Exam/Data:   Vitals:   02/06/20 1200 02/06/20 1215 02/06/20 1230 02/06/20 1245  BP: 95/62 91/61 (!) 85/60 98/71  Pulse: (!) 131 (!) 125 (!) 120 (!) 125  Resp:      Temp:      TempSrc:      SpO2: 97% 90% 93%   Weight:      Height:         Intake/Output Summary (  Last 24 hours) at 02/06/2020  1344 Last data filed at 02/06/2020 1108 Gross per 24 hour  Intake 370 ml  Output 750 ml  Net -380 ml    Last 3 Weights 02/06/2020 02/05/2020 02/04/2020  Weight (lbs) 279 lb 1.6 oz 279 lb 1.6 oz 273 lb 12.8 oz  Weight (kg) 126.6 kg 126.6 kg 124.195 kg    Body mass index is 38.93 kg/m.  General: Well nourished, well developed, in no acute distress Head: Atraumatic, normal size  Eyes: PEERLA, EOMI  Neck: Supple, JVD 7 to 8 cm of water Endocrine: No thryomegaly Cardiac: Normal S1, S2; irregular rhythm, no murmurs rubs or gallops Lungs: Diminished breath sounds bilaterally Abd: Distended abdomen Ext: 2+ pitting edema Musculoskeletal: No deformities, BUE and BLE strength normal and equal Skin: Warm and dry, no rashes   Neuro: Alert and oriented to person, place, time, and situation, CNII-XII grossly intact, no focal deficits  Psych: Normal mood and affect   EKG:  The EKG was personally reviewed and demonstrates: A. fib with RVR, 126 bpm Telemetry:  Telemetry was personally reviewed and demonstrates: A. fib with RVR  Relevant CV Studies: TTE 11/11/2019 1. Left ventricular ejection fraction, by estimation, is 60 to 65%. The  left ventricle has normal function. The left ventricle has no regional  wall motion abnormalities. Left ventricular diastolic parameters are  consistent with Grade II diastolic  dysfunction (pseudonormalization).  2. Right ventricular systolic function is normal. The right ventricular  size is normal.  3. The mitral valve is normal in structure. No evidence of mitral valve  regurgitation. No evidence of mitral stenosis.  4. The aortic valve is normal in structure. Aortic valve regurgitation is  not visualized. No aortic stenosis is present.  5. The inferior vena cava is normal in size with greater than 50%  respiratory variability, suggesting right atrial pressure of 3 mmHg.   Laboratory Data: High Sensitivity Troponin:   Recent Labs  Lab  01/30/20 1246 01/30/20 1640  TROPONINIHS 28* 29*     Cardiac EnzymesNo results for input(s): TROPONINI in the last 168 hours. No results for input(s): TROPIPOC in the last 168 hours.  Chemistry Recent Labs  Lab 02/03/20 0415 02/04/20 0130 02/05/20 1007  NA 137 138 136  K 3.7 3.1* 4.9  CL 106 105 108  CO2 19* 19* 14*  GLUCOSE 177* 139* 128*  BUN 10 8 11   CREATININE 0.89 0.72 0.77  CALCIUM 8.5* 8.3* 8.0*  GFRNONAA >60 >60 >60  ANIONGAP 12 14 14     Recent Labs  Lab 02/02/20 0123 02/03/20 0415 02/04/20 0130  PROT 4.1* 4.3* 3.8*  ALBUMIN 2.1* 2.2* 1.9*  AST 9* 30 21  ALT 8 16 15   ALKPHOS 60 56 49  BILITOT 1.5* 1.5* 1.0   Hematology Recent Labs  Lab 02/04/20 0130 02/05/20 0224 02/06/20 0130  WBC 5.3 3.8* 3.9*  RBC 3.99* 4.54 4.51  HGB 12.4* 14.3 14.2  HCT 35.2* 38.6* 38.7*  MCV 88.2 85.0 85.8  MCH 31.1 31.5 31.5  MCHC 35.2 37.0* 36.7*  RDW 16.0* 15.9* 16.1*  PLT 81* 58* 76*   BNPNo results for input(s): BNP, PROBNP in the last 168 hours.  DDimer No results for input(s): DDIMER in the last 168 hours.  Radiology/Studies:  VAS [place] UPPER EXTREMITY VENOUS DUPLEX  Result Date: 02/04/2020 UPPER VENOUS STUDY  Indications: Swelling and pain Other Indications: Previous IV site in area of AC fossa. Limitations: IJV difficult to assess due to beard. Performing Technologist: Jeral Fruit  Hill  Examination Guidelines: A complete evaluation includes B-mode imaging, spectral Doppler, color Doppler, and power Doppler as needed of all accessible portions of each vessel. Bilateral testing is considered an integral part of a complete examination. Limited examinations for reoccurring indications may be performed as noted.  Right Findings: +----------+------------+---------+-----------+----------+-------+ RIGHT     CompressiblePhasicitySpontaneousPropertiesSummary +----------+------------+---------+-----------+----------+-------+ Subclavian    Full       Yes       Yes                       +----------+------------+---------+-----------+----------+-------+  Left Findings: +----------+------------+---------+-----------+----------+---------------------+ LEFT      CompressiblePhasicitySpontaneousProperties       Summary        +----------+------------+---------+-----------+----------+---------------------+ IJV                      Yes       Yes                 Not adequately                                                            assessed due to                                                               beard.         +----------+------------+---------+-----------+----------+---------------------+ Subclavian    Full       Yes       Yes                                    +----------+------------+---------+-----------+----------+---------------------+ Axillary      Full       Yes       Yes                                    +----------+------------+---------+-----------+----------+---------------------+ Brachial      Full       Yes       Yes                                    +----------+------------+---------+-----------+----------+---------------------+ Radial        Full                                                        +----------+------------+---------+-----------+----------+---------------------+ Ulnar         Full                                                        +----------+------------+---------+-----------+----------+---------------------+  Cephalic      Full       Yes       Yes                                    +----------+------------+---------+-----------+----------+---------------------+ Basilic       Full       Yes       Yes                                    +----------+------------+---------+-----------+----------+---------------------+  *See table(s) above for measurements and observations.    Preliminary     Assessment and Plan:   1.  A. fib with RVR -Limited by hypotension.  Now  acidotic with a bicarb of 14.  Something is changed.  I think he needs an infectious work-up.  I have ordered blood cultures.  He is volume overloaded but this is likely related to low albumin with an albumin of 1.9.  I ordered a BNP.  I have also ordered a repeat limited echocardiogram to evaluate his LV function. -Given his bicarb 14 I have ordered an ABG. -He is on Eliquis 5 mg twice daily and should continue this. -We will change him over to diltiazem 60 mg every 6 hours for better rate control and titrate up. -We'll go ahead and load him with digoxin 0.5 mg as a one-time dose and then 0.25 mg as a one-time dose 6 hours later.  Dig level in the morning.  Likely will pursue 0.125 mg daily. -Would recommend work-up for hypotension including infectious work-up.  I have ordered a chest x-ray, blood cultures and ABG. -I have also ordered a urinalysis.  2.  Volume overload/anasarca -Albumin 1.9.  I have ordered a BMP.  Also limited echo.  I really think this is all related to hypoalbuminemia.  Would recommend more aggressive diuresis as blood pressure improves.  For questions or updates, please contact [facility] Please consult [email or web address] for contact info under   Signed, [place] Bells T. Audie Box, [place]  02/06/2020 1:44 PM

## 2020-02-06 NOTE — Progress Notes (Addendum)
PROGRESS NOTE    Gary Butler  [id number] DOB: Jun 14, 1945 DOA: 01/30/2020 PCP: [name], MD  Brief Narrative:HPI: Gary Butler is a 74/M chronically ill with history significant of OSA; psoriatic arthritis; morbid obesity (BMI 40); HTN; DM; BPH, chronic diastolic CHF (0000000 echo with preserved EF and grade 2 diastolic dysfunction) presentedwith nausea and anorexia.   -prior admission from 9/27-10/5 with perirectal abscess s/p I&D. he also had new onset afib and was started on Eliquis. --Last admitted from 10/9-13 with hematochezia; this was felt to be related to recent perineal surgery site bleeding and Eliquis was resumed at hospital d/c.  He was discharged to [facility] for rehab and was discharged to home in late November.   He has recently been referred to palliative care as an outpatient.  - He has had 3 recent ER visits to [facility] hospital most recently on 12/10.  He reported 8 months of anorexia, nausea and post-meal emesis.   -Progressively worsened, has been on ice chips for several weeks, lost about 100 pounds of weight  ED Course:  In the ED noted to have A. fib RVR, severe hypokalemia -CT abdomen pelvis concerning for emphysematous/gangrenous cholecystitis -Seen by general surgery and cardiology for clearance  Assessment & Plan:   Sepsis Gangrenous/emphysematous cholecystitis -General surgery following, and seen by cardiology for clearance -Ongoing lingering symptoms for 6 to 8 months, suspect acute on chronic cholecystitis HIDA scan abnormal. Status post laparoscopic cholecystectomy on 02/02/2020 by Dr. Grandville Silos. Continue IV antibiotics for another day per general surgery.  Tolerating regular diet.  General surgery signed off.  Recent perirectal abscess  -With perirectal thickening on CT -Antibiotics as above, I wonder if this is unexpected in the setting of recent abscess and surgery  A. fib with RVR Chronic diastolic CHF Patient has been having  RVR with rates around 130 however despite of this, he has no complaints such as chest pain or shortness of breath.  Patient received his Cardizem 120 mg this morning.  We will give him another dose of 120 mg and monitor.  He already received his appetite as well.  If remains in RVR, may need to consider switching him to IV Cardizem again.  Continue Eliquis.  Type 2 diabetes mellitus -Jardiance on hold, continue sliding scale insulin.  Blood sugar controlled.  Mild dementia/cognitive deficits: Noted however patient seems to be completely alert and oriented.  Morbid obesity Obstructive sleep apnea -Has lost 100 pounds and needs continued weight loss -Has not been using BiPAP/CPAP following weight loss  Goals of care DO NOT RESUSCITATE -Followed by palliative care, not under hospice  Deconditioning: Seen by PT OT and they recommend SNF.  Patient initially reluctant for that but after further discussion and counseling, he is agreeable to do that.  TOC working on finding placement.  Vertigo: Continue meclizine as recommended by PT.  His symptoms are likely secondary to BPPV.  PT is working with him.  His symptoms are improving.  Fluid overload: Patient has +2 pitting edema bilateral lower extremity.  Continue oral Lasix 40 mg p.o. daily  Possible peripheral neuropathy: Patient is very tender to touch on both lower extremities.  Likely undiagnosed peripheral neuropathy in a patient who already has diabetes.  Will need to be started on gabapentin at the time of discharge.  Hypomagnesemia/hypokalemia: Resolved.  Recheck labs in the morning  DVT prophylaxis: Eliquis Code Status: DNR Family Communication: No family at bedside  Disposition Plan:  Status is: Inpatient  Remains  inpatient appropriate because:Inpatient level of care appropriate due to severity of illness   Dispo: The patient is from: Home              Anticipated d/c is to: May need rehab              Anticipated d/c date is: 1  to 2 days              Patient currently is not medically stable to d/c.  Consultants:   General surgery  Cardiology   Procedures:   Antimicrobials:    Subjective: Seen and examined.  No complaints.  Denied any chest pain or shortness of breath.  Objective: Vitals:   02/05/20 2300 02/06/20 0455 02/06/20 0810 02/06/20 1103  BP: 102/72 103/68 100/71 124/69  Pulse: (!) 107 (!) 113 (!) 133 (!) 119  Resp: 17 17 18 17   Temp: (!) 97.5 F (36.4 C) 97.7 F (36.5 C) (!) 97.3 F (36.3 C) (!) 97.4 F (36.3 C)  TempSrc: Oral Oral Oral Axillary  SpO2: 98% 93% 96% 95%  Weight:  126.6 kg    Height:        Intake/Output Summary (Last 24 hours) at 02/06/2020 1129 Last data filed at 02/06/2020 1108 Gross per 24 hour  Intake 370 ml  Output 750 ml  Net -380 ml   Filed Weights   02/04/20 0500 02/05/20 0448 02/06/20 0455  Weight: 124.2 kg 126.6 kg 126.6 kg    Examination:  General exam:   General exam: Appears calm and comfortable  Respiratory system: Clear to auscultation. Respiratory effort normal. Cardiovascular system: S1 & S2 heard, irregularly irregular rate and rhythm. No JVD, murmurs, rubs, gallops or clicks.  +2 pitting edema bilateral lower extremity Gastrointestinal system: Abdomen is nondistended, soft and nontender. No organomegaly or masses felt. Normal bowel sounds heard. Central nervous system: Alert and oriented. No focal neurological deficits. Extremities: Symmetric 5 x 5 power. Skin: No rashes, lesions or ulcers.  Psychiatry: Judgement and insight appear normal. Mood & affect appropriate.   Data Reviewed:   CBC: Recent Labs  Lab 02/02/20 0123 02/03/20 0415 02/04/20 0130 02/05/20 0224 02/06/20 0130  WBC 4.3 6.0 5.3 3.8* 3.9*  HGB 13.9 13.6 12.4* 14.3 14.2  HCT 40.9 40.4 35.2* 38.6* 38.7*  MCV 90.1 89.8 88.2 85.0 85.8  PLT 96* 94* 81* 58* 76*   Basic Metabolic Panel: Recent Labs  Lab 01/30/20 1246 01/31/20 0346 02/01/20 0635 02/02/20 0123  02/03/20 0415 02/04/20 0130 02/05/20 1007  NA 137   < > 137 138 137 138 136  K 2.7*   < > 3.2* 3.5 3.7 3.1* 4.9  CL 97*   < > 105 106 106 105 108  CO2 21*   < > 19* 18* 19* 19* 14*  GLUCOSE 186*   < > 131* 123* 177* 139* 128*  BUN 13   < > 11 13 10 8 11   CREATININE 1.19   < > 0.90 0.88 0.89 0.72 0.77  CALCIUM 9.7   < > 8.5* 8.5* 8.5* 8.3* 8.0*  MG 1.5*  --   --   --   --  1.6* 2.0   < > = values in this interval not displayed.   GFR: Estimated Creatinine Clearance: 108.1 mL/min (by C-G formula based on SCr of 0.77 mg/dL). Liver Function Tests: Recent Labs  Lab 01/31/20 0346 02/01/20 0635 02/02/20 0123 02/03/20 0415 02/04/20 0130  AST 10* 11* 9* 30 21  ALT 10 9 8  16  15  ALKPHOS 61 58 60 56 49  BILITOT 1.8* 1.6* 1.5* 1.5* 1.0  PROT 4.3* 4.3* 4.1* 4.3* 3.8*  ALBUMIN 2.2* 2.1* 2.1* 2.2* 1.9*   Recent Labs  Lab 01/30/20 1246  LIPASE 48   No results for input(s): AMMONIA in the last 168 hours. Coagulation Profile: Recent Labs  Lab 01/30/20 1246  INR 1.6*   Cardiac Enzymes: No results for input(s): CKTOTAL, CKMB, CKMBINDEX, TROPONINI in the last 168 hours. BNP (last 3 results) No results for input(s): PROBNP in the last 8760 hours. HbA1C: No results for input(s): HGBA1C in the last 72 hours. CBG: Recent Labs  Lab 02/05/20 1053 02/05/20 1719 02/05/20 2121 02/06/20 0613 02/06/20 1109  GLUCAP 140* 133* 123* 154* 145*   Lipid Profile: No results for input(s): CHOL, HDL, LDLCALC, TRIG, CHOLHDL, LDLDIRECT in the last 72 hours. Thyroid Function Tests: No results for input(s): TSH, T4TOTAL, FREET4, T3FREE, THYROIDAB in the last 72 hours. Anemia Panel: No results for input(s): VITAMINB12, FOLATE, FERRITIN, TIBC, IRON, RETICCTPCT in the last 72 hours. Urine analysis:    Component Value Date/Time   COLORURINE YELLOW 11/09/2019 1937   APPEARANCEUR CLEAR 11/09/2019 1937   LABSPEC <1.005 (L) 11/09/2019 1937   PHURINE 5.5 11/09/2019 1937   GLUCOSEU >=500 (A)  11/09/2019 1937   HGBUR NEGATIVE 11/09/2019 1937   BILIRUBINUR NEGATIVE 11/09/2019 1937   KETONESUR NEGATIVE 11/09/2019 1937   PROTEINUR NEGATIVE 11/09/2019 1937   UROBILINOGEN 1.0 06/10/2013 1000   NITRITE NEGATIVE 11/09/2019 1937   LEUKOCYTESUR NEGATIVE 11/09/2019 1937   Sepsis Labs: @LABRCNTIP (procalcitonin:4,lacticidven:4)  ) Recent Results (from the past 240 hour(s))  Resp Panel by RT-PCR (Flu A&B, Covid) Nasopharyngeal Swab     Status: None   Collection Time: 01/30/20 12:38 PM   Specimen: Nasopharyngeal Swab; Nasopharyngeal(NP) swabs in vial transport medium  Result Value Ref Range Status   SARS Coronavirus 2 by RT PCR NEGATIVE NEGATIVE Final    Comment: (NOTE) SARS-CoV-2 target nucleic acids are NOT DETECTED.  The SARS-CoV-2 RNA is generally detectable in upper respiratory specimens during the acute phase of infection. The lowest concentration of SARS-CoV-2 viral copies this assay can detect is 138 copies/mL. A negative result does not preclude SARS-Cov-2 infection and should not be used as the sole basis for treatment or other patient management decisions. A negative result may occur with  improper specimen collection/handling, submission of specimen other than nasopharyngeal swab, presence of viral mutation(s) within the areas targeted by this assay, and inadequate number of viral copies(<138 copies/mL). A negative result must be combined with clinical observations, patient history, and epidemiological information. The expected result is Negative.  Fact Sheet for Patients:  EntrepreneurPulse.com.au  Fact Sheet for Healthcare Providers:  IncredibleEmployment.be  This test is no t yet approved or cleared by the [place] FDA and  has been authorized for detection and/or diagnosis of SARS-CoV-2 by FDA under an Emergency Use Authorization (EUA). This EUA will remain  in effect (meaning this test can be used) for the duration of  the COVID-19 declaration under Section 564(b)(1) of the Act, 21 U.S.C.section 360bbb-3(b)(1), unless the authorization is terminated  or revoked sooner.       Influenza A by PCR NEGATIVE NEGATIVE Final   Influenza B by PCR NEGATIVE NEGATIVE Final    Comment: (NOTE) The Xpert Xpress SARS-CoV-2/FLU/RSV plus assay is intended as an aid in the diagnosis of influenza from Nasopharyngeal swab specimens and should not be used as a sole basis for treatment. Nasal washings and aspirates are  unacceptable for Xpert Xpress SARS-CoV-2/FLU/RSV testing.  Fact Sheet for Patients: EntrepreneurPulse.com.au  Fact Sheet for Healthcare Providers: IncredibleEmployment.be  This test is not yet approved or cleared by the [place] FDA and has been authorized for detection and/or diagnosis of SARS-CoV-2 by FDA under an Emergency Use Authorization (EUA). This EUA will remain in effect (meaning this test can be used) for the duration of the COVID-19 declaration under Section 564(b)(1) of the Act, 21 U.S.C. section 360bbb-3(b)(1), unless the authorization is terminated or revoked.  Performed at [facility], [place] [address], [place], [place] [address]   Culture, blood (routine x 2)     Status: None   Collection Time: 01/30/20  4:40 PM   Specimen: BLOOD RIGHT FOREARM  Result Value Ref Range Status   Specimen Description BLOOD RIGHT FOREARM  Final   Special Requests   Final    BOTTLES DRAWN AEROBIC AND ANAEROBIC Blood Culture results may not be optimal due to an inadequate volume of blood received in culture bottles   Culture   Final    NO GROWTH 5 DAYS Performed at [facility], [place] [address], [place], [place] [address]    Report Status 02/04/2020 FINAL  Final  Culture, blood (routine x 2)     Status: None   Collection Time: 01/30/20  9:54 PM   Specimen: BLOOD  Result Value Ref Range Status   Specimen Description BLOOD LEFT HAND  Final   Special  Requests   Final    BOTTLES DRAWN AEROBIC ONLY Blood Culture adequate volume   Culture   Final    NO GROWTH 5 DAYS Performed at [facility], [place] [address] [place], [place] [address]    Report Status 02/04/2020 FINAL  Final  MRSA PCR Screening     Status: None   Collection Time: 01/31/20  7:34 AM   Specimen: Nasal Mucosa; Nasopharyngeal  Result Value Ref Range Status   MRSA by PCR NEGATIVE NEGATIVE Final    Comment:        The GeneXpert MRSA Assay (FDA approved for NASAL specimens only), is one component of a comprehensive MRSA colonization surveillance program. It is not intended to diagnose MRSA infection nor to guide or monitor treatment for MRSA infections. Performed at [facility], [place] [address] [place], [place] [address]   Surgical pcr screen     Status: Abnormal   Collection Time: 02/02/20  6:06 AM   Specimen: Nasal Mucosa; Nasal Swab  Result Value Ref Range Status   MRSA, PCR NEGATIVE NEGATIVE Final   Staphylococcus aureus POSITIVE (A) NEGATIVE Final    Comment: (NOTE) The Xpert SA Assay (FDA approved for NASAL specimens in patients 68 years of age and older), is one component of a comprehensive surveillance program. It is not intended to diagnose infection nor to guide or monitor treatment. Performed at [facility], [place] [address], [place],  [address]     Radiology Studies: VAS [place] UPPER EXTREMITY VENOUS DUPLEX  Result Date: 02/04/2020 UPPER VENOUS STUDY  Indications: Swelling and pain Other Indications: Previous IV site in area of AC fossa. Limitations: IJV difficult to assess due to beard. Performing Technologist: Rogelia Rohrer  Examination Guidelines: A complete evaluation includes B-mode imaging, spectral Doppler, color Doppler, and power Doppler as needed of all accessible portions of each vessel. Bilateral testing is considered an integral part of a complete examination. Limited examinations for reoccurring indications may be  performed as noted.  Right Findings: +----------+------------+---------+-----------+----------+-------+ RIGHT     CompressiblePhasicitySpontaneousPropertiesSummary +----------+------------+---------+-----------+----------+-------+ Subclavian    Full       Yes       Yes                      +----------+------------+---------+-----------+----------+-------+  Left Findings: +----------+------------+---------+-----------+----------+---------------------+ LEFT      CompressiblePhasicitySpontaneousProperties       Summary        +----------+------------+---------+-----------+----------+---------------------+ IJV                      Yes       Yes                 Not adequately                                                            assessed due to                                                               beard.         +----------+------------+---------+-----------+----------+---------------------+ Subclavian    Full       Yes       Yes                                    +----------+------------+---------+-----------+----------+---------------------+ Axillary      Full       Yes       Yes                                    +----------+------------+---------+-----------+----------+---------------------+ Brachial      Full       Yes       Yes                                    +----------+------------+---------+-----------+----------+---------------------+ Radial        Full                                                        +----------+------------+---------+-----------+----------+---------------------+ Ulnar         Full                                                        +----------+------------+---------+-----------+----------+---------------------+ Cephalic      Full       Yes       Yes                                    +----------+------------+---------+-----------+----------+---------------------+  Basilic       Full        Yes       Yes                                    +----------+------------+---------+-----------+----------+---------------------+  *See table(s) above for measurements and observations.    Preliminary    Scheduled Meds: . apixaban  5 mg Oral BID  . atorvastatin  40 mg Oral Daily  . bisoprolol  5 mg Oral Daily  . Chlorhexidine Gluconate Cloth  6 each Topical Q0600  . [START ON 02/07/2020] diltiazem  240 mg Oral Daily  . feeding supplement (GLUCERNA SHAKE)  237 mL Oral TID BM  . furosemide  40 mg Oral Daily  . insulin aspart  0-15 Units Subcutaneous TID WC  . insulin aspart  0-5 Units Subcutaneous QHS  . mupirocin ointment  1 application Nasal BID  . sodium chloride flush  3 mL Intravenous Q12H  . tamsulosin  0.4 mg Oral Daily   Continuous Infusions: . sodium chloride 250 mL (02/02/20 2328)     LOS: 7 days   Time spent: 82 min  Darliss Cheney, MD [facility]  02/06/2020, 11:29 AM   Addendum at 12:50 PM: Patient still remains in atrial fibrillation with rates around 130.  No symptoms.  Dropping his blood pressure which is in the 0000000 systolic.  Consulted cardiology and spoke to Dr. Davina Poke to manage A. fib with RVR.

## 2020-02-07 ENCOUNTER — Inpatient Hospital Stay (HOSPITAL_COMMUNITY): Payer: Medicare PPO

## 2020-02-07 DIAGNOSIS — I5031 Acute diastolic (congestive) heart failure: Secondary | ICD-10-CM

## 2020-02-07 LAB — ECHOCARDIOGRAM LIMITED
Height: 71 in
Weight: 4532.66 oz

## 2020-02-07 LAB — PROCALCITONIN: Procalcitonin: <0.1 ng/mL

## 2020-02-07 LAB — BASIC METABOLIC PANEL
Anion gap: 12 (ref 5–15)
BUN: 9 mg/dL (ref 8–23)
CO2: 19 mmol/L — ABNORMAL LOW (ref 22–32)
Calcium: 8.1 mg/dL — ABNORMAL LOW (ref 8.9–10.3)
Chloride: 102 mmol/L (ref 98–111)
Creatinine, Ser: 0.78 mg/dL (ref 0.61–1.24)
GFR, Estimated: >60 mL/min (ref >60)
Glucose, Bld: 138 mg/dL — ABNORMAL HIGH (ref 70–99)
Potassium: 3.2 mmol/L — ABNORMAL LOW (ref 3.5–5.1)
Sodium: 133 mmol/L — ABNORMAL LOW (ref 135–145)

## 2020-02-07 LAB — CBC WITH DIFFERENTIAL/PLATELET
Abs Immature Granulocytes: 0.05 10*3/uL (ref 0.00–0.07)
Basophils Absolute: 0 10*3/uL (ref 0.0–0.1)
Basophils Relative: 1 %
Eosinophils Absolute: 0.2 10*3/uL (ref 0.0–0.5)
Eosinophils Relative: 4 %
HCT: 40.6 % (ref 39.0–52.0)
Hemoglobin: 14 g/dL (ref 13.0–17.0)
Immature Granulocytes: 1 %
Lymphocytes Relative: 26 %
Lymphs Abs: 1 10*3/uL (ref 0.7–4.0)
MCH: 30.6 pg (ref 26.0–34.0)
MCHC: 34.5 g/dL (ref 30.0–36.0)
MCV: 88.6 fL (ref 80.0–100.0)
Monocytes Absolute: 0.1 10*3/uL (ref 0.1–1.0)
Monocytes Relative: 3 %
Neutro Abs: 2.5 10*3/uL (ref 1.7–7.7)
Neutrophils Relative %: 65 %
Platelets: 61 10*3/uL — ABNORMAL LOW (ref 150–400)
RBC: 4.58 MIL/uL (ref 4.22–5.81)
RDW: 15.9 % — ABNORMAL HIGH (ref 11.5–15.5)
WBC: 3.9 10*3/uL — ABNORMAL LOW (ref 4.0–10.5)
nRBC: 0 % (ref 0.0–0.2)

## 2020-02-07 LAB — BLOOD GAS, ARTERIAL
Acid-base deficit: 0.2 mmol/L (ref 0.0–2.0)
Bicarbonate: 22 mmol/L (ref 20.0–28.0)
Drawn by: 535271
FIO2: 28
O2 Saturation: 95.3 %
Patient temperature: 36.7
pCO2 arterial: 24.3 mmHg — ABNORMAL LOW (ref 32.0–48.0)
pH, Arterial: 7.562 — ABNORMAL HIGH (ref 7.350–7.450)
pO2, Arterial: 70.1 mmHg — ABNORMAL LOW (ref 83.0–108.0)

## 2020-02-07 LAB — URINALYSIS, COMPLETE (UACMP) WITH MICROSCOPIC
Bilirubin Urine: NEGATIVE
Glucose, UA: 50 mg/dL — AB
Ketones, ur: 5 mg/dL — AB
Leukocytes,Ua: NEGATIVE
Nitrite: NEGATIVE
Protein, ur: NEGATIVE mg/dL
Specific Gravity, Urine: 1.01 (ref 1.005–1.030)
pH: 5 (ref 5.0–8.0)

## 2020-02-07 LAB — GLUCOSE, CAPILLARY
Glucose-Capillary: 139 mg/dL — ABNORMAL HIGH (ref 70–99)
Glucose-Capillary: 144 mg/dL — ABNORMAL HIGH (ref 70–99)
Glucose-Capillary: 132 mg/dL — ABNORMAL HIGH (ref 70–99)
Glucose-Capillary: 129 mg/dL — ABNORMAL HIGH (ref 70–99)

## 2020-02-07 LAB — DIGOXIN LEVEL: Digoxin Level: 0.7 ng/mL — ABNORMAL LOW (ref 0.8–2.0)

## 2020-02-07 LAB — MAGNESIUM: Magnesium: 1.7 mg/dL (ref 1.7–2.4)

## 2020-02-07 MED ORDER — DIGOXIN 125 MCG PO TABS
0.1250 mg | ORAL_TABLET | Freq: Every day | ORAL | Status: DC
  Administered 2020-02-07 – 2020-02-09 (×3): 0.125 mg via ORAL
  Filled 2020-02-07 (×3): qty 1

## 2020-02-07 MED ORDER — PERFLUTREN LIPID MICROSPHERE
1.0000 mL | INTRAVENOUS | Status: AC | PRN
  Administered 2020-02-07: 3 mL via INTRAVENOUS
  Filled 2020-02-07: qty 10

## 2020-02-07 MED ORDER — POTASSIUM CHLORIDE CRYS ER 20 MEQ PO TBCR
40.0000 meq | EXTENDED_RELEASE_TABLET | ORAL | Status: AC
  Administered 2020-02-07: 40 meq via ORAL
  Filled 2020-02-07: qty 2

## 2020-02-07 MED ORDER — FUROSEMIDE 10 MG/ML IJ SOLN
40.0000 mg | Freq: Once | INTRAMUSCULAR | Status: AC
  Administered 2020-02-07: 40 mg via INTRAVENOUS
  Filled 2020-02-07: qty 4

## 2020-02-07 MED ORDER — DILTIAZEM HCL ER COATED BEADS 120 MG PO CP24
120.0000 mg | ORAL_CAPSULE | Freq: Once | ORAL | Status: AC
  Administered 2020-02-07: 120 mg via ORAL
  Filled 2020-02-07: qty 1

## 2020-02-07 MED ORDER — NYSTATIN 100000 UNIT/GM EX POWD
Freq: Two times a day (BID) | CUTANEOUS | Status: DC
  Filled 2020-02-07 (×2): qty 15

## 2020-02-07 MED ORDER — SODIUM CHLORIDE 0.9 % IV BOLUS
1000.0000 mL | Freq: Once | INTRAVENOUS | Status: AC
  Administered 2020-02-07: 1000 mL via INTRAVENOUS

## 2020-02-07 MED ORDER — MAGNESIUM SULFATE 2 GM/50ML IV SOLN
2.0000 g | Freq: Once | INTRAVENOUS | Status: AC
  Administered 2020-02-07: 2 g via INTRAVENOUS
  Filled 2020-02-07: qty 50

## 2020-02-07 NOTE — Progress Notes (Signed)
  Echocardiogram 2D Echocardiogram has been performed with Definity.  Gary Butler 02/07/2020, 3:13 PM

## 2020-02-07 NOTE — Progress Notes (Addendum)
[place] for apixaban    Indication: nonvalvular atrial fibrillation  Allergies  Allergen Reactions  . Penicillins Hives    Patient reports full body hives that required medical treatment when he was in his 23s or 79s. He tolerated cephalosporins.   [name] [Pregabalin] Swelling  . Levofloxacin Swelling    Patient Measurements: Height: 5\' 11"  (180.3 cm) Weight: 128.5 kg (283 lb 4.7 oz) IBW/kg (Calculated) : 75.3 Heparin Dosing Weight: 101.8 kg  Vital Signs: Temp: 98.1 F (36.7 C) (12/26 1136) Temp Source: Oral (12/26 1136) BP: 92/66 (12/26 1136) Pulse Rate: 98 (12/26 1136)  Labs: Recent Labs    02/05/20 1007 02/06/20 0130 02/06/20 1407 02/07/20 0639  HGB  --  14.2 13.4 14.0  HCT  --  38.7* 39.0 40.6  PLT  --  76* 71* 61*  CREATININE 0.77  --  0.80 0.78    Estimated Creatinine Clearance: 109 mL/min (by C-G formula based on SCr of 0.78 mg/dL).   Assessment: Patient is a 43 yom that is being admitted for Nausea and emphysematous cholecystitis. The patient also presented with Afivb with RVR. Patient was previously on Apixaban prior to admission and rate control medications however stopped the Apixaban secondary to  bleeding.  Was started on IV heparin infusion on 12/18 which was discontinued on the next day 12/19 due to severe gangrenous cholecystitis and risk of bleeding . Transitioned to SQ Heparin for VTE prophylaxis. >> then s/p lap cholecystectomy, cholangiogram on 12/21, On 12/22  Dr. Doristine Bosworth discussed with general surgery who has cleared patient for heparin. Pharmacy was consulted on 02/03/20 to restart IV heparin for Atrial fibrillation.  Heparin level is therapeutic at 0.58 this morning on heparin drip 1250 units/hr.  H/H down slightly to 12.4/35.2.  Thrombocytopenia:  PLTC 139 on admssion>>111>96>94>81k.  No bleeding noted   Goal of Therapy:  Heparin level 0.3-0.7 units/ml Monitor platelets by anticoagulation protocol:  Yes   Plan:  Continue Heparin drip @ 1250 units/hr Daily Heparin level and CBC  Thank you for allowing pharmacy to be part of this patients care team.  Nicole Cella, [place] Pharmacist [phone] Please see amion for complete clinical pharmacist phone list 02/07/2020,12:17 PM   ADDENDUM:  Transition back to Apixaban 02/04/20  The patient was taking Elquis (apixaban) for nonvalvual atrial fibrillation previously although it was held during hospital admission 10/9-10/13/21 due to hematochezia,  this was felt to be related to recent perineal surgery site bleeding and Eliquis was resumed at hospital d/c. He was discharged to [facility] for rehab and was discharged to home in late November 2021.  A review of prior to admission medication history shows Apixaban was held again sometime between 11/15 and 01/13/20.  He was not currently taking apixaban prior to this admission.    Today, Dr. Doristine Bosworth notes that surgery has cleared him for anticoagulation.  Pharmacy consulted to transition from IV heparin to oral Eliquis today for nonvavular atrial fibrillation.  Age<80, weight is >60kg, SCr is <1.5, thus I will restart Eliquis 5 mg BID and stop IV heparin now.  12/26 - Patient continues on Eliquis. No s/sx bleeding noted and CBC is stable.    Plan: Continue Eliquis 5mg  BID.  Monitor CBC and signs/symptoms of bleeding.  Mercy Riding, PharmD PGY1 Acute Care Pharmacy Resident Please refer to [facility] for unit-specific pharmacist

## 2020-02-07 NOTE — Significant Event (Signed)
Rapid Response Event Note   Reason for Call :  Hypotension  Initial Focused Assessment:  I received a call from the RN stating that her patient has been hypotensive and more lethargic than normal.  The patient was resting in his bed when I walked into the room.  He answered all of my questions appropriately, but with a delay and flat affect.  He denies SOB, chest pain, palpitations, nausea, and vomiting.  He does admit that he is dizzy, but states that he has had inner ear problems before, and this dizziness is similar to what he has experienced previously. The patient reported discomfort in his upper and lower left quadrant during palpation, but this could be from his surgery on 12/21.  The MD came into the room while I was conducting my assessment.  BP 93/60 (72) ECG 99 (atrial fib) RR 22 O2 97 (2L [place])   Interventions:  Fluid bolus given, vitals taken, abg ordered, orientation assessed  Plan of Care:  We will see how the patient's BP responds to the fluid bolus.  His BP was getting better while I was in the room.  It is recommended to hold furosemide until his BP gets better.   Event Summary:   MD NotifiedDoristine Bosworth MD Call Time: [place] Time: [id number] End Time: [place], RN

## 2020-02-07 NOTE — Progress Notes (Signed)
Mobility Specialist: Progress Note   02/07/20 1537  Mobility  Activity Stood at bedside  Level of Assistance +2 (takes two people)  Company secretary Response Tolerated poorly  Mobility performed by Mobility specialist  Bed Position Semi-fowlers  $Mobility charge 1 Mobility   Pre-Mobility:            Supine: 108 HR, 89/60 BP, 97% SpO2 During Mobility:            Sitting: 120s-130s HR 99/63 BP             Standing: 65/53 BP  Post-Mobility: 115 HR, 83/62 BP, 92% SpO2  Pt struggled to wake upon entering room. Pt eventually did wake and was willing to do mobility. Pt was maxA to EOB and +2 assist to stand. Pt hypotensive, vitals as seen above. Pt stood for <10 seconds and needed to sit back down, BP 65/53, RN notified. Pt back to bed with call bell at his side and bed alarm on.   [facility] Madellyn Denio Mobility Specialist

## 2020-02-07 NOTE — TOC Progression Note (Signed)
Transition of Care [facility]) - Progression Note    Patient Details  Name: Gary Butler MRN: [id number] Date of Birth: Jan 24, 1946  Transition of Care [facility]) CM/SW [place], [place] Phone Number: [phone] 02/07/2020, 4:34 PM  Clinical Narrative:     CSW attempted several times to alert patient of his co-pay at facility and was he willing to pay. CSW was unsuccessful and the last attempt rapid response was being called due to a low blood pressure.  TOC team will continue to assist with discharge planning needs.    Expected Discharge Plan: [place] Barriers to Discharge: [place] Work up,SNF Pending bed offer (maybe copays status)  Expected Discharge Plan and Services Expected Discharge Plan: [place] In-house Referral: Clinical Social Work     Living arrangements for the past 2 months: Single Family Home                                       Social Determinants of Health (SDOH) Interventions    Readmission Risk Interventions No flowsheet data found.

## 2020-02-07 NOTE — Progress Notes (Signed)
Cardiology Progress Note  Patient ID: Gary Butler MRN: [id number] DOB: 1945-07-05 Date of Encounter: 02/07/2020  Primary Cardiologist: Shelva Majestic, MD  Subjective   Chief Complaint: Sleeping, no complaints  HPI: Rates around 100.  60 mg of diltiazem not given last night.  He was given 60 mg this morning.  Digoxin level acceptable.  Chest x-ray with possible pneumonia in the left lower lobe.  ROS:  All other ROS reviewed and negative. Pertinent positives noted in the HPI.     Inpatient Medications  Scheduled Meds: . apixaban  5 mg Oral BID  . atorvastatin  40 mg Oral Daily  . Chlorhexidine Gluconate Cloth  6 each Topical Q0600  . diltiazem  60 mg Oral Q6H  . feeding supplement (GLUCERNA SHAKE)  237 mL Oral TID BM  . furosemide  40 mg Oral Daily  . insulin aspart  0-15 Units Subcutaneous TID WC  . insulin aspart  0-5 Units Subcutaneous QHS  . sodium chloride flush  3 mL Intravenous Q12H  . tamsulosin  0.4 mg Oral Daily   Continuous Infusions: . sodium chloride 250 mL (02/02/20 2328)   PRN Meds: sodium chloride, acetaminophen **OR** acetaminophen, meclizine, morphine injection, ondansetron **OR** ondansetron (ZOFRAN) IV, oxyCODONE, promethazine, tiZANidine   Vital Signs   Vitals:   02/06/20 2000 02/07/20 0112 02/07/20 0655 02/07/20 0744  BP: 105/60 (!) 100/59 98/66 90/61   Pulse: (!) 104 (!) 112 77 88  Resp: 20 19 20 19   Temp: (!) 97.5 F (36.4 C) 98.2 F (36.8 C) 97.9 F (36.6 C) 97.9 F (36.6 C)  TempSrc: Oral Oral Oral Oral  SpO2: 99% 92% 93% 96%  Weight:   128.5 kg   Height:        Intake/Output Summary (Last 24 hours) at 02/07/2020 0910 Last data filed at 02/06/2020 1108 Gross per 24 hour  Intake 120 ml  Output 150 ml  Net -30 ml   Last 3 Weights 02/07/2020 02/06/2020 02/05/2020  Weight (lbs) 283 lb 4.7 oz 279 lb 1.6 oz 279 lb 1.6 oz  Weight (kg) 128.5 kg 126.6 kg 126.6 kg      Telemetry  Overnight telemetry shows A. fib with heart rate 100 to 140  bpm, which I personally reviewed.   ECG  The most recent ECG shows A. fib, 126 bpm, which I personally reviewed.   Physical Exam   Vitals:   02/06/20 2000 02/07/20 0112 02/07/20 0655 02/07/20 0744  BP: 105/60 (!) 100/59 98/66 90/61   Pulse: (!) 104 (!) 112 77 88  Resp: 20 19 20 19   Temp: (!) 97.5 F (36.4 C) 98.2 F (36.8 C) 97.9 F (36.6 C) 97.9 F (36.6 C)  TempSrc: Oral Oral Oral Oral  SpO2: 99% 92% 93% 96%  Weight:   128.5 kg   Height:         Intake/Output Summary (Last 24 hours) at 02/07/2020 0910 Last data filed at 02/06/2020 1108 Gross per 24 hour  Intake 120 ml  Output 150 ml  Net -30 ml    Last 3 Weights 02/07/2020 02/06/2020 02/05/2020  Weight (lbs) 283 lb 4.7 oz 279 lb 1.6 oz 279 lb 1.6 oz  Weight (kg) 128.5 kg 126.6 kg 126.6 kg    Body mass index is 39.51 kg/m.  General: Well nourished, well developed, in no acute distress Head: Atraumatic, normal size  Eyes: PEERLA, EOMI  Neck: Supple, no JVD Endocrine: No thryomegaly Cardiac: Normal S1, S2; irregular rhythm, no murmurs rubs or gallops Lungs: Diminished breath sounds  bilaterally Abd: Soft, nontender, no hepatomegaly  Ext: 2+ pitting edema Musculoskeletal: No deformities, BUE and BLE strength normal and equal Skin: Warm and dry, no rashes   Neuro: Alert and oriented to person, place, time, and situation, CNII-XII grossly intact, no focal deficits  Psych: Normal mood and affect   Labs  High Sensitivity Troponin:   Recent Labs  Lab 01/30/20 1246 01/30/20 1640  TROPONINIHS 28* 29*     Cardiac EnzymesNo results for input(s): TROPONINI in the last 168 hours. No results for input(s): TROPIPOC in the last 168 hours.  Chemistry Recent Labs  Lab 02/02/20 0123 02/03/20 0415 02/04/20 0130 02/05/20 1007 02/06/20 1407 02/07/20 0639  NA 138 137 138 136 136 133*  K 3.5 3.7 3.1* 4.9 3.0* 3.2*  CL 106 106 105 108 105 102  CO2 18* 19* 19* 14* 21* 19*  GLUCOSE 123* 177* 139* 128* 128* 138*  BUN 13 10 8  11 10 9   CREATININE 0.88 0.89 0.72 0.77 0.80 0.78  CALCIUM 8.5* 8.5* 8.3* 8.0* 8.2* 8.1*  PROT 4.1* 4.3* 3.8*  --   --   --   ALBUMIN 2.1* 2.2* 1.9*  --   --   --   AST 9* 30 21  --   --   --   ALT 8 16 15   --   --   --   ALKPHOS 60 56 49  --   --   --   BILITOT 1.5* 1.5* 1.0  --   --   --   GFRNONAA >60 >60 >60 >60 >60 >60  ANIONGAP 14 12 14 14 10 12     Hematology Recent Labs  Lab 02/06/20 0130 02/06/20 1407 02/07/20 0639  WBC 3.9* 3.9* 3.9*  RBC 4.51 4.43 4.58  HGB 14.2 13.4 14.0  HCT 38.7* 39.0 40.6  MCV 85.8 88.0 88.6  MCH 31.5 30.2 30.6  MCHC 36.7* 34.4 34.5  RDW 16.1* 16.1* 15.9*  PLT 76* 71* 61*   BNP Recent Labs  Lab 02/06/20 1407  BNP 218.4*    DDimer No results for input(s): DDIMER in the last 168 hours.   Radiology  DG CHEST PORT 1 VIEW  Result Date: 02/06/2020 CLINICAL DATA:  Shortness of breath EXAM: PORTABLE CHEST 1 VIEW COMPARISON:  Portable exam 1356 hours compared to 01/30/2020 FINDINGS: Normal heart size, mediastinal contours, and pulmonary vascularity. Opacity in medial LEFT lower lobe question atelectasis or infiltrate. Remaining lungs grossly clear. No pleural effusion or pneumothorax. Osseous structures unremarkable. IMPRESSION: Atelectasis versus infiltrate at medial LEFT lower lobe. Electronically Signed   By: Lavonia Dana M.D.   On: 02/06/2020 14:04    Cardiac Studies  TTE 11/11/2019 1. Left ventricular ejection fraction, by estimation, is 60 to 65%. The  left ventricle has normal function. The left ventricle has no regional  wall motion abnormalities. Left ventricular diastolic parameters are  consistent with Grade II diastolic  dysfunction (pseudonormalization).  2. Right ventricular systolic function is normal. The right ventricular  size is normal.  3. The mitral valve is normal in structure. No evidence of mitral valve  regurgitation. No evidence of mitral stenosis.  4. The aortic valve is normal in structure. Aortic valve  regurgitation is  not visualized. No aortic stenosis is present.  5. The inferior vena cava is normal in size with greater than 50%  respiratory variability, suggesting right atrial pressure of 3 mmHg.   Patient Profile  Gary Butler is a 74 y.o. male with diabetes, hypertension, A.  fib, HFpEF, obesity who was admitted on 01/30/2020 for nausea vomiting found to have gangrenous cholecystitis.  Cardiology initially consulted for preoperative assessment.  Cardiology was reconsulted on 02/06/2020 for A. fib with RVR and low blood pressure.  Assessment & Plan   1.  A. fib with RVR -Lab work shows normal lactic acid.  BNP 218.  Also with soft blood pressures.  Chest x-ray with possible left middle lobe pneumonia.  We will asked hospital medicine to evaluate for possible pneumonia.  This could explain his low blood pressure. -He is grossly volume overloaded.  Most recent albumin 1.9.  I have ordered a repeat echocardiogram.  LV function was normal in September.  We will make sure there is been no significant change. -Digoxin loaded yesterday.  Continue 0.125 mg daily.  Dig level is acceptable. -I have also transition back to diltiazem extended release 120 mg a day.  We can further titrate this up.  I think digoxin will help considerably. -Echocardiogram pending as above. -Continue Eliquis 5 mg twice daily -He was diagnosed with A. fib in October in the setting of perirectal abscess.  Given poor mobility, rate control strategy was recommended.  2.  Volume overloaded/elevated BNP/low albumin/HFpEF -Repeat echocardiogram. -I have ordered 40 mg IV Lasix today. -40 mEq for 2 doses of potassium to replace this.  His potassium has been quite low.  For questions or updates, please contact [place] Please consult [email or web address] for contact info under   Time Spent with Patient: I have spent a total of 25 minutes with patient reviewing hospital notes, telemetry, EKGs, labs and examining the patient as  well as establishing an assessment and plan that was discussed with the patient.  > 50% of time was spent in direct patient care.    Signed, Addison Naegeli. Audie Box, [place]  02/07/2020 9:10 AM

## 2020-02-07 NOTE — Progress Notes (Signed)
PROGRESS NOTE    Gary Butler  [id number] DOB: [birth year] DOA: 01/30/2020 PCP: [name], MD  Brief Narrative:HPI: Gary Butler is a 74/M chronically ill with history significant of OSA; psoriatic arthritis; morbid obesity (BMI 40); HTN; DM; BPH, chronic diastolic CHF (0000000 echo with preserved EF and grade 2 diastolic dysfunction) presentedwith nausea and anorexia.   -prior admission from 9/27-10/5 with perirectal abscess s/p I&D. he also had new onset afib and was started on Eliquis. --Last admitted from 10/9-13 with hematochezia; this was felt to be related to recent perineal surgery site bleeding and Eliquis was resumed at hospital d/c.  He was discharged to [facility] for rehab and was discharged to home in late November.   He has recently been referred to palliative care as an outpatient.  - He has had 3 recent ER visits to [facility] most recently on 12/10.  He reported 8 months of anorexia, nausea and post-meal emesis.   -Progressively worsened, has been on ice chips for several weeks, lost about 100 pounds of weight  ED Course:  In the ED noted to have A. fib RVR, severe hypokalemia -CT abdomen pelvis concerning for emphysematous/gangrenous cholecystitis -Seen by general surgery and cardiology for clearance  Assessment & Plan:   Sepsis Gangrenous/emphysematous cholecystitis -General surgery following, and seen by cardiology for clearance -Ongoing lingering symptoms for 6 to 8 months, suspect acute on chronic cholecystitis HIDA scan abnormal. Status post laparoscopic cholecystectomy on 02/02/2020 by Dr. Grandville Silos. Continue IV antibiotics for another day per general surgery.  Tolerating regular diet.  General surgery signed off.  Recent perirectal abscess  -With perirectal thickening on CT -Antibiotics as above, I wonder if this is unexpected in the setting of recent abscess and surgery  A. fib with RVR Chronic diastolic CHF Due to persistent A. fib  with RVR early morning of 02/06/2020, cardiology was reconsulted.  Patient was already on Cardizem but having high heart rate.  He was started on digoxin and Cardizem was switched to short acting.  Patient this morning has no chest pain or shortness of breath but his rates are still around 110.  Cardiology has switched him back to long-acting Cardizem and continues digoxin as well as Eliquis.  Management deferred to cardiology and appreciate their help.  Type 2 diabetes mellitus -Jardiance on hold, continue sliding scale insulin.  Blood sugar controlled.  Mild dementia/cognitive deficits: Noted however patient seems to be completely alert and oriented.  Abnormal chest x-ray suspecting possible infiltrates: Reviewed cardiology's note as well as x-ray from yesterday.  I personally reviewed x-ray as well.  I am not convinced that patient has infiltrates, he surely has atelectasis and he is a superobese guy who lays in the bed for most of the time.  He has no leukocytosis, no fever and he is not hypoxic either.  I would watch him off of antibiotics.  His low blood pressure is likely secondary to atrial fibrillation and not infection or any pneumonia.  Lactic acid is normal.  We can certainly check procalcitonin to see if that is elevated as it was normal 8 days ago.  Morbid obesity Obstructive sleep apnea -Has lost 100 pounds and needs continued weight loss -Has not been using BiPAP/CPAP following weight loss  Goals of care DO NOT RESUSCITATE -Followed by palliative care, not under hospice  Deconditioning: Seen by PT OT and they recommend SNF.  Patient initially reluctant for that but after further discussion and counseling, he is agreeable to  do that.  TOC working on finding placement.  Vertigo: Continue meclizine as recommended by PT.  His symptoms are likely secondary to BPPV.  PT is working with him.  His symptoms are improving.  Fluid/volume overload: Patient has +2 pitting edema bilateral  lower extremity.  Cardiology has started him on IV diuresis with potassium replacement.  Possible peripheral neuropathy: Patient is very tender to touch on both lower extremities.  Likely undiagnosed peripheral neuropathy in a patient who already has diabetes.  Will need to be started on gabapentin at the time of discharge.  Hypomagnesemia/hypokalemia: Potassium 3.2.  Cardiology has replaced it.  Magnesium normal.  DVT prophylaxis: Eliquis Code Status: DNR Family Communication: No family at bedside  Disposition Plan:  Status is: Inpatient  Remains inpatient appropriate because:Inpatient level of care appropriate due to severity of illness   Dispo: The patient is from: Home              Anticipated d/c is to: May need rehab              Anticipated d/c date is: 1 to 2 days              Patient currently is not medically stable to d/c.  Consultants:   General surgery  Cardiology   Procedures:   Antimicrobials:    Subjective: Patient seen and examined.  He is looking a little weaker and sleepy today.  He states that he did not have a good sleep last night due to a lot of interruptions.  Otherwise he is oriented and other than generalized weakness, he had no specific complaint.  Denied any palpitation, chest pain or shortness of breath.  Objective: Vitals:   02/06/20 2000 02/07/20 0112 02/07/20 0655 02/07/20 0744  BP: 105/60 (!) 100/59 98/66 90/61   Pulse: (!) 104 (!) 112 77 88  Resp: 20 19 20 19   Temp: (!) 97.5 F (36.4 C) 98.2 F (36.8 C) 97.9 F (36.6 C) 97.9 F (36.6 C)  TempSrc: Oral Oral Oral Oral  SpO2: 99% 92% 93% 96%  Weight:   128.5 kg   Height:       No intake or output data in the 24 hours ending 02/07/20 1128 Filed Weights   02/05/20 0448 02/06/20 0455 02/07/20 0655  Weight: 126.6 kg 126.6 kg 128.5 kg    Examination:  General exam:   General exam: Appears calm and comfortable, superobese Respiratory system: Diminished breath sounds at the bases.  Respiratory effort normal. Cardiovascular system: S1 & S2 heard, RRR. No JVD, murmurs, rubs, gallops or clicks.  +2 pitting edema bilateral lower extremity Gastrointestinal system: Abdomen is nondistended, soft and nontender. No organomegaly or masses felt. Normal bowel sounds heard. Central nervous system: Alert and oriented. No focal neurological deficits. Extremities: Symmetric 5 x 5 power. Skin: Multiple bruises in upper extremities Psychiatry: Judgement and insight appear normal. Mood & affect appropriate.    Data Reviewed:   CBC: Recent Labs  Lab 02/04/20 0130 02/05/20 0224 02/06/20 0130 02/06/20 1407 02/07/20 0639  WBC 5.3 3.8* 3.9* 3.9* 3.9*  NEUTROABS  --   --   --  2.9 2.5  HGB 12.4* 14.3 14.2 13.4 14.0  HCT 35.2* 38.6* 38.7* 39.0 40.6  MCV 88.2 85.0 85.8 88.0 88.6  PLT 81* 58* 76* 71* 61*   Basic Metabolic Panel: Recent Labs  Lab 02/03/20 0415 02/04/20 0130 02/05/20 1007 02/06/20 1407 02/07/20 0639  NA 137 138 136 136 133*  K 3.7 3.1* 4.9 3.0* 3.2*  CL 106 105 108 105 102  CO2 19* 19* 14* 21* 19*  GLUCOSE 177* 139* 128* 128* 138*  BUN 10 8 11 10 9   CREATININE 0.89 0.72 0.77 0.80 0.78  CALCIUM 8.5* 8.3* 8.0* 8.2* 8.1*  MG  --  1.6* 2.0  --  1.7   GFR: Estimated Creatinine Clearance: 109 mL/min (by C-G formula based on SCr of 0.78 mg/dL). Liver Function Tests: Recent Labs  Lab 02/01/20 0635 02/02/20 0123 02/03/20 0415 02/04/20 0130  AST 11* 9* 30 21  ALT 9 8 16 15   ALKPHOS 58 60 56 49  BILITOT 1.6* 1.5* 1.5* 1.0  PROT 4.3* 4.1* 4.3* 3.8*  ALBUMIN 2.1* 2.1* 2.2* 1.9*   No results for input(s): LIPASE, AMYLASE in the last 168 hours. No results for input(s): AMMONIA in the last 168 hours. Coagulation Profile: No results for input(s): INR, PROTIME in the last 168 hours. Cardiac Enzymes: No results for input(s): CKTOTAL, CKMB, CKMBINDEX, TROPONINI in the last 168 hours. BNP (last 3 results) No results for input(s): PROBNP in the last 8760  hours. HbA1C: No results for input(s): HGBA1C in the last 72 hours. CBG: Recent Labs  Lab 02/06/20 0613 02/06/20 1109 02/06/20 1602 02/06/20 2129 02/07/20 0634  GLUCAP 154* 145* 123* 125* 139*   Lipid Profile: No results for input(s): CHOL, HDL, LDLCALC, TRIG, CHOLHDL, LDLDIRECT in the last 72 hours. Thyroid Function Tests: No results for input(s): TSH, T4TOTAL, FREET4, T3FREE, THYROIDAB in the last 72 hours. Anemia Panel: No results for input(s): VITAMINB12, FOLATE, FERRITIN, TIBC, IRON, RETICCTPCT in the last 72 hours. Urine analysis:    Component Value Date/Time   COLORURINE YELLOW 11/09/2019 1937   APPEARANCEUR CLEAR 11/09/2019 1937   LABSPEC <1.005 (L) 11/09/2019 1937   PHURINE 5.5 11/09/2019 1937   GLUCOSEU >=500 (A) 11/09/2019 1937   HGBUR NEGATIVE 11/09/2019 1937   BILIRUBINUR NEGATIVE 11/09/2019 1937   KETONESUR NEGATIVE 11/09/2019 1937   PROTEINUR NEGATIVE 11/09/2019 1937   UROBILINOGEN 1.0 06/10/2013 1000   NITRITE NEGATIVE 11/09/2019 1937   LEUKOCYTESUR NEGATIVE 11/09/2019 1937   Sepsis Labs: @LABRCNTIP (procalcitonin:4,lacticidven:4)  ) Recent Results (from the past 240 hour(s))  Resp Panel by RT-PCR (Flu A&B, Covid) Nasopharyngeal Swab     Status: None   Collection Time: 01/30/20 12:38 PM   Specimen: Nasopharyngeal Swab; Nasopharyngeal(NP) swabs in vial transport medium  Result Value Ref Range Status   SARS Coronavirus 2 by RT PCR NEGATIVE NEGATIVE Final    Comment: (NOTE) SARS-CoV-2 target nucleic acids are NOT DETECTED.  The SARS-CoV-2 RNA is generally detectable in upper respiratory specimens during the acute phase of infection. The lowest concentration of SARS-CoV-2 viral copies this assay can detect is 138 copies/mL. A negative result does not preclude SARS-Cov-2 infection and should not be used as the sole basis for treatment or other patient management decisions. A negative result may occur with  improper specimen collection/handling,  submission of specimen other than nasopharyngeal swab, presence of viral mutation(s) within the areas targeted by this assay, and inadequate number of viral copies(<138 copies/mL). A negative result must be combined with clinical observations, patient history, and epidemiological information. The expected result is Negative.  Fact Sheet for Patients:  EntrepreneurPulse.com.au  Fact Sheet for Healthcare Providers:  IncredibleEmployment.be  This test is no t yet approved or cleared by the [place] FDA and  has been authorized for detection and/or diagnosis of SARS-CoV-2 by FDA under an Emergency Use Authorization (EUA). This EUA will remain  in effect (meaning this test  can be used) for the duration of the COVID-19 declaration under Section 564(b)(1) of the Act, 21 U.S.C.section 360bbb-3(b)(1), unless the authorization is terminated  or revoked sooner.       Influenza A by PCR NEGATIVE NEGATIVE Final   Influenza B by PCR NEGATIVE NEGATIVE Final    Comment: (NOTE) The Xpert Xpress SARS-CoV-2/FLU/RSV plus assay is intended as an aid in the diagnosis of influenza from Nasopharyngeal swab specimens and should not be used as a sole basis for treatment. Nasal washings and aspirates are unacceptable for Xpert Xpress SARS-CoV-2/FLU/RSV testing.  Fact Sheet for Patients: EntrepreneurPulse.com.au  Fact Sheet for Healthcare Providers: IncredibleEmployment.be  This test is not yet approved or cleared by the [place] FDA and has been authorized for detection and/or diagnosis of SARS-CoV-2 by FDA under an Emergency Use Authorization (EUA). This EUA will remain in effect (meaning this test can be used) for the duration of the COVID-19 declaration under Section 564(b)(1) of the Act, 21 U.S.C. section 360bbb-3(b)(1), unless the authorization is terminated or revoked.  Performed at [facility], [place] [address], [place], [place] [address]   Culture, blood (routine x 2)     Status: None   Collection Time: 01/30/20  4:40 PM   Specimen: BLOOD RIGHT FOREARM  Result Value Ref Range Status   Specimen Description BLOOD RIGHT FOREARM  Final   Special Requests   Final    BOTTLES DRAWN AEROBIC AND ANAEROBIC Blood Culture results may not be optimal due to an inadequate volume of blood received in culture bottles   Culture   Final    NO GROWTH 5 DAYS Performed at [facility], [place] [address] [place], [place] [address]    Report Status 02/04/2020 FINAL  Final  Culture, blood (routine x 2)     Status: None   Collection Time: 01/30/20  9:54 PM   Specimen: BLOOD  Result Value Ref Range Status   Specimen Description BLOOD LEFT HAND  Final   Special Requests   Final    BOTTLES DRAWN AEROBIC ONLY Blood Culture adequate volume   Culture   Final    NO GROWTH 5 DAYS Performed at [facility], [place] [address], [place], [place] [address]    Report Status 02/04/2020 FINAL  Final  MRSA PCR Screening     Status: None   Collection Time: 01/31/20  7:34 AM   Specimen: Nasal Mucosa; Nasopharyngeal  Result Value Ref Range Status   MRSA by PCR NEGATIVE NEGATIVE Final    Comment:        The GeneXpert MRSA Assay (FDA approved for NASAL specimens only), is one component of a comprehensive MRSA colonization surveillance program. It is not intended to diagnose MRSA infection nor to guide or monitor treatment for MRSA infections. Performed at [facility], [place] [address] [place], [place] [address]   Surgical pcr screen     Status: Abnormal   Collection Time: 02/02/20  6:06 AM   Specimen: Nasal Mucosa; Nasal Swab  Result Value Ref Range Status   MRSA, PCR NEGATIVE NEGATIVE Final   Staphylococcus aureus POSITIVE (A) NEGATIVE Final    Comment: (NOTE) The Xpert SA Assay (FDA approved for NASAL specimens in patients 49 years of age and older), is one component of a  comprehensive surveillance program. It is not intended to diagnose infection nor to guide or monitor treatment. Performed at [facility], [place] [address], [place], [place] [address]  Radiology Studies: DG CHEST PORT 1 VIEW  Result Date: 02/06/2020 CLINICAL DATA:  Shortness of breath EXAM: PORTABLE CHEST 1 VIEW COMPARISON:  Portable exam 1356 hours compared to 01/30/2020 FINDINGS: Normal heart size, mediastinal contours, and pulmonary vascularity. Opacity in medial LEFT lower lobe question atelectasis or infiltrate. Remaining lungs grossly clear. No pleural effusion or pneumothorax. Osseous structures unremarkable. IMPRESSION: Atelectasis versus infiltrate at medial LEFT lower lobe. Electronically Signed   By: Lavonia Dana M.D.   On: 02/06/2020 14:04   Scheduled Meds: . apixaban  5 mg Oral BID  . atorvastatin  40 mg Oral Daily  . Chlorhexidine Gluconate Cloth  6 each Topical Q0600  . digoxin  0.125 mg Oral Daily  . diltiazem  120 mg Oral Once  . feeding supplement (GLUCERNA SHAKE)  237 mL Oral TID BM  . insulin aspart  0-15 Units Subcutaneous TID WC  . insulin aspart  0-5 Units Subcutaneous QHS  . nystatin   Topical BID  . potassium chloride  40 mEq Oral Q4H  . sodium chloride flush  3 mL Intravenous Q12H  . tamsulosin  0.4 mg Oral Daily   Continuous Infusions: . sodium chloride 250 mL (02/02/20 2328)     LOS: 8 days   Time spent: 30 min  Darliss Cheney, MD [facility]  02/07/2020, 11:28 AM

## 2020-02-08 ENCOUNTER — Telehealth: Payer: Self-pay

## 2020-02-08 ENCOUNTER — Inpatient Hospital Stay (HOSPITAL_COMMUNITY): Payer: Medicare PPO

## 2020-02-08 DIAGNOSIS — E876 Hypokalemia: Secondary | ICD-10-CM

## 2020-02-08 DIAGNOSIS — I5032 Chronic diastolic (congestive) heart failure: Secondary | ICD-10-CM

## 2020-02-08 DIAGNOSIS — I959 Hypotension, unspecified: Secondary | ICD-10-CM

## 2020-02-08 DIAGNOSIS — E8809 Other disorders of plasma-protein metabolism, not elsewhere classified: Secondary | ICD-10-CM

## 2020-02-08 DIAGNOSIS — E1169 Type 2 diabetes mellitus with other specified complication: Secondary | ICD-10-CM

## 2020-02-08 DIAGNOSIS — E669 Obesity, unspecified: Secondary | ICD-10-CM

## 2020-02-08 LAB — CBC WITH DIFFERENTIAL/PLATELET
Abs Immature Granulocytes: 0.02 10*3/uL (ref 0.00–0.07)
Basophils Absolute: 0 10*3/uL (ref 0.0–0.1)
Basophils Relative: 0 %
Eosinophils Absolute: 0.1 10*3/uL (ref 0.0–0.5)
Eosinophils Relative: 3 %
HCT: 35.7 % — ABNORMAL LOW (ref 39.0–52.0)
Hemoglobin: 12.8 g/dL — ABNORMAL LOW (ref 13.0–17.0)
Immature Granulocytes: 1 %
Lymphocytes Relative: 25 %
Lymphs Abs: 0.8 10*3/uL (ref 0.7–4.0)
MCH: 31 pg (ref 26.0–34.0)
MCHC: 35.9 g/dL (ref 30.0–36.0)
MCV: 86.4 fL (ref 80.0–100.0)
Monocytes Absolute: 0.1 10*3/uL (ref 0.1–1.0)
Monocytes Relative: 3 %
Neutro Abs: 2.2 10*3/uL (ref 1.7–7.7)
Neutrophils Relative %: 68 %
Platelets: 52 10*3/uL — ABNORMAL LOW (ref 150–400)
RBC: 4.13 MIL/uL — ABNORMAL LOW (ref 4.22–5.81)
RDW: 15.8 % — ABNORMAL HIGH (ref 11.5–15.5)
WBC: 3.3 10*3/uL — ABNORMAL LOW (ref 4.0–10.5)
nRBC: 0 % (ref 0.0–0.2)

## 2020-02-08 LAB — COMPREHENSIVE METABOLIC PANEL
ALT: 11 U/L (ref 0–44)
AST: 10 U/L — ABNORMAL LOW (ref 15–41)
Albumin: 1.8 g/dL — ABNORMAL LOW (ref 3.5–5.0)
Alkaline Phosphatase: 55 U/L (ref 38–126)
Anion gap: 10 (ref 5–15)
BUN: 9 mg/dL (ref 8–23)
CO2: 24 mmol/L (ref 22–32)
Calcium: 7.9 mg/dL — ABNORMAL LOW (ref 8.9–10.3)
Chloride: 101 mmol/L (ref 98–111)
Creatinine, Ser: 0.79 mg/dL (ref 0.61–1.24)
GFR, Estimated: >60 mL/min (ref >60)
Glucose, Bld: 136 mg/dL — ABNORMAL HIGH (ref 70–99)
Potassium: 3.1 mmol/L — ABNORMAL LOW (ref 3.5–5.1)
Sodium: 135 mmol/L (ref 135–145)
Total Bilirubin: 1.4 mg/dL — ABNORMAL HIGH (ref 0.3–1.2)
Total Protein: 3.6 g/dL — ABNORMAL LOW (ref 6.5–8.1)

## 2020-02-08 LAB — GLUCOSE, CAPILLARY
Glucose-Capillary: 147 mg/dL — ABNORMAL HIGH (ref 70–99)
Glucose-Capillary: 166 mg/dL — ABNORMAL HIGH (ref 70–99)
Glucose-Capillary: 147 mg/dL — ABNORMAL HIGH (ref 70–99)
Glucose-Capillary: 169 mg/dL — ABNORMAL HIGH (ref 70–99)

## 2020-02-08 MED ORDER — AMIODARONE HCL IN DEXTROSE 360-4.14 MG/200ML-% IV SOLN
30.0000 mg/h | INTRAVENOUS | Status: DC
  Administered 2020-02-08 – 2020-02-14 (×13): 30 mg/h via INTRAVENOUS
  Filled 2020-02-08 (×13): qty 200

## 2020-02-08 MED ORDER — AMIODARONE LOAD VIA INFUSION
150.0000 mg | Freq: Once | INTRAVENOUS | Status: AC
  Administered 2020-02-08: 150 mg via INTRAVENOUS
  Filled 2020-02-08: qty 83.34

## 2020-02-08 MED ORDER — POTASSIUM CHLORIDE CRYS ER 20 MEQ PO TBCR
40.0000 meq | EXTENDED_RELEASE_TABLET | ORAL | Status: AC
  Administered 2020-02-08: 40 meq via ORAL
  Filled 2020-02-08: qty 2

## 2020-02-08 MED ORDER — AMIODARONE HCL IN DEXTROSE 360-4.14 MG/200ML-% IV SOLN
60.0000 mg/h | INTRAVENOUS | Status: AC
  Administered 2020-02-08: 60 mg/h via INTRAVENOUS
  Filled 2020-02-08: qty 200

## 2020-02-08 NOTE — Telephone Encounter (Signed)
Crystal from [employer] called stating they received an order for a wheel chair but have not received the wheel chair narrative. Crystal states they have sent it over 3 times and have not received anything back so they are having to cancel the order. Crystal said we can fax order and wheel chair narrative to [phone]. Please advise.

## 2020-02-08 NOTE — Progress Notes (Signed)
Physical Therapy Treatment Patient Details Name: Gary Butler MRN: [id number] DOB: 1945/04/14 Today's Date: 02/08/2020    History of Present Illness Gary Butler is a 74/M chronically ill with history significant of OSA; psoriatic arthritis; morbid obesity (BMI 40); HTN; DM; BPH, chronic diastolic CHF (10/2019 echo with preserved EF and grade 2 diastolic dysfunction) presentedwith nausea and anorexia.    -prior admission from 9/27-10/5 with perirectal abscess s/p I&D. he also had new onset afib and was started on Eliquis.Ongoing lingering symptoms for 6 to 8 months, suspect acute on chronic cholecystitis.  Lap cholecystectomy 12/21.    PT Comments    Pt admitted with above diagnosis. Pt was unable to tolerate BPPV treatment today due to considerable dizziness with canalith repositioning causing pt to vomit.  Could not tolerate repositioning therefore had to sit bed back up to chair position. Noted that Meclizine was ordered on 12/23 but never given as it was put in prn and pt never asked for it.  Asked nursing to see if MD can enter it so that pt does not have to ask for it as well as told pt that currently he needs to ask for it when he is dizzy.  Will follow up tomorrow and hope that meds improve symptoms. Pt currently with functional limitations due to the deficits listed below (see PT Problem List). Pt will benefit from skilled PT to increase their independence and safety with mobility to allow discharge to the venue listed below.     Follow Up Recommendations  SNF;Supervision/Assistance - 24 hour     Equipment Recommendations  Wheelchair (measurements PT);Wheelchair cushion (measurements PT)    Recommendations for Other Services       Precautions / Restrictions Precautions Precautions: Fall Precaution Comments: + dizziness Restrictions Weight Bearing Restrictions: No    Mobility  Bed Mobility Overal bed mobility: Needs Assistance Bed Mobility: Rolling;Sidelying to Sit;Sit to  Sidelying Rolling: Mod assist;Min assist Sidelying to sit: +2 for physical assistance;Mod assist     Sit to sidelying: Mod assist General bed mobility comments: mod to roll L, min to roll R, attempts to treat for right anterior canal BPPV unsuccessful due to pt vomiting after the second position.  Pt could not tolerate further treatment. Checked the chart and although MD had ordered Meclizine 12/23 pt never received any because it was prn. Asked nurse if she could message MD to make it TID so pt did not have to ask for it. Pt asked for the Meclizine currently and nursing getting med.  Pt felt a little better once PT got bed in sitting position.  Transfers                 General transfer comment: unable due to vomiting with rolling  Ambulation/Gait             General Gait Details: unable today   Stairs             Wheelchair Mobility    Modified Rankin (Stroke Patients Only)       Balance                                            Cognition Arousal/Alertness: Awake/alert Behavior During Therapy: WFL for tasks assessed/performed Overall Cognitive Status: Impaired/Different from baseline Area of Impairment: Following commands;Problem solving  Following Commands: Follows one step commands with increased time Safety/Judgement: Decreased awareness of safety;Decreased awareness of deficits   Problem Solving: Slow processing;Decreased initiation;Difficulty sequencing;Requires verbal cues General Comments: pt agreeable to further rehab in SNF, agrees he is too weak to return home      Exercises      General Comments General comments (skin integrity, edema, etc.): Pt states he is slightly better after last treatment but still has vertigo. Per testing suspect right anterior canal BPPV therefore attempted to treat but pt could not tolerate as he vomited after second position.      Pertinent Vitals/Pain       Home Living                      Prior Function            PT Goals (current goals can now be found in the care plan section) Acute Rehab PT Goals Patient Stated Goal: to get stronger Progress towards PT goals: Progressing toward goals    Frequency    Min 2X/week      PT Plan Current plan remains appropriate;Frequency needs to be updated    Co-evaluation              AM-PAC PT "6 Clicks" Mobility   Outcome Measure  Help needed turning from your back to your side while in a flat bed without using bedrails?: A Little Help needed moving from lying on your back to sitting on the side of a flat bed without using bedrails?: A Lot Help needed moving to and from a bed to a chair (including a wheelchair)?: Total Help needed standing up from a chair using your arms (e.g., wheelchair or bedside chair)?: Total Help needed to walk in hospital room?: Total Help needed climbing 3-5 steps with a railing? : Total 6 Click Score: 9    End of Session   Activity Tolerance: Other (comment) (limited by dizziness and vomiting) Patient left: with call bell/phone within reach;in bed;with family/visitor present Nurse Communication: Mobility status (Meclizine) PT Visit Diagnosis: Muscle weakness (generalized) (M62.81);Unsteadiness on feet (R26.81);Dizziness and giddiness (R42)     Time: [id number] PT Time Calculation (min) (ACUTE ONLY): 25 min  Charges:  $Therapeutic Activity: 8-22 mins $Canalith Rep Proc: 8-22 mins                     Gary Butler W,PT Acute Rehabilitation Services Pager:  [phone]  Office:  Gary Butler 02/08/2020, 3:19 PM

## 2020-02-08 NOTE — Care Management Important Message (Signed)
Important Message  Patient Details  Name: Gary Butler MRN: [id number] Date of Birth: 1945-03-21   Medicare Important Message Given:  Yes     Renie Ora 02/08/2020, 10:20 AM

## 2020-02-08 NOTE — Progress Notes (Signed)
  Amiodarone Drug - Drug Interaction Consult Note  Recommendations: Monitor for muscle pain or weakness with atorvastatin and increased risk of bleeding with Eliquis. Monitor for digoxin toxicity including GI symptoms, visual disturbances, and cardiac arrhythmias. Consider digoxin level.   Amiodarone is metabolized by the cytochrome P450 system and therefore has the potential to cause many drug interactions. Amiodarone has an average plasma half-life of 50 days (range 20 to 100 days).   There is potential for drug interactions to occur several weeks or months after stopping treatment and the onset of drug interactions may be slow after initiating amiodarone.   [x]  Statins: Increased risk of myopathy. Simvastatin- restrict dose to 20mg  daily. Other statins: counsel patients to report any muscle pain or weakness immediately.  [x]  Anticoagulants: Amiodarone can increase anticoagulant effect. Consider warfarin dose reduction. Patients should be monitored closely and the dose of anticoagulant altered accordingly, remembering that amiodarone levels take several weeks to stabilize.  []  Antiepileptics: Amiodarone can increase plasma concentration of phenytoin, the dose should be reduced. Note that small changes in phenytoin dose can result in large changes in levels. Monitor patient and counsel on signs of toxicity.  []  Beta blockers: increased risk of bradycardia, AV block and myocardial depression. Sotalol - avoid concomitant use.  []   Calcium channel blockers (diltiazem and verapamil): increased risk of bradycardia, AV block and myocardial depression.  []   Cyclosporine: Amiodarone increases levels of cyclosporine. Reduced dose of cyclosporine is recommended.  [x]  Digoxin dose should be halved when amiodarone is started.  []  Diuretics: increased risk of cardiotoxicity if hypokalemia occurs.  []  Oral hypoglycemic agents (glyburide, glipizide, glimepiride): increased risk of hypoglycemia. Patient's  glucose levels should be monitored closely when initiating amiodarone therapy.   []  Drugs that prolong the QT interval:  Torsades de pointes risk may be increased with concurrent use - avoid if possible.  Monitor QTc, also keep magnesium/potassium WNL if concurrent therapy can't be avoided. Antibiotics: e.g. fluoroquinolones, erythromycin. . Antiarrhythmics: e.g. quinidine, procainamide, disopyramide, sotalol. . Antipsychotics: e.g. phenothiazines, haloperidol.  . Lithium, tricyclic antidepressants, and methadone. Thank You,   , PharmD PGY1 Acute Care Pharmacy Resident Phone: [phone] 02/08/2020 9:46 AM  Please check [email or web address] for unit specific pharmacy phone numbers.

## 2020-02-08 NOTE — Telephone Encounter (Signed)
Paperwork refaxed with provider notes attached.

## 2020-02-08 NOTE — TOC Progression Note (Signed)
Transition of Care [facility]) - Progression Note    Patient Details  Name: Gary Butler MRN: [id number] Date of Birth: Oct 10, 1945  Transition of Care [facility]) CM/SW [place], [place] Phone Number: 02/08/2020, 3:37 PM  Clinical Narrative:     CSW met with pt and pt wife bedside. Confirmed bed choice for [facility]. [facility]; will need to confirm with them. CSW informed pt and pt wife of SNF copay days. Wife is aware with copay policies. TOC will continue to follow.   Expected Discharge Plan: [place] Barriers to Discharge: [place] Work up,SNF Pending bed offer (maybe copays status)  Expected Discharge Plan and Services Expected Discharge Plan: [place] In-house Referral: Clinical Social Work     Living arrangements for the past 2 months: Single Family Home                                       Social Determinants of Health (SDOH) Interventions    Readmission Risk Interventions No flowsheet data found.

## 2020-02-08 NOTE — Progress Notes (Signed)
PROGRESS NOTE    Gary Butler  [id number] DOB: 15-Dec-1945 DOA: 01/30/2020 PCP: [name], MD  Brief Narrative:HPI: Gary Butler is a 74/M chronically ill with history significant of OSA; psoriatic arthritis; morbid obesity (BMI 40); HTN; DM; BPH, chronic diastolic CHF (0000000 echo with preserved EF and grade 2 diastolic dysfunction) presentedwith nausea and anorexia.   -prior admission from 9/27-10/5 with perirectal abscess s/p I&D. he also had new onset afib and was started on Eliquis. --Last admitted from 10/9-13 with hematochezia; this was felt to be related to recent perineal surgery site bleeding and Eliquis was resumed at hospital d/c.  He was discharged to [facility] for rehab and was discharged to home in late November.   He has recently been referred to palliative care as an outpatient.  - He has had 3 recent ER visits to [facility] hospital most recently on 12/10.  He reported 8 months of anorexia, nausea and post-meal emesis.   -Progressively worsened, has been on ice chips for several weeks, lost about 100 pounds of weight. in the ED noted to have A. fib RVR, severe hypokalemia -CT abdomen pelvis concerning for emphysematous/gangrenous cholecystitis -Seen by general surgery and cleared for surgery by cardiology.  HIDA scan was also abnormal.  Underwent laparoscopic cholecystectomy on 02/02/2020 by Dr. Grandville Silos.  Was on IV antibiotics before and few days after surgery.  Preoperatively and perioperatively, he was managed with IV Cardizem for rate control.  He was then switched to oral Cardizem postoperatively and was doing fine.  He was evaluated by PT OT and they recommended discharging to SNF.  Initially he was reluctant but after counseling, he did agree to that.  He was waiting in the hospital pending placement when he developed problem with atrial fibrillation and RVR as well as hypotension.  Cardiology was reconsulted and they started him on digoxin and oral  diltiazem.  This dropped his blood pressure further.  Assessment & Plan:   Sepsis Gangrenous/emphysematous cholecystitis -General surgery following, and seen by cardiology for clearance -Ongoing lingering symptoms for 6 to 8 months, suspect acute on chronic cholecystitis HIDA scan abnormal. Status post laparoscopic cholecystectomy on 02/02/2020 by Dr. Grandville Silos. Continue IV antibiotics for another day per general surgery.  Tolerating regular diet.  General surgery signed off.  Recent perirectal abscess  -With perirectal thickening on CT -Antibiotics as above, I wonder if this is unexpected in the setting of recent abscess and surgery  A. fib with RVR Chronic diastolic CHF Due to persistent A. fib with RVR early morning of 02/06/2020, cardiology was reconsulted.  Patient was already on Cardizem but having high heart rate.  He was started on digoxin and Cardizem was switched to short acting.  Rapid response was called on the evening of 02/07/2020 due to low blood pressure and some lethargy.  Patient was given some IV fluid bolus however this morning his blood pressure still is tenuous.  He still has A. fib with RVR with rates around 130.  He denies any chest pain or shortness of breath.  Cardiology has now started him on amiodarone along with continuation of digoxin.  I hope this will help bring his blood pressure up and control his heart rate.  His hypotension is likely secondary to A. fib with RVR instead of any sort of pneumonia as he does not seem to have pneumonia symptoms and chest x-ray repeated today is much better and does not show infection.  Type 2 diabetes mellitus -Jardiance on hold,  continue sliding scale insulin.  Blood sugar controlled.  Mild dementia/cognitive deficits: Noted however patient seems to be completely alert and oriented.  Abnormal chest x-ray suspecting possible infiltrates: Repeat chest x-ray today does not show infiltrates.  He does not have pneumonia.  Morbid  obesity Obstructive sleep apnea -Has lost 100 pounds and needs continued weight loss -Has not been using BiPAP/CPAP following weight loss  Goals of care DO NOT RESUSCITATE -Followed by palliative care, not under hospice  Deconditioning: Seen by PT OT and they recommend SNF.  Patient initially reluctant for that but after further discussion and counseling, he is agreeable to do that.  TOC working on finding placement.  Vertigo: Continue meclizine as recommended by PT.  His symptoms are likely secondary to BPPV.  PT is working with him.  His symptoms are improving.  Fluid/volume overload: Patient has +2 pitting edema bilateral lower extremity which is likely due to hypoalbuminemia.  Ideally he would benefit from diuretics however his low blood pressure is not in favor for diuretics.  Possible peripheral neuropathy: Patient is very tender to touch on both lower extremities.  Likely undiagnosed peripheral neuropathy in a patient who already has diabetes.  Will need to be started on gabapentin at the time of discharge.  Hypomagnesemia/hypokalemia: Low again.  Will replace potassium.  Recheck in the morning.  Check magnesium and potassium tomorrow.  DVT prophylaxis: Eliquis Code Status: DNR Family Communication: No family at bedside  Disposition Plan:  Status is: Inpatient  Remains inpatient appropriate because:Inpatient level of care appropriate due to severity of illness   Dispo: The patient is from: Home              Anticipated d/c is to: May need rehab              Anticipated d/c date is: 1 to 2 days              Patient currently is not medically stable to d/c.  Consultants:   General surgery  Cardiology   Procedures:   Antimicrobials:    Subjective: Patient seen and examined.  Complains of feeling weak however he is looking much brighter than yesterday.  He had a good sleep last night.  Denies any chest pain or shortness of breath.  Objective: Vitals:   02/07/20  2300 02/08/20 0330 02/08/20 0758 02/08/20 1128  BP: (!) 94/59 (!) 90/58 98/69 (!) 88/63  Pulse: 99 (!) 109 (!) 133 (!) 115  Resp: 19 19 17 18   Temp: 97.7 F (36.5 C) 98 F (36.7 C) (!) 97.5 F (36.4 C) 97.6 F (36.4 C)  TempSrc: Oral Oral Oral Oral  SpO2: 96% 97% 97% 95%  Weight:  125.9 kg    Height:        Intake/Output Summary (Last 24 hours) at 02/08/2020 1347 Last data filed at 02/08/2020 0931 Gross per 24 hour  Intake 240 ml  Output --  Net 240 ml   Filed Weights   02/06/20 0455 02/07/20 0655 02/08/20 0330  Weight: 126.6 kg 128.5 kg 125.9 kg    Examination:  General exam:   General exam: Appears calm and comfortable  Respiratory system: Clear to auscultation. Respiratory effort normal. Cardiovascular system: S1 & S2 heard, irregularly irregular rate and rhythm. No JVD, murmurs, rubs, gallops or clicks.  +2 pitting edema bilateral lower extremity Gastrointestinal system: Abdomen is nondistended, soft and nontender. No organomegaly or masses felt. Normal bowel sounds heard. Central nervous system: Alert and oriented. No focal neurological  deficits. Extremities: Symmetric 5 x 5 power. Skin: No rashes, lesions or ulcers.  Psychiatry: Judgement and insight appear normal. Mood & affect appropriate.   Data Reviewed:   CBC: Recent Labs  Lab 02/05/20 0224 02/06/20 0130 02/06/20 1407 02/07/20 0639 02/08/20 0043  WBC 3.8* 3.9* 3.9* 3.9* 3.3*  NEUTROABS  --   --  2.9 2.5 2.2  HGB 14.3 14.2 13.4 14.0 12.8*  HCT 38.6* 38.7* 39.0 40.6 35.7*  MCV 85.0 85.8 88.0 88.6 86.4  PLT 58* 76* 71* 61* 52*   Basic Metabolic Panel: Recent Labs  Lab 02/04/20 0130 02/05/20 1007 02/06/20 1407 02/07/20 0639 02/08/20 0043  NA 138 136 136 133* 135  K 3.1* 4.9 3.0* 3.2* 3.1*  CL 105 108 105 102 101  CO2 19* 14* 21* 19* 24  GLUCOSE 139* 128* 128* 138* 136*  BUN 8 11 10 9 9   CREATININE 0.72 0.77 0.80 0.78 0.79  CALCIUM 8.3* 8.0* 8.2* 8.1* 7.9*  MG 1.6* 2.0  --  1.7  --     GFR: Estimated Creatinine Clearance: 107.8 mL/min (by C-G formula based on SCr of 0.79 mg/dL). Liver Function Tests: Recent Labs  Lab 02/02/20 0123 02/03/20 0415 02/04/20 0130 02/08/20 0043  AST 9* 30 21 10*  ALT 8 16 15 11   ALKPHOS 60 56 49 55  BILITOT 1.5* 1.5* 1.0 1.4*  PROT 4.1* 4.3* 3.8* 3.6*  ALBUMIN 2.1* 2.2* 1.9* 1.8*   No results for input(s): LIPASE, AMYLASE in the last 168 hours. No results for input(s): AMMONIA in the last 168 hours. Coagulation Profile: No results for input(s): INR, PROTIME in the last 168 hours. Cardiac Enzymes: No results for input(s): CKTOTAL, CKMB, CKMBINDEX, TROPONINI in the last 168 hours. BNP (last 3 results) No results for input(s): PROBNP in the last 8760 hours. HbA1C: No results for input(s): HGBA1C in the last 72 hours. CBG: Recent Labs  Lab 02/07/20 1243 02/07/20 1803 02/07/20 2204 02/08/20 0629 02/08/20 1129  GLUCAP 144* 132* 129* 147* 166*   Lipid Profile: No results for input(s): CHOL, HDL, LDLCALC, TRIG, CHOLHDL, LDLDIRECT in the last 72 hours. Thyroid Function Tests: No results for input(s): TSH, T4TOTAL, FREET4, T3FREE, THYROIDAB in the last 72 hours. Anemia Panel: No results for input(s): VITAMINB12, FOLATE, FERRITIN, TIBC, IRON, RETICCTPCT in the last 72 hours. Urine analysis:    Component Value Date/Time   COLORURINE YELLOW 02/06/2020 1309   APPEARANCEUR CLEAR 02/06/2020 1309   LABSPEC 1.010 02/06/2020 1309   PHURINE 5.0 02/06/2020 1309   GLUCOSEU 50 (A) 02/06/2020 1309   HGBUR SMALL (A) 02/06/2020 1309   BILIRUBINUR NEGATIVE 02/06/2020 1309   KETONESUR 5 (A) 02/06/2020 1309   PROTEINUR NEGATIVE 02/06/2020 1309   UROBILINOGEN 1.0 06/10/2013 1000   NITRITE NEGATIVE 02/06/2020 1309   LEUKOCYTESUR NEGATIVE 02/06/2020 1309   Sepsis Labs: @LABRCNTIP (procalcitonin:4,lacticidven:4)  ) Recent Results (from the past 240 hour(s))  Resp Panel by RT-PCR (Flu A&B, Covid) Nasopharyngeal Swab     Status: None    Collection Time: 01/30/20 12:38 PM   Specimen: Nasopharyngeal Swab; Nasopharyngeal(NP) swabs in vial transport medium  Result Value Ref Range Status   SARS Coronavirus 2 by RT PCR NEGATIVE NEGATIVE Final    Comment: (NOTE) SARS-CoV-2 target nucleic acids are NOT DETECTED.  The SARS-CoV-2 RNA is generally detectable in upper respiratory specimens during the acute phase of infection. The lowest concentration of SARS-CoV-2 viral copies this assay can detect is 138 copies/mL. A negative result does not preclude SARS-Cov-2 infection and should not  be used as the sole basis for treatment or other patient management decisions. A negative result may occur with  improper specimen collection/handling, submission of specimen other than nasopharyngeal swab, presence of viral mutation(s) within the areas targeted by this assay, and inadequate number of viral copies(<138 copies/mL). A negative result must be combined with clinical observations, patient history, and epidemiological information. The expected result is Negative.  Fact Sheet for Patients:  EntrepreneurPulse.com.au  Fact Sheet for Healthcare Providers:  IncredibleEmployment.be  This test is no t yet approved or cleared by the [place] FDA and  has been authorized for detection and/or diagnosis of SARS-CoV-2 by FDA under an Emergency Use Authorization (EUA). This EUA will remain  in effect (meaning this test can be used) for the duration of the COVID-19 declaration under Section 564(b)(1) of the Act, 21 U.S.C.section 360bbb-3(b)(1), unless the authorization is terminated  or revoked sooner.       Influenza A by PCR NEGATIVE NEGATIVE Final   Influenza B by PCR NEGATIVE NEGATIVE Final    Comment: (NOTE) The Xpert Xpress SARS-CoV-2/FLU/RSV plus assay is intended as an aid in the diagnosis of influenza from Nasopharyngeal swab specimens and should not be used as a sole basis for treatment.  Nasal washings and aspirates are unacceptable for Xpert Xpress SARS-CoV-2/FLU/RSV testing.  Fact Sheet for Patients: EntrepreneurPulse.com.au  Fact Sheet for Healthcare Providers: IncredibleEmployment.be  This test is not yet approved or cleared by the [place] FDA and has been authorized for detection and/or diagnosis of SARS-CoV-2 by FDA under an Emergency Use Authorization (EUA). This EUA will remain in effect (meaning this test can be used) for the duration of the COVID-19 declaration under Section 564(b)(1) of the Act, 21 U.S.C. section 360bbb-3(b)(1), unless the authorization is terminated or revoked.  Performed at [facility], [place] [address] [place], [place] [address]   Culture, blood (routine x 2)     Status: None   Collection Time: 01/30/20  4:40 PM   Specimen: BLOOD RIGHT FOREARM  Result Value Ref Range Status   Specimen Description BLOOD RIGHT FOREARM  Final   Special Requests   Final    BOTTLES DRAWN AEROBIC AND ANAEROBIC Blood Culture results may not be optimal due to an inadequate volume of blood received in culture bottles   Culture   Final    NO GROWTH 5 DAYS Performed at [facility], [place] [address] [place], [place] [address]    Report Status 02/04/2020 FINAL  Final  Culture, blood (routine x 2)     Status: None   Collection Time: 01/30/20  9:54 PM   Specimen: BLOOD  Result Value Ref Range Status   Specimen Description BLOOD LEFT HAND  Final   Special Requests   Final    BOTTLES DRAWN AEROBIC ONLY Blood Culture adequate volume   Culture   Final    NO GROWTH 5 DAYS Performed at [facility], [place] [address], [place], [place] [address]    Report Status 02/04/2020 FINAL  Final  MRSA PCR Screening     Status: None   Collection Time: 01/31/20  7:34 AM   Specimen: Nasal Mucosa; Nasopharyngeal  Result Value Ref Range Status   MRSA by PCR NEGATIVE NEGATIVE Final    Comment:        The GeneXpert  MRSA Assay (FDA approved for NASAL specimens only), is one component of a comprehensive MRSA colonization surveillance program. It is not intended to diagnose MRSA infection nor  to guide or monitor treatment for MRSA infections. Performed at [facility], [place] [address] [place], [place] [address]   Surgical pcr screen     Status: Abnormal   Collection Time: 02/02/20  6:06 AM   Specimen: Nasal Mucosa; Nasal Swab  Result Value Ref Range Status   MRSA, PCR NEGATIVE NEGATIVE Final   Staphylococcus aureus POSITIVE (A) NEGATIVE Final    Comment: (NOTE) The Xpert SA Assay (FDA approved for NASAL specimens in patients 40 years of age and older), is one component of a comprehensive surveillance program. It is not intended to diagnose infection nor to guide or monitor treatment. Performed at [facility], [place] [address], [place], [place] [address]   Culture, blood (Routine X 2) w Reflex to ID Panel     Status: None (Preliminary result)   Collection Time: 02/06/20  2:07 PM   Specimen: BLOOD  Result Value Ref Range Status   Specimen Description BLOOD RIGHT ANTECUBITAL  Final   Special Requests AEROBIC BOTTLE ONLY Blood Culture adequate volume  Final   Culture   Final    NO GROWTH 2 DAYS Performed at [facility], [place] [address], [place], [place] [address]    Report Status PENDING  Incomplete  Culture, blood (Routine X 2) w Reflex to ID Panel     Status: None (Preliminary result)   Collection Time: 02/06/20  2:22 PM   Specimen: BLOOD  Result Value Ref Range Status   Specimen Description BLOOD BLOOD RIGHT HAND  Final   Special Requests   Final    BOTTLES DRAWN AEROBIC AND ANAEROBIC Blood Culture adequate volume   Culture   Final    NO GROWTH 2 DAYS Performed at [facility], [place] [address] [place], [place] [address]    Report Status PENDING  Incomplete    Radiology Studies: DG CHEST PORT 1 VIEW  Result Date: 02/08/2020 CLINICAL DATA:  Hypoxia EXAM:  PORTABLE CHEST 1 VIEW COMPARISON:  02/06/2020 FINDINGS: Heart is normal size. No confluent airspace opacity or effusion. No acute bony abnormality. IMPRESSION: No active disease. Electronically Signed   By: Rolm Baptise M.D.   On: 02/08/2020 09:50   DG CHEST PORT 1 VIEW  Result Date: 02/06/2020 CLINICAL DATA:  Shortness of breath EXAM: PORTABLE CHEST 1 VIEW COMPARISON:  Portable exam 1356 hours compared to 01/30/2020 FINDINGS: Normal heart size, mediastinal contours, and pulmonary vascularity. Opacity in medial LEFT lower lobe question atelectasis or infiltrate. Remaining lungs grossly clear. No pleural effusion or pneumothorax. Osseous structures unremarkable. IMPRESSION: Atelectasis versus infiltrate at medial LEFT lower lobe. Electronically Signed   By: Lavonia Dana M.D.   On: 02/06/2020 14:04   ECHOCARDIOGRAM LIMITED  Result Date: 02/07/2020    ECHOCARDIOGRAM LIMITED REPORT   Patient Name:   TALLIE MARITATO  Date of Exam: 02/07/2020 Medical Rec #:  [id number]  Height:       71.0 in Accession #:    [id number] Weight:       283.3 lb Date of Birth:  Jun 08, 1945  BSA:          2.445 m Patient Age:    10 years   BP:           92/66 mmHg Patient Gender: M          HR:           98 bpm. Exam Location:  Inpatient Procedure: Color Doppler, Limited Echo and Intracardiac Opacification Agent Indications:  CHF-Acute Diastolic  History:        Patient has prior history of Echocardiogram examinations, most                 recent 10/22/2019. CHF, Arrythmias:Atrial Fibrillation; Risk                 Factors:Sleep Apnea, Diabetes and Hypertension.  Sonographer:    Clayton Lefort RDCS (AE) Referring Phys: [id number] [place]  Sonographer Comments: Technically challenging study due to limited acoustic windows, Technically difficult study due to poor echo windows, no parasternal window, suboptimal apical window, no subcostal window and patient is morbidly obese. Image acquisition challenging due to patient body habitus and  limited patient mobility. Extremely poor windows limit ability to obtain diagnostic images. IMPRESSIONS  1. Very limited study quality, LVEF determined by Definity echocontrast and appears normal 55-60%, right ventricle is not visualized. FINDINGS  Left Ventricle: Definity contrast agent was given IV to delineate the left ventricular endocardial borders. Ena Dawley MD Electronically signed by Ena Dawley MD Signature Date/Time: 02/07/2020/3:41:27 PM    Final    Scheduled Meds: . apixaban  5 mg Oral BID  . atorvastatin  40 mg Oral Daily  . digoxin  0.125 mg Oral Daily  . feeding supplement (GLUCERNA SHAKE)  237 mL Oral TID BM  . insulin aspart  0-15 Units Subcutaneous TID WC  . insulin aspart  0-5 Units Subcutaneous QHS  . nystatin   Topical BID  . potassium chloride  40 mEq Oral Q4H  . sodium chloride flush  3 mL Intravenous Q12H  . tamsulosin  0.4 mg Oral Daily   Continuous Infusions: . sodium chloride 250 mL (02/02/20 2328)  . amiodarone 60 mg/hr (02/08/20 1046)   Followed by  . amiodarone       LOS: 9 days   Time spent: 30 min  Darliss Cheney, MD [facility]  02/08/2020, 1:47 PM

## 2020-02-08 NOTE — Progress Notes (Signed)
Progress Note  Patient Name: Gary Butler Date of Encounter: 02/08/2020  [facility] Cardiologist: Shelva Majestic, MD  Subjective   Rapid response called yesterday for hypotension. Fluid bolus given, but remains hypotensive and tachycardic. Dr. Doristine Bosworth at bedside during interview, discussed plans for amiodarone. Slept well, no chest pain. Breathing is somewhat short, coughing up brown sputum.  Inpatient Medications    Scheduled Meds: . apixaban  5 mg Oral BID  . atorvastatin  40 mg Oral Daily  . digoxin  0.125 mg Oral Daily  . feeding supplement (GLUCERNA SHAKE)  237 mL Oral TID BM  . insulin aspart  0-15 Units Subcutaneous TID WC  . insulin aspart  0-5 Units Subcutaneous QHS  . nystatin   Topical BID  . sodium chloride flush  3 mL Intravenous Q12H  . tamsulosin  0.4 mg Oral Daily   Continuous Infusions: . sodium chloride 250 mL (02/02/20 2328)   PRN Meds: sodium chloride, acetaminophen **OR** acetaminophen, meclizine, morphine injection, ondansetron **OR** ondansetron (ZOFRAN) IV, oxyCODONE, promethazine, tiZANidine   Vital Signs    Vitals:   02/07/20 2000 02/07/20 2300 02/08/20 0330 02/08/20 0758  BP: (!) 108/53 (!) 94/59 (!) 90/58 98/69  Pulse: (!) 101 99 (!) 109 (!) 133  Resp: 18 19 19 17   Temp: 97.9 F (36.6 C) 97.7 F (36.5 C) 98 F (36.7 C) (!) 97.5 F (36.4 C)  TempSrc: Oral Oral Oral Oral  SpO2: 96% 96% 97% 97%  Weight:   125.9 kg   Height:        Intake/Output Summary (Last 24 hours) at 02/08/2020 0910 Last data filed at 02/07/2020 1246 Gross per 24 hour  Intake --  Output 377 ml  Net -377 ml   Last 3 Weights 02/08/2020 02/07/2020 02/06/2020  Weight (lbs) 277 lb 9 oz 283 lb 4.7 oz 279 lb 1.6 oz  Weight (kg) 125.9 kg 128.5 kg 126.6 kg      Telemetry    Atrial fibrillation, rates 100s-130s - Personally Reviewed  ECG    No new since 12/25 - Personally Reviewed  Physical Exam   GEN: No acute distress.   Neck: No JVD visible at 45  degrees Cardiac: irregularly irregular, no murmurs, rubs, or gallops.  Respiratory: Distant bilaterally but no rales appreciated. GI: Soft, nontender, non-distended  MS: Bilateral 3+ LE edema; No deformity. Neuro:  Nonfocal  Psych: Normal affect   Labs    High Sensitivity Troponin:   Recent Labs  Lab 01/30/20 1246 01/30/20 1640  TROPONINIHS 28* 29*      Chemistry Recent Labs  Lab 02/03/20 0415 02/04/20 0130 02/05/20 1007 02/06/20 1407 02/07/20 0639 02/08/20 0043  NA 137 138   < > 136 133* 135  K 3.7 3.1*   < > 3.0* 3.2* 3.1*  CL 106 105   < > 105 102 101  CO2 19* 19*   < > 21* 19* 24  GLUCOSE 177* 139*   < > 128* 138* 136*  BUN 10 8   < > 10 9 9   CREATININE 0.89 0.72   < > 0.80 0.78 0.79  CALCIUM 8.5* 8.3*   < > 8.2* 8.1* 7.9*  PROT 4.3* 3.8*  --   --   --  3.6*  ALBUMIN 2.2* 1.9*  --   --   --  1.8*  AST 30 21  --   --   --  10*  ALT 16 15  --   --   --  11  ALKPHOS 56 49  --   --   --  55  BILITOT 1.5* 1.0  --   --   --  1.4*  GFRNONAA >60 >60   < > >60 >60 >60  ANIONGAP 12 14   < > 10 12 10    < > = values in this interval not displayed.     Hematology Recent Labs  Lab 02/06/20 1407 02/07/20 0639 02/08/20 0043  WBC 3.9* 3.9* 3.3*  RBC 4.43 4.58 4.13*  HGB 13.4 14.0 12.8*  HCT 39.0 40.6 35.7*  MCV 88.0 88.6 86.4  MCH 30.2 30.6 31.0  MCHC 34.4 34.5 35.9  RDW 16.1* 15.9* 15.8*  PLT 71* 61* 52*    BNP Recent Labs  Lab 02/06/20 1407  BNP 218.4*     DDimer No results for input(s): DDIMER in the last 168 hours.   Radiology    DG CHEST PORT 1 VIEW  Result Date: 02/06/2020 CLINICAL DATA:  Shortness of breath EXAM: PORTABLE CHEST 1 VIEW COMPARISON:  Portable exam 1356 hours compared to 01/30/2020 FINDINGS: Normal heart size, mediastinal contours, and pulmonary vascularity. Opacity in medial LEFT lower lobe question atelectasis or infiltrate. Remaining lungs grossly clear. No pleural effusion or pneumothorax. Osseous structures unremarkable.  IMPRESSION: Atelectasis versus infiltrate at medial LEFT lower lobe. Electronically Signed   By: 02/01/2020 M.D.   On: 02/06/2020 14:04   ECHOCARDIOGRAM LIMITED  Result Date: 02/07/2020    ECHOCARDIOGRAM LIMITED REPORT   Patient Name:   LEMONTE AL  Date of Exam: 02/07/2020 Medical Rec #:  02/09/2020  Height:       71.0 in Accession #:    [id number] Weight:       283.3 lb Date of Birth:  Nov 23, 1945  BSA:          2.445 m Patient Age:    74 years   BP:           92/66 mmHg Patient Gender: M          HR:           98 bpm. Exam Location:  Inpatient Procedure: Color Doppler, Limited Echo and Intracardiac Opacification Agent Indications:    CHF-Acute Diastolic  History:        Patient has prior history of Echocardiogram examinations, most                 recent 10/22/2019. CHF, Arrythmias:Atrial Fibrillation; Risk                 Factors:Sleep Apnea, Diabetes and Hypertension.  Sonographer:    12/22/2019 RDCS (AE) Referring Phys: Ross Ludwig [facility] O'NEAL  Sonographer Comments: Technically challenging study due to limited acoustic windows, Technically difficult study due to poor echo windows, no parasternal window, suboptimal apical window, no subcostal window and patient is morbidly obese. Image acquisition challenging due to patient body habitus and limited patient mobility. Extremely poor windows limit ability to obtain diagnostic images. IMPRESSIONS  1. Very limited study quality, LVEF determined by Definity echocontrast and appears normal 55-60%, right ventricle is not visualized. FINDINGS  Left Ventricle: Definity contrast agent was given IV to delineate the left ventricular endocardial borders. [facility] MD Electronically signed by Tobias Alexander MD Signature Date/Time: 02/07/2020/3:41:27 PM    Final     Cardiac Studies   Limited echo 02/07/20 1. Very limited study quality, LVEF determined by Definity echocontrast  and appears normal 55-60%, right ventricle is not visualized.  TTE  11/11/2019 1. Left  ventricular ejection fraction, by estimation, is 60 to 65%. The  left ventricle has normal function. The left ventricle has no regional  wall motion abnormalities. Left ventricular diastolic parameters are  consistent with Grade II diastolic  dysfunction (pseudonormalization).  2. Right ventricular systolic function is normal. The right ventricular  size is normal.  3. The mitral valve is normal in structure. No evidence of mitral valve  regurgitation. No evidence of mitral stenosis.  4. The aortic valve is normal in structure. Aortic valve regurgitation is  not visualized. No aortic stenosis is present.  5. The inferior vena cava is normal in size with greater than 50%  respiratory variability, suggesting right atrial pressure of 3 mmHg.   Patient Profile     74 y.o. male with diabetes, hypertension, A. fib, HFpEF, obesity who was admitted on 01/30/2020 for nausea/vomiting and found to have gangrenous cholecystitis.  Cardiology initially consulted for preoperative assessment.  Cardiology was reconsulted on 02/06/2020 for A. fib with RVR and low blood pressure.  Assessment & Plan    A. fib with RVR Hypotension -Digoxin loaded this admission.  Continue 0.125 mg daily.  -was on bisoprolol as an outpatient. Started on diltiazem 02/06/20 for RVR. -received diltiazem ER 120 mg yesterday. Held due to hypotension. -will start IV amiodarone more for rate than rhythm control. My suspicion is that hypotension is due to RVR. Once rate better controlled, if BP control will start back oral agents. -Continue Eliquis 5 mg twice daily -He was diagnosed with A. fib in October in the setting of perirectal abscess.  Given poor mobility, rate control strategy was recommended. Will aim for this, but IV amiodarone in the short term given hypotension  Volume overloaded Elevated BNP/low albumin Acute on chronic diastolic heart failure -EF preserved on limited echo -lasix held,  received IV fluids yesterday for hypotension. Limited ability for diuresis given pressures -albumin 1.8, likely contributing to third spacing/edema -no JVD on exam, no rales appreciated  Hypokalemia: -K 3.1 today, aim for >4 -will replete with 40 mEq x2 today   Type II diabetes -given risk of volume overload, would recommend alternative to pioglitazone as an outpatient -continue Jardiance as an outpatient -insulin while inpatient per primary team  For questions or updates, please contact [facility] Please consult [email or web address] for contact info under     Signed, Buford Dresser, MD  02/08/2020, 9:10 AM

## 2020-02-08 NOTE — Telephone Encounter (Signed)
Paperwork for [employer] did not go through. Patient is currently hospitalized. Per PCP, will hold off on sending paperwork to [employer] for now.

## 2020-02-09 ENCOUNTER — Inpatient Hospital Stay (HOSPITAL_COMMUNITY): Payer: Medicare PPO

## 2020-02-09 DIAGNOSIS — I639 Cerebral infarction, unspecified: Secondary | ICD-10-CM

## 2020-02-09 LAB — CBC WITH DIFFERENTIAL/PLATELET
Abs Immature Granulocytes: 0.03 10*3/uL (ref 0.00–0.07)
Basophils Absolute: 0 10*3/uL (ref 0.0–0.1)
Basophils Relative: 0 %
Eosinophils Absolute: 0.1 10*3/uL (ref 0.0–0.5)
Eosinophils Relative: 1 %
HCT: 39.3 % (ref 39.0–52.0)
Hemoglobin: 13.8 g/dL (ref 13.0–17.0)
Immature Granulocytes: 1 %
Lymphocytes Relative: 22 %
Lymphs Abs: 0.9 10*3/uL (ref 0.7–4.0)
MCH: 30.1 pg (ref 26.0–34.0)
MCHC: 35.1 g/dL (ref 30.0–36.0)
MCV: 85.6 fL (ref 80.0–100.0)
Monocytes Absolute: 0.1 10*3/uL (ref 0.1–1.0)
Monocytes Relative: 3 %
Neutro Abs: 2.8 10*3/uL (ref 1.7–7.7)
Neutrophils Relative %: 73 %
Platelets: 45 10*3/uL — ABNORMAL LOW (ref 150–400)
RBC: 4.59 MIL/uL (ref 4.22–5.81)
RDW: 15.9 % — ABNORMAL HIGH (ref 11.5–15.5)
WBC: 3.9 10*3/uL — ABNORMAL LOW (ref 4.0–10.5)
nRBC: 0 % (ref 0.0–0.2)

## 2020-02-09 LAB — GLUCOSE, CAPILLARY
Glucose-Capillary: 184 mg/dL — ABNORMAL HIGH (ref 70–99)
Glucose-Capillary: 174 mg/dL — ABNORMAL HIGH (ref 70–99)
Glucose-Capillary: 166 mg/dL — ABNORMAL HIGH (ref 70–99)
Glucose-Capillary: 138 mg/dL — ABNORMAL HIGH (ref 70–99)

## 2020-02-09 LAB — MAGNESIUM: Magnesium: 1.7 mg/dL (ref 1.7–2.4)

## 2020-02-09 LAB — BASIC METABOLIC PANEL
Anion gap: 14 (ref 5–15)
BUN: 14 mg/dL (ref 8–23)
CO2: 18 mmol/L — ABNORMAL LOW (ref 22–32)
Calcium: 8.3 mg/dL — ABNORMAL LOW (ref 8.9–10.3)
Chloride: 101 mmol/L (ref 98–111)
Creatinine, Ser: 0.89 mg/dL (ref 0.61–1.24)
GFR, Estimated: >60 mL/min (ref >60)
Glucose, Bld: 162 mg/dL — ABNORMAL HIGH (ref 70–99)
Potassium: 3.8 mmol/L (ref 3.5–5.1)
Sodium: 133 mmol/L — ABNORMAL LOW (ref 135–145)

## 2020-02-09 MED ORDER — STROKE: EARLY STAGES OF RECOVERY BOOK
Freq: Once | Status: AC
  Filled 2020-02-09: qty 1

## 2020-02-09 MED ORDER — POTASSIUM CHLORIDE CRYS ER 20 MEQ PO TBCR
40.0000 meq | EXTENDED_RELEASE_TABLET | Freq: Once | ORAL | Status: AC
  Administered 2020-02-09: 40 meq via ORAL
  Filled 2020-02-09: qty 2

## 2020-02-09 MED ORDER — METOPROLOL TARTRATE 12.5 MG HALF TABLET: 12.5000 mg | ORAL_TABLET | Freq: Two times a day (BID) | ORAL | Status: DC

## 2020-02-09 MED ORDER — MAGNESIUM SULFATE 2 GM/50ML IV SOLN
2.0000 g | Freq: Once | INTRAVENOUS | Status: AC
  Administered 2020-02-09: 2 g via INTRAVENOUS
  Filled 2020-02-09: qty 50

## 2020-02-09 NOTE — Progress Notes (Signed)
MD on call with [facility] hospitalist. Made aware through [facility] of patient swallowing PT currently working with patient. will monitor. Merrill Villarruel, Randall An RN

## 2020-02-09 NOTE — Progress Notes (Signed)
Occupational Therapy Treatment Patient Details Name: Gary Butler MRN: [id number] DOB: 09/06/1945 Today's Date: 02/09/2020    History of present illness Gary Butler is a 74/M chronically ill with history significant of OSA; psoriatic arthritis; morbid obesity (BMI 40); HTN; DM; BPH, chronic diastolic CHF (10/2019 echo with preserved EF and grade 2 diastolic dysfunction) presentedwith nausea and anorexia.    -prior admission from 9/27-10/5 with perirectal abscess s/p I&D. he also had new onset afib and was started on Eliquis.Ongoing lingering symptoms for 6 to 8 months, suspect acute on chronic cholecystitis.  Lap cholecystectomy 12/21.   OT comments  Pt making gradual progress towards OT goals this session however pt needing more physical  assist this session to mobilize to EOB. Pt required total A +2 for all mobility tasks with pt only able tolerate sitting EOB ~ 5 mins before attempting to return self to supine d/t reports of dizziness. Alerted RN of decline in function, will have OTR reassess next session pending MRI results. Pt would continue to benefit from skilled occupational therapy while admitted and after d/c to address the below listed limitations in order to improve overall functional mobility and facilitate independence with BADL participation. DC plan remains appropriate, will follow acutely per POC.     Follow Up Recommendations  SNF;Supervision/Assistance - 24 hour    Equipment Recommendations  None recommended by OT    Recommendations for Other Services      Precautions / Restrictions Precautions Precautions: Fall Precaution Comments: + dizziness, intermittent double vision, nausea, difficulty swallowing Restrictions Weight Bearing Restrictions: No       Mobility Bed Mobility Overal bed mobility: Needs Assistance Bed Mobility: Rolling;Sidelying to Sit;Sit to Sidelying Rolling: Total assist;+2 for physical assistance Sidelying to sit: Total assist;+2 for physical  assistance;HOB elevated     Sit to sidelying: +2 for physical assistance;Total assist General bed mobility comments: Two person total assist to roll to R, come to sitting EOB with therapists managing legs and trunk.  Pt attempting to use left arm to pull, but not helping much.  Transfers                 General transfer comment: unable to attempt secondary to dizziness    Balance Overall balance assessment: Needs assistance Sitting-balance support: Feet supported;Bilateral upper extremity supported Sitting balance-Leahy Scale: Zero Sitting balance - Comments: Pt needed full support sitting EOB.  Reports intense dizziness that does not dissipate with >5 mins EOB.  Attempted to get pt to focus gaze, but he preferred eyes closed.  He was given IV zofran before session began.  He could not remain sitting due to unrelenting dizziness.                                   ADL either performed or assessed with clinical judgement   ADL Overall ADL's : Needs assistance/impaired                                     Functional mobility during ADLs: Total assistance;+2 for physical assistance (bed mobility only) General ADL Comments: pt currently requries total A +2 for all aspects of bed mobility, pt unable to tolerate sitting EOB longer than 5 mins d/t reports of dizziness, session conducted as co txt with PT who reports, "potential central pathology including very slight, but notable direction changing nystagmus"  suspect pt would requrie total for all other ADL tasks     Vision       Perception     Praxis      Cognition Arousal/Alertness: Awake/alert Behavior During Therapy: WFL for tasks assessed/performed Overall Cognitive Status: Impaired/Different from baseline Area of Impairment: Attention;Following commands;Safety/judgement;Awareness                   Current Attention Level: Focused   Following Commands: Follows one step commands with  increased time Safety/Judgement: Decreased awareness of safety;Decreased awareness of deficits Awareness: Intellectual Problem Solving: Slow processing;Decreased initiation;Difficulty sequencing;Requires verbal cues General Comments: pt seems to be a poor historian as pt unable to state details related to symptoms, pt following commands but with increased time and noted to be easily distracted often stating " I don't know"        Exercises     Shoulder Instructions       General Comments HR ranged from 120s-152 max    Pertinent Vitals/ Pain       Pain Assessment: Faces Faces Pain Scale: Hurts even more Pain Location: bil LEs sensitive to touch, also reporting low back pain with mobility. Pain Descriptors / Indicators: Moaning Pain Intervention(s): Limited activity within patient's tolerance;Monitored during session;Repositioned  Home Living                                          Prior Functioning/Environment              Frequency  Min 2X/week        Progress Toward Goals  OT Goals(current goals can now be found in the care plan section)  Progress towards OT goals: OT to reassess next treatment  Acute Rehab OT Goals Patient Stated Goal: to get stronger OT Goal Formulation: With patient Potential to Achieve Goals: Good  Plan Discharge plan remains appropriate;Frequency remains appropriate    Co-evaluation      Reason for Co-Treatment: Complexity of the patient's impairments (multi-system involvement);For patient/therapist safety;To address functional/ADL transfers;Necessary to address cognition/behavior during functional activity PT goals addressed during session: Mobility/safety with mobility;Balance;Strengthening/ROM OT goals addressed during session: ADL's and self-care      AM-PAC OT "6 Clicks" Daily Activity     Outcome Measure   Help from another person eating meals?: A Little Help from another person taking care of personal  grooming?: Total Help from another person toileting, which includes using toliet, bedpan, or urinal?: Total Help from another person bathing (including washing, rinsing, drying)?: Total Help from another person to put on and taking off regular upper body clothing?: Total Help from another person to put on and taking off regular lower body clothing?: Total 6 Click Score: 8    End of Session    OT Visit Diagnosis: Unsteadiness on feet (R26.81);Other abnormalities of gait and mobility (R26.89);Muscle weakness (generalized) (M62.81);Pain;Other symptoms and signs involving cognitive function   Activity Tolerance Treatment limited secondary to medical complications (Comment);Other (comment) (dizziness)   Patient Left in bed;with call bell/phone within reach;with bed alarm set   Nurse Communication Mobility status        Time: [id number] OT Time Calculation (min): 17 min  Charges: OT General Charges $OT Visit: 1 Visit OT Treatments $Therapeutic Activity: 8-22 mins  Harley Alto., COTA/L Acute Rehabilitation Services [place] 02/09/2020, 3:02 PM

## 2020-02-09 NOTE — Progress Notes (Signed)
Progress Note  Patient Name: Gary Butler Date of Encounter: 02/09/2020  [facility] Cardiologist: Nicki Guadalajara, MD  Subjective   Blood pressure and heart rate mildly improved with start of amiodarone. Feels slightly better but still very tired. No chest pain. Breathing unchanged.  Inpatient Medications    Scheduled Meds: . apixaban  5 mg Oral BID  . atorvastatin  40 mg Oral Daily  . digoxin  0.125 mg Oral Daily  . feeding supplement (GLUCERNA SHAKE)  237 mL Oral TID BM  . insulin aspart  0-15 Units Subcutaneous TID WC  . insulin aspart  0-5 Units Subcutaneous QHS  . metoprolol tartrate  12.5 mg Oral BID  . nystatin   Topical BID  . potassium chloride  40 mEq Oral Once  . sodium chloride flush  3 mL Intravenous Q12H  . tamsulosin  0.4 mg Oral Daily   Continuous Infusions: . sodium chloride 250 mL (02/02/20 2328)  . amiodarone 30 mg/hr (02/09/20 0841)  . magnesium sulfate bolus IVPB     PRN Meds: sodium chloride, acetaminophen **OR** acetaminophen, meclizine, morphine injection, ondansetron **OR** ondansetron (ZOFRAN) IV, oxyCODONE, promethazine, tiZANidine   Vital Signs    Vitals:   02/09/20 0749 02/09/20 0759 02/09/20 0800 02/09/20 0841  BP: 105/68  106/65   Pulse: (!) 120 (!) 117  (!) 122  Resp: 20     Temp: (!) 97.5 F (36.4 C)     TempSrc: Oral     SpO2: 97%  92%   Weight:      Height:        Intake/Output Summary (Last 24 hours) at 02/09/2020 0949 Last data filed at 02/08/2020 2052 Gross per 24 hour  Intake 216.77 ml  Output 350 ml  Net -133.23 ml   Last 3 Weights 02/09/2020 02/08/2020 02/07/2020  Weight (lbs) 275 lb 9.2 oz 277 lb 9 oz 283 lb 4.7 oz  Weight (kg) 125 kg 125.9 kg 128.5 kg      Telemetry    Atrial fibrillation, rates largely 100s-130s - Personally Reviewed  ECG    No new since 12/25 - Personally Reviewed  Physical Exam   GEN: Well nourished, well developed in no acute distress NECK: No JVD visible CARDIAC: tachycardic,  irregular irregular rhythm, normal S1 and S2, no rubs or gallops. No murmur appreciated. VASCULAR: Radial pulses 2+ bilaterally.  RESPIRATORY:  Clear to auscultation without rales, wheezing or rhonchi  ABDOMEN: Soft, non-tender, non-distended MUSCULOSKELETAL:  Moves all 4 limbs independently SKIN: Warm and dry, diffuse 3+ pitting edema NEUROLOGIC:  No focal neuro deficits noted. PSYCHIATRIC:  Normal affect   Labs    High Sensitivity Troponin:   Recent Labs  Lab 01/30/20 1246 01/30/20 1640  TROPONINIHS 28* 29*      Chemistry Recent Labs  Lab 02/03/20 0415 02/04/20 0130 02/05/20 1007 02/07/20 0639 02/08/20 0043 02/09/20 0144  NA 137 138   < > 133* 135 133*  K 3.7 3.1*   < > 3.2* 3.1* 3.8  CL 106 105   < > 102 101 101  CO2 19* 19*   < > 19* 24 18*  GLUCOSE 177* 139*   < > 138* 136* 162*  BUN 10 8   < > 9 9 14   CREATININE 0.89 0.72   < > 0.78 0.79 0.89  CALCIUM 8.5* 8.3*   < > 8.1* 7.9* 8.3*  PROT 4.3* 3.8*  --   --  3.6*  --   ALBUMIN 2.2* 1.9*  --   --  1.8*  --   AST 30 21  --   --  10*  --   ALT 16 15  --   --  11  --   ALKPHOS 56 49  --   --  55  --   BILITOT 1.5* 1.0  --   --  1.4*  --   GFRNONAA >60 >60   < > >60 >60 >60  ANIONGAP 12 14   < > 12 10 14    < > = values in this interval not displayed.     Hematology Recent Labs  Lab 02/07/20 0639 02/08/20 0043 02/09/20 0144  WBC 3.9* 3.3* 3.9*  RBC 4.58 4.13* 4.59  HGB 14.0 12.8* 13.8  HCT 40.6 35.7* 39.3  MCV 88.6 86.4 85.6  MCH 30.6 31.0 30.1  MCHC 34.5 35.9 35.1  RDW 15.9* 15.8* 15.9*  PLT 61* 52* 45*    BNP Recent Labs  Lab 02/06/20 1407  BNP 218.4*     DDimer No results for input(s): DDIMER in the last 168 hours.   Radiology    DG CHEST PORT 1 VIEW  Result Date: 02/08/2020 CLINICAL DATA:  Hypoxia EXAM: PORTABLE CHEST 1 VIEW COMPARISON:  02/06/2020 FINDINGS: Heart is normal size. No confluent airspace opacity or effusion. No acute bony abnormality. IMPRESSION: No active disease.  Electronically Signed   By: 02/08/2020 M.D.   On: 02/08/2020 09:50   ECHOCARDIOGRAM LIMITED  Result Date: 02/07/2020    ECHOCARDIOGRAM LIMITED REPORT   Patient Name:   Gary Butler  Date of Exam: 02/07/2020 Medical Rec #:  02/09/2020  Height:       71.0 in Accession #:    [id number] Weight:       283.3 lb Date of Birth:  12/08/1945  BSA:          2.445 m Patient Age:    74 years   BP:           92/66 mmHg Patient Gender: M          HR:           98 bpm. Exam Location:  Inpatient Procedure: Color Doppler, Limited Echo and Intracardiac Opacification Agent Indications:    CHF-Acute Diastolic  History:        Patient has prior history of Echocardiogram examinations, most                 recent 10/22/2019. CHF, Arrythmias:Atrial Fibrillation; Risk                 Factors:Sleep Apnea, Diabetes and Hypertension.  Sonographer:    12/22/2019 RDCS (AE) Referring Phys: Ross Ludwig [facility] O'NEAL  Sonographer Comments: Technically challenging study due to limited acoustic windows, Technically difficult study due to poor echo windows, no parasternal window, suboptimal apical window, no subcostal window and patient is morbidly obese. Image acquisition challenging due to patient body habitus and limited patient mobility. Extremely poor windows limit ability to obtain diagnostic images. IMPRESSIONS  1. Very limited study quality, LVEF determined by Definity echocontrast and appears normal 55-60%, right ventricle is not visualized. FINDINGS  Left Ventricle: Definity contrast agent was given IV to delineate the left ventricular endocardial borders. [facility] MD Electronically signed by Tobias Alexander MD Signature Date/Time: 02/07/2020/3:41:27 PM    Final     Cardiac Studies   Limited echo 02/07/20 1. Very limited study quality, LVEF determined by Definity echocontrast  and appears normal 55-60%, right ventricle is not visualized.  TTE  11/11/2019 1. Left ventricular ejection fraction, by estimation, is 60 to 65%.  The  left ventricle has normal function. The left ventricle has no regional  wall motion abnormalities. Left ventricular diastolic parameters are  consistent with Grade II diastolic  dysfunction (pseudonormalization).  2. Right ventricular systolic function is normal. The right ventricular  size is normal.  3. The mitral valve is normal in structure. No evidence of mitral valve  regurgitation. No evidence of mitral stenosis.  4. The aortic valve is normal in structure. Aortic valve regurgitation is  not visualized. No aortic stenosis is present.  5. The inferior vena cava is normal in size with greater than 50%  respiratory variability, suggesting right atrial pressure of 3 mmHg.   Patient Profile     74 y.o. male with diabetes, hypertension, A. fib, HFpEF, obesity who was admitted on 01/30/2020 for nausea/vomiting and found to have gangrenous cholecystitis.  Cardiology initially consulted for preoperative assessment.  Cardiology was reconsulted on 02/06/2020 for A. fib with RVR and low blood pressure.  Assessment & Plan    A. fib with RVR Hypotension -Digoxin loaded this admission.  Continue 0.125 mg daily. Will check dig level tomorrow given start of amiodarone 12/27, may need to reduce digoxin dose -was on bisoprolol as an outpatient. Started on IV diltiazem 02/06/20 for RVR, but even oral diltiazem worsened hypotension -started IV amiodarone 12/27 with improvements in rates/BP. Not yet at goal. If BP continues to improve, added low dose metoprolol with hold parameters -Continue Eliquis 5 mg twice daily -He was diagnosed with A. fib in October in the setting of perirectal abscess.  Given poor mobility, rate control strategy was recommended. Will aim for this, but IV amiodarone in the short term given issues with hypotension  Volume overloaded Elevated BNP/low albumin Acute on chronic diastolic heart failure -EF preserved on limited echo -Limited ability for diuresis given  pressures, but no JVD on exam, no rales appreciated -albumin 1.8, likely contributing to third spacing/edema -admission weight 118.4 kg, today's weight 125 kg. I/O inaccurate.  Hypokalemia: -K 3.8 today, will give 40 mEq PO  Hypomagnesemia -Mg 1.7 today, will give 2 gm IV  Type II diabetes -given risk of volume overload, would recommend alternative to pioglitazone as an outpatient -continue Jardiance as an outpatient -insulin while inpatient per primary team  Disposition: pending SNF when stable/bed available  For questions or updates, please contact [facility] Please consult [email or web address] for contact info under     Signed, Buford Dresser, MD  02/09/2020, 9:48 AM

## 2020-02-09 NOTE — Progress Notes (Signed)
1000- Noticed patient having difficulty swallowing water this am. Patient without signs of aspiration (no coughing or choking noted)  it appears he is having trouble initiating swallowing. Holding water in mouth swishing it around then moving head forward and backwards to swallow.  Pills given in pudding this AM patient able to swallow after several minutes. no signs of coughing with swallowing at this time. When asked about having trouble swallowing previously patient nodded head. Per RN shift report this AM patient took a while to take medications. Will monitor. Jaiona Simien, Randall An RN

## 2020-02-09 NOTE — Progress Notes (Addendum)
PROGRESS NOTE    Cord Straughan  [id number] DOB: 19-Dec-1945 DOA: 01/30/2020 PCP: [name], MD  Brief Narrative:HPI: Gary Butler is a 74/M chronically ill with history significant of OSA; psoriatic arthritis; morbid obesity (BMI 40); HTN; DM; BPH, chronic diastolic CHF (0000000 echo with preserved EF and grade 2 diastolic dysfunction) presentedwith nausea and anorexia.   -prior admission from 9/27-10/5 with perirectal abscess s/p I&D. he also had new onset afib and was started on Eliquis. --Last admitted from 10/9-13 with hematochezia; this was felt to be related to recent perineal surgery site bleeding and Eliquis was resumed at hospital d/c.  He was discharged to [facility] for rehab and was discharged to home in late November.   He has recently been referred to palliative care as an outpatient.  - He has had 3 recent ER visits to [facility] hospital most recently on 12/10.  He reported 8 months of anorexia, nausea and post-meal emesis.   -Progressively worsened, has been on ice chips for several weeks, lost about 100 pounds of weight. in the ED noted to have A. fib RVR, severe hypokalemia -CT abdomen pelvis concerning for emphysematous/gangrenous cholecystitis -Seen by general surgery and cleared for surgery by cardiology.  HIDA scan was also abnormal.  Underwent laparoscopic cholecystectomy on 02/02/2020 by Dr. Grandville Silos.  Was on IV antibiotics before and few days after surgery.  Preoperatively and perioperatively, he was managed with IV Cardizem for rate control.  He was then switched to oral Cardizem postoperatively and was doing fine.  He was evaluated by PT OT and they recommended discharging to SNF.  Initially he was reluctant but after counseling, he did agree to that.  He was waiting in the hospital pending placement when he developed problem with atrial fibrillation and RVR as well as hypotension.  Cardiology was reconsulted and they started him on digoxin and oral  diltiazem.  This dropped his blood pressure further.  Cardiology was reconsulted.  He was then started on IV amiodarone and continued on digoxin but Cardizem was stopped.  Assessment & Plan:   Sepsis Gangrenous/emphysematous cholecystitis -General surgery following, and seen by cardiology for clearance -Ongoing lingering symptoms for 6 to 8 months, suspect acute on chronic cholecystitis HIDA scan abnormal. Status post laparoscopic cholecystectomy on 02/02/2020 by Dr. Grandville Silos. Continue IV antibiotics for another day per general surgery.  Tolerating regular diet.  General surgery signed off.  Recent perirectal abscess  -With perirectal thickening on CT -Antibiotics as above, I wonder if this is unexpected in the setting of recent abscess and surgery  A. fib with RVR Chronic diastolic CHF Due to persistent A. fib with RVR early morning of 02/06/2020, cardiology was reconsulted.  Patient was already on Cardizem but having high heart rate.  He was started on digoxin and Cardizem was switched to short acting.  Rapid response was called on the evening of 02/07/2020 due to low blood pressure and some lethargy.  Patient was given some IV fluid bolus however this morning his blood pressure still is tenuous.  He still had A. fib with RVR with rates around 130.  Cardiology started him on IV amiodarone and continued him on oral digoxin.  Cardizem was stopped.  Patient is more alert and feels better than yesterday.  He is not on oxygen.  His blood pressure has improved a little bit but heart rate is still around 130 with RVR.  Management per cardiology.   Type 2 diabetes mellitus -Jardiance on hold, continue sliding  scale insulin.  Blood sugar controlled.  Mild dementia/cognitive deficits: Noted however patient seems to be completely alert and oriented.  Abnormal chest x-ray suspecting possible infiltrates: Repeat chest x-ray today does not show infiltrates.  He does not have pneumonia.  Morbid  obesity Obstructive sleep apnea -Has lost 100 pounds and needs continued weight loss -Has not been using BiPAP/CPAP following weight loss  Goals of care DO NOT RESUSCITATE -Followed by palliative care, not under hospice  Deconditioning: Seen by PT OT and they recommend SNF.  Patient initially reluctant for that but after further discussion and counseling, he is agreeable to do that.  TOC working on finding placement.  Vertigo: Continue meclizine as recommended by PT.  His symptoms are likely secondary to BPPV.  PT is working with him.  His symptoms are improving.  Fluid/volume overload: Patient has +2 pitting edema bilateral lower extremity which is likely due to hypoalbuminemia.  Ideally he would benefit from diuretics however there is no room for diuretics due to tenuous blood pressure.  Possible peripheral neuropathy: Patient is very tender to touch on both lower extremities.  Likely undiagnosed peripheral neuropathy in a patient who already has diabetes.  Will need to be started on gabapentin at the time of discharge.  Hypomagnesemia/hypokalemia: Potassium normal and magnesium 1.7.  Cardiology is replacing magnesium sulfate.  Possible concern of a stroke: This morning when I saw him, he had no neurological symptoms.  When he was assessed by PT later, per PT, he was showing some central vestibular signs, he had some nystagmus with some double vision and could not read sentence despite of putting on glasses, also had some drift in left upper extremity and some decreased coordination of left upper extremity with finger-nose coordination test compared to the right.  I was informed about those findings.  Also RN had noted that patient may have some dysphagia.  Patient was immediately placed on n.p.o. status, SLP was consulted and MRI brain without contrast was ordered.  I personally assessed patient at bedside and during my assessment, patient was slightly lethargic however he was able to move all  extremities.  Due to lethargy, he was not able to follow much commands.  He was being rolled down for MRI at that time.  We will follow MRI, if it stroke we will consult neurology.    DVT prophylaxis: Eliquis Code Status: DNR Family Communication: No family at bedside  Disposition Plan:  Status is: Inpatient  Remains inpatient appropriate because:Inpatient level of care appropriate due to severity of illness   Dispo: The patient is from: Home              Anticipated d/c is to: May need rehab              Anticipated d/c date is: 1 to 2 days              Patient currently is not medically stable to d/c.  Consultants:   General surgery  Cardiology   Procedures:   Antimicrobials:    Subjective: Patient seen and examined earlier this morning.  At that time, patient was more alert and awake compared to yesterday.  He also stated that he was feeling better than yesterday.  Denied any chest pain, palpitation shortness of breath or headache.  However later on as mentioned above, patient was showing some signs of vestibular stroke.  I did not find any of those findings during my assessment.  Objective: Vitals:   02/09/20 0841 02/09/20  1000 02/09/20 1113 02/09/20 1200  BP:  98/69 98/71 100/64  Pulse: (!) 122 (!) 121 (!) 106 (!) 126  Resp:  20  19  Temp:   98.1 F (36.7 C)   TempSrc:   Oral   SpO2:  96%  92%  Weight:      Height:        Intake/Output Summary (Last 24 hours) at 02/09/2020 1346 Last data filed at 02/08/2020 2052 Gross per 24 hour  Intake 216.77 ml  Output 350 ml  Net -133.23 ml   Filed Weights   02/07/20 0655 02/08/20 0330 02/09/20 0448  Weight: 128.5 kg 125.9 kg 125 kg    Examination:  General exam:   General exam: Appears calm and comfortable but looks very weak Respiratory system: Clear to auscultation. Respiratory effort normal. Cardiovascular system: S1 & S2 heard, irregularly irregular rate and rhythm. No JVD, murmurs, rubs, gallops or  clicks.  +2 pitting edema bilateral lower extremities and bilateral upper extremities. Gastrointestinal system: Abdomen is nondistended, soft and nontender. No organomegaly or masses felt. Normal bowel sounds heard. Central nervous system: Alert and oriented. No focal neurological deficits. Extremities: Symmetric 5 x 5 power. Skin: No rashes, lesions or ulcers.  Psychiatry: Judgement and insight appear normal. Mood & affect appropriate.    Data Reviewed:   CBC: Recent Labs  Lab 02/06/20 0130 02/06/20 1407 02/07/20 0639 02/08/20 0043 02/09/20 0144  WBC 3.9* 3.9* 3.9* 3.3* 3.9*  NEUTROABS  --  2.9 2.5 2.2 2.8  HGB 14.2 13.4 14.0 12.8* 13.8  HCT 38.7* 39.0 40.6 35.7* 39.3  MCV 85.8 88.0 88.6 86.4 85.6  PLT 76* 71* 61* 52* 45*   Basic Metabolic Panel: Recent Labs  Lab 02/04/20 0130 02/05/20 1007 02/06/20 1407 02/07/20 0639 02/08/20 0043 02/09/20 0144  NA 138 136 136 133* 135 133*  K 3.1* 4.9 3.0* 3.2* 3.1* 3.8  CL 105 108 105 102 101 101  CO2 19* 14* 21* 19* 24 18*  GLUCOSE 139* 128* 128* 138* 136* 162*  BUN 8 11 10 9 9 14   CREATININE 0.72 0.77 0.80 0.78 0.79 0.89  CALCIUM 8.3* 8.0* 8.2* 8.1* 7.9* 8.3*  MG 1.6* 2.0  --  1.7  --  1.7   GFR: Estimated Creatinine Clearance: 96.6 mL/min (by C-G formula based on SCr of 0.89 mg/dL). Liver Function Tests: Recent Labs  Lab 02/03/20 0415 02/04/20 0130 02/08/20 0043  AST 30 21 10*  ALT 16 15 11   ALKPHOS 56 49 55  BILITOT 1.5* 1.0 1.4*  PROT 4.3* 3.8* 3.6*  ALBUMIN 2.2* 1.9* 1.8*   No results for input(s): LIPASE, AMYLASE in the last 168 hours. No results for input(s): AMMONIA in the last 168 hours. Coagulation Profile: No results for input(s): INR, PROTIME in the last 168 hours. Cardiac Enzymes: No results for input(s): CKTOTAL, CKMB, CKMBINDEX, TROPONINI in the last 168 hours. BNP (last 3 results) No results for input(s): PROBNP in the last 8760 hours. HbA1C: No results for input(s): HGBA1C in the last 72  hours. CBG: Recent Labs  Lab 02/08/20 1129 02/08/20 1630 02/08/20 2127 02/09/20 0613 02/09/20 1107  GLUCAP 166* 147* 169* 184* 174*   Lipid Profile: No results for input(s): CHOL, HDL, LDLCALC, TRIG, CHOLHDL, LDLDIRECT in the last 72 hours. Thyroid Function Tests: No results for input(s): TSH, T4TOTAL, FREET4, T3FREE, THYROIDAB in the last 72 hours. Anemia Panel: No results for input(s): VITAMINB12, FOLATE, FERRITIN, TIBC, IRON, RETICCTPCT in the last 72 hours. Urine analysis:  Component Value Date/Time   COLORURINE YELLOW 02/06/2020 1309   APPEARANCEUR CLEAR 02/06/2020 1309   LABSPEC 1.010 02/06/2020 1309   PHURINE 5.0 02/06/2020 1309   GLUCOSEU 50 (A) 02/06/2020 1309   HGBUR SMALL (A) 02/06/2020 1309   BILIRUBINUR NEGATIVE 02/06/2020 1309   KETONESUR 5 (A) 02/06/2020 1309   PROTEINUR NEGATIVE 02/06/2020 1309   UROBILINOGEN 1.0 06/10/2013 1000   NITRITE NEGATIVE 02/06/2020 1309   LEUKOCYTESUR NEGATIVE 02/06/2020 1309   Sepsis Labs: @LABRCNTIP (procalcitonin:4,lacticidven:4)  ) Recent Results (from the past 240 hour(s))  Culture, blood (routine x 2)     Status: None   Collection Time: 01/30/20  4:40 PM   Specimen: BLOOD RIGHT FOREARM  Result Value Ref Range Status   Specimen Description BLOOD RIGHT FOREARM  Final   Special Requests   Final    BOTTLES DRAWN AEROBIC AND ANAEROBIC Blood Culture results may not be optimal due to an inadequate volume of blood received in culture bottles   Culture   Final    NO GROWTH 5 DAYS Performed at [facility], [place] [address], [place], [place] [address]    Report Status 02/04/2020 FINAL  Final  Culture, blood (routine x 2)     Status: None   Collection Time: 01/30/20  9:54 PM   Specimen: BLOOD  Result Value Ref Range Status   Specimen Description BLOOD LEFT HAND  Final   Special Requests   Final    BOTTLES DRAWN AEROBIC ONLY Blood Culture adequate volume   Culture   Final    NO GROWTH 5 DAYS Performed at [facility], [place] [address], [place], [place] [address]    Report Status 02/04/2020 FINAL  Final  MRSA PCR Screening     Status: None   Collection Time: 01/31/20  7:34 AM   Specimen: Nasal Mucosa; Nasopharyngeal  Result Value Ref Range Status   MRSA by PCR NEGATIVE NEGATIVE Final    Comment:        The GeneXpert MRSA Assay (FDA approved for NASAL specimens only), is one component of a comprehensive MRSA colonization surveillance program. It is not intended to diagnose MRSA infection nor to guide or monitor treatment for MRSA infections. Performed at [facility], [place] [address], [place], [place] [address]   Surgical pcr screen     Status: Abnormal   Collection Time: 02/02/20  6:06 AM   Specimen: Nasal Mucosa; Nasal Swab  Result Value Ref Range Status   MRSA, PCR NEGATIVE NEGATIVE Final   Staphylococcus aureus POSITIVE (A) NEGATIVE Final    Comment: (NOTE) The Xpert SA Assay (FDA approved for NASAL specimens in patients 9 years of age and older), is one component of a comprehensive surveillance program. It is not intended to diagnose infection nor to guide or monitor treatment. Performed at [facility], [place] [address], [place], [place] [address]   Culture, blood (Routine X 2) w Reflex to ID Panel     Status: None (Preliminary result)   Collection Time: 02/06/20  2:07 PM   Specimen: BLOOD  Result Value Ref Range Status   Specimen Description BLOOD RIGHT ANTECUBITAL  Final   Special Requests AEROBIC BOTTLE ONLY Blood Culture adequate volume  Final   Culture   Final    NO GROWTH 2 DAYS Performed at [facility], [place] [address] [place], [place] [address]    Report Status PENDING  Incomplete  Culture, blood (Routine X 2) w Reflex to ID  Panel     Status: None (Preliminary result)   Collection Time: 02/06/20  2:22 PM   Specimen: BLOOD  Result Value Ref Range Status   Specimen Description BLOOD BLOOD RIGHT HAND  Final   Special Requests   Final    BOTTLES  DRAWN AEROBIC AND ANAEROBIC Blood Culture adequate volume   Culture   Final    NO GROWTH 2 DAYS Performed at [facility], [address], [place], [place] [address]    Report Status PENDING  Incomplete    Radiology Studies: DG CHEST PORT 1 VIEW  Result Date: 02/08/2020 CLINICAL DATA:  Hypoxia EXAM: PORTABLE CHEST 1 VIEW COMPARISON:  02/06/2020 FINDINGS: Heart is normal size. No confluent airspace opacity or effusion. No acute bony abnormality. IMPRESSION: No active disease. Electronically Signed   By: Charlett Nose M.D.   On: 02/08/2020 09:50   ECHOCARDIOGRAM LIMITED  Result Date: 02/07/2020    ECHOCARDIOGRAM LIMITED REPORT   Patient Name:   EDGE MAUGER  Date of Exam: 02/07/2020 Medical Rec #:  [id number]  Height:       71.0 in Accession #:    [id number] Weight:       283.3 lb Date of Birth:  [birth year]  BSA:          2.445 m Patient Age:    74 years   BP:           92/66 mmHg Patient Gender: M          HR:           98 bpm. Exam Location:  Inpatient Procedure: Color Doppler, Limited Echo and Intracardiac Opacification Agent Indications:    CHF-Acute Diastolic  History:        Patient has prior history of Echocardiogram examinations, most                 recent 10/22/2019. CHF, Arrythmias:Atrial Fibrillation; Risk                 Factors:Sleep Apnea, Diabetes and Hypertension.  Sonographer:    Ross Ludwig RDCS (AE) Referring Phys: [id number] [facility]  Sonographer Comments: Technically challenging study due to limited acoustic windows, Technically difficult study due to poor echo windows, no parasternal window, suboptimal apical window, no subcostal window and patient is morbidly obese. Image acquisition challenging due to patient body habitus and limited patient mobility. Extremely poor windows limit ability to obtain diagnostic images. IMPRESSIONS  1. Very limited study quality, LVEF determined by Definity echocontrast and appears normal 55-60%, right ventricle is not visualized. FINDINGS   Left Ventricle: Definity contrast agent was given IV to delineate the left ventricular endocardial borders. Tobias Alexander MD Electronically signed by Tobias Alexander MD Signature Date/Time: 02/07/2020/3:41:27 PM    Final    Scheduled Meds: . apixaban  5 mg Oral BID  . atorvastatin  40 mg Oral Daily  . digoxin  0.125 mg Oral Daily  . feeding supplement (GLUCERNA SHAKE)  237 mL Oral TID BM  . insulin aspart  0-15 Units Subcutaneous TID WC  . insulin aspart  0-5 Units Subcutaneous QHS  . metoprolol tartrate  12.5 mg Oral BID  . nystatin   Topical BID  . sodium chloride flush  3 mL Intravenous Q12H  . tamsulosin  0.4 mg Oral Daily   Continuous Infusions: . sodium chloride 250 mL (02/02/20 2328)  . 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  Chronic right corona radiata and left cerebellar lacunar insults.  Age-related cerebral atrophy and moderate chronic microvascular ischemic changes.  Consulted Dr. Cheral Marker of neurology who is going to see patient and manage.

## 2020-02-09 NOTE — Consult Note (Signed)
Neurology Consultation  Reason for Consult: Left-sided weakness, dizziness, stroke Referring Physician: Dr Lars Masson hospitalist  CC: Left-sided weakness, left upper extremity incoordination, double vision, dizziness  History is obtained from: Chart, patient  HPI: Gary Butler is a 74 y.o. male with a complicated past medical history and a very complicated contact course-past medical history of sleep apnea, psoriatic arthritis, morbid obesity, hypertension, diabetes, chronic diastolic CHF  initially presented with nausea and anorexia 10 days or so ago, noted to have gangrenous/emphysematous cholecystitis which was managed surgically followed by IV antibiotics, recent perirectal abscess, A. fib with RVR and chronic CHF which are also being managed by medicine and cardiology, history of vestibular pathology with chronic dizziness noted to have increasing dizziness, left-sided weakness and left upper extremity incoordination this morning while working with PT and a subsequent MRI that showed a punctate 2 to 3 mm right centrum semiovale acute/subacute infarct, for which neurology was consulted. The patient reports that he has been very deconditioned and is chronically dizzy-he was unable to tell me clearly if this was more of room spinning versus lightheadedness but said yes to both, this morning, while working with physical therapy, was noted to have focal left-sided deficits.  No clear last known well could be established.  Chart review reveals multiple medical issues that are being acutely managed by internal medicine, cardiology, and surgery including cholecystitis, A. fib with RVR, chronic vertigo, fluid/volume overload, electrolyte abnormalities, type 2 diabetes and recent perirectal abscess.  Currently on Eliquis for stroke prevention given history of atrial fibrillation.  LKW: Sometime this morning or yesterday-unclear tpa given?: no, unclear last well Premorbid modified Rankin scale  (mRS):5-completely bedbound   ROS: Obtained and negative except as noted in HPI.  Past Medical History:  Diagnosis Date  . Arthritis   . CHF (congestive heart failure) ([place])   . Constipation   . Diabetes mellitus   . Heart murmur   . History of gout   . History of skin cancer   . Hypertension   . Morbid (severe) obesity due to excess calories ([place])   . OSA (obstructive sleep apnea)   . Psoriatic arthritis ([place])   . Rectal fissure   . Tinnitus     Family History  Problem Relation Age of Onset  . Emphysema Father   . Clotting disorder Father   . Arthritis Father   . Heart disease Mother   . Arthritis Mother   . Arthritis Maternal Grandmother   . Hypertension Maternal Grandmother   . Diabetes Maternal Grandmother   . Arthritis Paternal Grandmother   . Diabetes Maternal Aunt   . Diabetes Paternal Aunt     Social History:   reports that he has never smoked. He has never used smokeless tobacco. He reports current alcohol use. He reports that he does not use drugs.  Medications  Current Facility-Administered Medications:  .  0.9 %  sodium chloride infusion, , Intravenous, PRN, Domenic Polite, MD, Last Rate: 10 mL/hr at 02/02/20 2328, 250 mL at 02/02/20 2328 .  acetaminophen (TYLENOL) tablet 650 mg, 650 mg, Oral, Q6H PRN, 650 mg at 02/03/20 2225 **OR** acetaminophen (TYLENOL) suppository 650 mg, 650 mg, Rectal, Q6H PRN, Georganna Skeans, MD .  [EXPIRED] amiodarone (NEXTERONE PREMIX) 360-4.14 MG/200ML-% (1.8 mg/mL) IV infusion, 60 mg/hr, Intravenous, Continuous, Last Rate: 33.3 mL/hr at 02/08/20 1046, 60 mg/hr at 02/08/20 1046 **FOLLOWED BY** amiodarone (NEXTERONE PREMIX) 360-4.14 MG/200ML-% (1.8 mg/mL) IV infusion, 30 mg/hr, Intravenous, Continuous, Buford Dresser, MD, Last Rate: 16.67 mL/hr at  02/09/20 1516, 30 mg/hr at 02/09/20 1516 .  apixaban (ELIQUIS) tablet 5 mg, 5 mg, Oral, BID, Wendee Beavers, RPH, 5 mg at 02/09/20 [id number] .  atorvastatin (LIPITOR) tablet 40 mg, 40  mg, Oral, Daily, Pahwani, Ravi, MD, 40 mg at 02/09/20 0841 .  digoxin (LANOXIN) tablet 0.125 mg, 0.125 mg, Oral, Daily, O'Neal, Cassie Freer, MD, 0.125 mg at 02/09/20 0841 .  feeding supplement (GLUCERNA SHAKE) (GLUCERNA SHAKE) liquid 237 mL, 237 mL, Oral, TID BM, Meuth, Brooke A, PA-C, 237 mL at 02/09/20 0841 .  insulin aspart (novoLOG) injection 0-15 Units, 0-15 Units, Subcutaneous, TID WC, Georganna Skeans, MD, 3 Units at 02/09/20 1651 .  insulin aspart (novoLOG) injection 0-5 Units, 0-5 Units, Subcutaneous, QHS, Georganna Skeans, MD .  meclizine (ANTIVERT) tablet 12.5 mg, 12.5 mg, Oral, TID PRN, Darliss Cheney, MD, 12.5 mg at 02/08/20 2349 .  metoprolol tartrate (LOPRESSOR) tablet 12.5 mg, 12.5 mg, Oral, BID, Buford Dresser, MD .  morphine 2 MG/ML injection 2 mg, 2 mg, Intravenous, Q4H PRN, Meuth, Brooke A, PA-C .  nystatin (MYCOSTATIN/NYSTOP) topical powder, , Topical, BID, Darliss Cheney, MD, Given at 02/09/20 [phone] .  ondansetron (ZOFRAN) tablet 4 mg, 4 mg, Oral, Q6H PRN **OR** ondansetron (ZOFRAN) injection 4 mg, 4 mg, Intravenous, Q6H PRN, Georganna Skeans, MD, 4 mg at 02/09/20 1126 .  oxyCODONE (Oxy IR/ROXICODONE) immediate release tablet 5-10 mg, 5-10 mg, Oral, Q4H PRN, Meuth, Brooke A, PA-C, 10 mg at 02/08/20 2350 .  promethazine (PHENERGAN) injection 12.5 mg, 12.5 mg, Intravenous, Q6H PRN, Georganna Skeans, MD .  sodium chloride flush (NS) 0.9 % injection 3 mL, 3 mL, Intravenous, Q12H, Georganna Skeans, MD, 3 mL at 02/08/20 2351 .  tamsulosin (FLOMAX) capsule 0.4 mg, 0.4 mg, Oral, Daily, Pahwani, Ravi, MD, 0.4 mg at 02/09/20 0841 .  tiZANidine (ZANAFLEX) tablet 4 mg, 4 mg, Oral, Daily PRN, Darliss Cheney, MD, 4 mg at 02/08/20 2349   Exam: Current vital signs: BP 104/84   Pulse (!) 116   Temp 98.6 F (37 C) (Oral)   Resp 19   Ht 5\' 11"  (1.803 m)   Wt 125 kg   SpO2 94%   BMI 38.43 kg/m  Vital signs in last 24 hours: Temp:  [97.5 F (36.4 C)-98.6 F (37 C)] 98.6 F (37 C)  (12/28 1620) Pulse Rate:  [72-136] 116 (12/28 1800) Resp:  [19-24] 19 (12/28 1800) BP: (98-114)/(55-84) 104/84 (12/28 1800) SpO2:  [92 %-98 %] 94 % (12/28 1800) Weight:  [id number] kg] 125 kg (12/28 0448) General: Awake alert in no distress HEENT: Cephalic atraumatic Lungs: Distant sounds but no obvious rales Cardiovascular: Irregular irregular Abdomen: Obese, mild generalized tenderness Extremities with 2+ edema Neurological examination Awake alert oriented x3 Speech is nondysarthric Has mild reduction in attention concentration as well as short-term memory No obvious word finding difficulty or difficulty with comprehension or repetition or naming. Cranial nerves: Pupils equal round react light, extraocular movements intact without nystagmus, visual fields full, facial symmetry is preserved, facial sensations intact bilaterally, tongue and palate midline. Motor exam: No drift in bilateral upper extremities with symmetric 4+/5 strength.  Lower extremity examination with significant weakness barely 3/5 at the hip on the left lower extremity, distally 4+/5 plantar and dorsiflexors.  Right lower extremity 4/5 with drift with plantar and dorsiflexors again 4+/5. Sensory exam: Glove and stocking pattern of sensory loss without any focality. Coordination: No dysmetria noted on finger-nose-finger testing.  Unable to do heel-knee-shin. Gait testing was deferred NIH stroke scale 1a Level  of Conscious.: 0 1b LOC Questions: 0 1c LOC Commands: 0 2 Best Gaze: 0 3 Visual: 0 4 Facial Palsy: 0 5a Motor Arm - left: 0 5b Motor Arm - Right: 0 6a Motor Leg - Left: 3 6b Motor Leg - Right: 1 7 Limb Ataxia: 0 8 Sensory: 0 9 Best Language: 0 10 Dysarthria: 0 11 Extinct. and Inatten.: 0 TOTAL: 4   Labs I have reviewed labs in epic and the results pertinent to this consultation are:  CBC    Component Value Date/Time   WBC 3.9 (L) 02/09/2020 0144   RBC 4.59 02/09/2020 0144   HGB 13.8 02/09/2020 0144    HGB 15.9 07/19/2014 1133   HCT 39.3 02/09/2020 0144   HCT 46.4 07/19/2014 1133   PLT 45 (L) 02/09/2020 0144   PLT 165 07/19/2014 1133   MCV 85.6 02/09/2020 0144   MCV 92.6 07/19/2014 1133   MCH 30.1 02/09/2020 0144   MCHC 35.1 02/09/2020 0144   RDW 15.9 (H) 02/09/2020 0144   RDW 15.8 (H) 07/19/2014 1133   LYMPHSABS 0.9 02/09/2020 0144   LYMPHSABS 1.0 07/19/2014 1133   MONOABS 0.1 02/09/2020 0144   MONOABS 0.6 07/19/2014 1133   EOSABS 0.1 02/09/2020 0144   EOSABS 0.3 07/19/2014 1133   BASOSABS 0.0 02/09/2020 0144   BASOSABS 0.0 07/19/2014 1133    CMP     Component Value Date/Time   NA 133 (L) 02/09/2020 0144   NA 140 12/02/2019 0000   NA 139 06/17/2013 0946   K 3.8 02/09/2020 0144   K 3.5 06/17/2013 0946   CL 101 02/09/2020 0144   CL 102 05/14/2012 0856   CO2 18 (L) 02/09/2020 0144   CO2 27 06/17/2013 0946   GLUCOSE 162 (H) 02/09/2020 0144   GLUCOSE 184 (H) 06/17/2013 0946   GLUCOSE 135 (H) 05/14/2012 0856   BUN 14 02/09/2020 0144   BUN 5 12/02/2019 0000   BUN 13.8 06/17/2013 0946   CREATININE 0.89 02/09/2020 0144   CREATININE 0.95 07/14/2015 1019   CREATININE 1.1 06/17/2013 0946   CALCIUM 8.3 (L) 02/09/2020 0144   CALCIUM 9.3 06/17/2013 0946   PROT 3.6 (L) 02/08/2020 0043   PROT 5.7 (L) 06/17/2013 0946   ALBUMIN 1.8 (L) 02/08/2020 0043   ALBUMIN 3.2 (L) 06/17/2013 0946   AST 10 (L) 02/08/2020 0043   AST 19 06/17/2013 0946   ALT 11 02/08/2020 0043   ALT 18 06/17/2013 0946   ALKPHOS 55 02/08/2020 0043   ALKPHOS 101 06/17/2013 0946   BILITOT 1.4 (H) 02/08/2020 0043   BILITOT 1.54 (H) 06/17/2013 0946   GFRNONAA >60 02/09/2020 0144   GFRAA >90 12/02/2019 0000     Imaging I have reviewed the images obtained:  MRI brain.  Right centrum semiovale acute/subacute infarct. Chronic microangiopathy  2D echo on 02/07/2020 with very limited quality study, LVEF 55 to 60%.  Right ventricle not visualized.  Assessment:  74 year old man with above past medical  history, noted to have a right centrum semiovale punctate acute to subacute stroke due to concerns for left-sided weakness as well as dizziness-acute on chronic dizziness. Has a history of atrial fibrillation, currently on Eliquis and has multiple other comorbidities being managed currently-I suspect the etiology of the stroke is cardioembolic. Location of the stroke does not explain dizziness.  Likely secondary to BPPV, deconditioning as well as possibly a peripheral process.  Impression: Acute ischemic stroke-cardioembolic Dizziness/peripheral vertigo  Recommendations: Continue telemetry Frequent neurochecks CTA head and neck Continue Eliquis-small stroke  with low risk of HT Echo on 02/07/2020 was severely limited.  Would recommend repeating echo. PT OT speech therapy Lipid panel A1c PT OT speech therapy N.p.o. until cleared by bedside swallow evaluation or formal swallow evaluation. Stroke team will follow. Stroke order set has been placed in the patient's chart. The plan was relayed to primary hospitalist via secure chat.   -- Amie Portland, MD [facility] Pager: [phone] If 7pm to 7am, please call on call as listed on AMION.

## 2020-02-09 NOTE — Progress Notes (Signed)
Physical Therapy Treatment Patient Details Name: Gary Butler MRN: [id number] DOB: 08-01-1945 Today's Date: 02/09/2020    History of Present Illness Gary Butler is a 74/M chronically ill with history significant of OSA; psoriatic arthritis; morbid obesity (BMI 40); HTN; DM; BPH, chronic diastolic CHF (10/2019 echo with preserved EF and grade 2 diastolic dysfunction) presentedwith nausea and anorexia.    -prior admission from 9/27-10/5 with perirectal abscess s/p I&D. he also had new onset afib and was started on Eliquis.Ongoing lingering symptoms for 6 to 8 months, suspect acute on chronic cholecystitis.  Lap cholecystectomy 12/21.    PT Comments    Pt showing some signs of potential central pathology including very slight, but notable direction changing nystagmus, L UE difficulty with finger to nose coordination testing, reports of double vision,  L UE drifts down when asked to hold it up equal to right with eyes closed (however it did not flip up into pronation and it was slow drift down).  He has near constant vertigo in sitting EOB (>5 mins) without relenting or improving with increased time there.  I could not due a full assessment due to pt's intolerance of mobility at this time, but these worrisome signs were reported to RN and MD.  Pt positioned back in supine with eyes closed.  He was pre medicated with zofran IV, but not Antivert due to difficulty swallowing medications per RN. PT to continue to follow acutely for vestibular symptoms assessment and mobility.    Follow Up Recommendations  SNF;Supervision/Assistance - 24 hour     Equipment Recommendations  Wheelchair (measurements PT);Wheelchair cushion (measurements PT);Hospital bed;Other (comment) (hoyer lift)    Recommendations for Other Services       Precautions / Restrictions Precautions Precautions: Fall Precaution Comments: + dizziness, intermittent double vision, nausea, difficulty swallowing    Mobility  Bed Mobility   Bed  Mobility: Rolling;Sidelying to Sit Rolling: Total assist;+2 for physical assistance Sidelying to sit: Total assist;+2 for physical assistance;HOB elevated (HOB ~20 degrees)     Sit to sidelying: +2 for physical assistance;Total assist General bed mobility comments: Two person total assist to roll to R, come to sitting EOB with therapists managing legs and trunk.  Pt attempting to use left arm to pull, but not helping much.  Transfers                    Ambulation/Gait                 Stairs             Wheelchair Mobility    Modified Rankin (Stroke Patients Only) Modified Rankin (Stroke Patients Only) Pre-Morbid Rankin Score: Moderate disability Modified Rankin: Severe disability     Balance Overall balance assessment: Needs assistance Sitting-balance support: Feet supported;Bilateral upper extremity supported Sitting balance-Leahy Scale: Zero Sitting balance - Comments: Pt needed full support sitting EOB.  Reports intense dizziness that does not dissipate with >5 mins EOB.  Attempted to get pt to focus gaze, but he preferred eyes closed.  He was given IV zofran before session began.  He could not remain sitting due to unrelenting dizziness.             02/09/20 1251  Vestibular Assessment  General Observation Pt eyes closed in bed, when PT introduced herself, pt closing one eye to look at PT.  PT asked if he was having double vision, he said "yes" but could not report how long.  Symptom Behavior  Subjective history  of current problem Pt dizzy, significantly so with rolling, cannot report for how long, wears glasses for reading, but reporting some double vision (not constant, but intermittent).  Type of Dizziness  Spinning  Frequency of Dizziness with movement, and constant when seated today  Duration of Dizziness long>5 mins  Symptom Nature Motion provoked;Positional;Constant (constant when upright)  Aggravating Factors Supine to sit;Rolling to  right;Rolling to left;Activity in general  Relieving Factors Closing eyes;Lying supine  Progression of Symptoms Worse  History of similar episodes Not that he can report accurately  Oculomotor Exam  Oculomotor Alignment Normal  Gaze-induced  Right beating nystagmus with R gaze;Left beating nystagmus with L gaze (with slight upward rotational component)  Smooth Pursuits  (nystagmus makes it look saccadic)  Comment Pt with very limited tolerance of assessment due to nausea.                             Cognition Arousal/Alertness: Awake/alert Behavior During Therapy: WFL for tasks assessed/performed Overall Cognitive Status: Impaired/Different from baseline                                 General Comments: Pt is alert and oriented x 4, however, when asked details of his symptoms he "can't remember".  I asked him when his swallowing difficulty started.  He reported "I don't know".  Very vague responses.      Exercises      General Comments General comments (skin integrity, edema, etc.): HR ranged from 120s-152 max observed with bed level and EOB activity.  Pt in on amio drip. Also, showing some central vestibular signs (double vision, decreased L UE coordination compared to R with finger to nose test, drift when asked to hold both arms up and keep them there closing eyes L arm slowly drifts down, R stays in place), difficulty reading menu with glassess donned (he skips all over several rows of text, unable to read straight across).  (+) direction changing nystagmus as we move from R gaze to left gaze with slight rotational component in both directions.  RN also reporting pt having difficulty swallowing, moves things around in her mouth      Pertinent Vitals/Pain Pain Assessment: Faces Faces Pain Scale: Hurts even more Pain Location: bil LEs sensitive to touch, also reporting low back pain with mobility.    Home Living                      Prior  Function            PT Goals (current goals can now be found in the care plan section) Acute Rehab PT Goals Patient Stated Goal: to get stronger Progress towards PT goals: Not progressing toward goals - comment (r/o stroke)    Frequency    Min 2X/week (increase to 3xs if (+) stroke)      PT Plan Current plan remains appropriate    Co-evaluation PT/OT/SLP Co-Evaluation/Treatment: Yes Reason for Co-Treatment: Complexity of the patient's impairments (multi-system involvement);Necessary to address cognition/behavior during functional activity;For patient/therapist safety;To address functional/ADL transfers PT goals addressed during session: Mobility/safety with mobility;Balance;Strengthening/ROM        AM-PAC PT "6 Clicks" Mobility   Outcome Measure  Help needed turning from your back to your side while in a flat bed without using bedrails?: Total Help needed moving from lying on your back to sitting  on the side of a flat bed without using bedrails?: Total Help needed moving to and from a bed to a chair (including a wheelchair)?: Total Help needed standing up from a chair using your arms (e.g., wheelchair or bedside chair)?: Total Help needed to walk in hospital room?: Total Help needed climbing 3-5 steps with a railing? : Total 6 Click Score: 6    End of Session   Activity Tolerance: Other (comment) (limited by high HR and dizziness/nausea) Patient left: in bed;with call bell/phone within reach;with bed alarm set   PT Visit Diagnosis: Muscle weakness (generalized) (M62.81);Unsteadiness on feet (R26.81);Dizziness and giddiness (R42)     Time: [id number] PT Time Calculation (min) (ACUTE ONLY): 26 min  Charges:   1 re-eval           Verdene Lennert, PT, DPT  Acute Rehabilitation [phone] pager #[phone] office       Wells Guiles B Eppie Barhorst 02/09/2020, 12:57 PM

## 2020-02-10 ENCOUNTER — Inpatient Hospital Stay (HOSPITAL_COMMUNITY): Payer: Medicare PPO

## 2020-02-10 ENCOUNTER — Inpatient Hospital Stay: Payer: Self-pay

## 2020-02-10 DIAGNOSIS — R1310 Dysphagia, unspecified: Secondary | ICD-10-CM

## 2020-02-10 DIAGNOSIS — A419 Sepsis, unspecified organism: Secondary | ICD-10-CM | POA: Diagnosis not present

## 2020-02-10 DIAGNOSIS — I6310 Cerebral infarction due to embolism of unspecified precerebral artery: Secondary | ICD-10-CM

## 2020-02-10 DIAGNOSIS — E43 Unspecified severe protein-calorie malnutrition: Secondary | ICD-10-CM | POA: Insufficient documentation

## 2020-02-10 DIAGNOSIS — I6340 Cerebral infarction due to embolism of unspecified cerebral artery: Secondary | ICD-10-CM | POA: Diagnosis not present

## 2020-02-10 DIAGNOSIS — I480 Paroxysmal atrial fibrillation: Secondary | ICD-10-CM

## 2020-02-10 DIAGNOSIS — I6389 Other cerebral infarction: Secondary | ICD-10-CM | POA: Diagnosis not present

## 2020-02-10 DIAGNOSIS — L8990 Pressure ulcer of unspecified site, unspecified stage: Secondary | ICD-10-CM | POA: Insufficient documentation

## 2020-02-10 DIAGNOSIS — I4891 Unspecified atrial fibrillation: Secondary | ICD-10-CM | POA: Diagnosis not present

## 2020-02-10 DIAGNOSIS — K82A1 Gangrene of gallbladder in cholecystitis: Secondary | ICD-10-CM | POA: Diagnosis not present

## 2020-02-10 LAB — CBC WITH DIFFERENTIAL/PLATELET
Abs Immature Granulocytes: 0.04 10*3/uL (ref 0.00–0.07)
Basophils Absolute: 0 10*3/uL (ref 0.0–0.1)
Basophils Relative: 1 %
Eosinophils Absolute: 0.1 10*3/uL (ref 0.0–0.5)
Eosinophils Relative: 3 %
HCT: 36.7 % — ABNORMAL LOW (ref 39.0–52.0)
Hemoglobin: 13.2 g/dL (ref 13.0–17.0)
Immature Granulocytes: 1 %
Lymphocytes Relative: 24 %
Lymphs Abs: 0.8 10*3/uL (ref 0.7–4.0)
MCH: 30.9 pg (ref 26.0–34.0)
MCHC: 36 g/dL (ref 30.0–36.0)
MCV: 85.9 fL (ref 80.0–100.0)
Monocytes Absolute: 0.1 10*3/uL (ref 0.1–1.0)
Monocytes Relative: 2 %
Neutro Abs: 2.4 10*3/uL (ref 1.7–7.7)
Neutrophils Relative %: 69 %
Platelets: 31 10*3/uL — ABNORMAL LOW (ref 150–400)
RBC: 4.27 MIL/uL (ref 4.22–5.81)
RDW: 16.1 % — ABNORMAL HIGH (ref 11.5–15.5)
WBC: 3.4 10*3/uL — ABNORMAL LOW (ref 4.0–10.5)
nRBC: 0 % (ref 0.0–0.2)

## 2020-02-10 LAB — LIPID PANEL
Cholesterol: 87 mg/dL (ref 0–200)
HDL: 28 mg/dL — ABNORMAL LOW (ref >40)
LDL Cholesterol: 38 mg/dL (ref 0–99)
Total CHOL/HDL Ratio: 3.1 RATIO
Triglycerides: 106 mg/dL (ref <150)
VLDL: 21 mg/dL (ref 0–40)

## 2020-02-10 LAB — BASIC METABOLIC PANEL
Anion gap: 12 (ref 5–15)
BUN: 11 mg/dL (ref 8–23)
CO2: 21 mmol/L — ABNORMAL LOW (ref 22–32)
Calcium: 8.1 mg/dL — ABNORMAL LOW (ref 8.9–10.3)
Chloride: 101 mmol/L (ref 98–111)
Creatinine, Ser: 0.75 mg/dL (ref 0.61–1.24)
GFR, Estimated: >60 mL/min (ref >60)
Glucose, Bld: 152 mg/dL — ABNORMAL HIGH (ref 70–99)
Potassium: 3.2 mmol/L — ABNORMAL LOW (ref 3.5–5.1)
Sodium: 134 mmol/L — ABNORMAL LOW (ref 135–145)

## 2020-02-10 LAB — GLUCOSE, CAPILLARY
Glucose-Capillary: 149 mg/dL — ABNORMAL HIGH (ref 70–99)
Glucose-Capillary: 137 mg/dL — ABNORMAL HIGH (ref 70–99)
Glucose-Capillary: 143 mg/dL — ABNORMAL HIGH (ref 70–99)
Glucose-Capillary: 133 mg/dL — ABNORMAL HIGH (ref 70–99)

## 2020-02-10 LAB — ECHOCARDIOGRAM COMPLETE
Height: 71 in
Weight: 4409.2 oz

## 2020-02-10 LAB — HEMOGLOBIN A1C
Hgb A1c MFr Bld: 6.7 % — ABNORMAL HIGH (ref 4.8–5.6)
Mean Plasma Glucose: 145.59 mg/dL

## 2020-02-10 LAB — MAGNESIUM: Magnesium: 1.8 mg/dL (ref 1.7–2.4)

## 2020-02-10 LAB — DIGOXIN LEVEL: Digoxin Level: 0.5 ng/mL — ABNORMAL LOW (ref 0.8–2.0)

## 2020-02-10 LAB — PHOSPHORUS: Phosphorus: 2 mg/dL — ABNORMAL LOW (ref 2.5–4.6)

## 2020-02-10 MED ORDER — POTASSIUM CHLORIDE 10 MEQ/100ML IV SOLN
10.0000 meq | INTRAVENOUS | Status: AC
  Administered 2020-02-10 (×6): 10 meq via INTRAVENOUS
  Filled 2020-02-10 (×6): qty 100

## 2020-02-10 MED ORDER — DIGOXIN 0.25 MG/ML IJ SOLN
0.1250 mg | Freq: Every day | INTRAMUSCULAR | Status: DC
  Administered 2020-02-10 – 2020-02-11 (×2): 0.125 mg via INTRAVENOUS
  Filled 2020-02-10 (×3): qty 0.5

## 2020-02-10 MED ORDER — IOHEXOL 350 MG/ML SOLN
75.0000 mL | Freq: Once | INTRAVENOUS | Status: AC | PRN
  Administered 2020-02-10: 75 mL via INTRAVENOUS

## 2020-02-10 MED ORDER — SODIUM CHLORIDE 0.9% FLUSH: 10.0000 mL | INTRAVENOUS | Status: DC | PRN

## 2020-02-10 MED ORDER — INSULIN ASPART 100 UNIT/ML ~~LOC~~ SOLN
0.0000 [IU] | SUBCUTANEOUS | Status: DC
  Administered 2020-02-10: 2 [IU] via SUBCUTANEOUS
  Administered 2020-02-11: 3 [IU] via SUBCUTANEOUS
  Administered 2020-02-11: 2 [IU] via SUBCUTANEOUS
  Administered 2020-02-11 – 2020-02-12 (×9): 3 [IU] via SUBCUTANEOUS
  Administered 2020-02-13 – 2020-02-14 (×3): 2 [IU] via SUBCUTANEOUS
  Administered 2020-02-14 (×2): 3 [IU] via SUBCUTANEOUS
  Administered 2020-02-14: 2 [IU] via SUBCUTANEOUS

## 2020-02-10 MED ORDER — MAGNESIUM SULFATE 2 GM/50ML IV SOLN
2.0000 g | Freq: Once | INTRAVENOUS | Status: AC
  Administered 2020-02-10: 2 g via INTRAVENOUS
  Filled 2020-02-10: qty 50

## 2020-02-10 MED ORDER — METOPROLOL TARTRATE 5 MG/5ML IV SOLN
5.0000 mg | Freq: Four times a day (QID) | INTRAVENOUS | Status: DC
  Administered 2020-02-10 – 2020-02-11 (×2): 5 mg via INTRAVENOUS
  Filled 2020-02-10 (×2): qty 5

## 2020-02-10 MED ORDER — CHLORHEXIDINE GLUCONATE CLOTH 2 % EX PADS
6.0000 | MEDICATED_PAD | Freq: Every day | CUTANEOUS | Status: DC
  Administered 2020-02-10 – 2020-02-14 (×5): 6 via TOPICAL

## 2020-02-10 MED ORDER — OSMOLITE 1.5 CAL PO LIQD
1000.0000 mL | ORAL | Status: DC
  Administered 2020-02-10 – 2020-02-12 (×2): 1000 mL
  Filled 2020-02-10 (×8): qty 1000

## 2020-02-10 MED ORDER — ORAL CARE MOUTH RINSE
15.0000 mL | Freq: Two times a day (BID) | OROMUCOSAL | Status: DC
  Administered 2020-02-10 – 2020-02-12 (×5): 15 mL via OROMUCOSAL

## 2020-02-10 MED ORDER — PROSOURCE TF PO LIQD
45.0000 mL | Freq: Two times a day (BID) | ORAL | Status: DC
  Administered 2020-02-10 – 2020-02-12 (×5): 45 mL
  Filled 2020-02-10 (×10): qty 45

## 2020-02-10 MED ORDER — SODIUM CHLORIDE 0.9 % IV BOLUS: 500.0000 mL | Freq: Once | INTRAVENOUS | Status: AC

## 2020-02-10 MED ORDER — SODIUM CHLORIDE 0.9% FLUSH
10.0000 mL | Freq: Two times a day (BID) | INTRAVENOUS | Status: DC
  Administered 2020-02-10 – 2020-02-13 (×4): 10 mL

## 2020-02-10 NOTE — Procedures (Signed)
Cortrak  Tube Type:  Cortrak - 43 inches Tube Location:  Left nare Initial Placement:  Stomach Secured by: Bridle Technique Used to Measure Tube Placement:  Documented cm marking at nare/ corner of mouth Cortrak Secured At:  65 cm    Cortrak Tube Team Note:  Consult received to place a Cortrak feeding tube.   X-ray is required, abdominal x-ray has been ordered by the Cortrak team. Please confirm tube placement before using the Cortrak tube.   If the tube becomes dislodged please keep the tube and contact the Cortrak team at [email or web address] ([email or web address]) for replacement.  If after hours and replacement cannot be delayed, place a NG tube and confirm placement with an abdominal x-ray.    Koleen Distance MS, RD, LDN Please refer to [facility] for RD and/or RD on-call/weekend/after hours pager

## 2020-02-10 NOTE — Progress Notes (Signed)
ANTICOAGULATION CONSULT NOTE - Follow Up Consult  Pharmacy Consult for Apixaban  > Enoxaparin Indication: atrial fibrillation and stroke  Allergies  Allergen Reactions  . Penicillins Hives    Patient reports full body hives that required medical treatment when he was in his 30s or 40s. He tolerated cephalosporins.   Roselee Nova [Pregabalin] Swelling  . Levofloxacin Swelling    Patient Measurements: Height: 5\' 11"  (180.3 cm) Weight: 125 kg (275 lb 9.2 oz) IBW/kg (Calculated) : 75.3 Lovenox Dosing Weight: 125 kg  Vital Signs: Temp: 97.9 F (36.6 C) (12/29 0722) Temp Source: Oral (12/29 0722) BP: 127/80 (12/29 0722) Pulse Rate: 121 (12/29 0722)  Labs: Recent Labs    02/08/20 0043 02/09/20 0144 02/10/20 0230  HGB 12.8* 13.8 13.2  HCT 35.7* 39.3 36.7*  PLT 52* 45* 31*  CREATININE 0.79 0.89 0.75    Estimated Creatinine Clearance: 107.4 mL/min (by C-G formula based on SCr of 0.75 mg/dL).  Assessment:   74 yom admitted for Nausea and emphysematous cholecystitis. The patient also presented with Afivb with RVR. Patient was previously on Apixaban prior to admission and rate control medications however stopped the Apixaban secondary to  bleeding.  Was started on IV heparin infusion on 12/18 which was discontinued on the next day 12/19 due to severe gangrenous cholecystitis and risk of bleeding . Transitioned to SQ Heparin for VTE prophylaxis. >> then s/p lap cholecystectomy, cholangiogram on 12/21, On 12/22  Dr. 1/23 discussed with general surgery who has cleared patient for heparin. Pharmacy was consulted on 02/03/20 to restart IV heparin for Atrial fibrillation >then transitioned to Apixaban 5 mg BID on 12/23.    Neuro changes 12/28 and MRI done. Concern for stroke, likely cardioembolic.  Now NPO, last Apixaban dose 12/28 am.   Consulted to transition to Enoxaparin, but platelet count has continued to trend down.  139K on admit 12/18 and down to 31K today.  No bleeding reported.    Goal of Therapy:  Anti-Xa level 0.6-1 units/ml 4hrs after LMWH dose given Monitor platelets by anticoagulation protocol: Yes   Plan:   Per discussion with Dr. 1/19, no Lovenox today; consider starting when platelet count > 50K.  Follow up CBC in am.  Apixaban on hold while NPO.   Follow up swallow evaluation.  Monitor for any bleeding.  SCDs for now.  Cristal Deer, RPh 02/10/2020,10:28 AM

## 2020-02-10 NOTE — Progress Notes (Addendum)
Physical Therapy Re-Evaluation Patient Details Name: Gary Butler MRN: [id number] DOB: April 21, 1945 Today's Date: 02/10/2020   History of Present Illness  Gary Butler is a 74/M chronically ill with history significant of OSA; psoriatic arthritis; morbid obesity (BMI 40); HTN; DM; BPH, chronic diastolic CHF (0000000 echo with preserved EF and grade 2 diastolic dysfunction) presentedwith nausea and anorexia.    -prior admission from 9/27-10/5 with perirectal abscess s/p I&D. he also had new onset afib with RVR and was started on Eliquis and amio drip.Ongoing lingering abdominal symptoms for 6 to 8 months, suspect acute on chronic cholecystitis.  Lap cholecystectomy 12/21. 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  Clinical Impression  Pt with new dx of stroke and is more lethargic than yesterday.  HR better controlled with EOB mobility capping out at mid to low 130s vs yesterday's 150s.  He appears more confused and continues to struggle with severe dizziness (constant when sitting EOB).  We attempted to get him to open his eyes and focus on a target, but he was unable to for more than a few seconds.  Pt positioned in high chair mode and lights on/curtains open.  Pt remains appropriate for SNF level of therapy at discharge.  PT to follow acutely for deficits listed below.      Follow Up Recommendations SNF    Equipment Recommendations  Wheelchair (measurements PT);Wheelchair cushion (measurements PT);Hospital bed;Other (comment) (hoyer lift, air mattress)    Recommendations for Other Services       Precautions / Restrictions Precautions Precautions: Fall Precaution Comments: + dizziness, intermittent double vision, nausea, difficulty swallowing      Mobility  Bed Mobility Overal bed mobility: Needs Assistance Bed Mobility: Rolling;Sidelying to Sit Rolling: Max  assist;+2 for physical assistance Sidelying to sit: +2 for physical assistance;Total assist     Sit to sidelying: Total assist;+2 for physical assistance General bed mobility comments: Two person assist to roll and assist in peri care prior to sitting EOB.  Pt able to follow hand over hand cues to help initiate rolling bil and would help push with leg and pull with arm weakly.  Assist needed at legs and trunk to power up to sitting EOB, pt not assisting much at all with side to sit and sit to side transitions.  Immediate dizziness in sitting that lasted the whole time we were EOB (10 mins).    Transfers                 General transfer comment: at this time will need total lift for OOB to chair.  Ambulation/Gait                Stairs            Wheelchair Mobility    Modified Rankin (Stroke Patients Only) Modified Rankin (Stroke Patients Only) Pre-Morbid Rankin Score: Moderate disability Modified Rankin: Severe disability     Balance Overall balance assessment: Needs assistance Sitting-balance support: Feet supported;Bilateral upper extremity supported Sitting balance-Leahy Scale: Zero Sitting balance - Comments: up to total assist seated EOB.  Pt leaning to the right (however, shape of bed is preferencing him to the R as well).  assist needed to get feet under his COG and get hands in a place he could attempt to support himself, however, hands kept sliding out from under him.  Pt unable to open his eyes and lift  head for more than a few seconds reporting dizziness, spontaneous nystgmus noted with eyes open (vertical in nature).  Unable to attempt targeting yet and immediately requested return to bed.  Distracted him with seated MMT, washing of his back, but still requesting to return to supine as dizziness did not dissipate at all in sitting EOB. Postural control: Posterior lean;Right lateral lean                                   Pertinent  Vitals/Pain Pain Assessment: Faces Faces Pain Scale: Hurts even more Pain Location: generalized today, edematous 4 extremities and everything seems to hurt to move Pain Descriptors / Indicators: Guarding;Grimacing Pain Intervention(s): Limited activity within patient's tolerance;Monitored during session;Repositioned    Home Living Family/patient expects to be discharged to:: Private residence Living Arrangements: Spouse/significant other Available Help at Discharge: Family;Available 24 hours/day Type of Home: House Home Access: Ramped entrance     Home Layout: One level Home Equipment: Walker - 2 wheels;Bedside commode;Cane - single point;Shower seat      Prior Function Level of Independence: Independent with assistive device(s)               Hand Dominance   Dominant Hand: Right    Extremity/Trunk Assessment   Upper Extremity Assessment Upper Extremity Assessment: Defer to OT evaluation    Lower Extremity Assessment Lower Extremity Assessment: RLE deficits/detail;LLE deficits/detail RLE Deficits / Details: bil LEs seem equally weak.  Unable to lift either leg against gravity in supine, unable to lift foot off floor to preform LAQ in sitting, unable to raise knee/hip towards the ceiling in sitting and unable to flex knees without help to scoot up to the [facility] in supine. LLE Deficits / Details: bil LEs seem equally weak.  Unable to lift either leg against gravity in supine, unable to lift foot off floor to preform LAQ in sitting, unable to raise knee/hip towards the ceiling in sitting and unable to flex knees without help to scoot up to the [facility] in supine.    Cervical / Trunk Assessment Cervical / Trunk Assessment: Other exceptions Cervical / Trunk Exceptions: very forward head, weak postural muscles, especially cervical spine  Communication   Communication: No difficulties  Cognition Arousal/Alertness: Lethargic Behavior During Therapy: Flat affect Overall Cognitive  Status: Impaired/Different from baseline Area of Impairment: Orientation;Attention;Memory;Following commands;Awareness;Safety/judgement;Problem solving                 Orientation Level: Disoriented to;Place;Situation Current Attention Level: Focused Memory: Decreased short-term memory Following Commands: Follows one step commands with increased time;Follows one step commands inconsistently Safety/Judgement: Decreased awareness of safety;Decreased awareness of deficits Awareness: Intellectual Problem Solving: Slow processing;Decreased initiation;Difficulty sequencing;Requires verbal cues;Requires tactile cues General Comments: Pt more lethargic today, responsive, but less oriented than yesterday, more confused about his situation, at times responses are garbled or make no sense.      General Comments General comments (skin integrity, edema, etc.): BPs soft but stable and HR lower today max observed low 130s.  O2 sats stable on RA throughout.    Exercises     Assessment/Plan    PT Assessment Patient needs continued PT services  PT Problem List Decreased strength;Decreased activity tolerance;Decreased balance;Decreased mobility;Decreased coordination;Decreased cognition;Decreased knowledge of use of DME;Decreased safety awareness;Decreased knowledge of precautions;Cardiopulmonary status limiting activity;Obesity;Decreased skin integrity;Pain       PT Treatment Interventions DME instruction;Gait training;Stair training;Functional mobility training;Therapeutic exercise;Therapeutic activities;Balance training;Neuromuscular re-education;Cognitive remediation;Patient/family  education;Modalities    PT Goals (Current goals can be found in the Care Plan section)  Acute Rehab PT Goals Patient Stated Goal: to go home PT Goal Formulation: With patient Time For Goal Achievement: 02/24/20 Potential to Achieve Goals: Fair    Frequency Min 3X/week   Barriers to discharge         Co-evaluation               AM-PAC PT "6 Clicks" Mobility  Outcome Measure Help needed turning from your back to your side while in a flat bed without using bedrails?: A Lot Help needed moving from lying on your back to sitting on the side of a flat bed without using bedrails?: Total Help needed moving to and from a bed to a chair (including a wheelchair)?: Total Help needed standing up from a chair using your arms (e.g., wheelchair or bedside chair)?: Total Help needed to walk in hospital room?: Total Help needed climbing 3-5 steps with a railing? : Total 6 Click Score: 7    End of Session   Activity Tolerance: Patient limited by fatigue;Patient limited by pain Patient left: in bed;with call bell/phone within reach Nurse Communication: Mobility status PT Visit Diagnosis: Muscle weakness (generalized) (M62.81);Difficulty in walking, not elsewhere classified (R26.2);Dizziness and giddiness (R42)    Time: [id number] PT Time Calculation (min) (ACUTE ONLY): 35 min   Charges:   PT Evaluation $PT Re-evaluation: 1 Re-eval PT Treatments $Therapeutic Activity: 8-22 mins        Verdene Lennert, PT, DPT  Acute Rehabilitation [phone] pager [phone]) [phone] office

## 2020-02-10 NOTE — Progress Notes (Signed)
OT Cancellation Note  Patient Details Name: Gary Butler MRN: [id number] DOB: [birth year]   Cancelled Treatment:    Reason Eval/Treat Not Completed: Patient at procedure or test/ unavailable .  Patient with bedside IV team.  OT to continue efforts as appropriate.   Kiasha Bellin D Meir Elwood 02/10/2020, 3:54 PM

## 2020-02-10 NOTE — Progress Notes (Signed)
PROGRESS NOTE    Gary Butler  [id number] DOB: 1945-02-14 DOA: 01/30/2020 PCP: [name], MD    Brief Narrative:  74/M chronically illwith history significant ofOSA; psoriatic arthritis; morbid obesity (BMI 40); HTN; DM; BPH, chronic diastolic CHF (0000000 echo with preserved EF and grade 2 diastolic dysfunction) presentedwith nausea and anorexia. Pt was found to have gangrenous cholecystitis and underwent lap chole on 12/21. Course was complicated by worsening thrombocytopenia, afib RVR and later acute CVA  Assessment & Plan:   Principal Problem:   Sepsis due to undetermined organism [facility]) Active Problems:   OSA on CPAP   Hyperlipidemia   BPH (benign prostatic hyperplasia)   Obesity, Class III, BMI 40-49.9 (morbid obesity) ([place])   Essential hypertension   Uncontrolled type 2 diabetes mellitus with hyperglycemia, with long-term current use of insulin (HCC)   Perineal abscess   Chronic diastolic congestive heart failure ([place])   Acute emphysematous cholecystitis  Sepsis Gangrenous/emphysematous cholecystitis -General surgery following -Ongoing lingering symptoms for 6 to 8 months, suspect acute on chronic cholecystitis HIDA scan abnormal. Status post laparoscopic cholecystectomy on 02/02/2020 by Dr. Grandville Silos.  -Pt completed course of IV abx per general surgery.  Surgery has since signed off  Recent perirectal abscess  -With perirectal thickening on CT  A. fib with RVR Chronic diastolic CHF Due to persistent A. fib with RVR early morning of 02/06/2020, cardiology was reconsulted.   -Patient was continued on digoxin and Cardizem.   -Rapid response was called on the evening of 02/07/2020 due to low blood pressure and some lethargy.   -Cardiology started him on IV amiodarone and continued him on oral digoxin and cardizem was stopped.   -HR seems improved today -Was on eliquis, however, anticoagulation currently held given below thrombocytopenia   Type 2 diabetes  mellitus -Jardiance on hold, continue sliding scale insulin.  Blood sugars had been stable thus far  Mild dementia/cognitive deficits: Difficult to assess given acute CVA, however pt does have shor t term memory deficits on exam Abnormal chest x-ray suspecting possible infiltrates: Repeat chest x-ray noted to be unremarkable  Morbid obesity Obstructive sleep apnea -Has lost 100 pounds and needs continued weight loss -Has not been using BiPAP/CPAP following weight loss  Goals of care DO NOT RESUSCITATE -Followed by palliative care, not under hospice  Deconditioning: Seen by PT OT and they recommend SNF.  TOC following  Vertigo: Continue meclizine as recommended by PT.  His symptoms are likely secondary to BPPV.  Pt is followed by PT  Fluid/volume overload: Patient has +2 pitting edema bilateral lower extremity which is likely due to hypoalbuminemia. Cardiology following  Possible peripheral neuropathy: Patient is very tender to touch on both lower extremities.  Likely undiagnosed peripheral neuropathy in a patient who already has diabetes.  Plan to continue with gabapentin at the time of discharge.  Hypomagnesemia/hypokalemia: Potassium low at 3.2l and magnesium 1.8. -Pt was given 44mEq of KCl IV -Recheck bmet in AM  Acute CVA confirmed:  -Recent acute neurologic changes noted -MRI brain reviewed, findings worrisome for acute R centrum semiovale CVA -Neurology consulted and is following -Discussed with Cardiology and Neurology. Given worsening thrombocytopenia, plan to hold anticoagulation until platelets improve  Thrombocytopenia -Uncertain etiology, noted to have gradually trended down since initial visit while pt was on eliquis -Suspect may be related to presenting sepsis -Discussed with Neurology and Cardiology. Given thrombocytopenia, will temporarily hold anticoagulation for now -Repeat CBC in AM, plan continue anticoagulation when plts improve  DVT  prophylaxis: SCD's  Code Status: DNR Family Communication: Pt in room, family not at bedside  Status is: Inpatient  Remains inpatient appropriate because:IV treatments appropriate due to intensity of illness or inability to take PO and Inpatient level of care appropriate due to severity of illness   Dispo: The patient is from: Home              Anticipated d/c is to: SNF              Anticipated d/c date is: > 3 days              Patient currently is not medically stable to d/c.       Consultants:   General Surgery  Cardiology  Neurology  Procedures:   Lap chole  Antimicrobials: Anti-infectives (From admission, onward)   Start     Dose/Rate Route Frequency Ordered Stop   01/31/20 1400  ceFEPIme (MAXIPIME) 2 g in sodium chloride 0.9 % 100 mL IVPB       "And" Linked Group Details   2 g 200 mL/hr over 30 Minutes Intravenous Every 8 hours 01/31/20 1158 02/04/20 2359   01/31/20 1245  metroNIDAZOLE (FLAGYL) IVPB 500 mg       "And" Linked Group Details   500 mg 100 mL/hr over 60 Minutes Intravenous Every 8 hours 01/31/20 1158 02/04/20 2359   01/30/20 1445  ceFEPIme (MAXIPIME) 2 g in sodium chloride 0.9 % 100 mL IVPB       "And" Linked Group Details   2 g 200 mL/hr over 30 Minutes Intravenous  Once 01/30/20 1438 01/30/20 1856   01/30/20 1445  metroNIDAZOLE (FLAGYL) IVPB 500 mg       "And" Linked Group Details   500 mg 100 mL/hr over 60 Minutes Intravenous  Once 01/30/20 1438 01/30/20 1856       Subjective: Pleasantly confused, difficult to assess  Objective: Vitals:   02/10/20 0355 02/10/20 0722 02/10/20 1123 02/10/20 1320  BP: 107/81 127/80 108/69 95/65  Pulse: (!) 129 (!) 121 (!) 112 100  Resp: 20 20 19    Temp: 97.9 F (36.6 C) 97.9 F (36.6 C) 98.5 F (36.9 C)   TempSrc: Oral Oral Oral   SpO2: 94% 98% 97% 98%  Weight: 125 kg     Height:        Intake/Output Summary (Last 24 hours) at 02/10/2020 1641 Last data filed at 02/10/2020 0234 Gross per 24  hour  Intake 177.51 ml  Output --  Net 177.51 ml   Filed Weights   02/08/20 0330 02/09/20 0448 02/10/20 0355  Weight: 125.9 kg 125 kg 125 kg    Examination:  General exam: Appears calm and comfortable  Respiratory system: Clear to auscultation. Respiratory effort normal. Cardiovascular system: S1 & S2 heard, tachycardic Gastrointestinal system: Abdomen is nondistended, soft and nontender. No organomegaly or masses felt. Normal bowel sounds heard. Central nervous system: Alert, no tremors, L sided weakness on exam Extremities: Symmetric 5 x 5 power. Skin: No rashes, lesions  Psychiatry: difficult to assess given mentation   Data Reviewed: I have personally reviewed following labs and imaging studies  CBC: Recent Labs  Lab 02/06/20 1407 02/07/20 0639 02/08/20 0043 02/09/20 0144 02/10/20 0230  WBC 3.9* 3.9* 3.3* 3.9* 3.4*  NEUTROABS 2.9 2.5 2.2 2.8 2.4  HGB 13.4 14.0 12.8* 13.8 13.2  HCT 39.0 40.6 35.7* 39.3 36.7*  MCV 88.0 88.6 86.4 85.6 85.9  PLT 71* 61* 52* 45* 31*   Basic Metabolic Panel: Recent Labs  Lab 02/04/20 0130 02/05/20 1007 02/06/20 1407 02/07/20 0639 02/08/20 0043 02/09/20 0144 02/10/20 0230  NA 138 136 136 133* 135 133* 134*  K 3.1* 4.9 3.0* 3.2* 3.1* 3.8 3.2*  CL 105 108 105 102 101 101 101  CO2 19* 14* 21* 19* 24 18* 21*  GLUCOSE 139* 128* 128* 138* 136* 162* 152*  BUN 8 11 10 9 9 14 11   CREATININE 0.72 0.77 0.80 0.78 0.79 0.89 0.75  CALCIUM 8.3* 8.0* 8.2* 8.1* 7.9* 8.3* 8.1*  MG 1.6* 2.0  --  1.7  --  1.7 1.8   GFR: Estimated Creatinine Clearance: 107.4 mL/min (by C-G formula based on SCr of 0.75 mg/dL). Liver Function Tests: Recent Labs  Lab 02/04/20 0130 02/08/20 0043  AST 21 10*  ALT 15 11  ALKPHOS 49 55  BILITOT 1.0 1.4*  PROT 3.8* 3.6*  ALBUMIN 1.9* 1.8*   No results for input(s): LIPASE, AMYLASE in the last 168 hours. No results for input(s): AMMONIA in the last 168 hours. Coagulation Profile: No results for input(s):  INR, PROTIME in the last 168 hours. Cardiac Enzymes: No results for input(s): CKTOTAL, CKMB, CKMBINDEX, TROPONINI in the last 168 hours. BNP (last 3 results) No results for input(s): PROBNP in the last 8760 hours. HbA1C: Recent Labs    02/10/20 0230  HGBA1C 6.7*   CBG: Recent Labs  Lab 02/09/20 1623 02/09/20 2114 02/10/20 0622 02/10/20 1125 02/10/20 1638  GLUCAP 166* 138* 149* 137* 143*   Lipid Profile: Recent Labs    02/10/20 0230  CHOL 87  HDL 28*  LDLCALC 38  TRIG 106  CHOLHDL 3.1   Thyroid Function Tests: No results for input(s): TSH, T4TOTAL, FREET4, T3FREE, THYROIDAB in the last 72 hours. Anemia Panel: No results for input(s): VITAMINB12, FOLATE, FERRITIN, TIBC, IRON, RETICCTPCT in the last 72 hours. Sepsis Labs: Recent Labs  Lab 02/06/20 1407 02/06/20 1623 02/07/20 0639  PROCALCITON  --   --  <0.10  LATICACIDVEN 1.9 1.7  --     Recent Results (from the past 240 hour(s))  Surgical pcr screen     Status: Abnormal   Collection Time: 02/02/20  6:06 AM   Specimen: Nasal Mucosa; Nasal Swab  Result Value Ref Range Status   MRSA, PCR NEGATIVE NEGATIVE Final   Staphylococcus aureus POSITIVE (A) NEGATIVE Final    Comment: (NOTE) The Xpert SA Assay (FDA approved for NASAL specimens in patients 92 years of age and older), is one component of a comprehensive surveillance program. It is not intended to diagnose infection nor to guide or monitor treatment. Performed at [facility], [place] [address], [place], [place] [address]   Culture, blood (Routine X 2) w Reflex to ID Panel     Status: None (Preliminary result)   Collection Time: 02/06/20  2:07 PM   Specimen: BLOOD  Result Value Ref Range Status   Specimen Description BLOOD RIGHT ANTECUBITAL  Final   Special Requests AEROBIC BOTTLE ONLY Blood Culture adequate volume  Final   Culture   Final    NO GROWTH 4 DAYS Performed at [facility], [place] [address] [place], [place] [address]    Report  Status PENDING  Incomplete  Culture, blood (Routine X 2) w Reflex to ID Panel     Status: None (Preliminary result)   Collection Time: 02/06/20  2:22 PM   Specimen: BLOOD  Result Value Ref Range Status   Specimen Description BLOOD BLOOD RIGHT HAND  Final   Special  Requests   Final    BOTTLES DRAWN AEROBIC AND ANAEROBIC Blood Culture adequate volume   Culture   Final    NO GROWTH 4 DAYS Performed at [facility], [address], [place], [place] [address]    Report Status PENDING  Incomplete     Radiology Studies: CT ANGIO HEAD W OR WO CONTRAST  Result Date: 02/10/2020 CLINICAL DATA:  Left-sided weakness with dizziness and diplopia EXAM: CT ANGIOGRAPHY HEAD AND NECK TECHNIQUE: Multidetector CT imaging of the head and neck was performed using the standard protocol during bolus administration of intravenous contrast. Multiplanar CT image reconstructions and MIPs were obtained to evaluate the vascular anatomy. Carotid stenosis measurements (when applicable) are obtained utilizing NASCET criteria, using the distal internal carotid diameter as the denominator. CONTRAST:  64mL OMNIPAQUE IOHEXOL 350 MG/ML SOLN COMPARISON:  None. FINDINGS: CT HEAD FINDINGS Brain: There is no mass, hemorrhage or extra-axial collection. The size and configuration of the ventricles and extra-axial CSF spaces are normal. There is hypoattenuation of the periventricular white matter, most commonly indicating chronic ischemic microangiopathy. Skull: The visualized skull base, calvarium and extracranial soft tissues are normal. Sinuses/Orbits: No fluid levels or advanced mucosal thickening of the visualized paranasal sinuses. No mastoid or middle ear effusion. The orbits are normal. CTA NECK FINDINGS SKELETON: There is no bony spinal canal stenosis. No lytic or blastic lesion. OTHER NECK: Normal pharynx, larynx and major salivary glands. No cervical lymphadenopathy. Unremarkable thyroid gland. UPPER CHEST: No pneumothorax or  pleural effusion. No nodules or masses. AORTIC ARCH: There is no calcific atherosclerosis of the aortic arch. There is no aneurysm, dissection or hemodynamically significant stenosis of the visualized portion of the aorta. Conventional 3 vessel aortic branching pattern. The visualized proximal subclavian arteries are widely patent. RIGHT CAROTID SYSTEM: Normal without aneurysm, dissection or stenosis. LEFT CAROTID SYSTEM: Normal without aneurysm, dissection or stenosis. VERTEBRAL ARTERIES: Left dominant configuration. Both origins are clearly patent. There is no dissection, occlusion or flow-limiting stenosis to the skull base (V1-V3 segments). CTA HEAD FINDINGS POSTERIOR CIRCULATION: --Vertebral arteries: Normal V4 segments. --Inferior cerebellar arteries: Normal. --Basilar artery: Normal. --Superior cerebellar arteries: Normal. --Posterior cerebral arteries (PCA): Normal. ANTERIOR CIRCULATION: --Intracranial internal carotid arteries: Normal. --Anterior cerebral arteries (ACA): Normal. Both A1 segments are present. Patent anterior communicating artery (a-comm). --Middle cerebral arteries (MCA): Normal. VENOUS SINUSES: As permitted by contrast timing, patent. ANATOMIC VARIANTS: None Review of the MIP images confirms the above findings. IMPRESSION: 1. No emergent large vessel occlusion or hemodynamically significant stenosis of the head or neck. 2. Chronic ischemic microangiopathy. Electronically Signed   By: Deatra Robinson M.D.   On: 02/10/2020 01:30   CT ANGIO NECK W OR WO CONTRAST  Result Date: 02/10/2020 CLINICAL DATA:  Left-sided weakness with dizziness and diplopia EXAM: CT ANGIOGRAPHY HEAD AND NECK TECHNIQUE: Multidetector CT imaging of the head and neck was performed using the standard protocol during bolus administration of intravenous contrast. Multiplanar CT image reconstructions and MIPs were obtained to evaluate the vascular anatomy. Carotid stenosis measurements (when applicable) are obtained  utilizing NASCET criteria, using the distal internal carotid diameter as the denominator. CONTRAST:  62mL OMNIPAQUE IOHEXOL 350 MG/ML SOLN COMPARISON:  None. FINDINGS: CT HEAD FINDINGS Brain: There is no mass, hemorrhage or extra-axial collection. The size and configuration of the ventricles and extra-axial CSF spaces are normal. There is hypoattenuation of the periventricular white matter, most commonly indicating chronic ischemic microangiopathy. Skull: The visualized skull base, calvarium and extracranial soft tissues are normal. Sinuses/Orbits:  No fluid levels or advanced mucosal thickening of the visualized paranasal sinuses. No mastoid or middle ear effusion. The orbits are normal. CTA NECK FINDINGS SKELETON: There is no bony spinal canal stenosis. No lytic or blastic lesion. OTHER NECK: Normal pharynx, larynx and major salivary glands. No cervical lymphadenopathy. Unremarkable thyroid gland. UPPER CHEST: No pneumothorax or pleural effusion. No nodules or masses. AORTIC ARCH: There is no calcific atherosclerosis of the aortic arch. There is no aneurysm, dissection or hemodynamically significant stenosis of the visualized portion of the aorta. Conventional 3 vessel aortic branching pattern. The visualized proximal subclavian arteries are widely patent. RIGHT CAROTID SYSTEM: Normal without aneurysm, dissection or stenosis. LEFT CAROTID SYSTEM: Normal without aneurysm, dissection or stenosis. VERTEBRAL ARTERIES: Left dominant configuration. Both origins are clearly patent. There is no dissection, occlusion or flow-limiting stenosis to the skull base (V1-V3 segments). CTA HEAD FINDINGS POSTERIOR CIRCULATION: --Vertebral arteries: Normal V4 segments. --Inferior cerebellar arteries: Normal. --Basilar artery: Normal. --Superior cerebellar arteries: Normal. --Posterior cerebral arteries (PCA): Normal. ANTERIOR CIRCULATION: --Intracranial internal carotid arteries: Normal. --Anterior cerebral arteries (ACA): Normal.  Both A1 segments are present. Patent anterior communicating artery (a-comm). --Middle cerebral arteries (MCA): Normal. VENOUS SINUSES: As permitted by contrast timing, patent. ANATOMIC VARIANTS: None Review of the MIP images confirms the above findings. IMPRESSION: 1. No emergent large vessel occlusion or hemodynamically significant stenosis of the head or neck. 2. Chronic ischemic microangiopathy. Electronically Signed   By: Ulyses Jarred M.D.   On: 02/10/2020 01:30   MR BRAIN WO CONTRAST  Result Date: 02/09/2020 CLINICAL DATA:  Neuro deficit, acute, stroke suspected EXAM: MRI HEAD WITHOUT CONTRAST TECHNIQUE: Multiplanar, multiecho pulse sequences of the brain and surrounding structures were obtained without intravenous contrast. COMPARISON:  10/29/2019 and prior. FINDINGS: Brain: 2-3 mm right centrum semiovale restricted diffusion. No intracranial hemorrhage. No midline shift, ventriculomegaly or extra-axial fluid collection. No mass lesion. Diffuse parenchymal volume loss with ex vacuo dilatation. Chronic right corona radiata and left cerebellar lacunar insults. Moderate chronic microvascular ischemic changes. Vascular: Normal flow voids. Skull and upper cervical spine: Normal marrow signal. Sinuses/Orbits: Normal orbits. Mild ethmoid sinus mucosal thickening. Minimal bilateral mastoid free fluid. Other: None. 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 Chronic right corona radiata and left cerebellar lacunar insults. Age-related cerebral atrophy and moderate chronic microvascular ischemic changes. These results will be called to the ordering clinician or representative by the Radiologist Assistant, and communication documented in the PACS or [employer]. Electronically Signed   By: Primitivo Gauze M.D.   On: 02/09/2020 14:52   DG Abd Portable 1V  Result Date: 02/10/2020 CLINICAL DATA:  Feeding tube placement. EXAM: PORTABLE ABDOMEN - 1 VIEW COMPARISON:  None. FINDINGS:  The bowel gas pattern is normal. Distal tip of feeding tube is seen in expected position of distal stomach. No radio-opaque calculi or other significant radiographic abnormality are seen. IMPRESSION: Distal tip of feeding tube seen in expected position of distal stomach. Electronically Signed   By: Marijo Conception M.D.   On: 02/10/2020 14:26    Scheduled Meds: . atorvastatin  40 mg Oral Daily  . digoxin  0.125 mg Intravenous Daily  . feeding supplement (PROSource TF)  45 mL Per Tube BID  . insulin aspart  0-15 Units Subcutaneous TID WC  . insulin aspart  0-5 Units Subcutaneous QHS  . mouth rinse  15 mL Mouth Rinse BID  . metoprolol tartrate  5 mg Intravenous Q6H  . nystatin   Topical BID  . sodium chloride  flush  3 mL Intravenous Q12H  . tamsulosin  0.4 mg Oral Daily   Continuous Infusions: . sodium chloride 250 mL (02/02/20 2328)  . amiodarone 30 mg/hr (02/10/20 1021)  . feeding supplement (OSMOLITE 1.5 CAL) 1,000 mL (02/10/20 1636)  . potassium chloride 10 mEq (02/10/20 1608)     LOS: 11 days   Marylu Lund, MD [facility] Pager On Amion  If 7PM-7AM, please contact night-coverage 02/10/2020, 4:41 PM

## 2020-02-10 NOTE — Progress Notes (Signed)
Initial Nutrition Assessment  DOCUMENTATION CODES:   Severe malnutrition in context of chronic illness  INTERVENTION:   Monitor magnesium, potassium, and phosphorus daily for at least 3 days, MD to replete as needed, as pt is at risk for refeeding syndrome.   Tube feeding:  -Osmolite 1.5 @ 20 ml/hr via Cortrak -Increase by 10 ml Q8 hours to goal rate of 65 ml/hr (1560 ml) -ProSource TF 45 ml BID  Provides: 2420 kcals, 120 grams protein, 1189 ml free water.   NUTRITION DIAGNOSIS:   Severe Malnutrition related to chronic illness (cholecystitis/dysphagia) as evidenced by percent weight loss,energy intake < or equal to 75% for > or equal to 1 month.  GOAL:   Patient will meet greater than or equal to 90% of their needs  MONITOR:   Skin,TF tolerance,Weight trends,I & O's,Labs,Diet advancement  REASON FOR ASSESSMENT:   LOS    ASSESSMENT:   Patient with PMH significant for OSA, psoriatic arthritis, HTN, DM, BPH, and CHF. Presents this admission with gangrenous cholecystitis.   12/21- s/p laparoscopic cholecystectomy 12/28- acute ischemic stroke- cardioembolic   Per SLP, patient has prior history of esophageal dysphagia. Now cognition precludes diet advancement. Okay to place Cortrak for alternative means of nutrition per MD.   Patient unable to provide history. Per H&P, pt has not ate in the last two weeks due to nausea/vomiting and reports 8 months of poor intake with post meal emesis. Seems to have had poor appetite this admission prior to stroke with meal documentation as 25%.   Records indicate patient weighed 164.9 kg on 02/19/19 and 125 kg this admission (24% wt loss in one year, significant for time frame).   Admission weight: 131.5 kg  Current weight: 125 kg   Drips: 10 mEq KCl Medications: SS novolog Labs: Na 134 (L) K 3.2 (L) CBG 137-174  NUTRITION - FOCUSED PHYSICAL EXAM:  Flowsheet Row Most Recent Value  Orbital Region No depletion  Upper Arm Region No  depletion  Thoracic and Lumbar Region Unable to assess  Buccal Region No depletion  Temple Region No depletion  Clavicle Bone Region No depletion  Clavicle and Acromion Bone Region No depletion  Scapular Bone Region Unable to assess  Dorsal Hand No depletion  Patellar Region No depletion  Anterior Thigh Region No depletion  Posterior Calf Region No depletion  Edema (RD Assessment) Severe  Hair Reviewed  Eyes Unable to assess  Mouth Unable to assess  Skin Reviewed  Nails Reviewed      Diet Order:   Diet Order            Diet NPO time specified  Diet effective now                 EDUCATION NEEDS:   Not appropriate for education at this time  Skin:  Skin Assessment: Skin Integrity Issues: Skin Integrity Issues:: Stage II,Incisions Stage II: buttocks Incisions: abdomen  Last BM:  12/27  Height:   Ht Readings from Last 1 Encounters:  01/30/20 5\' 11"  (1.803 m)    Weight:   Wt Readings from Last 1 Encounters:  02/10/20 125 kg    BMI:  Body mass index is 38.43 kg/m.  Estimated Nutritional Needs:   Kcal:  2200-2400 kcal  Protein:  115-130 grams  Fluid:  >/= 2 L/day  02/12/20 RD, LDN Clinical Nutrition Pager listed in AMION

## 2020-02-10 NOTE — Evaluation (Signed)
Clinical/Bedside Swallow Evaluation Patient Details  Name: Gary Butler MRN: [id number] Date of Birth: 03-29-1945  Today's Date: 02/10/2020 Time: SLP Start Time (ACUTE ONLY): 0810 SLP Stop Time (ACUTE ONLY): 0825 SLP Time Calculation (min) (ACUTE ONLY): 15 min  Past Medical History:  Past Medical History:  Diagnosis Date  . Arthritis   . CHF (congestive heart failure) (HCC)   . Constipation   . Diabetes mellitus   . Heart murmur   . History of gout   . History of skin cancer   . Hypertension   . Morbid (severe) obesity due to excess calories (HCC)   . OSA (obstructive sleep apnea)   . Psoriatic arthritis (HCC)   . Rectal fissure   . Tinnitus    Past Surgical History:  Past Surgical History:  Procedure Laterality Date  . addenoids    . ANAL FISTULOTOMY  2019   Dr Drue Dun, [place]  . CHOLECYSTECTOMY N/A 02/02/2020   Procedure: LAPAROSCOPIC CHOLECYSTECTOMY;  Surgeon: Violeta Gelinas, MD;  Location: [facility] OR;  Service: General;  Laterality: N/A;  . IRRIGATION AND DEBRIDEMENT ABSCESS N/A 11/10/2019   Procedure: INCISION AND DRAINAGE OF HIGH SCROTAL ABSCESS;  Surgeon: Karie Soda, MD;  Location: [facility] ORS;  Service: General;  Laterality: N/A;  . JOINT REPLACEMENT  2012   RT KNEE  . LUNG BIOPSY     20 YRS AGO  . RECTAL EXAM UNDER ANESTHESIA N/A 11/10/2019   Procedure: ANORECTAL EXAM UNDER ANESTHESIA;  Surgeon: Karie Soda, MD;  Location: [facility] ORS;  Service: General;  Laterality: N/A;  . TONSILLECTOMY    . TOTAL KNEE ARTHROPLASTY Bilateral 06/22/2013   Procedure: LEFT TOTAL KNEE ARTHROPLASTY;  MEDIAL SOFT TISSUE EXPLORATION SAPHENOUS NEURECTOMY RIGHT KNEE;  Surgeon: Shelda Pal, MD;  Location: [facility] ORS;  Service: Orthopedics;  Laterality: Bilateral;   HPI:  Pt is a 74 yo male who presented with nausea and anorexia. Prior admission from 9/27-10/5 with perirectal abscess s/p I&D. He also had new onset afib and was started on Eliquis. Ongoing lingering symptoms for 6 to 8 months, suspect acute  on chronic cholecystitis. S/p cholecystectomy 12/21. While working with PT 12/28 he was noted to have increased dizziness and L sided weakness. 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 PMH includes: OSA; psoriatic arthritis; morbid obesity (BMI 40); HTN; DM; BPH, chronic diastolic CHF (10/2019 echo with preserved EF and grade 2 diastolic dysfunction), difficulty swallowing over the past year (coughing, gagging, bringing up white phlegm) for which he was seen by GI and had an esophagram that revealed tertiary contractions and mild esophageal dysmotility   Assessment / Plan / Recommendation Clinical Impression  Pt with a h/o esophageal dysphagia now presents with more acute difficulties that appear to be more cognitive and/or behavioral in nature. 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 His oral motor exam is unrevealing and he appears to have good oral control with thin liquids via cup/straw as he holds boluses in his mouth and swishes them, ultimately spitting them back out into his cup. Pt refused any solids because he did not want to "regurgitate." Unable to make diet recommendations at this time, but would also have concerns about adequate nutritional intake given current presentation and hx. SLP will f/u for additional PO trials as well as more formal cognitive evaluation. SLP Visit Diagnosis: Dysphagia, oral phase (R13.11)    Aspiration Risk  Risk for inadequate nutrition/hydration    Diet Recommendation NPO   Medication Administration: Via alternative means    Other  Recommendations Oral Care Recommendations: Oral care QID   Follow up Recommendations Skilled Nursing facility      Frequency and Duration min 2x/week  2 weeks       Prognosis Prognosis for Safe Diet Advancement: Good Barriers to Reach Goals: Cognitive deficits      Swallow Study    General HPI: Pt is a 74 yo male who presented with nausea and anorexia. Prior admission from 9/27-10/5 with perirectal abscess s/p I&D. He also had new onset afib and was started on Eliquis. Ongoing lingering symptoms for 6 to 8 months, suspect acute on chronic cholecystitis. S/p cholecystectomy 12/21. While working with PT 12/28 he was noted to have increased dizziness and L sided weakness. 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 PMH includes: OSA; psoriatic arthritis; morbid obesity (BMI 40); HTN; DM; BPH, chronic diastolic CHF (0000000 echo with preserved EF and grade 2 diastolic dysfunction), difficulty swallowing over the past year (coughing, gagging, bringing up white phlegm) for which he was seen by GI and had an esophagram that revealed tertiary contractions and mild esophageal dysmotility Type of Study: Bedside Swallow Evaluation Previous Swallow Assessment: see HPI (no prior notes from SLP) Diet Prior to this Study: NPO Temperature Spikes Noted: No Respiratory Status: Room air History of Recent Intubation: No Behavior/Cognition: Alert;Cooperative;Confused;Requires cueing Oral Cavity Assessment: Within Functional Limits Oral Care Completed by SLP: No Oral Cavity - Dentition: Adequate natural dentition Vision: Functional for self-feeding Self-Feeding Abilities: Able to feed self Patient Positioning: Upright in bed Baseline Vocal Quality: Normal Volitional Cough: Strong Volitional Swallow: Able to elicit    Oral/Motor/Sensory Function Overall Oral Motor/Sensory Function: Within functional limits   Ice Chips Ice chips: Not tested   Thin Liquid Thin Liquid: Impaired Presentation: Cup;Self Fed;Straw Oral Phase Functional Implications: Oral holding;Other (comment) (expectoration) Pharyngeal  Phase Impairments: Unable to trigger swallow    Nectar Thick Nectar Thick Liquid: Not tested   Honey Thick Honey Thick Liquid: Not tested   Puree Puree: Not tested    Solid     Solid: Not tested      Osie Bond., M.A. [place] Pager [phone] Office [phone]  02/10/2020,8:32 AM

## 2020-02-10 NOTE — Progress Notes (Signed)
  Echocardiogram 2D Echocardiogram has been performed.  Leta Jungling M 02/10/2020, 2:58 PM

## 2020-02-10 NOTE — Progress Notes (Signed)
Pt had not had urine out put since 11:00 am yesterday per RN day shift reported. Bladder scan confirmed twice with 0 ml of urine. Notified Dr. Clarisa Fling, on call provider, Order received for 500 ml NSS bolus.   Atrial fib with RVR, HR 110-130, BP stable 107/66 mmHg, afebrile, on room air SPO2 98%. Amiodarone IV drip at 30 mg/hr.  Pt had been NPO, awaiting for SLP evaluation. We had to hold oral med due to swallowing difficulty.    Pressure ulcer stage 2 found under his sacral are. Cleaned and foam applied. Hand off to RN day shift to provide bariatric air mattress for Pt.   We continue to monitor.  Filiberto Pinks, RN

## 2020-02-10 NOTE — Progress Notes (Signed)
Peripherally Inserted Central Catheter Placement  The IV Nurse has discussed with the patient and/or persons authorized to consent for the patient, the purpose of this procedure and the potential benefits and risks involved with this procedure.  The benefits include less needle sticks, lab draws from the catheter, and the patient may be discharged home with the catheter. Risks include, but not limited to, infection, bleeding, blood clot (thrombus formation), and puncture of an artery; nerve damage and irregular heartbeat and possibility to perform a PICC exchange if needed/ordered by physician.  Alternatives to this procedure were also discussed.  Bard Power PICC patient education guide, fact sheet on infection prevention and patient information card has been provided to patient /or left at bedside.    PICC Placement Documentation  PICC Double Lumen 02/10/20 PICC Right Basilic 43 cm 0 cm (Active)  Indication for Insertion or Continuance of Line Vasoactive infusions 02/10/20 1842  Exposed Catheter (cm) 0 cm 02/10/20 1842  Site Assessment Clean;Dry;Intact 02/10/20 1842  Lumen #1 Status Flushed;Blood return noted 02/10/20 1842  Lumen #2 Status Flushed;Blood return noted 02/10/20 1842  Dressing Type Transparent 02/10/20 1842  Dressing Status Clean;Dry;Intact 02/10/20 1842  Antimicrobial disc in place? Yes 02/10/20 1842  Dressing Intervention New dressing;Other (Comment) 02/10/20 1842  Dressing Change Due 03-01-2020 02/10/20 1842   Telephone consent signed by wife    Maximino Greenland 02/10/2020, 6:44 PM

## 2020-02-10 NOTE — Progress Notes (Signed)
Progress Note  Patient Name: Crispin Vogel Date of Encounter: 02/10/2020  [facility] Cardiologist: Nicki Guadalajara, MD  Subjective   Yesterday there was concern for acute neuro changes. MRI done, neurology consulted. 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 Low risk of hemorrhagic transformation, recommended to continue apixaban. He has been made strict NPO, did not receive yesterday evening's dose of apixaban. Has not received any doses of metoprolol yet. Remains on IV amiodarone.  When I spoke with him this AM, he tells me the stroke "never happened" but is aware that he cannot have any food given swallow concerns.   Inpatient Medications    Scheduled Meds: . atorvastatin  40 mg Oral Daily  . digoxin  0.125 mg Intravenous Daily  . feeding supplement (GLUCERNA SHAKE)  237 mL Oral TID BM  . insulin aspart  0-15 Units Subcutaneous TID WC  . insulin aspart  0-5 Units Subcutaneous QHS  . metoprolol tartrate  5 mg Intravenous Q6H  . nystatin   Topical BID  . sodium chloride flush  3 mL Intravenous Q12H  . tamsulosin  0.4 mg Oral Daily   Continuous Infusions: . sodium chloride 250 mL (02/02/20 2328)  . amiodarone 30 mg/hr (02/09/20 2105)  . magnesium sulfate bolus IVPB    . potassium chloride     PRN Meds: sodium chloride, acetaminophen **OR** acetaminophen, meclizine, morphine injection, ondansetron **OR** ondansetron (ZOFRAN) IV, oxyCODONE, promethazine, tiZANidine   Vital Signs    Vitals:   02/10/20 0300 02/10/20 0326 02/10/20 0355 02/10/20 0722  BP:   107/81 127/80  Pulse: (!) 134 (!) 40 (!) 129 (!) 121  Resp: (!) 22 18 20 20   Temp:   97.9 F (36.6 C) 97.9 F (36.6 C)  TempSrc:   Oral Oral  SpO2: 98% 100% 94% 98%  Weight:   125 kg   Height:        Intake/Output Summary (Last 24 hours) at 02/10/2020 1004 Last data filed at 02/10/2020 0234 Gross per 24 hour  Intake 589.79 ml  Output --  Net 589.79 ml   Last 3 Weights  02/10/2020 02/09/2020 02/08/2020  Weight (lbs) 275 lb 9.2 oz 275 lb 9.2 oz 277 lb 9 oz  Weight (kg) 125 kg 125 kg 125.9 kg      Telemetry    Atrial fibrillation, rates largely 110s-130s - Personally Reviewed  ECG    No new since 12/25 - Personally Reviewed  Physical Exam   GEN: Well nourished, well developed in no acute distress HEENT: Normal, moist mucous membranes NECK: No JVD appreciated CARDIAC: tachycardic, irregularly irregular rhythm, normal S1 and S2, no rubs or gallops. No murmur. VASCULAR: Radial and DP pulses 2+ bilaterally. No carotid bruits RESPIRATORY:  Distant but clear to auscultation without rales, wheezing or rhonchi  ABDOMEN: Soft, non-tender, non-distended MUSCULOSKELETAL:  Ambulates independently SKIN: Warm and dry, no edema NEURO: defer to formal neuro exam, but he is moving all limbs independently, following commands, no gross cranial nerve deficiencies PSYCHIATRIC:  Normal affect, conversational but not historically accurate with recent events.  Labs    High Sensitivity Troponin:   Recent Labs  Lab 01/30/20 1246 01/30/20 1640  TROPONINIHS 28* 29*      Chemistry Recent Labs  Lab 02/04/20 0130 02/05/20 1007 02/08/20 0043 02/09/20 0144 02/10/20 0230  NA 138   < > 135 133* 134*  K 3.1*   < > 3.1* 3.8 3.2*  CL 105   < > 101 101 101  CO2 19*   < > 24 18* 21*  GLUCOSE 139*   < > 136* 162* 152*  BUN 8   < > 9 14 11   CREATININE 0.72   < > 0.79 0.89 0.75  CALCIUM 8.3*   < > 7.9* 8.3* 8.1*  PROT 3.8*  --  3.6*  --   --   ALBUMIN 1.9*  --  1.8*  --   --   AST 21  --  10*  --   --   ALT 15  --  11  --   --   ALKPHOS 49  --  55  --   --   BILITOT 1.0  --  1.4*  --   --   GFRNONAA >60   < > >60 >60 >60  ANIONGAP 14   < > 10 14 12    < > = values in this interval not displayed.     Hematology Recent Labs  Lab 02/08/20 0043 02/09/20 0144 02/10/20 0230  WBC 3.3* 3.9* 3.4*  RBC 4.13* 4.59 4.27  HGB 12.8* 13.8 13.2  HCT 35.7* 39.3 36.7*   MCV 86.4 85.6 85.9  MCH 31.0 30.1 30.9  MCHC 35.9 35.1 36.0  RDW 15.8* 15.9* 16.1*  PLT 52* 45* 31*    BNP Recent Labs  Lab 02/06/20 1407  BNP 218.4*     DDimer No results for input(s): DDIMER in the last 168 hours.   Radiology    CT ANGIO HEAD W OR WO CONTRAST  Result Date: 02/10/2020 CLINICAL DATA:  Left-sided weakness with dizziness and diplopia EXAM: CT ANGIOGRAPHY HEAD AND NECK TECHNIQUE: Multidetector CT imaging of the head and neck was performed using the standard protocol during bolus administration of intravenous contrast. Multiplanar CT image reconstructions and MIPs were obtained to evaluate the vascular anatomy. Carotid stenosis measurements (when applicable) are obtained utilizing NASCET criteria, using the distal internal carotid diameter as the denominator. CONTRAST:  40mL OMNIPAQUE IOHEXOL 350 MG/ML SOLN COMPARISON:  None. FINDINGS: CT HEAD FINDINGS Brain: There is no mass, hemorrhage or extra-axial collection. The size and configuration of the ventricles and extra-axial CSF spaces are normal. There is hypoattenuation of the periventricular white matter, most commonly indicating chronic ischemic microangiopathy. Skull: The visualized skull base, calvarium and extracranial soft tissues are normal. Sinuses/Orbits: No fluid levels or advanced mucosal thickening of the visualized paranasal sinuses. No mastoid or middle ear effusion. The orbits are normal. CTA NECK FINDINGS SKELETON: There is no bony spinal canal stenosis. No lytic or blastic lesion. OTHER NECK: Normal pharynx, larynx and major salivary glands. No cervical lymphadenopathy. Unremarkable thyroid gland. UPPER CHEST: No pneumothorax or pleural effusion. No nodules or masses. AORTIC ARCH: There is no calcific atherosclerosis of the aortic arch. There is no aneurysm, dissection or hemodynamically significant stenosis of the visualized portion of the aorta. Conventional 3 vessel aortic branching pattern. The visualized  proximal subclavian arteries are widely patent. RIGHT CAROTID SYSTEM: Normal without aneurysm, dissection or stenosis. LEFT CAROTID SYSTEM: Normal without aneurysm, dissection or stenosis. VERTEBRAL ARTERIES: Left dominant configuration. Both origins are clearly patent. There is no dissection, occlusion or flow-limiting stenosis to the skull base (V1-V3 segments). CTA HEAD FINDINGS POSTERIOR CIRCULATION: --Vertebral arteries: Normal V4 segments. --Inferior cerebellar arteries: Normal. --Basilar artery: Normal. --Superior cerebellar arteries: Normal. --Posterior cerebral arteries (PCA): Normal. ANTERIOR CIRCULATION: --Intracranial internal carotid arteries: Normal. --Anterior cerebral arteries (ACA): Normal. Both A1 segments are present. Patent anterior communicating artery (a-comm). --Middle cerebral arteries (MCA): Normal. VENOUS SINUSES: As  permitted by contrast timing, patent. ANATOMIC VARIANTS: None Review of the MIP images confirms the above findings. IMPRESSION: 1. No emergent large vessel occlusion or hemodynamically significant stenosis of the head or neck. 2. Chronic ischemic microangiopathy. Electronically Signed   By: Ulyses Jarred M.D.   On: 02/10/2020 01:30   CT ANGIO NECK W OR WO CONTRAST  Result Date: 02/10/2020 CLINICAL DATA:  Left-sided weakness with dizziness and diplopia EXAM: CT ANGIOGRAPHY HEAD AND NECK TECHNIQUE: Multidetector CT imaging of the head and neck was performed using the standard protocol during bolus administration of intravenous contrast. Multiplanar CT image reconstructions and MIPs were obtained to evaluate the vascular anatomy. Carotid stenosis measurements (when applicable) are obtained utilizing NASCET criteria, using the distal internal carotid diameter as the denominator. CONTRAST:  56mL OMNIPAQUE IOHEXOL 350 MG/ML SOLN COMPARISON:  None. FINDINGS: CT HEAD FINDINGS Brain: There is no mass, hemorrhage or extra-axial collection. The size and configuration of the  ventricles and extra-axial CSF spaces are normal. There is hypoattenuation of the periventricular white matter, most commonly indicating chronic ischemic microangiopathy. Skull: The visualized skull base, calvarium and extracranial soft tissues are normal. Sinuses/Orbits: No fluid levels or advanced mucosal thickening of the visualized paranasal sinuses. No mastoid or middle ear effusion. The orbits are normal. CTA NECK FINDINGS SKELETON: There is no bony spinal canal stenosis. No lytic or blastic lesion. OTHER NECK: Normal pharynx, larynx and major salivary glands. No cervical lymphadenopathy. Unremarkable thyroid gland. UPPER CHEST: No pneumothorax or pleural effusion. No nodules or masses. AORTIC ARCH: There is no calcific atherosclerosis of the aortic arch. There is no aneurysm, dissection or hemodynamically significant stenosis of the visualized portion of the aorta. Conventional 3 vessel aortic branching pattern. The visualized proximal subclavian arteries are widely patent. RIGHT CAROTID SYSTEM: Normal without aneurysm, dissection or stenosis. LEFT CAROTID SYSTEM: Normal without aneurysm, dissection or stenosis. VERTEBRAL ARTERIES: Left dominant configuration. Both origins are clearly patent. There is no dissection, occlusion or flow-limiting stenosis to the skull base (V1-V3 segments). CTA HEAD FINDINGS POSTERIOR CIRCULATION: --Vertebral arteries: Normal V4 segments. --Inferior cerebellar arteries: Normal. --Basilar artery: Normal. --Superior cerebellar arteries: Normal. --Posterior cerebral arteries (PCA): Normal. ANTERIOR CIRCULATION: --Intracranial internal carotid arteries: Normal. --Anterior cerebral arteries (ACA): Normal. Both A1 segments are present. Patent anterior communicating artery (a-comm). --Middle cerebral arteries (MCA): Normal. VENOUS SINUSES: As permitted by contrast timing, patent. ANATOMIC VARIANTS: None Review of the MIP images confirms the above findings. IMPRESSION: 1. No emergent  large vessel occlusion or hemodynamically significant stenosis of the head or neck. 2. Chronic ischemic microangiopathy. Electronically Signed   By: Ulyses Jarred M.D.   On: 02/10/2020 01:30   MR BRAIN WO CONTRAST  Result Date: 02/09/2020 CLINICAL DATA:  Neuro deficit, acute, stroke suspected EXAM: MRI HEAD WITHOUT CONTRAST TECHNIQUE: Multiplanar, multiecho pulse sequences of the brain and surrounding structures were obtained without intravenous contrast. COMPARISON:  10/29/2019 and prior. FINDINGS: Brain: 2-3 mm right centrum semiovale restricted diffusion. No intracranial hemorrhage. No midline shift, ventriculomegaly or extra-axial fluid collection. No mass lesion. Diffuse parenchymal volume loss with ex vacuo dilatation. Chronic right corona radiata and left cerebellar lacunar insults. Moderate chronic microvascular ischemic changes. Vascular: Normal flow voids. Skull and upper cervical spine: Normal marrow signal. Sinuses/Orbits: Normal orbits. Mild ethmoid sinus mucosal thickening. Minimal bilateral mastoid free fluid. Other: None. 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 Chronic right corona radiata and left cerebellar lacunar insults. Age-related cerebral atrophy and moderate chronic microvascular ischemic changes. These results will be called to  the ordering clinician or representative by the Radiologist Assistant, and communication documented in the PACS or [employer]. Electronically Signed   By: Primitivo Gauze M.D.   On: 02/09/2020 14:52    Cardiac Studies   Limited echo 02/07/20 1. Very limited study quality, LVEF determined by [employer] echocontrast  and appears normal 55-60%, right ventricle is not visualized.  TTE 11/11/2019 1. Left ventricular ejection fraction, by estimation, is 60 to 65%. The  left ventricle has normal function. The left ventricle has no regional  wall motion abnormalities. Left ventricular diastolic parameters are  consistent  with Grade II diastolic  dysfunction (pseudonormalization).  2. Right ventricular systolic function is normal. The right ventricular  size is normal.  3. The mitral valve is normal in structure. No evidence of mitral valve  regurgitation. No evidence of mitral stenosis.  4. The aortic valve is normal in structure. Aortic valve regurgitation is  not visualized. No aortic stenosis is present.  5. The inferior vena cava is normal in size with greater than 50%  respiratory variability, suggesting right atrial pressure of 3 mmHg.   Patient Profile     74 y.o. male with diabetes, hypertension, A. fib, HFpEF, obesity who was admitted on 01/30/2020 for nausea/vomiting and found to have gangrenous cholecystitis.  Cardiology initially consulted for preoperative assessment.  Cardiology was reconsulted on 02/06/2020 for A. fib with RVR and low blood pressure.  Assessment & Plan    A. fib with RVR Hypotension -Digoxin loaded this admission.  Continue 0.125 mg daily, changed to IV today given strict NPO. Digoxin level acceptable today -was on bisoprolol as an outpatient. Started on IV diltiazem 02/06/20 for RVR, but even oral diltiazem worsened hypotension -started IV amiodarone 12/27 with improvements in rates/BP. Not yet at goal. Did not receive metoprolol as he was changed to strict NPO. With improved BP, will trial IV metoprolol for effect, but as this is very short acting IV, do not expect that it will offer long term rate control. Restart oral metoprolol when able. -was on Eliquis 5 mg twice daily. Now with strict NPO, consulted pharmacy for lovenox until he can take PO again. However, platelets have been drifting down, 31 today. Unclear to me the etiology of this thrombocytopenia. With recent stroke, needs to be on anticoagulation, but low platelets increase risk of bleeding. Will discuss with primary team, but suspect that we will need platelets >50k to safely anticoagulate.  Volume  overloaded Elevated BNP/low albumin Acute on chronic diastolic heart failure -EF preserved on limited echo -albumin 1.8-1.9, likely contributing to third spacing/edema -admission weight 118.4 kg, today's weight 125 kg. I/O inaccurate. Now with strict NPO, need to monitor his nutritional status/intake closely given his hypoalbuminemia  Hypokalemia: -K 3.2, as he is strict NPO will replete with 10 mEq x6 of IV potassium  Hypomagnesemia -Mg 1.8 today, will give 2 gm IV  Type II diabetes -given risk of volume overload, would recommend alternative to pioglitazone as an outpatient -continue Jardiance as an outpatient -insulin while inpatient per primary team  His clinical condition with recent stroke has worsened, needs close monitoring. Multiple issues include acute stroke, afib RVR, thrombocytopenia, electrolyte abnormalities. Complex medical decision making.  For questions or updates, please contact [place] Please consult [email or web address] for contact info under     Signed, Buford Dresser, MD  02/10/2020, 10:04 AM

## 2020-02-10 NOTE — Progress Notes (Addendum)
STROKE TEAM PROGRESS NOTE   INTERVAL HISTORY RN at the bedside. Pt lying in bed, not in acute distress, but mildly lethargic. Still has afib. Not fully orientated but no focal neuro deficit. Platelet continue to decreased, hold off eliquis for now.   Vitals:   02/10/20 0300 02/10/20 0326 02/10/20 0355 02/10/20 0722  BP:   107/81 127/80  Pulse: (!) 134 (!) 40 (!) 129 (!) 121  Resp: (!) 22 18 20 20   Temp:   97.9 F (36.6 C) 97.9 F (36.6 C)  TempSrc:   Oral Oral  SpO2: 98% 100% 94% 98%  Weight:   125 kg   Height:       CBC:  Recent Labs  Lab 02/09/20 0144 02/10/20 0230  WBC 3.9* 3.4*  NEUTROABS 2.8 2.4  HGB 13.8 13.2  HCT 39.3 36.7*  MCV 85.6 85.9  PLT 45* 31*   Basic Metabolic Panel:  Recent Labs  Lab 02/09/20 0144 02/10/20 0230  NA 133* 134*  K 3.8 3.2*  CL 101 101  CO2 18* 21*  GLUCOSE 162* 152*  BUN 14 11  CREATININE 0.89 0.75  CALCIUM 8.3* 8.1*  MG 1.7 1.8   Lipid Panel:  Recent Labs  Lab 02/10/20 0230  CHOL 87  TRIG 106  HDL 28*  CHOLHDL 3.1  VLDL 21  LDLCALC 38   HgbA1c:  Recent Labs  Lab 02/10/20 0230  HGBA1C 6.7*   Urine Drug Screen: No results for input(s): LABOPIA, COCAINSCRNUR, LABBENZ, AMPHETMU, THCU, LABBARB in the last 168 hours.  Alcohol Level No results for input(s): ETH in the last 168 hours.  IMAGING past 24 hours CT ANGIO HEAD W OR WO CONTRAST  Result Date: 02/10/2020 CLINICAL DATA:  Left-sided weakness with dizziness and diplopia EXAM: CT ANGIOGRAPHY HEAD AND NECK TECHNIQUE: Multidetector CT imaging of the head and neck was performed using the standard protocol during bolus administration of intravenous contrast. Multiplanar CT image reconstructions and MIPs were obtained to evaluate the vascular anatomy. Carotid stenosis measurements (when applicable) are obtained utilizing NASCET criteria, using the distal internal carotid diameter as the denominator. CONTRAST:  39mL OMNIPAQUE IOHEXOL 350 MG/ML SOLN COMPARISON:  None. FINDINGS:  CT HEAD FINDINGS Brain: There is no mass, hemorrhage or extra-axial collection. The size and configuration of the ventricles and extra-axial CSF spaces are normal. There is hypoattenuation of the periventricular white matter, most commonly indicating chronic ischemic microangiopathy. Skull: The visualized skull base, calvarium and extracranial soft tissues are normal. Sinuses/Orbits: No fluid levels or advanced mucosal thickening of the visualized paranasal sinuses. No mastoid or middle ear effusion. The orbits are normal. CTA NECK FINDINGS SKELETON: There is no bony spinal canal stenosis. No lytic or blastic lesion. OTHER NECK: Normal pharynx, larynx and major salivary glands. No cervical lymphadenopathy. Unremarkable thyroid gland. UPPER CHEST: No pneumothorax or pleural effusion. No nodules or masses. AORTIC ARCH: There is no calcific atherosclerosis of the aortic arch. There is no aneurysm, dissection or hemodynamically significant stenosis of the visualized portion of the aorta. Conventional 3 vessel aortic branching pattern. The visualized proximal subclavian arteries are widely patent. RIGHT CAROTID SYSTEM: Normal without aneurysm, dissection or stenosis. LEFT CAROTID SYSTEM: Normal without aneurysm, dissection or stenosis. VERTEBRAL ARTERIES: Left dominant configuration. Both origins are clearly patent. There is no dissection, occlusion or flow-limiting stenosis to the skull base (V1-V3 segments). CTA HEAD FINDINGS POSTERIOR CIRCULATION: --Vertebral arteries: Normal V4 segments. --Inferior cerebellar arteries: Normal. --Basilar artery: Normal. --Superior cerebellar arteries: Normal. --Posterior cerebral arteries (PCA): Normal. ANTERIOR  CIRCULATION: --Intracranial internal carotid arteries: Normal. --Anterior cerebral arteries (ACA): Normal. Both A1 segments are present. Patent anterior communicating artery (a-comm). --Middle cerebral arteries (MCA): Normal. VENOUS SINUSES: As permitted by contrast timing,  patent. ANATOMIC VARIANTS: None Review of the MIP images confirms the above findings. IMPRESSION: 1. No emergent large vessel occlusion or hemodynamically significant stenosis of the head or neck. 2. Chronic ischemic microangiopathy. Electronically Signed   By: Ulyses Jarred M.D.   On: 02/10/2020 01:30   CT ANGIO NECK W OR WO CONTRAST  Result Date: 02/10/2020 CLINICAL DATA:  Left-sided weakness with dizziness and diplopia EXAM: CT ANGIOGRAPHY HEAD AND NECK TECHNIQUE: Multidetector CT imaging of the head and neck was performed using the standard protocol during bolus administration of intravenous contrast. Multiplanar CT image reconstructions and MIPs were obtained to evaluate the vascular anatomy. Carotid stenosis measurements (when applicable) are obtained utilizing NASCET criteria, using the distal internal carotid diameter as the denominator. CONTRAST:  39mL OMNIPAQUE IOHEXOL 350 MG/ML SOLN COMPARISON:  None. FINDINGS: CT HEAD FINDINGS Brain: There is no mass, hemorrhage or extra-axial collection. The size and configuration of the ventricles and extra-axial CSF spaces are normal. There is hypoattenuation of the periventricular white matter, most commonly indicating chronic ischemic microangiopathy. Skull: The visualized skull base, calvarium and extracranial soft tissues are normal. Sinuses/Orbits: No fluid levels or advanced mucosal thickening of the visualized paranasal sinuses. No mastoid or middle ear effusion. The orbits are normal. CTA NECK FINDINGS SKELETON: There is no bony spinal canal stenosis. No lytic or blastic lesion. OTHER NECK: Normal pharynx, larynx and major salivary glands. No cervical lymphadenopathy. Unremarkable thyroid gland. UPPER CHEST: No pneumothorax or pleural effusion. No nodules or masses. AORTIC ARCH: There is no calcific atherosclerosis of the aortic arch. There is no aneurysm, dissection or hemodynamically significant stenosis of the visualized portion of the aorta.  Conventional 3 vessel aortic branching pattern. The visualized proximal subclavian arteries are widely patent. RIGHT CAROTID SYSTEM: Normal without aneurysm, dissection or stenosis. LEFT CAROTID SYSTEM: Normal without aneurysm, dissection or stenosis. VERTEBRAL ARTERIES: Left dominant configuration. Both origins are clearly patent. There is no dissection, occlusion or flow-limiting stenosis to the skull base (V1-V3 segments). CTA HEAD FINDINGS POSTERIOR CIRCULATION: --Vertebral arteries: Normal V4 segments. --Inferior cerebellar arteries: Normal. --Basilar artery: Normal. --Superior cerebellar arteries: Normal. --Posterior cerebral arteries (PCA): Normal. ANTERIOR CIRCULATION: --Intracranial internal carotid arteries: Normal. --Anterior cerebral arteries (ACA): Normal. Both A1 segments are present. Patent anterior communicating artery (a-comm). --Middle cerebral arteries (MCA): Normal. VENOUS SINUSES: As permitted by contrast timing, patent. ANATOMIC VARIANTS: None Review of the MIP images confirms the above findings. IMPRESSION: 1. No emergent large vessel occlusion or hemodynamically significant stenosis of the head or neck. 2. Chronic ischemic microangiopathy. Electronically Signed   By: Ulyses Jarred M.D.   On: 02/10/2020 01:30   MR BRAIN WO CONTRAST  Result Date: 02/09/2020 CLINICAL DATA:  Neuro deficit, acute, stroke suspected EXAM: MRI HEAD WITHOUT CONTRAST TECHNIQUE: Multiplanar, multiecho pulse sequences of the brain and surrounding structures were obtained without intravenous contrast. COMPARISON:  10/29/2019 and prior. FINDINGS: Brain: 2-3 mm right centrum semiovale restricted diffusion. No intracranial hemorrhage. No midline shift, ventriculomegaly or extra-axial fluid collection. No mass lesion. Diffuse parenchymal volume loss with ex vacuo dilatation. Chronic right corona radiata and left cerebellar lacunar insults. Moderate chronic microvascular ischemic changes. Vascular: Normal flow voids.  Skull and upper cervical spine: Normal marrow signal. Sinuses/Orbits: Normal orbits. Mild ethmoid sinus mucosal thickening. Minimal bilateral mastoid free fluid. Other: None. IMPRESSION: 2-3  mm right centrum semiovale acute/subacute infarct. Chronic right corona radiata and left cerebellar lacunar insults. Age-related cerebral atrophy and moderate chronic microvascular ischemic changes. These results will be called to the ordering clinician or representative by the Radiologist Assistant, and communication documented in the PACS or [employer]. Electronically Signed   By: Stana Bunting M.D.   On: 02/09/2020 14:52    PHYSICAL EXAM  Temp:  [97.9 F (36.6 C)-98.7 F (37.1 C)] 98.5 F (36.9 C) (12/29 1123) Pulse Rate:  [40-139] 100 (12/29 1320) Resp:  [18-23] 19 (12/29 1123) BP: (95-127)/(64-84) 95/65 (12/29 1320) SpO2:  [93 %-100 %] 98 % (12/29 1320) Weight:  [629 kg] 125 kg (12/29 0355)  General - Well nourished, well developed, in no apparent distress, mildly lethargic.  Ophthalmologic - fundi not visualized due to noncooperation.  Cardiovascular - irregularly irregular heart rate and rhythm.  Neuro - lethargic but awake alert eyes open, orientated to month and age but not to place and year or situation. Paucity of speech but no aphasia, naming 3/4, able to repeat, follows simple commands. PERRL, b/l gaze but incomplete, visual field full. Facial symmetrical, tongue midline. BUE 4/5 and BLE proximal 1/5, knee flexion 2/5, ankle DF/PF 4/5. Sensation symmetrica, FTN slow but grossly intact. Gait not tested.   ASSESSMENT/PLAN Gary Butler is a 74 y.o. male with history of  sleep apnea, psoriatic arthritis, morbid obesity, hypertension, diabetes, chronic diastolic CHF  initially presented 01/30/2020 with nausea and anorexia, noted to have gangrenous/emphysematous cholecystitis which was managed surgically followed by IV antibiotics, recent perirectal abscess, A. fib with RVR, history  of vestibular pathology with chronic dizziness with increasing dizziness, left-sided weakness and left upper extremity incoordination 02/09/2020 while working with PT. MRI showed a punctate 2 to 3 mm right centrum semiovale acute/subacute infarct.   Stroke: punctate R centrum semiovale infarct likely embolic secondary to known AF on Eliquis vs. small vessel disease  MRI  R centrum semiovale punctate infarct. Chronic R corona radiata and L cerebellar lacunes. Small vessel disease. Atrophy.   CTA head & neck no ELVO. Chronic ischemic microangiopathy  2D Echo EF 55-60%. No source of embolus  LDL 38  HgbA1c 6.7  VTE prophylaxis - Eliquis  No antithrombotic prior to admission (previous Eliquis stopped d/t GIB), resumed Eliquis (apixaban) daily in hospital but now on hold due to NPO and thrombocytopenia. Timing to restart eliquis per primary team.    Therapy recommendations:  SNF  Disposition:  pending   Atrial Fibrillation w/ RVR  Dx in Sept 2021  Home anticoagulation:  Eliquis (apixaban) daily - on hold, then stopped d/t rectal bleeding PTA . Resumed Eliquis (apixaban) daily in hospital but now on hold due to NPO and thrombocytopenia. . Timing to restart eliquis per primary team.     Hypertension  Stable on the low end . Avoid low BP . Long-term BP goal normotensive  Hyperlipidemia  Home meds:  lipitor 40, resumed in hospital  LDL 38, goal < 70  Continue statin at discharge  Diabetes type II Controlled  HgbA1c 6.7, goal < 7.0  CBGs  SSI  PCP follow up  Dysphagia . Secondary to stroke . NPO . Speech on board  Thrombocytopenia   Platelet 116->96->81->71->52->45->31  ? Related to sepsis  Hold off anticoagulation now due to increased risk of bleeding  Close monitoring   Sepsis  Gangrenous/emphysematous cholecystitis s/p Sx and abx  Perirectal abscess  Could be etiology for thrombocytopenia and tachycardia  Off Abx now  WBC  3.9->3.4  Dysphagic    On TF @ 39  S/p cortrak placement  NPO for now  Speech on board  Other Stroke Risk Factors  Advanced Age >/= 58   Obesity, Body mass index is 38.43 kg/m., BMI >/= 30 associated with increased stroke risk, recommend weight loss, diet and exercise as appropriate   Obstructive sleep apnea  Chronic diastolic Congestive heart failure  Other Active Problems  Mild dementia/cognitive dysfunction on aricpt PTA - Dr. Delice Lesch at [facility]  Deconditioned   Possible peripheral neuropathy  Hypomagnesemia/Hypokalemia  Sacral pressure ulcer  Hospital day # 11  Neurology will sign off. Please call with questions. Pt will follow up with Dr. Delice Lesch at [facility] in about 4 weeks. Thanks for the consult.  Rosalin Hawking, MD PhD Stroke Neurology 02/10/2020 10:26 PM    To contact Stroke Continuity provider, please refer to [email or web address] After hours, contact General Neurology

## 2020-02-11 DIAGNOSIS — Z7189 Other specified counseling: Secondary | ICD-10-CM

## 2020-02-11 DIAGNOSIS — I6340 Cerebral infarction due to embolism of unspecified cerebral artery: Secondary | ICD-10-CM

## 2020-02-11 DIAGNOSIS — Z515 Encounter for palliative care: Secondary | ICD-10-CM

## 2020-02-11 DIAGNOSIS — R400 Somnolence: Secondary | ICD-10-CM | POA: Diagnosis not present

## 2020-02-11 DIAGNOSIS — I4891 Unspecified atrial fibrillation: Secondary | ICD-10-CM | POA: Diagnosis not present

## 2020-02-11 DIAGNOSIS — K810 Acute cholecystitis: Secondary | ICD-10-CM | POA: Diagnosis not present

## 2020-02-11 DIAGNOSIS — I959 Hypotension, unspecified: Secondary | ICD-10-CM | POA: Diagnosis not present

## 2020-02-11 DIAGNOSIS — A419 Sepsis, unspecified organism: Secondary | ICD-10-CM | POA: Diagnosis not present

## 2020-02-11 DIAGNOSIS — Z66 Do not resuscitate: Secondary | ICD-10-CM

## 2020-02-11 DIAGNOSIS — I5032 Chronic diastolic (congestive) heart failure: Secondary | ICD-10-CM | POA: Diagnosis not present

## 2020-02-11 LAB — CBC WITH DIFFERENTIAL/PLATELET
Abs Immature Granulocytes: 0.01 10*3/uL (ref 0.00–0.07)
Basophils Absolute: 0 10*3/uL (ref 0.0–0.1)
Basophils Relative: 1 %
Eosinophils Absolute: 0.1 10*3/uL (ref 0.0–0.5)
Eosinophils Relative: 3 %
HCT: 34.9 % — ABNORMAL LOW (ref 39.0–52.0)
Hemoglobin: 12.7 g/dL — ABNORMAL LOW (ref 13.0–17.0)
Immature Granulocytes: 0 %
Lymphocytes Relative: 24 %
Lymphs Abs: 0.8 10*3/uL (ref 0.7–4.0)
MCH: 31.2 pg (ref 26.0–34.0)
MCHC: 36.4 g/dL — ABNORMAL HIGH (ref 30.0–36.0)
MCV: 85.7 fL (ref 80.0–100.0)
Monocytes Absolute: 0.1 10*3/uL (ref 0.1–1.0)
Monocytes Relative: 2 %
Neutro Abs: 2.5 10*3/uL (ref 1.7–7.7)
Neutrophils Relative %: 70 %
Platelets: 29 10*3/uL — CL (ref 150–400)
RBC: 4.07 MIL/uL — ABNORMAL LOW (ref 4.22–5.81)
RDW: 15.9 % — ABNORMAL HIGH (ref 11.5–15.5)
WBC: 3.6 10*3/uL — ABNORMAL LOW (ref 4.0–10.5)
nRBC: 0 % (ref 0.0–0.2)

## 2020-02-11 LAB — GLUCOSE, CAPILLARY
Glucose-Capillary: 150 mg/dL — ABNORMAL HIGH (ref 70–99)
Glucose-Capillary: 162 mg/dL — ABNORMAL HIGH (ref 70–99)
Glucose-Capillary: 163 mg/dL — ABNORMAL HIGH (ref 70–99)
Glucose-Capillary: 165 mg/dL — ABNORMAL HIGH (ref 70–99)
Glucose-Capillary: 165 mg/dL — ABNORMAL HIGH (ref 70–99)
Glucose-Capillary: 186 mg/dL — ABNORMAL HIGH (ref 70–99)

## 2020-02-11 LAB — DIGOXIN LEVEL: Digoxin Level: 5.4 ng/mL (ref 0.8–2.0)

## 2020-02-11 LAB — PHOSPHORUS
Phosphorus: 2.1 mg/dL — ABNORMAL LOW (ref 2.5–4.6)
Phosphorus: 2 mg/dL — ABNORMAL LOW (ref 2.5–4.6)

## 2020-02-11 LAB — COMPREHENSIVE METABOLIC PANEL
ALT: 11 U/L (ref 0–44)
AST: 9 U/L — ABNORMAL LOW (ref 15–41)
Albumin: 1.8 g/dL — ABNORMAL LOW (ref 3.5–5.0)
Alkaline Phosphatase: 62 U/L (ref 38–126)
Anion gap: 9 (ref 5–15)
BUN: 12 mg/dL (ref 8–23)
CO2: 23 mmol/L (ref 22–32)
Calcium: 8.4 mg/dL — ABNORMAL LOW (ref 8.9–10.3)
Chloride: 102 mmol/L (ref 98–111)
Creatinine, Ser: 0.62 mg/dL (ref 0.61–1.24)
GFR, Estimated: >60 mL/min (ref >60)
Glucose, Bld: 171 mg/dL — ABNORMAL HIGH (ref 70–99)
Potassium: 3.3 mmol/L — ABNORMAL LOW (ref 3.5–5.1)
Sodium: 134 mmol/L — ABNORMAL LOW (ref 135–145)
Total Bilirubin: 0.7 mg/dL (ref 0.3–1.2)
Total Protein: 3.8 g/dL — ABNORMAL LOW (ref 6.5–8.1)

## 2020-02-11 LAB — CULTURE, BLOOD (ROUTINE X 2)
Culture: NO GROWTH
Culture: NO GROWTH
Special Requests: ADEQUATE
Special Requests: ADEQUATE

## 2020-02-11 LAB — DIC (DISSEMINATED INTRAVASCULAR COAGULATION)PANEL
D-Dimer, Quant: 1.37 ug/mL-FEU — ABNORMAL HIGH (ref 0.00–0.50)
Fibrinogen: 262 mg/dL (ref 210–475)
INR: 1.3 — ABNORMAL HIGH (ref 0.8–1.2)
Platelets: 21 10*3/uL — CL (ref 150–400)
Prothrombin Time: 15.3 seconds — ABNORMAL HIGH (ref 11.4–15.2)
Smear Review: NONE SEEN
aPTT: 38 seconds — ABNORMAL HIGH (ref 24–36)

## 2020-02-11 LAB — MAGNESIUM: Magnesium: 1.8 mg/dL (ref 1.7–2.4)

## 2020-02-11 LAB — PROCALCITONIN: Procalcitonin: <0.1 ng/mL

## 2020-02-11 LAB — LACTIC ACID, PLASMA: Lactic Acid, Venous: 0.9 mmol/L (ref 0.5–1.9)

## 2020-02-11 MED ORDER — SODIUM CHLORIDE 0.9 % IV BOLUS
500.0000 mL | Freq: Once | INTRAVENOUS | Status: AC
  Administered 2020-02-11: 500 mL via INTRAVENOUS

## 2020-02-11 MED ORDER — SODIUM CHLORIDE 0.9 % IV BOLUS
1000.0000 mL | Freq: Once | INTRAVENOUS | Status: AC
  Administered 2020-02-11: 1000 mL via INTRAVENOUS

## 2020-02-11 MED ORDER — GLUCAGON HCL RDNA (DIAGNOSTIC) 1 MG IJ SOLR: 1.0000 mg | Freq: Once | INTRAMUSCULAR | Status: DC

## 2020-02-11 MED ORDER — POTASSIUM PHOSPHATES 15 MMOLE/5ML IV SOLN
30.0000 mmol | Freq: Once | INTRAVENOUS | Status: AC
  Administered 2020-02-11: 30 mmol via INTRAVENOUS
  Filled 2020-02-11: qty 10

## 2020-02-11 MED ORDER — ALBUMIN HUMAN 5 % IV SOLN
12.5000 g | Freq: Four times a day (QID) | INTRAVENOUS | Status: AC
  Administered 2020-02-11 – 2020-02-12 (×4): 12.5 g via INTRAVENOUS
  Filled 2020-02-11 (×4): qty 250

## 2020-02-11 MED ORDER — POTASSIUM CHLORIDE 10 MEQ/100ML IV SOLN
10.0000 meq | INTRAVENOUS | Status: DC
Start: 2020-02-11 — End: 2020-02-11
  Administered 2020-02-11 (×3): 10 meq via INTRAVENOUS
  Filled 2020-02-11 (×6): qty 100

## 2020-02-11 MED ORDER — MAGNESIUM SULFATE 2 GM/50ML IV SOLN
2.0000 g | Freq: Once | INTRAVENOUS | Status: AC
  Administered 2020-02-11: 2 g via INTRAVENOUS
  Filled 2020-02-11: qty 50

## 2020-02-11 MED ORDER — ALBUMIN HUMAN 25 % IV SOLN
25.0000 g | Freq: Once | INTRAVENOUS | Status: AC
  Administered 2020-02-11: 25 g via INTRAVENOUS
  Filled 2020-02-11: qty 100

## 2020-02-11 MED ORDER — MIDODRINE HCL 5 MG PO TABS
10.0000 mg | ORAL_TABLET | Freq: Three times a day (TID) | ORAL | Status: DC
  Administered 2020-02-11 – 2020-02-14 (×8): 10 mg
  Filled 2020-02-11 (×9): qty 2

## 2020-02-11 NOTE — Progress Notes (Signed)
PROGRESS NOTE    Gary Butler  [id number] DOB: 09-06-1945 DOA: 01/30/2020 PCP: [name], MD    Brief Narrative:  74/M chronically illwith history significant ofOSA; psoriatic arthritis; morbid obesity (BMI 40); HTN; DM; BPH, chronic diastolic CHF (0000000 echo with preserved EF and grade 2 diastolic dysfunction) presentedwith nausea and anorexia. Pt was found to have gangrenous cholecystitis and underwent lap chole on 12/21. Course was complicated by worsening thrombocytopenia, afib RVR and later acute CVA  Assessment & Plan:   Principal Problem:   Sepsis due to undetermined organism [facility]) Active Problems:   OSA on CPAP   Hyperlipidemia   BPH (benign prostatic hyperplasia)   Obesity, Class III, BMI 40-49.9 (morbid obesity) ([place])   Essential hypertension   Uncontrolled type 2 diabetes mellitus with hyperglycemia, with long-term current use of insulin (HCC)   Perineal abscess   Chronic diastolic congestive heart failure (HCC)   Acute emphysematous cholecystitis   Cerebral embolism with cerebral infarction   Protein-calorie malnutrition, severe   Pressure injury of skin  Sepsis Gangrenous/emphysematous cholecystitis -General surgery following -Ongoing lingering symptoms for 6 to 8 months, suspect acute on chronic cholecystitis HIDA scan abnormal. Status post laparoscopic cholecystectomy on 02/02/2020 by Dr. Grandville Silos.  -Pt completed course of IV abx per general surgery.  Surgery has since signed off  Recent perirectal abscess  -With perirectal thickening on CT  A. fib with RVR Chronic diastolic CHF Due to persistent A. fib with RVR early morning of 02/06/2020, cardiology was reconsulted.   -Patient was continued on digoxin and Cardizem.   -Rapid response was called on the evening of 02/07/2020 due to low blood pressure and some lethargy.   -Cardiology started him on IV amiodarone and continued him on digoxin and cardizem was stopped.   -HR remains suboptimally  controlled -Was on eliquis, however, anticoagulation now held given below thrombocytopenia   Type 2 diabetes mellitus -Jardiance on hold, patient is continued on SSI coverage  Mild dementia/cognitive deficits: Difficult to assess given acute CVA, however pt does have short term memory deficits on exam, seems more drowsy today  Abnormal chest x-ray suspecting possible infiltrates: Earlier repeat chest x-ray noted to be unremarkable  Morbid obesity Obstructive sleep apnea -Has lost 100 pounds and needs continued weight loss -Has not been using BiPAP/CPAP following weight loss  Goals of care DO NOT RESUSCITATE -Rapid decline noted since surgery -Patient's HR remains poorly controlled and now is hemodynamically unstable despite IVF boluses -Palliative Care consulted with family meeting planned for 12/31 -Pt is DNR  Deconditioning: Seen by PT OT, earlier recommending SNF, however likely focus on palliation if pt continues to decline  Vertigo: Continue meclizine as recommended by PT.  His symptoms are likely secondary to BPPV.   Fluid/volume overload: Patient has +2 pitting edema bilateral lower extremity which is likely due to hypoalbuminemia. Cardiology had been following. Given multiple doses of IV albumin  Possible peripheral neuropathy: Patient is very tender to touch on both lower extremities.  Likely undiagnosed peripheral neuropathy in a patient who already has diabetes.  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 Given worsening thrombocytopenia, plan to hold anticoagulation until platelets improve however plts cont to trend down  Thrombocytopenia -Uncertain etiology, noted to have  gradually trended down since initial visit while pt  was on eliquis -Suspect may be related to presenting sepsis -Discussed with Neurology and Cardiology. Given thrombocytopenia, anticoagulation remains on hold  DVT prophylaxis: SCD's Code Status: DNR Family Communication: Pt in room, family is currently at bedside  Status is: Inpatient  Remains inpatient appropriate because:Hemodynamically unstable, Unsafe d/c plan, IV treatments appropriate due to intensity of illness or inability to take PO and Inpatient level of care appropriate due to severity of illness   Dispo: The patient is from: Home              Anticipated d/c is to: Unclear at this time              Anticipated d/c date is: > 3 days              Patient currently is not medically stable to d/c.       Consultants:   General Surgery  Cardiology  Neurology  PCCM  Procedures:   Lap chole  Antimicrobials: Anti-infectives (From admission, onward)   Start     Dose/Rate Route Frequency Ordered Stop   01/31/20 1400  ceFEPIme (MAXIPIME) 2 g in sodium chloride 0.9 % 100 mL IVPB       "And" Linked Group Details   2 g 200 mL/hr over 30 Minutes Intravenous Every 8 hours 01/31/20 1158 02/04/20 2359   01/31/20 1245  metroNIDAZOLE (FLAGYL) IVPB 500 mg       "And" Linked Group Details   500 mg 100 mL/hr over 60 Minutes Intravenous Every 8 hours 01/31/20 1158 02/04/20 2359   01/30/20 1445  ceFEPIme (MAXIPIME) 2 g in sodium chloride 0.9 % 100 mL IVPB       "And" Linked Group Details   2 g 200 mL/hr over 30 Minutes Intravenous  Once 01/30/20 1438 01/30/20 1856   01/30/20 1445  metroNIDAZOLE (FLAGYL) IVPB 500 mg       "And" Linked Group Details   500 mg 100 mL/hr over 60 Minutes Intravenous  Once 01/30/20 1438 01/30/20 1856      Subjective: More lethargic this AM, difficult to assess  Objective: Vitals:   02/11/20 1200 02/11/20 1206 02/11/20 1400 02/11/20 1600  BP: (!) 76/48 (!) 82/55 (!) 86/61 90/61  Pulse:  (!) 106 (!) 105    Resp: 18 14 15 17   Temp:      TempSrc:      SpO2: 100% 98%    Weight:      Height:        Intake/Output Summary (Last 24 hours) at 02/11/2020 1709 Last data filed at 02/11/2020 1421 Gross per 24 hour  Intake 1445.64 ml  Output --  Net 1445.64 ml   Filed Weights   02/09/20 0448 02/10/20 0355 02/11/20 0420  Weight: 125 kg 125 kg 127.6 kg    Examination: General exam: Asleep, arousable, laying in bed, in nad Respiratory system: Normal respiratory effort, no wheezing Cardiovascular system: regular rate, s1, s2 Gastrointestinal system: Soft, nondistended, positive BS Central nervous system: CN2-12 grossly intact, strength intact Extremities: Perfused, no clubbing, edematous Skin: Normal skin turgor, no notable skin lesions seen Psychiatry: unable to assess given mentation  Data Reviewed: I have personally reviewed following labs and imaging studies  CBC: Recent Labs  Lab 02/07/20 0639 02/08/20 0043 02/09/20 0144 02/10/20 0230 02/11/20 0834 02/11/20 1525  WBC 3.9* 3.3* 3.9* 3.4* 3.6*  --   NEUTROABS 2.5 2.2 2.8 2.4 2.5  --   HGB 14.0 12.8* 13.8 13.2 12.7*  --   HCT  40.6 35.7* 39.3 36.7* 34.9*  --   MCV 88.6 86.4 85.6 85.9 85.7  --   PLT 61* 52* 45* 31* 29* 21*   Basic Metabolic Panel: Recent Labs  Lab 02/05/20 1007 02/06/20 1407 02/07/20 0639 02/08/20 0043 02/09/20 0144 02/10/20 0230 02/10/20 2200 02/11/20 0555 02/11/20 1511  NA 136   < > 133* 135 133* 134*  --  134*  --   K 4.9   < > 3.2* 3.1* 3.8 3.2*  --  3.3*  --   CL 108   < > 102 101 101 101  --  102  --   CO2 14*   < > 19* 24 18* 21*  --  23  --   GLUCOSE 128*   < > 138* 136* 162* 152*  --  171*  --   BUN 11   < > 9 9 14 11   --  12  --   CREATININE 0.77   < > 0.78 0.79 0.89 0.75  --  0.62  --   CALCIUM 8.0*   < > 8.1* 7.9* 8.3* 8.1*  --  8.4*  --   MG 2.0  --  1.7  --  1.7 1.8  --  1.8  --   PHOS  --   --   --   --   --   --  2.0* 2.1* 2.0*   < > = values in this interval not  displayed.   GFR: Estimated Creatinine Clearance: 108.6 mL/min (by C-G formula based on SCr of 0.62 mg/dL). Liver Function Tests: Recent Labs  Lab 02/08/20 0043 02/11/20 0555  AST 10* 9*  ALT 11 11  ALKPHOS 55 62  BILITOT 1.4* 0.7  PROT 3.6* 3.8*  ALBUMIN 1.8* 1.8*   No results for input(s): LIPASE, AMYLASE in the last 168 hours. No results for input(s): AMMONIA in the last 168 hours. Coagulation Profile: Recent Labs  Lab 02/11/20 1525  INR 1.3*   Cardiac Enzymes: No results for input(s): CKTOTAL, CKMB, CKMBINDEX, TROPONINI in the last 168 hours. BNP (last 3 results) No results for input(s): PROBNP in the last 8760 hours. HbA1C: Recent Labs    02/10/20 0230  HGBA1C 6.7*   CBG: Recent Labs  Lab 02/11/20 0026 02/11/20 0425 02/11/20 0822 02/11/20 1157 02/11/20 1608  GLUCAP 162* 163* 165* 150* 165*   Lipid Profile: Recent Labs    02/10/20 0230  CHOL 87  HDL 28*  LDLCALC 38  TRIG 02/12/20  CHOLHDL 3.1   Thyroid Function Tests: No results for input(s): TSH, T4TOTAL, FREET4, T3FREE, THYROIDAB in the last 72 hours. Anemia Panel: No results for input(s): VITAMINB12, FOLATE, FERRITIN, TIBC, IRON, RETICCTPCT in the last 72 hours. Sepsis Labs: Recent Labs  Lab 02/06/20 1407 02/06/20 1623 02/07/20 0639 02/11/20 1511 02/11/20 1525  PROCALCITON  --   --  <0.10 <0.10  --   LATICACIDVEN 1.9 1.7  --   --  0.9    Recent Results (from the past 240 hour(s))  Surgical pcr screen     Status: Abnormal   Collection Time: 02/02/20  6:06 AM   Specimen: Nasal Mucosa; Nasal Swab  Result Value Ref Range Status   MRSA, PCR NEGATIVE NEGATIVE Final   Staphylococcus aureus POSITIVE (A) NEGATIVE Final    Comment: (NOTE) The Xpert SA Assay (FDA approved for NASAL specimens in patients 16 years of age and older), is one component of a comprehensive surveillance program. It is not intended to diagnose infection nor to  guide or monitor treatment. Performed at [facility], [place] [address], [place], [place] [address]   Culture, blood (Routine X 2) w Reflex to ID Panel     Status: None   Collection Time: 02/06/20  2:07 PM   Specimen: BLOOD  Result Value Ref Range Status   Specimen Description BLOOD RIGHT ANTECUBITAL  Final   Special Requests AEROBIC BOTTLE ONLY Blood Culture adequate volume  Final   Culture   Final    NO GROWTH 5 DAYS Performed at [facility], [place] [address], [place], [place] [address]    Report Status 02/11/2020 FINAL  Final  Culture, blood (Routine X 2) w Reflex to ID Panel     Status: None   Collection Time: 02/06/20  2:22 PM   Specimen: BLOOD  Result Value Ref Range Status   Specimen Description BLOOD BLOOD RIGHT HAND  Final   Special Requests   Final    BOTTLES DRAWN AEROBIC AND ANAEROBIC Blood Culture adequate volume   Culture   Final    NO GROWTH 5 DAYS Performed at [facility], [place] [address] [place], [place] [address]    Report Status 02/11/2020 FINAL  Final     Radiology Studies: CT ANGIO HEAD W OR WO CONTRAST  Result Date: 02/10/2020 CLINICAL DATA:  Left-sided weakness with dizziness and diplopia EXAM: CT ANGIOGRAPHY HEAD AND NECK TECHNIQUE: Multidetector CT imaging of the head and neck was performed using the standard protocol during bolus administration of intravenous contrast. Multiplanar CT image reconstructions and MIPs were obtained to evaluate the vascular anatomy. Carotid stenosis measurements (when applicable) are obtained utilizing NASCET criteria, using the distal internal carotid diameter as the denominator. CONTRAST:  29mL OMNIPAQUE IOHEXOL 350 MG/ML SOLN COMPARISON:  None. FINDINGS: CT HEAD FINDINGS Brain: There is no mass, hemorrhage or extra-axial collection. The size and configuration of the ventricles and extra-axial CSF spaces are normal. There is hypoattenuation of the periventricular white matter, most commonly indicating chronic ischemic microangiopathy. Skull: The visualized skull base,  calvarium and extracranial soft tissues are normal. Sinuses/Orbits: No fluid levels or advanced mucosal thickening of the visualized paranasal sinuses. No mastoid or middle ear effusion. The orbits are normal. CTA NECK FINDINGS SKELETON: There is no bony spinal canal stenosis. No lytic or blastic lesion. OTHER NECK: Normal pharynx, larynx and major salivary glands. No cervical lymphadenopathy. Unremarkable thyroid gland. UPPER CHEST: No pneumothorax or pleural effusion. No nodules or masses. AORTIC ARCH: There is no calcific atherosclerosis of the aortic arch. There is no aneurysm, dissection or hemodynamically significant stenosis of the visualized portion of the aorta. Conventional 3 vessel aortic branching pattern. The visualized proximal subclavian arteries are widely patent. RIGHT CAROTID SYSTEM: Normal without aneurysm, dissection or stenosis. LEFT CAROTID SYSTEM: Normal without aneurysm, dissection or stenosis. VERTEBRAL ARTERIES: Left dominant configuration. Both origins are clearly patent. There is no dissection, occlusion or flow-limiting stenosis to the skull base (V1-V3 segments). CTA HEAD FINDINGS POSTERIOR CIRCULATION: --Vertebral arteries: Normal V4 segments. --Inferior cerebellar arteries: Normal. --Basilar artery: Normal. --Superior cerebellar arteries: Normal. --Posterior cerebral arteries (PCA): Normal. ANTERIOR CIRCULATION: --Intracranial internal carotid arteries: Normal. --Anterior cerebral arteries (ACA): Normal. Both A1 segments are present. Patent anterior communicating artery (a-comm). --Middle cerebral arteries (MCA): Normal. VENOUS SINUSES: As permitted by contrast timing, patent. ANATOMIC VARIANTS: None Review of the MIP images confirms the above findings. IMPRESSION: 1. No emergent large vessel occlusion or hemodynamically significant stenosis of the head or neck. 2. Chronic ischemic microangiopathy.  Electronically Signed   By: Ulyses Jarred M.D.   On: 02/10/2020 01:30   CT ANGIO NECK  W OR WO CONTRAST  Result Date: 02/10/2020 CLINICAL DATA:  Left-sided weakness with dizziness and diplopia EXAM: CT ANGIOGRAPHY HEAD AND NECK TECHNIQUE: Multidetector CT imaging of the head and neck was performed using the standard protocol during bolus administration of intravenous contrast. Multiplanar CT image reconstructions and MIPs were obtained to evaluate the vascular anatomy. Carotid stenosis measurements (when applicable) are obtained utilizing NASCET criteria, using the distal internal carotid diameter as the denominator. CONTRAST:  24mL OMNIPAQUE IOHEXOL 350 MG/ML SOLN COMPARISON:  None. FINDINGS: CT HEAD FINDINGS Brain: There is no mass, hemorrhage or extra-axial collection. The size and configuration of the ventricles and extra-axial CSF spaces are normal. There is hypoattenuation of the periventricular white matter, most commonly indicating chronic ischemic microangiopathy. Skull: The visualized skull base, calvarium and extracranial soft tissues are normal. Sinuses/Orbits: No fluid levels or advanced mucosal thickening of the visualized paranasal sinuses. No mastoid or middle ear effusion. The orbits are normal. CTA NECK FINDINGS SKELETON: There is no bony spinal canal stenosis. No lytic or blastic lesion. OTHER NECK: Normal pharynx, larynx and major salivary glands. No cervical lymphadenopathy. Unremarkable thyroid gland. UPPER CHEST: No pneumothorax or pleural effusion. No nodules or masses. AORTIC ARCH: There is no calcific atherosclerosis of the aortic arch. There is no aneurysm, dissection or hemodynamically significant stenosis of the visualized portion of the aorta. Conventional 3 vessel aortic branching pattern. The visualized proximal subclavian arteries are widely patent. RIGHT CAROTID SYSTEM: Normal without aneurysm, dissection or stenosis. LEFT CAROTID SYSTEM: Normal without aneurysm, dissection or stenosis. VERTEBRAL ARTERIES: Left dominant configuration. Both origins are clearly  patent. There is no dissection, occlusion or flow-limiting stenosis to the skull base (V1-V3 segments). CTA HEAD FINDINGS POSTERIOR CIRCULATION: --Vertebral arteries: Normal V4 segments. --Inferior cerebellar arteries: Normal. --Basilar artery: Normal. --Superior cerebellar arteries: Normal. --Posterior cerebral arteries (PCA): Normal. ANTERIOR CIRCULATION: --Intracranial internal carotid arteries: Normal. --Anterior cerebral arteries (ACA): Normal. Both A1 segments are present. Patent anterior communicating artery (a-comm). --Middle cerebral arteries (MCA): Normal. VENOUS SINUSES: As permitted by contrast timing, patent. ANATOMIC VARIANTS: None Review of the MIP images confirms the above findings. IMPRESSION: 1. No emergent large vessel occlusion or hemodynamically significant stenosis of the head or neck. 2. Chronic ischemic microangiopathy. Electronically Signed   By: Ulyses Jarred M.D.   On: 02/10/2020 01:30   DG Chest Port 1 View  Result Date: 02/10/2020 CLINICAL DATA:  Chest pain EXAM: PORTABLE CHEST 1 VIEW COMPARISON:  None. FINDINGS: The heart size and mediastinal contours are within normal limits. Tip of the OG tube is seen off the distal esophagus. A right-sided PICC seen with the tip in the superior cavoatrial junction. Small bilateral pleural effusions are seen. The visualized skeletal structures are unremarkable. IMPRESSION: Tip the NG tube seen within the distal esophagus. Small bilateral pleural effusions. Electronically Signed   By: Prudencio Pair M.D.   On: 02/10/2020 19:16   DG Abd Portable 1V  Result Date: 02/10/2020 CLINICAL DATA:  Feeding tube placement. EXAM: PORTABLE ABDOMEN - 1 VIEW COMPARISON:  None. FINDINGS: The bowel gas pattern is normal. Distal tip of feeding tube is seen in expected position of distal stomach. No radio-opaque calculi or other significant radiographic abnormality are seen. IMPRESSION: Distal tip of feeding tube seen in expected position of distal stomach.  Electronically Signed   By: Marijo Conception M.D.   On: 02/10/2020 14:26   ECHOCARDIOGRAM  COMPLETE  Result Date: 02/10/2020    ECHOCARDIOGRAM REPORT   Patient Name:   MUNASAR CONKEY  Date of Exam: 02/10/2020 Medical Rec #:  [id number]  Height:       71.0 in Accession #:    [id number] Weight:       275.6 lb Date of Birth:  08/22/1945  BSA:          2.416 m Patient Age:    66 years   BP:           108/69 mmHg Patient Gender: M          HR:           115 bpm. Exam Location:  Inpatient Procedure: 2D Echo Indications:    Stroke 434.91 / I163.9  History:        Patient has prior history of Echocardiogram examinations, most                 recent 02/07/2020. CHF, Arrythmias:Atrial Fibrillation; Risk                 Factors:Diabetes, Hypertension and Sleep Apnea.  Sonographer:    Darlina Sicilian RDCS Referring Phys: [id number] ASHISH ARORA  Sonographer Comments: Technically challenging study due to limited acoustic windows, suboptimal parasternal window, suboptimal apical window and suboptimal subcostal window. Patient declined the use of definity. IMPRESSIONS Extremely limited study, poor visualization of LV/RV and patient declined contrast. If concern for embolic source of CVA, recommend TEE for further evaluation FINDINGS  Left Ventricle: Left ventricular ejection fraction is not well visualized. The left ventricle has not well visualized function. Left ventricular endocardial border not optimally defined to evaluate regional wall motion. The left ventricular internal cavity size was not well visualized. There is not well visualized left ventricular hypertrophy. Left ventricular diastolic parameters are indeterminate. Right Ventricle: The right ventricular size is not well visualized. Right vetricular wall thickness was not well visualized. Right ventricular systolic function was not well visualized. Left Atrium: Left atrial size was not well visualized. Right Atrium: Right atrial size was not well visualized. Pericardium: The  pericardium was not well visualized. Mitral Valve: The mitral valve was not well visualized. Not well visualized mitral valve regurgitation. Tricuspid Valve: The tricuspid valve is not well visualized. Tricuspid valve regurgitation not well visualized. Aortic Valve: The aortic valve was not well visualized. Aortic valve regurgitation not well visualized. Pulmonic Valve: The pulmonic valve was not well visualized. Pulmonic valve regurgitation not well visualized. Aorta: The aortic root was not well visualized. IAS/Shunts: The interatrial septum was not well visualized. Oswaldo Milian MD Electronically signed by Oswaldo Milian MD Signature Date/Time: 02/10/2020/7:21:33 PM    Final    [place] EKG SITE RITE  Result Date: 02/10/2020 If Site Rite image not attached, placement could not be confirmed due to current cardiac rhythm.   Scheduled Meds: . atorvastatin  40 mg Oral Daily  . Chlorhexidine Gluconate Cloth  6 each Topical Daily  . digoxin  0.125 mg Intravenous Daily  . feeding supplement (PROSource TF)  45 mL Per Tube BID  . insulin aspart  0-15 Units Subcutaneous Q4H  . mouth rinse  15 mL Mouth Rinse BID  . midodrine  10 mg Per Tube TID WC  . nystatin   Topical BID  . sodium chloride flush  10-40 mL Intracatheter Q12H  . sodium chloride flush  3 mL Intravenous Q12H  . tamsulosin  0.4 mg Oral Daily   Continuous Infusions: . sodium chloride 250 mL (02/02/20  2328)  . albumin human    . amiodarone 30 mg/hr (02/11/20 0918)  . feeding supplement (OSMOLITE 1.5 CAL) 40 mL/hr at 02/11/20 1400  . potassium PHOSPHATE IVPB (in mmol) 30 mmol (02/11/20 1606)     LOS: 12 days   Marylu Lund, MD [facility] Pager On Amion  If 7PM-7AM, please contact night-coverage 02/11/2020, 5:09 PM

## 2020-02-11 NOTE — Consult Note (Signed)
Consultation Note Date: 02/11/2020   Patient Name: Gary Butler  DOB: Jul 04, 1945  MRN: [id number]  Age / Sex: 74 y.o., male  PCP: [name], MD Referring Physician: Donne Hazel, MD  Reason for Consultation: Establishing goals of care  HPI/Patient Profile: 74 y.o. male  with past medical history of mild dementia, OSA, psoriatic arthritis, morbid obesity (BMI 40), HTN, DM, BPH, and chronic diastolic CHF admitted on 87/68/1157 with nausea and anorexia. Found to have sepsis r/t gangrenous cholecystitis and underwent lap chole 12/21. Course complicated with worsening thrombocytopenia, a fib RVR, and then acute CVA.  PMT consulted to discuss [place].  Clinical Assessment and Goals of Care: I have reviewed medical records including EPIC notes, labs and imaging, received report from RN and Dr. Wyline Copas, assessed the patient and then met with patient's daughter Janett Billow to discuss diagnosis prognosis, [place], EOL wishes, disposition and options.  I introduced Palliative Medicine as specialized medical care for people living with serious illness. It focuses on providing relief from the symptoms and stress of a serious illness. The goal is to improve quality of life for both the patient and the family.  Janett Billow shares that patient's wife is Media planner - she is currently at home. I offer to Janett Billow a family meeting for [place] all to discuss goals of care together. She tells me patient's son will be available tomorrow and asks if we can plan on a meeting at that time.    We discussed patient's current illness and what it means in the larger context of patient's on-going co-morbidities.  Janett Billow has just spoken with Dr. Wyline Copas and has good understanding of situation.   We discuss possibility of move to ICU for pressors, closer monitoring. Janett Billow feels patient would be okay with this if needed.   We discuss patient's code status. Discuss that he had previously  elected dnr. Discussed appropriateness understanding evidenced based poor outcomes in similar hospitalized patients, as the cause of the arrest is likely associated with chronic/terminal disease rather than a reversible acute cardio-pulmonary event. She agrees and understands.   Janett Billow has my contact information and will call me later with meeting time tomorrow.   Questions and concerns were addressed. The family was encouraged to call with questions or concerns.   Primary Decision Maker NEXT OF KIN - spouse Jori Thrall  SUMMARY OF RECOMMENDATIONS   - family meeting to include daughter, son, and wife scheduled for tomorrow - for now, DNR but okay with move to ICU for pressors if needed  Code Status/Advance Care Planning:  DNR  Prognosis:   Unable to determine - at risk for acute decline  Discharge Planning: To Be Determined      Primary Diagnoses: Present on Admission: . Acute emphysematous cholecystitis . Sepsis due to undetermined organism ([place]) . Hyperlipidemia . BPH (benign prostatic hyperplasia) . Obesity, Class III, BMI 40-49.9 (morbid obesity) ([place]) . Essential hypertension . Perineal abscess . Chronic diastolic congestive heart failure ([place])   I have reviewed the medical record, interviewed the patient and family, and examined the patient. The following aspects are pertinent.  Past Medical History:  Diagnosis Date  . Arthritis   . CHF (congestive heart failure) ([place])   . Constipation   . Diabetes mellitus   . Heart murmur   . History of gout   . History of skin cancer   . Hypertension   . Morbid (severe) obesity due to excess calories ([place])   . OSA (obstructive sleep apnea)   .  Psoriatic arthritis ([place])   . Rectal fissure   . Tinnitus    Social History   Socioeconomic History  . Marital status: Married    Spouse name: Not on file  . Number of children: 2  . Years of education: Not on file  . Highest education level: Not on file  Occupational  History  . 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 Smoking status: Never Smoker  . Smokeless tobacco: Never Used  Vaping Use  . Vaping Use: Never used  Substance and Sexual Activity  . Alcohol use: Yes    Alcohol/week: 0.0 standard drinks    Comment: Once a month  . Drug use: No  . Sexual activity: Never  Other Topics Concern  . Not on file  Social History Narrative   Alcohol- 1 to 2 per month.    Social Determinants of Health   Financial Resource Strain: Not on file  Food Insecurity: Not on file  Transportation Needs: Not on file  Physical Activity: Not on file  Stress: Not on file  Social Connections: Not on file   Family History  Problem Relation Age of Onset  . Emphysema Father   . Clotting disorder Father   . Arthritis Father   . Heart disease Mother   . Arthritis Mother   . Arthritis Maternal Grandmother   . Hypertension Maternal Grandmother   . Diabetes Maternal Grandmother   . Arthritis Paternal Grandmother   . Diabetes Maternal Aunt   . Diabetes Paternal Aunt    Scheduled Meds: . atorvastatin  40 mg Oral Daily  . Chlorhexidine Gluconate Cloth  6 each Topical Daily  . digoxin  0.125 mg Intravenous Daily  . feeding supplement (PROSource TF)  45 mL Per Tube BID  . insulin aspart  0-15 Units Subcutaneous Q4H  . mouth rinse  15 mL Mouth Rinse BID  . nystatin   Topical BID  . sodium chloride flush  10-40 mL Intracatheter Q12H  . sodium chloride flush  3 mL Intravenous Q12H  . tamsulosin  0.4 mg Oral Daily   Continuous Infusions: . sodium chloride 250 mL (02/02/20 2328)  . amiodarone 30 mg/hr (02/11/20 0918)  . feeding supplement (OSMOLITE 1.5 CAL) 30 mL/hr at 02/11/20 0612  . potassium chloride 10 mEq (02/11/20 1221)   PRN Meds:.sodium chloride, acetaminophen **OR** acetaminophen, sodium chloride flush Allergies  Allergen Reactions  . Penicillins Hives    Patient reports full body hives  that required medical treatment when he was in his 82s or 78s. He tolerated cephalosporins.   [name] [Pregabalin] Swelling  . Levofloxacin Swelling   Review of Systems  Unable to perform ROS: Mental status change    Physical Exam Constitutional:      General: He is not in acute distress.    Comments: Poorly responsive, slight movement of head and grunt to verbal interaction  Cardiovascular:     Rate and Rhythm: Tachycardia present. Rhythm irregular.  Pulmonary:     Effort: Pulmonary effort is normal.  Abdominal:     Palpations: Abdomen is soft.  Skin:    General: Skin is warm and dry.     Vital Signs: BP (!) 82/55   Pulse (!) 105   Temp 98.1 F (36.7 C) (Oral)   Resp 14   Ht '5\' 11"'  (1.803 m)   Wt 127.6 kg   SpO2 98%   BMI 39.23 kg/m  Pain  Scale: 0-10   Pain Score: 0-No pain   SpO2: SpO2: 98 % O2 Device:SpO2: 98 % O2 Flow Rate: .O2 Flow Rate (L/min): 2 L/min  IO: Intake/output summary:   Intake/Output Summary (Last 24 hours) at 02/11/2020 1303 Last data filed at 02/11/2020 0608 Gross per 24 hour  Intake 200 ml  Output --  Net 200 ml    LBM: Last BM Date: 02/09/20 Baseline Weight: Weight: 131.5 kg Most recent weight: Weight: 127.6 kg     Palliative Assessment/Data: PPS 30% on tube feeds   Flowsheet Rows   Flowsheet Row Most Recent Value  Intake Tab   Referral Department Hospitalist  Unit at Time of Referral Cardiac/Telemetry Unit  Date Notified 01/31/20  Palliative Care Type Not seen  Reason Not Seen Consult cancelled  Reason for referral Clarify Goals of Care  Date of Admission 01/30/20  # of days IP prior to Palliative referral 1  Clinical Assessment   Psychosocial & Spiritual Assessment   Palliative Care Outcomes       Time Total: 40 minutes Greater than 50%  of this time was spent counseling and coordinating care related to the above assessment and plan.  Juel Burrow, DNP, AGNP-C Palliative Medicine Team [phone] Pager:  [phone]

## 2020-02-11 NOTE — Progress Notes (Signed)
SLP Cancellation Note  Patient Details Name: Leomar Westberg MRN: [id number] DOB: [birth year]   Cancelled treatment:       Reason Eval/Treat Not Completed: Patient's level of consciousness   Chelesa Weingartner, Riley Nearing 02/11/2020, 11:13 AM

## 2020-02-11 NOTE — Progress Notes (Signed)
Progress Note  Patient Name: Gary Butler Date of Encounter: 02/11/2020  [facility] Cardiologist: Shelva Majestic, MD  Subjective   Pt more somnolent this am, not speaking words, rouses to loud verbal or other stimulus. BP lower this am, but MAP > 65  Has mitts on because pt pulls at equipment when more awake.   Inpatient Medications    Scheduled Meds: . atorvastatin  40 mg Oral Daily  . Chlorhexidine Gluconate Cloth  6 each Topical Daily  . digoxin  0.125 mg Intravenous Daily  . feeding supplement (PROSource TF)  45 mL Per Tube BID  . insulin aspart  0-15 Units Subcutaneous Q4H  . mouth rinse  15 mL Mouth Rinse BID  . metoprolol tartrate  5 mg Intravenous Q6H  . nystatin   Topical BID  . sodium chloride flush  10-40 mL Intracatheter Q12H  . sodium chloride flush  3 mL Intravenous Q12H  . tamsulosin  0.4 mg Oral Daily   Continuous Infusions: . sodium chloride 250 mL (02/02/20 2328)  . amiodarone 30 mg/hr (02/10/20 2229)  . feeding supplement (OSMOLITE 1.5 CAL) 30 mL/hr at 02/11/20 0612  . magnesium sulfate bolus IVPB    . potassium chloride     PRN Meds: sodium chloride, acetaminophen **OR** acetaminophen, meclizine, morphine injection, ondansetron **OR** ondansetron (ZOFRAN) IV, oxyCODONE, promethazine, sodium chloride flush, tiZANidine   Vital Signs    Vitals:   02/10/20 1948 02/11/20 0047 02/11/20 0420 02/11/20 0610  BP: 94/74 114/77 99/71 (!) 86/62  Pulse: (!) 125 (!) 112 (!) 113   Resp: 16 20 19 18   Temp: 97.8 F (36.6 C) 97.9 F (36.6 C) 98.5 F (36.9 C)   TempSrc: Oral Oral Oral   SpO2: 99% 99% 99%   Weight:   127.6 kg   Height:        Intake/Output Summary (Last 24 hours) at 02/11/2020 [id number] Last data filed at 02/11/2020 [id number] Gross per 24 hour  Intake 200 ml  Output --  Net 200 ml   Last 3 Weights 02/11/2020 02/10/2020 02/09/2020  Weight (lbs) 281 lb 4.9 oz 275 lb 9.2 oz 275 lb 9.2 oz  Weight (kg) 127.6 kg 125 kg 125 kg      Telemetry     Afib, rate low 100s this am, higher till about 3 am - Personally Reviewed  ECG    No new since 12/25 - Personally Reviewed  Physical Exam   GEN: well developed, morbidly obese male, somnolent unless roused   Neck: No JVD seen, not able to assess accurately Cardiac: Irreg R&R, no murmur, no rubs, or gallops.  Respiratory: diminished to auscultation bilaterally with rales in the bases. GI: Soft, tender, non-distended  MS: 2-3+ LE, scrotal and some UP edema; No deformity. Neuro:  No coherent responses Psych: Not able to assess   Labs    High Sensitivity Troponin:   Recent Labs  Lab 01/30/20 1246 01/30/20 1640  TROPONINIHS 28* 29*      Chemistry Recent Labs  Lab 02/08/20 0043 02/09/20 0144 02/10/20 0230 02/11/20 0555  NA 135 133* 134* 134*  K 3.1* 3.8 3.2* 3.3*  CL 101 101 101 102  CO2 24 18* 21* 23  GLUCOSE 136* 162* 152* 171*  BUN 9 14 11 12   CREATININE 0.79 0.89 0.75 0.62  CALCIUM 7.9* 8.3* 8.1* 8.4*  PROT 3.6*  --   --  3.8*  ALBUMIN 1.8*  --   --  1.8*  AST 10*  --   --  9*  ALT 11  --   --  11  ALKPHOS 55  --   --  62  BILITOT 1.4*  --   --  0.7  GFRNONAA >60 >60 >60 >60  ANIONGAP 10 14 12 9     Magnesium  Date Value Ref Range Status  02/11/2020 1.8 1.7 - 2.4 mg/dL Final    Comment:    Performed at [facility], [place] [address] [place], [place] [address]  02/10/2020 1.8 1.7 - 2.4 mg/dL Final    Comment:    Performed at [facility] [address] [place], [place] [address]  02/09/2020 1.7 1.7 - 2.4 mg/dL Final    Comment:    Performed at [facility] [address], [place], [place] [address]  02/07/2020 1.7 1.7 - 2.4 mg/dL Final    Comment:    Performed at [facility] [address], [place],  [address]     Hematology Recent Labs  Lab 02/08/20 0043 02/09/20 0144 02/10/20 0230  WBC 3.3* 3.9* 3.4*  RBC 4.13* 4.59 4.27  HGB 12.8* 13.8 13.2  HCT 35.7* 39.3 36.7*  MCV 86.4 85.6 85.9  MCH 31.0 30.1 30.9   MCHC 35.9 35.1 36.0  RDW 15.8* 15.9* 16.1*  PLT 52* 45* 31*    BNP Recent Labs  Lab 02/06/20 1407  BNP 218.4*     DDimer No results for input(s): DDIMER in the last 168 hours.   Radiology    CT ANGIO HEAD W OR WO CONTRAST  Result Date: 02/10/2020 CLINICAL DATA:  Left-sided weakness with dizziness and diplopia EXAM: CT ANGIOGRAPHY HEAD AND NECK TECHNIQUE: Multidetector CT imaging of the head and neck was performed using the standard protocol during bolus administration of intravenous contrast. Multiplanar CT image reconstructions and MIPs were obtained to evaluate the vascular anatomy. Carotid stenosis measurements (when applicable) are obtained utilizing NASCET criteria, using the distal internal carotid diameter as the denominator. CONTRAST:  14mL OMNIPAQUE IOHEXOL 350 MG/ML SOLN COMPARISON:  None. FINDINGS: CT HEAD FINDINGS Brain: There is no mass, hemorrhage or extra-axial collection. The size and configuration of the ventricles and extra-axial CSF spaces are normal. There is hypoattenuation of the periventricular white matter, most commonly indicating chronic ischemic microangiopathy. Skull: The visualized skull base, calvarium and extracranial soft tissues are normal. Sinuses/Orbits: No fluid levels or advanced mucosal thickening of the visualized paranasal sinuses. No mastoid or middle ear effusion. The orbits are normal. CTA NECK FINDINGS SKELETON: There is no bony spinal canal stenosis. No lytic or blastic lesion. OTHER NECK: Normal pharynx, larynx and major salivary glands. No cervical lymphadenopathy. Unremarkable thyroid gland. UPPER CHEST: No pneumothorax or pleural effusion. No nodules or masses. AORTIC ARCH: There is no calcific atherosclerosis of the aortic arch. There is no aneurysm, dissection or hemodynamically significant stenosis of the visualized portion of the aorta. Conventional 3 vessel aortic branching pattern. The visualized proximal subclavian arteries are widely  patent. RIGHT CAROTID SYSTEM: Normal without aneurysm, dissection or stenosis. LEFT CAROTID SYSTEM: Normal without aneurysm, dissection or stenosis. VERTEBRAL ARTERIES: Left dominant configuration. Both origins are clearly patent. There is no dissection, occlusion or flow-limiting stenosis to the skull base (V1-V3 segments). CTA HEAD FINDINGS POSTERIOR CIRCULATION: --Vertebral arteries: Normal V4 segments. --Inferior cerebellar arteries: Normal. --Basilar artery: Normal. --Superior cerebellar arteries: Normal. --Posterior cerebral arteries (PCA): Normal. ANTERIOR CIRCULATION: --Intracranial internal carotid arteries: Normal. --Anterior cerebral arteries (ACA): Normal. Both A1 segments are present. Patent anterior communicating artery (a-comm). --Middle  cerebral arteries (MCA): Normal. VENOUS SINUSES: As permitted by contrast timing, patent. ANATOMIC VARIANTS: None Review of the MIP images confirms the above findings. IMPRESSION: 1. No emergent large vessel occlusion or hemodynamically significant stenosis of the head or neck. 2. Chronic ischemic microangiopathy. Electronically Signed   By: Ulyses Jarred M.D.   On: 02/10/2020 01:30   CT ANGIO NECK W OR WO CONTRAST  Result Date: 02/10/2020 CLINICAL DATA:  Left-sided weakness with dizziness and diplopia EXAM: CT ANGIOGRAPHY HEAD AND NECK TECHNIQUE: Multidetector CT imaging of the head and neck was performed using the standard protocol during bolus administration of intravenous contrast. Multiplanar CT image reconstructions and MIPs were obtained to evaluate the vascular anatomy. Carotid stenosis measurements (when applicable) are obtained utilizing NASCET criteria, using the distal internal carotid diameter as the denominator. CONTRAST:  33mL OMNIPAQUE IOHEXOL 350 MG/ML SOLN COMPARISON:  None. FINDINGS: CT HEAD FINDINGS Brain: There is no mass, hemorrhage or extra-axial collection. The size and configuration of the ventricles and extra-axial CSF spaces are normal.  There is hypoattenuation of the periventricular white matter, most commonly indicating chronic ischemic microangiopathy. Skull: The visualized skull base, calvarium and extracranial soft tissues are normal. Sinuses/Orbits: No fluid levels or advanced mucosal thickening of the visualized paranasal sinuses. No mastoid or middle ear effusion. The orbits are normal. CTA NECK FINDINGS SKELETON: There is no bony spinal canal stenosis. No lytic or blastic lesion. OTHER NECK: Normal pharynx, larynx and major salivary glands. No cervical lymphadenopathy. Unremarkable thyroid gland. UPPER CHEST: No pneumothorax or pleural effusion. No nodules or masses. AORTIC ARCH: There is no calcific atherosclerosis of the aortic arch. There is no aneurysm, dissection or hemodynamically significant stenosis of the visualized portion of the aorta. Conventional 3 vessel aortic branching pattern. The visualized proximal subclavian arteries are widely patent. RIGHT CAROTID SYSTEM: Normal without aneurysm, dissection or stenosis. LEFT CAROTID SYSTEM: Normal without aneurysm, dissection or stenosis. VERTEBRAL ARTERIES: Left dominant configuration. Both origins are clearly patent. There is no dissection, occlusion or flow-limiting stenosis to the skull base (V1-V3 segments). CTA HEAD FINDINGS POSTERIOR CIRCULATION: --Vertebral arteries: Normal V4 segments. --Inferior cerebellar arteries: Normal. --Basilar artery: Normal. --Superior cerebellar arteries: Normal. --Posterior cerebral arteries (PCA): Normal. ANTERIOR CIRCULATION: --Intracranial internal carotid arteries: Normal. --Anterior cerebral arteries (ACA): Normal. Both A1 segments are present. Patent anterior communicating artery (a-comm). --Middle cerebral arteries (MCA): Normal. VENOUS SINUSES: As permitted by contrast timing, patent. ANATOMIC VARIANTS: None Review of the MIP images confirms the above findings. IMPRESSION: 1. No emergent large vessel occlusion or hemodynamically significant  stenosis of the head or neck. 2. Chronic ischemic microangiopathy. Electronically Signed   By: Ulyses Jarred M.D.   On: 02/10/2020 01:30   MR BRAIN WO CONTRAST  Result Date: 02/09/2020 CLINICAL DATA:  Neuro deficit, acute, stroke suspected EXAM: MRI HEAD WITHOUT CONTRAST TECHNIQUE: Multiplanar, multiecho pulse sequences of the brain and surrounding structures were obtained without intravenous contrast. COMPARISON:  10/29/2019 and prior. FINDINGS: Brain: 2-3 mm right centrum semiovale restricted diffusion. No intracranial hemorrhage. No midline shift, ventriculomegaly or extra-axial fluid collection. No mass lesion. Diffuse parenchymal volume loss with ex vacuo dilatation. Chronic right corona radiata and left cerebellar lacunar insults. Moderate chronic microvascular ischemic changes. Vascular: Normal flow voids. Skull and upper cervical spine: Normal marrow signal. Sinuses/Orbits: Normal orbits. Mild ethmoid sinus mucosal thickening. Minimal bilateral mastoid free fluid. Other: None. 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 Chronic right corona radiata and left cerebellar lacunar insults. Age-related cerebral atrophy and moderate chronic microvascular ischemic  changes. These results will be called to the ordering clinician or representative by the Radiologist Assistant, and communication documented in the PACS or [employer]. Electronically Signed   By: Primitivo Gauze M.D.   On: 02/09/2020 14:52   DG Chest Port 1 View  Result Date: 02/10/2020 CLINICAL DATA:  Chest pain EXAM: PORTABLE CHEST 1 VIEW COMPARISON:  None. FINDINGS: The heart size and mediastinal contours are within normal limits. Tip of the OG tube is seen off the distal esophagus. A right-sided PICC seen with the tip in the superior cavoatrial junction. Small bilateral pleural effusions are seen. The visualized skeletal structures are unremarkable. IMPRESSION: Tip the NG tube seen within the distal esophagus.  Small bilateral pleural effusions. Electronically Signed   By: Prudencio Pair M.D.   On: 02/10/2020 19:16   DG Abd Portable 1V  Result Date: 02/10/2020 CLINICAL DATA:  Feeding tube placement. EXAM: PORTABLE ABDOMEN - 1 VIEW COMPARISON:  None. FINDINGS: The bowel gas pattern is normal. Distal tip of feeding tube is seen in expected position of distal stomach. No radio-opaque calculi or other significant radiographic abnormality are seen. IMPRESSION: Distal tip of feeding tube seen in expected position of distal stomach. Electronically Signed   By: Marijo Conception M.D.   On: 02/10/2020 14:26   ECHOCARDIOGRAM COMPLETE  Result Date: 02/10/2020    ECHOCARDIOGRAM REPORT   Patient Name:   NOHE KUCZMARSKI  Date of Exam: 02/10/2020 Medical Rec #:  [id number]  Height:       71.0 in Accession #:    [id number] Weight:       275.6 lb Date of Birth:  05-May-1945  BSA:          2.416 m Patient Age:    34 years   BP:           108/69 mmHg Patient Gender: M          HR:           115 bpm. Exam Location:  Inpatient Procedure: 2D Echo Indications:    Stroke 434.91 / I163.9  History:        Patient has prior history of Echocardiogram examinations, most                 recent 02/07/2020. CHF, Arrythmias:Atrial Fibrillation; Risk                 Factors:Diabetes, Hypertension and Sleep Apnea.  Sonographer:    Darlina Sicilian RDCS Referring Phys: [id number] ASHISH ARORA  Sonographer Comments: Technically challenging study due to limited acoustic windows, suboptimal parasternal window, suboptimal apical window and suboptimal subcostal window. Patient declined the use of definity. IMPRESSIONS Extremely limited study, poor visualization of LV/RV and patient declined contrast. If concern for embolic source of CVA, recommend TEE for further evaluation FINDINGS  Left Ventricle: Left ventricular ejection fraction is not well visualized. The left ventricle has not well visualized function. Left ventricular endocardial border not optimally defined to  evaluate regional wall motion. The left ventricular internal cavity size was not well visualized. There is not well visualized left ventricular hypertrophy. Left ventricular diastolic parameters are indeterminate. Right Ventricle: The right ventricular size is not well visualized. Right vetricular wall thickness was not well visualized. Right ventricular systolic function was not well visualized. Left Atrium: Left atrial size was not well visualized. Right Atrium: Right atrial size was not well visualized. Pericardium: The pericardium was not well visualized. Mitral Valve: The mitral valve was not well visualized. Not well  visualized mitral valve regurgitation. Tricuspid Valve: The tricuspid valve is not well visualized. Tricuspid valve regurgitation not well visualized. Aortic Valve: The aortic valve was not well visualized. Aortic valve regurgitation not well visualized. Pulmonic Valve: The pulmonic valve was not well visualized. Pulmonic valve regurgitation not well visualized. Aorta: The aortic root was not well visualized. IAS/Shunts: The interatrial septum was not well visualized. Epifanio Lesches MD Electronically signed by Epifanio Lesches MD Signature Date/Time: 02/10/2020/7:21:33 PM    Final    [place] EKG SITE RITE  Result Date: 02/10/2020 If Site Rite image not attached, placement could not be confirmed due to current cardiac rhythm.   Cardiac Studies   Limited echo 02/07/20 1. Very limited study quality, LVEF determined by [employer] echocontrast and appears normal 55-60%, right ventricle is not visualized.  TTE 11/11/2019 1. Left ventricular ejection fraction, by estimation, is 60 to 65%. The  left ventricle has normal function. The left ventricle has no regional  wall motion abnormalities. Left ventricular diastolic parameters are  consistent with Grade II diastolic  dysfunction (pseudonormalization).  2. Right ventricular systolic function is normal. The right ventricular  size  is normal.  3. The mitral valve is normal in structure. No evidence of mitral valve regurgitation. No evidence of mitral stenosis.  4. The aortic valve is normal in structure. Aortic valve regurgitation is not visualized. No aortic stenosis is present.  5. The inferior vena cava is normal in size with greater than 50%  respiratory variability, suggesting right atrial pressure of 3 mmHg.   Patient Profile     74 y.o. male with diabetes, hypertension, A. fib, HFpEF, obesity who was admitted on 01/30/2020 for nausea/vomiting and found to have gangrenous cholecystitis.  Cardiology initially consulted for preoperative assessment. Course was complicated by worsening thrombocytopenia, afib RVR and acute CVA 12/28.   Cardiology was reconsulted on 02/06/2020 for A. fib with RVR and low blood pressure.  Assessment & Plan    A. fib with RVR Hypotension - s/p dig load >> 0.125 mg qd - amio started 12/27, continued IV at 30 mg/hr - BP does not tolerated IV Cardizem - IV metoprolol held this am due to SBP 70s - Eliquis d/c'd 12/28 2nd strict NPO - heparin given right after admit, none since 12/22 - plt now too low to anticoagulate  Volume overloaded Elevated BNP/low albumin Acute on chronic diastolic heart failure - EF 55% on limited echo 12/26 - albumin 1.8 >> supplemented and now 3.8 - I/O inaccurate due to unable to determine output, has scrotal edema, no indwelling foley in place - on tube feeds - markedly volume overloaded, discuss plan w/ MD  Hypokalemia: - K+ was 3.2, got 6 runs and now K+ 3.3 >> 6 more runs ordered  Hypomagnesemia - Mg 1.7, supplemented and now 1.8, will supplement  Type II diabetes - Jardiance ok, but need to sub something for pioglitazone once he recovers, due to CHF - on SSI, per IM  Thrombocytopenia - Plt 255 on 10/20 >> 139 on 12/28 >> have been dropping since admit - Plt were 31 on 12/29, has been ordered for today, not done yet, will discuss w/  RN  Hx DNR - pt has been followed by Palliative Care - With worsening condition, discuss w/ MD if consult needed  His clinical condition with recent stroke has worsened, needs close monitoring. Multiple issues include acute stroke, afib RVR, thrombocytopenia, electrolyte abnormalities. Complex medical decision making.  For questions or updates, please contact [facility] Please  consult [email or web address] for contact info under     Signed, Rosaria Ferries, PA-C  02/11/2020, 8:10 AM

## 2020-02-11 NOTE — Progress Notes (Signed)
OT Cancellation Note  Patient Details Name: Mattheu Brodersen MRN: [id number] DOB: 01/24/1946   Cancelled Treatment:    Reason Eval/Treat Not Completed: Fatigue/lethargy limiting ability to participate  Suzanna Obey 02/11/2020, 2:42 PM

## 2020-02-11 NOTE — Consult Note (Signed)
NAME:  Gary Butler, MRN:  [id number], DOB:  11/03/1945, LOS: 12 ADMISSION DATE:  01/30/2020, CONSULTATION DATE:  12/30 REFERRING MD:  Dr. Rhona Leavens, CHIEF COMPLAINT:  Hypotension/Lethargy   Brief History:  Last 24 hours complicated by Hypotension, A.Fib RVR, and Lethargy. Started on IV digoxin 12/29. This AM with systolic 60-80. Given 1.5L bolus and 25% Albumin x 1 without improvement. Critical Care Consulted.   History of Present Illness:  74 year old male presents to ED on 12/18 with nausea, vomiting. Found to have gangrenous cholecystitis. Underwent Lab Chole on 12/21. 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    Daughter reports that patient has been in and out of hospital since September. Presented 9/28 with frequent bouts of diarrhea and rectal pain for 6 weeks, history of left side perianal fistula s/p fistultomy. Found to have perineal abscess, taken to OR for I&D. Course complicated by E.Coli UTI. At discharge patient continued to have urinary retention, discharged with indwelling catheter. 10/10 re-admitted for bright red blood and clots per rectum with episode of hematochezia, suspected secondary to hemorrhoids.   Past Medical History:  OSA, psoriatic arthritis, morbid obesity, HTN, DM, Chronic Diastolic Heart Failure, BPH, esophageal dysphagia, followed by [facility] Events:  12/21 > S/P Lap Chole 12/25 > A.Fib RVR 12/26 > Rapid for Lethargy and Hypotension  12/28 > Right Centrum Semiovale acute/subacute infarct   Consults:  Cardiology  Neurology  PCCM  Procedures:  Right Basilic PICC 12/29 >>  Significant Diagnostic Tests:  CT A/P 12/18 > 1. Cholelithiasis with new air-fluid level in the gallbladder. Evidence for mild pericholecystic stranding. 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 Recommend surgical consultation. 2.  Diffuse wall thickening involving the rectum with perirectal edema. Findings are suggestive for inflammation and proctitis. Underlying neoplastic process cannot be excluded and recommend GI consultation. Questionable wall thickening involving the right colon. 3. Distention of the urinary bladder without hydronephrosis. 4. Bilateral renal cysts. MR Brain 12/28 > 2-3 mm right centrum semiovale acute/subacute infarct.Chronic right corona radiata and left cerebellar lacunar insults. Age-related cerebral atrophy and moderate chronic microvascular ischemic changes CTA Neck/Head 12/29 > 1. No emergent large vessel occlusion or hemodynamically significant stenosis of the head or neck. 2. Chronic ischemic microangiopathy.  Micro Data:  Blood 12/18 > negative MRSA PCR 12/19 > negative Staph Aureus 12/21 > positive  Blood 12/25 > negative   Antimicrobials:  Cefepime/Vanomycin 12/19 > 12/23  Interim History / Subjective:  As above.   Objective   Blood pressure (!) 86/61, pulse (!) 105, temperature 98.1 F (36.7 C), temperature source Oral, resp. rate 15, height 5\' 11"  (1.803 m), weight 127.6 kg, SpO2 98 %.        Intake/Output Summary (Last 24 hours) at 02/11/2020 1443 Last data filed at 02/11/2020 1421 Gross per 24 hour  Intake 1445.64 ml  Output -  Net 1445.64 ml   Filed Weights   02/09/20 0448 02/10/20 0355 02/11/20 0420  Weight: 125 kg 125 kg 127.6 kg    Examination: General: Adult male, critically ill, lying in bed  HENT: dry mm, small bore in place  Lungs: coarse breath sounds, no use of accessory muscle use  Cardiovascular: Tachy Abdomen: obese, soft, generalized tenderness  Extremities: +2 BLE edema and +3 BUE edema Neuro: lethargic, opens eyes with physical stimulation, does not follow commands   GU: intact   Resolved Hospital Problem list  Assessment & Plan:   Encephalopathy Acute/Subacute R centrum semiovale CVA Plan -Neurology Following  -Treatment as  below  Hypotension, septic shock, with some medication effect with metoprolol, amiodarone, digoxin  -Given 1.5 L fluid without response Plan -Send Lactic Acid  -Add scheduled albumin and midodrine >> have spoke with family at length regarding [name]. Do not want to escalate to vasopressor use   A.Fib RVR Chronic Diastolic HF Plan -Cardiology Following  -Continuing Digoxin and Amiodarone   Hypomagnesemia, Hypokalemia, Hypophosphatemia Plan -Trend BMP -Replace electrolytes as indicated   Thrombocytopenia, suspected secondary to infectious process  Plan -Send DIC Panel  -Continue to hold all anticoagulation  Gangrenous Cholecystitis s/p lap Chole 12/21 Plan -Surgery Signed off  Best practice (evaluated daily)  Diet: TF Pain/Anxiety/Delirium protocol (if indicated): N/A VAP protocol (if indicated): N/A DVT prophylaxis: SCD GI prophylaxis: PPI Glucose control: SSi Mobility: Bedrest Disposition: Continue Current level of care. Discussion as below.    Goals of Care:  Last date of multidisciplinary goals of care discussion: Family and staff present:  Summary of discussion:  Follow up goals of care discussion due:  Code Status: DNR. Prolonged discussion with wife and daughter at bedside. Wife states that Gary Butler has been through so much since September and is very weak and frail, she has watched him decline since and "suffer". She does not want him to suffer any longer. Went over his current medical condition and state. Does not want to escalate care to include vasopressors. Wishes to continue with current medical plan. Understands that has pretty significant hypotension. Wishes to stay at the beside in case he expires tonight, if he does not she wishes to meet with palliative care at 10 am to discuss next steps. >> This has been relayed to palliative care and Dr. Wyline Copas. Given this discussion and plans for next 24 hours patient will stay on current unit as critical care has nothing further  to offer. At this time PCCM will sign off.   Labs   CBC: Recent Labs  Lab 02/07/20 0639 02/08/20 0043 02/09/20 0144 02/10/20 0230 02/11/20 0834  WBC 3.9* 3.3* 3.9* 3.4* 3.6*  NEUTROABS 2.5 2.2 2.8 2.4 2.5  HGB 14.0 12.8* 13.8 13.2 12.7*  HCT 40.6 35.7* 39.3 36.7* 34.9*  MCV 88.6 86.4 85.6 85.9 85.7  PLT 61* 52* 45* 31* 29*    Basic Metabolic Panel: Recent Labs  Lab 02/05/20 1007 02/06/20 1407 02/07/20 0639 02/08/20 0043 02/09/20 0144 02/10/20 0230 02/10/20 2200 02/11/20 0555  NA 136   < > 133* 135 133* 134*  --  134*  K 4.9   < > 3.2* 3.1* 3.8 3.2*  --  3.3*  CL 108   < > 102 101 101 101  --  102  CO2 14*   < > 19* 24 18* 21*  --  23  GLUCOSE 128*   < > 138* 136* 162* 152*  --  171*  BUN 11   < > 9 9 14 11   --  12  CREATININE 0.77   < > 0.78 0.79 0.89 0.75  --  0.62  CALCIUM 8.0*   < > 8.1* 7.9* 8.3* 8.1*  --  8.4*  MG 2.0  --  1.7  --  1.7 1.8  --  1.8  PHOS  --   --   --   --   --   --  2.0* 2.1*   < > = values in this interval not displayed.   GFR: Estimated Creatinine  Clearance: 108.6 mL/min (by C-G formula based on SCr of 0.62 mg/dL). Recent Labs  Lab 02/06/20 1407 02/06/20 1623 02/07/20 0639 02/08/20 0043 02/09/20 0144 02/10/20 0230 02/11/20 0834  PROCALCITON  --   --  <0.10  --   --   --   --   WBC 3.9*  --  3.9* 3.3* 3.9* 3.4* 3.6*  LATICACIDVEN 1.9 1.7  --   --   --   --   --     Liver Function Tests: Recent Labs  Lab 02/08/20 0043 02/11/20 0555  AST 10* 9*  ALT 11 11  ALKPHOS 55 62  BILITOT 1.4* 0.7  PROT 3.6* 3.8*  ALBUMIN 1.8* 1.8*   No results for input(s): LIPASE, AMYLASE in the last 168 hours. No results for input(s): AMMONIA in the last 168 hours.  ABG    Component Value Date/Time   PHART 7.562 (H) 02/07/2020 1703   PCO2ART 24.3 (L) 02/07/2020 1703   PO2ART 70.1 (L) 02/07/2020 1703   HCO3 22.0 02/07/2020 1703   ACIDBASEDEF 0.2 02/07/2020 1703   O2SAT 95.3 02/07/2020 1703     Coagulation Profile: No results for  input(s): INR, PROTIME in the last 168 hours.  Cardiac Enzymes: No results for input(s): CKTOTAL, CKMB, CKMBINDEX, TROPONINI in the last 168 hours.  HbA1C: Hgb A1c MFr Bld  Date/Time Value Ref Range Status  02/10/2020 02:30 AM 6.7 (H) 4.8 - 5.6 % Final    Comment:    (NOTE) Pre diabetes:          5.7%-6.4%  Diabetes:              >6.4%  Glycemic control for   <7.0% adults with diabetes   01/30/2020 12:46 PM 6.5 (H) 4.8 - 5.6 % Final    Comment:    (NOTE) Pre diabetes:          5.7%-6.4%  Diabetes:              >6.4%  Glycemic control for   <7.0% adults with diabetes     CBG: Recent Labs  Lab 02/10/20 2015 02/11/20 0026 02/11/20 0425 02/11/20 0822 02/11/20 1157  GLUCAP 133* 162* 163* 165* 150*    Review of Systems:   Unable to review given encephalopathy   Past Medical History:  He,  has a past medical history of Arthritis, CHF (congestive heart failure) ([place]), Constipation, Diabetes mellitus, Heart murmur, History of gout, History of skin cancer, Hypertension, Morbid (severe) obesity due to excess calories ([place]), OSA (obstructive sleep apnea), Psoriatic arthritis ([place]), Rectal fissure, and Tinnitus.   Surgical History:   Past Surgical History:  Procedure Laterality Date  . addenoids    . ANAL FISTULOTOMY  2019   Dr Drue Flirt, [place]  . CHOLECYSTECTOMY N/A 02/02/2020   Procedure: LAPAROSCOPIC CHOLECYSTECTOMY;  Surgeon: Georganna Skeans, MD;  Location: [facility];  Service: General;  Laterality: N/A;  . IRRIGATION AND DEBRIDEMENT ABSCESS N/A 11/10/2019   Procedure: INCISION AND DRAINAGE OF HIGH SCROTAL ABSCESS;  Surgeon: Michael Boston, MD;  Location: [facility];  Service: General;  Laterality: N/A;  . JOINT REPLACEMENT  2012   RT KNEE  . LUNG BIOPSY     20 YRS AGO  . RECTAL EXAM UNDER ANESTHESIA N/A 11/10/2019   Procedure: ANORECTAL EXAM UNDER ANESTHESIA;  Surgeon: Michael Boston, MD;  Location: [facility];  Service: General;  Laterality: N/A;  . TONSILLECTOMY    . TOTAL KNEE  ARTHROPLASTY Bilateral 06/22/2013   Procedure: LEFT TOTAL KNEE ARTHROPLASTY;  MEDIAL SOFT TISSUE EXPLORATION SAPHENOUS NEURECTOMY RIGHT KNEE;  Surgeon: Shelda PalMatthew D Olin, MD;  Location: [facility];  Service: Orthopedics;  Laterality: Bilateral;     Social History:   reports that he has never smoked. He has never used smokeless tobacco. He reports current alcohol use. He reports that he does not use drugs.   Family History:  His family history includes Arthritis in his father, maternal grandmother, mother, and paternal grandmother; Clotting disorder in his father; Diabetes in his maternal aunt, maternal grandmother, and paternal aunt; Emphysema in his father; Heart disease in his mother; Hypertension in his maternal grandmother.   Allergies Allergies  Allergen Reactions  . Penicillins Hives    Patient reports full body hives that required medical treatment when he was in his 30s or 40s. He tolerated cephalosporins.   Roselee Nova. Lyrica [Pregabalin] Swelling  . Levofloxacin Swelling     Home Medications  Prior to Admission medications   Medication Sig Start Date End Date Taking? Authorizing Provider  Albuterol Sulfate (PROAIR RESPICLICK) 108 (90 Base) MCG/ACT AEPB Inhale 2 puffs into the lungs every 6 (six) hours as needed (for wheezing or shortness of breath). 12/10/19  Yes Ngetich, Dinah C, NP  allopurinol (ZYLOPRIM) 300 MG tablet TAKE 1 TABLET(300 MG) BY MOUTH DAILY Patient taking differently: Take 300 mg by mouth daily. 12/10/19  Yes Ngetich, Dinah C, NP  atorvastatin (LIPITOR) 40 MG tablet TAKE 1 TABLET(40 MG) BY MOUTH EVERY EVENING 12/10/19  Yes Ngetich, Dinah C, NP  bisoprolol (ZEBETA) 5 MG tablet Take 0.5 tablets (2.5 mg total) by mouth daily. Patient taking differently: Take 5 mg by mouth daily. 12/10/19 02/08/20 Yes Ngetich, Dinah C, NP  cetirizine (ZYRTEC) 10 MG tablet TAKE 1 TABLET(10 MG) BY MOUTH DAILY Patient taking differently: Take by mouth daily. 02/09/19  Yes Sheliah Hatchabori, Katherine E, MD   Cholecalciferol (VITAMIN D) 2000 UNITS tablet Take 2,000 Units by mouth daily.   Yes [provider]  Cyanocobalamin (B-12) 2000 MCG TABS Take 2,000 mcg by mouth daily.   Yes [provider]  donepezil (ARICEPT) 10 MG tablet TAKE 1 TABLET(10 MG) BY MOUTH AT BEDTIME Patient taking differently: Take by mouth at bedtime. 5 mg at bedtime 12/10/19  Yes Ngetich, Dinah C, NP  fluocinonide cream (LIDEX) 0.05 % Apply 1 application topically 2 (two) times daily. To arms for psoriasis 12/10/19  Yes Ngetich, Dinah C, NP  fluticasone (FLONASE) 50 MCG/ACT nasal spray Place 2 sprays into both nostrils daily. 12/10/19  Yes Ngetich, Dinah C, NP  folic acid (FOLVITE) 1 MG tablet Take 1 mg by mouth daily. 04/29/12  Yes [provider]  furosemide (LASIX) 20 MG tablet TAKE 1 TABLET(20 MG) BY MOUTH DAILY Patient taking differently: Take by mouth daily. 12/10/19  Yes Ngetich, Dinah C, NP  JARDIANCE 10 MG TABS tablet TAKE 1 TABLET BY MOUTH DAILY BEFORE BREAKFAST Patient taking differently: Take 10 mg by mouth daily. 12/15/19  Yes Sheliah Hatchabori, Katherine E, MD  meloxicam (MOBIC) 15 MG tablet Take 15 mg by mouth daily.   Yes [provider]  nystatin ointment (MYCOSTATIN) Apply 1 application topically daily. Apply to groin and perineal area 12/10/19  Yes Ngetich, Dinah C, NP  omeprazole (PRILOSEC) 40 MG capsule TAKE 1 CAPSULE BY MOUTH 30 TO 60 MINUTES BEFORE YOUR FIRST AND LAST MEAL OF THE DAY Patient taking differently: Take 40 mg by mouth daily. 12/10/19  Yes Ngetich, Dinah C, NP  pioglitazone (ACTOS) 30 MG tablet Take 30 mg by mouth daily. 01/01/20  Yes [provider]  potassium chloride (MICRO-K) 10 MEQ CR capsule TAKE 1 CAPSULE BY MOUTH EVERY DAY Patient taking differently: Take 20 mEq by mouth daily. 12/10/19  Yes Ngetich, Dinah C, NP  promethazine (PHENERGAN) 25 MG tablet Take 1 tablet (25 mg total) by mouth every 8 (eight) hours as needed for nausea or vomiting. Alternate w/  Ondansetron (Zofran) 01/27/20  Yes Sheliah Hatch, MD  psyllium (HYDROCIL/METAMUCIL) 95 % PACK Take 1 packet by mouth 2 (two) times daily. 11/15/19 02/13/20 Yes Darlin Drop, DO  tamsulosin (FLOMAX) 0.4 MG CAPS capsule Take 0.4 mg by mouth daily.   Yes [provider]  tiZANidine (ZANAFLEX) 4 MG tablet Take 0.5-1 tablets (2-4 mg total) by mouth every 8 (eight) hours as needed for muscle spasms. Patient taking differently: Take 4 mg by mouth daily as needed for muscle spasms. spasms 12/28/19  Yes Waldon Merl, PA-C     Critical care time: 62 minutes     Jovita Kussmaul, AGACNP-BC [facility]  PCCM Pgr: [phone]

## 2020-02-12 ENCOUNTER — Inpatient Hospital Stay (HOSPITAL_COMMUNITY): Payer: Medicare PPO

## 2020-02-12 DIAGNOSIS — A419 Sepsis, unspecified organism: Secondary | ICD-10-CM | POA: Diagnosis not present

## 2020-02-12 DIAGNOSIS — Z7189 Other specified counseling: Secondary | ICD-10-CM | POA: Diagnosis not present

## 2020-02-12 DIAGNOSIS — Z515 Encounter for palliative care: Secondary | ICD-10-CM | POA: Diagnosis not present

## 2020-02-12 DIAGNOSIS — I639 Cerebral infarction, unspecified: Secondary | ICD-10-CM | POA: Diagnosis not present

## 2020-02-12 DIAGNOSIS — R627 Adult failure to thrive: Secondary | ICD-10-CM

## 2020-02-12 DIAGNOSIS — I4891 Unspecified atrial fibrillation: Secondary | ICD-10-CM | POA: Diagnosis not present

## 2020-02-12 DIAGNOSIS — Z66 Do not resuscitate: Secondary | ICD-10-CM | POA: Diagnosis not present

## 2020-02-12 LAB — GLUCOSE, CAPILLARY
Glucose-Capillary: 130 mg/dL — ABNORMAL HIGH (ref 70–99)
Glucose-Capillary: 138 mg/dL — ABNORMAL HIGH (ref 70–99)
Glucose-Capillary: 159 mg/dL — ABNORMAL HIGH (ref 70–99)
Glucose-Capillary: 175 mg/dL — ABNORMAL HIGH (ref 70–99)
Glucose-Capillary: 176 mg/dL — ABNORMAL HIGH (ref 70–99)
Glucose-Capillary: 181 mg/dL — ABNORMAL HIGH (ref 70–99)
Glucose-Capillary: 187 mg/dL — ABNORMAL HIGH (ref 70–99)

## 2020-02-12 LAB — BASIC METABOLIC PANEL
Anion gap: 8 (ref 5–15)
BUN: 12 mg/dL (ref 8–23)
CO2: 24 mmol/L (ref 22–32)
Calcium: 8.7 mg/dL — ABNORMAL LOW (ref 8.9–10.3)
Chloride: 103 mmol/L (ref 98–111)
Creatinine, Ser: 0.63 mg/dL (ref 0.61–1.24)
GFR, Estimated: >60 mL/min (ref >60)
Glucose, Bld: 189 mg/dL — ABNORMAL HIGH (ref 70–99)
Potassium: 3.5 mmol/L (ref 3.5–5.1)
Sodium: 135 mmol/L (ref 135–145)

## 2020-02-12 LAB — CBC WITH DIFFERENTIAL/PLATELET
Abs Immature Granulocytes: 0.01 10*3/uL (ref 0.00–0.07)
Basophils Absolute: 0 10*3/uL (ref 0.0–0.1)
Basophils Relative: 1 %
Eosinophils Absolute: 0.1 10*3/uL (ref 0.0–0.5)
Eosinophils Relative: 3 %
HCT: 31.2 % — ABNORMAL LOW (ref 39.0–52.0)
Hemoglobin: 10.8 g/dL — ABNORMAL LOW (ref 13.0–17.0)
Immature Granulocytes: 0 %
Lymphocytes Relative: 24 %
Lymphs Abs: 0.7 10*3/uL (ref 0.7–4.0)
MCH: 30.2 pg (ref 26.0–34.0)
MCHC: 34.6 g/dL (ref 30.0–36.0)
MCV: 87.2 fL (ref 80.0–100.0)
Monocytes Absolute: 0.1 10*3/uL (ref 0.1–1.0)
Monocytes Relative: 3 %
Neutro Abs: 1.9 10*3/uL (ref 1.7–7.7)
Neutrophils Relative %: 69 %
Platelets: 20 10*3/uL — CL (ref 150–400)
RBC: 3.58 MIL/uL — ABNORMAL LOW (ref 4.22–5.81)
RDW: 15.9 % — ABNORMAL HIGH (ref 11.5–15.5)
WBC: 2.8 10*3/uL — ABNORMAL LOW (ref 4.0–10.5)
nRBC: 0 % (ref 0.0–0.2)

## 2020-02-12 LAB — PROCALCITONIN: Procalcitonin: <0.1 ng/mL

## 2020-02-12 LAB — MAGNESIUM: Magnesium: 1.7 mg/dL (ref 1.7–2.4)

## 2020-02-12 MED ORDER — POTASSIUM CHLORIDE CRYS ER 10 MEQ PO TBCR
30.0000 meq | EXTENDED_RELEASE_TABLET | ORAL | Status: AC
  Administered 2020-02-12 (×3): 30 meq via ORAL
  Filled 2020-02-12 (×2): qty 1

## 2020-02-12 MED ORDER — MAGNESIUM OXIDE 400 (241.3 MG) MG PO TABS
800.0000 mg | ORAL_TABLET | Freq: Once | ORAL | Status: AC
  Administered 2020-02-12: 800 mg via ORAL
  Filled 2020-02-12: qty 2

## 2020-02-12 MED ORDER — SODIUM CHLORIDE 0.9 % IV BOLUS
250.0000 mL | Freq: Once | INTRAVENOUS | Status: AC
  Administered 2020-02-12: 250 mL via INTRAVENOUS

## 2020-02-12 NOTE — Progress Notes (Signed)
PT Cancellation Note  Patient Details Name: Gary Butler MRN: [id number] DOB: 26-Dec-1945   Cancelled Treatment:    Reason Eval/Treat Not Completed: Other (comment).  Returned to re-attempt therapy, however, pt's sizewise air mattress bed has stopped working.  PT, RN and RN techs in room attempting to get it working again.  Pt in pain laying on hard lower bed surface.  Bed will likely have to be exchanged.   PT will check back later.  Also, met RN case manager in the hallway who reports pt is going home with home hospice.  He will need hospital bed with air mattress, ambulance transport home.   PT will follow up on Monday 02/15/19 if pt is still here.   Thanks,  Verdene Lennert, PT, DPT  Acute Rehabilitation [phone] pager #[phone] office       Gary Butler 02/12/2020, 12:11 PM

## 2020-02-12 NOTE — Progress Notes (Signed)
   Referral received from [facility] NP with PC-- Also spoke to [place] with transitions of care-  I did speak to both the daughter and the pt's wife and discussed hospice care at home. They are in agreement with hospice services and would like to proceed with our help once they get home. They do not feel they will need any additional equipment at this time for pt to come home. Our MD has approved the pt for hospice care at home. We will continue to follow pt while in hospital until stable and ready for d/c home.   Norm Parcel RN [phone]

## 2020-02-12 NOTE — Progress Notes (Signed)
  Speech Language Pathology Treatment: Dysphagia  Patient Details Name: Gary Butler MRN: [id number] DOB: 06-22-1945 Today's Date: 02/12/2020 Time: 0900-0930 SLP Time Calculation (min) (ACUTE ONLY): 30 min  Assessment / Plan / Recommendation Clinical Impression  Pt fully alert, asking for PO. Removed mitt and pt initiated oral care. After cleaning out mouth pt able to drink water and masticate crackers with appropriate attention, no difficulty. He does continue to endorse esophageal dysphagia but states "ive been doing better with that recently." Pt is able to make appropriate food choices with staff assistance. Discussed with RN. Will start dys 3 (mecho soft) diet and thin liquids. Likely pt will be ready for NG tube removal as long as mentation stays stable.   HPI HPI: Pt is a 74 yo male who presented with nausea and anorexia. Prior admission from 9/27-10/5 with perirectal abscess s/p I&D. He also had new onset afib and was started on Eliquis. Ongoing lingering symptoms for 6 to 8 months, suspect acute on chronic cholecystitis. S/p cholecystectomy 12/21. While working with PT 12/28 he was noted to have increased dizziness and L sided weakness. 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 PMH includes: OSA; psoriatic arthritis; morbid obesity (BMI 40); HTN; DM; BPH, chronic diastolic CHF (10/2019 echo with preserved EF and grade 2 diastolic dysfunction), difficulty swallowing over the past year (coughing, gagging, bringing up white phlegm) for which he was seen by GI and had an esophagram that revealed tertiary contractions and mild esophageal dysmotility      SLP Plan  Continue with current plan of care       Recommendations  Diet recommendations: Thin liquid;Dysphagia 3 (mechanical soft) Medication Administration: Whole meds with liquid                Plan: Continue with current plan of care       GO               Harlon Ditty, MA CCC-SLP  Acute  Rehabilitation Services Pager [phone] Office [phone]  Claudine Mouton 02/12/2020, 9:53 AM

## 2020-02-12 NOTE — Patient Care Conference (Signed)
Called and updated family over phone. Family is aware of concerns of acute difficulty speaking this afternoon. STAT head CT ordered and reviewed with no acute changes noted. SLP evaluated earlier and is clear for diet. Pt has been wanting NG removed secondary to discomfort. Bedside trial attempted by RN and pt was ultimately able to tolerate swallow trials. Discussed with family, who are aware that given pt's recent stroke, he remains at risk for aspiration. Will d/c NG and start PO with aspiration precautions

## 2020-02-12 NOTE — Progress Notes (Signed)
PT Cancellation Note  Patient Details Name: Gary Butler MRN: [id number] DOB: 1945-05-25   Cancelled Treatment:    Reason Eval/Treat Not Completed: Other (comment).  Is having a family meeting per RN, Marcelino Duster.  PT to check back later today.  Thanks,  Corinna Capra, PT, DPT  Acute Rehabilitation [phone] pager #[phone] office       [name] 02/12/2020, 10:11 AM

## 2020-02-12 NOTE — Progress Notes (Signed)
Patient transported by this nurse to CT .   Wife called and stated her son called and stated he was not making sense.  She stated that the MD did say that it was expected because of all of his problems.  She stated she was too tired to return to the hospital tonight but she would call back in a few hours for an update.

## 2020-02-12 NOTE — Progress Notes (Signed)
Mobility Specialist: Progress Note   02/12/20 1617  Mobility  Activity Contraindicated/medical hold   Attempted to work with pt. Pt not making sense when attempting to communicate, RN notified.   [facility] Eusebio Blazejewski Mobility Specialist

## 2020-02-12 NOTE — TOC Progression Note (Signed)
Transition of Care (TOC) - Progression Note  Donn Pierini RN, BSN Transitions of Care Unit 4E- RN Case Manager See Treatment Team for direct phone #    Patient Details  Name: Blayton Huttner MRN: [id number] Date of Birth: 01-27-1946  Transition of Care [facility]) CM/SW Contact  Zenda Alpers Lenn Sink, RN Phone Number: 02/12/2020, 1:02 PM  Clinical Narrative:    Received msg from [facility] that pt/family have made decision to return home with hospice. Spoke with wife and daughter at the bedside to confirm. Pt has been active with [facility] for PC needs- family would like to continue services with them changing over to hospice. Discussed DME- pt has hospital bed, rw and other DME in the home, w/c is on order with [employer]. Plan will be to transport via EMS on discharge - will need GOLD DNR.   Call made to Cheri with [facility] for [facility] referral. - She will f/u with family and follow for readiness to return home once approved for home hospice.    Expected Discharge Plan: Skilled Nursing Facility Barriers to Discharge: Continued Medical Work up  Expected Discharge Plan and Services Expected Discharge Plan: Skilled Nursing Facility In-house Referral: Clinical Social Work Discharge Planning Services: CM Consult Post Acute Care Choice: Hospice Living arrangements for the past 2 months: Single Family Home                             HH Agency: [facility] Date HH Agency Contacted: 02/12/20 Time HH Agency Contacted: 1301 Representative spoke with at [facility] Agency: Cheri   Social Determinants of Health (SDOH) Interventions    Readmission Risk Interventions No flowsheet data found.

## 2020-02-12 NOTE — Progress Notes (Signed)
PROGRESS NOTE    Gary Butler  [id number] DOB: Dec 07, 1945 DOA: 01/30/2020 PCP: [name], MD    Brief Narrative:  74/M chronically illwith history significant ofOSA; psoriatic arthritis; morbid obesity (BMI 40); HTN; DM; BPH, chronic diastolic CHF (0000000 echo with preserved EF and grade 2 diastolic dysfunction) presentedwith nausea and anorexia. Pt was found to have gangrenous cholecystitis and underwent lap chole on 12/21. Course was complicated by worsening thrombocytopenia, afib RVR and later acute CVA  Assessment & Plan:   Principal Problem:   Sepsis due to undetermined organism [facility]) Active Problems:   OSA on CPAP   Hyperlipidemia   BPH (benign prostatic hyperplasia)   Obesity, Class III, BMI 40-49.9 (morbid obesity) ([place])   Essential hypertension   Uncontrolled type 2 diabetes mellitus with hyperglycemia, with long-term current use of insulin (HCC)   Perineal abscess   Chronic diastolic congestive heart failure (HCC)   Acute emphysematous cholecystitis   Cerebral embolism with cerebral infarction   Protein-calorie malnutrition, severe   Pressure injury of skin  Sepsis Gangrenous/emphysematous cholecystitis -General surgery following -Ongoing lingering symptoms for 6 to 8 months, suspect acute on chronic cholecystitis HIDA scan abnormal. Status post laparoscopic cholecystectomy on 02/02/2020 by Dr. Grandville Silos.  -Pt completed course of IV abx per general surgery.  Surgery has since signed off -Seen by SLP and cleared for diet. Anticipate continuing diet once able to tolerate  Recent perirectal abscess  -With perirectal thickening on CT  A. fib with RVR Chronic diastolic CHF Due to persistent A. fib with RVR early morning of 02/06/2020, cardiology was reconsulted.   -Patient was continued on digoxin and Cardizem.   -Rapid response was called on the evening of 02/07/2020 due to low blood pressure and some lethargy.   -Cardiology started him on IV  amiodarone and continued him on digoxin and cardizem was stopped.   -HR remains suboptimally controlled but is better -Was on eliquis, however, anticoagulation continues to be held given below worsening thrombocytopenia   Type 2 diabetes mellitus -Jardiance remains on hold, patient is continued on SSI coverage  Mild dementia/cognitive deficits: Mentation seems much better this AM. Pt is more appropriately conversant  Abnormal chest x-ray suspecting possible infiltrates: Earlier repeat chest x-ray noted to be unremarkable  Morbid obesity Obstructive sleep apnea -Has lost 100 pounds and needs continued weight loss -Has not been using BiPAP/CPAP following weight loss  Goals of care DO NOT RESUSCITATE -Rapid decline noted since surgery -Patient's HR remains poorly controlled and now is hemodynamically unstable despite IVF boluses -Palliative Care following. Appreciate input. Plan for d/c to home with hospice noted -Pt is DNR  Deconditioning: Seen by PT OT earlier with recs for SNF noted  Vertigo: Continue meclizine as recommended by PT.  His symptoms are likely secondary to BPPV.   Fluid/volume overload: Patient has +2 pitting edema bilateral lower extremity which is likely due to hypoalbuminemia. Cardiology had been following. Given multiple doses of IV albumin. Per Cardiology,possible attempt at diuresis if HR improves  Possible peripheral neuropathy: Patient is very tender to touch on both lower extremities.  Likely undiagnosed peripheral neuropathy in a patient who already has diabetes.  Hypomagnesemia/hypokalemia:  -Potassium and Mg remains suboptimal -Replaced  Acute CVA confirmed:  -Recent acute neurologic changes noted -MRI brain reviewed, findings worrisome for acute R centrum semiovale CVA -Neurology consulted and was following, since signed off -Discussed with Cardiology and Neurology. Given worsening thrombocytopenia, plan to hold anticoagulation until  platelets improve however plts cont to trend down  Thrombocytopenia -Uncertain etiology, noted to have gradually trended down since initial visit while pt was on eliquis -Suspect may be related to presenting sepsis -Earlier discussed with Neurology and Cardiology. Given thrombocytopenia, anticoagulation remains on hold. Family aware of increased risk for new or worsened stroke without anticoagulation  DVT prophylaxis: SCD's Code Status: DNR Family Communication: Pt in room, family is currently at bedside  Status is: Inpatient  Remains inpatient appropriate because:Hemodynamically unstable, Unsafe d/c plan, IV treatments appropriate due to intensity of illness or inability to take PO and Inpatient level of care appropriate due to severity of illness   Dispo: The patient is from: Home              Anticipated d/c is to: Unclear at this time              Anticipated d/c date is: > 3 days              Patient currently is not medically stable to d/c.  Consultants:   General Surgery  Cardiology  Neurology  PCCM  Procedures:   Lap chole  Antimicrobials: Anti-infectives (From admission, onward)   Start     Dose/Rate Route Frequency Ordered Stop   01/31/20 1400  ceFEPIme (MAXIPIME) 2 g in sodium chloride 0.9 % 100 mL IVPB       "And" Linked Group Details   2 g 200 mL/hr over 30 Minutes Intravenous Every 8 hours 01/31/20 1158 02/04/20 2359   01/31/20 1245  metroNIDAZOLE (FLAGYL) IVPB 500 mg       "And" Linked Group Details   500 mg 100 mL/hr over 60 Minutes Intravenous Every 8 hours 01/31/20 1158 02/04/20 2359   01/30/20 1445  ceFEPIme (MAXIPIME) 2 g in sodium chloride 0.9 % 100 mL IVPB       "And" Linked Group Details   2 g 200 mL/hr over 30 Minutes Intravenous  Once 01/30/20 1438 01/30/20 1856   01/30/20 1445  metroNIDAZOLE (FLAGYL) IVPB 500 mg       "And" Linked Group Details   500 mg 100 mL/hr over 60 Minutes Intravenous  Once 01/30/20 1438 01/30/20 1856       Subjective: More awake and conversant this AM. Complaining of wanting Coretrak removed  Objective: Vitals:   02/12/20 0400 02/12/20 0422 02/12/20 0804 02/12/20 1119  BP: (!) 95/58 97/61 (!) 93/58 97/65  Pulse: (!) 109 (!) 105 (!) 112 (!) 116  Resp: (!) 23 14 18 17   Temp: 97.9 F (36.6 C) 97.8 F (36.6 C) 97.7 F (36.5 C) 98.4 F (36.9 C)  TempSrc: Axillary Oral Oral Axillary  SpO2: 100% 99% 100% 97%  Weight:      Height:        Intake/Output Summary (Last 24 hours) at 02/12/2020 1520 Last data filed at 02/12/2020 02/14/2020 Gross per 24 hour  Intake 1671.15 ml  Output 400 ml  Net 1271.15 ml   Filed Weights   02/09/20 0448 02/10/20 0355 02/11/20 0420  Weight: 125 kg 125 kg 127.6 kg    Examination: General exam: Conversant, in no acute distress Respiratory system: normal chest rise, clear, no audible wheezing Cardiovascular system: regular rhythm, s1-s2 Gastrointestinal system: Nondistended, nontender, pos BS Central nervous system: No seizures, no tremors Extremities: No cyanosis, no joint deformities Skin: No rashes, no pallor Psychiatry: Affect normal // no auditory hallucinations   Data Reviewed: I have personally reviewed following labs and imaging studies  CBC: Recent Labs  Lab 02/08/20 0043 02/09/20 0144 02/10/20 0230  02/11/20 0834 02/11/20 1525 02/12/20 0612  WBC 3.3* 3.9* 3.4* 3.6*  --  2.8*  NEUTROABS 2.2 2.8 2.4 2.5  --  1.9  HGB 12.8* 13.8 13.2 12.7*  --  10.8*  HCT 35.7* 39.3 36.7* 34.9*  --  31.2*  MCV 86.4 85.6 85.9 85.7  --  87.2  PLT 52* 45* 31* 29* 21* 20*   Basic Metabolic Panel: Recent Labs  Lab 02/07/20 0639 02/08/20 0043 02/09/20 0144 02/10/20 0230 02/10/20 2200 02/11/20 0555 02/11/20 1511 02/12/20 0612  NA 133* 135 133* 134*  --  134*  --  135  K 3.2* 3.1* 3.8 3.2*  --  3.3*  --  3.5  CL 102 101 101 101  --  102  --  103  CO2 19* 24 18* 21*  --  23  --  24  GLUCOSE 138* 136* 162* 152*  --  171*  --  189*  BUN 9 9 14 11    --  12  --  12  CREATININE 0.78 0.79 0.89 0.75  --  0.62  --  0.63  CALCIUM 8.1* 7.9* 8.3* 8.1*  --  8.4*  --  8.7*  MG 1.7  --  1.7 1.8  --  1.8  --  1.7  PHOS  --   --   --   --  2.0* 2.1* 2.0*  --    GFR: Estimated Creatinine Clearance: 108.6 mL/min (by C-G formula based on SCr of 0.63 mg/dL). Liver Function Tests: Recent Labs  Lab 02/08/20 0043 02/11/20 0555  AST 10* 9*  ALT 11 11  ALKPHOS 55 62  BILITOT 1.4* 0.7  PROT 3.6* 3.8*  ALBUMIN 1.8* 1.8*   No results for input(s): LIPASE, AMYLASE in the last 168 hours. No results for input(s): AMMONIA in the last 168 hours. Coagulation Profile: Recent Labs  Lab 02/11/20 1525  INR 1.3*   Cardiac Enzymes: No results for input(s): CKTOTAL, CKMB, CKMBINDEX, TROPONINI in the last 168 hours. BNP (last 3 results) No results for input(s): PROBNP in the last 8760 hours. HbA1C: Recent Labs    02/10/20 0230  HGBA1C 6.7*   CBG: Recent Labs  Lab 02/11/20 1954 02/12/20 0047 02/12/20 0411 02/12/20 0805 02/12/20 1121  GLUCAP 186* 187* 175* 176* 181*   Lipid Profile: Recent Labs    02/10/20 0230  CHOL 87  HDL 28*  LDLCALC 38  TRIG 106  CHOLHDL 3.1   Thyroid Function Tests: No results for input(s): TSH, T4TOTAL, FREET4, T3FREE, THYROIDAB in the last 72 hours. Anemia Panel: No results for input(s): VITAMINB12, FOLATE, FERRITIN, TIBC, IRON, RETICCTPCT in the last 72 hours. Sepsis Labs: Recent Labs  Lab 02/06/20 1407 02/06/20 1623 02/07/20 0639 02/11/20 1511 02/11/20 1525 02/12/20 0612  PROCALCITON  --   --  <0.10 <0.10  --  <0.10  LATICACIDVEN 1.9 1.7  --   --  0.9  --     Recent Results (from the past 240 hour(s))  Culture, blood (Routine X 2) w Reflex to ID Panel     Status: None   Collection Time: 02/06/20  2:07 PM   Specimen: BLOOD  Result Value Ref Range Status   Specimen Description BLOOD RIGHT ANTECUBITAL  Final   Special Requests AEROBIC BOTTLE ONLY Blood Culture adequate volume  Final   Culture    Final    NO GROWTH 5 DAYS Performed at [facility], [address], [place], [place] [address]    Report Status 02/11/2020 FINAL  Final  Culture, blood (Routine X 2) w Reflex to ID Panel     Status: None   Collection Time: 02/06/20  2:22 PM   Specimen: BLOOD  Result Value Ref Range Status   Specimen Description BLOOD BLOOD RIGHT HAND  Final   Special Requests   Final    BOTTLES DRAWN AEROBIC AND ANAEROBIC Blood Culture adequate volume   Culture   Final    NO GROWTH 5 DAYS Performed at [facility], [address] [place], [place] [address]    Report Status 02/11/2020 FINAL  Final     Radiology Studies: DG Chest Port 1 View  Result Date: 02/10/2020 CLINICAL DATA:  Chest pain EXAM: PORTABLE CHEST 1 VIEW COMPARISON:  None. FINDINGS: The heart size and mediastinal contours are within normal limits. Tip of the OG tube is seen off the distal esophagus. A right-sided PICC seen with the tip in the superior cavoatrial junction. Small bilateral pleural effusions are seen. The visualized skeletal structures are unremarkable. IMPRESSION: Tip the NG tube seen within the distal esophagus. Small bilateral pleural effusions. Electronically Signed   By: Prudencio Pair M.D.   On: 02/10/2020 19:16   [place] EKG SITE RITE  Result Date: 02/10/2020 If Site Rite image not attached, placement could not be confirmed due to current cardiac rhythm.   Scheduled Meds: . atorvastatin  40 mg Oral Daily  . Chlorhexidine Gluconate Cloth  6 each Topical Daily  . feeding supplement (PROSource TF)  45 mL Per Tube BID  . insulin aspart  0-15 Units Subcutaneous Q4H  . mouth rinse  15 mL Mouth Rinse BID  . midodrine  10 mg Per Tube TID WC  . nystatin   Topical BID  . sodium chloride flush  10-40 mL Intracatheter Q12H  . sodium chloride flush  3 mL Intravenous Q12H  . tamsulosin  0.4 mg Oral Daily   Continuous Infusions: . sodium chloride 250 mL (02/02/20 2328)  . albumin human 12.5 g (02/12/20 0948)  .  amiodarone 30 mg/hr (02/12/20 0948)  . feeding supplement (OSMOLITE 1.5 CAL) 1,000 mL (02/12/20 0412)     LOS: 13 days   Marylu Lund, MD [facility] Pager On Amion  If 7PM-7AM, please contact night-coverage 02/12/2020, 3:20 PM

## 2020-02-12 NOTE — Progress Notes (Signed)
Daily Progress Note   Patient Name: Gary Butler       Date: 02/12/2020 DOB: [birth year]  Age: 74 y.o. MRN#: [id number] Attending Physician: Jerald Kief, MD Primary Care Physician: Sheliah Hatch, MD Admit Date: 01/30/2020  Reason for Consultation/Follow-up: Establishing goals of care  Subjective: More awake and interactive, uncomfortable in bed, wants feeding tube removed, c/o weeping extremities  Length of Stay: 13  Current Medications: Scheduled Meds:  . atorvastatin  40 mg Oral Daily  . Chlorhexidine Gluconate Cloth  6 each Topical Daily  . feeding supplement (PROSource TF)  45 mL Per Tube BID  . insulin aspart  0-15 Units Subcutaneous Q4H  . mouth rinse  15 mL Mouth Rinse BID  . midodrine  10 mg Per Tube TID WC  . nystatin   Topical BID  . potassium chloride  30 mEq Oral Q1 Hr x 3  . sodium chloride flush  10-40 mL Intracatheter Q12H  . sodium chloride flush  3 mL Intravenous Q12H  . tamsulosin  0.4 mg Oral Daily    Continuous Infusions: . sodium chloride 250 mL (02/02/20 2328)  . albumin human 12.5 g (02/12/20 0948)  . amiodarone 30 mg/hr (02/12/20 0948)  . feeding supplement (OSMOLITE 1.5 CAL) 1,000 mL (02/12/20 0412)    PRN Meds: sodium chloride, acetaminophen **OR** acetaminophen, sodium chloride flush  Physical Exam Constitutional:      General: He is not in acute distress. Cardiovascular:     Rate and Rhythm: Tachycardia present. Rhythm irregular.  Pulmonary:     Effort: Pulmonary effort is normal.  Abdominal:     Palpations: Abdomen is soft.  Skin:    General: Skin is warm and dry.  Neurological:     Mental Status: He is alert and oriented to person, place, and time.             Vital Signs: BP 97/65 (BP Location: Left Arm)   Pulse (!) 116   Temp 98.4 F  (36.9 C) (Axillary)   Resp 17   Ht 5\' 11"  (1.803 m)   Wt 127.6 kg   SpO2 97%   BMI 39.23 kg/m  SpO2: SpO2: 97 % O2 Device: O2 Device: Room Air O2 Flow Rate: O2 Flow Rate (L/min): 2 L/min  Intake/output summary:   Intake/Output Summary (Last 24 hours) at 02/12/2020 1223 Last data filed at 02/12/2020 02/14/2020 Gross per 24 hour  Intake 2916.79 ml  Output 400 ml  Net 2516.79 ml   LBM: Last BM Date: 02/11/20 Baseline Weight: Weight: 131.5 kg Most recent weight: Weight: 127.6 kg       Palliative Assessment/Data:    Flowsheet Rows   Flowsheet Row Most Recent Value  Intake Tab   Referral Department Hospitalist  Unit at Time of Referral Intermediate Care Unit  Palliative Care Primary Diagnosis Neurology  Date Notified 02/11/20  Palliative Care Type New Palliative care  Reason Not Seen Consult cancelled  Reason for referral Clarify Goals of Care  Date of Admission 01/30/20  Date first seen by Palliative Care 02/11/20  # of days Palliative referral response time 0 Day(s)  # of days IP prior to Palliative referral 1  Clinical Assessment   Palliative  Performance Scale Score 30%  Psychosocial & Spiritual Assessment   Palliative Care Outcomes       Patient Active Problem List   Diagnosis Date Noted  . Cerebral embolism with cerebral infarction 02/10/2020  . Protein-calorie malnutrition, severe 02/10/2020  . Pressure injury of skin 02/10/2020  . Acute emphysematous cholecystitis 01/30/2020  . Sepsis due to undetermined organism ([place]) 01/30/2020  . Chronic diastolic congestive heart failure ([place]) 11/29/2019  . Bilateral primary osteoarthritis of knee 11/23/2019  . Hypercoagulable state due to atrial fibrillation ([place]) 11/23/2019  . Physical deconditioning 11/23/2019  . Chronic gout of multiple sites 11/23/2019  . Hematochezia 11/22/2019  . Lower GI bleed 11/22/2019  . Diverticulosis   . Acute urinary retention s/p Foley 11/10/2019 11/10/2019  . Suprasphincteric perirectal  abscess s/p I&D 11/10/2019 11/10/2019  . Diarrhea 11/10/2019  . Perineal abscess 11/10/2019  . GERD without esophagitis 11/10/2019  . Atrial fibrillation ([place]) 11/10/2019  . Poorly controlled diabetes mellitus ([place]) 11/09/2019  . Dyspnea on exertion 02/19/2017  . Bilateral lower extremity edema 07/06/2015  . CHF (congestive heart failure) ([place]) 06/27/2015  . Fungal dermatitis 06/27/2015  . Edema 06/20/2015  . Increasing shortness of breath 06/20/2015  . Lumbar radiculopathy, acute 03/09/2015  . Essential hypertension 12/27/2014  . Uncontrolled type 2 diabetes mellitus with hyperglycemia, with long-term current use of insulin ([place]) 12/27/2014  . Mild aortic stenosis 06/28/2014  . Memory loss 04/29/2014  . Tinnitus of both ears 02/17/2014  . Wellness examination 12/04/2013  . Constipation due to slow transit 08/18/2013  . S/P left TKA 06/22/2013  . Meniere disease 04/21/2013  . Benign paroxysmal positional vertigo 04/21/2013  . History of methicillin resistant staphylococcus aureus (MRSA) 2015  . Psoriatic arthritis ([place]) 05/14/2012  . General medical exam 01/28/2012  . Cellulitis of eyelid 09/25/2011  . Diverticulitis 06/20/2011  . Erythrocytosis 05/18/2011  . S/P TKR (total knee replacement) 12/31/2010  . Obesity, Class III, BMI 40-49.9 (morbid obesity) ([place]) 12/31/2010  . Hyperlipidemia 12/22/2010  . BPH (benign prostatic hyperplasia) 12/22/2010  . Gout 12/22/2010  . OSA on CPAP 04/04/2009    Palliative Care Assessment & Plan   HPI: 74 y.o. male  with past medical history of mild dementia, OSA, psoriatic arthritis, morbid obesity (BMI 40), HTN, DM, BPH, and chronic diastolic CHF admitted on [id number] with nausea and anorexia. Found to have sepsis r/t gangrenous cholecystitis and underwent lap chole 12/21. Course complicated with worsening thrombocytopenia, a fib RVR, and then acute CVA.  PMT consulted to discuss [place].  Assessment: Family meeting with to include patient,  son, daughter, and wife to discuss diagnosis prognosis, [place], EOL wishes, disposition and options.  I introduced Palliative Medicine as specialized medical care for people living with serious illness. It focuses on providing relief from the symptoms and stress of a serious illness. The goal is to improve quality of life for both the patient and the family.  We discussed a brief life review of the patient. 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   As far as functional and nutritional status, they tell me patient has been mostly bed bound recently. They tell me about poor appetite. They also share about short term memory loss.    We discussed patient's current illness and what it means in the larger context of patient's on-going co-morbidities.  Natural disease trajectory and expectations at EOL were discussed. Discuss his cholecystitis, sepsis, a fib, thrombocytopenia, hypotension, and stroke. Discuss complications  of managing each condition because of how it affects other conditions.   I attempted to elicit values and goals of care important to the patient. Patient shares that he does not want to suffer. He would like medical team to focus on his quality of life. He shares that he is okay with continuing current interventions - hopeful to get cortrak out ASAP. He hopes to avoid further hospitalizations.     The difference between aggressive medical intervention and comfort care was considered in light of the patient's goals of care. Family and patient all agree they would like to transition medical care to focus on his comfort and avoid aggressive interventions.   Discussed with patient/family the importance of continued conversation with family and the medical providers regarding overall plan of care and treatment options, ensuring decisions are within the context of the patient's values and GOCs.    Hospice and Palliative Care  services outpatient were explained and offered. Discussed philosophy of support provided by hospice. They are familiar. They are agreeable to hospice support at home.   Questions and concerns were addressed. The family was encouraged to call with questions or concerns.   Recommendations/Plan: Continue current hospitalization with hopes of removing cortrak ASAP Home with hospice Will follow  Goals of Care and Additional Recommendations: Limitations on Scope of Treatment: Avoid Hospitalization  Code Status: DNR  Prognosis:  < 6 months  Discharge Planning: Home with Hospice  Care plan was discussed with patient, son, daughter, wife, Dr. Wyline Copas, RN  Thank you for allowing the Palliative Medicine Team to assist in the care of this patient.   Total Time 75 minutes Prolonged Time Billed  yes       Greater than 50%  of this time was spent counseling and coordinating care related to the above assessment and plan.  Juel Burrow, DNP, [facility] Palliative Medicine Team Team Phone # [phone]  Pager [phone]

## 2020-02-12 NOTE — Progress Notes (Signed)
Progress Note  Patient Name: Dashan Chizmar Date of Encounter: 02/12/2020  [facility] Cardiologist: Nicki Guadalajara, MD  Subjective   Patient awake and alert, feeling improved. Mental status improved since yesterday. Asking for food. Only complaint is dizziness.   Inpatient Medications    Scheduled Meds: . atorvastatin  40 mg Oral Daily  . Chlorhexidine Gluconate Cloth  6 each Topical Daily  . feeding supplement (PROSource TF)  45 mL Per Tube BID  . insulin aspart  0-15 Units Subcutaneous Q4H  . mouth rinse  15 mL Mouth Rinse BID  . midodrine  10 mg Per Tube TID WC  . nystatin   Topical BID  . sodium chloride flush  10-40 mL Intracatheter Q12H  . sodium chloride flush  3 mL Intravenous Q12H  . tamsulosin  0.4 mg Oral Daily   Continuous Infusions: . sodium chloride 250 mL (02/02/20 2328)  . albumin human 12.5 g (02/12/20 0409)  . amiodarone 30 mg/hr (02/11/20 2129)  . feeding supplement (OSMOLITE 1.5 CAL) 1,000 mL (02/12/20 0412)   PRN Meds: sodium chloride, acetaminophen **OR** acetaminophen, sodium chloride flush   Vital Signs    Vitals:   02/12/20 0043 02/12/20 0400 02/12/20 0422 02/12/20 0804  BP: (!) 89/55 (!) 95/58 97/61 (!) 93/58  Pulse: (!) 122 (!) 109 (!) 105 (!) 112  Resp: 18 (!) 23 14 18   Temp: 97.7 F (36.5 C) 97.9 F (36.6 C) 97.8 F (36.6 C) 97.7 F (36.5 C)  TempSrc: Oral Axillary Oral Oral  SpO2: 100% 100% 99% 100%  Weight:      Height:        Intake/Output Summary (Last 24 hours) at 02/12/2020 02/14/2020 Last data filed at 02/12/2020 02/14/2020 Gross per 24 hour  Intake 2916.79 ml  Output 400 ml  Net 2516.79 ml   Last 3 Weights 02/11/2020 02/10/2020 02/09/2020  Weight (lbs) 281 lb 4.9 oz 275 lb 9.2 oz 275 lb 9.2 oz  Weight (kg) 127.6 kg 125 kg 125 kg      Telemetry    Atrial fibrillation with RVR - Personally Reviewed  ECG    None new - Personally Reviewed  Physical Exam   GEN: Well nourished, well developed, obese HEENT: normal  Neck:  no JVD, difficult to assess due to obesity Cardiac: irregluar; no murmurs, rubs, or gallops, 2-3+ edema Respiratory:  clear to auscultation bilaterally, normal work of breathing GI: soft, nontender, nondistended, + BS MS: no deformity or atrophy  Skin: warm and dry Neuro:  Strength and sensation are intact Psych: euthymic mood, full affect  Labs    High Sensitivity Troponin:   Recent Labs  Lab 01/30/20 1246 01/30/20 1640  TROPONINIHS 28* 29*      Chemistry Recent Labs  Lab 02/08/20 0043 02/09/20 0144 02/10/20 0230 02/11/20 0555 02/12/20 0612  NA 135   < > 134* 134* 135  K 3.1*   < > 3.2* 3.3* 3.5  CL 101   < > 101 102 103  CO2 24   < > 21* 23 24  GLUCOSE 136*   < > 152* 171* 189*  BUN 9   < > 11 12 12   CREATININE 0.79   < > 0.75 0.62 0.63  CALCIUM 7.9*   < > 8.1* 8.4* 8.7*  PROT 3.6*  --   --  3.8*  --   ALBUMIN 1.8*  --   --  1.8*  --   AST 10*  --   --  9*  --  ALT 11  --   --  11  --   ALKPHOS 55  --   --  62  --   BILITOT 1.4*  --   --  0.7  --   GFRNONAA >60   < > >60 >60 >60  ANIONGAP 10   < > 12 9 8    < > = values in this interval not displayed.    Magnesium  Date Value Ref Range Status  02/12/2020 1.7 1.7 - 2.4 mg/dL Final    Comment:    Performed at [facility], [place] [address], [place], [place] [address]  02/11/2020 1.8 1.7 - 2.4 mg/dL Final    Comment:    Performed at [facility] [address] [place], [place] [address]  02/10/2020 1.8 1.7 - 2.4 mg/dL Final    Comment:    Performed at [facility] [address], [place], [place] [address]  02/09/2020 1.7 1.7 - 2.4 mg/dL Final    Comment:    Performed at [facility] [address] [place], [place] [address]     Hematology Recent Labs  Lab 02/10/20 0230 02/11/20 0834 02/11/20 1525 02/12/20 0612  WBC 3.4* 3.6*  --  2.8*  RBC 4.27 4.07*  --  3.58*  HGB 13.2 12.7*  --  10.8*  HCT 36.7* 34.9*  --  31.2*  MCV 85.9 85.7  --  87.2  MCH 30.9 31.2  --  30.2   MCHC 36.0 36.4*  --  34.6  RDW 16.1* 15.9*  --  15.9*  PLT 31* 29* 21* 20*    BNP Recent Labs  Lab 02/06/20 1407  BNP 218.4*     DDimer  Recent Labs  Lab 02/11/20 1525  DDIMER 1.37*     Radiology    DG Chest Port 1 View  Result Date: 02/10/2020 CLINICAL DATA:  Chest pain EXAM: PORTABLE CHEST 1 VIEW COMPARISON:  None. FINDINGS: The heart size and mediastinal contours are within normal limits. Tip of the OG tube is seen off the distal esophagus. A right-sided PICC seen with the tip in the superior cavoatrial junction. Small bilateral pleural effusions are seen. The visualized skeletal structures are unremarkable. IMPRESSION: Tip the NG tube seen within the distal esophagus. Small bilateral pleural effusions. Electronically Signed   By: Prudencio Pair M.D.   On: 02/10/2020 19:16   DG Abd Portable 1V  Result Date: 02/10/2020 CLINICAL DATA:  Feeding tube placement. EXAM: PORTABLE ABDOMEN - 1 VIEW COMPARISON:  None. FINDINGS: The bowel gas pattern is normal. Distal tip of feeding tube is seen in expected position of distal stomach. No radio-opaque calculi or other significant radiographic abnormality are seen. IMPRESSION: Distal tip of feeding tube seen in expected position of distal stomach. Electronically Signed   By: Marijo Conception M.D.   On: 02/10/2020 14:26   ECHOCARDIOGRAM COMPLETE  Result Date: 02/10/2020    ECHOCARDIOGRAM REPORT   Patient Name:   IDRIS RAINONE  Date of Exam: 02/10/2020 Medical Rec #:  [id number]  Height:       71.0 in Accession #:    [id number] Weight:       275.6 lb Date of Birth:  Apr 07, 1945  BSA:          2.416 m Patient Age:    79 years   BP:           108/69 mmHg Patient Gender: M  HR:           115 bpm. Exam Location:  Inpatient Procedure: 2D Echo Indications:    Stroke 434.91 / I163.9  History:        Patient has prior history of Echocardiogram examinations, most                 recent 02/07/2020. CHF, Arrythmias:Atrial Fibrillation; Risk                  Factors:Diabetes, Hypertension and Sleep Apnea.  Sonographer:    Darlina Sicilian RDCS Referring Phys: [id number] ASHISH ARORA  Sonographer Comments: Technically challenging study due to limited acoustic windows, suboptimal parasternal window, suboptimal apical window and suboptimal subcostal window. Patient declined the use of definity. IMPRESSIONS Extremely limited study, poor visualization of LV/RV and patient declined contrast. If concern for embolic source of CVA, recommend TEE for further evaluation FINDINGS  Left Ventricle: Left ventricular ejection fraction is not well visualized. The left ventricle has not well visualized function. Left ventricular endocardial border not optimally defined to evaluate regional wall motion. The left ventricular internal cavity size was not well visualized. There is not well visualized left ventricular hypertrophy. Left ventricular diastolic parameters are indeterminate. Right Ventricle: The right ventricular size is not well visualized. Right vetricular wall thickness was not well visualized. Right ventricular systolic function was not well visualized. Left Atrium: Left atrial size was not well visualized. Right Atrium: Right atrial size was not well visualized. Pericardium: The pericardium was not well visualized. Mitral Valve: The mitral valve was not well visualized. Not well visualized mitral valve regurgitation. Tricuspid Valve: The tricuspid valve is not well visualized. Tricuspid valve regurgitation not well visualized. Aortic Valve: The aortic valve was not well visualized. Aortic valve regurgitation not well visualized. Pulmonic Valve: The pulmonic valve was not well visualized. Pulmonic valve regurgitation not well visualized. Aorta: The aortic root was not well visualized. IAS/Shunts: The interatrial septum was not well visualized. Oswaldo Milian MD Electronically signed by Oswaldo Milian MD Signature Date/Time: 02/10/2020/7:21:33 PM    Final    [place] EKG  SITE RITE  Result Date: 02/10/2020 If Site Rite image not attached, placement could not be confirmed due to current cardiac rhythm.   Cardiac Studies   Limited echo 02/07/20 1. Very limited study quality, LVEF determined by Definity echocontrast and appears normal 55-60%, right ventricle is not visualized.  TTE 11/11/2019 1. Left ventricular ejection fraction, by estimation, is 60 to 65%. The  left ventricle has normal function. The left ventricle has no regional  wall motion abnormalities. Left ventricular diastolic parameters are  consistent with Grade II diastolic  dysfunction (pseudonormalization).  2. Right ventricular systolic function is normal. The right ventricular  size is normal.  3. The mitral valve is normal in structure. No evidence of mitral valve regurgitation. No evidence of mitral stenosis.  4. The aortic valve is normal in structure. Aortic valve regurgitation is not visualized. No aortic stenosis is present.  5. The inferior vena cava is normal in size with greater than 50%  respiratory variability, suggesting right atrial pressure of 3 mmHg.   Patient Profile     74 y.o. male with diabetes, hypertension, A. fib, HFpEF, obesity who was admitted on 01/30/2020 for nausea/vomiting and found to have gangrenous cholecystitis.  Cardiology initially consulted for preoperative assessment. Course was complicated by worsening thrombocytopenia, afib RVR and acute CVA 12/28.   Cardiology was reconsulted on 02/06/2020 for A. fib with RVR and low blood pressure.  Assessment & Plan    A. fib with RVR Hypotension Currently on amiodarone with HR 100-115 on telemetry. Hypotension limiting other medications. Would continue amiodarone for now. Platelets 20 this morning so holding on anticoagulation  Volume overloaded Elevated BNP/low albumin Acute on chronic diastolic heart failure Normal EF on TTE 12/26. Continues volume overload but hypotension limiting ability to  diurese. Once diuresis is possible, AF [place] Every be easier to control. 10L positive.  Hypokalemia: K 3.5. Emmersyn Kratzke replete if he is able to take oral.  Hypomagnesemia Mg 1.7. Kamiya Acord replete  Type II diabetes Per IM  Thrombocytopenia Platelets low today. Holding anticoagulation.  Hx DNR Palliative plans for family meeting today.   For questions or updates, please contact [place] Please consult [email or web address] for contact info under     Signed, Terre Hanneman Meredith Leeds, MD  02/12/2020, 9:08 AM

## 2020-02-12 NOTE — Significant Event (Signed)
Rapid Response Event Note   Reason for Call :  Difficulty speaking Initial Focused Assessment:  Per RN LKW about 1600.  When mobility tech entered room about 1615 he was not making sense when he spoke. Patient's code status is DNR with plans to DC to hospice. Patient with Platelet count of 20 CBG 159  BP 96/58  AF 120  RR 20  O2 sat 100% on RA  Patient with global aphasia, right arm weakness and left gaze preference.     Interventions:  Stat head CT 250 cc NS bolus  Plan of Care:     Event Summary:   MD Notified: Dr Rhona Leavens Call Time: 1626 Arrival Time: 1628 End Time:  1645  Marcellina Millin, RN

## 2020-02-12 NOTE — Progress Notes (Signed)
Cortrac discontinued w/o difficulty.  Tube intact. Patient appreciative.

## 2020-02-12 NOTE — Evaluation (Signed)
Speech Language Pathology Evaluation Patient Details Name: Gary Butler MRN: [id number] DOB: April 02, 1945 Today's Date: 02/12/2020 Time: 0900-0930 SLP Time Calculation (min) (ACUTE ONLY): 30 min  Problem List:  Patient Active Problem List   Diagnosis Date Noted  . Cerebral embolism with cerebral infarction 02/10/2020  . Protein-calorie malnutrition, severe 02/10/2020  . Pressure injury of skin 02/10/2020  . Acute emphysematous cholecystitis 01/30/2020  . Sepsis due to undetermined organism (HCC) 01/30/2020  . Chronic diastolic congestive heart failure (HCC) 11/29/2019  . Bilateral primary osteoarthritis of knee 11/23/2019  . Hypercoagulable state due to atrial fibrillation (HCC) 11/23/2019  . Physical deconditioning 11/23/2019  . Chronic gout of multiple sites 11/23/2019  . Hematochezia 11/22/2019  . Lower GI bleed 11/22/2019  . Diverticulosis   . Acute urinary retention s/p Foley 11/10/2019 11/10/2019  . Suprasphincteric perirectal abscess s/p I&D 11/10/2019 11/10/2019  . Diarrhea 11/10/2019  . Perineal abscess 11/10/2019  . GERD without esophagitis 11/10/2019  . Atrial fibrillation (HCC) 11/10/2019  . Poorly controlled diabetes mellitus (HCC) 11/09/2019  . Dyspnea on exertion 02/19/2017  . Bilateral lower extremity edema 07/06/2015  . CHF (congestive heart failure) (HCC) 06/27/2015  . Fungal dermatitis 06/27/2015  . Edema 06/20/2015  . Increasing shortness of breath 06/20/2015  . Lumbar radiculopathy, acute 03/09/2015  . Essential hypertension 12/27/2014  . Uncontrolled type 2 diabetes mellitus with hyperglycemia, with long-term current use of insulin (HCC) 12/27/2014  . Mild aortic stenosis 06/28/2014  . Memory loss 04/29/2014  . Tinnitus of both ears 02/17/2014  . Wellness examination 12/04/2013  . Constipation due to slow transit 08/18/2013  . S/P left TKA 06/22/2013  . Meniere disease 04/21/2013  . Benign paroxysmal positional vertigo 04/21/2013  . History of methicillin  resistant staphylococcus aureus (MRSA) 2015  . Psoriatic arthritis (HCC) 05/14/2012  . General medical exam 01/28/2012  . Cellulitis of eyelid 09/25/2011  . Diverticulitis 06/20/2011  . Erythrocytosis 05/18/2011  . S/P TKR (total knee replacement) 12/31/2010  . Obesity, Class III, BMI 40-49.9 (morbid obesity) (HCC) 12/31/2010  . Hyperlipidemia 12/22/2010  . BPH (benign prostatic hyperplasia) 12/22/2010  . Gout 12/22/2010  . OSA on CPAP 04/04/2009   Past Medical History:  Past Medical History:  Diagnosis Date  . Arthritis   . CHF (congestive heart failure) (HCC)   . Constipation   . Diabetes mellitus   . Heart murmur   . History of gout   . History of skin cancer   . Hypertension   . Morbid (severe) obesity due to excess calories (HCC)   . OSA (obstructive sleep apnea)   . Psoriatic arthritis (HCC)   . Rectal fissure   . Tinnitus    Past Surgical History:  Past Surgical History:  Procedure Laterality Date  . addenoids    . ANAL FISTULOTOMY  2019   Dr Drue Dun, [place]lyann [place]  . CHOLECYSTECTOMY N/A 02/02/2020   Procedure: LAPAROSCOPIC CHOLECYSTECTOMY;  Surgeon: Violeta Gelinas, MD;  Location: [facility] OR;  Service: General;  Laterality: N/A;  . IRRIGATION AND DEBRIDEMENT ABSCESS N/A 11/10/2019   Procedure: INCISION AND DRAINAGE OF HIGH SCROTAL ABSCESS;  Surgeon: Karie Soda, MD;  Location: [facility] ORS;  Service: General;  Laterality: N/A;  . JOINT REPLACEMENT  2012   RT KNEE  . LUNG BIOPSY     20 YRS AGO  . RECTAL EXAM UNDER ANESTHESIA N/A 11/10/2019   Procedure: ANORECTAL EXAM UNDER ANESTHESIA;  Surgeon: Karie Soda, MD;  Location: [facility] ORS;  Service: General;  Laterality: N/A;  . TONSILLECTOMY    .  TOTAL KNEE ARTHROPLASTY Bilateral 06/22/2013   Procedure: LEFT TOTAL KNEE ARTHROPLASTY;  MEDIAL SOFT TISSUE EXPLORATION SAPHENOUS NEURECTOMY RIGHT KNEE;  Surgeon: Mauri Pole, MD;  Location: [facility] ORS;  Service: Orthopedics;  Laterality: Bilateral;   HPI:  Pt is a 74 yo male who presented with  nausea and anorexia. Prior admission from 9/27-10/5 with perirectal abscess s/p I&D. He also had new onset afib and was started on Eliquis. Ongoing lingering symptoms for 6 to 8 months, suspect acute on chronic cholecystitis. S/p cholecystectomy 12/21. While working with PT 12/28 he was noted to have increased dizziness and L sided weakness. 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 PMH includes: OSA; psoriatic arthritis; morbid obesity (BMI 40); HTN; DM; BPH, chronic diastolic CHF (0000000 echo with preserved EF and grade 2 diastolic dysfunction), difficulty swallowing over the past year (coughing, gagging, bringing up white phlegm) for which he was seen by GI and had an esophagram that revealed tertiary contractions and mild esophageal dysmotility   Assessment / Plan / Recommendation Clinical Impression  Pt demonstrates mild cognitive impairment in setting of prolonged illness and recent delirium. He is certainly showing improvment in attention and orientation. Pt is asking appropriate questions to reorient himself but needs verbal reinforcement for reasoning. He is able to sustain attention to complex verbal tasks, but working memory is impaired and pt struggles with reasoning to complete tasks. His safety awareness is also improving and he was very polite and thankful for care, willing to replace mitts if reason given. Vision is impaired and pt is able to verbalize awareness of double vision and states he needs to close one eye to read. Will f/u acutely for cognititve interventions, would recommend f/u at SNF level.    SLP Assessment  SLP Recommendation/Assessment: Patient needs continued Speech Lanaguage Pathology Services SLP Visit Diagnosis: Cognitive communication deficit (R41.841)    Follow Up Recommendations  Skilled Nursing facility    Frequency and Duration min 2x/week  2 weeks      SLP Evaluation Cognition  Overall Cognitive Status: Impaired/Different from  baseline Arousal/Alertness: Awake/alert Orientation Level: Oriented to person;Oriented to place;Disoriented to time;Disoriented to situation Attention: Focused;Sustained Focused Attention: Appears intact Sustained Attention: Appears intact Memory: Impaired Memory Impairment: Decreased short term memory Awareness: Impaired Awareness Impairment: Intellectual impairment Problem Solving: Impaired Problem Solving Impairment: Verbal complex;Functional complex Executive Function: Reasoning Reasoning: Impaired Reasoning Impairment: Verbal complex Safety/Judgment: Appears intact       Comprehension  Auditory Comprehension Overall Auditory Comprehension: Appears within functional limits for tasks assessed    Expression Verbal Expression Overall Verbal Expression: Appears within functional limits for tasks assessed   Oral / Motor  Oral Motor/Sensory Function Overall Oral Motor/Sensory Function: Within functional limits Motor Speech Overall Motor Speech: Appears within functional limits for tasks assessed   GO                   Herbie Baltimore, MA CCC-SLP  Acute Rehabilitation Services Pager [phone] Office [phone]  Lynann Beaver 02/12/2020, 10:04 AM

## 2020-02-12 NOTE — Progress Notes (Signed)
Speech aphasia now resolved.  Pt c/o NG tube, requesting it be removed as discussed earlier in the day.   MD notified.   New orders:  Hold tube feedings for now.  MD to speak with family regarding NG tube.   Gary Butler

## 2020-02-13 DIAGNOSIS — Z7189 Other specified counseling: Secondary | ICD-10-CM | POA: Diagnosis not present

## 2020-02-13 DIAGNOSIS — I63111 Cerebral infarction due to embolism of right vertebral artery: Secondary | ICD-10-CM | POA: Diagnosis not present

## 2020-02-13 DIAGNOSIS — Z515 Encounter for palliative care: Secondary | ICD-10-CM | POA: Diagnosis not present

## 2020-02-13 DIAGNOSIS — I4891 Unspecified atrial fibrillation: Secondary | ICD-10-CM | POA: Diagnosis not present

## 2020-02-13 DIAGNOSIS — Z9189 Other specified personal risk factors, not elsewhere classified: Secondary | ICD-10-CM

## 2020-02-13 DIAGNOSIS — A419 Sepsis, unspecified organism: Secondary | ICD-10-CM | POA: Diagnosis not present

## 2020-02-13 LAB — CBC WITH DIFFERENTIAL/PLATELET
Abs Immature Granulocytes: 0.02 10*3/uL (ref 0.00–0.07)
Basophils Absolute: 0 10*3/uL (ref 0.0–0.1)
Basophils Relative: 1 %
Eosinophils Absolute: 0.1 10*3/uL (ref 0.0–0.5)
Eosinophils Relative: 4 %
HCT: 29.7 % — ABNORMAL LOW (ref 39.0–52.0)
Hemoglobin: 10.3 g/dL — ABNORMAL LOW (ref 13.0–17.0)
Immature Granulocytes: 1 %
Lymphocytes Relative: 31 %
Lymphs Abs: 1 10*3/uL (ref 0.7–4.0)
MCH: 30.2 pg (ref 26.0–34.0)
MCHC: 34.7 g/dL (ref 30.0–36.0)
MCV: 87.1 fL (ref 80.0–100.0)
Monocytes Absolute: 0.1 10*3/uL (ref 0.1–1.0)
Monocytes Relative: 4 %
Neutro Abs: 1.9 10*3/uL (ref 1.7–7.7)
Neutrophils Relative %: 59 %
Platelets: 19 10*3/uL — CL (ref 150–400)
RBC: 3.41 MIL/uL — ABNORMAL LOW (ref 4.22–5.81)
RDW: 16.1 % — ABNORMAL HIGH (ref 11.5–15.5)
WBC: 3.1 10*3/uL — ABNORMAL LOW (ref 4.0–10.5)
nRBC: 0 % (ref 0.0–0.2)

## 2020-02-13 LAB — GLUCOSE, CAPILLARY
Glucose-Capillary: 110 mg/dL — ABNORMAL HIGH (ref 70–99)
Glucose-Capillary: 127 mg/dL — ABNORMAL HIGH (ref 70–99)
Glucose-Capillary: 138 mg/dL — ABNORMAL HIGH (ref 70–99)
Glucose-Capillary: 140 mg/dL — ABNORMAL HIGH (ref 70–99)
Glucose-Capillary: 142 mg/dL — ABNORMAL HIGH (ref 70–99)
Glucose-Capillary: 169 mg/dL — ABNORMAL HIGH (ref 70–99)

## 2020-02-13 LAB — COMPREHENSIVE METABOLIC PANEL
ALT: 8 U/L (ref 0–44)
AST: 11 U/L — ABNORMAL LOW (ref 15–41)
Albumin: 2.3 g/dL — ABNORMAL LOW (ref 3.5–5.0)
Alkaline Phosphatase: 54 U/L (ref 38–126)
Anion gap: 8 (ref 5–15)
BUN: 8 mg/dL (ref 8–23)
CO2: 24 mmol/L (ref 22–32)
Calcium: 8.9 mg/dL (ref 8.9–10.3)
Chloride: 105 mmol/L (ref 98–111)
Creatinine, Ser: 0.56 mg/dL — ABNORMAL LOW (ref 0.61–1.24)
GFR, Estimated: >60 mL/min (ref >60)
Glucose, Bld: 141 mg/dL — ABNORMAL HIGH (ref 70–99)
Potassium: 3.8 mmol/L (ref 3.5–5.1)
Sodium: 137 mmol/L (ref 135–145)
Total Bilirubin: 0.9 mg/dL (ref 0.3–1.2)
Total Protein: 3.6 g/dL — ABNORMAL LOW (ref 6.5–8.1)

## 2020-02-13 LAB — PROCALCITONIN: Procalcitonin: <0.1 ng/mL

## 2020-02-13 LAB — MAGNESIUM: Magnesium: 1.6 mg/dL — ABNORMAL LOW (ref 1.7–2.4)

## 2020-02-13 NOTE — Progress Notes (Signed)
PROGRESS NOTE    Gary Butler  FBP:[id number] DOB: 1945/05/27 DOA: 01/30/2020 PCP: Sheliah Hatch, MD    Brief Narrative:  74/M chronically illwith history significant ofOSA; psoriatic arthritis; morbid obesity (BMI 40); HTN; DM; BPH, chronic diastolic CHF (10/2019 echo with preserved EF and grade 2 diastolic dysfunction) presentedwith nausea and anorexia. Pt was found to have gangrenous cholecystitis and underwent lap chole on 12/21. Course was complicated by worsening thrombocytopenia, afib RVR and later acute CVA  Assessment & Plan:   Principal Problem:   Sepsis due to undetermined organism [facility]) Active Problems:   OSA on CPAP   Hyperlipidemia   BPH (benign prostatic hyperplasia)   Obesity, Class III, BMI 40-49.9 (morbid obesity) (HCC)   Essential hypertension   Uncontrolled type 2 diabetes mellitus with hyperglycemia, with long-term current use of insulin (HCC)   Perineal abscess   Chronic diastolic congestive heart failure (HCC)   Acute emphysematous cholecystitis   Cerebral embolism with cerebral infarction   Protein-calorie malnutrition, severe   Pressure injury of skin  Sepsis Gangrenous/emphysematous cholecystitis -General surgery following -Ongoing lingering symptoms for 6 to 8 months, suspect acute on chronic cholecystitis HIDA scan abnormal. Status post laparoscopic cholecystectomy on 02/02/2020 by Dr. Janee Morn.  -Pt completed course of IV abx per general surgery.  Surgery has since signed off -Seen by SLP and cleared for dysphagia diet. Allow po intake as tolerated. Family is aware of risk of aspiration  Recent perirectal abscess  -With perirectal thickening on CT  A. fib with RVR Chronic diastolic CHF Due to persistent A. fib with RVR early morning of 02/06/2020, cardiology was reconsulted.   -Patient was continued on digoxin and Cardizem.   -Rapid response was called on the evening of 02/07/2020 due to low blood pressure and some lethargy.    -Cardiology started him on IV amiodarone and continued him on digoxin and cardizem was stopped.   -HR remains better controlled on amio gtt -Was on eliquis, however, anticoagulation continues to be held given below worsening thrombocytopenia  -Per cardiology, likely transition to PO amiodarone. Cardiology has since signed off  Type 2 diabetes mellitus -Jardiance remains on hold, patient is continued on SSI coverage  Mild dementia/cognitive deficits: Mentation seems stable this AM. Pt is conversant  Abnormal chest x-ray suspecting possible infiltrates: Earlier repeat chest x-ray noted to be unremarkable  Morbid obesity Obstructive sleep apnea -cont supportive care for now  Goals of care DO NOT RESUSCITATE -Rapid decline noted since surgery -Palliative Care following. On discussion with family , plan for focus on palliation and discharge to home with hospice. TOC following  Deconditioning: Seen by PT OT earlier with recs for SNF noted. Per above, plan for discharge to home with hospice at time of d/c  Vertigo: Continue meclizine as recommended by PT.  His symptoms are likely secondary to BPPV.   Fluid/volume overload: Patient has +2 pitting edema bilateral lower extremity which is likely due to hypoalbuminemia. Cardiology had been following. Given multiple doses of IV albumin  Possible peripheral neuropathy: Patient is very tender to touch on both lower extremities.  Likely undiagnosed peripheral neuropathy in a patient who already has diabetes.  Hypomagnesemia/hypokalemia:  -Potassium and Mg remains suboptimal -Replaced  Acute CVA confirmed:  -Recent acute neurologic changes noted -MRI brain reviewed, findings worrisome for acute R centrum semiovale CVA -Neurology consulted and was following, since signed off -Discussed with Cardiology and Neurology. Given worsening thrombocytopenia, plan to hold anticoagulation until platelets improve however plts cont to trend  down  Thrombocytopenia -Uncertain etiology, noted to have gradually trended down since initial visit while pt was on eliquis -Suspect may be related to presenting sepsis -Earlier discussed with Neurology and Cardiology. Given thrombocytopenia, anticoagulation remains on hold. Family aware of increased risk for new or worsened stroke without anticoagulation  DVT prophylaxis: SCD's Code Status: DNR Family Communication: Pt in room, family is currently at bedside  Status is: Inpatient  Remains inpatient appropriate because:Hemodynamically unstable, Unsafe d/c plan, IV treatments appropriate due to intensity of illness or inability to take PO and Inpatient level of care appropriate due to severity of illness   Dispo: The patient is from: Home              Anticipated d/c is to: Home with hospice              Anticipated d/c date is: 2 days              Patient currently is not medically stable to d/c.  Consultants:   General Surgery  Cardiology  Neurology  PCCM  Procedures:   Lap chole  Antimicrobials: Anti-infectives (From admission, onward)   Start     Dose/Rate Route Frequency Ordered Stop   01/31/20 1400  ceFEPIme (MAXIPIME) 2 g in sodium chloride 0.9 % 100 mL IVPB       "And" Linked Group Details   2 g 200 mL/hr over 30 Minutes Intravenous Every 8 hours 01/31/20 1158 02/04/20 2359   01/31/20 1245  metroNIDAZOLE (FLAGYL) IVPB 500 mg       "And" Linked Group Details   500 mg 100 mL/hr over 60 Minutes Intravenous Every 8 hours 01/31/20 1158 02/04/20 2359   01/30/20 1445  ceFEPIme (MAXIPIME) 2 g in sodium chloride 0.9 % 100 mL IVPB       "And" Linked Group Details   2 g 200 mL/hr over 30 Minutes Intravenous  Once 01/30/20 1438 01/30/20 1856   01/30/20 1445  metroNIDAZOLE (FLAGYL) IVPB 500 mg       "And" Linked Group Details   500 mg 100 mL/hr over 60 Minutes Intravenous  Once 01/30/20 1438 01/30/20 1856      Subjective: Awake, conversant. Not eating  well  Objective: Vitals:   02/13/20 0500 02/13/20 0822 02/13/20 0900 02/13/20 1200  BP: (!) 88/60 (!) 88/61 102/62 99/70  Pulse: 94 (!) 109 (!) 101 99  Resp: 19 15 14 18   Temp: 97.7 F (36.5 C) 97.7 F (36.5 C) 97.6 F (36.4 C) 98.1 F (36.7 C)  TempSrc: Oral Oral Oral Axillary  SpO2:  93% 94% 94%  Weight:      Height:       No intake or output data in the 24 hours ending 02/13/20 1639 Filed Weights   02/09/20 0448 02/10/20 0355 02/11/20 0420  Weight: 125 kg 125 kg 127.6 kg    Examination: General exam: Awake, laying in bed, in nad Respiratory system: Normal respiratory effort, no wheezing Cardiovascular system: regular rate, s1, s2 Gastrointestinal system: Soft, nondistended, positive BS Central nervous system: CN2-12 grossly intact, strength intact Extremities: Perfused, no clubbing Skin: Normal skin turgor, no notable skin lesions seen Psychiatry: Mood normal // no visual hallucinations    Data Reviewed: I have personally reviewed following labs and imaging studies  CBC: Recent Labs  Lab 02/09/20 0144 02/10/20 0230 02/11/20 0834 02/11/20 1525 02/12/20 0612 02/13/20 0530  WBC 3.9* 3.4* 3.6*  --  2.8* 3.1*  NEUTROABS 2.8 2.4 2.5  --  1.9 1.9  HGB  13.8 13.2 12.7*  --  10.8* 10.3*  HCT 39.3 36.7* 34.9*  --  31.2* 29.7*  MCV 85.6 85.9 85.7  --  87.2 87.1  PLT 45* 31* 29* 21* 20* 19*   Basic Metabolic Panel: Recent Labs  Lab 02/09/20 0144 02/10/20 0230 02/10/20 2200 02/11/20 0555 02/11/20 1511 02/12/20 0612 02/13/20 0530  NA 133* 134*  --  134*  --  135 137  K 3.8 3.2*  --  3.3*  --  3.5 3.8  CL 101 101  --  102  --  103 105  CO2 18* 21*  --  23  --  24 24  GLUCOSE 162* 152*  --  171*  --  189* 141*  BUN 14 11  --  12  --  12 8  CREATININE 0.89 0.75  --  0.62  --  0.63 0.56*  CALCIUM 8.3* 8.1*  --  8.4*  --  8.7* 8.9  MG 1.7 1.8  --  1.8  --  1.7 1.6*  PHOS  --   --  2.0* 2.1* 2.0*  --   --    GFR: Estimated Creatinine Clearance: 108.6 mL/min  (A) (by C-G formula based on SCr of 0.56 mg/dL (L)). Liver Function Tests: Recent Labs  Lab 02/08/20 0043 02/11/20 0555 02/13/20 0530  AST 10* 9* 11*  ALT 11 11 8   ALKPHOS 55 62 54  BILITOT 1.4* 0.7 0.9  PROT 3.6* 3.8* 3.6*  ALBUMIN 1.8* 1.8* 2.3*   No results for input(s): LIPASE, AMYLASE in the last 168 hours. No results for input(s): AMMONIA in the last 168 hours. Coagulation Profile: Recent Labs  Lab 02/11/20 1525  INR 1.3*   Cardiac Enzymes: No results for input(s): CKTOTAL, CKMB, CKMBINDEX, TROPONINI in the last 168 hours. BNP (last 3 results) No results for input(s): PROBNP in the last 8760 hours. HbA1C: No results for input(s): HGBA1C in the last 72 hours. CBG: Recent Labs  Lab 02/12/20 2040 02/12/20 2353 02/13/20 0415 02/13/20 0817 02/13/20 1300  GLUCAP 130* 138* 127* 138* 110*   Lipid Profile: No results for input(s): CHOL, HDL, LDLCALC, TRIG, CHOLHDL, LDLDIRECT in the last 72 hours. Thyroid Function Tests: No results for input(s): TSH, T4TOTAL, FREET4, T3FREE, THYROIDAB in the last 72 hours. Anemia Panel: No results for input(s): VITAMINB12, FOLATE, FERRITIN, TIBC, IRON, RETICCTPCT in the last 72 hours. Sepsis Labs: Recent Labs  Lab 02/07/20 [id number] 02/11/20 1511 02/11/20 1525 02/12/20 0612 02/13/20 0530  PROCALCITON <0.10 <0.10  --  <0.10 <0.10  LATICACIDVEN  --   --  0.9  --   --     Recent Results (from the past 240 hour(s))  Culture, blood (Routine X 2) w Reflex to ID Panel     Status: None   Collection Time: 02/06/20  2:07 PM   Specimen: BLOOD  Result Value Ref Range Status   Specimen Description BLOOD RIGHT ANTECUBITAL  Final   Special Requests AEROBIC BOTTLE ONLY Blood Culture adequate volume  Final   Culture   Final    NO GROWTH 5 DAYS Performed at [facility], [place] [address] [place], [place] [address]    Report Status 02/11/2020 FINAL  Final  Culture, blood (Routine X 2) w Reflex to ID Panel     Status: None   Collection  Time: 02/06/20  2:22 PM   Specimen: BLOOD  Result Value Ref Range Status   Specimen Description BLOOD BLOOD RIGHT HAND  Final   Special Requests  Final    BOTTLES DRAWN AEROBIC AND ANAEROBIC Blood Culture adequate volume   Culture   Final    NO GROWTH 5 DAYS Performed at [facility], [place] [address], [place], [place] [address]    Report Status 02/11/2020 FINAL  Final     Radiology Studies: CT HEAD WO CONTRAST  Result Date: 02/12/2020 CLINICAL DATA:  Stroke follow-up. EXAM: CT HEAD WITHOUT CONTRAST TECHNIQUE: Contiguous axial images were obtained from the base of the skull through the vertex without intravenous contrast. COMPARISON:  CT head 02/10/2020.  MRI head 02/09/2020 FINDINGS: Brain: Tiny acute infarct in the right corona radiata seen on diffusion-weighted imaging not identified by CT. Generalized moderate atrophy. Mild to moderate white matter changes consistent with chronic ischemia. No acute hemorrhage or mass. Vascular: Negative for hyperdense vessel. Atherosclerotic calcification right middle cerebral artery. Atherosclerotic mild calcification in the distal vertebral arteries. Skull: Negative Sinuses/Orbits: Paranasal sinuses clear.  Negative orbit. Other: None IMPRESSION: Atrophy and chronic white matter ischemia. No acute abnormality by CT and no acute hemorrhage. Electronically Signed   By: Franchot Gallo M.D.   On: 02/12/2020 17:38    Scheduled Meds:  atorvastatin  40 mg Oral Daily   Chlorhexidine Gluconate Cloth  6 each Topical Daily   feeding supplement (PROSource TF)  45 mL Per Tube BID   insulin aspart  0-15 Units Subcutaneous Q4H   mouth rinse  15 mL Mouth Rinse BID   midodrine  10 mg Per Tube TID WC   nystatin   Topical BID   sodium chloride flush  10-40 mL Intracatheter Q12H   sodium chloride flush  3 mL Intravenous Q12H   tamsulosin  0.4 mg Oral Daily   Continuous Infusions:  sodium chloride 250 mL (02/02/20 2328)   amiodarone 30 mg/hr  (02/13/20 1003)   feeding supplement (OSMOLITE 1.5 CAL) 1,000 mL (02/12/20 0412)     LOS: 14 days   Marylu Lund, MD [facility] Pager On Amion  If 7PM-7AM, please contact night-coverage 02/13/2020, 4:39 PM

## 2020-02-13 NOTE — Progress Notes (Signed)
Pt refused morning medications. Pt repeatedly stated "no, not right now." Primary RN notified. Will continue to monitor.  Hazle Nordmann, RN

## 2020-02-13 NOTE — Progress Notes (Signed)
Brief cardiology progress note:  Reviewed recent events. Family is pursuing comfort care, plans to take patient home with hospice. Given this, cardiology will sign off. Oral amiodarone unlikely to control heart rate and may cause nausea, but could trial 200 mg oral BID for two weeks, then 200 mg daily. Agree with midodrine for blood pressure support.  Please contact us with any concerns or questions. Thank you for allowing to participate in this patient's care.  Jodelle Red, MD, PhD, [facility]  [facility]  [address] [place], [place] [address] [phone]

## 2020-02-13 NOTE — Progress Notes (Addendum)
Daily Progress Note   Patient Name: Gary Butler       Date: 02/13/2020 DOB: 04/28/1945  Age: 75 y.o. MRN#: [id number] Attending Physician: Donne Hazel, MD Primary Care Physician: [name], MD Admit Date: 01/30/2020  Reason for Consultation/Follow-up: Establishing goals of care  Subjective: Less interactive today, tells me he just wants to sleep, cortrak out last night  Length of Stay: 14  Current Medications: Scheduled Meds:  . atorvastatin  40 mg Oral Daily  . Chlorhexidine Gluconate Cloth  6 each Topical Daily  . feeding supplement (PROSource TF)  45 mL Per Tube BID  . insulin aspart  0-15 Units Subcutaneous Q4H  . mouth rinse  15 mL Mouth Rinse BID  . midodrine  10 mg Per Tube TID WC  . nystatin   Topical BID  . sodium chloride flush  10-40 mL Intracatheter Q12H  . sodium chloride flush  3 mL Intravenous Q12H  . tamsulosin  0.4 mg Oral Daily    Continuous Infusions: . sodium chloride 250 mL (02/02/20 2328)  . amiodarone 30 mg/hr (02/13/20 1003)  . feeding supplement (OSMOLITE 1.5 CAL) 1,000 mL (02/12/20 0412)    PRN Meds: sodium chloride, acetaminophen **OR** acetaminophen, sodium chloride flush  Physical Exam Constitutional:      General: He is not in acute distress.    Comments: drowsy  Cardiovascular:     Rate and Rhythm: Rhythm irregular.  Pulmonary:     Effort: Pulmonary effort is normal.  Abdominal:     Palpations: Abdomen is soft.  Musculoskeletal:     Comments: Weeping, swollen extremities  Skin:    General: Skin is warm.  Neurological:     Comments: Some confusion             Vital Signs: BP 99/70 (BP Location: Left Arm)   Pulse 99   Temp 98.1 F (36.7 C) (Axillary)   Resp 18   Ht 5\' 11"  (1.803 m)   Wt 127.6 kg   SpO2 94%   BMI 39.23 kg/m   SpO2: SpO2: 94 % O2 Device: O2 Device: Room Air O2 Flow Rate: O2 Flow Rate (L/min): 2 L/min  Intake/output summary:  No intake or output data in the 24 hours ending 02/13/20 1413 LBM: Last BM Date: 02/12/20 Baseline Weight: Weight: 131.5 kg Most recent weight: Weight: 127.6 kg       Palliative Assessment/Data: PPS 20%    Flowsheet Rows   Flowsheet Row Most Recent Value  Intake Tab   Referral Department Hospitalist  Unit at Time of Referral Intermediate Care Unit  Palliative Care Primary Diagnosis Neurology  Date Notified 02/11/20  Palliative Care Type New Palliative care  Reason Not Seen Consult cancelled  Reason for referral Clarify Goals of Care  Date of Admission 01/30/20  Date first seen by Palliative Care 02/11/20  # of days Palliative referral response time 0 Day(s)  # of days IP prior to Palliative referral 1  Clinical Assessment   Palliative Performance Scale Score 30%  Psychosocial & Spiritual Assessment   Palliative Care Outcomes       Patient Active Problem List   Diagnosis Date Noted  . Cerebral embolism with cerebral infarction 02/10/2020  .  Protein-calorie malnutrition, severe 02/10/2020  . Pressure injury of skin 02/10/2020  . Acute emphysematous cholecystitis 01/30/2020  . Sepsis due to undetermined organism (HCC) 01/30/2020  . Chronic diastolic congestive heart failure (HCC) 11/29/2019  . Bilateral primary osteoarthritis of knee 11/23/2019  . Hypercoagulable state due to atrial fibrillation (HCC) 11/23/2019  . Physical deconditioning 11/23/2019  . Chronic gout of multiple sites 11/23/2019  . Hematochezia 11/22/2019  . Lower GI bleed 11/22/2019  . Diverticulosis   . Acute urinary retention s/p Foley 11/10/2019 11/10/2019  . Suprasphincteric perirectal abscess s/p I&D 11/10/2019 11/10/2019  . Diarrhea 11/10/2019  . Perineal abscess 11/10/2019  . GERD without esophagitis 11/10/2019  . Atrial fibrillation (HCC) 11/10/2019  . Poorly controlled  diabetes mellitus (HCC) 11/09/2019  . Dyspnea on exertion 02/19/2017  . Bilateral lower extremity edema 07/06/2015  . CHF (congestive heart failure) (HCC) 06/27/2015  . Fungal dermatitis 06/27/2015  . Edema 06/20/2015  . Increasing shortness of breath 06/20/2015  . Lumbar radiculopathy, acute 03/09/2015  . Essential hypertension 12/27/2014  . Uncontrolled type 2 diabetes mellitus with hyperglycemia, with long-term current use of insulin (HCC) 12/27/2014  . Mild aortic stenosis 06/28/2014  . Memory loss 04/29/2014  . Tinnitus of both ears 02/17/2014  . Wellness examination 12/04/2013  . Constipation due to slow transit 08/18/2013  . S/P left TKA 06/22/2013  . Meniere disease 04/21/2013  . Benign paroxysmal positional vertigo 04/21/2013  . History of methicillin resistant staphylococcus aureus (MRSA) 2015  . Psoriatic arthritis (HCC) 05/14/2012  . General medical exam 01/28/2012  . Cellulitis of eyelid 09/25/2011  . Diverticulitis 06/20/2011  . Erythrocytosis 05/18/2011  . S/P TKR (total knee replacement) 12/31/2010  . Obesity, Class III, BMI 40-49.9 (morbid obesity) (HCC) 12/31/2010  . Hyperlipidemia 12/22/2010  . BPH (benign prostatic hyperplasia) 12/22/2010  . Gout 12/22/2010  . OSA on CPAP 04/04/2009    Palliative Care Assessment & Plan   HPI: 75 y.o. male  with past medical history of mild dementia, OSA, psoriatic arthritis, morbid obesity (BMI 40), HTN, DM, BPH, and chronic diastolic CHF admitted on 01/30/2020 with nausea and anorexia. Found to have sepsis r/t gangrenous cholecystitis and underwent lap chole 12/21. Course complicated with worsening thrombocytopenia, a fib RVR, and then acute CVA.  PMT consulted to discuss GOC.  Assessment: F/u with patient and wife. Patient a little worse today.   Discussed plan with wife - wife feels comfortable with plan of coming home with hospice in next day or two. I did offer to her that the is likely eligible to go to hospice  facility given his continued decline, lethargy, and poor PO intake. She expresses understanding but does state she would prefer to have him home.   We reviewed aspiration precautions while balancing his comfort/desires - reviewed ways to minimize aspiration risks and how to proceed with caution. Educational information provided.   Recommendations/Plan:  Home with hospice - likely appropriate for hospice facility but wife prefers to have him home  Diet for comfort, family understands risk of aspiration and want to promote comfort  Goals of Care and Additional Recommendations:  Limitations on Scope of Treatment: Avoid Hospitalization  Code Status:  DNR  Prognosis:   < 6 months - likely much shorter given worsening mental status, poor intake  Discharge Planning:  Home with Hospice  Care plan was discussed with wife  Thank you for allowing the Palliative Medicine Team to assist in the care of this patient.   Total Time 30 minutes Prolonged Time Billed  no      Greater than 50%  of this time was spent counseling and coordinating care related to the above assessment and plan.   Gerlean Ren, DNP, [facility] Palliative Medicine Team Team Phone # [phone]  Pager [phone]

## 2020-02-14 DIAGNOSIS — I4891 Unspecified atrial fibrillation: Secondary | ICD-10-CM | POA: Diagnosis not present

## 2020-02-14 DIAGNOSIS — A419 Sepsis, unspecified organism: Secondary | ICD-10-CM | POA: Diagnosis not present

## 2020-02-14 DIAGNOSIS — K810 Acute cholecystitis: Secondary | ICD-10-CM | POA: Diagnosis not present

## 2020-02-14 LAB — CBC WITH DIFFERENTIAL/PLATELET
Abs Immature Granulocytes: 0.07 10*3/uL (ref 0.00–0.07)
Basophils Absolute: 0 10*3/uL (ref 0.0–0.1)
Basophils Relative: 0 %
Eosinophils Absolute: 0.1 10*3/uL (ref 0.0–0.5)
Eosinophils Relative: 1 %
HCT: 33 % — ABNORMAL LOW (ref 39.0–52.0)
Hemoglobin: 12 g/dL — ABNORMAL LOW (ref 13.0–17.0)
Immature Granulocytes: 1 %
Lymphocytes Relative: 12 %
Lymphs Abs: 0.8 10*3/uL (ref 0.7–4.0)
MCH: 31.2 pg (ref 26.0–34.0)
MCHC: 36.4 g/dL — ABNORMAL HIGH (ref 30.0–36.0)
MCV: 85.7 fL (ref 80.0–100.0)
Monocytes Absolute: 0.3 10*3/uL (ref 0.1–1.0)
Monocytes Relative: 6 %
Neutro Abs: 4.8 10*3/uL (ref 1.7–7.7)
Neutrophils Relative %: 80 %
Platelets: 32 10*3/uL — ABNORMAL LOW (ref 150–400)
RBC: 3.85 MIL/uL — ABNORMAL LOW (ref 4.22–5.81)
RDW: 16 % — ABNORMAL HIGH (ref 11.5–15.5)
WBC: 6 10*3/uL (ref 4.0–10.5)
nRBC: 0 % (ref 0.0–0.2)

## 2020-02-14 LAB — GLUCOSE, CAPILLARY
Glucose-Capillary: 133 mg/dL — ABNORMAL HIGH (ref 70–99)
Glucose-Capillary: 151 mg/dL — ABNORMAL HIGH (ref 70–99)
Glucose-Capillary: 135 mg/dL — ABNORMAL HIGH (ref 70–99)
Glucose-Capillary: 143 mg/dL — ABNORMAL HIGH (ref 70–99)

## 2020-02-14 MED ORDER — ACETAMINOPHEN 325 MG PO TABS: 650.0000 mg | ORAL_TABLET | Freq: Four times a day (QID) | ORAL | Status: AC | PRN

## 2020-02-14 MED ORDER — GUAIFENESIN ER 600 MG PO TB12: 600.0000 mg | ORAL_TABLET | Freq: Two times a day (BID) | ORAL | 2 refills | Status: AC

## 2020-02-14 MED ORDER — MIDODRINE HCL 10 MG PO TABS: 10.0000 mg | ORAL_TABLET | Freq: Three times a day (TID) | ORAL | 0 refills | Status: AC

## 2020-02-14 MED ORDER — MORPHINE SULFATE (CONCENTRATE) 20 MG/ML PO SOLN: 10.0000 mg | ORAL | 0 refills | Status: AC | PRN

## 2020-02-14 MED ORDER — AMIODARONE HCL 200 MG PO TABS: 200.0000 mg | ORAL_TABLET | Freq: Two times a day (BID) | ORAL | 0 refills | Status: AC

## 2020-02-14 MED ORDER — AMIODARONE HCL 200 MG PO TABS
200.0000 mg | ORAL_TABLET | Freq: Two times a day (BID) | ORAL | Status: DC
  Administered 2020-02-14: 200 mg via ORAL
  Filled 2020-02-14: qty 1

## 2020-02-14 MED ORDER — AMIODARONE HCL 200 MG PO TABS: 200.0000 mg | ORAL_TABLET | Freq: Every day | ORAL | 0 refills | Status: AC

## 2020-02-14 NOTE — Progress Notes (Signed)
Discharge instructions placed in chart for [facility]. Medications and wound care reviewed with wife over the phone. All questions answered. IV team to remove PICC. Awaiting transportation home via ambulance. Will continue to monitor.  Hazle Nordmann, RN

## 2020-02-14 NOTE — Discharge Summary (Addendum)
Physician Discharge Summary  Mildred Strosnider MBW:[id number] DOB: 15-Jul-1945 DOA: 01/30/2020  PCP: Sheliah Hatch, MD  Admit date: 01/30/2020 Discharge date: 02/14/2020  Admitted From: Home Disposition:  Home with hospice  Recommendations for Outpatient Follow-up:  1. Follow up with PCP as needed 2. To follow up with hospice services  NCCSR reviewed. Limited quantity of narcotic prescribed for palliative purposes  Discharge Condition:Stable CODE STATUS:DNR, hospice Diet recommendation: comfort   Brief/Interim Summary: 75/M chronically illwith history significant ofOSA; psoriatic arthritis; morbid obesity (BMI 40); HTN; DM; BPH, chronic diastolic CHF (10/2019 echo with preserved EF and grade 2 diastolic dysfunction) presentedwith nausea and anorexia. Pt was found to have gangrenous cholecystitis and underwent lap chole on 12/21. Course was complicated by worsening thrombocytopenia, afib RVR and later acute CVA. Overall condition continued to progressively decline and Palliative Care consulted. Ultimately, decision was made for focus on hospice  Discharge Diagnoses:  Principal Problem:   Sepsis due to undetermined organism [facility]) Active Problems:   OSA on CPAP   Hyperlipidemia   BPH (benign prostatic hyperplasia)   Obesity, Class III, BMI 40-49.9 (morbid obesity) (HCC)   Essential hypertension   Uncontrolled type 2 diabetes mellitus with hyperglycemia, with long-term current use of insulin (HCC)   Perineal abscess   Chronic diastolic congestive heart failure (HCC)   Acute emphysematous cholecystitis   Cerebral embolism with cerebral infarction   Protein-calorie malnutrition, severe   Pressure injury of skin   Sepsis Gangrenous/emphysematous cholecystitis -General surgery had been following -Ongoing lingering symptoms for 6 to 8 months -HIDA scan was found to be abnormal. Now status post laparoscopic cholecystectomy on 02/02/2020 by Dr. Janee Morn.  -Pt completed course of IV abx  per general surgery. Surgery has since signed off -Patient was seen by SLP and cleared for dysphagia diet. Allow po intake as tolerated. Family is aware of risk of aspiration  Recent perirectal abscess  -With perirectal thickening on CT  A. fib with RVR Chronic diastolic CHF Due to persistent A. fib with RVR early morning of 02/06/2020, cardiology was reconsulted.  -Patient was continued on digoxin and Cardizem.  -Rapid response was called on the evening of 02/07/2020 due to low blood pressure and some lethargy.  -Cardiology started him on IV amiodarone and continued him on digoxin and cardizem was stopped.  -HR remains better controlled on amio gtt -Was on eliquis, however, anticoagulation continues to be held given below worsening thrombocytopenia  -Per cardiology, recommendation to transition to PO amiodarone at time of d/c. Cardiology has since signed off  Type 2 diabetes mellitus -Jardiance remains on hold, patient is continued on SSI coverage  Mild dementia/cognitive deficits: Mentation seems stable this AM. Pt is conversant  Abnormal chest x-ray suspecting possible infiltrates: Earlier repeat chest x-ray noted to be unremarkable  Morbid obesity Obstructive sleep apnea -cont supportive care for now  Goals of care DO NOT RESUSCITATE -Long hx of progressive decline with rapid decline post-operatively with afib RVR and new stroke and worsening thrombocytopenia -Palliative Care following. On discussion with family , plan for focus on palliation and discharge to home with hospice. TOC following  Deconditioning:Seen by PT OT earlier with recs for SNF noted. Per above, plan for discharge to home with hospice at time of d/c  Vertigo: Continue meclizine as recommended by PT. His symptoms are likely secondary to BPPV.   Fluid/volume overload: Patient has +2 pitting edema bilateral lower extremity which is likely due to hypoalbuminemia. Cardiology had been following.  Given multiple doses of IV albumin  Possible peripheral neuropathy:Patient is very tender to touch on both lower extremities. Likely undiagnosed peripheral neuropathy in a patient who already has diabetes.  Hypomagnesemia/hypokalemia: -Replaced this visit  Acute CVA confirmed:  -Recent acute neurologic changes noted -MRI brain reviewed, findings worrisome for acute R centrum semiovale CVA -Neurology consulted and was following, since signed off -Discussed with Cardiology and Neurology. Given worsening thrombocytopenia, plan to hold anticoagulation  Thrombocytopenia -Uncertain etiology, noted to have gradually trended down since initial visit while pt was on eliquis -Suspect may be related to presenting sepsis -Earlier discussed with Neurology and Cardiology. Given thrombocytopenia, anticoagulation remains on hold. Family aware of increased risk for new or worsened stroke without anticoagulation   Discharge Instructions  Discharge Instructions    Ambulatory referral to Neurology   Complete by: As directed    Follow up with Dr. Delice Lesch in 4 weeks. Pt is Dr. Amparo Bristol pt in the past. Thanks.     Allergies as of 02/14/2020      Reactions   Penicillins Hives   Patient reports full body hives that required medical treatment when he was in his 76s or 76s. He tolerated cephalosporins.    Lyrica [pregabalin] Swelling   Levofloxacin Swelling      Medication List    STOP taking these medications   allopurinol 300 MG tablet Commonly known as: ZYLOPRIM   atorvastatin 40 MG tablet Commonly known as: LIPITOR   B-12 2000 MCG Tabs   bisoprolol 5 MG tablet Commonly known as: ZEBETA   cetirizine 10 MG tablet Commonly known as: ZYRTEC   donepezil 10 MG tablet Commonly known as: ARICEPT   folic acid 1 MG tablet Commonly known as: FOLVITE   furosemide 20 MG tablet Commonly known as: LASIX   Jardiance 10 MG Tabs tablet Generic drug: empagliflozin   meloxicam 15 MG  tablet Commonly known as: MOBIC   omeprazole 40 MG capsule Commonly known as: PRILOSEC   pioglitazone 30 MG tablet Commonly known as: ACTOS   potassium chloride 10 MEQ CR capsule Commonly known as: MICRO-K   psyllium 95 % Pack Commonly known as: HYDROCIL/METAMUCIL   Vitamin D 50 MCG (2000 UT) tablet     TAKE these medications   acetaminophen 325 MG tablet Commonly known as: TYLENOL Take 2 tablets (650 mg total) by mouth every 6 (six) hours as needed for mild pain (or Fever >/= 101).   amiodarone 200 MG tablet Commonly known as: Pacerone Take 1 tablet (200 mg total) by mouth 2 (two) times daily for 14 days.   amiodarone 200 MG tablet Commonly known as: Pacerone Take 1 tablet (200 mg total) by mouth daily. Start taking on: February 29, 2020   fluocinonide cream 0.05 % Commonly known as: LIDEX Apply 1 application topically 2 (two) times daily. To arms for psoriasis   fluticasone 50 MCG/ACT nasal spray Commonly known as: FLONASE Place 2 sprays into both nostrils daily.   guaiFENesin 600 MG 12 hr tablet Commonly known as: Mucinex Take 1 tablet (600 mg total) by mouth 2 (two) times daily.   midodrine 10 MG tablet Commonly known as: PROAMATINE Place 1 tablet (10 mg total) into feeding tube 3 (three) times daily with meals.   morphine 20 MG/ML concentrated solution Commonly known as: ROXANOL Take 0.5 mLs (10 mg total) by mouth every 4 (four) hours as needed for severe pain or shortness of breath.   nystatin ointment Commonly known as: MYCOSTATIN Apply 1 application topically daily. Apply to groin and perineal area  ProAir RespiClick [id number] (90 Base) MCG/ACT Aepb Generic drug: Albuterol Sulfate Inhale 2 puffs into the lungs every 6 (six) hours as needed (for wheezing or shortness of breath).   promethazine 25 MG tablet Commonly known as: PHENERGAN Take 1 tablet (25 mg total) by mouth every 8 (eight) hours as needed for nausea or vomiting. Alternate w/ Ondansetron  (Zofran)   tamsulosin 0.4 MG Caps capsule Commonly known as: FLOMAX Take 0.4 mg by mouth daily.   tiZANidine 4 MG tablet Commonly known as: Zanaflex Take 0.5-1 tablets (2-4 mg total) by mouth every 8 (eight) hours as needed for muscle spasms. What changed:   how much to take  when to take this  additional instructions       Follow-up Information    [facility], [facility]. Go on 02/23/2020.   Specialty: General Surgery Why: 9:45 AM. Please arrive 30 min prior to appointment time. Bring photo ID and insurance information.  Contact information: [place] [address] [phone]        Cameron Sprang, MD. Schedule an appointment as soon as possible for a visit in 4 week(s).   Specialty: Neurology Contact information: [place] [address] [phone]        [facility] Follow up.   Why: Home Hospice referral made  Contact information: [place] [address] [phone]              Allergies  Allergen Reactions  . Penicillins Hives    Patient reports full body hives that required medical treatment when he was in his 64s or 74s. He tolerated cephalosporins.   [name] [Pregabalin] Swelling  . Levofloxacin Swelling    Consultations:  General Surgery  Cardiology  Palliative Care  Procedures/Studies: CT ABDOMEN PELVIS WO CONTRAST  Result Date: 01/30/2020 CLINICAL DATA:  75 year old with lack of appetite.  Abdominal pain. EXAM: CT ABDOMEN AND PELVIS WITHOUT CONTRAST TECHNIQUE: Multidetector CT imaging of the abdomen and pelvis was performed following the standard protocol without IV contrast. COMPARISON:  11/22/2019 FINDINGS: Lower chest: Dependent densities at the lung bases. No significant pleural effusions. Small amount of pericardial fluid along the anterior aspect of the heart. Hepatobiliary: Evidence for gallstones with an air-fluid level in the gallbladder. No  significant gallbladder distension. There may be minimal pericholecystic stranding. Normal appearance of the liver. No significant biliary dilatation. Pancreas: Unremarkable. No pancreatic ductal dilatation or surrounding inflammatory changes. Spleen: Normal in size without focal abnormality. Adrenals/Urinary Tract: Normal appearance of the adrenal glands. Bilateral renal cysts. Large cyst in the right kidney upper pole that measures up to 7.5 cm. Negative for hydronephrosis. Urinary bladder is markedly distended. No evidence for urinary stones. Stomach/Bowel: Diffuse wall thickening involving the rectum. There is perirectal edema and a small amount of presacral fluid or edema. Normal appearance of the stomach. No evidence for bowel obstruction. Questionable wall thickening involving the right colon on sequence 3 image 42. Vascular/Lymphatic: Atherosclerotic calcifications involving the abdominal aorta without aneurysm. No lymph node enlargement in the abdomen or pelvis. Reproductive: Prostate is unremarkable. Other: Fat containing inguinal hernias, left side greater than right. Perirectal stranding with some presacral edema. No significant ascites. Negative for free intraperitoneal air. Musculoskeletal: No acute bone abnormality. IMPRESSION: 1. Cholelithiasis with new air-fluid level in the gallbladder. Evidence for mild pericholecystic stranding. 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 Recommend surgical consultation. 2. Diffuse wall thickening involving the  rectum with perirectal edema. Findings are suggestive for inflammation and proctitis. Underlying neoplastic process cannot be excluded and recommend GI consultation. Questionable wall thickening involving the right colon. 3. Distention of the urinary bladder without hydronephrosis. 4. Bilateral renal cysts. These results were called by telephone at the time of interpretation on 01/30/2020 at 2:25 pm to provider [facility] , who verbally acknowledged these results. Electronically Signed   By: Markus Daft M.D.   On: 01/30/2020 14:30   CT ANGIO HEAD W OR WO CONTRAST  Result Date: 02/10/2020 CLINICAL DATA:  Left-sided weakness with dizziness and diplopia EXAM: CT ANGIOGRAPHY HEAD AND NECK TECHNIQUE: Multidetector CT imaging of the head and neck was performed using the standard protocol during bolus administration of intravenous contrast. Multiplanar CT image reconstructions and MIPs were obtained to evaluate the vascular anatomy. Carotid stenosis measurements (when applicable) are obtained utilizing NASCET criteria, using the distal internal carotid diameter as the denominator. CONTRAST:  21mL OMNIPAQUE IOHEXOL 350 MG/ML SOLN COMPARISON:  None. FINDINGS: CT HEAD FINDINGS Brain: There is no mass, hemorrhage or extra-axial collection. The size and configuration of the ventricles and extra-axial CSF spaces are normal. There is hypoattenuation of the periventricular white matter, most commonly indicating chronic ischemic microangiopathy. Skull: The visualized skull base, calvarium and extracranial soft tissues are normal. Sinuses/Orbits: No fluid levels or advanced mucosal thickening of the visualized paranasal sinuses. No mastoid or middle ear effusion. The orbits are normal. CTA NECK FINDINGS SKELETON: There is no bony spinal canal stenosis. No lytic or blastic lesion. OTHER NECK: Normal pharynx, larynx and major salivary glands. No cervical lymphadenopathy. Unremarkable thyroid gland. UPPER CHEST: No pneumothorax or pleural effusion. No nodules or masses. AORTIC ARCH: There is no calcific atherosclerosis of the aortic arch. There is no aneurysm, dissection or hemodynamically significant stenosis of the visualized portion of the aorta. Conventional 3 vessel aortic branching pattern. The visualized proximal subclavian arteries are widely patent. RIGHT CAROTID SYSTEM: Normal without aneurysm, dissection or stenosis. LEFT CAROTID  SYSTEM: Normal without aneurysm, dissection or stenosis. VERTEBRAL ARTERIES: Left dominant configuration. Both origins are clearly patent. There is no dissection, occlusion or flow-limiting stenosis to the skull base (V1-V3 segments). CTA HEAD FINDINGS POSTERIOR CIRCULATION: --Vertebral arteries: Normal V4 segments. --Inferior cerebellar arteries: Normal. --Basilar artery: Normal. --Superior cerebellar arteries: Normal. --Posterior cerebral arteries (PCA): Normal. ANTERIOR CIRCULATION: --Intracranial internal carotid arteries: Normal. --Anterior cerebral arteries (ACA): Normal. Both A1 segments are present. Patent anterior communicating artery (a-comm). --Middle cerebral arteries (MCA): Normal. VENOUS SINUSES: As permitted by contrast timing, patent. ANATOMIC VARIANTS: None Review of the MIP images confirms the above findings. IMPRESSION: 1. No emergent large vessel occlusion or hemodynamically significant stenosis of the head or neck. 2. Chronic ischemic microangiopathy. Electronically Signed   By: Ulyses Jarred M.D.   On: 02/10/2020 01:30   CT HEAD WO CONTRAST  Result Date: 02/12/2020 CLINICAL DATA:  Stroke follow-up. EXAM: CT HEAD WITHOUT CONTRAST TECHNIQUE: Contiguous axial images were obtained from the base of the skull through the vertex without intravenous contrast. COMPARISON:  CT head 02/10/2020.  MRI head 02/09/2020 FINDINGS: Brain: Tiny acute infarct in the right corona radiata seen on diffusion-weighted imaging not identified by CT. Generalized moderate atrophy. Mild to moderate white matter changes consistent with chronic ischemia. No acute hemorrhage or mass. Vascular: Negative for hyperdense vessel. Atherosclerotic calcification right middle cerebral artery. Atherosclerotic mild calcification in the distal vertebral arteries. Skull: Negative Sinuses/Orbits: Paranasal sinuses clear.  Negative orbit. Other: None IMPRESSION: Atrophy and chronic white matter  ischemia. No acute abnormality by CT and  no acute hemorrhage. Electronically Signed   By: Franchot Gallo M.D.   On: 02/12/2020 17:38   CT ANGIO NECK W OR WO CONTRAST  Result Date: 02/10/2020 CLINICAL DATA:  Left-sided weakness with dizziness and diplopia EXAM: CT ANGIOGRAPHY HEAD AND NECK TECHNIQUE: Multidetector CT imaging of the head and neck was performed using the standard protocol during bolus administration of intravenous contrast. Multiplanar CT image reconstructions and MIPs were obtained to evaluate the vascular anatomy. Carotid stenosis measurements (when applicable) are obtained utilizing NASCET criteria, using the distal internal carotid diameter as the denominator. CONTRAST:  62mL OMNIPAQUE IOHEXOL 350 MG/ML SOLN COMPARISON:  None. FINDINGS: CT HEAD FINDINGS Brain: There is no mass, hemorrhage or extra-axial collection. The size and configuration of the ventricles and extra-axial CSF spaces are normal. There is hypoattenuation of the periventricular white matter, most commonly indicating chronic ischemic microangiopathy. Skull: The visualized skull base, calvarium and extracranial soft tissues are normal. Sinuses/Orbits: No fluid levels or advanced mucosal thickening of the visualized paranasal sinuses. No mastoid or middle ear effusion. The orbits are normal. CTA NECK FINDINGS SKELETON: There is no bony spinal canal stenosis. No lytic or blastic lesion. OTHER NECK: Normal pharynx, larynx and major salivary glands. No cervical lymphadenopathy. Unremarkable thyroid gland. UPPER CHEST: No pneumothorax or pleural effusion. No nodules or masses. AORTIC ARCH: There is no calcific atherosclerosis of the aortic arch. There is no aneurysm, dissection or hemodynamically significant stenosis of the visualized portion of the aorta. Conventional 3 vessel aortic branching pattern. The visualized proximal subclavian arteries are widely patent. RIGHT CAROTID SYSTEM: Normal without aneurysm, dissection or stenosis. LEFT CAROTID SYSTEM: Normal without  aneurysm, dissection or stenosis. VERTEBRAL ARTERIES: Left dominant configuration. Both origins are clearly patent. There is no dissection, occlusion or flow-limiting stenosis to the skull base (V1-V3 segments). CTA HEAD FINDINGS POSTERIOR CIRCULATION: --Vertebral arteries: Normal V4 segments. --Inferior cerebellar arteries: Normal. --Basilar artery: Normal. --Superior cerebellar arteries: Normal. --Posterior cerebral arteries (PCA): Normal. ANTERIOR CIRCULATION: --Intracranial internal carotid arteries: Normal. --Anterior cerebral arteries (ACA): Normal. Both A1 segments are present. Patent anterior communicating artery (a-comm). --Middle cerebral arteries (MCA): Normal. VENOUS SINUSES: As permitted by contrast timing, patent. ANATOMIC VARIANTS: None Review of the MIP images confirms the above findings. IMPRESSION: 1. No emergent large vessel occlusion or hemodynamically significant stenosis of the head or neck. 2. Chronic ischemic microangiopathy. Electronically Signed   By: Ulyses Jarred M.D.   On: 02/10/2020 01:30   MR BRAIN WO CONTRAST  Result Date: 02/09/2020 CLINICAL DATA:  Neuro deficit, acute, stroke suspected EXAM: MRI HEAD WITHOUT CONTRAST TECHNIQUE: Multiplanar, multiecho pulse sequences of the brain and surrounding structures were obtained without intravenous contrast. COMPARISON:  10/29/2019 and prior. FINDINGS: Brain: 2-3 mm right centrum semiovale restricted diffusion. No intracranial hemorrhage. No midline shift, ventriculomegaly or extra-axial fluid collection. No mass lesion. Diffuse parenchymal volume loss with ex vacuo dilatation. Chronic right corona radiata and left cerebellar lacunar insults. Moderate chronic microvascular ischemic changes. Vascular: Normal flow voids. Skull and upper cervical spine: Normal marrow signal. Sinuses/Orbits: Normal orbits. Mild ethmoid sinus mucosal thickening. Minimal bilateral mastoid free fluid. Other: None. IMPRESSION: 2-3 mm right centrum semiovale  acute/subacute infarct. Chronic right corona radiata and left cerebellar lacunar insults. Age-related cerebral atrophy and moderate chronic microvascular ischemic changes. These results will be called to the ordering clinician or representative by the Radiologist Assistant, and communication documented in the PACS or [employer]. Electronically Signed   By: Primitivo Gauze  M.D.   On: 02/09/2020 14:52   NM Hepatobiliary Liver Func  Result Date: 02/01/2020 CLINICAL DATA:  Upper abdominal pain, nausea and vomiting EXAM: NUCLEAR MEDICINE HEPATOBILIARY IMAGING TECHNIQUE: Sequential images of the abdomen were obtained out to 60 minutes following intravenous administration of radiopharmaceutical. RADIOPHARMACEUTICALS:  4.91 mCi Tc-4222m Choletec IV, 2 mg morphine given intravenously COMPARISON:  01/30/2020 FINDINGS: There is prompt uptake of radiotracer by the hepatic parenchyma. Activity is seen within the common bile duct by 25 minutes. Activity is seen within the small bowel by 30 minutes. At 90 minutes, the gallbladder is still not visualized. 2 mg of morphine was given and subsequent imaging was performed for 30 minutes. The gallbladder is not visualized by the conclusion of the exam. IMPRESSION: 1. Nonvisualization of the gallbladder despite morphine administration, consistent with acute cholecystitis and cystic duct obstruction. Electronically Signed   By: Sharlet SalinaMichael  Brown M.D.   On: 02/01/2020 21:38   DG Chest Port 1 View  Result Date: 02/10/2020 CLINICAL DATA:  Chest pain EXAM: PORTABLE CHEST 1 VIEW COMPARISON:  None. FINDINGS: The heart size and mediastinal contours are within normal limits. Tip of the OG tube is seen off the distal esophagus. A right-sided PICC seen with the tip in the superior cavoatrial junction. Small bilateral pleural effusions are seen. The visualized skeletal structures are unremarkable. IMPRESSION: Tip the NG tube seen within the distal esophagus. Small bilateral pleural  effusions. Electronically Signed   By: Jonna ClarkBindu  Avutu M.D.   On: 02/10/2020 19:16   DG CHEST PORT 1 VIEW  Result Date: 02/08/2020 CLINICAL DATA:  Hypoxia EXAM: PORTABLE CHEST 1 VIEW COMPARISON:  02/06/2020 FINDINGS: Heart is normal size. No confluent airspace opacity or effusion. No acute bony abnormality. IMPRESSION: No active disease. Electronically Signed   By: Charlett NoseKevin  Dover M.D.   On: 02/08/2020 09:50   DG CHEST PORT 1 VIEW  Result Date: 02/06/2020 CLINICAL DATA:  Shortness of breath EXAM: PORTABLE CHEST 1 VIEW COMPARISON:  Portable exam 1356 hours compared to 01/30/2020 FINDINGS: Normal heart size, mediastinal contours, and pulmonary vascularity. Opacity in medial LEFT lower lobe question atelectasis or infiltrate. Remaining lungs grossly clear. No pleural effusion or pneumothorax. Osseous structures unremarkable. IMPRESSION: Atelectasis versus infiltrate at medial LEFT lower lobe. Electronically Signed   By: Ulyses SouthwardMark  Boles M.D.   On: 02/06/2020 14:04   DG Chest Port 1 View  Result Date: 01/30/2020 CLINICAL DATA:  Dyspnea EXAM: PORTABLE CHEST 1 VIEW COMPARISON:  10/01/2018 chest radiograph. FINDINGS: Stable cardiomediastinal silhouette with mild cardiomegaly. No pneumothorax. Chronic mild bilateral pleural thickening without pleural effusions. No pulmonary edema. No acute consolidative airspace disease. Low lung volumes. IMPRESSION: 1. Stable mild cardiomegaly without pulmonary edema. 2. Low lung volumes with no active pulmonary disease. Electronically Signed   By: Delbert PhenixJason A Poff M.D.   On: 01/30/2020 13:37   DG Abd Portable 1V  Result Date: 02/10/2020 CLINICAL DATA:  Feeding tube placement. EXAM: PORTABLE ABDOMEN - 1 VIEW COMPARISON:  None. FINDINGS: The bowel gas pattern is normal. Distal tip of feeding tube is seen in expected position of distal stomach. No radio-opaque calculi or other significant radiographic abnormality are seen. IMPRESSION: Distal tip of feeding tube seen in expected position  of distal stomach. Electronically Signed   By: Lupita RaiderJames  Green Jr M.D.   On: 02/10/2020 14:26   ECHOCARDIOGRAM COMPLETE  Result Date: 02/10/2020    ECHOCARDIOGRAM REPORT   Patient Name:   Fanny SkatesDALE Heitz  Date of Exam: 02/10/2020 Medical Rec #:  [id number]  Height:  71.0 in Accession #:    [id number] Weight:       275.6 lb Date of Birth:  09/30/1945  BSA:          2.416 m Patient Age:    8 years   BP:           108/69 mmHg Patient Gender: M          HR:           115 bpm. Exam Location:  Inpatient Procedure: 2D Echo Indications:    Stroke 434.91 / I163.9  History:        Patient has prior history of Echocardiogram examinations, most                 recent 02/07/2020. CHF, Arrythmias:Atrial Fibrillation; Risk                 Factors:Diabetes, Hypertension and Sleep Apnea.  Sonographer:    Darlina [place] RDCS Referring Phys: [id number] ASHISH ARORA  Sonographer Comments: Technically challenging study due to limited acoustic windows, suboptimal parasternal window, suboptimal apical window and suboptimal subcostal window. Patient declined the use of definity. IMPRESSIONS Extremely limited study, poor visualization of LV/RV and patient declined contrast. If concern for embolic source of CVA, recommend TEE for further evaluation FINDINGS  Left Ventricle: Left ventricular ejection fraction is not well visualized. The left ventricle has not well visualized function. Left ventricular endocardial border not optimally defined to evaluate regional wall motion. The left ventricular internal cavity size was not well visualized. There is not well visualized left ventricular hypertrophy. Left ventricular diastolic parameters are indeterminate. Right Ventricle: The right ventricular size is not well visualized. Right vetricular wall thickness was not well visualized. Right ventricular systolic function was not well visualized. Left Atrium: Left atrial size was not well visualized. Right Atrium: Right atrial size was not well  visualized. Pericardium: The pericardium was not well visualized. Mitral Valve: The mitral valve was not well visualized. Not well visualized mitral valve regurgitation. Tricuspid Valve: The tricuspid valve is not well visualized. Tricuspid valve regurgitation not well visualized. Aortic Valve: The aortic valve was not well visualized. Aortic valve regurgitation not well visualized. Pulmonic Valve: The pulmonic valve was not well visualized. Pulmonic valve regurgitation not well visualized. Aorta: The aortic root was not well visualized. IAS/Shunts: The interatrial septum was not well visualized. Oswaldo Milian MD Electronically signed by Oswaldo Milian MD Signature Date/Time: 02/10/2020/7:21:33 PM    Final    VAS [place] UPPER EXTREMITY VENOUS DUPLEX  Result Date: 02/04/2020 UPPER VENOUS STUDY  Indications: Swelling and pain Other Indications: Previous IV site in area of AC fossa. Limitations: IJV difficult to assess due to beard. Performing Technologist: Rogelia Rohrer  Examination Guidelines: A complete evaluation includes B-mode imaging, spectral Doppler, color Doppler, and power Doppler as needed of all accessible portions of each vessel. Bilateral testing is considered an integral part of a complete examination. Limited examinations for reoccurring indications may be performed as noted.  Right Findings: +----------+------------+---------+-----------+----------+-------+ RIGHT     CompressiblePhasicitySpontaneousPropertiesSummary +----------+------------+---------+-----------+----------+-------+ Subclavian    Full       Yes       Yes                      +----------+------------+---------+-----------+----------+-------+  Left Findings: +----------+------------+---------+-----------+----------+---------------------+ LEFT      CompressiblePhasicitySpontaneousProperties       Summary        +----------+------------+---------+-----------+----------+---------------------+ IJV  Yes       Yes                 Not adequately                                                            assessed due to                                                               beard.         +----------+------------+---------+-----------+----------+---------------------+ Subclavian    Full       Yes       Yes                                    +----------+------------+---------+-----------+----------+---------------------+ Axillary      Full       Yes       Yes                                    +----------+------------+---------+-----------+----------+---------------------+ Brachial      Full       Yes       Yes                                    +----------+------------+---------+-----------+----------+---------------------+ Radial        Full                                                        +----------+------------+---------+-----------+----------+---------------------+ Ulnar         Full                                                        +----------+------------+---------+-----------+----------+---------------------+ Cephalic      Full       Yes       Yes                                    +----------+------------+---------+-----------+----------+---------------------+ Basilic       Full       Yes       Yes                                    +----------+------------+---------+-----------+----------+---------------------+  *See table(s) above for measurements and observations.    Preliminary    ECHOCARDIOGRAM LIMITED  Result Date: 02/07/2020    ECHOCARDIOGRAM  LIMITED REPORT   Patient Name:   KHAREE LESESNE  Date of Exam: 02/07/2020 Medical Rec #:  [id number]  Height:       71.0 in Accession #:    [id number] Weight:       283.3 lb Date of Birth:  [birth year]  BSA:          2.445 m Patient Age:    74 years   BP:           92/66 mmHg Patient Gender: M          HR:           98 bpm. Exam Location:  Inpatient Procedure: Color  Doppler, Limited Echo and Intracardiac Opacification Agent Indications:    CHF-Acute Diastolic  History:        Patient has prior history of Echocardiogram examinations, most                 recent 10/22/2019. CHF, Arrythmias:Atrial Fibrillation; Risk                 Factors:Sleep Apnea, Diabetes and Hypertension.  Sonographer:    Ross Ludwig RDCS (AE) Referring Phys: [id number] [facility] O'NEAL  Sonographer Comments: Technically challenging study due to limited acoustic windows, Technically difficult study due to poor echo windows, no parasternal window, suboptimal apical window, no subcostal window and patient is morbidly obese. Image acquisition challenging due to patient body habitus and limited patient mobility. Extremely poor windows limit ability to obtain diagnostic images. IMPRESSIONS  1. Very limited study quality, LVEF determined by Definity echocontrast and appears normal 55-60%, right ventricle is not visualized. FINDINGS  Left Ventricle: Definity contrast agent was given IV to delineate the left ventricular endocardial borders. Tobias Alexander MD Electronically signed by Tobias Alexander MD Signature Date/Time: 02/07/2020/3:41:27 PM    Final    [place] EKG SITE RITE  Result Date: 02/10/2020 If Site Rite image not attached, placement could not be confirmed due to current cardiac rhythm.    Subjective: Eager to go home  Discharge Exam: Vitals:   02/14/20 0826 02/14/20 1115  BP: 97/70 96/60  Pulse: (!) 120 (!) 110  Resp: 19 20  Temp: 97.7 F (36.5 C) 98.2 F (36.8 C)  SpO2: 92% 97%   Vitals:   02/13/20 2344 02/14/20 0346 02/14/20 0826 02/14/20 1115  BP: 104/66 104/64 97/70 96/60   Pulse: (!) 119 (!) 108 (!) 120 (!) 110  Resp: (!) 24 (!) 24 19 20   Temp: 97.8 F (36.6 C) 97.7 F (36.5 C) 97.7 F (36.5 C) 98.2 F (36.8 C)  TempSrc: Oral Oral Oral Axillary  SpO2: (!) 80% 100% 92% 97%  Weight:      Height:        General: Pt is alert, awake, not in acute distress Cardiovascular:  RRR, S1/S2 +, no rubs, no gallops Respiratory: CTA bilaterally, no wheezing, no rhonchi Abdominal: Soft, NT, ND, bowel sounds + Extremities: LE edema, no cyanosis   The results of significant diagnostics from this hospitalization (including imaging, microbiology, ancillary and laboratory) are listed below for reference.     Microbiology: Recent Results (from the past 240 hour(s))  Culture, blood (Routine X 2) w Reflex to ID Panel     Status: None   Collection Time: 02/06/20  2:07 PM   Specimen: BLOOD  Result Value Ref Range Status   Specimen Description BLOOD RIGHT ANTECUBITAL  Final   Special Requests AEROBIC BOTTLE ONLY Blood Culture adequate volume  Final   Culture  Final    NO GROWTH 5 DAYS Performed at [facility], [place] [address] [place], [place] [address]    Report Status 02/11/2020 FINAL  Final  Culture, blood (Routine X 2) w Reflex to ID Panel     Status: None   Collection Time: 02/06/20  2:22 PM   Specimen: BLOOD  Result Value Ref Range Status   Specimen Description BLOOD BLOOD RIGHT HAND  Final   Special Requests   Final    BOTTLES DRAWN AEROBIC AND ANAEROBIC Blood Culture adequate volume   Culture   Final    NO GROWTH 5 DAYS Performed at [facility], [place] [address] [place], [place] [address]    Report Status 02/11/2020 FINAL  Final     Labs: BNP (last 3 results) Recent Labs    02/06/20 1407  BNP [id number]*   Basic Metabolic Panel: Recent Labs  Lab 02/09/20 0144 02/10/20 0230 02/10/20 2200 02/11/20 0555 02/11/20 1511 02/12/20 0612 02/13/20 0530  NA 133* 134*  --  134*  --  135 137  K 3.8 3.2*  --  3.3*  --  3.5 3.8  CL 101 101  --  102  --  103 105  CO2 18* 21*  --  23  --  24 24  GLUCOSE 162* 152*  --  171*  --  189* 141*  BUN 14 11  --  12  --  12 8  CREATININE 0.89 0.75  --  0.62  --  0.63 0.56*  CALCIUM 8.3* 8.1*  --  8.4*  --  8.7* 8.9  MG 1.7 1.8  --  1.8  --  1.7 1.6*  PHOS  --   --  2.0* 2.1* 2.0*  --   --    Liver  Function Tests: Recent Labs  Lab 02/08/20 0043 02/11/20 0555 02/13/20 0530  AST 10* 9* 11*  ALT 11 11 8   ALKPHOS 55 62 54  BILITOT 1.4* 0.7 0.9  PROT 3.6* 3.8* 3.6*  ALBUMIN 1.8* 1.8* 2.3*   No results for input(s): LIPASE, AMYLASE in the last 168 hours. No results for input(s): AMMONIA in the last 168 hours. CBC: Recent Labs  Lab 02/10/20 0230 02/11/20 0834 02/11/20 1525 02/12/20 0612 02/13/20 0530 02/14/20 0747  WBC 3.4* 3.6*  --  2.8* 3.1* 6.0  NEUTROABS 2.4 2.5  --  1.9 1.9 4.8  HGB 13.2 12.7*  --  10.8* 10.3* 12.0*  HCT 36.7* 34.9*  --  31.2* 29.7* 33.0*  MCV 85.9 85.7  --  87.2 87.1 85.7  PLT 31* 29* 21* 20* 19* 32*   Cardiac Enzymes: No results for input(s): CKTOTAL, CKMB, CKMBINDEX, TROPONINI in the last 168 hours. BNP: Invalid input(s): POCBNP CBG: Recent Labs  Lab 02/13/20 2012 02/13/20 2343 02/14/20 0349 02/14/20 0804 02/14/20 1125  GLUCAP 140* 169* 133* 151* 135*   D-Dimer Recent Labs    02/11/20 1525  DDIMER 1.37*   Hgb A1c No results for input(s): HGBA1C in the last 72 hours. Lipid Profile No results for input(s): CHOL, HDL, LDLCALC, TRIG, CHOLHDL, LDLDIRECT in the last 72 hours. Thyroid function studies No results for input(s): TSH, T4TOTAL, T3FREE, THYROIDAB in the last 72 hours.  Invalid input(s): FREET3 Anemia work up No results for input(s): VITAMINB12, FOLATE, FERRITIN, TIBC, IRON, RETICCTPCT in the last 72 hours. Urinalysis    Component Value Date/Time   COLORURINE YELLOW 02/06/2020 1309   APPEARANCEUR CLEAR 02/06/2020 1309   LABSPEC 1.010 02/06/2020 1309  PHURINE 5.0 02/06/2020 1309   GLUCOSEU 50 (A) 02/06/2020 1309   HGBUR SMALL (A) 02/06/2020 1309   BILIRUBINUR NEGATIVE 02/06/2020 1309   KETONESUR 5 (A) 02/06/2020 1309   PROTEINUR NEGATIVE 02/06/2020 1309   UROBILINOGEN 1.0 06/10/2013 1000   NITRITE NEGATIVE 02/06/2020 1309   LEUKOCYTESUR NEGATIVE 02/06/2020 1309   Sepsis Labs Invalid input(s): PROCALCITONIN,  WBC,   LACTICIDVEN Microbiology Recent Results (from the past 240 hour(s))  Culture, blood (Routine X 2) w Reflex to ID Panel     Status: None   Collection Time: 02/06/20  2:07 PM   Specimen: BLOOD  Result Value Ref Range Status   Specimen Description BLOOD RIGHT ANTECUBITAL  Final   Special Requests AEROBIC BOTTLE ONLY Blood Culture adequate volume  Final   Culture   Final    NO GROWTH 5 DAYS Performed at [facility], [place] [address], [place], [place] [address]    Report Status 02/11/2020 FINAL  Final  Culture, blood (Routine X 2) w Reflex to ID Panel     Status: None   Collection Time: 02/06/20  2:22 PM   Specimen: BLOOD  Result Value Ref Range Status   Specimen Description BLOOD BLOOD RIGHT HAND  Final   Special Requests   Final    BOTTLES DRAWN AEROBIC AND ANAEROBIC Blood Culture adequate volume   Culture   Final    NO GROWTH 5 DAYS Performed at [facility], [place] [address], [place], [place] [address]    Report Status 02/11/2020 FINAL  Final   Time spent: 30 min  SIGNED:   Marylu Lund, MD  [facility] 02/14/2020, 2:39 PM  If 7PM-7AM, please contact night-coverage

## 2020-02-14 NOTE — Progress Notes (Signed)
Mobility Specialist: Progress Note   02/14/20 1438  Mobility  Activity  (Cancel)   After consulting with RN, pt is comfort care.   [facility] Agustina Witzke Mobility Specialist

## 2020-02-14 NOTE — TOC Transition Note (Addendum)
Transition of Care [facility]) - CM/SW Discharge Note   Patient Details  Name: Gary Butler MRN: [id number] Date of Birth: 1945/03/11  Transition of Care [facility]) CM/SW Contact:  Gala Lewandowsky, RN Phone Number: 02/14/2020, 1:52 PM   Clinical Narrative: Plan will be for transition home today via [facility]. Case Manager reached out to [facility] -awaiting call back from agency. Wife states they will not need any durable medical equipment at this time. Pt will need a Gold DNR and [facility] transportation home. Case Manager received call back from [facility] and the patient has been accepted for services. Case Manager will call [facility] and get ETA- will make wife and staff RN aware of time. No further needs from Case Manager at this time.   02-14-20 1453 Case Manager called [facility] and ETA is 8:00 pm. [facility] has once unit working and they cannot give an exact time of arrival. Case Manager made MD and Staff RN aware. Staff RN to make the wife aware of ETA. Case Manager reached out to [facility] and the on call RN is asking if the patient can stay overnight and do intake in the am. Case Manager discussed with wife and she wants the patient to be transitioned home today. Plan will continue as is and [facility] will contact wife. No further needs from Case Manager     Final next level of care: Home w Hospice Care Barriers to Discharge: Continued Medical Work up   Patient Goals and CMS Choice Patient states their goals for this hospitalization and ongoing recovery are:: to return home with hospice CMS Medicare.gov Compare Post Acute Care list provided to:: Patient Choice offered to / list presented to : Patient,Spouse,Adult Children   Discharge Plan and Services In-house Referral: Clinical Social Work Discharge Planning Services: CM Consult Post Acute Care Choice: Hospice             [facility] Agency: [facility] Date [facility] Agency Contacted: 02/12/20 Time HH  Agency Contacted: 1301 Representative spoke with at [facility] Agency: [place]  Readmission Risk Interventions No flowsheet data found.

## 2020-02-17 ENCOUNTER — Ambulatory Visit: Payer: Medicare PPO | Admitting: Endocrinology

## 2020-02-18 ENCOUNTER — Telehealth: Payer: Self-pay

## 2020-02-18 NOTE — Telephone Encounter (Signed)
Gary Butler with [facility] was calling to inform you that the patient passed on 18-Feb-2020 at 7:11pm.

## 2020-03-02 ENCOUNTER — Ambulatory Visit: Payer: Medicare PPO | Admitting: Podiatry

## 2020-03-15 DEATH — deceased

## 2022-03-13 IMAGING — MR MR HEAD W/O CM
10 series · 48 of 48 positions shown · non-contrast
Comparison: 02/28/2006

CLINICAL DATA: Memory loss.  Dementia.

EXAM:
MRI HEAD WITHOUT CONTRAST
TECHNIQUE: Multiplanar, multiecho pulse sequences of the brain and surrounding
structures were obtained without intravenous contrast.

[Series 5: T1 · sagittal · 4.0mm · 0.75mm/px · 2 of 31 slices shown (1 of 2)]
[im 1/31]
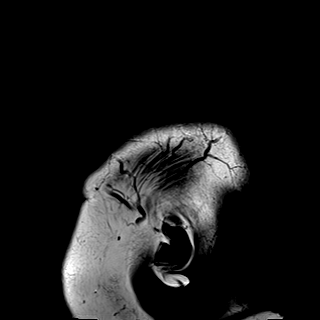
[im 31/31]
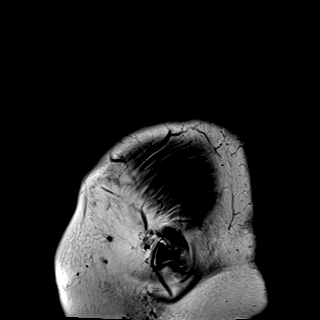

[Series 6: DWI · axial · 3.0mm · 1.50mm/px · z∈[-56,+86]mm · 7 of 90 slices shown (1 of 4)]
[im 1/90]
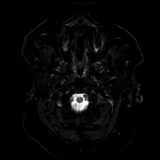
[im 15/90]
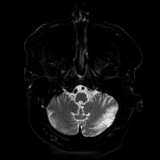
[im 30/90]
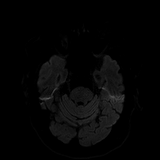
[im 45/90]
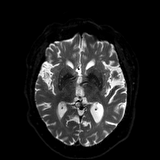
[im 60/90]
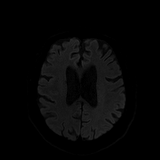
[im 75/90]
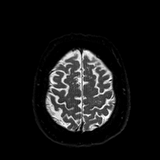
[im 90/90]
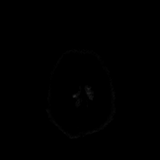

[Series 7: DWI · axial · 3.0mm · 1.50mm/px · z∈[-56,+86]mm · 4 of 45 slices shown (2 of 4)]
[im 1/45]
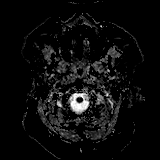
[im 15/45]
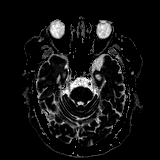
[im 30/45]
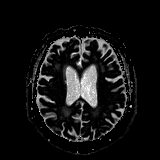
[im 45/45]
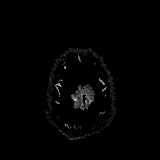

[Series 8: DWI · coronal · 5.0mm · 1.44mm/px · 5 of 63 slices shown (3 of 4)]
[im 1/63]
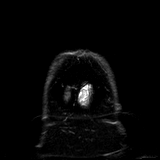
[im 16/63]
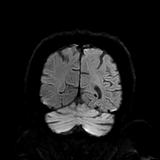
[im 32/63]
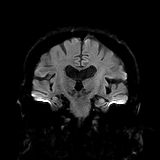
[im 47/63]
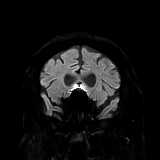
[im 63/63]
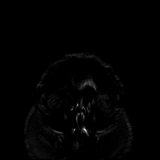

[Series 9: DWI · coronal · 5.0mm · 1.44mm/px · 3 of 32 slices shown (4 of 4)]
[im 1/32]
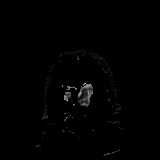
[im 16/32]
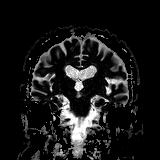
[im 32/32]
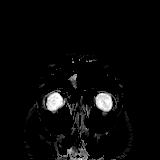

[Series 10: T2 · axial · 4.0mm · 0.36mm/px · z∈[-54,+79]mm · 2 of 27 slices shown (1 of 2)]
[im 1/27]
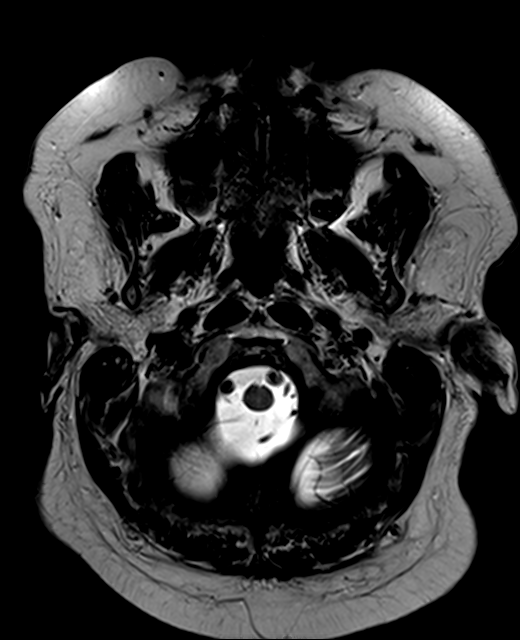
[im 27/27]
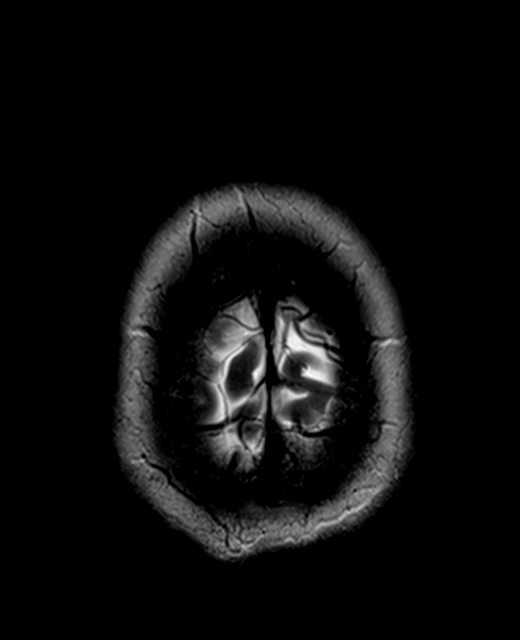

[Series 11: FLAIR · axial · 3.0mm · 0.72mm/px · z∈[-60,+87]mm · 2 of 26 slices shown]
[im 1/26]
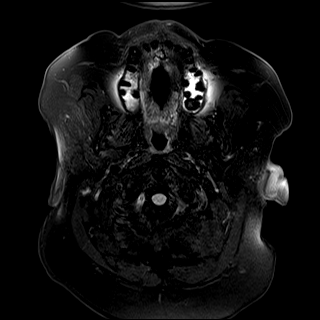
[im 26/26]
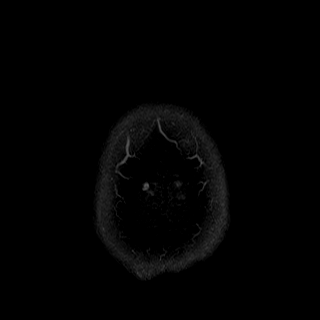

[Series 13: swi_images · axial · 1.5mm · 0.90mm/px · z∈[-57,+83]mm · 8 of 96 slices shown]
[im 1/96]
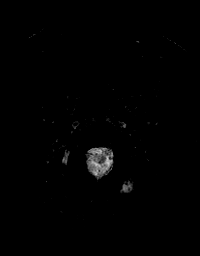
[im 14/96]
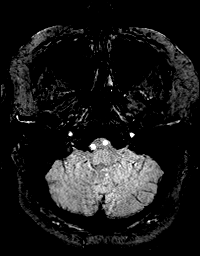
[im 28/96]
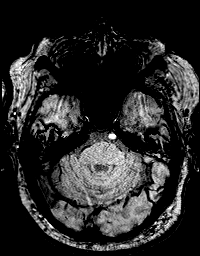
[im 41/96]
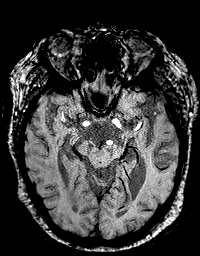
[im 55/96]
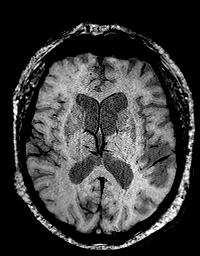
[im 68/96]
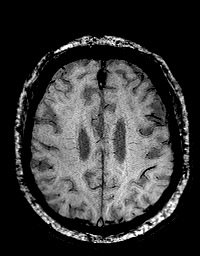
[im 82/96]
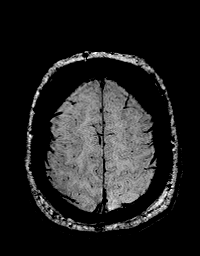
[im 96/96]
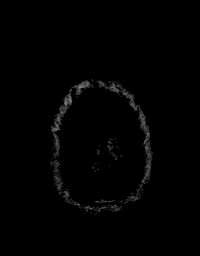

[Series 14: T1 · axial · 1.0mm · 0.94mm/px · z∈[-65,+92]mm · 13 of 160 slices shown (2 of 2)]
[im 1/160]
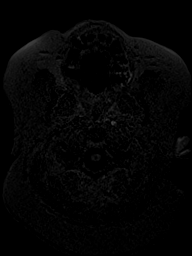
[im 14/160]
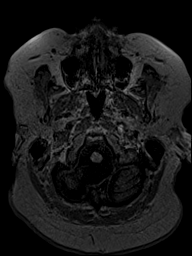
[im 27/160]
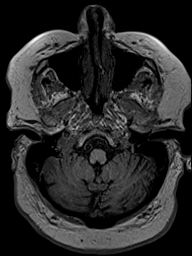
[im 40/160]
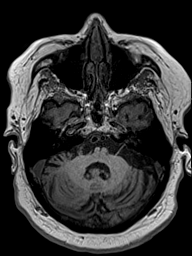
[im 54/160]
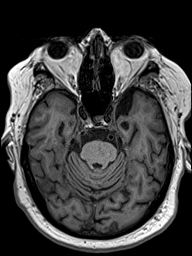
[im 67/160]
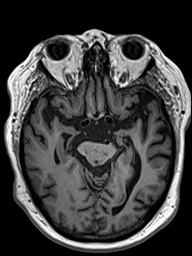
[im 80/160]
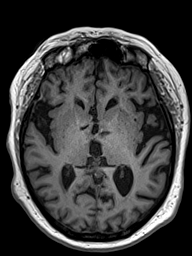
[im 93/160]
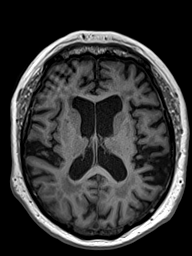
[im 107/160]
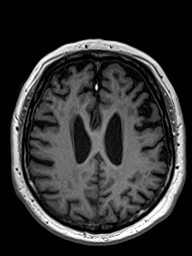
[im 120/160]
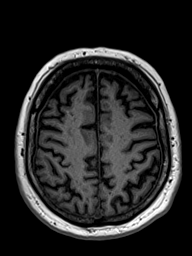
[im 133/160]
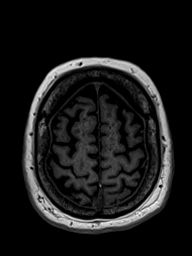
[im 146/160]
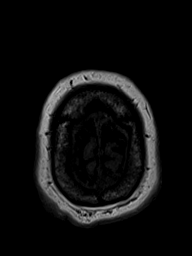
[im 160/160]
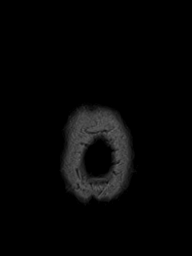

[Series 15: T2 · coronal · 4.5mm · 0.36mm/px · 2 of 30 slices shown (2 of 2)]
[im 1/30]
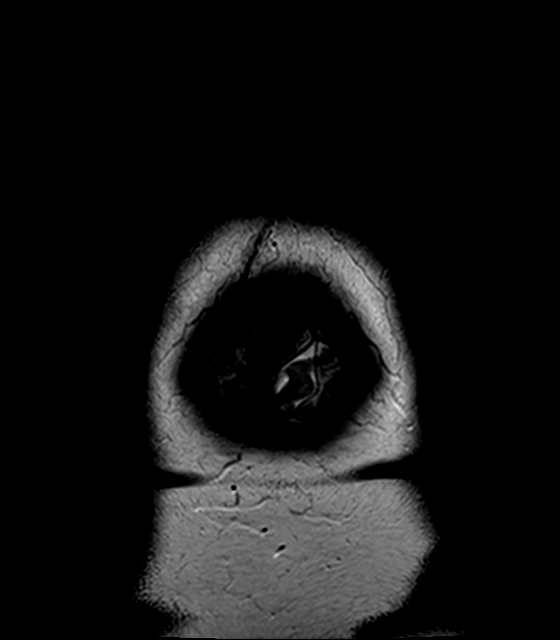
[im 30/30]
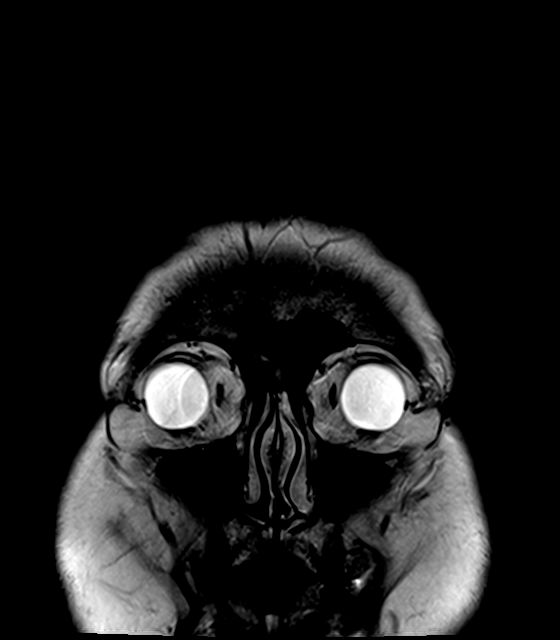

[48 of 48 positions shown; findings below may reference images not displayed]

FINDINGS: Brain: Generalized atrophy with relative frontal and temporal
predominance. Diffusion imaging does not show any acute or subacute
infarction. Mild chronic small-vessel ischemic changes affect pons.
No focal cerebellar finding. Cerebral hemispheres show moderate
chronic small-vessel ischemic change of the white matter. No
cortical or large vessel territory infarction. No mass lesion,
hemorrhage, hydrocephalus or extra-axial collection.

Vascular: Major vessels at the base of the brain show flow.

Skull and upper cervical spine: Negative

Sinuses/Orbits: Clear/normal

Other: None
IMPRESSION: No acute or reversible finding. Generalized atrophy with relative
frontal and temporal predominance. Moderate chronic small-vessel
ischemic changes of the cerebral hemispheric white matter.
